# Patient Record
Sex: Female | Born: 1940
Health system: Southern US, Community
[De-identification: ages and names within clinical notes are randomized; demographics above are authoritative.]

## PROBLEM LIST (undated history)

## (undated) DIAGNOSIS — K7689 Other specified diseases of liver: Secondary | ICD-10-CM

## (undated) DIAGNOSIS — H269 Unspecified cataract: Secondary | ICD-10-CM

## (undated) DIAGNOSIS — S72001A Fracture of unspecified part of neck of right femur, initial encounter for closed fracture: Secondary | ICD-10-CM

## (undated) DIAGNOSIS — C801 Malignant (primary) neoplasm, unspecified: Secondary | ICD-10-CM

## (undated) DIAGNOSIS — Z9289 Personal history of other medical treatment: Secondary | ICD-10-CM

## (undated) DIAGNOSIS — T7840XA Allergy, unspecified, initial encounter: Secondary | ICD-10-CM

## (undated) DIAGNOSIS — I2510 Atherosclerotic heart disease of native coronary artery without angina pectoris: Secondary | ICD-10-CM

## (undated) DIAGNOSIS — I201 Angina pectoris with documented spasm: Secondary | ICD-10-CM

## (undated) DIAGNOSIS — Z9889 Other specified postprocedural states: Secondary | ICD-10-CM

## (undated) DIAGNOSIS — E785 Hyperlipidemia, unspecified: Secondary | ICD-10-CM

## (undated) DIAGNOSIS — I10 Essential (primary) hypertension: Secondary | ICD-10-CM

## (undated) DIAGNOSIS — K589 Irritable bowel syndrome without diarrhea: Secondary | ICD-10-CM

## (undated) DIAGNOSIS — F419 Anxiety disorder, unspecified: Secondary | ICD-10-CM

## (undated) DIAGNOSIS — G4700 Insomnia, unspecified: Secondary | ICD-10-CM

## (undated) DIAGNOSIS — K5730 Diverticulosis of large intestine without perforation or abscess without bleeding: Secondary | ICD-10-CM

## (undated) DIAGNOSIS — G373 Acute transverse myelitis in demyelinating disease of central nervous system: Secondary | ICD-10-CM

## (undated) DIAGNOSIS — K219 Gastro-esophageal reflux disease without esophagitis: Secondary | ICD-10-CM

## (undated) HISTORY — DX: Gastro-esophageal reflux disease without esophagitis: K21.9

## (undated) HISTORY — DX: Personal history of other medical treatment: Z92.89

## (undated) HISTORY — PX: COLONOSCOPY: SHX174

## (undated) HISTORY — DX: Essential (primary) hypertension: I10

## (undated) HISTORY — DX: Irritable bowel syndrome, unspecified: K58.9

## (undated) HISTORY — DX: Fracture of unspecified part of neck of right femur, initial encounter for closed fracture: S72.001A

## (undated) HISTORY — DX: Unspecified cataract: H26.9

## (undated) HISTORY — PX: UPPER GASTROINTESTINAL ENDOSCOPY: SHX188

## (undated) HISTORY — DX: Anxiety disorder, unspecified: F41.9

## (undated) HISTORY — DX: Angina pectoris with documented spasm: I20.1

## (undated) HISTORY — PX: LYSIS OF ADHESION: SHX5961

## (undated) HISTORY — DX: Allergy, unspecified, initial encounter: T78.40XA

## (undated) HISTORY — PX: ABDOMINAL HYSTERECTOMY: SHX81

## (undated) HISTORY — DX: Malignant (primary) neoplasm, unspecified: C80.1

## (undated) HISTORY — DX: Diverticulosis of large intestine without perforation or abscess without bleeding: K57.30

## (undated) HISTORY — DX: Other specified diseases of liver: K76.89

## (undated) HISTORY — DX: Atherosclerotic heart disease of native coronary artery without angina pectoris: I25.10

## (undated) HISTORY — DX: Hyperlipidemia, unspecified: E78.5

## (undated) HISTORY — DX: Insomnia, unspecified: G47.00

## (undated) HISTORY — PX: CARDIAC CATHETERIZATION: SHX172

## (undated) HISTORY — PX: SALPINGOOPHORECTOMY: SHX82

## (undated) HISTORY — DX: Acute transverse myelitis in demyelinating disease of central nervous system: G37.3

## (undated) HISTORY — PX: DIAGNOSTIC LAPAROSCOPY: SUR761

## (undated) HISTORY — DX: Other specified postprocedural states: Z98.890

---

## 1990-02-06 HISTORY — PX: PELVIC LAPAROSCOPY: SHX162

## 1998-08-12 ENCOUNTER — Other Ambulatory Visit: Admission: RE | Admit: 1998-08-12 | Discharge: 1998-08-12 | Payer: Self-pay | Admitting: Obstetrics and Gynecology

## 2000-11-06 ENCOUNTER — Encounter: Payer: Self-pay | Admitting: Emergency Medicine

## 2000-11-06 ENCOUNTER — Observation Stay (HOSPITAL_COMMUNITY): Admission: EM | Admit: 2000-11-06 | Discharge: 2000-11-07 | Payer: Self-pay | Admitting: Emergency Medicine

## 2001-01-31 ENCOUNTER — Observation Stay (HOSPITAL_COMMUNITY): Admission: EM | Admit: 2001-01-31 | Discharge: 2001-02-01 | Payer: Self-pay | Admitting: Emergency Medicine

## 2001-01-31 ENCOUNTER — Encounter: Payer: Self-pay | Admitting: *Deleted

## 2002-02-06 HISTORY — PX: RESECTION SOFT TISSUE TUMOR LEG / ANKLE RADICAL: SUR1250

## 2002-09-18 ENCOUNTER — Encounter: Payer: Self-pay | Admitting: Gynecology

## 2002-09-18 ENCOUNTER — Ambulatory Visit (HOSPITAL_COMMUNITY): Admission: RE | Admit: 2002-09-18 | Discharge: 2002-09-18 | Payer: Self-pay | Admitting: *Deleted

## 2002-11-07 ENCOUNTER — Encounter: Payer: Self-pay | Admitting: Gynecology

## 2002-11-07 ENCOUNTER — Ambulatory Visit (HOSPITAL_COMMUNITY): Admission: RE | Admit: 2002-11-07 | Discharge: 2002-11-07 | Payer: Self-pay | Admitting: Gynecology

## 2002-11-07 ENCOUNTER — Other Ambulatory Visit: Admission: RE | Admit: 2002-11-07 | Discharge: 2002-11-07 | Payer: Self-pay | Admitting: Gynecology

## 2002-11-10 ENCOUNTER — Encounter: Payer: Self-pay | Admitting: Gynecology

## 2002-11-10 ENCOUNTER — Ambulatory Visit (HOSPITAL_COMMUNITY): Admission: RE | Admit: 2002-11-10 | Discharge: 2002-11-10 | Payer: Self-pay | Admitting: Gynecology

## 2002-11-18 ENCOUNTER — Ambulatory Visit: Admission: RE | Admit: 2002-11-18 | Discharge: 2002-12-10 | Payer: Self-pay | Admitting: Radiation Oncology

## 2002-11-25 ENCOUNTER — Ambulatory Visit (HOSPITAL_COMMUNITY): Admission: RE | Admit: 2002-11-25 | Discharge: 2002-11-25 | Payer: Self-pay | Admitting: Hematology & Oncology

## 2002-11-25 ENCOUNTER — Encounter: Payer: Self-pay | Admitting: Hematology & Oncology

## 2002-11-26 ENCOUNTER — Encounter: Payer: Self-pay | Admitting: Hematology & Oncology

## 2002-11-26 ENCOUNTER — Ambulatory Visit (HOSPITAL_COMMUNITY): Admission: RE | Admit: 2002-11-26 | Discharge: 2002-11-26 | Payer: Self-pay | Admitting: Hematology & Oncology

## 2002-12-29 ENCOUNTER — Encounter: Payer: Self-pay | Admitting: Gastroenterology

## 2003-01-06 ENCOUNTER — Ambulatory Visit: Admission: RE | Admit: 2003-01-06 | Discharge: 2003-01-06 | Payer: Self-pay | Admitting: Radiation Oncology

## 2003-02-24 ENCOUNTER — Ambulatory Visit (HOSPITAL_COMMUNITY): Admission: RE | Admit: 2003-02-24 | Discharge: 2003-02-24 | Payer: Self-pay | Admitting: Hematology & Oncology

## 2003-03-18 ENCOUNTER — Emergency Department (HOSPITAL_COMMUNITY): Admission: EM | Admit: 2003-03-18 | Discharge: 2003-03-18 | Payer: Self-pay | Admitting: Family Medicine

## 2003-03-18 ENCOUNTER — Ambulatory Visit (HOSPITAL_COMMUNITY): Admission: RE | Admit: 2003-03-18 | Discharge: 2003-03-18 | Payer: Self-pay | Admitting: General Surgery

## 2003-03-18 ENCOUNTER — Encounter (INDEPENDENT_AMBULATORY_CARE_PROVIDER_SITE_OTHER): Payer: Self-pay | Admitting: Specialist

## 2003-03-18 ENCOUNTER — Ambulatory Visit (HOSPITAL_BASED_OUTPATIENT_CLINIC_OR_DEPARTMENT_OTHER): Admission: RE | Admit: 2003-03-18 | Discharge: 2003-03-18 | Payer: Self-pay | Admitting: General Surgery

## 2003-05-21 ENCOUNTER — Ambulatory Visit (HOSPITAL_COMMUNITY): Admission: RE | Admit: 2003-05-21 | Discharge: 2003-05-21 | Payer: Self-pay | Admitting: Hematology & Oncology

## 2003-08-24 ENCOUNTER — Ambulatory Visit (HOSPITAL_COMMUNITY): Admission: RE | Admit: 2003-08-24 | Discharge: 2003-08-24 | Payer: Self-pay | Admitting: Oncology

## 2003-10-20 ENCOUNTER — Ambulatory Visit (HOSPITAL_COMMUNITY): Admission: RE | Admit: 2003-10-20 | Discharge: 2003-10-20 | Payer: Self-pay | Admitting: Gynecology

## 2003-11-10 ENCOUNTER — Other Ambulatory Visit: Admission: RE | Admit: 2003-11-10 | Discharge: 2003-11-10 | Payer: Self-pay | Admitting: Gynecology

## 2003-11-27 ENCOUNTER — Ambulatory Visit (HOSPITAL_COMMUNITY): Admission: RE | Admit: 2003-11-27 | Discharge: 2003-11-27 | Payer: Self-pay | Admitting: Hematology & Oncology

## 2003-12-10 ENCOUNTER — Ambulatory Visit (HOSPITAL_COMMUNITY): Admission: RE | Admit: 2003-12-10 | Discharge: 2003-12-10 | Payer: Self-pay | Admitting: Hematology & Oncology

## 2003-12-28 ENCOUNTER — Ambulatory Visit: Payer: Self-pay | Admitting: Hematology & Oncology

## 2004-03-30 ENCOUNTER — Ambulatory Visit (HOSPITAL_COMMUNITY): Admission: RE | Admit: 2004-03-30 | Discharge: 2004-03-30 | Payer: Self-pay | Admitting: Hematology & Oncology

## 2004-04-22 ENCOUNTER — Ambulatory Visit: Payer: Self-pay | Admitting: Hematology & Oncology

## 2004-08-17 ENCOUNTER — Ambulatory Visit: Payer: Self-pay | Admitting: Hematology & Oncology

## 2004-08-22 ENCOUNTER — Ambulatory Visit (HOSPITAL_COMMUNITY): Admission: RE | Admit: 2004-08-22 | Discharge: 2004-08-22 | Payer: Self-pay | Admitting: Hematology & Oncology

## 2004-10-21 ENCOUNTER — Ambulatory Visit: Payer: Self-pay | Admitting: Hematology & Oncology

## 2004-11-01 ENCOUNTER — Ambulatory Visit (HOSPITAL_COMMUNITY): Admission: RE | Admit: 2004-11-01 | Discharge: 2004-11-01 | Payer: Self-pay | Admitting: Gynecology

## 2004-11-04 ENCOUNTER — Encounter: Admission: RE | Admit: 2004-11-04 | Discharge: 2004-11-04 | Payer: Self-pay | Admitting: Gynecology

## 2004-11-14 ENCOUNTER — Other Ambulatory Visit: Admission: RE | Admit: 2004-11-14 | Discharge: 2004-11-14 | Payer: Self-pay | Admitting: Gynecology

## 2004-12-11 ENCOUNTER — Emergency Department (HOSPITAL_COMMUNITY): Admission: EM | Admit: 2004-12-11 | Discharge: 2004-12-11 | Payer: Self-pay | Admitting: Emergency Medicine

## 2004-12-16 ENCOUNTER — Emergency Department (HOSPITAL_COMMUNITY): Admission: EM | Admit: 2004-12-16 | Discharge: 2004-12-17 | Payer: Self-pay | Admitting: Emergency Medicine

## 2004-12-19 ENCOUNTER — Ambulatory Visit (HOSPITAL_COMMUNITY): Admission: RE | Admit: 2004-12-19 | Discharge: 2004-12-19 | Payer: Self-pay | Admitting: Emergency Medicine

## 2004-12-22 ENCOUNTER — Ambulatory Visit (HOSPITAL_COMMUNITY): Admission: RE | Admit: 2004-12-22 | Discharge: 2004-12-22 | Payer: Self-pay | Admitting: Hematology & Oncology

## 2005-02-23 ENCOUNTER — Ambulatory Visit: Payer: Self-pay | Admitting: Hematology & Oncology

## 2005-03-13 ENCOUNTER — Ambulatory Visit (HOSPITAL_COMMUNITY): Admission: RE | Admit: 2005-03-13 | Discharge: 2005-03-13 | Payer: Self-pay | Admitting: Hematology & Oncology

## 2005-06-01 ENCOUNTER — Ambulatory Visit: Payer: Self-pay | Admitting: Gastroenterology

## 2005-06-16 ENCOUNTER — Ambulatory Visit: Payer: Self-pay | Admitting: Hematology & Oncology

## 2005-06-20 LAB — CBC WITH DIFFERENTIAL/PLATELET
BASO%: 1 % (ref 0.0–2.0)
Basophils Absolute: 0 10*3/uL (ref 0.0–0.1)
EOS%: 4.4 % (ref 0.0–7.0)
Eosinophils Absolute: 0.2 10*3/uL (ref 0.0–0.5)
HCT: 35.7 % (ref 34.8–46.6)
HGB: 11.8 g/dL (ref 11.6–15.9)
LYMPH%: 34.6 % (ref 14.0–48.0)
MCH: 31.3 pg (ref 26.0–34.0)
MCHC: 33 g/dL (ref 32.0–36.0)
MCV: 94.9 fL (ref 81.0–101.0)
MONO#: 0.3 10*3/uL (ref 0.1–0.9)
MONO%: 6.2 % (ref 0.0–13.0)
NEUT#: 2.7 10*3/uL (ref 1.5–6.5)
NEUT%: 53.8 % (ref 39.6–76.8)
Platelets: 461 10*3/uL — ABNORMAL HIGH (ref 145–400)
RBC: 3.76 10*6/uL (ref 3.70–5.32)
RDW: 14.3 % (ref 11.3–14.5)
WBC: 4.9 10*3/uL (ref 3.9–10.0)
lymph#: 1.7 10*3/uL (ref 0.9–3.3)

## 2005-06-20 LAB — COMPREHENSIVE METABOLIC PANEL
ALT: 10 U/L (ref 0–40)
AST: 17 U/L (ref 0–37)
Albumin: 4.1 g/dL (ref 3.5–5.2)
Alkaline Phosphatase: 67 U/L (ref 39–117)
BUN: 8 mg/dL (ref 6–23)
CO2: 28 mEq/L (ref 19–32)
Calcium: 9.6 mg/dL (ref 8.4–10.5)
Chloride: 106 mEq/L (ref 96–112)
Creatinine, Ser: 0.9 mg/dL (ref 0.4–1.2)
Glucose, Bld: 92 mg/dL (ref 70–99)
Potassium: 5 mEq/L (ref 3.5–5.3)
Sodium: 141 mEq/L (ref 135–145)
Total Bilirubin: 0.3 mg/dL (ref 0.3–1.2)
Total Protein: 7.3 g/dL (ref 6.0–8.3)

## 2005-06-20 LAB — CEA: CEA: 0.7 ng/mL (ref 0.0–5.0)

## 2005-06-20 LAB — CA 125: CA 125: 8.8 U/mL (ref 0.0–30.2)

## 2005-07-26 ENCOUNTER — Ambulatory Visit: Payer: Self-pay | Admitting: Gastroenterology

## 2005-07-28 ENCOUNTER — Encounter (INDEPENDENT_AMBULATORY_CARE_PROVIDER_SITE_OTHER): Payer: Self-pay | Admitting: Specialist

## 2005-07-28 ENCOUNTER — Ambulatory Visit: Payer: Self-pay | Admitting: Gastroenterology

## 2005-08-08 ENCOUNTER — Ambulatory Visit: Payer: Self-pay | Admitting: Gastroenterology

## 2005-09-07 ENCOUNTER — Ambulatory Visit: Payer: Self-pay | Admitting: Hematology & Oncology

## 2005-09-11 ENCOUNTER — Ambulatory Visit (HOSPITAL_COMMUNITY): Admission: RE | Admit: 2005-09-11 | Discharge: 2005-09-11 | Payer: Self-pay | Admitting: Hematology & Oncology

## 2005-11-02 ENCOUNTER — Encounter: Admission: RE | Admit: 2005-11-02 | Discharge: 2005-11-02 | Payer: Self-pay | Admitting: Gynecology

## 2005-11-09 ENCOUNTER — Encounter: Admission: RE | Admit: 2005-11-09 | Discharge: 2005-11-09 | Payer: Self-pay | Admitting: Gynecology

## 2005-11-16 ENCOUNTER — Other Ambulatory Visit: Admission: RE | Admit: 2005-11-16 | Discharge: 2005-11-16 | Payer: Self-pay | Admitting: Gynecology

## 2005-12-08 ENCOUNTER — Ambulatory Visit (HOSPITAL_COMMUNITY): Admission: RE | Admit: 2005-12-08 | Discharge: 2005-12-08 | Payer: Self-pay | Admitting: Gynecology

## 2005-12-20 ENCOUNTER — Ambulatory Visit: Admission: RE | Admit: 2005-12-20 | Discharge: 2005-12-20 | Payer: Self-pay | Admitting: Gynecologic Oncology

## 2006-01-02 ENCOUNTER — Ambulatory Visit (HOSPITAL_COMMUNITY): Admission: RE | Admit: 2006-01-02 | Discharge: 2006-01-02 | Payer: Self-pay | Admitting: Gynecologic Oncology

## 2006-01-02 ENCOUNTER — Encounter (INDEPENDENT_AMBULATORY_CARE_PROVIDER_SITE_OTHER): Payer: Self-pay | Admitting: Specialist

## 2006-03-07 ENCOUNTER — Ambulatory Visit: Payer: Self-pay | Admitting: Hematology & Oncology

## 2006-03-12 LAB — COMPREHENSIVE METABOLIC PANEL
ALT: 16 U/L (ref 0–35)
AST: 18 U/L (ref 0–37)
Albumin: 4.1 g/dL (ref 3.5–5.2)
Alkaline Phosphatase: 94 U/L (ref 39–117)
BUN: 9 mg/dL (ref 6–23)
CO2: 28 mEq/L (ref 19–32)
Calcium: 10.1 mg/dL (ref 8.4–10.5)
Chloride: 103 mEq/L (ref 96–112)
Creatinine, Ser: 0.95 mg/dL (ref 0.40–1.20)
Glucose, Bld: 98 mg/dL (ref 70–99)
Potassium: 4.4 mEq/L (ref 3.5–5.3)
Sodium: 137 mEq/L (ref 135–145)
Total Bilirubin: 0.2 mg/dL — ABNORMAL LOW (ref 0.3–1.2)
Total Protein: 7.3 g/dL (ref 6.0–8.3)

## 2006-03-12 LAB — CBC WITH DIFFERENTIAL/PLATELET
BASO%: 0.9 % (ref 0.0–2.0)
Basophils Absolute: 0 10*3/uL (ref 0.0–0.1)
EOS%: 4.9 % (ref 0.0–7.0)
Eosinophils Absolute: 0.3 10*3/uL (ref 0.0–0.5)
HCT: 33.5 % — ABNORMAL LOW (ref 34.8–46.6)
HGB: 11.5 g/dL — ABNORMAL LOW (ref 11.6–15.9)
LYMPH%: 49.1 % — ABNORMAL HIGH (ref 14.0–48.0)
MCH: 32 pg (ref 26.0–34.0)
MCHC: 34.3 g/dL (ref 32.0–36.0)
MCV: 93.2 fL (ref 81.0–101.0)
MONO#: 0.5 10*3/uL (ref 0.1–0.9)
MONO%: 10.2 % (ref 0.0–13.0)
NEUT#: 1.9 10*3/uL (ref 1.5–6.5)
NEUT%: 34.9 % — ABNORMAL LOW (ref 39.6–76.8)
Platelets: 406 10*3/uL — ABNORMAL HIGH (ref 145–400)
RBC: 3.6 10*6/uL — ABNORMAL LOW (ref 3.70–5.32)
RDW: 14.7 % — ABNORMAL HIGH (ref 11.3–14.5)
WBC: 5.4 10*3/uL (ref 3.9–10.0)
lymph#: 2.6 10*3/uL (ref 0.9–3.3)

## 2006-03-15 ENCOUNTER — Ambulatory Visit (HOSPITAL_COMMUNITY): Admission: RE | Admit: 2006-03-15 | Discharge: 2006-03-15 | Payer: Self-pay | Admitting: Hematology & Oncology

## 2006-05-08 ENCOUNTER — Ambulatory Visit: Payer: Self-pay | Admitting: Gastroenterology

## 2006-06-08 ENCOUNTER — Ambulatory Visit: Payer: Self-pay | Admitting: Gastroenterology

## 2006-06-08 LAB — CONVERTED CEMR LAB
Folate: >20 ng/mL
Vitamin B-12: 362 pg/mL (ref 211–911)

## 2006-09-05 ENCOUNTER — Ambulatory Visit: Payer: Self-pay | Admitting: Hematology & Oncology

## 2006-09-07 LAB — CBC WITH DIFFERENTIAL/PLATELET
BASO%: 0.6 % (ref 0.0–2.0)
Basophils Absolute: 0 10*3/uL (ref 0.0–0.1)
EOS%: 9.4 % — ABNORMAL HIGH (ref 0.0–7.0)
Eosinophils Absolute: 0.6 10*3/uL — ABNORMAL HIGH (ref 0.0–0.5)
HCT: 33.2 % — ABNORMAL LOW (ref 34.8–46.6)
HGB: 11.4 g/dL — ABNORMAL LOW (ref 11.6–15.9)
LYMPH%: 44 % (ref 14.0–48.0)
MCH: 31.7 pg (ref 26.0–34.0)
MCHC: 34.5 g/dL (ref 32.0–36.0)
MCV: 91.8 fL (ref 81.0–101.0)
MONO#: 0.6 10*3/uL (ref 0.1–0.9)
MONO%: 9.7 % (ref 0.0–13.0)
NEUT#: 2.2 10*3/uL (ref 1.5–6.5)
NEUT%: 36.3 % — ABNORMAL LOW (ref 39.6–76.8)
Platelets: 430 10*3/uL — ABNORMAL HIGH (ref 145–400)
RBC: 3.61 10*6/uL — ABNORMAL LOW (ref 3.70–5.32)
RDW: 13.9 % (ref 11.3–14.5)
WBC: 6.2 10*3/uL (ref 3.9–10.0)
lymph#: 2.7 10*3/uL (ref 0.9–3.3)

## 2006-09-07 LAB — COMPREHENSIVE METABOLIC PANEL
ALT: 13 U/L (ref 0–35)
AST: 18 U/L (ref 0–37)
Albumin: 4.1 g/dL (ref 3.5–5.2)
Alkaline Phosphatase: 110 U/L (ref 39–117)
BUN: 12 mg/dL (ref 6–23)
CO2: 25 mEq/L (ref 19–32)
Calcium: 9.5 mg/dL (ref 8.4–10.5)
Chloride: 103 mEq/L (ref 96–112)
Creatinine, Ser: 0.99 mg/dL (ref 0.40–1.20)
Glucose, Bld: 63 mg/dL — ABNORMAL LOW (ref 70–99)
Potassium: 4.4 mEq/L (ref 3.5–5.3)
Sodium: 139 mEq/L (ref 135–145)
Total Bilirubin: 0.3 mg/dL (ref 0.3–1.2)
Total Protein: 7.4 g/dL (ref 6.0–8.3)

## 2006-09-07 LAB — CANCER ANTIGEN 27.29: CA 27.29: 12 U/mL (ref 0–39)

## 2006-09-07 LAB — CA 125: CA 125: 9.7 U/mL (ref 0.0–30.2)

## 2006-09-10 ENCOUNTER — Encounter (INDEPENDENT_AMBULATORY_CARE_PROVIDER_SITE_OTHER): Payer: Self-pay | Admitting: *Deleted

## 2006-09-10 ENCOUNTER — Ambulatory Visit (HOSPITAL_COMMUNITY): Admission: RE | Admit: 2006-09-10 | Discharge: 2006-09-10 | Payer: Self-pay | Admitting: Hematology & Oncology

## 2006-10-02 ENCOUNTER — Ambulatory Visit (HOSPITAL_COMMUNITY): Admission: RE | Admit: 2006-10-02 | Discharge: 2006-10-02 | Payer: Self-pay | Admitting: Hematology & Oncology

## 2006-11-19 ENCOUNTER — Other Ambulatory Visit: Admission: RE | Admit: 2006-11-19 | Discharge: 2006-11-19 | Payer: Self-pay | Admitting: Gynecology

## 2006-12-04 ENCOUNTER — Encounter: Admission: RE | Admit: 2006-12-04 | Discharge: 2006-12-04 | Payer: Self-pay | Admitting: Gynecology

## 2007-03-18 ENCOUNTER — Ambulatory Visit: Payer: Self-pay | Admitting: Hematology & Oncology

## 2007-03-20 LAB — COMPREHENSIVE METABOLIC PANEL
ALT: 14 U/L (ref 0–35)
AST: 16 U/L (ref 0–37)
Albumin: 4.3 g/dL (ref 3.5–5.2)
Alkaline Phosphatase: 127 U/L — ABNORMAL HIGH (ref 39–117)
BUN: 10 mg/dL (ref 6–23)
CO2: 28 mEq/L (ref 19–32)
Calcium: 10 mg/dL (ref 8.4–10.5)
Chloride: 103 mEq/L (ref 96–112)
Creatinine, Ser: 1.05 mg/dL (ref 0.40–1.20)
Glucose, Bld: 74 mg/dL (ref 70–99)
Potassium: 4.3 mEq/L (ref 3.5–5.3)
Sodium: 140 mEq/L (ref 135–145)
Total Bilirubin: 0.4 mg/dL (ref 0.3–1.2)
Total Protein: 7.6 g/dL (ref 6.0–8.3)

## 2007-03-20 LAB — CBC WITH DIFFERENTIAL/PLATELET
BASO%: 0.7 % (ref 0.0–2.0)
Basophils Absolute: 0 10*3/uL (ref 0.0–0.1)
EOS%: 5.8 % (ref 0.0–7.0)
Eosinophils Absolute: 0.4 10*3/uL (ref 0.0–0.5)
HCT: 34.3 % — ABNORMAL LOW (ref 34.8–46.6)
HGB: 11.7 g/dL (ref 11.6–15.9)
LYMPH%: 37 % (ref 14.0–48.0)
MCH: 31.5 pg (ref 26.0–34.0)
MCHC: 34.2 g/dL (ref 32.0–36.0)
MCV: 92.2 fL (ref 81.0–101.0)
MONO#: 0.5 10*3/uL (ref 0.1–0.9)
MONO%: 7.8 % (ref 0.0–13.0)
NEUT#: 3 10*3/uL (ref 1.5–6.5)
NEUT%: 48.7 % (ref 39.6–76.8)
Platelets: 442 10*3/uL — ABNORMAL HIGH (ref 145–400)
RBC: 3.72 10*6/uL (ref 3.70–5.32)
RDW: 14.1 % (ref 11.3–14.5)
WBC: 6.1 10*3/uL (ref 3.9–10.0)
lymph#: 2.3 10*3/uL (ref 0.9–3.3)

## 2007-08-16 ENCOUNTER — Ambulatory Visit: Payer: Self-pay | Admitting: Hematology & Oncology

## 2007-08-16 LAB — BASIC METABOLIC PANEL
BUN: 15 mg/dL (ref 6–23)
CO2: 28 mEq/L (ref 19–32)
Calcium: 9.7 mg/dL (ref 8.4–10.5)
Chloride: 104 mEq/L (ref 96–112)
Creatinine, Ser: 0.97 mg/dL (ref 0.40–1.20)
Glucose, Bld: 92 mg/dL (ref 70–99)
Potassium: 4 mEq/L (ref 3.5–5.3)
Sodium: 141 mEq/L (ref 135–145)

## 2007-08-19 ENCOUNTER — Ambulatory Visit (HOSPITAL_COMMUNITY): Admission: RE | Admit: 2007-08-19 | Discharge: 2007-08-19 | Payer: Self-pay | Admitting: Hematology & Oncology

## 2007-10-23 ENCOUNTER — Ambulatory Visit: Payer: Self-pay | Admitting: Hematology & Oncology

## 2007-10-24 ENCOUNTER — Encounter: Payer: Self-pay | Admitting: Gastroenterology

## 2007-10-24 LAB — COMPREHENSIVE METABOLIC PANEL
ALT: 22 U/L (ref 0–35)
AST: 25 U/L (ref 0–37)
Albumin: 4.2 g/dL (ref 3.5–5.2)
Alkaline Phosphatase: 136 U/L — ABNORMAL HIGH (ref 39–117)
BUN: 9 mg/dL (ref 6–23)
CO2: 28 mEq/L (ref 19–32)
Calcium: 9.8 mg/dL (ref 8.4–10.5)
Chloride: 102 mEq/L (ref 96–112)
Creatinine, Ser: 0.97 mg/dL (ref 0.40–1.20)
Glucose, Bld: 92 mg/dL (ref 70–99)
Potassium: 4.2 mEq/L (ref 3.5–5.3)
Sodium: 137 mEq/L (ref 135–145)
Total Bilirubin: 0.3 mg/dL (ref 0.3–1.2)
Total Protein: 7.7 g/dL (ref 6.0–8.3)

## 2007-10-24 LAB — CBC WITH DIFFERENTIAL (CANCER CENTER ONLY)
BASO#: 0 10*3/uL (ref 0.0–0.2)
BASO%: 0.7 % (ref 0.0–2.0)
EOS%: 3.4 % (ref 0.0–7.0)
Eosinophils Absolute: 0.2 10*3/uL (ref 0.0–0.5)
HCT: 35.2 % (ref 34.8–46.6)
HGB: 11.9 g/dL (ref 11.6–15.9)
LYMPH#: 2.1 10*3/uL (ref 0.9–3.3)
LYMPH%: 37.8 % (ref 14.0–48.0)
MCH: 30 pg (ref 26.0–34.0)
MCHC: 33.7 g/dL (ref 32.0–36.0)
MCV: 89 fL (ref 81–101)
MONO#: 0.6 10*3/uL (ref 0.1–0.9)
MONO%: 10.7 % (ref 0.0–13.0)
NEUT#: 2.6 10*3/uL (ref 1.5–6.5)
NEUT%: 47.4 % (ref 39.6–80.0)
Platelets: 405 10*3/uL — ABNORMAL HIGH (ref 145–400)
RBC: 3.95 10*6/uL (ref 3.70–5.32)
RDW: 12.9 % (ref 10.5–14.6)
WBC: 5.5 10*3/uL (ref 3.9–10.0)

## 2007-11-21 ENCOUNTER — Other Ambulatory Visit: Admission: RE | Admit: 2007-11-21 | Discharge: 2007-11-21 | Payer: Self-pay | Admitting: Gynecology

## 2007-11-21 ENCOUNTER — Ambulatory Visit: Payer: Self-pay | Admitting: Gynecology

## 2007-11-21 ENCOUNTER — Encounter: Payer: Self-pay | Admitting: Gynecology

## 2007-12-05 ENCOUNTER — Encounter: Admission: RE | Admit: 2007-12-05 | Discharge: 2007-12-05 | Payer: Self-pay | Admitting: Gynecology

## 2008-04-27 ENCOUNTER — Ambulatory Visit: Payer: Self-pay | Admitting: Hematology & Oncology

## 2008-05-07 ENCOUNTER — Encounter: Payer: Self-pay | Admitting: Gastroenterology

## 2008-05-07 LAB — COMPREHENSIVE METABOLIC PANEL
ALT: 13 U/L (ref 0–35)
AST: 18 U/L (ref 0–37)
Albumin: 4.3 g/dL (ref 3.5–5.2)
Alkaline Phosphatase: 116 U/L (ref 39–117)
BUN: 9 mg/dL (ref 6–23)
CO2: 28 mEq/L (ref 19–32)
Calcium: 9.6 mg/dL (ref 8.4–10.5)
Chloride: 100 mEq/L (ref 96–112)
Creatinine, Ser: 0.94 mg/dL (ref 0.40–1.20)
Glucose, Bld: 70 mg/dL (ref 70–99)
Potassium: 4.3 mEq/L (ref 3.5–5.3)
Sodium: 136 mEq/L (ref 135–145)
Total Bilirubin: 0.3 mg/dL (ref 0.3–1.2)
Total Protein: 7.6 g/dL (ref 6.0–8.3)

## 2008-05-07 LAB — CBC WITH DIFFERENTIAL (CANCER CENTER ONLY)
BASO#: 0 10*3/uL (ref 0.0–0.2)
BASO%: 0.6 % (ref 0.0–2.0)
EOS%: 7.9 % — ABNORMAL HIGH (ref 0.0–7.0)
Eosinophils Absolute: 0.4 10*3/uL (ref 0.0–0.5)
HCT: 36.4 % (ref 34.8–46.6)
HGB: 11.7 g/dL (ref 11.6–15.9)
LYMPH#: 2.8 10*3/uL (ref 0.9–3.3)
LYMPH%: 55.5 % — ABNORMAL HIGH (ref 14.0–48.0)
MCH: 30.2 pg (ref 26.0–34.0)
MCHC: 32.2 g/dL (ref 32.0–36.0)
MCV: 94 fL (ref 81–101)
MONO#: 0.3 10*3/uL (ref 0.1–0.9)
MONO%: 6.1 % (ref 0.0–13.0)
NEUT#: 1.5 10*3/uL (ref 1.5–6.5)
NEUT%: 29.9 % — ABNORMAL LOW (ref 39.6–80.0)
Platelets: 407 10*3/uL — ABNORMAL HIGH (ref 145–400)
RBC: 3.88 10*6/uL (ref 3.70–5.32)
RDW: 12.3 % (ref 10.5–14.6)
WBC: 5.1 10*3/uL (ref 3.9–10.0)

## 2008-05-07 LAB — CEA: CEA: 2.5 ng/mL (ref 0.0–5.0)

## 2008-07-26 ENCOUNTER — Encounter (INDEPENDENT_AMBULATORY_CARE_PROVIDER_SITE_OTHER): Payer: Self-pay | Admitting: Emergency Medicine

## 2008-07-26 ENCOUNTER — Ambulatory Visit: Payer: Self-pay | Admitting: *Deleted

## 2008-07-26 ENCOUNTER — Ambulatory Visit (HOSPITAL_COMMUNITY): Admission: RE | Admit: 2008-07-26 | Discharge: 2008-07-26 | Payer: Self-pay | Admitting: Emergency Medicine

## 2008-07-26 ENCOUNTER — Emergency Department (HOSPITAL_COMMUNITY): Admission: EM | Admit: 2008-07-26 | Discharge: 2008-07-26 | Payer: Self-pay | Admitting: Emergency Medicine

## 2008-08-07 ENCOUNTER — Encounter: Admission: RE | Admit: 2008-08-07 | Discharge: 2008-08-07 | Payer: Self-pay | Admitting: *Deleted

## 2008-11-05 ENCOUNTER — Ambulatory Visit: Payer: Self-pay | Admitting: Hematology & Oncology

## 2008-11-06 ENCOUNTER — Encounter: Payer: Self-pay | Admitting: Gastroenterology

## 2008-11-06 LAB — COMPREHENSIVE METABOLIC PANEL
ALT: 15 U/L (ref 0–35)
AST: 16 U/L (ref 0–37)
Albumin: 4.2 g/dL (ref 3.5–5.2)
Alkaline Phosphatase: 100 U/L (ref 39–117)
BUN: 9 mg/dL (ref 6–23)
CO2: 28 mEq/L (ref 19–32)
Calcium: 9.3 mg/dL (ref 8.4–10.5)
Chloride: 104 mEq/L (ref 96–112)
Creatinine, Ser: 0.99 mg/dL (ref 0.40–1.20)
Glucose, Bld: 82 mg/dL (ref 70–99)
Potassium: 4.4 mEq/L (ref 3.5–5.3)
Sodium: 139 mEq/L (ref 135–145)
Total Bilirubin: 0.4 mg/dL (ref 0.3–1.2)
Total Protein: 7.3 g/dL (ref 6.0–8.3)

## 2008-11-06 LAB — CBC WITH DIFFERENTIAL (CANCER CENTER ONLY)
BASO#: 0 10*3/uL (ref 0.0–0.2)
BASO%: 0.7 % (ref 0.0–2.0)
EOS%: 7.9 % — ABNORMAL HIGH (ref 0.0–7.0)
Eosinophils Absolute: 0.5 10*3/uL (ref 0.0–0.5)
HCT: 34 % — ABNORMAL LOW (ref 34.8–46.6)
HGB: 11.6 g/dL (ref 11.6–15.9)
LYMPH#: 2.6 10*3/uL (ref 0.9–3.3)
LYMPH%: 42.8 % (ref 14.0–48.0)
MCH: 31.8 pg (ref 26.0–34.0)
MCHC: 34.3 g/dL (ref 32.0–36.0)
MCV: 93 fL (ref 81–101)
MONO#: 0.4 10*3/uL (ref 0.1–0.9)
MONO%: 6.3 % (ref 0.0–13.0)
NEUT#: 2.5 10*3/uL (ref 1.5–6.5)
NEUT%: 42.3 % (ref 39.6–80.0)
Platelets: 422 10*3/uL — ABNORMAL HIGH (ref 145–400)
RBC: 3.66 10*6/uL — ABNORMAL LOW (ref 3.70–5.32)
RDW: 12 % (ref 10.5–14.6)
WBC: 6 10*3/uL (ref 3.9–10.0)

## 2008-11-06 LAB — LACTATE DEHYDROGENASE: LDH: 133 U/L (ref 94–250)

## 2008-11-23 ENCOUNTER — Encounter: Payer: Self-pay | Admitting: Gynecology

## 2008-11-23 ENCOUNTER — Other Ambulatory Visit: Admission: RE | Admit: 2008-11-23 | Discharge: 2008-11-23 | Payer: Self-pay | Admitting: Gynecology

## 2008-11-23 ENCOUNTER — Ambulatory Visit: Payer: Self-pay | Admitting: Gynecology

## 2008-12-01 ENCOUNTER — Ambulatory Visit: Payer: Self-pay | Admitting: Gynecology

## 2008-12-07 ENCOUNTER — Encounter: Admission: RE | Admit: 2008-12-07 | Discharge: 2008-12-07 | Payer: Self-pay | Admitting: Gynecology

## 2008-12-23 ENCOUNTER — Ambulatory Visit: Payer: Self-pay | Admitting: Gynecology

## 2009-01-19 ENCOUNTER — Ambulatory Visit: Payer: Self-pay | Admitting: Gynecology

## 2009-02-14 ENCOUNTER — Emergency Department (HOSPITAL_COMMUNITY): Admission: EM | Admit: 2009-02-14 | Discharge: 2009-02-15 | Payer: Self-pay | Admitting: Emergency Medicine

## 2009-02-22 ENCOUNTER — Ambulatory Visit (HOSPITAL_COMMUNITY): Admission: RE | Admit: 2009-02-22 | Discharge: 2009-02-22 | Payer: Self-pay | Admitting: Cardiovascular Disease

## 2009-02-24 ENCOUNTER — Ambulatory Visit (HOSPITAL_COMMUNITY): Admission: RE | Admit: 2009-02-24 | Discharge: 2009-02-24 | Payer: Self-pay | Admitting: Cardiovascular Disease

## 2009-05-06 ENCOUNTER — Ambulatory Visit: Payer: Self-pay | Admitting: Hematology & Oncology

## 2009-05-07 LAB — COMPREHENSIVE METABOLIC PANEL
ALT: 12 U/L (ref 0–35)
AST: 15 U/L (ref 0–37)
Albumin: 4.4 g/dL (ref 3.5–5.2)
Alkaline Phosphatase: 88 U/L (ref 39–117)
BUN: 13 mg/dL (ref 6–23)
CO2: 29 mEq/L (ref 19–32)
Calcium: 9.7 mg/dL (ref 8.4–10.5)
Chloride: 101 mEq/L (ref 96–112)
Creatinine, Ser: 0.98 mg/dL (ref 0.40–1.20)
Glucose, Bld: 91 mg/dL (ref 70–99)
Potassium: 4.5 mEq/L (ref 3.5–5.3)
Sodium: 139 mEq/L (ref 135–145)
Total Bilirubin: 0.3 mg/dL (ref 0.3–1.2)
Total Protein: 7.5 g/dL (ref 6.0–8.3)

## 2009-05-07 LAB — CBC WITH DIFFERENTIAL (CANCER CENTER ONLY)
BASO#: 0 10*3/uL (ref 0.0–0.2)
BASO%: 0.6 % (ref 0.0–2.0)
EOS%: 4.3 % (ref 0.0–7.0)
Eosinophils Absolute: 0.2 10*3/uL (ref 0.0–0.5)
HCT: 35.7 % (ref 34.8–46.6)
HGB: 11.8 g/dL (ref 11.6–15.9)
LYMPH#: 2.2 10*3/uL (ref 0.9–3.3)
LYMPH%: 44.3 % (ref 14.0–48.0)
MCH: 30.6 pg (ref 26.0–34.0)
MCHC: 33 g/dL (ref 32.0–36.0)
MCV: 93 fL (ref 81–101)
MONO#: 0.4 10*3/uL (ref 0.1–0.9)
MONO%: 7.1 % (ref 0.0–13.0)
NEUT#: 2.2 10*3/uL (ref 1.5–6.5)
NEUT%: 43.7 % (ref 39.6–80.0)
Platelets: 419 10*3/uL — ABNORMAL HIGH (ref 145–400)
RBC: 3.86 10*6/uL (ref 3.70–5.32)
RDW: 12.3 % (ref 10.5–14.6)
WBC: 5 10*3/uL (ref 3.9–10.0)

## 2009-05-07 LAB — VITAMIN D 25 HYDROXY (VIT D DEFICIENCY, FRACTURES): Vit D, 25-Hydroxy: 22 ng/mL — ABNORMAL LOW (ref 30–89)

## 2009-08-07 ENCOUNTER — Encounter (INDEPENDENT_AMBULATORY_CARE_PROVIDER_SITE_OTHER): Payer: Self-pay | Admitting: *Deleted

## 2009-11-10 ENCOUNTER — Ambulatory Visit: Payer: Self-pay | Admitting: Hematology & Oncology

## 2009-11-12 ENCOUNTER — Encounter: Payer: Self-pay | Admitting: Gastroenterology

## 2009-11-12 ENCOUNTER — Telehealth: Payer: Self-pay | Admitting: Gastroenterology

## 2009-11-12 LAB — CBC WITH DIFFERENTIAL (CANCER CENTER ONLY)
BASO#: 0 10*3/uL (ref 0.0–0.2)
BASO%: 0.5 % (ref 0.0–2.0)
EOS%: 2.2 % (ref 0.0–7.0)
Eosinophils Absolute: 0.1 10*3/uL (ref 0.0–0.5)
HCT: 35.8 % (ref 34.8–46.6)
HGB: 11.7 g/dL (ref 11.6–15.9)
LYMPH#: 2.5 10*3/uL (ref 0.9–3.3)
LYMPH%: 39.2 % (ref 14.0–48.0)
MCH: 30.4 pg (ref 26.0–34.0)
MCHC: 32.6 g/dL (ref 32.0–36.0)
MCV: 93 fL (ref 81–101)
MONO#: 0.5 10*3/uL (ref 0.1–0.9)
MONO%: 8 % (ref 0.0–13.0)
NEUT#: 3.2 10*3/uL (ref 1.5–6.5)
NEUT%: 50.1 % (ref 39.6–80.0)
Platelets: 485 10*3/uL — ABNORMAL HIGH (ref 145–400)
RBC: 3.84 10*6/uL (ref 3.70–5.32)
RDW: 12.3 % (ref 10.5–14.6)
WBC: 6.3 10*3/uL (ref 3.9–10.0)

## 2009-11-12 LAB — VITAMIN D 25 HYDROXY (VIT D DEFICIENCY, FRACTURES): Vit D, 25-Hydroxy: 48 ng/mL (ref 30–89)

## 2009-11-12 LAB — COMPREHENSIVE METABOLIC PANEL
ALT: 13 U/L (ref 0–35)
AST: 16 U/L (ref 0–37)
Albumin: 4.2 g/dL (ref 3.5–5.2)
Alkaline Phosphatase: 80 U/L (ref 39–117)
BUN: 11 mg/dL (ref 6–23)
CO2: 30 mEq/L (ref 19–32)
Calcium: 9.8 mg/dL (ref 8.4–10.5)
Chloride: 99 mEq/L (ref 96–112)
Creatinine, Ser: 1.01 mg/dL (ref 0.40–1.20)
Glucose, Bld: 81 mg/dL (ref 70–99)
Potassium: 4.2 mEq/L (ref 3.5–5.3)
Sodium: 138 mEq/L (ref 135–145)
Total Bilirubin: 0.6 mg/dL (ref 0.3–1.2)
Total Protein: 7 g/dL (ref 6.0–8.3)

## 2009-11-24 ENCOUNTER — Other Ambulatory Visit: Admission: RE | Admit: 2009-11-24 | Discharge: 2009-11-24 | Payer: Self-pay | Admitting: Gynecology

## 2009-11-24 ENCOUNTER — Ambulatory Visit: Payer: Self-pay | Admitting: Gynecology

## 2009-12-02 DIAGNOSIS — K5730 Diverticulosis of large intestine without perforation or abscess without bleeding: Secondary | ICD-10-CM | POA: Insufficient documentation

## 2009-12-02 DIAGNOSIS — R197 Diarrhea, unspecified: Secondary | ICD-10-CM | POA: Insufficient documentation

## 2009-12-02 DIAGNOSIS — C801 Malignant (primary) neoplasm, unspecified: Secondary | ICD-10-CM | POA: Insufficient documentation

## 2009-12-02 DIAGNOSIS — I200 Unstable angina: Secondary | ICD-10-CM | POA: Insufficient documentation

## 2009-12-02 DIAGNOSIS — K589 Irritable bowel syndrome without diarrhea: Secondary | ICD-10-CM | POA: Insufficient documentation

## 2009-12-02 DIAGNOSIS — K7689 Other specified diseases of liver: Secondary | ICD-10-CM | POA: Insufficient documentation

## 2009-12-03 ENCOUNTER — Encounter (INDEPENDENT_AMBULATORY_CARE_PROVIDER_SITE_OTHER): Payer: Self-pay | Admitting: *Deleted

## 2009-12-03 ENCOUNTER — Ambulatory Visit: Payer: Self-pay | Admitting: Gastroenterology

## 2009-12-03 DIAGNOSIS — I2510 Atherosclerotic heart disease of native coronary artery without angina pectoris: Secondary | ICD-10-CM | POA: Insufficient documentation

## 2009-12-03 DIAGNOSIS — K219 Gastro-esophageal reflux disease without esophagitis: Secondary | ICD-10-CM | POA: Insufficient documentation

## 2009-12-08 ENCOUNTER — Ambulatory Visit: Payer: Self-pay | Admitting: Gastroenterology

## 2009-12-08 ENCOUNTER — Encounter (INDEPENDENT_AMBULATORY_CARE_PROVIDER_SITE_OTHER): Payer: Self-pay | Admitting: *Deleted

## 2009-12-08 LAB — CONVERTED CEMR LAB: UREASE: NEGATIVE

## 2009-12-09 ENCOUNTER — Encounter (INDEPENDENT_AMBULATORY_CARE_PROVIDER_SITE_OTHER): Payer: Self-pay | Admitting: *Deleted

## 2009-12-13 ENCOUNTER — Ambulatory Visit: Payer: Self-pay | Admitting: Gastroenterology

## 2009-12-20 ENCOUNTER — Encounter: Admission: RE | Admit: 2009-12-20 | Discharge: 2009-12-20 | Payer: Self-pay | Admitting: Gynecology

## 2009-12-24 ENCOUNTER — Ambulatory Visit: Payer: Self-pay | Admitting: Gastroenterology

## 2010-01-12 ENCOUNTER — Encounter: Payer: Self-pay | Admitting: Gastroenterology

## 2010-01-12 ENCOUNTER — Ambulatory Visit: Payer: Self-pay | Admitting: Hematology & Oncology

## 2010-01-12 LAB — COMPREHENSIVE METABOLIC PANEL
ALT: 12 U/L (ref 0–35)
AST: 17 U/L (ref 0–37)
Albumin: 4.1 g/dL (ref 3.5–5.2)
Alkaline Phosphatase: 84 U/L (ref 39–117)
BUN: 7 mg/dL (ref 6–23)
CO2: 29 mEq/L (ref 19–32)
Calcium: 9.5 mg/dL (ref 8.4–10.5)
Chloride: 103 mEq/L (ref 96–112)
Creatinine, Ser: 0.9 mg/dL (ref 0.40–1.20)
Glucose, Bld: 97 mg/dL (ref 70–99)
Potassium: 4.2 mEq/L (ref 3.5–5.3)
Sodium: 139 mEq/L (ref 135–145)
Total Bilirubin: 0.4 mg/dL (ref 0.3–1.2)
Total Protein: 6.9 g/dL (ref 6.0–8.3)

## 2010-01-12 LAB — CBC WITH DIFFERENTIAL (CANCER CENTER ONLY)
BASO#: 0 10*3/uL (ref 0.0–0.2)
BASO%: 0.4 % (ref 0.0–2.0)
EOS%: 7 % (ref 0.0–7.0)
Eosinophils Absolute: 0.3 10*3/uL (ref 0.0–0.5)
HCT: 33.9 % — ABNORMAL LOW (ref 34.8–46.6)
HGB: 11.2 g/dL — ABNORMAL LOW (ref 11.6–15.9)
LYMPH#: 1.8 10*3/uL (ref 0.9–3.3)
LYMPH%: 37.8 % (ref 14.0–48.0)
MCH: 30.8 pg (ref 26.0–34.0)
MCHC: 33.1 g/dL (ref 32.0–36.0)
MCV: 93 fL (ref 81–101)
MONO#: 0.3 10*3/uL (ref 0.1–0.9)
MONO%: 6.1 % (ref 0.0–13.0)
NEUT#: 2.3 10*3/uL (ref 1.5–6.5)
NEUT%: 48.7 % (ref 39.6–80.0)
Platelets: 404 10*3/uL — ABNORMAL HIGH (ref 145–400)
RBC: 3.64 10*6/uL — ABNORMAL LOW (ref 3.70–5.32)
RDW: 12.5 % (ref 10.5–14.6)
WBC: 4.7 10*3/uL (ref 3.9–10.0)

## 2010-01-24 ENCOUNTER — Encounter: Payer: Self-pay | Admitting: Gastroenterology

## 2010-01-25 ENCOUNTER — Ambulatory Visit (HOSPITAL_COMMUNITY)
Admission: RE | Admit: 2010-01-25 | Discharge: 2010-01-25 | Payer: Self-pay | Source: Home / Self Care | Attending: Hematology & Oncology | Admitting: Hematology & Oncology

## 2010-02-11 ENCOUNTER — Ambulatory Visit: Payer: Self-pay | Admitting: Hematology & Oncology

## 2010-02-11 ENCOUNTER — Encounter: Payer: Self-pay | Admitting: Gastroenterology

## 2010-02-27 ENCOUNTER — Encounter: Payer: Self-pay | Admitting: Cardiovascular Disease

## 2010-02-27 ENCOUNTER — Encounter: Payer: Self-pay | Admitting: Gynecology

## 2010-03-08 NOTE — Letter (Signed)
Summary: [facility]  [facility]   Imported By: Lester Little [place] 12/07/2009 09:11:30  _____________________________________________________________________  External Attachment:    Type:   Image     Comment:   External Document

## 2010-03-08 NOTE — Procedures (Signed)
Summary: Endoscopy   EGD  Procedure date:  12/29/2002  Findings:      Location: [facility]   Patient Name: Amy Black, Amy Black. MRN:  Procedure Procedures: Panendoscopy (EGD) CPT: 43235.  Personnel: Endoscopist: Vania Rea. Jarold Motto, MD.  Exam Location: Exam performed in Outpatient Clinic. Outpatient  Patient Consent: Procedure, Alternatives, Risks and Benefits discussed, consent obtained, from patient. Consent was obtained by the RN.  Indications  Surveillance of: Metstatic Ca to skin from ??? primary site.  History  Current Medications: Patient is not currently taking Coumadin.  Pre-Exam Physical: Cardio-pulmonary exam, Abdominal exam, Extremity exam, Mental status exam WNL.  Exam Exam Info: Maximum depth of insertion Duodenum, intended Duodenum. Patient position: on left side. Duration of exam: 15 minutes. Vocal cords visualized. Gastric retroflexion performed. Images taken. ASA Classification: I. Tolerance: excellent.  Sedation Meds: Patient assessed and found to be appropriate for moderate (conscious) sedation. Cetacaine Spray 2 sprays given aerosolized. Versed 2 mg. given IV.  Monitoring: BP and pulse monitoring done. Oximetry used. Supplemental O2 given at 2 Liters.  Instrument(s): GIF 160. Serial [id number].   Findings - Normal: Proximal Esophagus to Duodenal 2nd Portion. Not Seen: Tumor. Ulcer. Barrett's esophagus. Mucosal abnormality. Polyp. Varices.   Assessment Normal examination.  Events  Unplanned Intervention: No unplanned interventions were required.  Plans Medication(s): Continue current medications.  Disposition: After procedure patient sent to recovery. After recovery patient sent home.  Scheduling: Follow-up prn.    CC: Lonzo Cloud. Kriste Basque, MD     Gaetano Hawthorne. Lily Peer, MD   This report was created from the original endoscopy report, which was reviewed and signed by the above listed endoscopist.

## 2010-03-08 NOTE — Procedures (Signed)
Summary: Colonoscopy  Patient: Amy Black Note: All result statuses are Final unless otherwise noted.  Tests: (1) Colonoscopy (COL)   COL Colonoscopy           DONE     [facility]     [address]     [place], [place]  [address]           COLONOSCOPY PROCEDURE REPORT           PATIENT:  Amy, Black  MR#:  [id number]     BIRTHDATE:  07-31-1940, 69 yrs. old  GENDER:  female     ENDOSCOPIST:  Vania Rea. Jarold Motto, MD, [facility]     REF. BY:  Rudi Heap, M.D.     PROCEDURE DATE:  12/24/2009     PROCEDURE:  Average-risk screening colonoscopy     G0121     ASA CLASS:  Class II     INDICATIONS:  Routine Risk Screening metastatic adenosquamous CA     TO SHIN AREA.??? PRIMARY SITE.HX OF CHRONIC DIARRHEA.     MEDICATIONS:   Fentanyl 100 mcg IV, Versed 10 mg IV           DESCRIPTION OF PROCEDURE:   After the risks benefits and     alternatives of the procedure were thoroughly explained, informed     consent was obtained.  Digital rectal exam was performed and     revealed no abnormalities.   The LB 180AL [id number] endoscope was     introduced through the anus and advanced to the cecum, which was     identified by both the appendix and ileocecal valve, without     limitations.  The quality of the prep was Moviprep fair.  The     instrument was then slowly withdrawn as the colon was fully     examined.<<PROCEDUREIMAGES>>           FINDINGS:  Moderate diverticulosis was found in the sigmoid to     descending colon segments.  No polyps or cancers were seen.  no     active bleeding or blood in c.   Retroflexed views in the rectum     revealed hypertrophied anal papillae.    The scope was then     withdrawn from the patient and the procedure completed.           COMPLICATIONS:  None     ENDOSCOPIC IMPRESSION:     1) Moderate diverticulosis in the sigmoid to descending colon     segments     2) No polyps or cancers     3) No active bleeding or blood in c     4) Hypertrophied  anal papillae     PROBABLE PREDOMINANT IBS.     RECOMMENDATIONS:     1) Continue current medications     REPEAT EXAM:  No           ______________________________     Vania Rea. Jarold Motto, MD, Clementeen Graham           CC:  Arlan Organ, MD           n.     Rosalie Doctor:   Vania Rea. Patterson at 12/24/2009 04:57 PM           [place], [place], [place] [id number]  Note: An exclamation mark (!) indicates a result that was not dispersed into the flowsheet. Document Creation Date: 12/24/2009 4:57 PM _______________________________________________________________________  (1) Order result status: Final Collection or observation date-time: 12/24/2009  16:50 Requested date-time:  Receipt date-time:  Reported date-time:  Referring Physician:   Ordering Physician: Sheryn Bison [phone]) Specimen Source:  Source: Launa Grill Order Number: [id number] Lab site:

## 2010-03-08 NOTE — Procedures (Signed)
Summary: Colonoscopy & Pathology   Colonoscopy  Procedure date:  07/28/2005  Findings:      Location:  [facility].   Patient Name: Amy Black, Amy Black. MRN:  Procedure Procedures: Colonoscopy CPT: [phone].  Personnel: Endoscopist: Vania Rea. Jarold Motto, MD.  Exam Location: Exam performed in Outpatient Clinic. Outpatient  Patient Consent: Procedure, Alternatives, Risks and Benefits discussed, consent obtained, from patient. Consent was obtained by the RN.  Indications Symptoms: Diarrhea  History  Current Medications: Patient is not currently taking Coumadin.  Pre-Exam Physical: Performed Jul 28, 2005. Cardio-pulmonary exam, Rectal exam, Abdominal exam, Extremity exam, Mental status exam WNL.  Comments: Pt. history reviewed/updated, physical exam performed prior to initiation of sedation?yes Exam Exam: Extent of exam reached: Terminal Ileum, extent intended: Terminal Ileum.  The cecum was identified by appendiceal orifice and IC valve. Patient position: on left side. The Cecum was reached at 2:12 PM. ended at 2:20 PM. Time for Withdrawl: 00:08. Colon retroflexion performed. Images taken. ASA Classification: II. Tolerance: excellent.  Monitoring: Pulse and BP monitoring, Oximetry used. Supplemental O2 given. at 2 Liters.  Colon Prep Used Golytely for colon prep. Prep results: excellent.  Sedation Meds: Patient assessed and found to be appropriate for moderate (conscious) sedation. Fentanyl 75 mcg. given IV. Versed 8 mg. given IV.  Instrument(s): CF 140L. Serial [id number].  Findings - NORMAL EXAM: Ileum. Not Seen: AVM's. Tumors. Crohn's.  - NORMAL EXAM: Cecum to Rectum. Polyps. AVM's. Colitis. Tumors. Melanosis. Crohn's. Diverticulosis. Hemorrhoids. Biopsy/Normal Exam taken. Comments: Bx. every 10cm. done....   Assessment Normal examination.  Comments: R/O microscopic///collagenous colitis... Events  Unplanned Interventions: No intervention was  required.  Plans Medication Plan: Await pathology.  Disposition: After procedure patient sent to recovery. After recovery patient sent home.  Scheduling/Referral: Follow-Up prn. Await pathology to schedule patient.    cc: Arlan Organ, MD     Reynaldo Minium, MD   This report was created from the original endoscopy report, which was reviewed and signed by the above listed endoscopist.     Appended Document: Colonoscopy SP Surgical Pathology - STATUS: Final             By: Windy Kalata,      Perform Date: 22Jun07 00:01  Ordered By: Talbert Forest Date: 23Jun07 12:29  Facility: [facility]                               Department: CPATH  Service Report Text  [facility] [name] [address]   [place], [place] [address]   Telephone [phone] or [phone] Fax [phone]    REPORT OF SURGICAL PATHOLOGY    Case #: [id number]   Patient Name: Amy Black, Amy P.   Office Chart Number: [id number]    MRN: [id number]   Pathologist: Alden Server A. Delila Spence, MD   DOB/Age 10-12-1940 (Age: 70) Gender: F   Date Taken: 07/28/2005   Date Received: 07/28/2005    FINAL DIAGNOSIS    ***MICROSCOPIC EXAMINATION AND DIAGNOSIS***    COLON, BIOPSY: BENIGN COLONIC MUCOSA. NO SIGNIFICANT   INFLAMMATION OR OTHER ABNORMALITIES IDENTIFIED.    COMMENT   There is colorectal mucosa with normal crypt architecture and no   objective increase in inflammation. No active inflammation,   microscopic colitis, collagenous colitis or significant chronic   changes identified. No hyperplastic or adenomatous changes are  seen, and there is no evidence of malignancy. (EA:jy) 07/31/05    jy   Date Reported: 07/31/2005 Alden Server A. Delila Spence, MD   *** Electronically Signed Out By EAA ***    Clinical information   R/O microscopic colitis and a collagenous colitis; diarrhea (gt)    specimen(s) obtained   Colon, biopsy, random    Gross Description   Received in  formalin are tan, soft tissue fragments that are   submitted in toto. Number: multiple   Size: 0.2 to 0.4 cm one block (MA:jes, 07/31/05)    jes/

## 2010-03-08 NOTE — Procedures (Signed)
Summary: Upper Endoscopy  Patient: [name] Note: All result statuses are Final unless otherwise noted.  Tests: (1) Upper Endoscopy (EGD)   EGD Upper Endoscopy       DONE     [facility]     [address]     [place], [place]  [address]           ENDOSCOPY PROCEDURE REPORT           PATIENT:  Amy Black, Amy Black  MR#:  [id number]     BIRTHDATE:  08/19/1940, 69 yrs. old  GENDER:  female           ENDOSCOPIST:  Vania Rea. Jarold Motto, MD, [facility]     Referred by:  Arlan Organ, M.D.     Rudi Heap, M.D.           PROCEDURE DATE:  12/08/2009     PROCEDURE:  EGD, diagnostic 43235, EGD with biopsy, 43239     ASA CLASS:  Class III     INDICATIONS:  heartburn, melena metastatic skin cancer of ??     primary site.           MEDICATIONS:   Fentanyl 75 mcg IV, Versed 9 mg IV     TOPICAL ANESTHETIC:  Exactacain Spray           DESCRIPTION OF PROCEDURE:   After the risks benefits and     alternatives of the procedure were thoroughly explained, informed     consent was obtained.  The LB GIF-H180 [id number] endoscope was     introduced through the mouth and advanced to the second portion of     the duodenum, without limitations.  The instrument was slowly     withdrawn as the mucosa was fully examined.     <<PROCEDUREIMAGES>>           Mild gastritis was found in the antrum. Clo bx. done.  The upper,     middle, and distal third of the esophagus were carefully inspected     and no abnormalities were noted. The z-line was well seen at the     GEJ. The endoscope was pushed into the fundus which was normal     including a retroflexed view. The antrum,gastric body, first and     second part of the duodenum were unremarkable.    Retroflexed     views revealed no abnormalities.    The scope was then withdrawn     from the patient and the procedure completed.           COMPLICATIONS:  None           ENDOSCOPIC IMPRESSION:     1) Mild gastritis in the antrum     2) Normal EGD     NO  CAUSE FOR PAIN.     RECOMMENDATIONS:     1) Await biopsy results     2) Rx CLO if positive     3) continue PPI     4) My office will schedule a colonoscopy           REPEAT EXAM:  No           ______________________________     Vania Rea. Jarold Motto, MD, Clementeen Graham           CC:           n.     eSIGNED:   Vania Rea. Rashaan Wyles at 12/08/2009 10:23 AM  [place], [place], [place] [id number]  Note: An exclamation mark (!) indicates a result that was not dispersed into the flowsheet. Document Creation Date: 12/08/2009 10:24 AM _______________________________________________________________________  (1) Order result status: Final Collection or observation date-time: 12/08/2009 10:17 Requested date-time:  Receipt date-time:  Reported date-time:  Referring Physician:   Ordering Physician: Sheryn Bison [phone]) Specimen Source:  Source: Launa Grill Order Number: [id number] Lab site:

## 2010-03-08 NOTE — Miscellaneous (Signed)
Summary: [facility]  Clinical Lists Changes  Medications: Added new medication of MOVIPREP 100 GM  SOLR (PEG-KCL-NACL-NASULF-NA ASC-C) As per prep instructions. - Signed Rx of MOVIPREP 100 GM  SOLR (PEG-KCL-NACL-NASULF-NA ASC-C) As per prep instructions.;  #1 x 0;  Signed;  Entered by: Ezra Sites RN;  Authorized by: Mardella Layman MD [facility];  Method used: Electronically to [facility] [address] [place], [place], [place]  [address], Ph: [phone], Fax: [phone] Observations: Added new observation of ALLERGY REV: Done (12/13/2009 10:04)    Prescriptions: MOVIPREP 100 GM  SOLR (PEG-KCL-NACL-NASULF-NA ASC-C) As per prep instructions.  #1 x 0   Entered by:   Ezra Sites RN   Authorized by:   Mardella Layman MD [facility]   Signed by:   Ezra Sites RN on 12/13/2009   Method used:   Electronically to        [facility] (retail)       [address]       [place], [place]  [address]       Ph: [phone]       Fax: [phone]   RxID:   [id number]

## 2010-03-08 NOTE — Miscellaneous (Signed)
Summary: clotest  Clinical Lists Changes  Orders: Added new Test order of TLB-H Pylori Screen Gastric Biopsy (83013-CLOTEST) - Signed 

## 2010-03-08 NOTE — Letter (Signed)
Summary: Pre Visit Letter Revised  [facility]  [address] [place], [place] [address]   Phone: [phone]  Fax: [phone]        12/08/2009 MRN: [id number] [facility] [address] [place], [place]  [address]             Procedure Date:  12/24/2009 4pm   Welcome to the Gastroenterology Division at [facility].    You are scheduled to see a nurse for your pre-procedure visit on 12/13/2009 at 10:30am on the 3rd floor at [facility], [address]  We ask that you try to arrive at our office 15 minutes prior to your appointment time to allow for check-in.  Please take a minute to review the attached form.  If you answer "Yes" to one or more of the questions on the first page, we ask that you call the person listed at your earliest opportunity.  If you answer "No" to all of the questions, please complete the rest of the form and bring it to your appointment.    Your nurse visit will consist of discussing your medical and surgical history, your immediate family medical history, and your medications.   If you are unable to list all of your medications on the form, please bring the medication bottles to your appointment and we will list them.  We will need to be aware of both prescribed and over the counter drugs.  We will need to know exact dosage information as well.    Please be prepared to read and sign documents such as consent forms, a financial agreement, and acknowledgement forms.  If necessary, and with your consent, a friend or relative is welcome to sit-in on the nurse visit with you.  Please bring your insurance card so that we may make a copy of it.  If your insurance requires a referral to see a specialist, please bring your referral form from your primary care physician.  No co-pay is required for this nurse visit.     If you cannot keep your appointment, please call [phone] to cancel or reschedule prior to your appointment date.  This  allows [place] the opportunity to schedule an appointment for another patient in need of care.    Thank you for choosing [facility] for your medical needs.  We appreciate the opportunity to care for you.  Please visit [place] at our website  to learn more about our practice.  Sincerely, The Gastroenterology Division

## 2010-03-08 NOTE — Letter (Signed)
Summary: [facility]  [facility]   Imported By: Sherian Rein 12/11/2008 08:50:15  _____________________________________________________________________  External Attachment:    Type:   Image     Comment:   External Document

## 2010-03-08 NOTE — Letter (Signed)
Summary: Moviprep Instructions  [facility]  [address]   [place], [place] [address]   Phone: [phone]  Fax: [phone]       Amy Black    [birth year]    MRN: [id number]        Procedure Day /Date: Friday, 12-24-09     Arrival Time: 3:00 p.m.     Procedure Time: 4:00 p.m.     Location of Procedure:                     x [facility] (4th Floor)   PREPARATION FOR COLONOSCOPY WITH MOVIPREP   Starting 5 days prior to your procedure 12-19-09 do not eat nuts, seeds, popcorn, corn, beans, peas,  salads, or any raw vegetables.  Do not take any fiber supplements (e.g. Metamucil, Citrucel, and Benefiber).  THE DAY BEFORE YOUR PROCEDURE         DATE:  12-23-09   DAY: Thursday  1.  Drink clear liquids the entire day-NO SOLID FOOD  2.  Do not drink anything colored red or purple.  Avoid juices with pulp.  No orange juice.  3.  Drink at least 64 oz. (8 glasses) of fluid/clear liquids during the day to prevent dehydration and help the prep work efficiently.  CLEAR LIQUIDS INCLUDE: Water Jello Ice Popsicles Tea (sugar ok, no milk/cream) Powdered fruit flavored drinks Coffee (sugar ok, no milk/cream) Gatorade Juice: apple, white grape, white cranberry  Lemonade Clear bullion, consomm, broth Carbonated beverages (any kind) Strained chicken noodle soup Hard Candy                             4.  In the morning, mix first dose of MoviPrep solution:    Empty 1 Pouch A and 1 Pouch B into the disposable container    Add lukewarm drinking water to the top line of the container. Mix to dissolve    Refrigerate (mixed solution should be used within 24 hrs)  5.  Begin drinking the prep at 5:00 p.m. The MoviPrep container is divided by 4 marks.   Every 15 minutes drink the solution down to the next mark (approximately 8 oz) until the full liter is complete.   6.  Follow completed prep with 16 oz of clear liquid of your choice (Nothing red or  purple).  Continue to drink clear liquids until bedtime.  7.  Before going to bed, mix second dose of MoviPrep solution:    Empty 1 Pouch A and 1 Pouch B into the disposable container    Add lukewarm drinking water to the top line of the container. Mix to dissolve    Refrigerate  THE DAY OF YOUR PROCEDURE      DATE: 12-24-09  DAY: Friday  Beginning at 11:00 a.m. (5 hours before procedure):         1. Every 15 minutes, drink the solution down to the next mark (approx 8 oz) until the full liter is complete.  2. Follow completed prep with 16 oz. of clear liquid of your choice.    3. You may drink clear liquids until  2:00 p.m. (2 HOURS BEFORE PROCEDURE).   MEDICATION INSTRUCTIONS  Unless otherwise instructed, you should take regular prescription medications with a small sip of water   as early as possible the morning of your procedure.   Additional medication instructions: Do not take Dyazide day of procedure.  OTHER INSTRUCTIONS  You will need a responsible adult at least 68 years of age to accompany you and drive you home.   This person must remain in the waiting room during your procedure.  Wear loose fitting clothing that is easily removed.  Leave jewelry and other valuables at home.  However, you may wish to bring a book to read or  an iPod/MP3 player to listen to music as you wait for your procedure to start.  Remove all body piercing jewelry and leave at home.  Total time from sign-in until discharge is approximately 2-3 hours.  You should go home directly after your procedure and rest.  You can resume normal activities the  day after your procedure.  The day of your procedure you should not:   Drive   Make legal decisions   Operate machinery   Drink alcohol   Return to work  You will receive specific instructions about eating, activities and medications before you leave.    The above instructions have been reviewed and explained to me by    Ezra Sites RN  December 13, 2009 10:41 AM     I fully understand and can verbalize these instructions _____________________________ Date _________

## 2010-03-08 NOTE — Assessment & Plan Note (Signed)
Summary: follow up per DRP/lk    History of Present Illness Visit Type: new patient  Primary GI MD: Sheryn Bison MD FACP FAGA Primary Provider: Ernestina Penna, MD  Requesting Provider: na Chief Complaint: Pt c/o IBS, diarrhea, constipation, bloating, black tarry stools, and lower abd pain History of Present Illness:   70 year old chronically disabled after American female followed Dr. 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 This patient has alternating diarrhea and constipation had a negative colonoscopy including random biopsies in 2005. She currently denies upper or lower GI complaints except for severe cramping with alternating diarrhea and constipation. Workup for bowel disorders and other causes of diarrhea in the past have been unremarkable. Today she is chewing diet chewing which has loads of sorbitol and fructose is probably contributing to her diarrhea. She denies upper GI or hepatobiliary complaints.   GI Review of Systems    Reports abdominal pain and  bloating.     Location of  Abdominal pain: lower abdomen.    Denies acid reflux, belching, chest pain, dysphagia with liquids, dysphagia with solids, heartburn, loss of appetite, nausea, vomiting, vomiting blood, weight loss, and  weight gain.      Reports black tarry stools, change in bowel habits, constipation, diarrhea, diverticulosis, and  irritable bowel syndrome.     Denies anal fissure, fecal incontinence, heme positive stool, hemorrhoids, jaundice, light color stool, liver problems, rectal bleeding, and  rectal pain.    Current Medications (verified): 1)  Verapamil Hcl 80 Mg Tabs (Verapamil Hcl) .... One Tablet By Mouth Four Times Daily 2)  Tramadol Hcl 50 Mg Tabs (Tramadol Hcl) .... As Needed For Pain 3)  Xanax 0.25 Mg Tabs (Alprazolam) .... One Tablet By Mouth Three Times A Day As Needed 4)  Lomotil 2.5-0.025 Mg Tabs (Diphenoxylate-Atropine) .... As  Needed For Diarrhea 5)  Zocor 10 Mg Tabs (Simvastatin) .... One Tablet By Mouth Twice A Week 6)  Aspirin 81 Mg Tbec (Aspirin) .... One Tablet By Mouth Once Daily 7)  Dyazide 37.5-25 Mg Caps (Triamterene-Hctz) .... One Tablet By Mouth Three Times A Week 8)  Protonix 40 Mg Solr (Pantoprazole Sodium) .... One Tablet By Mouth Once Daily  Allergies (verified): 1)  ! Iodine  Past History:  Past medical, surgical, family and social histories (including risk factors) reviewed for relevance to current acute and chronic problems.  Past Medical History: CAD (ICD-414.00) ANGINA, UNSTABLE (ICD-411.1) DIARRHEA (ICD-787.91) IRRITABLE BOWEL SYNDROME (ICD-564.1) DIVERTICULOSIS, COLON (ICD-562.10) HEPATIC CYST (ICD-573.8) ADENOCARCINOMA (ICD-199.1) Hyperlipidemia Hypertension  Past Surgical History: Reviewed history from 12/02/2009 and no changes required. resection of left leg lesion & radiaiton 2004  1. Diagnostic laparoscopy.  2. Extensive lysis of adhesions for 1 hour.  3. Left salpingo-oophorectomy. Hysterectomy  Family History: Reviewed history from 12/02/2009 and no changes required. No FH of Colon Cancer: Family History of Diabetes:  Family History of Heart Disease:  Family History of Bladder Cancer: mother  Social History: Reviewed history from 12/02/2009 and no changes required. Occupation: disabled married  Patient former smoker  Alcohol Use - no Daily Caffeine Use: 2 daily   Review of Systems       The patient complains of allergy/sinus, headaches-new, itching, and night sweats.  The patient denies anemia, anxiety-new, arthritis/joint pain, back pain, blood in urine, breast changes/lumps, change in vision, confusion, cough, coughing up blood, depression-new, fainting, fatigue, fever, hearing problems, heart murmur, heart rhythm changes, menstrual pain, muscle pains/cramps, nosebleeds, pregnancy symptoms, shortness  of breath, skin rash, sleeping problems, sore throat,  swelling of feet/legs, swollen lymph glands, thirst - excessive , urination - excessive , urination changes/pain, urine leakage, vision changes, and voice change.    Vital Signs:  Patient profile:   70 year old female Height:      63 inches Weight:      158 pounds BMI:     28.09 BSA:     1.75 Pulse rate:   64 / minute Pulse rhythm:   regular BP sitting:   132 / 80  (left arm) Cuff size:   regular  Vitals Entered By: Ok Anis CMA (December 03, 2009 10:55 AM)  Physical Exam  General:  Well developed, well nourished, no acute distress. Head:  Normocephalic and atraumatic. Abdomen:  Soft, nontender and nondistended. No masses, hepatosplenomegaly or hernias noted. Normal bowel sounds.nabdominal distention, masses or tenderness. Rectal:  section of the rectum is unremarkable as is rectal exam. There is firm hard almost impacted stool in the rectal vault which is guaiac-negative. Extremities:  No clubbing, cyanosis, edema or deformities noted. Neurologic:  Alert and  oriented x4;  grossly normal neurologically. Psych:  Alert and cooperative. Normal mood and affect.    Impression & Recommendations:  Problem # 1:  IRRITABLE BOWEL SYNDROME (ICD-564.1) Assessment Improved She Has Constipation predominant IBS exacerbated with episodes of diarrhea related probably to ingestion  po nonabsorbable carbohydrates.We had given her printed material to avoid sorbitol and fructose. I placed her on [facility] fiber and probiotic capsules twice a day pending followup colonoscopy exam I also have ordered a fecal Elastase-1 through 2 next chronic pancreatic insufficiency. She is up-to-date on her labs and is followed closely by hematology. I do not think in the future she should use Lomotil or Imodium.P.R.N. Levsin also prescribed .  Problem # 2:  GERD (ICD-530.81) Assessment: Improved continued Protonix 40 mg a day along with standard anti-reflex maneuvers. Because of a metastatic carcinoma  also would check endoscopy is recommended.  Problem # 3:  CAD (ICD-414.00) Assessment: Unchanged  Other Orders: T- * Misc. Laboratory test [phone])  Patient Instructions: 1)  Copy sent to : Ernestina Penna, MD  & Burna Cash, MD 2)  Handout given on artificial sweeteners. 3)  Buy [facility] over the counter and take once a day. 4)  Please go to the basement today for your labs.  5)  Your prescription(s) have been sent to you pharmacy.  6)  The medication list was reviewed and reconciled.  All changed / newly prescribed medications were explained.  A complete medication list was provided to the patient / caregiver. 7)  Copy sent to :  8)  Please continue current medications.  9)  Constipation and Hemorrhoids brochure given.  10)  Colonoscopy and Flexible Sigmoidoscopy brochure given.  11)  Upper Endoscopy brochure given.  Prescriptions: LEVSIN 0.125 MG TABS (HYOSCYAMINE SULFATE) take one by mouth as needed  #60 x 1   Entered by:   Harlow Mares CMA (AAMA)   Authorized by:   Mardella Layman MD [facility]   Signed by:   Mardella Layman MD [facility] on 12/03/2009   Method used:   Electronically to        [facility] (retail)       [address]       [place], [place]  [address]       Ph: [phone]  Fax: [phone]   RxID:   [id number]   Appended Document: Orders Update    Clinical Lists Changes  Orders: Added new Test order of EGD (EGD) - Signed

## 2010-03-08 NOTE — Progress Notes (Signed)
Summary: Recall colon when is she due?   Phone Note Call from Patient Call back at [facility] Phone [phone]   Call For: Dr Jarold Motto Summary of Call: When is she due for her next Colon? [facility] told her she has to stay on top of this.  Initial call taken by: Leanor Kail [facility],  November 12, 2009 1:28 PM  Follow-up for Phone Call        last colon was in 07.  Paper chart orderd. Darcey Nora RN, [facility]  November 12, 2009 1:52 PM  Path from 07, doesn't indicate recall.  Paper chart to your desk.  Please review and indicate colon recall.  Patient  is aware that Dr Jarold Motto is out of the office until 11/22/09 Follow-up by: Darcey Nora RN, CGRN,  November 12, 2009 5:14 PM  Additional Follow-up for Phone Call Additional follow up Details #1::        june 2012 Additional Follow-up by: Mardella Layman MD FACG,  November 18, 2009 11:44 AM    Additional Follow-up for Phone Call Additional follow up Details #2::    Patient  aware and recall entered in IDX for 07/08/2010 Follow-up by: Darcey Nora RN, CGRN,  November 18, 2009 11:53 AM   Appended Document: Recall colon when is she due? needs ov ..asap   Appended Document: Recall colon when is she due? Left a message on patients machine to call back.   Appended Document: Recall colon when is she due? appt made for 12/03/2009

## 2010-03-08 NOTE — Letter (Signed)
Summary: EGD Instructions  [facility]  [address] [place], [place] [address]   Phone: [phone]  Fax: [phone]       Amy Black    1940/08/06    MRN: [id number]       Procedure Day /Date: 12/08/2009     Arrival Time: 9:00am     Procedure Time: 10:00am     Location of Procedure:                    [facility] (4th Floor)   PREPARATION FOR ENDOSCOPY   On 12/08/2009 THE DAY OF THE PROCEDURE:  1.   No solid foods, milk or milk products are allowed after midnight the night before your procedure.  2.   Do not drink anything colored red or purple.  Avoid juices with pulp.  No orange juice.  3.  You may drink clear liquids until 8:00am, which is 2 hours before your procedure.                                                                                                CLEAR LIQUIDS INCLUDE: Water Jello Ice Popsicles Tea (sugar ok, no milk/cream) Powdered fruit flavored drinks Coffee (sugar ok, no milk/cream) Gatorade Juice: apple, white grape, white cranberry  Lemonade Clear bullion, consomm, broth Carbonated beverages (any kind) Strained chicken noodle soup Hard Candy   MEDICATION INSTRUCTIONS  Unless otherwise instructed, you should take regular prescription medications with a small sip of water as early as possible the morning of your procedure.                OTHER INSTRUCTIONS  You will need a responsible adult at least 68 years of age to accompany you and drive you home.   This person must remain in the waiting room during your procedure.  Wear loose fitting clothing that is easily removed.  Leave jewelry and other valuables at home.  However, you may wish to bring a book to read or an iPod/MP3 player to listen to music as you wait for your procedure to start.  Remove all body piercing jewelry and leave at home.  Total time from sign-in until discharge is approximately 2-3 hours.  You should go home  directly after your procedure and rest.  You can resume normal activities the day after your procedure.  The day of your procedure you should not:   Drive   Make legal decisions   Operate machinery   Drink alcohol   Return to work  You will receive specific instructions about eating, activities and medications before you leave.    The above instructions have been reviewed and explained to me by   _______________________    I fully understand and can verbalize these instructions _____________________________ Date _________

## 2010-03-10 NOTE — Letter (Signed)
Summary: [facility]  [facility]   Imported By: Lester Coram 01/26/2010 11:40:43  _____________________________________________________________________  External Attachment:    Type:   Image     Comment:   External Document

## 2010-03-10 NOTE — Letter (Signed)
Summary: [facility]  [facility]   Imported By: Lester [place] 02/09/2010 08:52:33  _____________________________________________________________________  External Attachment:    Type:   Image     Comment:   External Document

## 2010-04-05 NOTE — Letter (Signed)
Summary: [facility]  [facility]   Imported By: Lennie Odor 04/01/2010 15:43:31  _____________________________________________________________________  External Attachment:    Type:   Image     Comment:   External Document

## 2010-04-14 ENCOUNTER — Encounter (HOSPITAL_BASED_OUTPATIENT_CLINIC_OR_DEPARTMENT_OTHER): Payer: MEDICARE | Admitting: Hematology & Oncology

## 2010-04-14 ENCOUNTER — Other Ambulatory Visit: Payer: Self-pay | Admitting: Hematology & Oncology

## 2010-04-14 DIAGNOSIS — R071 Chest pain on breathing: Secondary | ICD-10-CM

## 2010-04-14 DIAGNOSIS — C801 Malignant (primary) neoplasm, unspecified: Secondary | ICD-10-CM

## 2010-04-14 DIAGNOSIS — Z859 Personal history of malignant neoplasm, unspecified: Secondary | ICD-10-CM

## 2010-04-14 DIAGNOSIS — C792 Secondary malignant neoplasm of skin: Secondary | ICD-10-CM

## 2010-04-14 LAB — CMP (CANCER CENTER ONLY)
ALT(SGPT): 15 U/L (ref 10–47)
AST: 20 U/L (ref 11–38)
Albumin: 3.8 g/dL (ref 3.3–5.5)
Alkaline Phosphatase: 84 U/L (ref 26–84)
BUN, Bld: 10 mg/dL (ref 7–22)
CO2: 32 mEq/L (ref 18–33)
Calcium: 9.5 mg/dL (ref 8.0–10.3)
Chloride: 97 mEq/L — ABNORMAL LOW (ref 98–108)
Creat: 0.9 mg/dl (ref 0.6–1.2)
Glucose, Bld: 95 mg/dL (ref 73–118)
Potassium: 5 mEq/L — ABNORMAL HIGH (ref 3.3–4.7)
Sodium: 139 mEq/L (ref 128–145)
Total Bilirubin: 0.6 mg/dl (ref 0.20–1.60)
Total Protein: 7.8 g/dL (ref 6.4–8.1)

## 2010-04-21 ENCOUNTER — Ambulatory Visit (HOSPITAL_BASED_OUTPATIENT_CLINIC_OR_DEPARTMENT_OTHER)
Admission: RE | Admit: 2010-04-21 | Discharge: 2010-04-21 | Disposition: A | Discharge status: Home / Self Care | Payer: MEDICARE | Source: Ambulatory Visit | Attending: Hematology & Oncology | Admitting: Hematology & Oncology

## 2010-04-21 DIAGNOSIS — K838 Other specified diseases of biliary tract: Secondary | ICD-10-CM | POA: Insufficient documentation

## 2010-04-21 DIAGNOSIS — Z85830 Personal history of malignant neoplasm of bone: Secondary | ICD-10-CM | POA: Insufficient documentation

## 2010-04-21 DIAGNOSIS — R079 Chest pain, unspecified: Secondary | ICD-10-CM

## 2010-04-21 MED ORDER — IOHEXOL 300 MG/ML  SOLN
100.0000 mL | Freq: Once | INTRAMUSCULAR | Status: AC | PRN
  Administered 2010-04-21: 100 mL via INTRAVENOUS

## 2010-04-24 LAB — CBC
HCT: 34.8 % — ABNORMAL LOW (ref 36.0–46.0)
Hemoglobin: 12 g/dL (ref 12.0–15.0)
MCHC: 34.3 g/dL (ref 30.0–36.0)
MCV: 94.9 fL (ref 78.0–100.0)
Platelets: 397 10*3/uL (ref 150–400)
RBC: 3.67 MIL/uL — ABNORMAL LOW (ref 3.87–5.11)
RDW: 14.2 % (ref 11.5–15.5)
WBC: 7.1 10*3/uL (ref 4.0–10.5)

## 2010-04-24 LAB — POCT I-STAT 3, ART BLOOD GAS (G3+)
Acid-Base Excess: 4 mmol/L — ABNORMAL HIGH (ref 0.0–2.0)
Bicarbonate: 28.2 mEq/L — ABNORMAL HIGH (ref 20.0–24.0)
O2 Saturation: 99 %
Patient temperature: 97.8
TCO2: 29 mmol/L (ref 0–100)
pCO2 arterial: 37.3 mmHg (ref 35.0–45.0)
pH, Arterial: 7.486 — ABNORMAL HIGH (ref 7.350–7.400)
pO2, Arterial: 107 mmHg — ABNORMAL HIGH (ref 80.0–100.0)

## 2010-04-24 LAB — URINE CULTURE: Colony Count: 60000

## 2010-04-24 LAB — CK TOTAL AND CKMB (NOT AT ARMC)
CK, MB: 1.3 ng/mL (ref 0.3–4.0)
Relative Index: INVALID (ref 0.0–2.5)
Total CK: 90 U/L (ref 7–177)

## 2010-04-24 LAB — D-DIMER, QUANTITATIVE: D-Dimer, Quant: <0.22 ug/mL-FEU (ref 0.00–0.48)

## 2010-04-24 LAB — BASIC METABOLIC PANEL
BUN: 9 mg/dL (ref 6–23)
CO2: 30 mEq/L (ref 19–32)
Calcium: 9.2 mg/dL (ref 8.4–10.5)
Chloride: 99 mEq/L (ref 96–112)
Creatinine, Ser: 0.9 mg/dL (ref 0.4–1.2)
GFR calc Af Amer: >60 mL/min (ref >60)
GFR calc non Af Amer: >60 mL/min (ref >60)
Glucose, Bld: 100 mg/dL — ABNORMAL HIGH (ref 70–99)
Potassium: 4.7 mEq/L (ref 3.5–5.1)
Sodium: 135 mEq/L (ref 135–145)

## 2010-04-24 LAB — URINALYSIS, ROUTINE W REFLEX MICROSCOPIC
Bilirubin Urine: NEGATIVE
Glucose, UA: NEGATIVE mg/dL
Hgb urine dipstick: NEGATIVE
Ketones, ur: NEGATIVE mg/dL
Nitrite: NEGATIVE
Protein, ur: NEGATIVE mg/dL
Specific Gravity, Urine: 1.006 (ref 1.005–1.030)
Urobilinogen, UA: 0.2 mg/dL (ref 0.0–1.0)
pH: 6.5 (ref 5.0–8.0)

## 2010-04-24 LAB — DIFFERENTIAL
Basophils Absolute: 0.1 10*3/uL (ref 0.0–0.1)
Basophils Relative: 1 % (ref 0–1)
Eosinophils Absolute: 0.4 10*3/uL (ref 0.0–0.7)
Eosinophils Relative: 6 % — ABNORMAL HIGH (ref 0–5)
Lymphocytes Relative: 47 % — ABNORMAL HIGH (ref 12–46)
Lymphs Abs: 3.3 10*3/uL (ref 0.7–4.0)
Monocytes Absolute: 0.7 10*3/uL (ref 0.1–1.0)
Monocytes Relative: 9 % (ref 3–12)
Neutro Abs: 2.7 10*3/uL (ref 1.7–7.7)
Neutrophils Relative %: 37 % — ABNORMAL LOW (ref 43–77)

## 2010-04-24 LAB — APTT: aPTT: 30 seconds (ref 24–37)

## 2010-04-24 LAB — URINE MICROSCOPIC-ADD ON

## 2010-04-24 LAB — PROTIME-INR
INR: 1.14 (ref 0.00–1.49)
Prothrombin Time: 14.5 seconds (ref 11.6–15.2)

## 2010-04-24 LAB — TROPONIN I: Troponin I: 0.08 ng/mL — ABNORMAL HIGH (ref 0.00–0.06)

## 2010-05-19 ENCOUNTER — Encounter: Payer: Self-pay | Admitting: Nurse Practitioner

## 2010-05-19 DIAGNOSIS — F419 Anxiety disorder, unspecified: Secondary | ICD-10-CM | POA: Insufficient documentation

## 2010-06-09 ENCOUNTER — Telehealth: Payer: Self-pay | Admitting: Gastroenterology

## 2010-06-09 MED ORDER — RIFAXIMIN 550 MG PO TABS: ORAL_TABLET | ORAL | Status: DC

## 2010-06-09 NOTE — Telephone Encounter (Signed)
Ok to repeat xifaxan 550 mg bid for 10 days and daily align..See me if not better

## 2010-06-09 NOTE — Telephone Encounter (Signed)
Patient calling with c/o diarrhea stools once or twice/week for the last month and abdominal pain at her belly button right in the middle of stomach. Denies bleeding or cramping. Levsin is not helping. States she had Xifaxan in the past and is wondering if she could have it again. Last colon 12/24/09-moderate diverticulosis, hypertrophied anal papillae, hx IBS. Please, advise.

## 2010-06-09 NOTE — Telephone Encounter (Signed)
Patient notified of Dr. Norval Gable recommendations. Rx sent. Patient will call back if not better.

## 2010-06-24 NOTE — H&P (Signed)
NAMEGENEVRA, ORNE                ACCOUNT NO.:  [id number]   MEDICAL RECORD NO.:  [id number]           PATIENT TYPE:   LOCATION:                                 FACILITY:   PHYSICIAN:  Juan H. Lily Peer, M.D.     DATE OF BIRTH:   DATE OF ADMISSION:  DATE OF DISCHARGE:                                HISTORY & PHYSICAL   HISTORY:  Patient is a 69 year old gravida 3, para 2, ab 1, who was seen for  her annual gynecological examination on November 16, 2005.  Patient's past  medical history is significant for the fact that she was diagnosed with  adenosquamous carcinoma of the left leg of unknown etiology a few years ago,  and she has been followed since October of 2004.  Her primary source has  never been determined.  The patient has been followed by Rose Phi. Myna Hidalgo,  M.D., her oncologist, as well as her cardiologist, Richard A. Alanda Amass,  M.D., who has been following her as well for unstable angina.  Review of her  records indicated that her mammogram had been up-to-date.  She had a  colonoscopy in 2007 which was normal.  Her bone density had demonstrated  osteopenia.  Patient had an ultrasound for followup from last year and it  appeared that she had hydrosalpinx, but the ultrasound this year had  demonstrated a cystic mass with a __________ tissue, questionable  hydrosalpinx.  The mass now measured 4.6 x 3.9 cm and the ovary itself  measured 4.9 x 3.3 x 4.5 cm.  Her operative report from 43 whereby she had  an exploratory laparotomy for suspected bilateral ovarian cysts revealed  that she was found to have only a right ovary, and there was no evidence of  left ovary or adnexal tissue.  The pathology report had demonstrated benign  serous cystadenoma and dilated fallopian tube.  Based on the changing  consistency of the size of the cyst, from what appeared to by a hydrosalpinx  now a cystic mass, she was referred to the gynecology oncologist, Rejeana Brock A.  Duard Brady, MD, for which the  patient saw in consultation on November 14.  Patient states interestingly that she has pain in her right groin area if  she sits for a long time or with driving a car, and feels pain in the right  groin radiating to her back.  She denies any bloating.  She stated she has  had a 10-pound weight loss in the past year, although she has been  intentionally trying to lose weight.  Denies any changes in bowel movement  or bladder habits.  Occasionally she complains of shortness of breath for  which she has followed with Richard A. Alanda Amass, M.D., for her unstable  angina.  Patient has had a negative PET scan in 2004 as part of her workup  for adenocarcinoma of unknown etiology.  Her followup CT scans in January  2005, November 2006, February 2007, and August 2007 have only shown the 4-cm  cyst, and appear to be stable with the exception of the recent change that  was noted above on ultrasound.  The patient had a recent Cardiolite  myocardial perfusion by Richard A. Alanda Amass, M.D., which was reported to be  normal.   REVIEW OF SYSTEMS:  As noted above.  She denies any major bowel movement or  genitourinary changes, nor visual or appetite changes.   PAST MEDICAL HISTORY:  1. She had a total abdominal hysterectomy in 1982.  2. She had a laparotomy with a right salpingo-oophorectomy in 1992.  3. She had an excision of a left leg lesion which was described as a      metastatic adenocarcinoma, source undetermined.  4. History of unstable angina.  5. Irritable bowel syndrome.   PAST SURGICAL HISTORY:  1. Wide local excision of left leg lesion with radiation.  2. She has had abdominal hysterectomy followed by a laparotomy for removal      of right adnexa.   MEDICATIONS:  1. Verapamil 80 mg daily.  2. Imdur 30 mg daily.  3. Baby aspirin one daily.  4. Dyazide 37.5 mg daily.  5. Xanax as needed.   SOCIAL HISTORY:  She denies smoking or alcohol consumption.  She is married.  She is on  disability secondary to her coronary artery disease.   ALLERGIES:  IODINE.   FAMILY HISTORY:  Mother with bladder cancer.  Patient denied smoking or  alcohol consumption.  Uncle who died of malignancy of unknown type.   PHYSICAL EXAMINATION:  GENERAL:  The patient is a well-developed, well-  nourished female.  HEENT:  Unremarkable.  NECK:  Supple.  Trachea midline.  No carotid bruits.  No thyromegaly.  LUNGS:  Clear to auscultation.  HEART:  Regular rate and rhythm.  No murmurs or gallops.  BREASTS:  At time of her annual examination was unremarkable.  ABDOMEN:  Soft, nontender.  No rebound or guarding.  PELVIC:  Bartholin and urethra Skene glands within normal limits.  Vaginal  cuff intact.  Slight fullness in the patient's right, although the mass is  on her left.  Rectovaginal examination otherwise had been unremarkable.   ASSESSMENT:  Patient is a 70 year old female with an ovarian mass of 4-cm  cystic that had been stable since 2004, with recent changes on ultrasound  done in the office where it appeared to be bi-lobed and appears to have  changed from previous scans.  No Doppler flow was noted to the ovary.   Patient would like to proceed with removal of the mass.  Paola A. Duard Brady,  MD, recommended an attempt to remove it laparoscopically, if not to follow  through with a laparotomy.  The risks, benefits, pros, and cons of the  operation have been discussed with her, with the patient, as well as with  me.  Potential risks include infection (although she will receive  prophylaxis antibiotic), risk for deep venous thrombosis, with a risk for  pulmonary embolism and death, also anesthetic complication will be discussed  by the anesthesia service, also potential trauma to internal organs.  Patient will also have a bowel preparation as well.  All these issues were discussed with the patient.  She was instructed to hold off on her aspirin.  All questions were answered and we will  follow up accordingly.   PLAN:  Patient scheduled for a laparoscopic left-sided salpingo-  oophorectomy, possible laparotomy on Tuesday, January 02, 2006, at 10:30  a.m. at [facility].  Please have History and Physical available.      Juan H. Lily Peer, M.D.  Electronically Signed  JHF/MEDQ  D:  12/30/2005  T:  12/30/2005  Job:  [id number]

## 2010-06-24 NOTE — Consult Note (Signed)
Amy Black, Amy Black              ACCOUNT NO.:  [id number]   MEDICAL RECORD NO.:  [id number]          PATIENT TYPE:  OUT   LOCATION:  GYN                          FACILITY:  [facility]   PHYSICIAN:  Paola A. Duard Brady, MD    DATE OF BIRTH:  06/06/1940   DATE OF CONSULTATION:  12/20/2005  DATE OF DISCHARGE:                                 CONSULTATION   The patient is seen today in consultation at the request of Dr.  Lily Peer.  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  She has been followed for quite some  time, dating back as far as October 2004, at which time she has been  noted to have a 4 x 4 cm cystic mass in the area of the left adnexa.  She had a negative PET scan October 2004 as part of her workup for her  adenocarcinoma of unknown etiology.  Follow up CT scan in January 2005,  November 2006, February 2007, and August 2007 at all times showed  approximately a 4 cm cyst that appeared to be stable.  However, on  ultrasound in Dr. Fontaine No office, it appears that she has a cystic  mass with room of ovarian tissue measuring 4.6 x 3.9 cm, and in  addition, the ovary which measures 4.9 x 3.3 x 4.5 cm.  This was felt to  be consistent with change in her mass, and we were asked to see the  patient in consultation for surgical management.  The patient states  that her pain primarily is on her right groin if she sits for a long  time and, for example, driving in the car and then gets out, she feels  pain in her right groin that radiates to her back.  She denies any  bloating.  She has had 10 pound weight loss over the past year which has  not been intentional.  She denies any change in her bowel or bladder  habits.  She does complain of shortness of breath.  She does have  unstable angina, has not had any chest pain or shortness of breath for a  few months.  She is followed  by Dr. Alanda Amass on a routine basis.   REVIEW OF SYSTEMS:  As above.  She otherwise denies any significant  change in bowel or bladder habits, any headaches, night sweats, visual  changes.   PAST MEDICAL HISTORY:  1. 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  2. Unstable angina.  3. Irritable bowel syndrome.   PAST SURGICAL HISTORY:  1. She had a wide local excision of her left leg and radiation.  2. She had a hysterectomy for benign disease via laparotomy secondary      to endometriosis.   MEDICATIONS:  1. Verapamil 80 mg daily.  2. Imdur 30 mg daily.  3,  Baby aspirin daily.  1. Dyazide 37.5 mg daily.  2. Xanax p.r.n.   SOCIAL HISTORY:  Denies tobacco or alcohol.  She is married.  She is  disabled secondary to her coronary disease.   ALLERGIES:  IODINE which causes itching.   FAMILY HISTORY:  Her mother died of bladder cancer.  She was a  nonsmoker.  She had a paternal uncle who died of cancer of unknown type.   HEALTH MAINTENANCE:  She is up-to-date on her mammograms, colonoscopy.  She had a stress test in the early part of this year.   PHYSICAL EXAMINATION:  VITAL SIGNS:  Weight 153 pounds, height 5 feet  3.5 inches, blood pressure 110/72, pulse 60.  GENERAL:  Well-nourished, well-developed female in no acute distress.  NECK:  Supple.  There is no lymphadenopathy, no thyromegaly.  LUNGS:  Clear to auscultation bilaterally.  CARDIOVASCULAR EXAM:  Regular rate and rhythm.  ABDOMEN:  Soft.  She has a well-healed skin incision.  Abdomen is soft,  nontender, nondistended.  There are no palpable masses or  hepatosplenomegaly.  Groins are negative for adenopathy.  EXTREMITIES:  There is 1+ pitting edema equal bilaterally.  BIMANUAL EXAMINATION:  Reveals a fullness on the patient's right side  interestingly.  It is smooth.  Rectovaginal examination reveals no other  masses.   ASSESSMENT:  A 70 year old with ovarian mass that has been 4 cm cystic  which has been  stable since October 2004.  However, on ultrasound in Dr.  Fontaine No office, appears now to be bilobed two 4 cm cyst that appears  to be a change.  There was no flow noted on her ultrasound.  The patient  would like to have the mass removed secondary to her pain and  discomfort.  I think this is reasonable.  I do believe that we could  attempt this laparoscopically.  If laparoscopically, there is too  significant of an adhesive disease, then it could be converted to a  laparotomy.  The patient would be very interesting in proceeding with  laparoscopic procedure and will need to discuss this with Dr. Lily Peer.  We will be available for the surgery.  She does understand that if we  cannot remove the mass laparoscopically, it will be converted to a  midline vertical incision through her prior laparotomy incision.  If the  mass appears to be a malignant process, we will then need to proceed  with appropriate staging.  Her questions regarding this were answered to  her satisfaction.  Her cousin and her son were both with her today.  Their questions were elicited and answered to their satisfaction.  Risks  of surgery including bleeding, infection, injury to  surrounding organs, cardiovascular disease, death, and thromboembolic  disease were discussed with the patient, and she understands these  risks.  We will need to contact her cardiologist, Dr. Alanda Amass, to  ensure that there are no issues regarding preoperative clearance.  We  will schedule the surgery with Dr. Fontaine No his office.      Paola A. Duard Brady, MD  Electronically Signed     PAG/MEDQ  D:  12/20/2005  T:  12/20/2005  Job:  [id number]   cc:   Gerlene Burdock A. Alanda Amass, M.D.  Fax: [phone]   Rose Phi. Myna Hidalgo, M.D.  Fax: [phone]   Billie Lade, M.D.  Fax: [phone]   Vania Rea. Jarold Motto, MD, FACG, FACP, FAGA  [address]  [place]  [place] [address]  Gaetano Hawthorne. Lily Peer, M.D.  Fax: [phone]   Telford Nab, R.N.   [address]  [place], [place] [address]

## 2010-06-24 NOTE — Letter (Signed)
September 26, 2005     Rose Phi. Myna Hidalgo, MD  [address] [facility]  [place], [place] [address]   RE:  BIANNA, HARAN  MRN:  [id number]  /  DOB:  [birth year]   Dear Theron Arista:   I received your noted dated September 18, 2005 concerning [facility]  Pacey has recurrent bacterial overgrow syndrome with associated diarrhea.  Xifaxan is a non-absorbable antibiotic used in bacterial overgrowth syndrome  in doses of 400 mg t.i.d.  Usually while giving this medication, we also  supply probiotic therapy.  As you are aware, it is used for traveler's  diarrhea, but there are multiple other GI off label uses for this  medication.    Sincerely,      Vania Rea. Jarold Motto, MD, Clementeen Graham, [place]   DRP/MedQ  DD:  09/26/2005  DT:  09/27/2005  Job #:  [id number]

## 2010-06-24 NOTE — Op Note (Signed)
Amy Black, REITMAN                ACCOUNT NO.:  [id number]   MEDICAL RECORD NO.:  [id number]          PATIENT TYPE:  AMB   LOCATION:  DAY                          FACILITY:  [facility]   PHYSICIAN:  Paola A. Duard Brady, MD    DATE OF BIRTH:  03-12-1940   DATE OF PROCEDURE:  01/02/2006  DATE OF DISCHARGE:                               OPERATIVE REPORT   PREOPERATIVE DIAGNOSIS:  Persistent left adnexal mass, questionable  enlargement.   POSTOPERATIVE DIAGNOSIS:  Serous cyst.   PROCEDURE:  1. Diagnostic laparoscopy.  2. Extensive lysis of adhesions for 1 hour.  3. Left salpingo-oophorectomy.   SURGEONS:  Paola A. Duard Brady, MD and Gaetano Hawthorne. Lily Peer, M.D.   ANESTHESIA:  General.   ANESTHESIOLOGIST:  Quentin Cornwall. Council Mechanic, M.D.   SPECIMENS:  Left tubes and ovaries and washings.  Estimated blood loss  less than 50 mL.   URINE OUTPUT:  300 mL.   IV FLUIDS:  1300 mL.   COMPLICATIONS:  None.   OPERATIVE FINDINGS INCLUDE:  Extensive adhesive disease which was filmy;  however, small bowel to the anterior abdominal wall.  The rectosigmoid  colon to the anterior abdominal wall, and draping over the ovarian  fossa.  The ovary was adherent to the rectosigmoid colon.  There was no  evidence of any peritoneal implants or carcinomatosis.  The cyst was  smooth.  There was no intraoperative rupture.   DESCRIPTION OF PROCEDURE:  The patient was taken to the operating room  and placed in the supine position with arms tucked to her side to her  comfort level.  She was then induced under general anesthesia.  She was  then placed in dorsal lithotomy position.  Shoulder blocks were placed  with all appropriate precautions being taken.  The abdomen was prepped  in the usual sterile fashion.  The perineum was prepped in the usual  sterile fashion.  A Foley catheter was inserted into the bladder under  sterile conditions; and a sponge stick was inserted into the vagina.  A  time-out was performed to confirm  the patient procedure and surgeons.   Then 2 mL of 1/4% Marcaine without epinephrine was injected in the  infraumbilical area.  A transverse infraumbilical incision was made with  the knife and carried down to the underlying fascia using Mayo scissors.  The fascia was identified, grasped with Kocher clamps, elevated.  It was  entered sharply.  The fascial edges were secured with the #0 Vicryl in  our UR-6.  We had not managed to enter the posterior sheath.  The  posterior sheath was then grasped with Allis clamps, tented, and entered  sharply with Mayo's.  Once we had intraperitoneal placement, the Hasson  trocar was placed under direct visualization and secured.  The abdomen  insufflated with CO2 gas.  Never did the patient's pressure exceed 15  mmHg.  She was then placed in deep Trendelenburg position.   Under direct visualization, bilateral 5-mm ports were placed.  These  sites were identified first with Marcaine; and the ports were placed  under direct visualization.  A 5-mm  suprapubic port was then placed.  Using sharp dissection with Endo shear sutures, the filmy adhesions of  the small bowel to the abdominal wall; the small bowel to the  rectosigmoid colon; the rectosigmoid colon to the anterior abdominal  wall; and to the cul-de-sac were taken down sharply.  This was done with  meticulous care.  Washings were then obtained.   Our Attention was then drawn to the patient's left pelvic sidewall.  Posterior leaf of the broad ligament was opened and the retroperitoneal  space was entered.  The ureter was identified.  A window was made  between the ureter and the IP.  The IP was coagulated with bipolar  cautery and transected with monopolar cautery.  The remainder of the  filmy adhesions of the ovarian mass to the rectosigmoid colon were taken  down using sharp dissection.   At this point we switched to a 5 mm scope.  The 5-mm scope was then  placed through the suprapubic port.  A  10/12 EndoCatch bag was placed in  the umbilical port.  The bag was delivered out of the abdomen.  The mass  was ruptured within the bag with no intraperitoneal spillage; and the  mass was delivered.  It was examined intraoperatively and was noted to  be smooth-walled, with no excrescences, or nodularity.  It was not sent  for frozen section.   At this point, we switched back to our 10-mm scope.  The abdomen and  pelvis were copiously irrigated.  The small bowel and large bowel were  inspected; and were noted to be non-injured.  The ureter was nondilated  and peristalsing.  Under low flow.  The areas were inspected and noted  to be hemostatic.  The ports were removed under direct visualization.  The fascial incision in the infraumbilical port was closed using a new  running #0 Vicryl on a UR-6.  The skin was closed using 4-0 Vicryl.  Steri-Strips and Band-Aids were placed.   All instrument, needle, and Ray-Tec counts were correct x2.  Foley  catheter was removed as was the sponge stick.  The patient was taken to  recovery room in stable condition.      Paola A. Duard Brady, MD  Electronically Signed     PAG/MEDQ  D:  01/02/2006  T:  01/02/2006  Job:  [id number]   cc:   Gaetano Hawthorne. Lily Peer, M.D.  Fax: [phone]   Telford Nab, R.N.  [address]  [place], [place] [address]   Lucille Passy, M.D.   Rose Phi. Myna Hidalgo, M.D.  Fax: [phone]   Billie Lade, M.D.  Fax: [phone]   Vania Rea. Jarold Motto, MD, FACG, FACP, FAGA  [address]  [place]  [place] [address]

## 2010-06-24 NOTE — Assessment & Plan Note (Signed)
[facility]                         GASTROENTEROLOGY OFFICE NOTE   NAME:Vanderslice, Abe People                       MRN:          [id number]  DATE:06/08/2006                            DOB:          1941-01-29    Pansy has a bacterial overgrowth syndrome and has really responded well  to periodic antibiotic therapy.  She continues to have periodic diarrhea  but has kept her weight up around 160 pounds.  We discussed her disease  at length and have decided to treat her with Xifaxan 400 mg t.i.d. for  the first 10 days of each month along with Align probiotic therapy.  I  did send her by the lab today to check a B-12 level, also repeat serum  carotene.  She is to continue her followup with her multiple physicians  as previously outlined.     Vania Rea. Jarold Motto, MD, Caleen Essex, FAGA  Electronically Signed    DRP/MedQ  DD: 06/08/2006  DT: 06/08/2006  Job #: [id number]   cc:   Rose Phi. Myna Hidalgo, M.D.  Richard A. Alanda Amass, M.D.  Juan H. Lily Peer, M.D.

## 2010-06-24 NOTE — Discharge Summary (Signed)
. [facility]  Patient:    Amy Black, PERAGINE Visit Number: [id number] MRN: [id number]          Service Type: MED Location: [id number] [id number] Attending Physician:  Darlin Priestly Dictated by:   Raymon Mutton, P.A. Admit Date:  01/31/2001 Discharge Date: 02/01/2001   CC:         Richard A. Alanda Amass, M.D.   Discharge Summary  DATE OF BIRTH:  [birth year]  OFFICE CHART NUMBER:  [id number]  DISCHARGE DIAGNOSES: 1. Chest pain, resolved.  Ruled out myocardial infarction by negative    cardiac enzymes and normal electrocardiograms.  The patient reported    that she had a cardiac catheterization two years ago performed by    Dr. Alanda Amass which revealed normal coronaries.  Recent Persantine    Cardiolite Stress Test done October 2002 revealed normal imaging studies    with no evidence of ischemia and stress electrocardiogram was nondiagnostic    for ischemia.  Ejection fraction 60%. 2. History of coronary vasospasm. 3. Paroxysmal supraventricular tachycardia controlled on Verapamil    80 mg t.i.d. 4. Gastroesophageal reflux disease. 5. History of venous insufficiency. 6. Status post recent 2D echocardiogram in October 2002 showing mild    concentric left ventricular hypertrophy with normal left ventricular    systolic function.  No evidence of significant aortic stenosis or    regurgitation. Mitral annular calcifications noted without any definitive    prolapse.  Normal tricuspid valve with mild tricuspid regurgitation.    Mild pulmonary hypertension.  Normal right ventricular size and right    ventricular systolic function. 7. History of anemia treated with iron supplements.  HISTORY OF PRESENT ILLNESS:  This is a 70 year old African-American woman, a patient of Dr. Alanda Amass.  The patient entered the emergency room with complaint of chest pain.  She was admitted in October with the same symptoms and was ruled out for MI and after that  followed up with outpatient Cardiolite and 2D echocardiogram which both were within normal limits.  She experienced chest pain mostly in the right side of the chest which was a grabbing sensation and pressure.  She denied shortness of breath, nausea, vomiting or diaphoresis.  Her EKG in the emergency room did not show any acute changes. She was started oxygen and given an injection of morphine and transferred to the telemetry unit.  On her presentation to the emergency room the patient did not use her nitroglycerin patch because she said that she wanted to see what would happen.  She was restarted on Nitro-Dur patch and the pain went away but she still was admitted for observation and evaluation.  HOSPITAL PROCEDURES:  None.  HOSPITAL CONSULTS:  None.  HOSPITAL COMPLICATIONS:  None.  HOSPITAL COURSE:  She had an uncomplicated course.  She remained pain free after the Nitro-Dur patch was applied.  Her blood pressure remained stable 108/80.  Pulse 78, respirations 18 and O2 saturations 97 on room air.  Her TSH was 0.717.  CK 73, 56, and 55 her second and third sets respectively.  CK-MB 1.3-0.4 and less than 0.3.  Troponin I was less than 0.1 all three sets.  Her potassium was 4.2.  BUN 6, creatinine 0.9 and glucose 84.  Hemoglobin 10.6, hematocrit 31.4 and the second blood work revealed hemoglobin 11.3.  The patient complained of swelling of her left eye and said that she saw optometrist and he gave her new eye drops and made her eye swell and painful  and she was advised to follow up with ophthalmologist to address this problem with the left eye.  Her EKG remained stable.  Did not reveal any changes throughout her admission.  She was discharged home on February 01, 2001 in stable condition.  DISCHARGE RECOMMENDATIONS:  Activity as tolerated.  Diet:  Heart modified and low salt diet.  DISCHARGE MEDICATIONS:  Nitro-Dur patch 0.4 mg per hour and change daily. Verapamil 80 mg t.i.d.  Protonix 40 mg q.d.  Visken 5 mg q.d. Aspirin 81 mg q.d.  FOLLOW-UP:  She will follow up with Dr. Alanda Amass on March 07, 2000 at 2:50 p.m. and also advised to follow up with ophthalmologist to address the eye swelling. Dictated by:   Raymon Mutton, P.A. Attending Physician:  Darlin Priestly DD:  02/01/01 TD:  02/02/01 Job: [id number]

## 2010-06-24 NOTE — Discharge Summary (Signed)
[facility]  Patient:    Amy Black, Amy Black Visit Number: [id number] MRN: [id number]          Service Type: MED Location: 2000 2030 01 Attending Physician:  Amy Black Dictated by:   Amy Black, P.A. Admit Date:  11/06/2000 Discharge Date: 11/07/2000   CC:         Amy Black   Discharge Summary  DATE OF BIRTH:  November 25, 1940  [facility] NUMBER:  [id number]  ADMISSION DIAGNOSES: 1. Atypical chest pain, rule out myocardial infarction. 2. Known history of coronary vasospasm with remote normal coronaries. 3. Chronic bradycardia. 4. Gastroesophageal reflux disease. 5. Normal left ventricular function. 6. History of anemia on iron replacement therapy.  DISCHARGE DIAGNOSES: 1. Atypical chest pain, likely related to coronary vasospasm.  Myocardial    infarction ruled out with negative enzymes.  Medical therapy.  Outpatient    Cardiolite. 2. Known history of coronary vasospasm with remote normal coronaries. 3. Chronic bradycardia. 4. Gastroesophageal reflux disease. 5. Normal left ventricular function. 6. History of anemia on iron replacement therapy.  HISTORY OF PRESENT ILLNESS:  Amy Black is a 70 year old African-American female patient of Amy Black, M.D. with a history of normal coronary arteries and sinus bradycardia who has chronic problems with coronary vasospasm.  She also has GERD and IBS.  The patient presented to the emergency room with complaints of sharp shooting chest pains.  She states yesterday and today she is experiencing these symptoms also into the neck.  They are quick and in the center of the chest.  She has also had occasional palpitation. She had difficulty sleeping last night because of the palpitations.  She used nitroglycerin last evening with some relief.  She took a Darvocet this morning with some relief.  She took a second spray of nitroglycerin this morning  and felt tired and weak.  So she came to the emergency room.  The patient states she takes an extra 25 mg of Lopressor as needed for these chest stabbing sensations and took one this morning.  EKG in the emergency room showed sinus bradycardia with a rate of 44 and first degree AV block.  No acute abnormalities.  No ectope.  The patient will be admitted for atypical chest pain and rule out MI.  This may be her coronary vasospasm.  It could also be her reflux.  We are going to adjust her medications for bradycardia.  If she rules out for MI, we will discharge the patient to home and plan outpatient Cardiolite.  Reassurance has been given.  PROCEDURE:  None.  CONSULTING PHYSICIANS:  None.  COMPLICATIONS:  None.  HOSPITAL COURSE:  Amy Black was admitted to [facility] on November 06, 2000, for symptoms of atypical chest pain.  She was not placed on heparin or IV nitroglycerin as it was felt to be low risk and unlikely ischemia with a history of remote normal coronaries.  She does wear a nitroglycerin patch which we continued.  The patient remained stable since she was admitted.  Cardiac enzymes were negative x 3.  WBC 5.8, hemoglobin 10.6, platelets 517.  Potassium 4.2, BUN 6, and creatinine 0.9.  The patient remained stable overnight and had no further problems with chest pain.  She remained in sinus bradycardia and was asymptomatic with this.  No ectope and no arrhythmia.  The patient was felt stable for discharge to home on November 07, 2000.  DISCHARGE MEDICATIONS: 1.  Verapamil 80 mg t.i.d. (decreased). 2. Protonix 40 mg a day. 3. Aspirin 81 mg a day. 4. Estrace as before. 5. Nitroglycerin patch as before. 6. Dyazide Monday/Wednesday/Friday. 7. Lopressor 50 mg 1/2 pill as needed for palpitations.  Do not take this    medication if heart rate is below 60. 8. Darvocet and Xanax as needed as directed. 9. Nitroglycerin as needed.  ACTIVITY:  As  tolerated.  DIET:  As before.  The patient will have a Persantine Cardiolite done in the office and the office will call the patient with an appointment.  She will see Amy Black, R.N., N.P. on October 15, at 10:20.  If there are any problems or questions sooner, she is to call. Dictated by:   Amy Black, P.A. Attending Physician:  Amy Black DD:  11/07/00 TD:  11/07/00 Job: [id number]

## 2010-06-24 NOTE — Op Note (Signed)
NAME:  Amy Black, Amy Black                        ACCOUNT NO.:  [id number]   MEDICAL RECORD NO.:  [id number]                   PATIENT TYPE:  AMB   LOCATION:  DSC                                  FACILITY:  [facility]   PHYSICIAN:  Angelia Mould. Derrell Lolling, M.D.             DATE OF BIRTH:  August 26, 1940   DATE OF PROCEDURE:  03/18/2003  DATE OF DISCHARGE:                                 OPERATIVE REPORT   PREOPERATIVE DIAGNOSIS:  Malignant nodule of the skin of the left leg.   POSTOPERATIVE DIAGNOSIS:  Malignant nodule of the skin of the left leg.   OPERATION PERFORMED:  Excision of a 1.2 cm malignant nodule of the left  lower leg.   SURGEON:  Angelia Mould. Derrell Lolling, M.D.   ANESTHESIA:   INDICATIONS FOR PROCEDURE:  The patient is a 70 year old black female who  underwent biopsy of a skin nodule on the left lower leg in September of  2004.  This revealed adenocarcinoma and was interpreted as being a  metastasis.  This was ER and PR negative.  She has had extensive CT  scanning, PET scanning, chest x-ray, mammography, upper and lower endoscopy,  all of which failed to show any primary cancer.  She had primary radiation  therapy to this area a few months ago.  She still has the nodule on the  medial aspect of the lower leg.  Dr. Myna Hidalgo has asked me to excise this.   DESCRIPTION OF PROCEDURE:  The patient was brought to the minor procedure  room at [facility].  The left lower leg was prepped and  draped in sterile fashion.  A transverse elliptical incision was made around  the 1.2 cm nodule.  I tried to get about a 5 mm margin around this and took  this all the way down into the deep subcutaneous tissue.  The margins of the  superior and anterior margins were marked with sutures.  Subcutaneous tissue  was undermined a little bit to free up the skin for closure.  Hemostasis was  excellent.  The skin was closed with interrupted sutures of 2-0 and 3-0  nylon.  This closed the skin  primarily and it looked fine.  A clean bandage  was placed.  The patient tolerated the procedure well and was taken back to  the waiting room in excellent condition.  The estimated blood loss was about  5 mL. Complications were none.  Sponge, needle and instrument counts were  correct.                                               Angelia Mould. Derrell Lolling, M.D.    HMI/MEDQ  D:  03/18/2003  T:  03/18/2003  Job:  [id number]   cc:   Rose Phi. Myna Hidalgo, M.D.  [address]  [place], [place] [address]  Fax: [phone]   Norval Gable. [place], M.D.  [address] [place]  [place] [address]  Fax: [phone]   [address]  [place]  [place] [address]  Fax: [phone]

## 2010-07-20 ENCOUNTER — Encounter (HOSPITAL_BASED_OUTPATIENT_CLINIC_OR_DEPARTMENT_OTHER): Payer: Medicare Other | Admitting: Hematology & Oncology

## 2010-07-20 ENCOUNTER — Other Ambulatory Visit: Payer: Self-pay | Admitting: Hematology & Oncology

## 2010-07-20 DIAGNOSIS — C801 Malignant (primary) neoplasm, unspecified: Secondary | ICD-10-CM

## 2010-07-20 DIAGNOSIS — Z859 Personal history of malignant neoplasm, unspecified: Secondary | ICD-10-CM

## 2010-07-20 DIAGNOSIS — C792 Secondary malignant neoplasm of skin: Secondary | ICD-10-CM

## 2010-07-20 LAB — CBC WITH DIFFERENTIAL (CANCER CENTER ONLY)
BASO#: 0 10*3/uL (ref 0.0–0.2)
BASO%: 0.5 % (ref 0.0–2.0)
EOS%: 4.6 % (ref 0.0–7.0)
Eosinophils Absolute: 0.3 10*3/uL (ref 0.0–0.5)
HCT: 35.3 % (ref 34.8–46.6)
HGB: 11.8 g/dL (ref 11.6–15.9)
LYMPH#: 2 10*3/uL (ref 0.9–3.3)
LYMPH%: 35.7 % (ref 14.0–48.0)
MCH: 30.9 pg (ref 26.0–34.0)
MCHC: 33.4 g/dL (ref 32.0–36.0)
MCV: 92 fL (ref 81–101)
MONO#: 0.5 10*3/uL (ref 0.1–0.9)
MONO%: 9.5 % (ref 0.0–13.0)
NEUT#: 2.8 10*3/uL (ref 1.5–6.5)
NEUT%: 49.7 % (ref 39.6–80.0)
Platelets: 397 10*3/uL (ref 145–400)
RBC: 3.82 10*6/uL (ref 3.70–5.32)
RDW: 13.5 % (ref 11.1–15.7)
WBC: 5.7 10*3/uL (ref 3.9–10.0)

## 2010-07-20 LAB — COMPREHENSIVE METABOLIC PANEL
ALT: 8 U/L (ref 0–35)
AST: 15 U/L (ref 0–37)
Albumin: 4.5 g/dL (ref 3.5–5.2)
Alkaline Phosphatase: 87 U/L (ref 39–117)
BUN: 11 mg/dL (ref 6–23)
CO2: 29 mEq/L (ref 19–32)
Calcium: 10 mg/dL (ref 8.4–10.5)
Chloride: 100 mEq/L (ref 96–112)
Creatinine, Ser: 0.96 mg/dL (ref 0.50–1.10)
Glucose, Bld: 87 mg/dL (ref 70–99)
Potassium: 5.2 mEq/L (ref 3.5–5.3)
Sodium: 137 mEq/L (ref 135–145)
Total Bilirubin: 0.3 mg/dL (ref 0.3–1.2)
Total Protein: 7.3 g/dL (ref 6.0–8.3)

## 2010-07-20 LAB — LACTATE DEHYDROGENASE: LDH: 129 U/L (ref 94–250)

## 2010-08-15 ENCOUNTER — Telehealth: Payer: Self-pay | Admitting: Gastroenterology

## 2010-08-15 NOTE — Telephone Encounter (Signed)
Pt with hx of diverticulosis, HH. Last ECL in 12/2009. On Protonix and regular diet. Pt reports stabbing chest pain x 2 Saturday mid line at the end of her bra. Pt given an appt with Willette Cluster, NP at 0900 am tomorrow.

## 2010-08-16 ENCOUNTER — Encounter: Payer: Self-pay | Admitting: Nurse Practitioner

## 2010-08-16 ENCOUNTER — Ambulatory Visit (INDEPENDENT_AMBULATORY_CARE_PROVIDER_SITE_OTHER): Payer: Medicare Other | Admitting: Nurse Practitioner

## 2010-08-16 ENCOUNTER — Encounter: Payer: Self-pay | Admitting: Gastroenterology

## 2010-08-16 DIAGNOSIS — K589 Irritable bowel syndrome without diarrhea: Secondary | ICD-10-CM | POA: Insufficient documentation

## 2010-08-16 DIAGNOSIS — R1310 Dysphagia, unspecified: Secondary | ICD-10-CM

## 2010-08-16 DIAGNOSIS — R079 Chest pain, unspecified: Secondary | ICD-10-CM | POA: Insufficient documentation

## 2010-08-16 NOTE — Progress Notes (Signed)
Amy Black [id number] 1940-06-22   HISTORY OR PRESENT ILLNESS : Amy Black is a 70 year old female known to Dr. Jarold Motto for history of small bowel bacterial overgrowth, irritable bowel syndrome, diverticulosis and GERD. She was last seen in October of 2011 at which time she was reevaluated for diarrhea / IBS type symptoms. Subsequent to that visit she had a colonoscopy which was unremarkable. Because of her history of metastatic carcinoma of unknown primary in 2004 she also underwent EGD which was remarkable only for gastritis. The patient called our office couple days ago with stabbing chest pain. Her first episode of this distal sternal chest pain was in April at which time she quickly saw her cardiologist Dr. Alanda Amass who follows her for CAD and HTN. Apparently an EKG and echocardiogram were negative and cardiac source of pain was excluded. Most recent episode of non-exertional, nonradiating chest pain was on Saturday. Pain lasted about 10 minutes. No associated shortness of breath. She had a second episode later in the day. No chest pain yesterday or today. Both times on Saturday she did take one nitroglycerin pill without improvement in pain. She subsequently took an Ultram, pain eventually subsided. Patient on chronic PPI. She describes occasional solid food dysphagia. Food sometimes stops in her esophagus but eventually moves on through. She has no abdominal pain. She had some self limited nausea and diarrhea last week but that has since resolved.  She has diarrhea predominant IBS. Her weight is stable.   Current Medications, Allergies, Past Medical History, Past Surgical History, Family History and Social History were reviewed in [employer] record.   PHYSICAL EXAMINATION : General: Well developed  female in no acute distress Head: Normocephalic and atraumatic Eyes:  sclerae anicteric,conjunctive pale Ears: Normal auditory acuity Mouth: No deformity or  lesions Neck: Supple, no masses.  Lungs: Clear throughout to auscultation Heart: Regular rate and rhythm; murmur present  Abdomen: Soft, nondistended, nontender. No masses or hepatomegaly noted. Normal bowel sounds Rectal: Not done. Musculoskeletal: Symmetrical with no gross deformities  Skin: No lesions on visible extremities Extremities: No edema or deformities noted Neurological: Alert oriented x 4, grossly nonfocal Cervical Nodes:  No significant cervical adenopathy Psychological:  Alert and cooperative. Normal mood and affect  ASSESSMENT AND PLAN :

## 2010-08-16 NOTE — Patient Instructions (Signed)
We scheduled the Endoscopy with Dr. Sheryn Bison. Directions and brochure provided.

## 2010-08-19 ENCOUNTER — Encounter: Payer: Self-pay | Admitting: Nurse Practitioner

## 2010-08-19 NOTE — Progress Notes (Signed)
Reviewed and agree with management. Carriann Hesse D. Nada Godley, M.D., FACG  

## 2010-08-19 NOTE — Assessment & Plan Note (Addendum)
Three recent episodes of distal chest pain and intermittent dysphagia. Patient saw Dr. Alanda Amass in April for the chest pain. I do not have records but patient tells me EKG, echo done. No cardiac source of pain was found. Continue PPI. For further evaluation and treatment will schedule EGD with possible dilation.   The benefits, risks, and potential complications of EGD with possible biopsies and/or dilation were discussed with the patient and she agrees to proceed.

## 2010-08-21 ENCOUNTER — Encounter: Payer: Self-pay | Admitting: Nurse Practitioner

## 2010-08-21 DIAGNOSIS — R1310 Dysphagia, unspecified: Secondary | ICD-10-CM | POA: Insufficient documentation

## 2010-08-21 NOTE — Assessment & Plan Note (Signed)
As above.

## 2010-08-24 ENCOUNTER — Telehealth: Payer: Self-pay | Admitting: Gastroenterology

## 2010-08-24 NOTE — Telephone Encounter (Signed)
Pt wanted me to inform Dr Jarold Motto Monday, 08/29/10 when she has her COLON, she wants to discuss something for her diarrhea. Made a note on appt line for COLON.

## 2010-08-29 ENCOUNTER — Other Ambulatory Visit: Payer: Self-pay | Admitting: Gastroenterology

## 2010-08-29 ENCOUNTER — Ambulatory Visit (AMBULATORY_SURGERY_CENTER): Payer: Medicare Other | Admitting: Gastroenterology

## 2010-08-29 ENCOUNTER — Encounter: Payer: Self-pay | Admitting: Gastroenterology

## 2010-08-29 DIAGNOSIS — D1330 Benign neoplasm of unspecified part of small intestine: Secondary | ICD-10-CM

## 2010-08-29 DIAGNOSIS — K2960 Other gastritis without bleeding: Secondary | ICD-10-CM

## 2010-08-29 DIAGNOSIS — K589 Irritable bowel syndrome without diarrhea: Secondary | ICD-10-CM

## 2010-08-29 DIAGNOSIS — K219 Gastro-esophageal reflux disease without esophagitis: Secondary | ICD-10-CM

## 2010-08-29 DIAGNOSIS — K2940 Chronic atrophic gastritis without bleeding: Secondary | ICD-10-CM

## 2010-08-29 DIAGNOSIS — K3189 Other diseases of stomach and duodenum: Secondary | ICD-10-CM

## 2010-08-29 DIAGNOSIS — K2970 Gastritis, unspecified, without bleeding: Secondary | ICD-10-CM | POA: Insufficient documentation

## 2010-08-29 DIAGNOSIS — R109 Unspecified abdominal pain: Secondary | ICD-10-CM

## 2010-08-29 MED ORDER — SODIUM CHLORIDE 0.9 % IV SOLN: 500.0000 mL | INTRAVENOUS | Status: DC

## 2010-08-29 MED ORDER — DIPHENOXYLATE-ATROPINE 2.5-0.025 MG PO TABS: 1.0000 | ORAL_TABLET | Freq: Four times a day (QID) | ORAL | Status: AC | PRN

## 2010-08-29 NOTE — Progress Notes (Signed)
Phenergan script faxed to [facility] at 5:30pm.

## 2010-08-29 NOTE — Progress Notes (Signed)
1625-MD reinserted scope to assess post dilation.

## 2010-08-29 NOTE — Progress Notes (Signed)
Pt requested interior of wrist for preferred IV site.  No IV site with straight length of vein noted.  IV site right hand 24 gauge infiltrated when IVF's opened.  Patent site obtained right AC.  Pt expressed she felt RN was "hard-headed" and she would not forget her name.

## 2010-08-30 ENCOUNTER — Encounter: Payer: Self-pay | Admitting: Gastroenterology

## 2010-08-30 ENCOUNTER — Telehealth: Payer: Self-pay | Admitting: *Deleted

## 2010-08-30 DIAGNOSIS — K2990 Gastroduodenitis, unspecified, without bleeding: Secondary | ICD-10-CM

## 2010-08-30 DIAGNOSIS — K219 Gastro-esophageal reflux disease without esophagitis: Secondary | ICD-10-CM

## 2010-08-30 DIAGNOSIS — K2970 Gastritis, unspecified, without bleeding: Secondary | ICD-10-CM

## 2010-08-30 LAB — HELICOBACTER PYLORI SCREEN-BIOPSY: UREASE: NEGATIVE

## 2010-08-30 NOTE — Telephone Encounter (Signed)
No ID on voice mail.   No message left. 

## 2010-09-01 ENCOUNTER — Telehealth: Payer: Self-pay | Admitting: Gastroenterology

## 2010-09-01 NOTE — Telephone Encounter (Signed)
Informed pt she can try Imodium OTC or Multi Symptom Imodium that is a liquid; pt stated understanding.

## 2010-09-05 ENCOUNTER — Encounter: Payer: Self-pay | Admitting: Gastroenterology

## 2010-09-16 ENCOUNTER — Telehealth: Payer: Self-pay | Admitting: Gastroenterology

## 2010-09-16 NOTE — Telephone Encounter (Signed)
Explained results of biopsy: she has active gastritis, but no cancer is seen. The biopsy was also negative for Celiac Disease, but some people do better on a celiac diet. Mailed pt a copy of Gluten Free Diet.

## 2010-09-26 ENCOUNTER — Telehealth: Payer: Self-pay | Admitting: Gastroenterology

## 2010-09-26 MED ORDER — DIPHENOXYLATE-ATROPINE 2.5-0.025 MG PO TABS: 1.0000 | ORAL_TABLET | Freq: Four times a day (QID) | ORAL | Status: AC | PRN

## 2010-09-26 NOTE — Telephone Encounter (Signed)
Pt reports diarrhea last Friday that lasted all day. The stools' consistency ranged from soft to watery. On 09/01/10, pt called for the same problems and stated she couldn't afford the Lomotil and she was to try Multi Symptom Imodium- pt stated the regular Imodium doesn't help her. Pt stated she couldn't find the MS Imodium. Today, I offered to order the Lomotil and she agreed.

## 2010-11-28 ENCOUNTER — Other Ambulatory Visit: Payer: Self-pay | Admitting: Gynecology

## 2010-11-28 ENCOUNTER — Encounter: Payer: Self-pay | Admitting: Gynecology

## 2010-11-28 ENCOUNTER — Ambulatory Visit (INDEPENDENT_AMBULATORY_CARE_PROVIDER_SITE_OTHER): Payer: Medicare Other | Admitting: Gynecology

## 2010-11-28 ENCOUNTER — Other Ambulatory Visit (HOSPITAL_COMMUNITY)
Admission: RE | Admit: 2010-11-28 | Discharge: 2010-11-28 | Disposition: A | Discharge status: Home / Self Care | Payer: Medicare Other | Source: Ambulatory Visit | Attending: Gynecology | Admitting: Gynecology

## 2010-11-28 VITALS — BP 118/70 | Ht 63.0 in | Wt 162.0 lb

## 2010-11-28 DIAGNOSIS — Z1211 Encounter for screening for malignant neoplasm of colon: Secondary | ICD-10-CM

## 2010-11-28 DIAGNOSIS — Z124 Encounter for screening for malignant neoplasm of cervix: Secondary | ICD-10-CM

## 2010-11-28 DIAGNOSIS — Z01419 Encounter for gynecological examination (general) (routine) without abnormal findings: Secondary | ICD-10-CM | POA: Insufficient documentation

## 2010-11-28 DIAGNOSIS — N951 Menopausal and female climacteric states: Secondary | ICD-10-CM

## 2010-11-28 DIAGNOSIS — Z1231 Encounter for screening mammogram for malignant neoplasm of breast: Secondary | ICD-10-CM

## 2010-11-28 LAB — POC HEMOCCULT BLD/STL (OFFICE/1-CARD/DIAGNOSTIC): Fecal Occult Blood, POC: NEGATIVE

## 2010-11-28 NOTE — Progress Notes (Signed)
Amy Black 1940-04-04 [id number]   History:    70 y.o.  For gynecological exam. 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 She has had a resection and radiation treatment. She has been followed by Dr.Enever the oncologist and recently has may of this year had a CT of the chest which was negative. She's also been followed by  Dr. Alanda Amass her cardiologist was then monitor her angina (patient with prior history of cardiac catheterization). Dr. Lattie Corns her primary physician and has done her labs approximately 6 months ago so no additional lap begun today. Review records indicated Dr. Jarold Motto done a colonoscopy in 2007. Her mammogram was in November 2011 her last bone density study was in November 2010 with evidence of decreased bone mineralization (osteopenia))  Frax analysis was within normal range. Patient with past history of hysterectomy with bilateral salpingo-oophorectomy. Past medical history,surgical history, family history and social history were all reviewed and documented in the EPIC chart.  ROS:  Was performed and pertinent positives and negatives are included in the history.  Exam: chaperone present BP 118/70  Ht 5\' 3"  (1.6 m)  Wt 162 lb (73.483 kg)  BMI 28.70 kg/m2  Body mass index is 28.70 kg/(m^2).  General appearance : Well developed well nourished female. No acute distress HEENT: Neck supple, trachea midline, no carotid bruits, no thyroidmegaly Lungs: Clear to auscultation, no rhonchi or wheezes, or rib retractions  Heart: Regular rate and rhythm, no murmurs or gallops Breast:Examined in sitting and supine position were symmetrical in appearance, no palpable masses or tenderness,  no skin retraction, no nipple inversion, no nipple discharge, no skin discoloration, no axillary or supraclavicular lymphadenopathy Abdomen: no palpable masses or tenderness, no rebound or  guarding Extremities: no edema or skin discoloration or tenderness  Pelvic:  Bartholin, Urethra, Skene Glands: Within normal limits             Vagina: No gross lesions or discharge  Cervix: Absent  Uterus  absent  Adnexa  Without masses or tenderness  Anus and perineum  normal   Rectovaginal  normal sphincter tone without palpated masses or tenderness             Hemoccult done results pending at time of this dictation     Assessment/Plan:  70 y.o. female for gynecological examination. Evidence of some slight vaginal to atrophy but patient asymptomatic otherwise. Patient with history of and a squamous carcinoma of the left leg resected followed by radiation with no primary ever having been established. Has continued to followup with the oncologist Dr. Twanna Hy on a regular basis. Patient with normal recent CT of the chest in May of this year. She'll continue to followup with Dr. Christell Constant her primary physician as well. Requisition was provided her to schedule her mammogram. She was instructed to continue her calcium and vitamin D for osteoporosis prevention. She will also continue to followup with Dr. Alanda Amass her cardiologist. Amy Black Labrador need to see her in 2-3 years or when necessary. Fetal cord blood tests and resulting in time of this dictation.   Ok Edwards MD, 11:33 AM 11/28/2010

## 2010-12-22 ENCOUNTER — Ambulatory Visit: Payer: Medicare Other

## 2011-01-19 ENCOUNTER — Other Ambulatory Visit: Payer: Medicare Other | Admitting: Lab

## 2011-01-19 ENCOUNTER — Telehealth: Payer: Self-pay | Admitting: *Deleted

## 2011-01-19 ENCOUNTER — Ambulatory Visit: Payer: Medicare Other | Admitting: Hematology & Oncology

## 2011-01-19 NOTE — Telephone Encounter (Signed)
Pt moved 12-13 to 12-31

## 2011-02-02 ENCOUNTER — Encounter: Payer: Self-pay | Admitting: *Deleted

## 2011-02-02 ENCOUNTER — Encounter: Payer: Self-pay | Admitting: Hematology & Oncology

## 2011-02-02 NOTE — Progress Notes (Signed)
Pt left a message on the voicemail stating that "I will just wait until my appt on the 31st... It is on Monday".

## 2011-02-02 NOTE — Progress Notes (Signed)
Patient called on 02-01-11 and requested her 02-06-11 appointment be moved up to 12-27 or 12-28, if possible.  I called the patient back on 12-27 at 10:30 a.m. and told her I saw no available 30 minute slots on Dr. Gustavo Lah schedule; however, I put her through to the nurse's line to authorize changing the appointment on 12-27 or 12-28, if possible.

## 2011-02-06 ENCOUNTER — Other Ambulatory Visit: Payer: Self-pay | Admitting: Hematology & Oncology

## 2011-02-06 ENCOUNTER — Other Ambulatory Visit (HOSPITAL_BASED_OUTPATIENT_CLINIC_OR_DEPARTMENT_OTHER): Payer: Medicare Other | Admitting: Lab

## 2011-02-06 ENCOUNTER — Ambulatory Visit (HOSPITAL_BASED_OUTPATIENT_CLINIC_OR_DEPARTMENT_OTHER): Payer: Medicare Other | Admitting: Hematology & Oncology

## 2011-02-06 ENCOUNTER — Encounter: Payer: Self-pay | Admitting: Hematology & Oncology

## 2011-02-06 DIAGNOSIS — Z859 Personal history of malignant neoplasm, unspecified: Secondary | ICD-10-CM

## 2011-02-06 DIAGNOSIS — C801 Malignant (primary) neoplasm, unspecified: Secondary | ICD-10-CM | POA: Insufficient documentation

## 2011-02-06 HISTORY — DX: Malignant (primary) neoplasm, unspecified: C80.1

## 2011-02-06 LAB — COMPREHENSIVE METABOLIC PANEL
ALT: 9 U/L (ref 0–35)
AST: 15 U/L (ref 0–37)
Albumin: 4.2 g/dL (ref 3.5–5.2)
Alkaline Phosphatase: 88 U/L (ref 39–117)
BUN: 14 mg/dL (ref 6–23)
CO2: 30 mEq/L (ref 19–32)
Calcium: 9.7 mg/dL (ref 8.4–10.5)
Chloride: 101 mEq/L (ref 96–112)
Creatinine, Ser: 0.99 mg/dL (ref 0.50–1.10)
Glucose, Bld: 82 mg/dL (ref 70–99)
Potassium: 5 mEq/L (ref 3.5–5.3)
Sodium: 137 mEq/L (ref 135–145)
Total Bilirubin: 0.3 mg/dL (ref 0.3–1.2)
Total Protein: 7.2 g/dL (ref 6.0–8.3)

## 2011-02-06 LAB — CBC WITH DIFFERENTIAL (CANCER CENTER ONLY)
BASO#: 0.1 10*3/uL (ref 0.0–0.2)
BASO%: 1.3 % (ref 0.0–2.0)
EOS%: 3.9 % (ref 0.0–7.0)
Eosinophils Absolute: 0.3 10*3/uL (ref 0.0–0.5)
HCT: 36 % (ref 34.8–46.6)
HGB: 11.6 g/dL (ref 11.6–15.9)
LYMPH#: 2.5 10*3/uL (ref 0.9–3.3)
LYMPH%: 39.8 % (ref 14.0–48.0)
MCH: 30.9 pg (ref 26.0–34.0)
MCHC: 32.2 g/dL (ref 32.0–36.0)
MCV: 96 fL (ref 81–101)
MONO#: 0.6 10*3/uL (ref 0.1–0.9)
MONO%: 8.8 % (ref 0.0–13.0)
NEUT#: 2.9 10*3/uL (ref 1.5–6.5)
NEUT%: 46.2 % (ref 39.6–80.0)
Platelets: 455 10*3/uL — ABNORMAL HIGH (ref 145–400)
RBC: 3.75 10*6/uL (ref 3.70–5.32)
RDW: 12.8 % (ref 11.1–15.7)
WBC: 6.4 10*3/uL (ref 3.9–10.0)

## 2011-02-06 NOTE — Progress Notes (Signed)
CC:   Juan H. Lily Peer, M.D. Vania Rea. Jarold Motto, MD, Clementeen Graham, FACP, FAGA Richard A. Alanda Amass, M.D.  DIAGNOSIS:  Adenosquamous carcinoma of unknown primary, in remission.  CURRENT THERAPY:  Observation.  INTERIM HISTORY:  Ms. Hanneman comes in for her followup.  We see her every 6 months.  She is doing okay.  She had an appointment earlier in the month which she missed because she said that she had a lot of pain under her right arm.  She had her last mammogram back in January.  Everything looked okay.  She has not noted any problems with cough or shortness of breath.  She has had no nausea or vomiting.  There has been no fever.  She has had no change in bowel or bladder habits.  Her last CT scan was done back in March 2012.  The CT scan was pretty much unremarkable.  PHYSICAL EXAMINATION:  General Appearance:  This is a well-developed, well-nourished, black female in no obvious distress.  Vital Signs: 97.1, pulse 59, respiratory rate 18, blood pressure 122/79.  Weight is 164.  Head and Neck Exam:  A normocephalic, atraumatic skull.  There are no ocular or oral lesions.  There are no palpable cervical or supraclavicular lymph nodes.  Lungs:  Clear bilaterally.  Cardiac Exam: Regular rate and rhythm with a normal S1 and S2.  There are no murmurs, rubs, or bruits.  Abdominal Exam:  Soft with good bowel sounds.  There is no palpable abdominal mass.  There is no fluid wave.  There is no palpable hepatosplenomegaly.  Axillary Exam:  No bilateral axillary adenopathy.  Extremities:  No clubbing, cyanosis, or edema.  She has good range of motion of her joints.  Skin Exam:  No rash, ecchymosis, or petechia.  Neurological Exam:  No focal neurological deficits.  LABORATORY STUDIES:  White cell count 6.4, hemoglobin 11.6, hematocrit 36, platelet count 455.  MCV is 96.  IMPRESSION:  Ms. Ginzburg is a 70 year old African American female with a history of adenosquamous carcinoma.  This was resected  from her right leg.  She had this done probably about close to 10 years ago.  She had radiation therapy after resection.  I still do not see any evidence of recurrence.  We will plan to get her back in another 6 months.  One still has to be cautious with respect to a recurrence since we have never identified the primary source.    ______________________________ Josph Macho, M.D. PRE/MEDQ  D:  02/06/2011  T:  02/06/2011  Job:  [id number]

## 2011-02-06 NOTE — Progress Notes (Signed)
This office note has been dictated.

## 2011-02-10 ENCOUNTER — Ambulatory Visit: Payer: Medicare Other

## 2011-03-01 ENCOUNTER — Ambulatory Visit: Payer: Medicare Other

## 2011-03-08 ENCOUNTER — Ambulatory Visit
Admission: RE | Admit: 2011-03-08 | Discharge: 2011-03-08 | Disposition: A | Discharge status: Home / Self Care | Payer: Medicare Other | Source: Ambulatory Visit | Attending: Gynecology | Admitting: Gynecology

## 2011-03-08 DIAGNOSIS — Z1231 Encounter for screening mammogram for malignant neoplasm of breast: Secondary | ICD-10-CM

## 2011-04-07 DIAGNOSIS — Z9289 Personal history of other medical treatment: Secondary | ICD-10-CM

## 2011-04-07 HISTORY — DX: Personal history of other medical treatment: Z92.89

## 2011-07-26 ENCOUNTER — Other Ambulatory Visit (HOSPITAL_BASED_OUTPATIENT_CLINIC_OR_DEPARTMENT_OTHER): Payer: Medicare Other | Admitting: Lab

## 2011-07-26 ENCOUNTER — Ambulatory Visit (HOSPITAL_BASED_OUTPATIENT_CLINIC_OR_DEPARTMENT_OTHER): Payer: Medicare Other | Admitting: Hematology & Oncology

## 2011-07-26 VITALS — BP 132/76 | HR 61 | Temp 97.1°F | Ht 63.0 in | Wt 161.0 lb

## 2011-07-26 DIAGNOSIS — C792 Secondary malignant neoplasm of skin: Secondary | ICD-10-CM

## 2011-07-26 DIAGNOSIS — C801 Malignant (primary) neoplasm, unspecified: Secondary | ICD-10-CM

## 2011-07-26 DIAGNOSIS — M810 Age-related osteoporosis without current pathological fracture: Secondary | ICD-10-CM

## 2011-07-26 LAB — COMPREHENSIVE METABOLIC PANEL
ALT: 10 U/L (ref 0–35)
AST: 15 U/L (ref 0–37)
Albumin: 4.4 g/dL (ref 3.5–5.2)
Alkaline Phosphatase: 88 U/L (ref 39–117)
BUN: 9 mg/dL (ref 6–23)
CO2: 30 mEq/L (ref 19–32)
Calcium: 9.8 mg/dL (ref 8.4–10.5)
Chloride: 103 mEq/L (ref 96–112)
Creatinine, Ser: 0.93 mg/dL (ref 0.50–1.10)
Glucose, Bld: 91 mg/dL (ref 70–99)
Potassium: 4.7 mEq/L (ref 3.5–5.3)
Sodium: 141 mEq/L (ref 135–145)
Total Bilirubin: 0.5 mg/dL (ref 0.3–1.2)
Total Protein: 7.3 g/dL (ref 6.0–8.3)

## 2011-07-26 LAB — CBC WITH DIFFERENTIAL (CANCER CENTER ONLY)
BASO#: 0 10*3/uL (ref 0.0–0.2)
BASO%: 0.6 % (ref 0.0–2.0)
EOS%: 2.9 % (ref 0.0–7.0)
Eosinophils Absolute: 0.2 10*3/uL (ref 0.0–0.5)
HCT: 35.8 % (ref 34.8–46.6)
HGB: 11.7 g/dL (ref 11.6–15.9)
LYMPH#: 1.5 10*3/uL (ref 0.9–3.3)
LYMPH%: 27.9 % (ref 14.0–48.0)
MCH: 31 pg (ref 26.0–34.0)
MCHC: 32.7 g/dL (ref 32.0–36.0)
MCV: 95 fL (ref 81–101)
MONO#: 0.4 10*3/uL (ref 0.1–0.9)
MONO%: 7.6 % (ref 0.0–13.0)
NEUT#: 3.2 10*3/uL (ref 1.5–6.5)
NEUT%: 61 % (ref 39.6–80.0)
Platelets: 414 10*3/uL — ABNORMAL HIGH (ref 145–400)
RBC: 3.78 10*6/uL (ref 3.70–5.32)
RDW: 13.2 % (ref 11.1–15.7)
WBC: 5.2 10*3/uL (ref 3.9–10.0)

## 2011-07-26 NOTE — Progress Notes (Signed)
This office note has been dictated.

## 2011-07-26 NOTE — Progress Notes (Signed)
CC:   Richard A. Alanda Amass, M.D. Vania Rea. Jarold Motto, MD, [place], FACP, FAGA [place] H. Lily Peer, M.D.  DIAGNOSIS:  Adenosquamous carcinoma of unknown primary, remission.  CURRENT THERAPY:  Observation.  INTERIM HISTORY:  Amy Black comes in for followup.  We see her every 6 months.  She has had no specific complaints since we last saw her.  She still has some discomfort in her shoulders.  Last time we saw her, she was complaining mostly of pain over on the right side.  She said that she has some discomfort in her shoulder blades.  This mostly occurs when she is lying down.  We have not had to do any scans on her for this.  Her last CT scan was done back in March of last year.  Everything looked fine with no evidence of recurrent disease or other abnormality.  She has had a good appetite.  She has had no nausea or vomiting.  She has had no cough.  There is no dysphasia or odynophagia.  She has had no leg swelling.  She has had no rashes.  PHYSICAL EXAMINATION:  This is a well-developed, well-nourished black female in no obvious distress.  Vital signs:  Temperature of 97.1, pulse 61, respiratory rate 18, blood pressure 132/76.  Weight is 161.  Head and neck:  Normocephalic, atraumatic skull.  There are no ocular or oral lesions.  There are no palpable cervical or supraclavicular lymph nodes. Lungs:  Clear to percussion and auscultation bilaterally.  Cardiac: Regular rate and rhythm with a normal S1, S2.  There are no murmurs, rubs or bruits.  Abdomen:  Soft with good bowel sounds.  There is no palpable abdominal mass.  There is no fluid wave.  There is no palpable hepatosplenomegaly.  Back:  No tenderness over the spine, ribs, or hips. Extremities:  No clubbing, cyanosis or edema.  She has good range of motion of her joints.  Neurological:  Shows no focal neurological deficits.  LABORATORY STUDIES:  White cell count is 5.2, hemoglobin 11.7, hematocrit 35.8, platelet count  414.  Mammogram, which was done in January, shows no evidence of suspicious microcalcifications.  IMPRESSION:  Amy Black is a 71 year old African American female with adenosquamous carcinoma that presented on her right lower leg.  This was probably 10 years ago.  She had this resected.  She had some postop radiation therapy.  We never found where the primary site was.  So far, this has not been an issue.  We will plan to get her back in 6 more months.  I think this is reasonable for followup.    ______________________________ Josph Macho, M.D. PRE/MEDQ  D:  07/26/2011  T:  07/26/2011  Job:  [id number]

## 2011-09-30 ENCOUNTER — Other Ambulatory Visit: Payer: Self-pay | Admitting: Gastroenterology

## 2011-10-06 ENCOUNTER — Other Ambulatory Visit: Payer: Self-pay

## 2011-11-13 ENCOUNTER — Other Ambulatory Visit: Payer: Self-pay

## 2011-11-13 ENCOUNTER — Telehealth: Payer: Self-pay | Admitting: Hematology & Oncology

## 2011-11-13 NOTE — Telephone Encounter (Signed)
Received referral for pt. She is an established pt here. Dr. Myna Hidalgo went over her labs and said pt could wait until 01-25-12 to see him.Debbi at referring aware.

## 2011-12-29 ENCOUNTER — Other Ambulatory Visit: Payer: Self-pay | Admitting: Family Medicine

## 2011-12-29 DIAGNOSIS — R1011 Right upper quadrant pain: Secondary | ICD-10-CM

## 2012-01-02 ENCOUNTER — Ambulatory Visit (HOSPITAL_COMMUNITY)
Admission: RE | Admit: 2012-01-02 | Discharge: 2012-01-02 | Disposition: A | Discharge status: Home / Self Care | Payer: Medicare Other | Source: Ambulatory Visit | Attending: Family Medicine | Admitting: Family Medicine

## 2012-01-02 DIAGNOSIS — K7689 Other specified diseases of liver: Secondary | ICD-10-CM | POA: Insufficient documentation

## 2012-01-02 DIAGNOSIS — R1011 Right upper quadrant pain: Secondary | ICD-10-CM

## 2012-01-02 DIAGNOSIS — N289 Disorder of kidney and ureter, unspecified: Secondary | ICD-10-CM | POA: Insufficient documentation

## 2012-01-18 ENCOUNTER — Other Ambulatory Visit (HOSPITAL_COMMUNITY): Payer: Self-pay | Admitting: Cardiovascular Disease

## 2012-01-18 DIAGNOSIS — I739 Peripheral vascular disease, unspecified: Secondary | ICD-10-CM

## 2012-01-19 ENCOUNTER — Ambulatory Visit (HOSPITAL_COMMUNITY)
Admission: RE | Admit: 2012-01-19 | Discharge: 2012-01-19 | Disposition: A | Discharge status: Home / Self Care | Payer: Medicare Other | Source: Ambulatory Visit | Attending: Cardiovascular Disease | Admitting: Cardiovascular Disease

## 2012-01-19 DIAGNOSIS — I739 Peripheral vascular disease, unspecified: Secondary | ICD-10-CM

## 2012-01-19 NOTE — Progress Notes (Signed)
Lower extremity arterial duplex completed.  GMG 

## 2012-01-25 ENCOUNTER — Ambulatory Visit (HOSPITAL_BASED_OUTPATIENT_CLINIC_OR_DEPARTMENT_OTHER): Payer: Medicare Other | Admitting: Medical

## 2012-01-25 ENCOUNTER — Other Ambulatory Visit (HOSPITAL_BASED_OUTPATIENT_CLINIC_OR_DEPARTMENT_OTHER): Payer: Medicare Other | Admitting: Lab

## 2012-01-25 VITALS — BP 128/73 | HR 65 | Temp 98.1°F | Resp 16 | Ht 63.0 in | Wt 159.0 lb

## 2012-01-25 DIAGNOSIS — C801 Malignant (primary) neoplasm, unspecified: Secondary | ICD-10-CM

## 2012-01-25 DIAGNOSIS — Z859 Personal history of malignant neoplasm, unspecified: Secondary | ICD-10-CM

## 2012-01-25 DIAGNOSIS — M810 Age-related osteoporosis without current pathological fracture: Secondary | ICD-10-CM

## 2012-01-25 LAB — CBC WITH DIFFERENTIAL (CANCER CENTER ONLY)
BASO#: 0.1 10*3/uL (ref 0.0–0.2)
BASO%: 0.7 % (ref 0.0–2.0)
EOS%: 28.1 % — ABNORMAL HIGH (ref 0.0–7.0)
Eosinophils Absolute: 2 10*3/uL — ABNORMAL HIGH (ref 0.0–0.5)
HCT: 35.2 % (ref 34.8–46.6)
HGB: 11.8 g/dL (ref 11.6–15.9)
LYMPH#: 2 10*3/uL (ref 0.9–3.3)
LYMPH%: 28.4 % (ref 14.0–48.0)
MCH: 31.7 pg (ref 26.0–34.0)
MCHC: 33.5 g/dL (ref 32.0–36.0)
MCV: 95 fL (ref 81–101)
MONO#: 0.7 10*3/uL (ref 0.1–0.9)
MONO%: 9.3 % (ref 0.0–13.0)
NEUT#: 2.4 10*3/uL (ref 1.5–6.5)
NEUT%: 33.5 % — ABNORMAL LOW (ref 39.6–80.0)
Platelets: 375 10*3/uL (ref 145–400)
RBC: 3.72 10*6/uL (ref 3.70–5.32)
RDW: 13.8 % (ref 11.1–15.7)
WBC: 7.1 10*3/uL (ref 3.9–10.0)

## 2012-01-25 NOTE — Progress Notes (Signed)
Diagnosis: Adenosquamous carcinoma of unknown primary, remission.  Current therapy: Observation.  Interim history: Amy Black comes in today for an office followup visit.  Overall, she, reports, that she's been doing relatively well.  She did see her primary care physician for some abdominal pain, that she's been having.  An ultrasound of her abdomen was done back on 01/02/2012.  This did reveal a Bosniak II cystic mass of the mid right kidney.  She is not reporting any urinary symptoms.  She denies any hematuria.  She has been referred to Dr. Brunilda Payor at [facility].  She actually has an appointment there today.  She does not report any other new problems or complications.  She's not been placed on any new medications.  She has a good appetite.  She denies any nausea, vomiting, diarrhea, constipation, any chest pain, shortness of breath, or cough.  She denies any fevers, chills, or night sweats.  She denies any palpable topically.  She denies any odynophagia or dysphasia.  She denies any headaches, visual changes, or rashes.  She denies any lower leg swelling.  She denies any obvious, or abnormal bleeding.  Review of Systems: Constitutional:Negative for malaise/fatigue, fever, chills, weight loss, diaphoresis, activity change, appetite change, and unexpected weight change.  HEENT: Negative for double vision, blurred vision, visual loss, ear pain, tinnitus, congestion, rhinorrhea, epistaxis sore throat or sinus disease, oral pain/lesion, tongue soreness Respiratory: Negative for cough, chest tightness, shortness of breath, wheezing and stridor.  Cardiovascular: Negative for chest pain, palpitations, leg swelling, orthopnea, PND, DOE or claudication Gastrointestinal: Negative for nausea, vomiting, abdominal pain, diarrhea, constipation, blood in stool, melena, hematochezia, abdominal distention, anal bleeding, rectal pain, anorexia and hematemesis.  Genitourinary: Negative for dysuria, frequency,  hematuria,  Musculoskeletal: Negative for myalgias, back pain, joint swelling, arthralgias and gait problem.  Skin: Negative for rash, color change, pallor and wound.  Neurological:. Negative for dizziness/light-headedness, tremors, seizures, syncope, facial asymmetry, speech difficulty, weakness, numbness, headaches and paresthesias.  Hematological: Negative for adenopathy. Does not bruise/bleed easily.  Psychiatric/Behavioral:  Negative for depression, no loss of interest in normal activity or change in sleep pattern.   Physical Exam: This is a pleasant, 71 year old, well-developed, well-nourished, African American, female, in no obvious distress Vitals: Temperature 90.1 degrees, pulse 65, respirations 16, blood pressure 128/73, weight 159 pounds HEENT reveals a normocephalic, atraumatic skull, no scleral icterus, no oral lesions  Neck is supple without any cervical or supraclavicular adenopathy.  Lungs are clear to auscultation bilaterally. There are no wheezes, rales or rhonci Cardiac is regular rate and rhythm with a normal S1 and S2. There are no murmurs, rubs, or bruits.  Abdomen is soft with good bowel sounds, there is no palpable mass. There is no palpable hepatosplenomegaly. There is no palpable fluid wave.  Musculoskeletal no tenderness of the spine, ribs, or hips.  Extremities there are no clubbing, cyanosis, or edema.  Skin no petechia, purpura or ecchymosis Neurologic is nonfocal.  Laboratory Data: White count 7.1, hemoglobin 11.8, hematocrit 35.2, platelets 375,0000  Current Outpatient Prescriptions on File Prior to Visit  Medication Sig Dispense Refill  . ALPRAZolam (XANAX) 0.5 MG tablet Take 0.5 mg by mouth 2 (two) times daily as needed.        Marland Kitchen aspirin 81 MG tablet Take 81 mg by mouth daily.        . Bismuth Subsalicylate (PEPTO-BISMOL PO) Take by mouth. As needed       . diphenoxylate-atropine (LOMOTIL) 2.5-0.025 MG per tablet TAKE 1 TABLET FOUR  TIMES DAILY AS NEEDED  FOR DIARRHEA/LOOSE STOOLS  30 tablet  0  . Loperamide HCl (IMODIUM A-D PO) Take by mouth. As needed       . NITROSTAT 0.4 MG SL tablet Place 0.4 mg under the tongue every 5 (five) minutes as needed.       . pantoprazole (PROTONIX) 40 MG tablet Take 40 mg by mouth daily.        . traMADol (ULTRAM) 50 MG tablet       . triamterene-hydrochlorothiazide (DYAZIDE) 37.5-25 MG per capsule Take 1 capsule by mouth every morning.        . verapamil (CALAN) 80 MG tablet Take 80 mg by mouth 4 (four) times daily - after meals and at bedtime.       Marland Kitchen VITAMIN D, ERGOCALCIFEROL, PO Take by mouth every morning.       Assessment/Plan: This is a pleasant, 71 year old, American female, with the following issues:  #1.  History of adenosquamous carcinoma of unknown primary.  This presented on her right lower leg.  This was about 10 years ago.  She did have this resected.  She also had postop radiation therapy.  There's been no evidence of relapsed/recurrence to date.  #2.Bosniak II cystic mass on right kidney.  Amy Black is following up with Dr. Brunilda Payor at [facility] today.  #3.  Followup.  We will follow back up with Ms. Ferraz in 6 months, but before then should there be questions or concerns.

## 2012-01-26 LAB — COMPREHENSIVE METABOLIC PANEL
ALT: 9 U/L (ref 0–35)
AST: 15 U/L (ref 0–37)
Albumin: 4.3 g/dL (ref 3.5–5.2)
Alkaline Phosphatase: 83 U/L (ref 39–117)
BUN: 11 mg/dL (ref 6–23)
CO2: 30 mEq/L (ref 19–32)
Calcium: 9.7 mg/dL (ref 8.4–10.5)
Chloride: 99 mEq/L (ref 96–112)
Creatinine, Ser: 0.97 mg/dL (ref 0.50–1.10)
Glucose, Bld: 86 mg/dL (ref 70–99)
Potassium: 4.7 mEq/L (ref 3.5–5.3)
Sodium: 135 mEq/L (ref 135–145)
Total Bilirubin: 0.3 mg/dL (ref 0.3–1.2)
Total Protein: 7 g/dL (ref 6.0–8.3)

## 2012-01-26 LAB — VITAMIN D 25 HYDROXY (VIT D DEFICIENCY, FRACTURES): Vit D, 25-Hydroxy: 31 ng/mL (ref 30–89)

## 2012-01-26 LAB — LACTATE DEHYDROGENASE: LDH: 136 U/L (ref 94–250)

## 2012-02-09 ENCOUNTER — Encounter (HOSPITAL_COMMUNITY): Payer: Self-pay | Admitting: *Deleted

## 2012-02-09 DIAGNOSIS — E785 Hyperlipidemia, unspecified: Secondary | ICD-10-CM | POA: Diagnosis present

## 2012-02-09 DIAGNOSIS — R339 Retention of urine, unspecified: Secondary | ICD-10-CM | POA: Diagnosis present

## 2012-02-09 DIAGNOSIS — K589 Irritable bowel syndrome without diarrhea: Secondary | ICD-10-CM | POA: Diagnosis present

## 2012-02-09 DIAGNOSIS — Z79899 Other long term (current) drug therapy: Secondary | ICD-10-CM

## 2012-02-09 DIAGNOSIS — Z8589 Personal history of malignant neoplasm of other organs and systems: Secondary | ICD-10-CM

## 2012-02-09 DIAGNOSIS — G8310 Monoplegia of lower limb affecting unspecified side: Principal | ICD-10-CM | POA: Diagnosis present

## 2012-02-09 DIAGNOSIS — Z7982 Long term (current) use of aspirin: Secondary | ICD-10-CM

## 2012-02-09 DIAGNOSIS — I10 Essential (primary) hypertension: Secondary | ICD-10-CM | POA: Diagnosis present

## 2012-02-09 DIAGNOSIS — Z87891 Personal history of nicotine dependence: Secondary | ICD-10-CM

## 2012-02-09 DIAGNOSIS — I2510 Atherosclerotic heart disease of native coronary artery without angina pectoris: Secondary | ICD-10-CM | POA: Diagnosis present

## 2012-02-09 NOTE — ED Notes (Addendum)
Pt states that she has had trouble walking since Monday.  States she has fallen 4 times since this.  Had MRI ordered by ortho on Tuesday which showed urinary retention.  Swelling in right leg/foot.  C/o right leg/groing pain.

## 2012-02-10 ENCOUNTER — Inpatient Hospital Stay (HOSPITAL_COMMUNITY): Payer: Medicare Other

## 2012-02-10 ENCOUNTER — Inpatient Hospital Stay (HOSPITAL_COMMUNITY)
Admission: EM | Admit: 2012-02-10 | Discharge: 2012-02-13 | DRG: 093 | Disposition: A | Discharge status: Home / Self Care | Payer: Medicare Other | Attending: Internal Medicine | Admitting: Internal Medicine

## 2012-02-10 DIAGNOSIS — G959 Disease of spinal cord, unspecified: Secondary | ICD-10-CM

## 2012-02-10 DIAGNOSIS — C801 Malignant (primary) neoplasm, unspecified: Secondary | ICD-10-CM | POA: Diagnosis present

## 2012-02-10 DIAGNOSIS — K589 Irritable bowel syndrome without diarrhea: Secondary | ICD-10-CM

## 2012-02-10 DIAGNOSIS — G8220 Paraplegia, unspecified: Secondary | ICD-10-CM

## 2012-02-10 DIAGNOSIS — I2510 Atherosclerotic heart disease of native coronary artery without angina pectoris: Secondary | ICD-10-CM | POA: Diagnosis present

## 2012-02-10 DIAGNOSIS — R339 Retention of urine, unspecified: Secondary | ICD-10-CM

## 2012-02-10 DIAGNOSIS — M79609 Pain in unspecified limb: Secondary | ICD-10-CM

## 2012-02-10 DIAGNOSIS — K219 Gastro-esophageal reflux disease without esophagitis: Secondary | ICD-10-CM

## 2012-02-10 LAB — BASIC METABOLIC PANEL
BUN: 20 mg/dL (ref 6–23)
BUN: 20 mg/dL (ref 6–23)
CO2: 24 mEq/L (ref 19–32)
CO2: 28 mEq/L (ref 19–32)
Calcium: 9.8 mg/dL (ref 8.4–10.5)
Calcium: 9.3 mg/dL (ref 8.4–10.5)
Chloride: 99 mEq/L (ref 96–112)
Chloride: 102 mEq/L (ref 96–112)
Creatinine, Ser: 0.91 mg/dL (ref 0.50–1.10)
Creatinine, Ser: 0.88 mg/dL (ref 0.50–1.10)
GFR calc Af Amer: 73 mL/min — ABNORMAL LOW (ref >90)
GFR calc Af Amer: 76 mL/min — ABNORMAL LOW (ref >90)
GFR calc non Af Amer: 63 mL/min — ABNORMAL LOW (ref >90)
GFR calc non Af Amer: 66 mL/min — ABNORMAL LOW (ref >90)
Glucose, Bld: 98 mg/dL (ref 70–99)
Glucose, Bld: 103 mg/dL — ABNORMAL HIGH (ref 70–99)
Potassium: 4.7 mEq/L (ref 3.5–5.1)
Potassium: 4.3 mEq/L (ref 3.5–5.1)
Sodium: 134 mEq/L — ABNORMAL LOW (ref 135–145)
Sodium: 139 mEq/L (ref 135–145)

## 2012-02-10 LAB — CBC
HCT: 37.7 % (ref 36.0–46.0)
HCT: 37.1 % (ref 36.0–46.0)
Hemoglobin: 12.6 g/dL (ref 12.0–15.0)
Hemoglobin: 11.9 g/dL — ABNORMAL LOW (ref 12.0–15.0)
MCH: 31.3 pg (ref 26.0–34.0)
MCH: 30.1 pg (ref 26.0–34.0)
MCHC: 33.4 g/dL (ref 30.0–36.0)
MCHC: 32.1 g/dL (ref 30.0–36.0)
MCV: 93.5 fL (ref 78.0–100.0)
MCV: 93.7 fL (ref 78.0–100.0)
Platelets: 478 10*3/uL — ABNORMAL HIGH (ref 150–400)
Platelets: 421 10*3/uL — ABNORMAL HIGH (ref 150–400)
RBC: 4.03 MIL/uL (ref 3.87–5.11)
RBC: 3.96 MIL/uL (ref 3.87–5.11)
RDW: 14.1 % (ref 11.5–15.5)
RDW: 14.2 % (ref 11.5–15.5)
WBC: 11.4 10*3/uL — ABNORMAL HIGH (ref 4.0–10.5)
WBC: 11.5 10*3/uL — ABNORMAL HIGH (ref 4.0–10.5)

## 2012-02-10 LAB — URINALYSIS, ROUTINE W REFLEX MICROSCOPIC
Bilirubin Urine: NEGATIVE
Glucose, UA: NEGATIVE mg/dL
Hgb urine dipstick: NEGATIVE
Ketones, ur: NEGATIVE mg/dL
Leukocytes, UA: NEGATIVE
Nitrite: NEGATIVE
Protein, ur: NEGATIVE mg/dL
Specific Gravity, Urine: 1.011 (ref 1.005–1.030)
Urobilinogen, UA: 0.2 mg/dL (ref 0.0–1.0)
pH: 6 (ref 5.0–8.0)

## 2012-02-10 LAB — HEPARIN LEVEL (UNFRACTIONATED)
Heparin Unfractionated: 0.76 IU/mL — ABNORMAL HIGH (ref 0.30–0.70)
Heparin Unfractionated: 0.89 IU/mL — ABNORMAL HIGH (ref 0.30–0.70)

## 2012-02-10 MED ORDER — PANTOPRAZOLE SODIUM 40 MG PO TBEC
40.0000 mg | DELAYED_RELEASE_TABLET | Freq: Every day | ORAL | Status: DC
  Administered 2012-02-10 – 2012-02-13 (×4): 40 mg via ORAL
  Filled 2012-02-10: qty 1
  Filled 2012-02-10: qty 2
  Filled 2012-02-10: qty 1

## 2012-02-10 MED ORDER — GADOBENATE DIMEGLUMINE 529 MG/ML IV SOLN
13.0000 mL | Freq: Once | INTRAVENOUS | Status: AC | PRN
  Administered 2012-02-10: 13 mL via INTRAVENOUS

## 2012-02-10 MED ORDER — SODIUM CHLORIDE 0.9 % IJ SOLN
3.0000 mL | Freq: Two times a day (BID) | INTRAMUSCULAR | Status: DC
  Administered 2012-02-10 – 2012-02-12 (×4): 3 mL via INTRAVENOUS

## 2012-02-10 MED ORDER — ALPRAZOLAM 0.5 MG PO TABS
0.5000 mg | ORAL_TABLET | Freq: Two times a day (BID) | ORAL | Status: DC | PRN
  Administered 2012-02-10 – 2012-02-13 (×6): 0.5 mg via ORAL
  Filled 2012-02-10 (×2): qty 1
  Filled 2012-02-10: qty 2
  Filled 2012-02-10 (×3): qty 1

## 2012-02-10 MED ORDER — ENOXAPARIN SODIUM 150 MG/ML ~~LOC~~ SOLN: 1.0000 mg/kg | Freq: Once | SUBCUTANEOUS | Status: DC

## 2012-02-10 MED ORDER — VERAPAMIL HCL 80 MG PO TABS
80.0000 mg | ORAL_TABLET | Freq: Three times a day (TID) | ORAL | Status: DC
  Administered 2012-02-10 – 2012-02-13 (×14): 80 mg via ORAL
  Filled 2012-02-10 (×17): qty 1

## 2012-02-10 MED ORDER — ACETAMINOPHEN 325 MG PO TABS
650.0000 mg | ORAL_TABLET | Freq: Four times a day (QID) | ORAL | Status: DC | PRN
  Administered 2012-02-10: 650 mg via ORAL

## 2012-02-10 MED ORDER — HEPARIN (PORCINE) IN NACL 100-0.45 UNIT/ML-% IJ SOLN
750.0000 [IU]/h | INTRAMUSCULAR | Status: DC
  Administered 2012-02-10: 900 [IU]/h via INTRAVENOUS
  Administered 2012-02-10: 1050 [IU]/h via INTRAVENOUS
  Filled 2012-02-10 (×2): qty 250

## 2012-02-10 MED ORDER — SENNOSIDES-DOCUSATE SODIUM 8.6-50 MG PO TABS: 1.0000 | ORAL_TABLET | Freq: Every evening | ORAL | Status: DC | PRN

## 2012-02-10 MED ORDER — ASPIRIN 81 MG PO CHEW
81.0000 mg | CHEWABLE_TABLET | Freq: Every day | ORAL | Status: DC
  Administered 2012-02-10 – 2012-02-13 (×4): 81 mg via ORAL
  Filled 2012-02-10 (×4): qty 1

## 2012-02-10 MED ORDER — ENOXAPARIN SODIUM 40 MG/0.4ML ~~LOC~~ SOLN
40.0000 mg | SUBCUTANEOUS | Status: DC
  Administered 2012-02-11 – 2012-02-13 (×3): 40 mg via SUBCUTANEOUS
  Filled 2012-02-10 (×3): qty 0.4

## 2012-02-10 MED ORDER — ONDANSETRON HCL 4 MG PO TABS: 4.0000 mg | ORAL_TABLET | Freq: Four times a day (QID) | ORAL | Status: DC | PRN

## 2012-02-10 MED ORDER — SODIUM CHLORIDE 0.9 % IJ SOLN: 3.0000 mL | INTRAMUSCULAR | Status: DC | PRN

## 2012-02-10 MED ORDER — ONDANSETRON HCL 4 MG/2ML IJ SOLN: 4.0000 mg | Freq: Four times a day (QID) | INTRAMUSCULAR | Status: DC | PRN

## 2012-02-10 MED ORDER — ACETAMINOPHEN 650 MG RE SUPP: 650.0000 mg | Freq: Four times a day (QID) | RECTAL | Status: DC | PRN

## 2012-02-10 MED ORDER — HEPARIN BOLUS VIA INFUSION
3000.0000 [IU] | Freq: Once | INTRAVENOUS | Status: AC
  Administered 2012-02-10: 3000 [IU] via INTRAVENOUS

## 2012-02-10 MED ORDER — TRIAMTERENE-HCTZ 37.5-25 MG PO TABS
1.0000 | ORAL_TABLET | ORAL | Status: DC
  Administered 2012-02-12: 1 via ORAL
  Filled 2012-02-10: qty 1

## 2012-02-10 MED ORDER — NITROGLYCERIN 0.4 MG SL SUBL: 0.4000 mg | SUBLINGUAL_TABLET | SUBLINGUAL | Status: DC | PRN

## 2012-02-10 NOTE — Progress Notes (Signed)
VASCULAR LAB PRELIMINARY  PRELIMINARY  PRELIMINARY  PRELIMINARY  Bilateral lower extremity venous Dopplers completed.    Preliminary report:  There is no DVT or SVT noted in the bilateral lower extremities.  Amy Black, RVT 02/10/2012, 4:55 PM

## 2012-02-10 NOTE — H&P (Signed)
PCP:   Rudi Heap, MD   Chief Complaint:  Inability to move her right lower extremity  HPI: On Monday, December 30th the patient was driving to [facility] with her daughter for an appointment, she stated her right leg felt odd. By  the time they right and at [facility] patient was very weak in the right extremity. She called   Her orthopedic physician in [place]. She was able to see him in the office and he ordered an MRI. This which was done today-Friday . Since Monday evening the patient has been totally unable to move the right lower extremity. She reports no other neurological deficits. She was called by her orthopedics physician this evening about the results of the MRI and told to the only abnormality was she was retaining urine. The patient has a urologist, she called for an appointment and was given an appointment for Monday. She decided to come to the ER. In the ER a Foley was placed and 1200 cc of urine was drained. Since the decompression of her bladder the patient has developed myoclonic jerks in the right lower extremity. We do not have a copy of the MRI done by the orthopedic physician. The patient has not been ill, no viral-type symptoms, no fevers, chills and nausea, vomiting or diarrhea. The hospitalist was called with request to admit.   Review of Systems:  The patient denies anorexia, fever, weight loss,, vision loss, decreased hearing, hoarseness, chest pain, syncope, dyspnea on exertion, peripheral edema, balance deficits, hemoptysis, abdominal pain, melena, hematochezia, severe indigestion/heartburn, hematuria, incontinence, genital sores, muscle weakness, suspicious skin lesions, transient blindness, depression, unusual weight change, abnormal bleeding, enlarged lymph nodes, angioedema, and breast masses.  Past Medical History: Past Medical History  Diagnosis Date  . CAD (coronary artery disease)   . Irritable bowel syndrome   . Diverticulosis of colon (without mention of  hemorrhage)   . hepatic cyst   . Hyperlipidemia   . 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 HTN (hypertension)     PT. DENIES  . History of cardiac catheterization   . Unstable angina   . Adenocarcinoma     LEFT LEG  . Insomnia   . Vitamin D deficiency   . Adenocarcinoma of unknown primary 02/06/2011   Past Surgical History  Procedure Date  . Resection of leg lesion and radiation 2004  . Diagnostic laparoscopy   . Extensive lysis of adhesions   . ;eft salpingo-oophorectomy   . Abdominal hysterectomy   . Pelvic laparoscopy 1992    RSO, AND LSO ON 2007    Medications: Prior to Admission medications   Medication Sig Start Date End Date Taking? Authorizing Provider  ALPRAZolam Prudy Feeler) 0.5 MG tablet Take 0.5 mg by mouth 2 (two) times daily as needed.     Yes Historical Provider, MD  aspirin 81 MG tablet Take 81 mg by mouth daily.     Yes Historical Provider, MD  NITROSTAT 0.4 MG SL tablet Place 0.4 mg under the tongue every 5 (five) minutes as needed.  11/22/11  Yes Historical Provider, MD  pantoprazole (PROTONIX) 40 MG tablet Take 40 mg by mouth daily.     Yes Historical Provider, MD  triamterene-hydrochlorothiazide (DYAZIDE) 37.5-25 MG per capsule Take 1 capsule by mouth every Monday, Wednesday, and Friday.    Yes Historical Provider, MD  verapamil (CALAN) 80 MG tablet Take 80 mg by mouth 4 (four) times daily - after meals and at bedtime.    Yes Historical Provider, MD  Allergies:   Allergies  Allergen Reactions  . Iodine     Social History:  reports that she quit smoking about 39 years ago. Her smoking use included Cigarettes. She has never used smokeless tobacco. She reports that she does not drink alcohol or use illicit drugs.  Family History: Family History  Problem Relation Age of Onset  . Diabetes      Multiple family members on both sides   . Coronary artery disease      Multiple family members on both sides   . Cancer Mother     Bladder  . Heart disease  Mother   . Hypertension Mother   . Colon cancer Neg Hx   . Heart disease Father     Physical Exam: Filed Vitals:   02/09/12 2314  BP: 146/77  Pulse: 89  Temp: 97.8 F (36.6 C)  Resp: 20  SpO2: 98%    General:  Alert and oriented times three, well developed and nourished, no acute distress Eyes: PERRLA, pink conjunctiva, no scleral icterus ENT: Moist oral mucosa, neck supple, no thyromegaly Lungs: clear to ascultation, no wheeze, no crackles, no use of accessory muscles Cardiovascular: regular rate and rhythm, no regurgitation, no gallops, no murmurs. No carotid bruits, no JVD Abdomen: soft, positive BS, non-tender, non-distended, no organomegaly, not an acute abdomen GU: not examined Neuro: CN II - XII grossly intact, sensation intact Musculoskeletal: strength 5/5 all extremities except right lower extremity 0/5, myoclonic twitching, no clubbing, cyanosis or edema, right lower extremity calf larger than left lower extremity Skin: no rash, no subcutaneous crepitation, no decubitus Psych: appropriate patient   Labs on Admission:   Basename 02/10/12 0051  NA 134*  K 4.7  CL 99  CO2 24  GLUCOSE 98  BUN 20  CREATININE 0.91  CALCIUM 9.8  MG --  PHOS --   No results found for this basename: AST:2,ALT:2,ALKPHOS:2,BILITOT:2,PROT:2,ALBUMIN:2 in the last 72 hours No results found for this basename: LIPASE:2,AMYLASE:2 in the last 72 hours  Basename 02/10/12 0051  WBC 11.4*  NEUTROABS --  HGB 12.6  HCT 37.7  MCV 93.5  PLT 478*    Micro Results: No results found for this or any previous visit (from the past 240 hour(s)).   Radiological Exams on Admission: No results found.  Assessment/Plan Present on Admission:  . Flaccid paralysis of right leg Admit to MedSurg Question flaccid paralysis related to compression of femoral, sciatic plus other nerves coming from spine by the bladder. Patient now with twitching since bladder has been evacuated.  Neurology consulted.  Discussed with neurology who recommends MRI L-spine and pelvis with and without contrast to rule out mass. Inconsistent examination did raise questions often a psychosomatic component. Physical therapy consulted  Urinary retention For the place. Defer to a.m. team consult of urologists  Right lower extremity swelling Duplex ultrasound ordered for the a.m. Heparin drip ordered, increase possibility of DVT given patient's decreased mobility  over the past week. .  history of ADENOCARCINOMA of left leg  . CAD Stable resume home medications   Full code Not needed patient on therapeutic  heparin  Nereida Schepp 02/10/2012, 3:19 AM

## 2012-02-10 NOTE — Progress Notes (Signed)
ANTICOAGULATION CONSULT NOTE - Initial Consult  Pharmacy Consult for Heparin Indication: r/o DVT  Allergies  Allergen Reactions  . Iodine     Patient Measurements: Height: 5\' 3"  (160 cm) Weight: 159 lb (72.122 kg) IBW/kg (Calculated) : 52.4  Heparin Dosing Weight: 67.4 kg  Vital Signs: Temp: 97.8 F (36.6 C) (01/03 2314) BP: 146/77 mmHg (01/03 2314) Pulse Rate: 89  (01/03 2314)  Labs:  Basename 02/10/12 0051  HGB 12.6  HCT 37.7  PLT 478*  APTT --  LABPROT --  INR --  HEPARINUNFRC --  CREATININE 0.91  CKTOTAL --  CKMB --  TROPONINI --    Estimated Creatinine Clearance: 55.5 ml/min (by C-G formula based on Cr of 0.91).   Medical History: Past Medical History  Diagnosis Date  . CAD (coronary artery disease)   . Irritable bowel syndrome   . Diverticulosis of colon (without mention of hemorrhage)   . hepatic cyst   . Hyperlipidemia   . 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 HTN (hypertension)     PT. DENIES  . History of cardiac catheterization   . Unstable angina   . Adenocarcinoma     LEFT LEG  . Insomnia   . Vitamin D deficiency   . Adenocarcinoma of unknown primary 02/06/2011    Assessment: 72 y.o. F who presented to [facility] on 02/10/11 with lower extremity weakness and swelling. The patient had an MRI done as an outpatient -- results are unknown. Plans are to get [place] in the a.m to r/o DVT. Pharmacy has been consulted to start heparin while ruling out DVT. Hep Wt: 67.4 kg, SCr 0.91, CrCl~60-70 ml/min.  Goal of Therapy:  Heparin level 0.3-0.7 units/ml Monitor platelets by anticoagulation protocol: Yes   Plan:  1. Heparin bolus of 3000 units x 1 2. Initiate heparin drip at rate of 1050 units/hr (10.5 ml/hr) 3. Will continue to monitor for any signs/symptoms of bleeding and will follow up with heparin level in 6 hours   Georgina Pillion, PharmD, BCPS Clinical Pharmacist Pager: [phone] 02/10/2012 3:43 AM

## 2012-02-10 NOTE — Consult Note (Signed)
Reason for Consult:inability to move RLE   CC: can't move R leg  HPI: Amy Black Study is an 72 y.o. female with pmx of CAD presents with 5 day hx of inability to move her leg.  Pt states symptoms started suddenly on Monday afternoon while she was a passenger in a car while her daughter was driving.  She had sudden onset of shooting pain on R hip area that lasted less then 1 second after which she had sudden on set of weakness on whole RLE and states she can't move it.  Despite the "flacid paralysis" of RLE she says she walks, but stumbles when doing so.  No sensory deficits, no visual changes, no motor involvement of the upper extremities. Denies any saddle anesthesia. Pt has been complaining of urinary retention for past 2-3 months. Pt went to see orthopedic surgeon on Tuesday that ordered MRI L spine without contrast that as per pt came back normal but told her to come to ED because she had distended bladder.  In the ED pt had a urinary catheter placed that drained about 1200cc. Now she states she is feeling better and her leg is getting stronger. She does not appear to be bothered by the symptoms and complements staff that have been helping her out in this hospital stay.      Past Medical History  Diagnosis Date  . CAD (coronary artery disease)   . Irritable bowel syndrome   . Diverticulosis of colon (without mention of hemorrhage)   . hepatic cyst   . Hyperlipidemia   . 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 HTN (hypertension)     PT. DENIES  . History of cardiac catheterization   . Unstable angina   . Adenocarcinoma     LEFT LEG  . Insomnia   . Vitamin D deficiency   . Adenocarcinoma of unknown primary 02/06/2011    Past Surgical History  Procedure Date  . Resection of leg lesion and radiation 2004  . Diagnostic laparoscopy   . Extensive lysis of adhesions   . ;eft salpingo-oophorectomy   . Abdominal hysterectomy   . Pelvic laparoscopy 1992    RSO, AND LSO ON 2007    Family  History  Problem Relation Age of Onset  . Diabetes      Multiple family members on both sides   . Coronary artery disease      Multiple family members on both sides   . Cancer Mother     Bladder  . Heart disease Mother   . Hypertension Mother   . Colon cancer Neg Hx   . Heart disease Father     Social History:  reports that she quit smoking about 39 years ago. Her smoking use included Cigarettes. She has never used smokeless tobacco. She reports that she does not drink alcohol or use illicit drugs.  Allergies  Allergen Reactions  . Iodine     Medications: I have reviewed the patient's current medications.  ROS: History obtained from the patient  General ROS: negative for - chills, fatigue, fever, night sweats, weight gain or weight loss Psychological ROS: negative for - behavioral disorder, hallucinations, memory difficulties, mood swings or suicidal ideation Ophthalmic ROS: negative for - blurry vision, double vision, eye pain or loss of vision ENT ROS: negative for - epistaxis, nasal discharge, oral lesions, sore throat, tinnitus or vertigo Allergy and Immunology ROS: negative for - hives or itchy/watery eyes Hematological and Lymphatic ROS: negative for - bleeding problems, bruising or swollen  lymph nodes Endocrine ROS: negative for - galactorrhea, hair pattern changes, polydipsia/polyuria or temperature intolerance Respiratory ROS: negative for - cough, hemoptysis, shortness of breath or wheezing Cardiovascular ROS: pos hx of CAD, no chest pain Gastrointestinal ROS: negative for - abdominal pain, diarrhea, hematemesis, nausea/vomiting or stool incontinence Genito-Urinary ROS: negative for -urgency Musculoskeletal ROS: negative for - joint swelling or muscular weakness Neurological ROS: as noted in HPI Dermatological ROS: negative for rash and skin lesion changes   Physical Examination: Blood pressure 146/77, pulse 89, temperature 97.8 F (36.6 C), resp. rate 20, height  5\' 3"  (1.6 m), weight 72.122 kg (159 lb), SpO2 98.00%.  Neurologic Examination Mental Status: Alert, oriented, thought content appropriate.  Speech fluent without evidence of aphasia.  Able to follow 3 step commands without difficulty. Cranial Nerves: II: Discs flat bilaterally; Visual fields grossly normal, pupils equal, round, reactive to light and accommodation III,IV, VI: ptosis not present, extra-ocular motions intact bilaterally V,VII: smile symmetric, facial light touch sensation normal bilaterally VIII: hearing normal bilaterally IX,X: gag reflex present XI: bilateral shoulder shrug XII: midline tongue extension Motor: Right : Upper extremity   5/5    Left:     Upper extremity   5/5  Lower extremity   Hip flexion 1/5,   knee extension   4/5, dorsi/plantar flexion 2/5,   adduction 3/5, abduction 2/5                  Lower extremity   5/5 Tone and bulk:normal tone throughout; no atrophy noted Sensory: Pinprick and light touch intact throughout, bilaterally Deep Tendon Reflexes: 2+ and symmetric throughout Plantars: Right: downgoing   Left: downgoing Cerebellar: normal finger-to-nose, normal rapid alternating movements and normal heel-to-shin test Gait: not acessed CV: pulses palpable throughout      Laboratory Studies:   Basic Metabolic Panel:  Lab 02/10/12 1610  NA 134*  K 4.7  CL 99  CO2 24  GLUCOSE 98  BUN 20  CREATININE 0.91  CALCIUM 9.8  MG --  PHOS --    Liver Function Tests: No results found for this basename: AST:5,ALT:5,ALKPHOS:5,BILITOT:5,PROT:5,ALBUMIN:5 in the last 168 hours No results found for this basename: LIPASE:5,AMYLASE:5 in the last 168 hours No results found for this basename: AMMONIA:3 in the last 168 hours  CBC:  Lab 02/10/12 0051  WBC 11.4*  NEUTROABS --  HGB 12.6  HCT 37.7  MCV 93.5  PLT 478*    Cardiac Enzymes: No results found for this basename: CKTOTAL:5,CKMB:5,CKMBINDEX:5,TROPONINI:5 in the last 168 hours  BNP: No  components found with this basename: POCBNP:5  CBG: No results found for this basename: GLUCAP:5 in the last 168 hours  Microbiology: Results for orders placed in visit on 08/29/10  HELICOBACTER PYLORI SCREEN-BIOPSY     Status: Normal   Collection Time   08/30/10  4:59 PM      Component Value Range Status Comment   UREASE Negative  Negative Final     Coagulation Studies: No results found for this basename: LABPROT:5,INR:5 in the last 72 hours  Urinalysis:  Lab 02/10/12 0147  COLORURINE YELLOW  LABSPEC 1.011  PHURINE 6.0  GLUCOSEU NEGATIVE  HGBUR NEGATIVE  BILIRUBINUR NEGATIVE  KETONESUR NEGATIVE  PROTEINUR NEGATIVE  UROBILINOGEN 0.2  NITRITE NEGATIVE  LEUKOCYTESUR NEGATIVE    Lipid Panel:  No results found for this basename: chol, trig, hdl, cholhdl, vldl, ldlcalc    HgbA1C:  No results found for this basename: HGBA1C    Urine Drug Screen:   No results found  for this basename: labopia, cocainscrnur, labbenz, amphetmu, thcu, labbarb    Alcohol Level: No results found for this basename: ETH:2 in the last 168 hours.  Imaging: No results found.   Assessment/Plan:    72 y/o F with pmx of CAD presents with 5 day hx of inability to move her leg. Symptoms started suddenly and there is no sensory level. She appears to have urinary retention and since bladder was drained she appears to be improving.  Her exam is not consistent at times as the strength in her RLE improves significantly with encouragement. She has strong knee extension but weak hip flexion which are both femoral nerve.  She does not have sensory findings, do not think this is a demyelinating process. Very unlikely this to be a mass affect as symptoms should not start suddely and would not involve multiple nerve roots right away.  Her mood appears to be uplifted despite inability to move her leg and she states that she stumbles while walking despite 1/5 in hip flexion on my exam which would not make sense. This  appears to be psychogenic component, but would still r/out compressive cause.  - Ultrasound b/l LE to r/out DVT - Mri L spine and pelvis with and w/out gad to make sure there is no compression - Parasympathetics of urgency and bladder distention - If symptoms don't improve can consider EMG in about 5 days since it takes about 10-14 days post onset of symptoms to start seeing demyelination. - would hold of LP at this time - encouragement that her symptoms will improve on its own, and it appears as they are just from examining her.      02/10/2012, 3:57 AM

## 2012-02-10 NOTE — Progress Notes (Signed)
ANTICOAGULATION CONSULT NOTE -  Follow-Up Consult  Pharmacy Consult for Heparin Indication: r/o DVT  Allergies  Allergen Reactions  . Iodine    Labs:  Basename 02/10/12 1744 02/10/12 1019 02/10/12 0827 02/10/12 0051  HGB -- -- 11.9* 12.6  HCT -- -- 37.1 37.7  PLT -- -- 421* 478*  APTT -- -- -- --  LABPROT -- -- -- --  INR -- -- -- --  HEPARINUNFRC 0.89* 0.76* -- --  CREATININE -- -- 0.88 0.91  CKTOTAL -- -- -- --  CKMB -- -- -- --  TROPONINI -- -- -- --    Estimated Creatinine Clearance: 53.9 ml/min (by C-G formula based on Cr of 0.88).  Assessment: 72 y.o. F who presented to [facility] on 02/10/11 with lower extremity weakness and swelling. The patient had an MRI done as an outpatient -- results are unknown. Plans are to get [place] in the a.m to r/o DVT. Pharmacy has been consulted to start heparin while ruling out DVT. Hep Wt: 67.4 kg, SCr 0.91, CrCl~60-70 ml/min.  1st heparin level = 0.76 (slightly supra-therapeutic) 2nd heparin level = 0.89 (supratherapeutic  Goal of Therapy:  Heparin level 0.3-0.7 units/ml Monitor platelets by anticoagulation protocol: Yes   Plan:  1. Hold heparin x 30 minutes and decrease to 750 units/hr 2. Follow up AM heparin level, CBC 3. Dopplers are negative for DVT - ? dc  Thank you. Okey Regal, PharmD Pager: [phone]  02/10/2012 6:40 PM

## 2012-02-10 NOTE — Progress Notes (Signed)
[facility] PROGRESS NOTE  Jillisa Harris Tramell ZOX:[id number] DOB: Feb 20, 1940 DOA: 02/10/2012 PCP: Rudi Heap, MD  Brief narrative: Addendum to admission note done today 02/10/2012 Patient was seen and examined at bedside. 72 year old female with past medical history of CAD who presented with sudden onset right lower extremity weakness. The first time she has experienced the symptoms was 6 days prior to this admission while she was in a care. She saw orthopedic surgeon the following day and they have ordered MRI lumbar spine which did not reveal acute findings. She was told to come to ED due to distended bladder. Patient has foley placed. Neurology consulted for lower extremity weakness.  Assessment/Plan:  Active Problems:  Flaccid paralysis of legs  Will follow up on neurology recommendations  Recommended MRI Lumbar spine with and w/o contrast and EMG studies if symptoms do not improve  Urinary retention  Good urine output  Continue foley catheter   Code Status: full code Family Communication: family not at bedside Disposition Plan: needs  PT evaluation  Manson Passey, MD  [facility] Pager [phone]  If 7PM-7AM, please contact night-coverage [email or web address] Password [email or web address] 02/10/2012, 1:31 PM   LOS: 0 days   Consultants:  Neurology   Procedures:  None   Antibiotics:  None   HPI/Subjective: Continues to have right lower extremity weakness.  Objective: Filed Vitals:   02/10/12 0500 02/10/12 0900 02/10/12 0953 02/10/12 1251  BP: 134/85  132/77 112/73  Pulse: 70   72  Temp: 97.4 F (36.3 C)     TempSrc: Oral     Resp: 20   20  Height: 5\' 3"  (1.6 m)     Weight: 63 kg (138 lb 14.2 oz)     SpO2: 100% 100%  100%    Intake/Output Summary (Last 24 hours) at 02/10/12 1331 Last data filed at 02/10/12 0452  Gross per 24 hour  Intake      0 ml  Output   1350 ml  Net  -1350 ml    Exam:   General:  Pt is alert, follows commands appropriately, not in acute  distress  Cardiovascular: Regular rate and rhythm, S1/S2, no murmurs, no rubs, no gallops  Respiratory: Clear to auscultation bilaterally, no wheezing, no crackles, no rhonchi  Abdomen: Soft, non tender, non distended, bowel sounds present, no guarding  Extremities: No edema, pulses DP and PT palpable bilaterally  Neuro: right lower extremity no muscle strength; left lower extremity strength 5/5  Data Reviewed: Basic Metabolic Panel:  Lab 02/10/12 8295 02/10/12 0051  NA 139 134*  K 4.3 4.7  CL 102 99  CO2 28 24  GLUCOSE 103* 98  BUN 20 20  CREATININE 0.88 0.91  CALCIUM 9.3 9.8  MG -- --  PHOS -- --   Liver Function Tests: No results found for this basename: AST:5,ALT:5,ALKPHOS:5,BILITOT:5,PROT:5,ALBUMIN:5 in the last 168 hours No results found for this basename: LIPASE:5,AMYLASE:5 in the last 168 hours No results found for this basename: AMMONIA:5 in the last 168 hours CBC:  Lab 02/10/12 0827 02/10/12 0051  WBC 11.5* 11.4*  NEUTROABS -- --  HGB 11.9* 12.6  HCT 37.1 37.7  MCV 93.7 93.5  PLT 421* 478*    Studies: No results found.  Scheduled Meds:   . aspirin  81 mg Oral Daily  . pantoprazole  40 mg Oral Daily  . sodium chloride  3 mL Intravenous Q12H  . triamterene-hydrochlorothiazide  1 each Oral Q M,W,F  . verapamil  80 mg Oral TID PC &  HS   Continuous Infusions:   . heparin 900 Units/hr (02/10/12 1249)

## 2012-02-10 NOTE — ED Notes (Signed)
Pt given gingerale and crackers.  Family at bedside.  Awaiting admission.

## 2012-02-10 NOTE — Progress Notes (Signed)
Physical Therapy Evaluation Patient Details Name: Amy Black MRN: [id number] DOB: 1940/03/26 Today's Date: 02/10/2012 Time: 0822-0907 PT Time Calculation (min): 45 min  PT Assessment / Plan / Recommendation Clinical Impression  Pt is 72 yo female who experienced flaccidity of RLE 4 days ago. On MMT, she demonstrates 1/5 hip flex, 2-/5 knee flex/ ext, and no active motion at the ankle or foot. She is beginning to have involuntary jerking mvmts of the right LE. She was able to transfer bed to chair with a RW and min A, relying on the LLE and UE's, RLE unable to support any wt. Request CIR consult. PT will continue to follow.    PT Assessment  Patient needs continued PT services    Follow Up Recommendations  CIR    Does the patient have the potential to tolerate intense rehabilitation      Barriers to Discharge Inaccessible home environment 8 STE home at main entrance, she does have another entrance with only 2 steps    Equipment Recommendations  Wheelchair (measurements PT);Rolling walker with 5" wheels    Recommendations for Other Services OT consult;Rehab consult   Frequency Min 3X/week    Precautions / Restrictions Precautions Precautions: Fall Restrictions Weight Bearing Restrictions: No   Pertinent Vitals/Pain No c/o pain      Mobility  Bed Mobility Bed Mobility: Rolling Left;Left Sidelying to Sit;Sitting - Scoot to [place] of Bed Rolling Left: 4: Min assist Left Sidelying to Sit: 3: Mod assist Sitting - Scoot to Edge of Bed: 3: Mod assist Details for Bed Mobility Assistance: pt taught to use a sheet to help assist RLE as well as using LLE, still needed min A to roll and mod A to push up from bed into sitting. Unable to scoot right hip fwd to EOB, needed mod A Transfers Transfers: Sit to Stand;Stand to Sit;Stand Pivot Transfers Sit to Stand: 4: Min assist;From bed;With upper extremity assist Stand to Sit: 4: Min assist;To bed;To chair/3-in-1;With upper extremity  assist Stand Pivot Transfers: 4: Min assist Details for Transfer Assistance: vc's for pt to understand that she was going to need to rely on the left leg and UE's to hold herself up in standing. Performed transfer 3x with improvement each time. Min A to steady. Right LE unable to support body wt, right knee buckling. Pt peformed SPT with RW, vc's for hand placement with standing as well as keeping RW close during transfer.  Ambulation/Gait Ambulation/Gait Assistance: Not tested (comment) (unable ) Stairs: No Wheelchair Mobility Wheelchair Mobility: No    Shoulder Instructions     Exercises Other Exercises Other Exercises: pt instructed to continue to attempt AP's, SLR, quad sets, and HS despite decreased ability to move   PT Diagnosis: Difficulty walking;Hemiplegia dominant side;Abnormality of gait  PT Problem List: Decreased strength;Decreased range of motion;Decreased balance;Decreased mobility;Decreased coordination;Decreased knowledge of use of DME;Decreased safety awareness;Decreased knowledge of precautions;Impaired tone PT Treatment Interventions: DME instruction;Gait training;Stair training;Functional mobility training;Therapeutic activities;Therapeutic exercise;Balance training;Neuromuscular re-education;Patient/family education   PT Goals Acute Rehab PT Goals PT Goal Formulation: With patient Time For Goal Achievement: 02/24/12 Potential to Achieve Goals: Good Pt will go Supine/Side to Sit: with supervision PT Goal: Supine/Side to Sit - Progress: Goal set today Pt will go Sit to Supine/Side: with supervision PT Goal: Sit to Supine/Side - Progress: Goal set today Pt will go Sit to Stand: with supervision PT Goal: Sit to Stand - Progress: Goal set today Pt will go Stand to Sit: with supervision PT Goal: Stand to  Sit - Progress: Goal set today Pt will Transfer Bed to Chair/Chair to Bed: with supervision PT Transfer Goal: Bed to Chair/Chair to Bed - Progress: Goal set today Pt  will Ambulate: 1 - 15 feet;with rolling walker;with supervision PT Goal: Ambulate - Progress: Goal set today Pt will Perform Home Exercise Program: with supervision, verbal cues required/provided PT Goal: Perform Home Exercise Program - Progress: Goal set today  Visit Information  Last PT Received On: 02/10/12 Assistance Needed: +1    Subjective Data  Subjective: Imy leg is starting to move and jerk around Patient Stated Goal: return home   Prior Functioning  Home Living Lives With: Spouse;Daughter Available Help at Discharge: Family;Available PRN/intermittently Type of Home: House Home Access: Stairs to enter [employer] of Steps: 8 Entrance Stairs-Rails: Right Home Layout: Two level;Able to live on main level with bedroom/bathroom Bathroom Shower/Tub: Tub/shower unit;Curtain Firefighter: Standard Home Adaptive Equipment: None Additional Comments: pt's husband works first shift and daughter works second so she has someone with her nearly all the time Prior Function Level of Independence: Independent Able to Take Stairs?: Yes Vocation: Retired Musician: No difficulties Dominant Hand: Right    Cognition  Overall Cognitive Status: Appears within functional limits for tasks assessed/performed Arousal/Alertness: Lethargic (had just woken) Orientation Level: Oriented X4 / Intact Behavior During Session: Flat affect Cognition - Other Comments: pt with decreased insight into limitations but seems mostly due to her being in bed past several days and not trying to functionally use the right leg    Extremity/Trunk Assessment Right Upper Extremity Assessment RUE ROM/Strength/Tone: Deficits RUE ROM/Strength/Tone Deficits: pt with decreased use of the RUE on eval, grip strength equal to left but shoulder flexoin limited, pt reports that it is because of IV on right side and she is afraid to use it/ move it. She states that it feels normal to her RUE  Sensation: WFL - Light Touch Left Upper Extremity Assessment LUE ROM/Strength/Tone: WFL for tasks assessed LUE Sensation: WFL - Light Touch Right Lower Extremity Assessment RLE ROM/Strength/Tone: Deficits RLE ROM/Strength/Tone Deficits: no active mvmt noted at the ankle or foot, occasional jerking, uncontrolled. Knee ext 2-/5, knee flex 2-/5, hip flex 1/5, hip abd 0/5, hip add 2-/5. MMT yielded voluntary muscle activation but pt also experienced involuntary mvmt throughout the RLE  RLE Sensation: WFL - Light Touch Left Lower Extremity Assessment LLE ROM/Strength/Tone: Within functional levels LLE Sensation: WFL - Light Touch;WFL - Proprioception LLE Coordination: WFL - gross motor Trunk Assessment Trunk Assessment: Normal   Balance Balance Balance Assessed: Yes Static Sitting Balance Static Sitting - Balance Support: Left upper extremity supported;Feet supported Static Sitting - Level of Assistance: 6: Modified independent (Device/Increase time) Static Standing Balance Static Standing - Balance Support: Bilateral upper extremity supported;During functional activity Static Standing - Level of Assistance: 4: Min assist  End of Session PT - End of Session Equipment Utilized During Treatment: Gait belt Activity Tolerance: Patient tolerated treatment well Patient left: in chair;with call bell/phone within reach Nurse Communication: Mobility status  GP   Lyanne Co, PT  Acute Rehab Services  [phone]   Lyanne Co 02/10/2012, 9:26 AM

## 2012-02-10 NOTE — ED Notes (Signed)
Report given to elizabeth, rn.  Pt transported via stretcher with monitor attached.  Amy Black.

## 2012-02-10 NOTE — Progress Notes (Signed)
ANTICOAGULATION CONSULT NOTE -  Follow-Up Consult  Pharmacy Consult for Heparin Indication: r/o DVT  Allergies  Allergen Reactions  . Iodine    Labs:  Basename 02/10/12 1019 02/10/12 0827 02/10/12 0051  HGB -- 11.9* 12.6  HCT -- 37.1 37.7  PLT -- 421* 478*  APTT -- -- --  LABPROT -- -- --  INR -- -- --  HEPARINUNFRC 0.76* -- --  CREATININE -- 0.88 0.91  CKTOTAL -- -- --  CKMB -- -- --  TROPONINI -- -- --    Estimated Creatinine Clearance: 53.9 ml/min (by C-G formula based on Cr of 0.88).  Assessment: 72 y.o. F who presented to [facility] on 02/10/11 with lower extremity weakness and swelling. The patient had an MRI done as an outpatient -- results are unknown. Plans are to get [place] in the a.m to r/o DVT. Pharmacy has been consulted to start heparin while ruling out DVT. Hep Wt: 67.4 kg, SCr 0.91, CrCl~60-70 ml/min.  1st heparin level = 0.76 (slightly supra-therapeutic  Goal of Therapy:  Heparin level 0.3-0.7 units/ml Monitor platelets by anticoagulation protocol: Yes   Plan:  1. Decrease heparin to 900 units / hr 2. Recheck 6 hour heparin level  Thank you. Okey Regal, PharmD Pager: [phone]  02/10/2012 11:05 AM

## 2012-02-10 NOTE — ED Provider Notes (Addendum)
History     CSN: [id number]  Arrival date & time 02/09/12  2304   First MD Initiated Contact with Patient 02/10/12 0018      Chief Complaint  Patient presents with  . Extremity Weakness    The patient reports weakness of her right lower extremity for the past 3 days.  She's been unable to ambulate and get out of bed.  She reports she cannot move her right leg at all.  Decreased sensation in her right foot.  She also notes increased swelling of her right lower extremity.  No history of DVT or pulmonary embolism.  She has no pain in her right leg.  She reports she develops pain in her low back several days ago.  She was seen by her orthopedic surgeon today who did an MRI of her lumbar spine for which the results are not available.  The patient reports she was called by the orthopedic surgeons nurse and was told that she had enlarged bladder which was the cause of her symptoms and that she needed to present to the emergency department.  No further information regarding her lumbar spine MRI was available.  The patient denies peroneal numbness.  She has no weakness of her left lower extremity.   The history is provided by the patient.    Past Medical History  Diagnosis Date  . CAD (coronary artery disease)   . Irritable bowel syndrome   . Diverticulosis of colon (without mention of hemorrhage)   . hepatic cyst   . Hyperlipidemia   . 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 HTN (hypertension)     PT. DENIES  . History of cardiac catheterization   . Unstable angina   . Adenocarcinoma     LEFT LEG  . Insomnia   . Vitamin D deficiency   . Adenocarcinoma of unknown primary 02/06/2011    Past Surgical History  Procedure Date  . Resection of leg lesion and radiation 2004  . Diagnostic laparoscopy   . Extensive lysis of adhesions   . ;eft salpingo-oophorectomy   . Abdominal hysterectomy   . Pelvic laparoscopy 1992    RSO, AND LSO ON 2007    Family History  Problem Relation Age of  Onset  . Diabetes      Multiple family members on both sides   . Coronary artery disease      Multiple family members on both sides   . Cancer Mother     Bladder  . Heart disease Mother   . Hypertension Mother   . Colon cancer Neg Hx   . Heart disease Father     History  Substance Use Topics  . Smoking status: Former Smoker    Types: Cigarettes    Quit date: 11/20/1972  . Smokeless tobacco: Never Used  . Alcohol Use: No    OB History    Grav Para Term Preterm Abortions TAB SAB Ect Mult Living   3 3        3       Review of Systems  All other systems reviewed and are negative.    Allergies  Iodine  Home Medications   Current Outpatient Rx  Name  Route  Sig  Dispense  Refill  . ALPRAZOLAM 0.5 MG PO TABS   Oral   Take 0.5 mg by mouth 2 (two) times daily as needed.           . ASPIRIN 81 MG PO TABS   Oral   Take 81  mg by mouth daily.           Marland Kitchen NITROSTAT 0.4 MG SL SUBL   Sublingual   Place 0.4 mg under the tongue every 5 (five) minutes as needed.          Marland Kitchen PANTOPRAZOLE SODIUM 40 MG PO TBEC   Oral   Take 40 mg by mouth daily.           . TRIAMTERENE-HCTZ 37.5-25 MG PO CAPS   Oral   Take 1 capsule by mouth every Monday, Wednesday, and Friday.          Marland Kitchen VERAPAMIL HCL 80 MG PO TABS   Oral   Take 80 mg by mouth 4 (four) times daily - after meals and at bedtime.            BP 146/77  Pulse 89  Temp 97.8 F (36.6 C)  Resp 20  SpO2 98%  Physical Exam  Nursing note and vitals reviewed. Constitutional: She is oriented to person, place, and time. She appears well-developed and well-nourished. No distress.  HENT:  Head: Normocephalic and atraumatic.  Eyes: EOM are normal.  Neck: Normal range of motion.  Cardiovascular: Normal rate, regular rhythm and normal heart sounds.   Pulmonary/Chest: Effort normal and breath sounds normal.  Abdominal: Soft. She exhibits no distension.       Suprapubic distention  Musculoskeletal:       Flaccid  right lower extremity as compared to left.  She does have normal PT and DP pulse in her right foot.  There is swelling of her right lower extremity as compared to her left without any mottling.  Normal right femoral pulse.  Bedside ultrasound demonstrates normal venous compressibility in her proximal deep veins  Neurological: She is alert and oriented to person, place, and time.  Skin: Skin is warm and dry.  Psychiatric: She has a normal mood and affect. Judgment normal.    ED Course  Procedures (including critical care time)  Labs Reviewed  CBC - Abnormal; Notable for the following:    WBC 11.4 (*)     Platelets 478 (*)     All other components within normal limits  BASIC METABOLIC PANEL - Abnormal; Notable for the following:    Sodium 134 (*)     GFR calc non Af Amer 63 (*)     GFR calc Af Amer 73 (*)     All other components within normal limits  URINALYSIS, ROUTINE W REFLEX MICROSCOPIC  URINE CULTURE   No results found.   1. Myelopathy   2. Urinary retention       MDM  1:54 AM After decompression of her bladder the patient now has some early movement of her right lower extremity.  Clinically this is not cauda equina.  The patient be admitted the hospital for ongoing monitoring of her myelopathy which seems to be improving nearly immediately after decompression of her bladder.  Unclear etiology of urinary retention.  Urinalysis pending.  The patient will be admitted for observation.  MRI scan of the patient's lumbar spine was obtained earlier today and this will need to be reviewed by the admitting team in the morning.  I discussed her care with Dr. Ave Filter of orthopedics who works with Dr. Yevette Edwards who was unable to obtain the results of the images from home tonight.  Likely [place] in the AM to evaluate for DVT. No indication for emergent MRI in the middle of the night  Lyanne Co, MD 02/10/12 0200  Lyanne Co, MD 02/10/12 [phone]

## 2012-02-11 ENCOUNTER — Inpatient Hospital Stay (HOSPITAL_COMMUNITY): Payer: Medicare Other

## 2012-02-11 DIAGNOSIS — R339 Retention of urine, unspecified: Secondary | ICD-10-CM

## 2012-02-11 LAB — URINE CULTURE: Colony Count: >=100000

## 2012-02-11 MED ORDER — SIMETHICONE 80 MG PO CHEW
80.0000 mg | CHEWABLE_TABLET | Freq: Four times a day (QID) | ORAL | Status: DC | PRN
  Administered 2012-02-11 – 2012-02-13 (×2): 80 mg via ORAL
  Filled 2012-02-11 (×4): qty 1

## 2012-02-11 NOTE — Progress Notes (Signed)
Subjective: Patient reports there is no change in her RLE weakness/numbness.  Foley remains in place. Does not describe any pain.  Repeat MRI of the lumbar spine was unremarkable.    Objective: Current vital signs: BP 114/75  Pulse 64  Temp 97.4 F (36.3 C) (Oral)  Resp 15  Ht 5\' 3"  (1.6 m)  Wt 63 kg (138 lb 14.2 oz)  BMI 24.60 kg/m2  SpO2 98% Vital signs in last 24 hours: Temp:  [97.4 F (36.3 C)-98 F (36.7 C)] 97.4 F (36.3 C) (01/05 0517) Pulse Rate:  [64-73] 64  (01/05 0517) Resp:  [15-16] 15  (01/05 0517) BP: (114-129)/(73-82) 114/75 mmHg (01/05 1348) SpO2:  [98 %-100 %] 98 % (01/05 0517)  Intake/Output from previous day: 01/04 0701 - 01/05 0700 In: -  Out: 1000 [Urine:1000] Intake/Output this shift:   Nutritional status: Cardiac  Neurologic Exam: Mental Status:  Alert, oriented, thought content appropriate. Speech fluent without evidence of aphasia. Able to follow 3 step commands without difficulty.  Cranial Nerves:  II: Discs flat bilaterally; Visual fields grossly normal, pupils equal, round, reactive to light and accommodation  III,IV, VI: ptosis not present, extra-ocular motions intact bilaterally  V,VII: smile symmetric, facial light touch sensation normal bilaterally  VIII: hearing normal bilaterally  IX,X: gag reflex present  XI: bilateral shoulder shrug  XII: midline tongue extension  Motor:  Right : Upper extremity 5/5            Left: Upper extremity 5/5   Lower extremity Hip flexion 0/5, 1-2/5 knee flexion, 0/5 otherwise in the lower extremity.            Lower extremity 5/5  Tone and bulk:normal tone throughout; no atrophy noted  Sensory: Decreased sensation in the RLE below the knee Deep Tendon Reflexes: 2+ in the upper extremities, 1+ at the knees and absent at the ankles  Plantars:  Right: equivocal     Left: equivocal Cerebellar:  normal finger-to-nose   Lab Results: Basic Metabolic Panel:  Lab 02/10/12 7829 02/10/12 0051  NA 139 134*    K 4.3 4.7  CL 102 99  CO2 28 24  GLUCOSE 103* 98  BUN 20 20  CREATININE 0.88 0.91  CALCIUM 9.3 9.8  MG -- --  PHOS -- --    Liver Function Tests: No results found for this basename: AST:5,ALT:5,ALKPHOS:5,BILITOT:5,PROT:5,ALBUMIN:5 in the last 168 hours No results found for this basename: LIPASE:5,AMYLASE:5 in the last 168 hours No results found for this basename: AMMONIA:3 in the last 168 hours  CBC:  Lab 02/10/12 0827 02/10/12 0051  WBC 11.5* 11.4*  NEUTROABS -- --  HGB 11.9* 12.6  HCT 37.1 37.7  MCV 93.7 93.5  PLT 421* 478*    Cardiac Enzymes: No results found for this basename: CKTOTAL:5,CKMB:5,CKMBINDEX:5,TROPONINI:5 in the last 168 hours  Lipid Panel: No results found for this basename: CHOL:5,TRIG:5,HDL:5,CHOLHDL:5,VLDL:5,LDLCALC:5 in the last 168 hours  CBG: No results found for this basename: GLUCAP:5 in the last 168 hours  Microbiology: Results for orders placed during the hospital encounter of 02/10/12  URINE CULTURE     Status: Normal   Collection Time   02/10/12  1:47 AM      Component Value Range Status Comment   Specimen Description URINE, RANDOM   Final    Special Requests NONE   Final    Culture  Setup Time 02/10/2012 11:16   Final    Colony Count >=100,000 COLONIES/ML   Final    Culture  Final    Value: DIPHTHEROIDS(CORYNEBACTERIUM SPECIES)     Note: Standardized susceptibility testing for this organism is not available.   Report Status 02/11/2012 FINAL   Final     Coagulation Studies: No results found for this basename: LABPROT:5,INR:5 in the last 72 hours  Imaging: Mr Lumbar Spine W Contrast  02/10/2012  *RADIOLOGY REPORT*  Clinical Data: Right leg weakness.  MRI LUMBAR SPINE WITH CONTRAST  Technique:  Multiplanar and multiecho pulse sequences of the lumbar spine were obtained with intravenous contrast.  Contrast: 13mL MULTIHANCE GADOBENATE DIMEGLUMINE 529 MG/ML IV SOLN  Comparison: Unenhanced lumbar MRI from yesterday  Findings: Image  quality degraded by motion.  Normal lumbar alignment.  Negative for fracture or mass.  No enhancing lesion is seen.  No evidence of epidural abscess.  No bone marrow edema is present.  Interval decompression of the urinary bladder and right renal collecting system.  IMPRESSION: No acute abnormality.  No   fracture or mass.  Negative for epidural abscess.   Original Report Authenticated By: Janeece Riggers, M.D.    Mr Pelvis W Wo Contrast  02/11/2012  *RADIOLOGY REPORT*  Clinical Data: Right leg weakness acutely, urinary bladder distention on prior exam  MRI PELVIS WITHOUT AND WITH CONTRAST  Technique:  Multiplanar multisequence MR imaging of the pelvis was performed both before and after administration of intravenous contrast.  Contrast: 13mL MULTIHANCE GADOBENATE DIMEGLUMINE 529 MG/ML IV SOLN  Comparison: [facility] lumbar spine MRI 02/09/2012  Findings: Bladder is partially decompressed with a Foley catheter in place.  Evidence of prior hysterectomy and oophorectomy bilaterally.  No pelvic free fluid or lymphadenopathy. Osseous structures are within normal limits.  Postcontrast images are mildly degraded by motion but no abnormal enhancing mass is identified.  Moderate amount of stool within nondilated rectum.  A few colonic diverticuli are noted without surrounding edema or colonic wall thickening to suggest diverticulitis.  IMPRESSION: No enhancing mass or other abnormality to explain the previous finding of urinary bladder distention or right lower extremity weakness.   Original Report Authenticated By: Christiana Pellant, M.D.     Medications:  I have reviewed the patient's current medications. Scheduled:   . aspirin  81 mg Oral Daily  . enoxaparin (LOVENOX) injection  40 mg Subcutaneous Q24H  . pantoprazole  40 mg Oral Daily  . sodium chloride  3 mL Intravenous Q12H  . triamterene-hydrochlorothiazide  1 each Oral Q M,W,F  . verapamil  80 mg Oral TID PC & HS     Assessment/Plan:  Patient Active Hospital Problem List: RLE weakness/urinary retention (02/10/2012)   Assessment: Etiology for presentation remains unclear.  Exam somewhat confusing as well.  It does not seem to be consistent and shows evidence of upper motor neuron and lower motor neuron findings.  Lumbar spine unremarkable.   Plan: 1.  MRI of the brain and cervical spine to be performed  2.  Once above results reviewed will determine need for LP.  3.  Therapy    LOS: 1 day   Thana Farr, MD [facility] [phone] 02/11/2012  2:07 PM

## 2012-02-11 NOTE — Progress Notes (Signed)
Physical Therapy Treatment Patient Details Name: Amy Black MRN: [id number] DOB: Mar 19, 1940 Today's Date: 02/11/2012 Time: 1535-1610 PT Time Calculation (min): 35 min  PT Assessment / Plan / Recommendation Comments on Treatment Session  Admitted with RLE flaccidity; Making good progress with activity tolerance and prgoressive ambulation; continue to recommend considering CIR    Follow Up Recommendations  CIR     Does the patient have the potential to tolerate intense rehabilitation     Barriers to Discharge        Equipment Recommendations  Wheelchair (measurements PT);Rolling walker with 5" wheels    Recommendations for Other Services OT consult;Rehab consult  Frequency Min 3X/week   Plan Discharge plan remains appropriate    Precautions / Restrictions Precautions Precautions: Fall   Pertinent Vitals/Pain no apparent distress     Mobility  Bed Mobility Bed Mobility: Rolling Left;Left Sidelying to Sit;Sitting - Scoot to Edge of Bed Rolling Left: 4: Min assist Left Sidelying to Sit: 4: Min assist;With rails Sitting - Scoot to Edge of Bed: 4: Min assist;With rail Details for Bed Mobility Assistance: Cues for technique; Overall good progress Transfers Transfers: Sit to Stand;Stand to [employer] Transfers Sit to Stand: 4: Min assist;From bed;With upper extremity assist Stand to Sit: 4: Min assist;To bed;To chair/3-in-1;With upper extremity assist Stand Pivot Transfers: 4: Min assist Details for Transfer Assistance: Cues for technique and safety, and to preposition for transfer by scooting to EOB in prep; Overall good, safe reliance on stronger LLE; Tactile cueing to shfift weight to R a bit more Ambulation/Gait Ambulation/Gait Assistance: 3: Mod assist (with grandson pushing chair behind) Ambulation Distance (Feet): 22 Feet Assistive device: Rolling walker Ambulation/Gait Assistance Details: Step-by-step cues for gait sequence; tactile cues to activate R quad for  greater stnce stability; required hands-on R knee for stability in stance, and pt buckle about 50-60% of the time; Pt seemed quite pleased with her progress; Required physical assist to advance RLE Gait Pattern: Step-to pattern;Decreased step length - right;Decreased stance time - right    Exercises     PT Diagnosis:    PT Problem List:   PT Treatment Interventions:     PT Goals Acute Rehab PT Goals PT Goal Formulation: With patient Time For Goal Achievement: 02/24/12 Potential to Achieve Goals: Good Pt will go Supine/Side to Sit: with supervision PT Goal: Supine/Side to Sit - Progress: Progressing toward goal Pt will go Sit to Stand: with supervision PT Goal: Sit to Stand - Progress: Progressing toward goal Pt will go Stand to Sit: with supervision Pt will Transfer Bed to Chair/Chair to Bed: with supervision PT Transfer Goal: Bed to Chair/Chair to Bed - Progress: Progressing toward goal Pt will Ambulate: 1 - 15 feet;with rolling walker;with supervision PT Goal: Ambulate - Progress: Progressing toward goal  Visit Information  Last PT Received On: 02/11/12 Assistance Needed: +1 (second person helpful to push chair behind)    Subjective Data  Subjective: "I havent' been OOB all day" Patient Stated Goal: return home   Cognition  Overall Cognitive Status: Appears within functional limits for tasks assessed/performed Arousal/Alertness: Awake/alert Orientation Level: Oriented X4 / Intact Behavior During Session: [facility] for tasks performed    Balance     End of Session PT - End of Session Equipment Utilized During Treatment: Gait belt Activity Tolerance: Patient tolerated treatment well Patient left: with call bell/phone within reach (on [facility]) Nurse Communication: Mobility status   GP     Amy Black, Amy Black [phone]  02/11/2012,  6:08 PM

## 2012-02-11 NOTE — Progress Notes (Signed)
[facility] PROGRESS NOTE  Amy Black ZOX:[id number] DOB: Nov 01, 1940 DOA: 02/10/2012 PCP: Rudi Heap, MD  Brief narrative: Addendum to admission note done today 02/10/2012 Patient was seen and examined at bedside. 72 year old female with past medical history of CAD who presented with sudden onset right lower extremity weakness. The first time she has experienced the symptoms was 6 days prior to this admission while she was in a care. She saw orthopedic surgeon the following day and they have ordered MRI lumbar spine which did not reveal acute findings. She was told to come to ED due to distended bladder. Patient has foley placed. Neurology consulted for lower extremity weakness.  Assessment/Plan:  Active Problems:  Flaccid paralysis of legs  Will follow up on neurology recommendations  MRI Lumbar spine with and w/o contrast was unremarkable  Prelim read of LE dopplars was negative for DVT. On hep ggt while final read is pending   Consider EMG studies if symptoms do not improve  Urinary retention  Good urine output  Continue foley catheter   Code Status: full code Family Communication: family not at bedside Disposition Plan: needs  PT evaluation  Alysia Penna, MD  [facility] Pager [phone]  If 7PM-7AM, please contact night-coverage [email or web address] Password [email or web address] 02/11/2012, 10:40 AM   LOS: 1 day   Consultants:  Neurology   Procedures:  None   Antibiotics:  None   HPI/Subjective: Continues to have right lower extremity weakness.  Objective: Filed Vitals:   02/10/12 1348 02/10/12 2015 02/10/12 2228 02/11/12 0517  BP: 112/65 129/82 118/73 121/74  Pulse: 77 73  64  Temp: 97.7 F (36.5 C) 98 F (36.7 C)  97.4 F (36.3 C)  TempSrc: Oral Oral  Oral  Resp: 20 16  15   Height:      Weight:      SpO2: 100% 100%  98%    Intake/Output Summary (Last 24 hours) at 02/11/12 1040 Last data filed at 02/10/12 2300  Gross per 24 hour  Intake      0 ml  Output    1000 ml  Net  -1000 ml    Exam:   General:  Pt is alert, follows commands appropriately, not in acute distress  Cardiovascular: Regular rate and rhythm, S1/S2, no murmurs, no rubs, no gallops  Respiratory: Clear to auscultation bilaterally, no wheezing, no crackles, no rhonchi  Abdomen: Soft, non tender, non distended, bowel sounds present, no guarding  Extremities: No edema, pulses DP and PT palpable bilaterally  Neuro: right lower extremity has 1/5 strength; left lower extremity strength 5/5  Data Reviewed: Basic Metabolic Panel:  Lab 02/10/12 8295 02/10/12 0051  NA 139 134*  K 4.3 4.7  CL 102 99  CO2 28 24  GLUCOSE 103* 98  BUN 20 20  CREATININE 0.88 0.91  CALCIUM 9.3 9.8  MG -- --  PHOS -- --   Liver Function Tests: No results found for this basename: AST:5,ALT:5,ALKPHOS:5,BILITOT:5,PROT:5,ALBUMIN:5 in the last 168 hours No results found for this basename: LIPASE:5,AMYLASE:5 in the last 168 hours No results found for this basename: AMMONIA:5 in the last 168 hours CBC:  Lab 02/10/12 0827 02/10/12 0051  WBC 11.5* 11.4*  NEUTROABS -- --  HGB 11.9* 12.6  HCT 37.1 37.7  MCV 93.7 93.5  PLT 421* 478*    Studies: Mr Lumbar Spine W Contrast  02/10/2012  *RADIOLOGY REPORT*  Clinical Data: Right leg weakness.  MRI LUMBAR SPINE WITH CONTRAST  Technique:  Multiplanar and multiecho pulse sequences of  the lumbar spine were obtained with intravenous contrast.  Contrast: 13mL MULTIHANCE GADOBENATE DIMEGLUMINE 529 MG/ML IV SOLN  Comparison: Unenhanced lumbar MRI from yesterday  Findings: Image quality degraded by motion.  Normal lumbar alignment.  Negative for fracture or mass.  No enhancing lesion is seen.  No evidence of epidural abscess.  No bone marrow edema is present.  Interval decompression of the urinary bladder and right renal collecting system.  IMPRESSION: No acute abnormality.  No   fracture or mass.  Negative for epidural abscess.   Original Report Authenticated By:  Janeece Riggers, M.D.    Mr Pelvis W Wo Contrast  02/11/2012  *RADIOLOGY REPORT*  Clinical Data: Right leg weakness acutely, urinary bladder distention on prior exam  MRI PELVIS WITHOUT AND WITH CONTRAST  Technique:  Multiplanar multisequence MR imaging of the pelvis was performed both before and after administration of intravenous contrast.  Contrast: 13mL MULTIHANCE GADOBENATE DIMEGLUMINE 529 MG/ML IV SOLN  Comparison: [facility] lumbar spine MRI 02/09/2012  Findings: Bladder is partially decompressed with a Foley catheter in place.  Evidence of prior hysterectomy and oophorectomy bilaterally.  No pelvic free fluid or lymphadenopathy. Osseous structures are within normal limits.  Postcontrast images are mildly degraded by motion but no abnormal enhancing mass is identified.  Moderate amount of stool within nondilated rectum.  A few colonic diverticuli are noted without surrounding edema or colonic wall thickening to suggest diverticulitis.  IMPRESSION: No enhancing mass or other abnormality to explain the previous finding of urinary bladder distention or right lower extremity weakness.   Original Report Authenticated By: Christiana Pellant, M.D.     Scheduled Meds:    . aspirin  81 mg Oral Daily  . enoxaparin (LOVENOX) injection  40 mg Subcutaneous Q24H  . pantoprazole  40 mg Oral Daily  . sodium chloride  3 mL Intravenous Q12H  . triamterene-hydrochlorothiazide  1 each Oral Q M,W,F  . verapamil  80 mg Oral TID PC & HS   Continuous Infusions:

## 2012-02-12 ENCOUNTER — Inpatient Hospital Stay (HOSPITAL_COMMUNITY): Payer: Medicare Other

## 2012-02-12 DIAGNOSIS — K219 Gastro-esophageal reflux disease without esophagitis: Secondary | ICD-10-CM

## 2012-02-12 DIAGNOSIS — G9589 Other specified diseases of spinal cord: Secondary | ICD-10-CM

## 2012-02-12 DIAGNOSIS — C801 Malignant (primary) neoplasm, unspecified: Secondary | ICD-10-CM

## 2012-02-12 LAB — SEDIMENTATION RATE: Sed Rate: 23 mm/hr — ABNORMAL HIGH (ref 0–22)

## 2012-02-12 MED ORDER — GADOBENATE DIMEGLUMINE 529 MG/ML IV SOLN: 15.0000 mL | Freq: Once | INTRAVENOUS | Status: AC | PRN

## 2012-02-12 NOTE — Progress Notes (Signed)
Subjective: Patient with continued RLE weakness.  MRI of the brain and cervical spine are unremarkable.  Ambulating using a walker with therapy.  Objective: Current vital signs: BP 101/66  Pulse 78  Temp 97.8 F (36.6 C) (Oral)  Resp 20  Ht 5\' 3"  (1.6 m)  Wt 63 kg (138 lb 14.2 oz)  BMI 24.60 kg/m2  SpO2 100% Vital signs in last 24 hours: Temp:  [97.8 F (36.6 C)-98.6 F (37 C)] 97.8 F (36.6 C) (01/06 1554) Pulse Rate:  [78-85] 78  (01/06 1554) Resp:  [20] 20  (01/06 1554) BP: (101-125)/(66-75) 101/66 mmHg (01/06 1554) SpO2:  [98 %-100 %] 100 % (01/06 1554)  Intake/Output from previous day: 01/05 0701 - 01/06 0700 In: 520 [P.O.:520] Out: 1500 [Urine:1500] Intake/Output this shift: Total I/O In: 120 [P.O.:120] Out: -  Nutritional status: Cardiac  Neurologic Exam: Mental Status:  Alert, oriented, thought content appropriate. Speech fluent without evidence of aphasia. Able to follow 3 step commands without difficulty.  Cranial Nerves:  II: Discs flat bilaterally; Visual fields grossly normal, pupils equal, round, reactive to light and accommodation  III,IV, VI: ptosis not present, extra-ocular motions intact bilaterally  V,VII: smile symmetric, facial light touch sensation normal bilaterally  VIII: hearing normal bilaterally  IX,X: gag reflex present  XI: bilateral shoulder shrug  XII: midline tongue extension  Motor:  Right : Upper extremity 5/5 Left: Upper extremity 5/5  Lower extremity Hip flexion 0/5, 1-2/5 knee flexion, 0/5 otherwise in the lower extremity. Lower extremity 5/5  Tone and bulk:normal tone throughout; no atrophy noted  Sensory: Decreased sensation in the RLE below the knee  Deep Tendon Reflexes: 2+ in the upper extremities, 1+ at the knees and absent at the ankles  Plantars:  Right: equivocal Left: equivocal  Cerebellar:  normal finger-to-nose    Lab Results: Basic Metabolic Panel:  Lab 02/10/12 1191 02/10/12 0051  NA 139 134*  K 4.3 4.7    CL 102 99  CO2 28 24  GLUCOSE 103* 98  BUN 20 20  CREATININE 0.88 0.91  CALCIUM 9.3 9.8  MG -- --  PHOS -- --    Liver Function Tests: No results found for this basename: AST:5,ALT:5,ALKPHOS:5,BILITOT:5,PROT:5,ALBUMIN:5 in the last 168 hours No results found for this basename: LIPASE:5,AMYLASE:5 in the last 168 hours No results found for this basename: AMMONIA:3 in the last 168 hours  CBC:  Lab 02/10/12 0827 02/10/12 0051  WBC 11.5* 11.4*  NEUTROABS -- --  HGB 11.9* 12.6  HCT 37.1 37.7  MCV 93.7 93.5  PLT 421* 478*    Cardiac Enzymes: No results found for this basename: CKTOTAL:5,CKMB:5,CKMBINDEX:5,TROPONINI:5 in the last 168 hours  Lipid Panel: No results found for this basename: CHOL:5,TRIG:5,HDL:5,CHOLHDL:5,VLDL:5,LDLCALC:5 in the last 168 hours  CBG: No results found for this basename: GLUCAP:5 in the last 168 hours  Microbiology: Results for orders placed during the hospital encounter of 02/10/12  URINE CULTURE     Status: Normal   Collection Time   02/10/12  1:47 AM      Component Value Range Status Comment   Specimen Description URINE, RANDOM   Final    Special Requests NONE   Final    Culture  Setup Time 02/10/2012 11:16   Final    Colony Count >=100,000 COLONIES/ML   Final    Culture     Final    Value: DIPHTHEROIDS(CORYNEBACTERIUM SPECIES)     Note: Standardized susceptibility testing for this organism is not available.   Report Status 02/11/2012  FINAL   Final     Coagulation Studies: No results found for this basename: LABPROT:5,INR:5 in the last 72 hours  Imaging: Mr Brain Wo Contrast  02/12/2012  *RADIOLOGY REPORT*  Clinical Data:  Right lower extremity weakness  MRI HEAD WITHOUT CONTRAST MRI CERVICAL SPINE WITHOUT CONTRAST  Technique:  Multiplanar, multiecho pulse sequences of the brain and surrounding structures, and cervical spine, to include the craniocervical junction and cervicothoracic junction, were obtained without intravenous contrast.   Comparison:  Head CT 08/19/2007  MRI HEAD  Findings:  Diffusion imaging does not show any acute or subacute infarction.  There are advanced chronic appearing small vessel changes throughout the pons.  No focal cerebellar insult.  The cerebral hemispheres show moderate chronic appearing small vessel changes affecting the deep and subcortical white matter.  No cortical or large vessel territory infarction.  No mass lesion, hemorrhage, hydrocephalus or extra-axial collection.  The pituitary gland is normal.  Sinuses, middle ears and mastoids are clear. Major vessels are patent at the base of the brain.  IMPRESSION: Advanced chronic appearing small vessel changes affecting the pons and the cerebral hemispheric white matter.  No identifiably acute or subacute insult.  MRI CERVICAL SPINE  Findings: Alignment is normal.  There are minor non compressive disc bulges from C3-4 through C6-7.  There is no disc herniation or any compressive narrowing of the canal or foramina.  No articular pathology.  No focal osseous lesion.  No abnormal cord signal.  IMPRESSION: No significant lesion in the cervical spine.  Mild non compressive disc bulges.   Original Report Authenticated By: Paulina Fusi, M.D.    Mr Cervical Spine Wo Contrast  02/12/2012  *RADIOLOGY REPORT*  Clinical Data:  Right lower extremity weakness  MRI HEAD WITHOUT CONTRAST MRI CERVICAL SPINE WITHOUT CONTRAST  Technique:  Multiplanar, multiecho pulse sequences of the brain and surrounding structures, and cervical spine, to include the craniocervical junction and cervicothoracic junction, were obtained without intravenous contrast.  Comparison:  Head CT 08/19/2007  MRI HEAD  Findings:  Diffusion imaging does not show any acute or subacute infarction.  There are advanced chronic appearing small vessel changes throughout the pons.  No focal cerebellar insult.  The cerebral hemispheres show moderate chronic appearing small vessel changes affecting the deep and subcortical  white matter.  No cortical or large vessel territory infarction.  No mass lesion, hemorrhage, hydrocephalus or extra-axial collection.  The pituitary gland is normal.  Sinuses, middle ears and mastoids are clear. Major vessels are patent at the base of the brain.  IMPRESSION: Advanced chronic appearing small vessel changes affecting the pons and the cerebral hemispheric white matter.  No identifiably acute or subacute insult.  MRI CERVICAL SPINE  Findings: Alignment is normal.  There are minor non compressive disc bulges from C3-4 through C6-7.  There is no disc herniation or any compressive narrowing of the canal or foramina.  No articular pathology.  No focal osseous lesion.  No abnormal cord signal.  IMPRESSION: No significant lesion in the cervical spine.  Mild non compressive disc bulges.   Original Report Authenticated By: Paulina Fusi, M.D.    Mr Lumbar Spine W Contrast  02/10/2012  *RADIOLOGY REPORT*  Clinical Data: Right leg weakness.  MRI LUMBAR SPINE WITH CONTRAST  Technique:  Multiplanar and multiecho pulse sequences of the lumbar spine were obtained with intravenous contrast.  Contrast: 13mL MULTIHANCE GADOBENATE DIMEGLUMINE 529 MG/ML IV SOLN  Comparison: Unenhanced lumbar MRI from yesterday  Findings: Image quality degraded by motion.  Normal lumbar alignment.  Negative for fracture or mass.  No enhancing lesion is seen.  No evidence of epidural abscess.  No bone marrow edema is present.  Interval decompression of the urinary bladder and right renal collecting system.  IMPRESSION: No acute abnormality.  No   fracture or mass.  Negative for epidural abscess.   Original Report Authenticated By: Janeece Riggers, M.D.    Mr Thoracic Spine W Wo Contrast  02/12/2012  *RADIOLOGY REPORT*  Clinical Data: 72 year old female with severe right lower extremity weakness, difficulty using the right lower extremity, but is able to walk with walker.  Recently unable to void.  Recent episode of back pain radiating to the  right buttock.  MRI THORACIC SPINE WITHOUT AND WITH CONTRAST  Technique:  Multiplanar and multiecho pulse sequences of the thoracic spine were obtained without and with intravenous contrast.  Contrast:  15 ml MultiHance.  Comparison: Lumbar MRI without contrast 02/09/2012.  Chest CTs 04/21/2010 and earlier.  Brain MRI 02/11/2012.  Findings: Patchy increased STIR and T2 signal within the lower thoracic spinal cord, with epicenter at the lower T11 vertebral level, and extension across about 1.5 vertebral bodies.  The abnormality is primarily in the right hemi cord, perhaps with only minimal extension into or across the midline along its lower extent.  Following contrast, there is patchy and nodular enhancement of the affected area, but there is no expansion of the spinal cord.  The spinal cord levels above and below the lesion are normal.  The conus medullaris appears be normal and is mostly visualized, terminates below L1.  Normal thoracic vertebral height, alignment, and marrow signal.   Visualized paraspinal soft tissues are within normal limits. Capacious thoracic spinal canal.  No spinal stenosis.  No significant degenerative changes.  Negative visualized thoracic viscera including the signal associated with the visualized thoracic and upper abdominal aorta.  There are multiple small circumscribed T2 hyperintense areas in the liver which do not appear to enhance, compatible with biliary cysts or hamartomas.  IMPRESSION: Non-expansile signal abnormality in the right hemi cord at T11-T12. Associated patchy enhancement.  Conus medullaris appears to be spared, and no other areas of abnormal thoracic spinal cord signal identified. Case presentation and imaging findings discussed at length with Dr. Thana Farr. While some aspects of the presentation could indicate a cord infarct, the unilateral involvement makes that seem unlikely. At this point I favor an inflammatory process of the cord such as neurosarcoidosis,  similar inflammatory myelitis, or demyelination (ADEM, etc). The patient appears to have a remote history of "adenocarcinoma NOS" but with absent mass effect, metastatic disease and/or tumor is unlikely.   Original Report Authenticated By: Erskine Speed, M.D.    Mr Pelvis W Wo Contrast  02/11/2012  *RADIOLOGY REPORT*  Clinical Data: Right leg weakness acutely, urinary bladder distention on prior exam  MRI PELVIS WITHOUT AND WITH CONTRAST  Technique:  Multiplanar multisequence MR imaging of the pelvis was performed both before and after administration of intravenous contrast.  Contrast: 13mL MULTIHANCE GADOBENATE DIMEGLUMINE 529 MG/ML IV SOLN  Comparison: [facility] lumbar spine MRI 02/09/2012  Findings: Bladder is partially decompressed with a Foley catheter in place.  Evidence of prior hysterectomy and oophorectomy bilaterally.  No pelvic free fluid or lymphadenopathy. Osseous structures are within normal limits.  Postcontrast images are mildly degraded by motion but no abnormal enhancing mass is identified.  Moderate amount of stool within nondilated rectum.  A few colonic diverticuli are noted without surrounding edema  or colonic wall thickening to suggest diverticulitis.  IMPRESSION: No enhancing mass or other abnormality to explain the previous finding of urinary bladder distention or right lower extremity weakness.   Original Report Authenticated By: Christiana Pellant, M.D.     Medications:  I have reviewed the patient's current medications. Scheduled:   . aspirin  81 mg Oral Daily  . enoxaparin (LOVENOX) injection  40 mg Subcutaneous Q24H  . pantoprazole  40 mg Oral Daily  . sodium chloride  3 mL Intravenous Q12H  . triamterene-hydrochlorothiazide  1 each Oral Q M,W,F  . verapamil  80 mg Oral TID PC & HS    Assessment/Plan:  Patient Active Hospital Problem List: RLE weakness/urinary retention (02/10/2012)   Assessment: Etiology for presentation remains unclear. Exam  remains confusing as well.Imaging unrematrkable.     Plan: 1. MRI of the thoracic spine  2. Need for LP discussed with patient.  She is refusing at this time.    3.  Continued therapy    LOS: 2 days   Thana Farr, MD [facility] [phone] 02/12/2012  5:59 PM

## 2012-02-12 NOTE — Progress Notes (Signed)
Physical Therapy Treatment Patient Details Name: Amy Black MRN: [id number] DOB: 13-Jan-1941 Today's Date: 02/12/2012 Time: [id number] Amy Black Time Calculation (min): 26 min  Amy Black Assessment / Plan / Recommendation Comments on Treatment Session  Adm with RLE flaccidity.  Making steady progress.    Follow Up Recommendations  CIR     Does the patient have the potential to tolerate intense rehabilitation     Barriers to Discharge        Equipment Recommendations  Rolling walker with 5" wheels;Wheelchair (measurements Amy Black)    Recommendations for Other Services    Frequency Min 3X/week   Plan Discharge plan remains appropriate;Frequency remains appropriate    Precautions / Restrictions Precautions Precautions: Fall   Pertinent Vitals/Pain N/A    Mobility  Bed Mobility Left Sidelying to Sit: 4: Min assist;With rails;HOB elevated Sitting - Scoot to Edge of Bed: 4: Min assist;With rail Details for Bed Mobility Assistance: Assist to move rt leg. Transfers Sit to Stand: 4: Min assist;With upper extremity assist;From bed;From chair/3-in-1;With armrests Stand to Sit: 4: Min assist;With upper extremity assist;With armrests;To chair/3-in-1 Ambulation/Gait Ambulation/Gait Assistance: 3: Mod assist Ambulation Distance (Feet): 45 Feet (45' x 1, 25' x 1) Assistive device: Rolling walker Ambulation/Gait Assistance Details: Assist to advance rt leg and to stabilize knee to prevent buckling.  Constant verbal/tactile cues for gait sequence. Gait Pattern: Step-to pattern;Decreased step length - right;Right flexed knee in stance    Exercises General Exercises - Lower Extremity Long Arc Quad: AAROM;Right;10 reps;Seated Hip Flexion/Marching: AAROM;Right;10 reps;Seated   Amy Black Diagnosis:    Amy Black Problem List:   Amy Black Treatment Interventions:     Amy Black Goals Acute Rehab Amy Black Goals Amy Black Goal: Supine/Side to Sit - Progress: Progressing toward goal Amy Black Goal: Sit to Stand - Progress: Progressing toward goal Amy Black  Goal: Stand to Sit - Progress: Progressing toward goal Amy Black Transfer Goal: Bed to Chair/Chair to Bed - Progress: Progressing toward goal Amy Black Goal: Ambulate - Progress: Progressing toward goal  Visit Information  Last Amy Black Received On: 02/12/12 Assistance Needed: +1    Subjective Data  Subjective: Amy Black stating she is ready for therapy.   Cognition  Overall Cognitive Status: Appears within functional limits for tasks assessed/performed Arousal/Alertness: Awake/alert Behavior During Session: [facility] for tasks performed    Balance  Static Standing Balance Static Standing - Balance Support: Bilateral upper extremity supported (on walker) Static Standing - Level of Assistance: 4: Min assist  End of Session Amy Black - End of Session Equipment Utilized During Treatment: Gait belt Activity Tolerance: Patient tolerated treatment well Patient left: in chair;with call bell/phone within reach Nurse Communication: Mobility status   GP     Madgie Dhaliwal 02/12/2012, 12:30 PM  [employer] Amy Black [phone]

## 2012-02-12 NOTE — Consult Note (Signed)
Physical Medicine and Rehabilitation Consult Reason for Consult: Lower extremity weakness Referring Physician: [facility]   HPI: Amy Black is a 72 y.o. right-handed female with history of coronary artery disease, hypertension. Admitted 02/10/2012 with right lower extremity weakness. Patient reported that the symptoms started on Monday, 02/05/2012 while she was a passenger in a car while her daughter was driving. She noted a sudden shooting pain down her right hip that lasted only a short time and she could not move her right lower extremity. Despite the weakness of right lower extremity she was able to walk but stumbled when doing so. Patient also complained of urinary retention over the past 2-3 months. MRI of lumbar spine showed no acute abnormalities nor fracture or mass. MRI of the pelvis also unremarkable. She did require urinary catheterization in the emergency department and drained for about 1200 cc. Neurology services consulted with workup ongoing. MRI of the brain as well as cervical spine pending. Plans for lumbar puncture. Maintained on subcutaneous Lovenox for DVT prophylaxis. Physical therapy evaluation completed and ongoing recommendations made for physical medicine rehabilitation consult to consider inpatient rehabilitation services   Review of Systems  Genitourinary:       Urinary retention  Musculoskeletal: Positive for myalgias.  Neurological: Positive for tingling and weakness.  Psychiatric/Behavioral: Negative for depression, suicidal ideas and substance abuse.  All other systems reviewed and are negative.   Past Medical History  Diagnosis Date  . CAD (coronary artery disease)   . Irritable bowel syndrome   . Diverticulosis of colon (without mention of hemorrhage)   . hepatic cyst   . Hyperlipidemia   . 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 HTN (hypertension)     PT. DENIES  . History of cardiac catheterization   . Unstable angina   . Adenocarcinoma     LEFT LEG    . Insomnia   . Vitamin D deficiency   . Adenocarcinoma of unknown primary 02/06/2011   Past Surgical History  Procedure Date  . Resection of leg lesion and radiation 2004  . Diagnostic laparoscopy   . Extensive lysis of adhesions   . ;eft salpingo-oophorectomy   . Abdominal hysterectomy   . Pelvic laparoscopy 1992    RSO, AND LSO ON 2007   Family History  Problem Relation Age of Onset  . Diabetes      Multiple family members on both sides   . Coronary artery disease      Multiple family members on both sides   . Cancer Mother     Bladder  . Heart disease Mother   . Hypertension Mother   . Colon cancer Neg Hx   . Heart disease Father    Social History:  reports that she quit smoking about 39 years ago. Her smoking use included Cigarettes. She has never used smokeless tobacco. She reports that she does not drink alcohol or use illicit drugs. Allergies:  Allergies  Allergen Reactions  . Iodine    Medications Prior to Admission  Medication Sig Dispense Refill  . ALPRAZolam (XANAX) 0.5 MG tablet Take 0.5 mg by mouth 2 (two) times daily as needed.        Marland Kitchen aspirin 81 MG tablet Take 81 mg by mouth daily.        Marland Kitchen NITROSTAT 0.4 MG SL tablet Place 0.4 mg under the tongue every 5 (five) minutes as needed.       . pantoprazole (PROTONIX) 40 MG tablet Take 40 mg by mouth daily.        Marland Kitchen  triamterene-hydrochlorothiazide (DYAZIDE) 37.5-25 MG per capsule Take 1 capsule by mouth every Monday, Wednesday, and Friday.       . verapamil (CALAN) 80 MG tablet Take 80 mg by mouth 4 (four) times daily - after meals and at bedtime.         Home: Home Living Lives With: Spouse;Daughter Available Help at Discharge: Family;Available PRN/intermittently Type of Home: House Home Access: Stairs to enter [employer] of Steps: 8 Entrance Stairs-Rails: Right Home Layout: Two level;Able to live on main level with bedroom/bathroom Bathroom Shower/Tub: Tub/shower unit;Curtain Bathroom  Toilet: Standard Home Adaptive Equipment: None Additional Comments: pt's husband works first shift and daughter works second so she has someone with her nearly all the time  Functional History: Prior Function Able to Take Stairs?: Yes Vocation: Retired Functional Status:  Mobility: Bed Mobility Bed Mobility: Rolling Left;Left Sidelying to Sit;Sitting - Scoot to Edge of Bed Rolling Left: 4: Min assist Left Sidelying to Sit: 4: Min assist;With rails Sitting - Scoot to [employer] of Bed: 4: Min assist;With rail Transfers Transfers: Sit to Stand;Stand to [employer] Transfers Sit to Stand: 4: Min assist;From bed;With upper extremity assist Stand to Sit: 4: Min assist;To bed;To chair/3-in-1;With upper extremity assist Stand Pivot Transfers: 4: Min assist Ambulation/Gait Ambulation/Gait Assistance: 3: Mod assist (with grandson pushing chair behind) Ambulation Distance (Feet): 22 Feet Assistive device: Rolling walker Ambulation/Gait Assistance Details: Step-by-step cues for gait sequence; tactile cues to activate R quad for greater stnce stability; required hands-on R knee for stability in stance, and pt buckle about 50-60% of the time; Pt seemed quite pleased with her progress; Required physical assist to advance RLE Gait Pattern: Step-to pattern;Decreased step length - right;Decreased stance time - right Stairs: No Wheelchair Mobility Wheelchair Mobility: No  ADL:    Cognition: Cognition Arousal/Alertness: Awake/alert Orientation Level: Oriented X4 Cognition Overall Cognitive Status: Appears within functional limits for tasks assessed/performed Arousal/Alertness: Awake/alert Orientation Level: Oriented X4 / Intact Behavior During Session: WFL for tasks performed Cognition - Other Comments: pt with decreased insight into limitations but seems mostly due to her being in bed past several days and not trying to functionally use the right leg  Blood pressure 125/75, pulse 82,  temperature 97.9 F (36.6 C), temperature source Oral, resp. rate 20, height 5\' 3"  (1.6 m), weight 63 kg (138 lb 14.2 oz), SpO2 99.00%. Physical Exam  Vitals reviewed. Constitutional: She is oriented to person, place, and time. She appears well-developed.  HENT:  Head: Normocephalic.  Eyes:       Pupils round and reactive to light  Neck: Normal range of motion. Neck supple. No thyromegaly present.  Cardiovascular: Normal rate and regular rhythm.   Pulmonary/Chest: Effort normal and breath sounds normal. She has no wheezes.  Abdominal: Soft. Bowel sounds are normal. She exhibits no distension. There is no tenderness.  Neurological: She is alert and oriented to person, place, and time.       Follows full commands. ?mild right facial weakness. BUE is 4+/5. R HF 2-/5. R KE 2-/5. 0/5 ADF and APF. Dtr's trace.   Skin: Skin is warm and dry.  Psychiatric: She has a normal mood and affect. Her behavior is normal. Judgment and thought content normal.    No results found for this or any previous visit (from the past 24 hour(s)). Mr Lumbar Spine W Contrast  02/10/2012  *RADIOLOGY REPORT*  Clinical Data: Right leg weakness.  MRI LUMBAR SPINE WITH CONTRAST  Technique:  Multiplanar and multiecho pulse sequences of the  lumbar spine were obtained with intravenous contrast.  Contrast: 13mL MULTIHANCE GADOBENATE DIMEGLUMINE 529 MG/ML IV SOLN  Comparison: Unenhanced lumbar MRI from yesterday  Findings: Image quality degraded by motion.  Normal lumbar alignment.  Negative for fracture or mass.  No enhancing lesion is seen.  No evidence of epidural abscess.  No bone marrow edema is present.  Interval decompression of the urinary bladder and right renal collecting system.  IMPRESSION: No acute abnormality.  No   fracture or mass.  Negative for epidural abscess.   Original Report Authenticated By: Janeece Riggers, M.D.    Mr Pelvis W Wo Contrast  02/11/2012  *RADIOLOGY REPORT*  Clinical Data: Right leg weakness acutely,  urinary bladder distention on prior exam  MRI PELVIS WITHOUT AND WITH CONTRAST  Technique:  Multiplanar multisequence MR imaging of the pelvis was performed both before and after administration of intravenous contrast.  Contrast: 13mL MULTIHANCE GADOBENATE DIMEGLUMINE 529 MG/ML IV SOLN  Comparison: [facility] lumbar spine MRI 02/09/2012  Findings: Bladder is partially decompressed with a Foley catheter in place.  Evidence of prior hysterectomy and oophorectomy bilaterally.  No pelvic free fluid or lymphadenopathy. Osseous structures are within normal limits.  Postcontrast images are mildly degraded by motion but no abnormal enhancing mass is identified.  Moderate amount of stool within nondilated rectum.  A few colonic diverticuli are noted without surrounding edema or colonic wall thickening to suggest diverticulitis.  IMPRESSION: No enhancing mass or other abnormality to explain the previous finding of urinary bladder distention or right lower extremity weakness.   Original Report Authenticated By: Christiana Pellant, M.D.     Assessment/Plan: Diagnosis: RLE of unclear origin (?small subcortical infarct) 1. Does the need for close, 24 hr/day medical supervision in concert with the patient's rehab needs make it unreasonable for this patient to be served in a less intensive setting? Yes 2. Co-Morbidities requiring supervision/potential complications: CAD, GERD,  3. Due to bladder management, bowel management, safety, skin/wound care, disease management, medication administration, pain management and patient education, does the patient require 24 hr/day rehab nursing? Potentially 4. Does the patient require coordinated care of a physician, rehab nurse, PT (1-2 hrs/day, 5 days/week) and OT (1-2 hrs/day, 5 days/week) to address physical and functional deficits in the context of the above medical diagnosis(es)? Yes Addressing deficits in the following areas: balance, endurance, locomotion,  strength, transferring, bowel/bladder control, bathing, dressing, feeding, grooming, toileting and psychosocial support 5. Can the patient actively participate in an intensive therapy program of at least 3 hrs of therapy per day at least 5 days per week? Yes and Potentially 6. The potential for patient to make measurable gains while on inpatient rehab is to be determined 7. Anticipated functional outcomes upon discharge from inpatient rehab are ?min assist with PT, ?min assist with OT, n/a with SLP. 8. Estimated rehab length of stay to reach the above functional goals is: TBD 9. Does the patient have adequate social supports to accommodate these discharge functional goals? Yes 10. Anticipated D/C setting: Home 11. Anticipated post D/C treatments: HH therapy 12. Overall Rehab/Functional Prognosis: excellent  RECOMMENDATIONS: This patient's condition is appropriate for continued rehabilitative care in the following setting: CIR Patient has agreed to participate in recommended program. Yes Note that insurance prior authorization may be required for reimbursement for recommended care.  Comment: Will follow along for work up. She seems quite sincere and motivated to improve!  Ivory Broad, MD    02/12/2012

## 2012-02-12 NOTE — Progress Notes (Signed)
Rehab Admissions Coordinator Note:  Patient was screened by Trish Mage for appropriateness for an Inpatient Acute Rehab Consult.  At this time, I am deferring the prescreen.  An inpatient rehab consult has been ordered and is pending completion.  Trish Mage 02/12/2012, 8:56 AM  I can be reached at [phone].

## 2012-02-12 NOTE — Progress Notes (Signed)
[facility] PROGRESS NOTE  Madilyne Tadlock Votta ZOX:[id number] DOB: [birth year] DOA: 02/10/2012 PCP: Rudi Heap, MD  Brief narrative: 72 year old female with past medical history of CAD who presented with sudden onset right lower extremity weakness/paralysis. The first time she has experienced the symptoms was 6 days prior to this admission while she was in a car. She saw orthopedic surgeon the following day and they have ordered MRI lumbar spine which did not reveal acute findings. She was told to come to ED due to distended bladder identified on MRI. Further workup included MRI of pelvis, cervical spine and brain and is essentially not revealing. Neurology is assistant and management. Per physical therapy evaluation recommendation is for inpatient rehabilitation. Evaluation for inpatient rehabilitation is pending.  Assessment/Plan:   Principal problem *Flaccid paralysis of legs  Per neurology further orders placed for MRI cervical spine and brain. Essentially all MRI studies are nonrevealing  Patient continues to have right lower extremity paralysis Inpatient rehabilitation evaluation pending  Active Problems:  Urinary retention  Good urine output  Continue foley catheter Hypertension  Blood pressure 111/75   Continue  Triamterene-- HCTZ and verapamil  Code Status: full code  Family Communication: family not at bedside  Disposition Plan: CIR pending   Manson Passey, MD  [facility] Pager [phone]  If 7PM-7AM, please contact night-coverage [email or web address] Password [email or web address] 02/12/2012, 1:54 PM   LOS: 2 days   Consultants:  Neurology  Procedures:  None   Antibiotics:  None   HPI/Subjective: No acute events overnight.  Objective: Filed Vitals:   02/11/12 1555 02/11/12 2231 02/12/12 0614 02/12/12 0848  BP: 113/72 120/74 125/75 111/75  Pulse: 86 85 82   Temp: 97.8 F (36.6 C) 98.6 F (37 C) 97.9 F (36.6 C)   TempSrc: Oral     Resp:  20 20   Height:      Weight:        SpO2: 99% 99% 99%     Intake/Output Summary (Last 24 hours) at 02/12/12 1354 Last data filed at 02/12/12 0908  Gross per 24 hour  Intake    280 ml  Output   1500 ml  Net  -1220 ml    Exam:   General:  Pt is alert, follows commands appropriately, not in acute distress  Cardiovascular: Regular rate and rhythm, S1/S2, no murmurs, no rubs, no gallops  Respiratory: Clear to auscultation bilaterally, no wheezing, no crackles, no rhonchi  Abdomen: Soft, non tender, non distended, bowel sounds present, no guarding  Extremities: No edema, pulses DP and PT palpable bilaterally  Neuro: Right lower extremity no muscle strength, left lower extremity 5 out of 5 muscle strength; sensation intact throughout  Data Reviewed: Basic Metabolic Panel:  Lab 02/10/12 8295 02/10/12 0051  NA 139 134*  K 4.3 4.7  CL 102 99  CO2 28 24  GLUCOSE 103* 98  BUN 20 20  CREATININE 0.88 0.91  CALCIUM 9.3 9.8  MG -- --  PHOS -- --   CBC:  Lab 02/10/12 0827 02/10/12 0051  WBC 11.5* 11.4*  NEUTROABS -- --  HGB 11.9* 12.6  HCT 37.1 37.7  MCV 93.7 93.5  PLT 421* 478*    Recent Results (from the past 240 hour(s))  URINE CULTURE     Status: Normal   Collection Time   02/10/12  1:47 AM      Component Value Range Status Comment   Specimen Description URINE, RANDOM   Final    Special Requests NONE  Final    Culture  Setup Time 02/10/2012 11:16   Final    Colony Count >=100,000 COLONIES/ML   Final    Culture     Final    Value: DIPHTHEROIDS(CORYNEBACTERIUM SPECIES)     Note: Standardized susceptibility testing for this organism is not available.   Report Status 02/11/2012 FINAL   Final      Studies: Mr Brain Wo Contrast  02/12/2012  *RADIOLOGY REPORT*  Clinical Data:  Right lower extremity weakness  MRI HEAD WITHOUT CONTRAST MRI CERVICAL SPINE WITHOUT CONTRAST  Technique:  Multiplanar, multiecho pulse sequences of the brain and surrounding structures, and cervical spine, to include the  craniocervical junction and cervicothoracic junction, were obtained without intravenous contrast.  Comparison:  Head CT 08/19/2007  MRI HEAD  Findings:  Diffusion imaging does not show any acute or subacute infarction.  There are advanced chronic appearing small vessel changes throughout the pons.  No focal cerebellar insult.  The cerebral hemispheres show moderate chronic appearing small vessel changes affecting the deep and subcortical white matter.  No cortical or large vessel territory infarction.  No mass lesion, hemorrhage, hydrocephalus or extra-axial collection.  The pituitary gland is normal.  Sinuses, middle ears and mastoids are clear. Major vessels are patent at the base of the brain.  IMPRESSION: Advanced chronic appearing small vessel changes affecting the pons and the cerebral hemispheric white matter.  No identifiably acute or subacute insult.  MRI CERVICAL SPINE  Findings: Alignment is normal.  There are minor non compressive disc bulges from C3-4 through C6-7.  There is no disc herniation or any compressive narrowing of the canal or foramina.  No articular pathology.  No focal osseous lesion.  No abnormal cord signal.  IMPRESSION: No significant lesion in the cervical spine.  Mild non compressive disc bulges.   Original Report Authenticated By: Paulina Fusi, M.D.    Mr Cervical Spine Wo Contrast  02/12/2012  *RADIOLOGY REPORT*  Clinical Data:  Right lower extremity weakness  MRI HEAD WITHOUT CONTRAST MRI CERVICAL SPINE WITHOUT CONTRAST  Technique:  Multiplanar, multiecho pulse sequences of the brain and surrounding structures, and cervical spine, to include the craniocervical junction and cervicothoracic junction, were obtained without intravenous contrast.  Comparison:  Head CT 08/19/2007  MRI HEAD  Findings:  Diffusion imaging does not show any acute or subacute infarction.  There are advanced chronic appearing small vessel changes throughout the pons.  No focal cerebellar insult.  The cerebral  hemispheres show moderate chronic appearing small vessel changes affecting the deep and subcortical white matter.  No cortical or large vessel territory infarction.  No mass lesion, hemorrhage, hydrocephalus or extra-axial collection.  The pituitary gland is normal.  Sinuses, middle ears and mastoids are clear. Major vessels are patent at the base of the brain.  IMPRESSION: Advanced chronic appearing small vessel changes affecting the pons and the cerebral hemispheric white matter.  No identifiably acute or subacute insult.  MRI CERVICAL SPINE  Findings: Alignment is normal.  There are minor non compressive disc bulges from C3-4 through C6-7.  There is no disc herniation or any compressive narrowing of the canal or foramina.  No articular pathology.  No focal osseous lesion.  No abnormal cord signal.  IMPRESSION: No significant lesion in the cervical spine.  Mild non compressive disc bulges.   Original Report Authenticated By: Paulina Fusi, M.D.    Mr Lumbar Spine W Contrast  02/10/2012  *RADIOLOGY REPORT*  Clinical Data: Right leg weakness.  MRI LUMBAR SPINE  WITH CONTRAST  Technique:  Multiplanar and multiecho pulse sequences of the lumbar spine were obtained with intravenous contrast.  Contrast: 13mL MULTIHANCE GADOBENATE DIMEGLUMINE 529 MG/ML IV SOLN  Comparison: Unenhanced lumbar MRI from yesterday  Findings: Image quality degraded by motion.  Normal lumbar alignment.  Negative for fracture or mass.  No enhancing lesion is seen.  No evidence of epidural abscess.  No bone marrow edema is present.  Interval decompression of the urinary bladder and right renal collecting system.  IMPRESSION: No acute abnormality.  No   fracture or mass.  Negative for epidural abscess.   Original Report Authenticated By: Janeece Riggers, M.D.    Mr Pelvis W Wo Contrast  02/11/2012  *RADIOLOGY REPORT*  Clinical Data: Right leg weakness acutely, urinary bladder distention on prior exam  MRI PELVIS WITHOUT AND WITH CONTRAST  Technique:   Multiplanar multisequence MR imaging of the pelvis was performed both before and after administration of intravenous contrast.  Contrast: 13mL MULTIHANCE GADOBENATE DIMEGLUMINE 529 MG/ML IV SOLN  Comparison: [facility] lumbar spine MRI 02/09/2012  Findings: Bladder is partially decompressed with a Foley catheter in place.  Evidence of prior hysterectomy and oophorectomy bilaterally.  No pelvic free fluid or lymphadenopathy. Osseous structures are within normal limits.  Postcontrast images are mildly degraded by motion but no abnormal enhancing mass is identified.  Moderate amount of stool within nondilated rectum.  A few colonic diverticuli are noted without surrounding edema or colonic wall thickening to suggest diverticulitis.  IMPRESSION: No enhancing mass or other abnormality to explain the previous finding of urinary bladder distention or right lower extremity weakness.   Original Report Authenticated By: Christiana Pellant, M.D.     Scheduled Meds:   . aspirin  81 mg Oral Daily  . enoxaparin (LOVENOX)   40 mg Subcutaneous Q24H  . pantoprazole  40 mg Oral Daily  . triamterene-hydrochlorot  1 each Oral Q M,W,F  . verapamil  80 mg Oral TID PC & HS

## 2012-02-12 NOTE — Clinical Documentation Improvement (Signed)
Abnormal Labs Clarification  THIS DOCUMENT IS NOT A PERMANENT PART OF THE MEDICAL RECORD  TO RESPOND TO THE THIS QUERY, FOLLOW THE INSTRUCTIONS BELOW:  1. If needed, update documentation for the patient's encounter via the notes activity.  2. Access this query again and click edit on the [employer].  3. After updating, or not, click F2 to complete all highlighted (required) fields concerning your review. Select "additional documentation in the medical record" OR "no additional documentation provided".  4. Click Sign note button.  5. The deficiency will fall out of your InBasket *Please let us know if you are not able to complete this workflow by phone or e-mail (listed below).  Please update your documentation within the medical record to reflect your response to this query.                                                                                   02/12/12  Dear Dr. Ashok Pall [facility]  In a better effort to capture your patient's severity of illness, reflect appropriate length of stay and utilization of resources, a review of the medical record has revealed the following indicators.    Based on your clinical judgment, please clarify and document in a progress note and/or discharge summary the clinical condition associated with the following supporting information:  In responding to this query please exercise your independent judgment.  The fact that a query is asked, does not imply that any particular answer is desired or expected.  Abnormal findings (laboratory, x-ray, pathologic, and other diagnostic results) are not coded and reported unless the physician indicates their clinical significance.   The medical record reflects the following clinical findings, please clarify the diagnostic and/or clinical significance:       Noted patient's urine culture grew >=100,000 COLONIES/ML  Culture DIPHTHEROIDS(CORYNEBACTERIUM SPECIES , please document appropriate  secondary diagnosis pertaining to lab value. Thank you     Possible Clinical Conditions?                                   UTI  Bacteruria  Abnormal lab value  Other Condition                Cannot Clinically Determine      Reviewed: I would not recommend putting uti as a working diagnosis as the patient did not have true signs of UTI, no fever and no complaints of urgency, dysuria or similar. This may have been a colonization even with this high colony count. When my note was written this result was not available so I would not change my note to reflect this and I dont think that the patient was treated with antibiotics so would not list UTI as a problem diagnosis   Thank You,  Leonette Most Addison  Clinical Documentation Specialist.,BSN: Pager [phone]/ [phone]  [facility] [place]

## 2012-02-12 NOTE — Evaluation (Signed)
Occupational Therapy Evaluation Patient Details Name: Amy Black MRN: [id number] DOB: Sep 26, 1940 Today's Date: 02/12/2012 Time: 4098-1191 OT Time Calculation (min): 25 min  OT Assessment / Plan / Recommendation Clinical Impression  Pleasant and motivated 71yr old female admitted for weakness in her right leg.  Pt needs min assist overall for simulated selfcare tasks with use of assistive device.  Feel she will benefit from acute care OT to address these deficits and increase functional performance of selfcare tasks to a  modified independent level.  Feel she will benefit from follow-up CIR level therapies to reach modified independent status.    OT Assessment  Patient needs continued OT Services    Follow Up Recommendations  CIR    Barriers to Discharge None    Equipment Recommendations  3 in 1 bedside comode;Tub/shower bench    Recommendations for Other Services Rehab consult  Frequency  Min 2X/week    Precautions / Restrictions Precautions Precautions: Fall Precaution Comments: RLE weakness Restrictions Weight Bearing Restrictions: No   Pertinent Vitals/Pain No pain, vitals stable    ADL  Eating/Feeding: Independent Where Assessed - Eating/Feeding: Chair Grooming: Simulated;Minimal assistance Where Assessed - Grooming: Unsupported standing Upper Body Bathing: Simulated;Set up Where Assessed - Upper Body Bathing: Unsupported sitting Lower Body Bathing: Simulated;Minimal assistance Where Assessed - Lower Body Bathing: Unsupported sit to stand Upper Body Dressing: Simulated;Set up Where Assessed - Upper Body Dressing: Unsupported sitting Lower Body Dressing: Simulated;Minimal assistance Where Assessed - Lower Body Dressing: Supported sit to stand Toilet Transfer: Performed;Minimal assistance Toilet Transfer Method: Other (comment) (ambulating with RW) Toilet Transfer Equipment: Comfort height toilet;Grab bars;Other (comment) (with use of RW) Toileting - Clothing  Manipulation and Hygiene: Simulated;Minimal assistance Where Assessed - Engineer, mining and Hygiene: Sit to stand from 3-in-1 or toilet Tub/Shower Transfer Method: Not assessed Equipment Used: Rolling walker Transfers/Ambulation Related to ADLs: Pt overall min assist for mobility to the bathroom with use of a RW.  Decreased ability to efficiently advance the RLE. ADL Comments: Pt overall min assist level for dynamic standing balance and sit to stand.  Decreased ability to donn her right sock secondary to not being able to lift her RLE in sitting.  Pt very motivated to participate in therapy.    OT Diagnosis: Generalized weakness  OT Problem List: Decreased strength;Impaired balance (sitting and/or standing);Decreased knowledge of use of DME or AE OT Treatment Interventions: Self-care/ADL training;Therapeutic activities;DME and/or AE instruction;Balance training;Patient/family education;Neuromuscular education;Therapeutic exercise   OT Goals Acute Rehab OT Goals OT Goal Formulation: With patient Time For Goal Achievement: 02/26/12 Potential to Achieve Goals: Good ADL Goals Pt Will Perform Grooming: with modified independence;Supported;Standing at sink ADL Goal: Grooming - Progress: Goal set today Pt Will Perform Upper Body Bathing: with modified independence;Supported;Sit to stand from bed ADL Goal: Upper Body Bathing - Progress: Goal set today Pt Will Perform Lower Body Bathing: with modified independence;Sit to stand from chair;Supported ADL Goal: Lower Body Bathing - Progress: Goal set today Pt Will Perform Upper Body Dressing: with modified independence;Supported;Sit to stand from bed ADL Goal: Upper Body Dressing - Progress: Goal set today Pt Will Perform Lower Body Dressing: with modified independence;Supported;Sit to stand from chair ADL Goal: Lower Body Dressing - Progress: Goal set today Pt Will Transfer to Toilet: with modified independence;with DME;3-in-1 ADL  Goal: Toilet Transfer - Progress: Goal set today  Visit Information  Last OT Received On: 02/12/12 Assistance Needed: +1    Subjective Data  Subjective: I'm ready to get on with my rehab.  Patient Stated Goal: Get back to moving her right leg again.   Prior Functioning     Home Living Lives With: Spouse;Daughter Available Help at Discharge: Family;Available PRN/intermittently Type of Home: House Home Access: Stairs to enter [employer] of Steps: 8 Entrance Stairs-Rails: Right Home Layout: Two level;Able to live on main level with bedroom/bathroom Bathroom Shower/Tub: Tub/shower unit;Curtain Firefighter: Standard Home Adaptive Equipment: None Additional Comments: pt's husband works first shift and daughter works second so she has someone with her nearly all the time Prior Function Level of Independence: Independent Able to Take Stairs?: Yes Vocation: Retired Musician: No difficulties Dominant Hand: Right         Vision/Perception Vision - Assessment Vision Assessment: Vision not tested Perception Perception: Within Functional Limits Praxis Praxis: Intact   Cognition  Overall Cognitive Status: Appears within functional limits for tasks assessed/performed Arousal/Alertness: Awake/alert Orientation Level: Appears intact for tasks assessed Behavior During Session: [facility] for tasks performed    Extremity/Trunk Assessment Right Upper Extremity Assessment RUE ROM/Strength/Tone: Within functional levels RUE Sensation: WFL - Light Touch RUE Coordination: WFL - gross/fine motor Left Upper Extremity Assessment LUE ROM/Strength/Tone: Within functional levels LUE Sensation: WFL - Light Touch LUE Coordination: WFL - gross/fine motor Trunk Assessment Trunk Assessment: Normal     Mobility Bed Mobility Left Sidelying to Sit: 4: Min assist;With rails;HOB elevated Sitting - Scoot to Edge of Bed: 4: Min assist;With rail Details for Bed  Mobility Assistance: Assist to move rt leg. Transfers Transfers: Sit to Stand Sit to Stand: With upper extremity assist;4: Min assist;From chair/3-in-1 Stand to Sit: 4: Min assist;Without upper extremity assist;To chair/3-in-1           Balance Static Sitting Balance Static Sitting - Balance Support: No upper extremity supported Static Sitting - Level of Assistance: 5: Stand by assistance Static Standing Balance Static Standing - Balance Support: No upper extremity supported Static Standing - Level of Assistance: 4: Min assist   End of Session OT - End of Session Equipment Utilized During Treatment: Gait belt Activity Tolerance: Patient tolerated treatment well Patient left: in chair;with call bell/phone within reach Nurse Communication: Mobility status     Eulonda Andalon OTR/L Pager number [phone] 02/12/2012, 2:51 PM

## 2012-02-12 NOTE — Progress Notes (Signed)
Pt wants to come to CIR.  Need to contact her insurance for coverage authorization.  Also, need to know when she will be medically ready [when acute testing/evaluation is completed].  Will f/up. [phone]

## 2012-02-13 ENCOUNTER — Inpatient Hospital Stay (HOSPITAL_COMMUNITY)
Admission: RE | Admit: 2012-02-13 | Discharge: 2012-02-16 | DRG: 945 | Disposition: A | Discharge status: Home / Self Care | Payer: Medicare Other | Source: Intra-hospital | Attending: Physical Medicine & Rehabilitation | Admitting: Physical Medicine & Rehabilitation

## 2012-02-13 DIAGNOSIS — G959 Disease of spinal cord, unspecified: Secondary | ICD-10-CM

## 2012-02-13 DIAGNOSIS — C801 Malignant (primary) neoplasm, unspecified: Secondary | ICD-10-CM

## 2012-02-13 DIAGNOSIS — Z79899 Other long term (current) drug therapy: Secondary | ICD-10-CM

## 2012-02-13 DIAGNOSIS — K589 Irritable bowel syndrome without diarrhea: Secondary | ICD-10-CM

## 2012-02-13 DIAGNOSIS — B9689 Other specified bacterial agents as the cause of diseases classified elsewhere: Secondary | ICD-10-CM

## 2012-02-13 DIAGNOSIS — I2510 Atherosclerotic heart disease of native coronary artery without angina pectoris: Secondary | ICD-10-CM

## 2012-02-13 DIAGNOSIS — I200 Unstable angina: Secondary | ICD-10-CM

## 2012-02-13 DIAGNOSIS — Z5189 Encounter for other specified aftercare: Principal | ICD-10-CM

## 2012-02-13 DIAGNOSIS — Z7982 Long term (current) use of aspirin: Secondary | ICD-10-CM

## 2012-02-13 DIAGNOSIS — R29898 Other symptoms and signs involving the musculoskeletal system: Secondary | ICD-10-CM

## 2012-02-13 DIAGNOSIS — I10 Essential (primary) hypertension: Secondary | ICD-10-CM

## 2012-02-13 DIAGNOSIS — N390 Urinary tract infection, site not specified: Secondary | ICD-10-CM

## 2012-02-13 DIAGNOSIS — Z8589 Personal history of malignant neoplasm of other organs and systems: Secondary | ICD-10-CM

## 2012-02-13 DIAGNOSIS — E785 Hyperlipidemia, unspecified: Secondary | ICD-10-CM

## 2012-02-13 DIAGNOSIS — M4714 Other spondylosis with myelopathy, thoracic region: Secondary | ICD-10-CM

## 2012-02-13 DIAGNOSIS — K219 Gastro-esophageal reflux disease without esophagitis: Secondary | ICD-10-CM

## 2012-02-13 DIAGNOSIS — R339 Retention of urine, unspecified: Secondary | ICD-10-CM

## 2012-02-13 LAB — CBC
HCT: 37.6 % (ref 36.0–46.0)
Hemoglobin: 12.5 g/dL (ref 12.0–15.0)
MCH: 31.1 pg (ref 26.0–34.0)
MCHC: 33.2 g/dL (ref 30.0–36.0)
MCV: 93.5 fL (ref 78.0–100.0)
Platelets: 434 10*3/uL — ABNORMAL HIGH (ref 150–400)
RBC: 4.02 MIL/uL (ref 3.87–5.11)
RDW: 13.8 % (ref 11.5–15.5)
WBC: 9.5 10*3/uL (ref 4.0–10.5)

## 2012-02-13 LAB — CREATININE, SERUM
Creatinine, Ser: 0.85 mg/dL (ref 0.50–1.10)
GFR calc Af Amer: 79 mL/min — ABNORMAL LOW (ref >90)
GFR calc non Af Amer: 68 mL/min — ABNORMAL LOW (ref >90)

## 2012-02-13 LAB — B. BURGDORFI ANTIBODIES: B burgdorferi Ab IgG+IgM: 0.56 {ISR}

## 2012-02-13 MED ORDER — ACETAMINOPHEN 325 MG PO TABS
325.0000 mg | ORAL_TABLET | ORAL | Status: DC | PRN
  Administered 2012-02-16: 325 mg via ORAL
  Filled 2012-02-13: qty 2

## 2012-02-13 MED ORDER — SIMETHICONE 80 MG PO CHEW
80.0000 mg | CHEWABLE_TABLET | Freq: Four times a day (QID) | ORAL | Status: DC | PRN
  Filled 2012-02-13: qty 1

## 2012-02-13 MED ORDER — TRIAMTERENE-HCTZ 37.5-25 MG PO TABS
1.0000 | ORAL_TABLET | ORAL | Status: DC
  Administered 2012-02-14 – 2012-02-16 (×2): 1 via ORAL
  Filled 2012-02-13 (×3): qty 1

## 2012-02-13 MED ORDER — ASPIRIN 81 MG PO CHEW
81.0000 mg | CHEWABLE_TABLET | Freq: Every day | ORAL | Status: DC
  Administered 2012-02-14 – 2012-02-16 (×3): 81 mg via ORAL
  Filled 2012-02-13 (×3): qty 1

## 2012-02-13 MED ORDER — VERAPAMIL HCL 80 MG PO TABS
80.0000 mg | ORAL_TABLET | Freq: Three times a day (TID) | ORAL | Status: DC
  Administered 2012-02-13 – 2012-02-16 (×10): 80 mg via ORAL
  Filled 2012-02-13 (×16): qty 1

## 2012-02-13 MED ORDER — NITROGLYCERIN 0.4 MG SL SUBL
0.4000 mg | SUBLINGUAL_TABLET | SUBLINGUAL | Status: DC | PRN
Start: 2012-02-13 — End: 2012-02-16

## 2012-02-13 MED ORDER — ONDANSETRON HCL 4 MG/2ML IJ SOLN: 4.0000 mg | Freq: Four times a day (QID) | INTRAMUSCULAR | Status: DC | PRN

## 2012-02-13 MED ORDER — ENOXAPARIN SODIUM 40 MG/0.4ML ~~LOC~~ SOLN
40.0000 mg | SUBCUTANEOUS | Status: DC
  Administered 2012-02-13 – 2012-02-15 (×2): 40 mg via SUBCUTANEOUS
  Filled 2012-02-13 (×4): qty 0.4

## 2012-02-13 MED ORDER — POLYETHYLENE GLYCOL 3350 17 G PO PACK
17.0000 g | PACK | Freq: Every day | ORAL | Status: DC | PRN
  Filled 2012-02-13: qty 1

## 2012-02-13 MED ORDER — PANTOPRAZOLE SODIUM 40 MG PO TBEC
40.0000 mg | DELAYED_RELEASE_TABLET | Freq: Every day | ORAL | Status: DC
  Administered 2012-02-14 – 2012-02-16 (×3): 40 mg via ORAL
  Filled 2012-02-13 (×3): qty 1

## 2012-02-13 MED ORDER — ONDANSETRON HCL 4 MG PO TABS: 4.0000 mg | ORAL_TABLET | Freq: Four times a day (QID) | ORAL | Status: DC | PRN

## 2012-02-13 MED ORDER — ALPRAZOLAM 0.5 MG PO TABS
0.5000 mg | ORAL_TABLET | Freq: Two times a day (BID) | ORAL | Status: DC | PRN
  Administered 2012-02-13 – 2012-02-15 (×4): 0.5 mg via ORAL
  Filled 2012-02-13 (×4): qty 1

## 2012-02-13 MED ORDER — ENOXAPARIN SODIUM 40 MG/0.4ML ~~LOC~~ SOLN: 40.0000 mg | SUBCUTANEOUS | Status: DC

## 2012-02-13 MED ORDER — SENNOSIDES-DOCUSATE SODIUM 8.6-50 MG PO TABS: 1.0000 | ORAL_TABLET | Freq: Every evening | ORAL | Status: DC | PRN

## 2012-02-13 MED ORDER — SORBITOL 70 % SOLN: 30.0000 mL | Freq: Every day | Status: DC | PRN

## 2012-02-13 NOTE — Progress Notes (Signed)
Insurance CM requested contact # for Dr. Thad Ranger so Insurance MD could discuss the pt w/ her d/t no diagnosis.  Insurance CM Diamantina Providence then called back w/ approval by [facility] for pt to come to CIR.  Insurance is aware of no diagnosis.   [phone]

## 2012-02-13 NOTE — PMR Pre-admission (Signed)
PMR Admission Coordinator Pre-Admission Assessment  Patient: Amy Black is an 72 y.o., female MRN: [id number] DOB: 1941-01-11 Height: 5\' 3"  (160 cm) Weight: 63 kg (138 lb 14.2 oz)              Insurance Information HMO: yes    PPO:       PCP:       IPA:       80/20:       OTHER:   PRIMARY: AARP Medicare      Policy#: [id number]      Subscriber: pt CM Name: Pennelope Bracken      Phone#: [phone]    Fax#:  Pre-Cert#: [id number]      Employer: disabled since 72 yo-CAD [per pt] Benefits:  Phone #: [phone]     Name: Tammy Eff. Date: 02-07-11     Deduct: 0      Out of Pocket Max: $4900.00      Life Max: 0 CIR: $295.00/day days 1-5      SNF: $25.00/day days 1-20;$152.00/day days 21-49; 0 days 50-100 Outpatient:      Co-Pay: $45.00/visit Home Health: 100%      Co-Pay: 0 DME: 80%     Co-Pay: 20% Providers: [facility] network    Emergency Contact Information Contact Information    Name Relation Home Work Mobile   Vanduzer,Stacey Daughter [phone]     Bryann, Gentz                              Husband [phone]           [phone]                            [phone]     Current Medical History  Patient Admitting Diagnosis: RLE weakness of unclear origin History of Present Illness: 72 y.o. right-handed female with history of coronary artery disease, hypertension. Admitted 02/10/2012 with right lower extremity weakness. Patient reported that the symptoms started on Monday, 02/05/2012 while she was a passenger in a car while her daughter was driving. She noted a sudden shooting pain down her right hip that lasted only a short time and she could not move her right lower extremity. Despite the weakness of right lower extremity she was able to walk but stumbled when doing so. Patient also complained of urinary retention over the past 2-3 months. MRI of lumbar spine showed no acute abnormalities nor fracture or mass. MRI of the pelvis also unremarkable. She  did require urinary catheterization in the emergency department and drained for about 1200 cc. Neurology services consulted with workup ongoing. MRI of the brain as well as cervical spine pending. Plans for lumbar puncture. Maintained on subcutaneous Lovenox for DVT prophylaxis. Physical therapy evaluation completed and ongoing recommendations made for physical medicine rehabilitation consult to consider inpatient rehabilitation services   Pt refuses LP.    Past Medical History  Past Medical History  Diagnosis Date  . CAD (coronary artery disease)   . Irritable bowel syndrome   . Diverticulosis of colon (without mention of hemorrhage)   . hepatic cyst   . Hyperlipidemia   . 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 HTN (hypertension)     PT. DENIES  . History of cardiac catheterization   .  Unstable angina   . Adenocarcinoma     LEFT LEG  . Insomnia   . Vitamin D deficiency   . Adenocarcinoma of unknown primary 02/06/2011    Family History  family history includes Cancer in her mother; Coronary artery disease in an unspecified family member; Diabetes in an unspecified family member; Heart disease in her father and mother; and Hypertension in her mother.  There is no history of Colon cancer.  Prior Rehab/Hospitalizations: no   Current Medications  Current facility-administered medications:acetaminophen (TYLENOL) suppository 650 mg, 650 mg, Rectal, Q6H PRN, Gery Pray, MD;  acetaminophen (TYLENOL) tablet 650 mg, 650 mg, Oral, Q6H PRN, Gery Pray, MD, 650 mg at 02/10/12 0515;  ALPRAZolam Prudy Feeler) tablet 0.5 mg, 0.5 mg, Oral, BID PRN, Gery Pray, MD, 0.5 mg at 02/13/12 1610;  aspirin chewable tablet 81 mg, 81 mg, Oral, Daily, Debby Crosley, MD, 81 mg at 02/13/12 0939 enoxaparin (LOVENOX) injection 40 mg, 40 mg, Subcutaneous, Q24H, Alison Murray, MD, 40 mg at 02/13/12 9604;  nitroGLYCERIN (NITROSTAT) SL tablet 0.4 mg, 0.4 mg, Sublingual, Q5 min PRN, Debby Crosley, MD;  ondansetron  (ZOFRAN) injection 4 mg, 4 mg, Intravenous, Q6H PRN, Debby Crosley, MD;  ondansetron (ZOFRAN) tablet 4 mg, 4 mg, Oral, Q6H PRN, Debby Crosley, MD pantoprazole (PROTONIX) EC tablet 40 mg, 40 mg, Oral, Daily, Debby Crosley, MD, 40 mg at 02/13/12 0937;  senna-docusate (Senokot-S) tablet 1 tablet, 1 tablet, Oral, QHS PRN, Gery Pray, MD;  simethicone (MYLICON) chewable tablet 80 mg, 80 mg, Oral, Q6H PRN, Jinger Neighbors, NP, 80 mg at 02/13/12 1356;  sodium chloride 0.9 % injection 3 mL, 3 mL, Intravenous, Q12H, Debby Crosley, MD, 3 mL at 02/12/12 2123 sodium chloride 0.9 % injection 3 mL, 3 mL, Intravenous, PRN, Gery Pray, MD;  triamterene-hydrochlorothiazide (MAXZIDE-25) 37.5-25 MG per tablet 1 each, 1 each, Oral, Q M,W,F, Gery Pray, MD, 1 each at 02/12/12 1009;  verapamil (CALAN) tablet 80 mg, 80 mg, Oral, TID PC & HS, Debby Crosley, MD, 80 mg at 02/13/12 1356  Patients Current Diet: Cardiac  Precautions / Restrictions Precautions Precautions: Fall Precaution Comments: RLE weakness Restrictions Weight Bearing Restrictions: No   Prior Activity Level  Independent Home Assistive Devices / Equipment Home Assistive Devices/Equipment: Eyeglasses Home Adaptive Equipment: None  Prior Functional Level Prior Function Level of Independence: Independent Able to Take Stairs?: Yes Vocation: Retired  Current Functional Level Cognition  Arousal/Alertness: Awake/alert Overall Cognitive Status: Appears within functional limits for tasks assessed/performed Orientation Level: Oriented X4 Cognition - Other Comments: pt with decreased insight into limitations but seems mostly due to her being in bed past several days and not trying to functionally use the right leg    Extremity Assessment (includes Sensation/Coordination)  RUE ROM/Strength/Tone: Within functional levels RUE ROM/Strength/Tone Deficits: pt with decreased use of the RUE on eval, grip strength equal to left but shoulder flexoin  limited, pt reports that it is because of IV on right side and she is afraid to use it/ move it. She states that it feels normal to her RUE Sensation: WFL - Light Touch RUE Coordination: WFL - gross/fine motor  RLE ROM/Strength/Tone: Deficits RLE ROM/Strength/Tone Deficits: no active mvmt noted at the ankle or foot, occasional jerking, uncontrolled. Knee ext 2-/5, knee flex 2-/5, hip flex 1/5, hip abd 0/5, hip add 2-/5. MMT yielded voluntary muscle activation but pt also experienced involuntary mvmt throughout the RLE  RLE Sensation: WFL - Light Touch    ADLs  Eating/Feeding: Independent Where Assessed -  Eating/Feeding: Chair Grooming: Simulated;Minimal assistance Where Assessed - Grooming: Unsupported standing Upper Body Bathing: Simulated;Set up Where Assessed - Upper Body Bathing: Unsupported sitting Lower Body Bathing: Simulated;Minimal assistance Where Assessed - Lower Body Bathing: Unsupported sit to stand Upper Body Dressing: Simulated;Set up Where Assessed - Upper Body Dressing: Unsupported sitting Lower Body Dressing: Simulated;Minimal assistance Where Assessed - Lower Body Dressing: Supported sit to stand Toilet Transfer: Performed;Minimal assistance Toilet Transfer Method: Other (comment) (ambulating with RW) Toilet Transfer Equipment: Comfort height toilet;Grab bars;Other (comment) (with use of RW) Toileting - Clothing Manipulation and Hygiene: Simulated;Minimal assistance Where Assessed - Engineer, mining and Hygiene: Sit to stand from 3-in-1 or toilet Tub/Shower Transfer Method: Not assessed Equipment Used: Rolling walker Transfers/Ambulation Related to ADLs: Pt overall min assist for mobility to the bathroom with use of a RW.  Decreased ability to efficiently advance the RLE. ADL Comments: Pt overall min assist level for dynamic standing balance and sit to stand.  Decreased ability to donn her right sock secondary to not being able to lift her RLE in sitting.   Pt very motivated to participate in therapy.    Mobility  Bed Mobility: Rolling Left;Left Sidelying to Sit;Sitting - Scoot to Edge of Bed Rolling Left: 4: Min assist Left Sidelying to Sit: 4: Min assist;With rails;HOB elevated Sitting - Scoot to [employer] of Bed: 4: Min assist;With rail    Transfers  Transfers: Sit to Stand;Stand to [employer] Transfers Sit to Stand: With upper extremity assist;4: Min assist;From chair/3-in-1 Stand to Sit: 4: Min assist;Without upper extremity assist;To chair/3-in-1 Stand Pivot Transfers: 4: Min assist    Ambulation / Gait / Stairs / Psychologist, prison and probation services  Ambulation/Gait Ambulation/Gait Assistance: 3: Mod assist Ambulation Distance (Feet): 45 Feet (45' x 1, 25' x 1) Assistive device: Rolling walker Ambulation/Gait Assistance Details: Assist to advance rt leg and to stabilize knee to prevent buckling.  Constant verbal/tactile cues for gait sequence. Gait Pattern: Step-to pattern;Decreased step length - right;Right flexed knee in stance Stairs: No Wheelchair Mobility Wheelchair Mobility: No    Posture / Balance Static Sitting Balance Static Sitting - Balance Support: No upper extremity supported Static Sitting - Level of Assistance: 5: Stand by assistance Static Standing Balance Static Standing - Balance Support: No upper extremity supported Static Standing - Level of Assistance: 4: Min assist    Special needs/care consideration BiPAP/CPAP no CPM no Continuous Drip IV no Dialysis no        Days no Life Vest no Oxygen no Special Bed no Trach Size no Wound Vac (area) no      Location no Skin dry                         Location  Bowel mgmt: LBM 02/12/12 lg formed Bladder mgmt: foley Diabetic mgmt no     Previous Home Environment Living Arrangements: Spouse/significant other;Children Lives With: Spouse;Daughter Available Help at Discharge: Family;Available  Type of Home: House Home Layout: Two level;Able to live on main level with  bedroom/bathroom Home Access: Stairs to enter Entrance Stairs-Rails: Right Entrance Stairs-Number of Steps: 2 STE on front w/ rails Bathroom Shower/Tub: Tub/shower unit;Curtain Firefighter: Standard Home Care Services: No Additional Comments: pt's husband works first shift and daughter works second so she has someone with her nearly all the time  Discharge Living Setting Plans for Discharge Living Setting: Patient's home  Do you have any problems obtaining your medications?: No  Social/Family/Support Systems Patient Roles: Spouse;Parent Contact Information: home  [phone] Anticipated Caregiver: family  Anticipated Caregiver's Contact Information: husband Juel Burrow cell# [phone]; daughter Misty Stanley cell# [phone] Ability/Limitations of Caregiver: S-Light Min Caregiver Availability: 24/7 Discharge Plan Discussed with Primary Caregiver: Yes Is Caregiver In Agreement with Plan?: Yes Does Caregiver/Family have Issues with Lodging/Transportation while Pt is in Rehab?: No    Goals/Additional Needs Patient/Family Goal for Rehab: Mod I-S Expected length of stay: 7-10 days Pt/Family Agrees to Admission and willing to participate: Yes Program Orientation Provided & Reviewed with Pt/Caregiver Including Roles  & Responsibilities: Yes   Decrease burden of Care through IP rehab admission: Bowel and bladder program and Patient/family education   Possible need for SNF placement upon discharge: no   Patient Condition: This patient's condition remains as documented in the Consult dated 02/12/12, in which the Rehabilitation Physician determined and documented that the patient's condition is appropriate for intensive rehabilitative care in an inpatient rehabilitation facility.  Preadmission Screen Completed By:  Brock Ra, 02/13/2012 3:27 PM ______________________________________________________________________   Discussed status with Dr. Riley Kill on 02/13/12 at 3:26 pm and received telephone  approval for admission today.  Admission Coordinator:  Brock Ra, time 3:26pm/Date 02/13/12

## 2012-02-13 NOTE — Progress Notes (Signed)
Subjective: Patient unchanged today.  Results of MRI of the thoracic cord discussed at length with radiology.  They were discussed at length with patient and family as well.  Patient continues to refuse an LP.  She has been given the option to go to rehab instead and have the MRI repeated in 1-2 months on an outpatient basis.  Both she and the family understand that the treatment course may need to be adjusted if she deteriorates.    Objective: Current vital signs: BP 102/68  Pulse 80  Temp 98.1 F (36.7 C) (Oral)  Resp 20  Ht 5\' 3"  (1.6 m)  Wt 63 kg (138 lb 14.2 oz)  BMI 24.60 kg/m2  SpO2 97% Vital signs in last 24 hours: Temp:  [97.8 F (36.6 C)-98.1 F (36.7 C)] 98.1 F (36.7 C) (01/07 0500) Pulse Rate:  [75-84] 80  (01/07 0932) Resp:  [18-20] 20  (01/07 1121) BP: (94-103)/(55-68) 102/68 mmHg (01/07 0932) SpO2:  [97 %-100 %] 97 % (01/07 1121)  Intake/Output from previous day: 01/06 0701 - 01/07 0700 In: 320 [P.O.:320] Out: 675 [Urine:675] Intake/Output this shift: Total I/O In: 120 [P.O.:120] Out: 750 [Urine:750] Nutritional status: Cardiac  Neurologic Exam: Mental Status:  Alert, oriented, thought content appropriate. Speech fluent without evidence of aphasia. Able to follow 3 step commands without difficulty.  Cranial Nerves:  II: Discs flat bilaterally; Visual fields grossly normal, pupils equal, round, reactive to light and accommodation  III,IV, VI: ptosis not present, extra-ocular motions intact bilaterally  V,VII: smile symmetric, facial light touch sensation normal bilaterally  VIII: hearing normal bilaterally  IX,X: gag reflex present  XI: bilateral shoulder shrug  XII: midline tongue extension  Motor:  Right : Upper extremity 5/5 Left: Upper extremity 5/5  Lower extremity Hip flexion 0/5, 1-2/5 knee flexion, 0/5 otherwise in the lower extremity. Lower extremity 5/5  Tone and bulk:normal tone throughout; no atrophy noted  Sensory: Decreased sensation in  the RLE below the knee  Deep Tendon Reflexes: 2+ in the upper extremities, 1+ at the knees and absent at the ankles  Plantars:  Right: equivocal Left: equivocal  Cerebellar:  normal finger-to-nose    Lab Results: Basic Metabolic Panel:  Lab 02/10/12 1610 02/10/12 0051  NA 139 134*  K 4.3 4.7  CL 102 99  CO2 28 24  GLUCOSE 103* 98  BUN 20 20  CREATININE 0.88 0.91  CALCIUM 9.3 9.8  MG -- --  PHOS -- --    Liver Function Tests: No results found for this basename: AST:5,ALT:5,ALKPHOS:5,BILITOT:5,PROT:5,ALBUMIN:5 in the last 168 hours No results found for this basename: LIPASE:5,AMYLASE:5 in the last 168 hours No results found for this basename: AMMONIA:3 in the last 168 hours  CBC:  Lab 02/10/12 0827 02/10/12 0051  WBC 11.5* 11.4*  NEUTROABS -- --  HGB 11.9* 12.6  HCT 37.1 37.7  MCV 93.7 93.5  PLT 421* 478*    Cardiac Enzymes: No results found for this basename: CKTOTAL:5,CKMB:5,CKMBINDEX:5,TROPONINI:5 in the last 168 hours  Lipid Panel: No results found for this basename: CHOL:5,TRIG:5,HDL:5,CHOLHDL:5,VLDL:5,LDLCALC:5 in the last 168 hours  CBG: No results found for this basename: GLUCAP:5 in the last 168 hours  Microbiology: Results for orders placed during the hospital encounter of 02/10/12  URINE CULTURE     Status: Normal   Collection Time   02/10/12  1:47 AM      Component Value Range Status Comment   Specimen Description URINE, RANDOM   Final    Special Requests NONE  Final    Culture  Setup Time 02/10/2012 11:16   Final    Colony Count >=100,000 COLONIES/ML   Final    Culture     Final    Value: DIPHTHEROIDS(CORYNEBACTERIUM SPECIES)     Note: Standardized susceptibility testing for this organism is not available.   Report Status 02/11/2012 FINAL   Final     Coagulation Studies: No results found for this basename: LABPROT:5,INR:5 in the last 72 hours  Imaging: Mr Brain Wo Contrast  02/12/2012  *RADIOLOGY REPORT*  Clinical Data:  Right lower  extremity weakness  MRI HEAD WITHOUT CONTRAST MRI CERVICAL SPINE WITHOUT CONTRAST  Technique:  Multiplanar, multiecho pulse sequences of the brain and surrounding structures, and cervical spine, to include the craniocervical junction and cervicothoracic junction, were obtained without intravenous contrast.  Comparison:  Head CT 08/19/2007  MRI HEAD  Findings:  Diffusion imaging does not show any acute or subacute infarction.  There are advanced chronic appearing small vessel changes throughout the pons.  No focal cerebellar insult.  The cerebral hemispheres show moderate chronic appearing small vessel changes affecting the deep and subcortical white matter.  No cortical or large vessel territory infarction.  No mass lesion, hemorrhage, hydrocephalus or extra-axial collection.  The pituitary gland is normal.  Sinuses, middle ears and mastoids are clear. Major vessels are patent at the base of the brain.  IMPRESSION: Advanced chronic appearing small vessel changes affecting the pons and the cerebral hemispheric white matter.  No identifiably acute or subacute insult.  MRI CERVICAL SPINE  Findings: Alignment is normal.  There are minor non compressive disc bulges from C3-4 through C6-7.  There is no disc herniation or any compressive narrowing of the canal or foramina.  No articular pathology.  No focal osseous lesion.  No abnormal cord signal.  IMPRESSION: No significant lesion in the cervical spine.  Mild non compressive disc bulges.   Original Report Authenticated By: Paulina Fusi, M.D.    Mr Cervical Spine Wo Contrast  02/12/2012  *RADIOLOGY REPORT*  Clinical Data:  Right lower extremity weakness  MRI HEAD WITHOUT CONTRAST MRI CERVICAL SPINE WITHOUT CONTRAST  Technique:  Multiplanar, multiecho pulse sequences of the brain and surrounding structures, and cervical spine, to include the craniocervical junction and cervicothoracic junction, were obtained without intravenous contrast.  Comparison:  Head CT 08/19/2007   MRI HEAD  Findings:  Diffusion imaging does not show any acute or subacute infarction.  There are advanced chronic appearing small vessel changes throughout the pons.  No focal cerebellar insult.  The cerebral hemispheres show moderate chronic appearing small vessel changes affecting the deep and subcortical white matter.  No cortical or large vessel territory infarction.  No mass lesion, hemorrhage, hydrocephalus or extra-axial collection.  The pituitary gland is normal.  Sinuses, middle ears and mastoids are clear. Major vessels are patent at the base of the brain.  IMPRESSION: Advanced chronic appearing small vessel changes affecting the pons and the cerebral hemispheric white matter.  No identifiably acute or subacute insult.  MRI CERVICAL SPINE  Findings: Alignment is normal.  There are minor non compressive disc bulges from C3-4 through C6-7.  There is no disc herniation or any compressive narrowing of the canal or foramina.  No articular pathology.  No focal osseous lesion.  No abnormal cord signal.  IMPRESSION: No significant lesion in the cervical spine.  Mild non compressive disc bulges.   Original Report Authenticated By: Paulina Fusi, M.D.    Mr Thoracic Spine W Wo Contrast  02/12/2012  *RADIOLOGY REPORT*  Clinical Data: 72 year old female with severe right lower extremity weakness, difficulty using the right lower extremity, but is able to walk with walker.  Recently unable to void.  Recent episode of back pain radiating to the right buttock.  MRI THORACIC SPINE WITHOUT AND WITH CONTRAST  Technique:  Multiplanar and multiecho pulse sequences of the thoracic spine were obtained without and with intravenous contrast.  Contrast:  15 ml MultiHance.  Comparison: Lumbar MRI without contrast 02/09/2012.  Chest CTs 04/21/2010 and earlier.  Brain MRI 02/11/2012.  Findings: Patchy increased STIR and T2 signal within the lower thoracic spinal cord, with epicenter at the lower T11 vertebral level, and extension  across about 1.5 vertebral bodies.  The abnormality is primarily in the right hemi cord, perhaps with only minimal extension into or across the midline along its lower extent.  Following contrast, there is patchy and nodular enhancement of the affected area, but there is no expansion of the spinal cord.  The spinal cord levels above and below the lesion are normal.  The conus medullaris appears be normal and is mostly visualized, terminates below L1.  Normal thoracic vertebral height, alignment, and marrow signal.   Visualized paraspinal soft tissues are within normal limits. Capacious thoracic spinal canal.  No spinal stenosis.  No significant degenerative changes.  Negative visualized thoracic viscera including the signal associated with the visualized thoracic and upper abdominal aorta.  There are multiple small circumscribed T2 hyperintense areas in the liver which do not appear to enhance, compatible with biliary cysts or hamartomas.  IMPRESSION: Non-expansile signal abnormality in the right hemi cord at T11-T12. Associated patchy enhancement.  Conus medullaris appears to be spared, and no other areas of abnormal thoracic spinal cord signal identified. Case presentation and imaging findings discussed at length with Dr. Thana Farr. While some aspects of the presentation could indicate a cord infarct, the unilateral involvement makes that seem unlikely. At this point I favor an inflammatory process of the cord such as neurosarcoidosis, similar inflammatory myelitis, or demyelination (ADEM, etc). The patient appears to have a remote history of "adenocarcinoma NOS" but with absent mass effect, metastatic disease and/or tumor is unlikely.   Original Report Authenticated By: Erskine Speed, M.D.     Medications:  I have reviewed the patient's current medications. Scheduled:   . aspirin  81 mg Oral Daily  . enoxaparin (LOVENOX) injection  40 mg Subcutaneous Q24H  . pantoprazole  40 mg Oral Daily  . sodium  chloride  3 mL Intravenous Q12H  . triamterene-hydrochlorothiazide  1 each Oral Q M,W,F  . verapamil  80 mg Oral TID PC & HS    Assessment/Plan: Patient Active Hospital Problem List: RLE weakness/urinary retention (02/10/2012)   Assessment: Abnormality seen on imaging.  Patient wishes no further work up at this time.  Exam stable.    Plan: 1. Continued therapy  2. Consider repeat MRI in 1-2 months.     LOS: 3 days   Thana Farr, MD [facility] [phone] 02/13/2012  12:57 PM

## 2012-02-13 NOTE — Discharge Summary (Signed)
Physician Discharge Summary  Amy Black AVW:[id number] DOB: 1940-10-25 DOA: 02/10/2012  PCP: Rudi Heap, MD  Admit date: 02/10/2012 Discharge date: 02/13/2012  Time spent: 30 minutes  Recommendations for Outpatient Follow-up:  1. Need MRI in 1 to 2 month.   Discharge Diagnoses:  Flaccid paralysis of legs  ADENOCARCINOMA  CAD  Urinary retention   Discharge Condition: Stable.  Diet recommendation: [employer].  Filed Weights   02/09/12 2314 02/10/12 0500  Weight: 72.122 kg (159 lb) 63 kg (138 lb 14.2 oz)    History of present illness:  On Monday, December 30th the patient was driving to [facility] with her daughter for an appointment, she stated her right leg felt odd. By the time they right and at [facility] patient was very weak in the right extremity. She called Her orthopedic physician in [place]. She was able to see him in the office and he ordered an MRI. This which was done today-Friday . Since Monday evening the patient has been totally unable to move the right lower extremity. She reports no other neurological deficits. She was called by her orthopedics physician this evening about the results of the MRI and told to the only abnormality was she was retaining urine. The patient has a urologist, she called for an appointment and was given an appointment for Monday. She decided to come to the ER. In the ER a Foley was placed and 1200 cc of urine was drained. Since the decompression of her bladder the patient has developed myoclonic jerks in the right lower extremity. We do not have a copy of the MRI done by the orthopedic physician. The patient has not been ill, no viral-type symptoms, no fevers, chills and nausea, vomiting or diarrhea. The hospitalist was called with request to admit.    Hospital Course:   Flaccid paralysis of legs  Patient presents with right lower extremity weakness , MRI thoracic spine show Non-expansile signal abnormality in the right hemi cord at  T11-T12. Associated patchy enhancement. Conus medullaris appears to be  spared, and no other areas of abnormal thoracic spinal cord signal identified. Unclear etiology. Patient refuse further test like LP. Patient understand risk and benefit. She agree to have an MRI in 1 or 2 month.  Patient continues to have right lower extremity paralysis  Inpatient rehabilitation evaluation accepted patient.   Urinary retention  Good urine output  Continue foley catheter Hypertension  Continue Triamterene-- HCTZ and verapamil   Procedures:  None.  Consultations:  Dr Thad Ranger. Neurology.   Discharge Exam: Filed Vitals:   02/12/12 2058 02/13/12 0500 02/13/12 0932 02/13/12 1121  BP: 103/55 94/56 102/68   Pulse: 75 84 80   Temp: 97.8 F (36.6 C) 98.1 F (36.7 C)    TempSrc: Oral Oral    Resp: 20 18  20   Height:      Weight:      SpO2: 100% 99%  97%    General: No Distress. Cardiovascular: S 1, S 2 RRR Respiratory: CTA  Discharge Instructions  Discharge Orders    Future Appointments: Provider: Department: Dept Phone: Center:   07/25/2012 12:00 PM Rachael Fee [facility] [phone] None   07/25/2012 12:30 PM Josph Macho, MD [facility] [phone] None       Medication List     As of 02/13/2012  2:22 PM    TAKE these medications         ALPRAZolam 0.5  MG tablet   Commonly known as: XANAX   Take 0.5 mg by mouth 2 (two) times daily as needed.      aspirin 81 MG tablet   Take 81 mg by mouth daily.      DYAZIDE 37.5-25 MG per capsule   Generic drug: triamterene-hydrochlorothiazide   Take 1 capsule by mouth every Monday, Wednesday, and Friday.      NITROSTAT 0.4 MG SL tablet   Generic drug: nitroGLYCERIN   Place 0.4 mg under the tongue every 5 (five) minutes as needed.      pantoprazole 40 MG tablet   Commonly known as: PROTONIX   Take 40 mg by mouth daily.      verapamil 80 MG tablet   Commonly known  as: CALAN   Take 80 mg by mouth 4 (four) times daily - after meals and at bedtime.          The results of significant diagnostics from this hospitalization (including imaging, microbiology, ancillary and laboratory) are listed below for reference.    Significant Diagnostic Studies: Mr Brain Wo Contrast  02/12/2012  *RADIOLOGY REPORT*  Clinical Data:  Right lower extremity weakness  MRI HEAD WITHOUT CONTRAST MRI CERVICAL SPINE WITHOUT CONTRAST  Technique:  Multiplanar, multiecho pulse sequences of the brain and surrounding structures, and cervical spine, to include the craniocervical junction and cervicothoracic junction, were obtained without intravenous contrast.  Comparison:  Head CT 08/19/2007  MRI HEAD  Findings:  Diffusion imaging does not show any acute or subacute infarction.  There are advanced chronic appearing small vessel changes throughout the pons.  No focal cerebellar insult.  The cerebral hemispheres show moderate chronic appearing small vessel changes affecting the deep and subcortical white matter.  No cortical or large vessel territory infarction.  No mass lesion, hemorrhage, hydrocephalus or extra-axial collection.  The pituitary gland is normal.  Sinuses, middle ears and mastoids are clear. Major vessels are patent at the base of the brain.  IMPRESSION: Advanced chronic appearing small vessel changes affecting the pons and the cerebral hemispheric white matter.  No identifiably acute or subacute insult.  MRI CERVICAL SPINE  Findings: Alignment is normal.  There are minor non compressive disc bulges from C3-4 through C6-7.  There is no disc herniation or any compressive narrowing of the canal or foramina.  No articular pathology.  No focal osseous lesion.  No abnormal cord signal.  IMPRESSION: No significant lesion in the cervical spine.  Mild non compressive disc bulges.   Original Report Authenticated By: Paulina Fusi, M.D.    Mr Cervical Spine Wo Contrast  02/12/2012  *RADIOLOGY  REPORT*  Clinical Data:  Right lower extremity weakness  MRI HEAD WITHOUT CONTRAST MRI CERVICAL SPINE WITHOUT CONTRAST  Technique:  Multiplanar, multiecho pulse sequences of the brain and surrounding structures, and cervical spine, to include the craniocervical junction and cervicothoracic junction, were obtained without intravenous contrast.  Comparison:  Head CT 08/19/2007  MRI HEAD  Findings:  Diffusion imaging does not show any acute or subacute infarction.  There are advanced chronic appearing small vessel changes throughout the pons.  No focal cerebellar insult.  The cerebral hemispheres show moderate chronic appearing small vessel changes affecting the deep and subcortical white matter.  No cortical or large vessel territory infarction.  No mass lesion, hemorrhage, hydrocephalus or extra-axial collection.  The pituitary gland is normal.  Sinuses, middle ears and mastoids are clear. Major vessels are patent at the base of the brain.  IMPRESSION: Advanced chronic appearing  small vessel changes affecting the pons and the cerebral hemispheric white matter.  No identifiably acute or subacute insult.  MRI CERVICAL SPINE  Findings: Alignment is normal.  There are minor non compressive disc bulges from C3-4 through C6-7.  There is no disc herniation or any compressive narrowing of the canal or foramina.  No articular pathology.  No focal osseous lesion.  No abnormal cord signal.  IMPRESSION: No significant lesion in the cervical spine.  Mild non compressive disc bulges.   Original Report Authenticated By: Paulina Fusi, M.D.    Mr Lumbar Spine W Contrast  02/10/2012  *RADIOLOGY REPORT*  Clinical Data: Right leg weakness.  MRI LUMBAR SPINE WITH CONTRAST  Technique:  Multiplanar and multiecho pulse sequences of the lumbar spine were obtained with intravenous contrast.  Contrast: 13mL MULTIHANCE GADOBENATE DIMEGLUMINE 529 MG/ML IV SOLN  Comparison: Unenhanced lumbar MRI from yesterday  Findings: Image quality degraded  by motion.  Normal lumbar alignment.  Negative for fracture or mass.  No enhancing lesion is seen.  No evidence of epidural abscess.  No bone marrow edema is present.  Interval decompression of the urinary bladder and right renal collecting system.  IMPRESSION: No acute abnormality.  No   fracture or mass.  Negative for epidural abscess.   Original Report Authenticated By: Janeece Riggers, M.D.    Mr Thoracic Spine W Wo Contrast  02/12/2012  *RADIOLOGY REPORT*  Clinical Data: 72 year old female with severe right lower extremity weakness, difficulty using the right lower extremity, but is able to walk with walker.  Recently unable to void.  Recent episode of back pain radiating to the right buttock.  MRI THORACIC SPINE WITHOUT AND WITH CONTRAST  Technique:  Multiplanar and multiecho pulse sequences of the thoracic spine were obtained without and with intravenous contrast.  Contrast:  15 ml MultiHance.  Comparison: Lumbar MRI without contrast 02/09/2012.  Chest CTs 04/21/2010 and earlier.  Brain MRI 02/11/2012.  Findings: Patchy increased STIR and T2 signal within the lower thoracic spinal cord, with epicenter at the lower T11 vertebral level, and extension across about 1.5 vertebral bodies.  The abnormality is primarily in the right hemi cord, perhaps with only minimal extension into or across the midline along its lower extent.  Following contrast, there is patchy and nodular enhancement of the affected area, but there is no expansion of the spinal cord.  The spinal cord levels above and below the lesion are normal.  The conus medullaris appears be normal and is mostly visualized, terminates below L1.  Normal thoracic vertebral height, alignment, and marrow signal.   Visualized paraspinal soft tissues are within normal limits. Capacious thoracic spinal canal.  No spinal stenosis.  No significant degenerative changes.  Negative visualized thoracic viscera including the signal associated with the visualized thoracic and  upper abdominal aorta.  There are multiple small circumscribed T2 hyperintense areas in the liver which do not appear to enhance, compatible with biliary cysts or hamartomas.  IMPRESSION: Non-expansile signal abnormality in the right hemi cord at T11-T12. Associated patchy enhancement.  Conus medullaris appears to be spared, and no other areas of abnormal thoracic spinal cord signal identified. Case presentation and imaging findings discussed at length with Dr. Thana Farr. While some aspects of the presentation could indicate a cord infarct, the unilateral involvement makes that seem unlikely. At this point I favor an inflammatory process of the cord such as neurosarcoidosis, similar inflammatory myelitis, or demyelination (ADEM, etc). The patient appears to have a remote history of "adenocarcinoma  NOS" but with absent mass effect, metastatic disease and/or tumor is unlikely.   Original Report Authenticated By: Erskine Speed, M.D.    Mr Pelvis W Wo Contrast  02/11/2012  *RADIOLOGY REPORT*  Clinical Data: Right leg weakness acutely, urinary bladder distention on prior exam  MRI PELVIS WITHOUT AND WITH CONTRAST  Technique:  Multiplanar multisequence MR imaging of the pelvis was performed both before and after administration of intravenous contrast.  Contrast: 13mL MULTIHANCE GADOBENATE DIMEGLUMINE 529 MG/ML IV SOLN  Comparison: [facility] lumbar spine MRI 02/09/2012  Findings: Bladder is partially decompressed with a Foley catheter in place.  Evidence of prior hysterectomy and oophorectomy bilaterally.  No pelvic free fluid or lymphadenopathy. Osseous structures are within normal limits.  Postcontrast images are mildly degraded by motion but no abnormal enhancing mass is identified.  Moderate amount of stool within nondilated rectum.  A few colonic diverticuli are noted without surrounding edema or colonic wall thickening to suggest diverticulitis.  IMPRESSION: No enhancing mass or other  abnormality to explain the previous finding of urinary bladder distention or right lower extremity weakness.   Original Report Authenticated By: Christiana Pellant, M.D.     Microbiology: Recent Results (from the past 240 hour(s))  URINE CULTURE     Status: Normal   Collection Time   02/10/12  1:47 AM      Component Value Range Status Comment   Specimen Description URINE, RANDOM   Final    Special Requests NONE   Final    Culture  Setup Time 02/10/2012 11:16   Final    Colony Count >=100,000 COLONIES/ML   Final    Culture     Final    Value: DIPHTHEROIDS(CORYNEBACTERIUM SPECIES)     Note: Standardized susceptibility testing for this organism is not available.   Report Status 02/11/2012 FINAL   Final      Labs: Basic Metabolic Panel:  Lab 02/10/12 1610 02/10/12 0051  NA 139 134*  K 4.3 4.7  CL 102 99  CO2 28 24  GLUCOSE 103* 98  BUN 20 20  CREATININE 0.88 0.91  CALCIUM 9.3 9.8  MG -- --  PHOS -- --   CBC:  Lab 02/10/12 0827 02/10/12 0051  WBC 11.5* 11.4*  NEUTROABS -- --  HGB 11.9* 12.6  HCT 37.1 37.7  MCV 93.7 93.5  PLT 421* 478*   Cardiac Enzymes: No results found for this basename: CKTOTAL:5,CKMB:5,CKMBINDEX:5,TROPONINI:5 in the last 168 hours BNP: BNP (last 3 results) No results found for this basename: PROBNP:3 in the last 8760 hours CBG: No results found for this basename: GLUCAP:5 in the last 168 hours     Signed:  REGALADO,BELKYS  [facility] 02/13/2012, 2:22 PM

## 2012-02-13 NOTE — Plan of Care (Signed)
Overall Plan of Care [facility]) Patient Details Name: Amy Black MRN: [id number] DOB: Apr 16, 1940  Diagnosis:  Thoracic myelopathy  Primary Diagnosis:    Weakness of right leg Co-morbidities: CAD, HTN,   Functional Problem List  Patient demonstrates impairments in the following areas: Balance, Bladder, Endurance, Medication Management, Nutrition, Safety and Sensory   Basic ADL's: bathing, dressing and toileting Advanced ADL's: simple meal preparation  Transfers:  bed mobility, bed to chair, toilet, tub/shower, car and furniture Locomotion:  ambulation, wheelchair mobility and stairs  Additional Impairments:  None  Anticipated Outcomes Item Anticipated Outcome  Eating/Swallowing  independent  Basic self-care  Modified independent  Tolieting  Mod I  Bowel/Bladder  Cont of bowel and bladder  Transfers  Mod I  Locomotion    Communication    Cognition    Pain  Pain level </=2  Safety/Judgment    Other     Therapy Plan: PT Intensity: Minimum of 2-3x/day, 45 to 90 minutes PT Frequency: 5 out of 7 days PT Duration Estimated Length of Stay: 7-10 days OT Intensity: Minimum of 1-2 x/day, 45 to 90 minutes OT Frequency: 5 out of 7 days OT Duration/Estimated Length of Stay: 7-10 days      Team Interventions: Item RN PT OT SLP SW TR Other  Self Care/Advanced ADL Retraining  x x      Neuromuscular Re-Education  x x      Therapeutic Activities  x x      UE/LE Strength Training/ROM  x x      UE/LE Coordination Activities  x       Visual/Perceptual Remediation/Compensation         DME/Adaptive Equipment Instruction  x x      Therapeutic Exercise  x x      Balance/Vestibular Training  x x      Patient/Family Education x x x      Cognitive Remediation/Compensation         Functional Mobility Training  x x      Ambulation/Gait Training  x       Museum/gallery curator  x       Wheelchair Propulsion/Positioning  x       Functional Statistician  x       Community  Reintegration  x x      Dysphagia/Aspiration Film/video editor         Bladder Management x        Bowel Management         Disease Management/Prevention x        Pain Management         Medication Management x        Skin Care/Wound Management  x       Splinting/Orthotics  x       Discharge Planning x x x      Psychosocial Support x x x                             Team Discharge Planning: Destination: PT- Home ,OT- Home , SLP-  Projected Follow-up: PT- HHPT, OT-  None, SLP-  Projected Equipment Needs: PT-  RW, possibly WC for community access-TBD, OT- 3 in 1 bedside comode, SLP-  Patient/family involved in discharge planning: PT-  Patient,  OT-Patient, SLP-   MD ELOS: one week planned Medical Rehab Prognosis:  Good Assessment: This patient was admitted  for CIR therapies with modified independent goals. The team was addressing NMR, functional mobility, adaptive techniques and equipment, ADL's, potential orthotics. Unfortunately, this patient became disenchanted with being in the hospital and apparently other personal/family matter surfaced which affected her participation and desire to be in the hospital. Against our recommendations this patient left the hospital today before meeting her rehab goals. The patient was set up with outpt follow up, and I will see her in the office in a month. As much education was provided as possible.    See Team Conference Notes for weekly updates to the plan of care

## 2012-02-13 NOTE — Progress Notes (Signed)
Patient arrived to the unit around 1625 with husband and sons. Oriented patient to the unit. Safety sheet explained and signed. Understood and demonstrated use of call bell system. Patient verbalized understanding and no further questions at this time.

## 2012-02-13 NOTE — Progress Notes (Signed)
NURSING PROGRESS NOTE  Amy Black [id number] Transfer Data: 02/13/2012 5:18 PM Attending Provider: Alba Cory, MD GNF:AOZHY, Dorinda Hill, MD Code Status: full   Amy Black is a 72 y.o. female patient transferred to 4000 No acute distress noted.  No c/o shortness of breath, no c/o chest pain.    Blood pressure 92/60, pulse 81, temperature 98.1 F (36.7 C), temperature source Oral, resp. rate 18, height 5\' 3"  (1.6 m), weight 63 kg (138 lb 14.2 oz), SpO2 100.00%.    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  Past Surgical History:   has past surgical history that includes resection of leg lesion and radiation (2004); Diagnostic laparoscopy; extensive lysis of adhesions; ;eft salpingo-oophorectomy; Abdominal hysterectomy; and Pelvic laparoscopy (1992).  Social History:   reports that she quit smoking about 39 years ago. Her smoking use included Cigarettes. She has never used smokeless tobacco. She reports that she does not drink alcohol or use illicit drugs.  Skin: intact  Orientation to room, and floor completed with information packet given to patient/family.   SR up x 2, fall assessment complete, with patient and family able to verbalize understanding of risk associated with falls, and verbalized understanding to call for assistance before getting out of bed.   Call light within reach. Patient able to voice and demonstrate understanding of unit orientation instructions.   Will cont to eval and treat per MD orders.  Barbie Croston, Elmarie Mainland, RN

## 2012-02-13 NOTE — Progress Notes (Signed)
OT NOTE  Spoke directly to Melanee Spry (CIR coordinator) pt will d/c to CIR today. OT to defer all further therapy to CIR. Thank you.  Harrel Carina [place]   OTR/L Pager: [phone] Office: [phone] .

## 2012-02-13 NOTE — H&P (Signed)
Physical Medicine and Rehabilitation Admission H&P  Chief Complaint   Patient presents with   .  Extremity Weakness   :  HPI: Amy Black is a 72 y.o. right-handed female with history of coronary artery disease, hypertension. Admitted 02/10/2012 with right lower extremity weakness. Patient reported that the symptoms started on Monday, 02/05/2012 while she was a passenger in a car while her daughter was driving. She noted a sudden shooting pain down her right hip that lasted only a short time and she could not move her right lower extremity. Despite the weakness of right lower extremity she was able to walk but stumbled when doing so. Patient also complained of urinary retention over the past 2-3 months. MRI of lumbar spine showed no acute abnormalities nor fracture or mass. MRI of the pelvis also unremarkable. She did require urinary catheterization in the emergency department and drained for about 1200 cc. Neurology services consulted with workup ongoing. MRI of the brain showed no acute abnormalities. An MRI thoracic spine completed 02/12/2012 showed non-expansile signal abnormality in the right hemi-cord at T11-T12. Conus medullaris appeared to be spared and no other abnormal thoracic spinal cord signal identified. There was question of possible inflammatory process of the cord such as neurosarcoidosis, similar inflammatory myelitis or demyelination or possible cord infarct. Recommendations were made for lumbar puncture however patient refused. Recommendations were made for consideration of followup MRI in the next 1-2 months for comparison. Maintained on subcutaneous Lovenox for DVT prophylaxis. Physical therapy evaluation completed and ongoing recommendations made for physical medicine rehabilitation consult to consider inpatient rehabilitation services. Patient was felt to be a gait candidate for inpatient rehabilitation services and was admitted for comprehensive rehabilitation program    Review  of Systems  Genitourinary:  Urinary retention  Musculoskeletal: Positive for myalgias.  Neurological: Positive for tingling and weakness.  Psychiatric/Behavioral: Anxiety, suicidal ideas and substance abuse.  All other systems reviewed and are negative  Past Medical History   Diagnosis  Date   .  CAD (coronary artery disease)    .  Irritable bowel syndrome    .  Diverticulosis of colon (without mention of hemorrhage)    .  hepatic cyst    .  Hyperlipidemia    .  Adenosquamous carcinoma(M8560/3)    .  HTN (hypertension)      PT. DENIES   .  History of cardiac catheterization    .  Unstable angina    .  Adenocarcinoma      LEFT LEG   .  Insomnia    .  Vitamin D deficiency    .  Adenocarcinoma of unknown primary  02/06/2011    Past Surgical History   Procedure  Date   .  Resection of leg lesion and radiation  2004   .  Diagnostic laparoscopy    .  Extensive lysis of adhesions    .  ;eft salpingo-oophorectomy    .  Abdominal hysterectomy    .  Pelvic laparoscopy  1992     RSO, AND LSO ON 2007    Family History   Problem  Relation  Age of Onset   .  Diabetes        Multiple family members on both sides    .  Coronary artery disease        Multiple family members on both sides    .  Cancer  Mother       Bladder    .  Heart disease  Mother    .  Hypertension  Mother    .  Colon cancer  Neg Hx    .  Heart disease  Father     Social History: reports that she quit smoking about 39 years ago. Her smoking use included Cigarettes. She has never used smokeless tobacco. She reports that she does not drink alcohol or use illicit drugs.  Allergies:  Allergies   Allergen  Reactions   .  Iodine     Medications Prior to Admission   Medication  Sig  Dispense  Refill   .  ALPRAZolam (XANAX) 0.5 MG tablet  Take 0.5 mg by mouth 2 (two) times daily as needed.     Marland Kitchen  aspirin 81 MG tablet  Take 81 mg by mouth daily.     Marland Kitchen  NITROSTAT 0.4 MG SL tablet  Place 0.4 mg under the tongue  every 5 (five) minutes as needed.     .  pantoprazole (PROTONIX) 40 MG tablet  Take 40 mg by mouth daily.     Marland Kitchen  triamterene-hydrochlorothiazide (DYAZIDE) 37.5-25 MG per capsule  Take 1 capsule by mouth every Monday, Wednesday, and Friday.     .  verapamil (CALAN) 80 MG tablet  Take 80 mg by mouth 4 (four) times daily - after meals and at bedtime.      Home:  Home Living  Lives With: Spouse;Daughter  Available Help at Discharge: Family;Available PRN/intermittently  Type of Home: House  Home Access: Stairs to enter  [employer] of Steps: 8  Entrance Stairs-Rails: Right  Home Layout: Two level;Able to live on main level with bedroom/bathroom  Bathroom Shower/Tub: Tub/shower unit;Curtain  Bathroom Toilet: Standard  Home Adaptive Equipment: None  Additional Comments: pt's husband works first shift and daughter works second so she has someone with her nearly all the time  Functional History:  Prior Function  Able to Take Stairs?: Yes  Vocation: Retired  Functional Status:  Mobility:  Bed Mobility  Bed Mobility: Rolling Left;Left Sidelying to Sit;Sitting - Scoot to Edge of Bed  Rolling Left: 4: Min assist  Left Sidelying to Sit: 4: Min assist;With rails;HOB elevated  Sitting - Scoot to [employer] of Bed: 4: Min assist;With rail  Transfers  Transfers: Sit to Stand;Stand to [employer] Transfers  Sit to Stand: With upper extremity assist;4: Min assist;From chair/3-in-1  Stand to Sit: 4: Min assist;Without upper extremity assist;To chair/3-in-1  Stand Pivot Transfers: 4: Min assist  Ambulation/Gait  Ambulation/Gait Assistance: 3: Mod assist  Ambulation Distance (Feet): 45 Feet (45' x 1, 25' x 1)  Assistive device: Rolling walker  Ambulation/Gait Assistance Details: Assist to advance rt leg and to stabilize knee to prevent buckling. Constant verbal/tactile cues for gait sequence.  Gait Pattern: Step-to pattern;Decreased step length - right;Right flexed knee in stance  Stairs:  No  Wheelchair Mobility  Wheelchair Mobility: No  ADL:  ADL  Eating/Feeding: Independent  Where Assessed - Eating/Feeding: Chair  Grooming: Simulated;Minimal assistance  Where Assessed - Grooming: Unsupported standing  Upper Body Bathing: Simulated;Set up  Where Assessed - Upper Body Bathing: Unsupported sitting  Lower Body Bathing: Simulated;Minimal assistance  Where Assessed - Lower Body Bathing: Unsupported sit to stand  Upper Body Dressing: Simulated;Set up  Where Assessed - Upper Body Dressing: Unsupported sitting  Lower Body Dressing: Simulated;Minimal assistance  Where Assessed - Lower Body Dressing: Supported sit to stand  Toilet Transfer: Performed;Minimal assistance  Toilet Transfer Method: Other (comment) (ambulating with RW)  Acupuncturist: Comfort  height toilet;Grab bars;Other (comment) (with use of RW)  Tub/Shower Transfer Method: Not assessed  Equipment Used: Rolling walker  Transfers/Ambulation Related to ADLs: Pt overall min assist for mobility to the bathroom with use of a RW. Decreased ability to efficiently advance the RLE.  ADL Comments: Pt overall min assist level for dynamic standing balance and sit to stand. Decreased ability to donn her right sock secondary to not being able to lift her RLE in sitting. Pt very motivated to participate in therapy.  Cognition:  Cognition  Arousal/Alertness: Awake/alert  Orientation Level: Oriented X4  Cognition  Overall Cognitive Status: Appears within functional limits for tasks assessed/performed  Arousal/Alertness: Awake/alert  Orientation Level: Appears intact for tasks assessed  Behavior During Session: [facility] for tasks performed  Cognition - Other Comments: pt with decreased insight into limitations but seems mostly due to her being in bed past several days and not trying to functionally use the right leg  Blood pressure 102/68, pulse 80, temperature 98.1 F (36.7 C), temperature source Oral, resp. rate 20,  height 5\' 3"  (1.6 m), weight 63 kg (138 lb 14.2 oz), SpO2 97.00%.  Physical Exam  Vitals reviewed.  Constitutional: She is oriented to person, place, and time. She appears well-developed.  HENT:  Head: Normocephalic.  Eyes:  Pupils round and reactive to light  Neck: Normal range of motion. Neck supple. No thyromegaly present.  Cardiovascular: Normal rate and regular rhythm.  Pulmonary/Chest: Effort normal and breath sounds normal. She has no wheezes.  Abdominal: Soft. Bowel sounds are normal. She exhibits no distension. There is no tenderness.  Neurological: She is alert and oriented to person, place, and time.  Follows full commands. ?mild right facial weakness/asymmetry. BUE is 4+/5 without sensory findings.  R HF 1+2/5. R KE 1+2/5. 0/5 ADF and APF. Decreased sensation to light touch over the L4-S1. Has bowel and bladder sensation. Dtr's 2+ left patella, ankle and 1+ Right LE Skin: Skin is warm and dry.  Psychiatric: She has a normal mood and affect. Her behavior is normal. Judgment and thought content normal  Results for orders placed during the hospital encounter of 02/10/12 (from the past 48 hour(s))   SEDIMENTATION RATE Status: Abnormal    Collection Time    02/12/12 12:13 PM   Component  Value  Range  Comment    Sed Rate  23 (*)  0 - 22 mm/hr    B. BURGDORFI ANTIBODIES Status: Normal    Collection Time    02/12/12 12:13 PM   Component  Value  Range  Comment    B burgdorferi Ab IgG+IgM  0.56      Mr Brain Wo Contrast  02/12/2012 *RADIOLOGY REPORT* Clinical Data: Right lower extremity weakness MRI HEAD WITHOUT CONTRAST MRI CERVICAL SPINE WITHOUT CONTRAST Technique: Multiplanar, multiecho pulse sequences of the brain and surrounding structures, and cervical spine, to include the craniocervical junction and cervicothoracic junction, were obtained without intravenous contrast. Comparison: Head CT 08/19/2007 MRI HEAD Findings: Diffusion imaging does not show any acute or subacute infarction.  There are advanced chronic appearing small vessel changes throughout the pons. No focal cerebellar insult. The cerebral hemispheres show moderate chronic appearing small vessel changes affecting the deep and subcortical white matter. No cortical or large vessel territory infarction. No mass lesion, hemorrhage, hydrocephalus or extra-axial collection. The pituitary gland is normal. Sinuses, middle ears and mastoids are clear. Major vessels are patent at the base of the brain. IMPRESSION: Advanced chronic appearing small vessel changes affecting the pons and  the cerebral hemispheric white matter. No identifiably acute or subacute insult. MRI CERVICAL SPINE Findings: Alignment is normal. There are minor non compressive disc bulges from C3-4 through C6-7. There is no disc herniation or any compressive narrowing of the canal or foramina. No articular pathology. No focal osseous lesion. No abnormal cord signal. IMPRESSION: No significant lesion in the cervical spine. Mild non compressive disc bulges. Original Report Authenticated By: Paulina Fusi, M.D.  Mr Cervical Spine Wo Contrast  02/12/2012 *RADIOLOGY REPORT* Clinical Data: Right lower extremity weakness MRI HEAD WITHOUT CONTRAST MRI CERVICAL SPINE WITHOUT CONTRAST Technique: Multiplanar, multiecho pulse sequences of the brain and surrounding structures, and cervical spine, to include the craniocervical junction and cervicothoracic junction, were obtained without intravenous contrast. Comparison: Head CT 08/19/2007 MRI HEAD Findings: Diffusion imaging does not show any acute or subacute infarction. There are advanced chronic appearing small vessel changes throughout the pons. No focal cerebellar insult. The cerebral hemispheres show moderate chronic appearing small vessel changes affecting the deep and subcortical white matter. No cortical or large vessel territory infarction. No mass lesion, hemorrhage, hydrocephalus or extra-axial collection. The pituitary gland is  normal. Sinuses, middle ears and mastoids are clear. Major vessels are patent at the base of the brain. IMPRESSION: Advanced chronic appearing small vessel changes affecting the pons and the cerebral hemispheric white matter. No identifiably acute or subacute insult. MRI CERVICAL SPINE Findings: Alignment is normal. There are minor non compressive disc bulges from C3-4 through C6-7. There is no disc herniation or any compressive narrowing of the canal or foramina. No articular pathology. No focal osseous lesion. No abnormal cord signal. IMPRESSION: No significant lesion in the cervical spine. Mild non compressive disc bulges. Original Report Authenticated By: Paulina Fusi, M.D.  Mr Thoracic Spine W Wo Contrast  02/12/2012 *RADIOLOGY REPORT* Clinical Data: 72 year old female with severe right lower extremity weakness, difficulty using the right lower extremity, but is able to walk with walker. Recently unable to void. Recent episode of back pain radiating to the right buttock. MRI THORACIC SPINE WITHOUT AND WITH CONTRAST Technique: Multiplanar and multiecho pulse sequences of the thoracic spine were obtained without and with intravenous contrast. Contrast: 15 ml MultiHance. Comparison: Lumbar MRI without contrast 02/09/2012. Chest CTs 04/21/2010 and earlier. Brain MRI 02/11/2012. Findings: Patchy increased STIR and T2 signal within the lower thoracic spinal cord, with epicenter at the lower T11 vertebral level, and extension across about 1.5 vertebral bodies. The abnormality is primarily in the right hemi cord, perhaps with only minimal extension into or across the midline along its lower extent. Following contrast, there is patchy and nodular enhancement of the affected area, but there is no expansion of the spinal cord. The spinal cord levels above and below the lesion are normal. The conus medullaris appears be normal and is mostly visualized, terminates below L1. Normal thoracic vertebral height, alignment, and  marrow signal. Visualized paraspinal soft tissues are within normal limits. Capacious thoracic spinal canal. No spinal stenosis. No significant degenerative changes. Negative visualized thoracic viscera including the signal associated with the visualized thoracic and upper abdominal aorta. There are multiple small circumscribed T2 hyperintense areas in the liver which do not appear to enhance, compatible with biliary cysts or hamartomas. IMPRESSION: Non-expansile signal abnormality in the right hemi cord at T11-T12. Associated patchy enhancement. Conus medullaris appears to be spared, and no other areas of abnormal thoracic spinal cord signal identified. Case presentation and imaging findings discussed at length with Dr. Thana Farr. While some aspects of the presentation  could indicate a cord infarct, the unilateral involvement makes that seem unlikely. At this point I favor an inflammatory process of the cord such as neurosarcoidosis, similar inflammatory myelitis, or demyelination (ADEM, etc). The patient appears to have a remote history of "adenocarcinoma NOS" but with absent mass effect, metastatic disease and/or tumor is unlikely. Original Report Authenticated By: Erskine Speed, M.D.   Post Admission Physician Evaluation:  1. Functional deficits secondary to potential lumbar myelopathy with right lower extremity weakness with sensory loss. 2. Patient is admitted to receive collaborative, interdisciplinary care between the physiatrist, rehab nursing staff, and therapy team. 3. Patient's level of medical complexity and substantial therapy needs in context of that medical necessity cannot be provided at a lesser intensity of care such as a SNF. 4. Patient has experienced substantial functional loss from his/her baseline which was documented above under the "Functional History" and "Functional Status" headings. Judging by the patient's diagnosis, physical exam, and functional history, the patient has  potential for functional progress which will result in measurable gains while on inpatient rehab. These gains will be of substantial and practical use upon discharge in facilitating mobility and self-care at the household level. 5. Physiatrist will provide 24 hour management of medical needs as well as oversight of the therapy plan/treatment and provide guidance as appropriate regarding the interaction of the two. 6. 24 hour rehab nursing will assist with bladder management, bowel management, safety, skin/wound care, disease management, medication administration, pain management and patient education and help integrate therapy concepts, techniques,education, etc. 7. PT will assess and treat for: Lower extremity strength, range of motion, stamina, balance, functional mobility, safety, adaptive techniques and equipment, NMR, pain mgt, possible orthotic needs. Goals are: supervision to mod I. 8. OT will assess and treat for: ADL's, functional mobility, safety, upper extremity strength, adaptive techniques and equipment, NMR, pain. Goals are: mod I to supervision. 9. SLP will assess and treat for: n/a. Goals are: n/a. 10. Case Management and Social Worker will assess and treat for psychological issues and discharge planning. 11. Team conference will be held weekly to assess progress toward goals and to determine barriers to discharge. 12. Patient will receive at least 3 hours of therapy per day at least 5 days per week. 13. ELOS: 7-12 days Prognosis: excellent Medical Problem List and Plan:  1. Right lower showing weakness of unclear etiology, potentially due to myelopathy  -pt expressed to me that she would consider a lumbar puncture  -follow up MRI recommended as an outpt. 2. DVT Prophylaxis/Anticoagulation: Subcutaneous Lovenox. Monitor platelet counts any signs of bleeding  3. Mood: Xanax as needed.provide emotional support and positive reinforcement  4. Neuropsych: This patient is capable of making  decisions on his/her own behalf.  5. urinary retention. Check PVRs x3 and provide as needed. Check urine study.  6. Hypertension. Maxzide one tablet every Monday Wednesday Friday, verapamil 80 mg 3 times a day. Monitor the increased mobility  7. CAD with unstable angina. Continue aspirin therapy. Patient no complaints of chest pain or shortness of breath . She does use Nitrostat as needed  8. GERD. Protonix  Ivory Broad, MD  1/7/201

## 2012-02-14 ENCOUNTER — Inpatient Hospital Stay (HOSPITAL_COMMUNITY): Payer: Medicare Other | Admitting: Physical Therapy

## 2012-02-14 ENCOUNTER — Inpatient Hospital Stay (HOSPITAL_COMMUNITY): Payer: Medicare Other | Admitting: Occupational Therapy

## 2012-02-14 ENCOUNTER — Inpatient Hospital Stay (HOSPITAL_COMMUNITY): Payer: Medicare Other

## 2012-02-14 DIAGNOSIS — G959 Disease of spinal cord, unspecified: Secondary | ICD-10-CM

## 2012-02-14 DIAGNOSIS — R29898 Other symptoms and signs involving the musculoskeletal system: Secondary | ICD-10-CM

## 2012-02-14 LAB — COMPREHENSIVE METABOLIC PANEL
ALT: 9 U/L (ref 0–35)
AST: 12 U/L (ref 0–37)
Albumin: 3.3 g/dL — ABNORMAL LOW (ref 3.5–5.2)
Alkaline Phosphatase: 83 U/L (ref 39–117)
BUN: 17 mg/dL (ref 6–23)
CO2: 27 mEq/L (ref 19–32)
Calcium: 9.7 mg/dL (ref 8.4–10.5)
Chloride: 97 mEq/L (ref 96–112)
Creatinine, Ser: 0.84 mg/dL (ref 0.50–1.10)
GFR calc Af Amer: 80 mL/min — ABNORMAL LOW (ref >90)
GFR calc non Af Amer: 69 mL/min — ABNORMAL LOW (ref >90)
Glucose, Bld: 100 mg/dL — ABNORMAL HIGH (ref 70–99)
Potassium: 4.4 mEq/L (ref 3.5–5.1)
Sodium: 135 mEq/L (ref 135–145)
Total Bilirubin: 0.5 mg/dL (ref 0.3–1.2)
Total Protein: 7.1 g/dL (ref 6.0–8.3)

## 2012-02-14 LAB — CBC WITH DIFFERENTIAL/PLATELET
Basophils Absolute: 0.1 10*3/uL (ref 0.0–0.1)
Basophils Relative: 1 % (ref 0–1)
Eosinophils Absolute: 0.7 10*3/uL (ref 0.0–0.7)
Eosinophils Relative: 9 % — ABNORMAL HIGH (ref 0–5)
HCT: 37 % (ref 36.0–46.0)
Hemoglobin: 12.1 g/dL (ref 12.0–15.0)
Lymphocytes Relative: 37 % (ref 12–46)
Lymphs Abs: 3.1 10*3/uL (ref 0.7–4.0)
MCH: 30.4 pg (ref 26.0–34.0)
MCHC: 32.7 g/dL (ref 30.0–36.0)
MCV: 93 fL (ref 78.0–100.0)
Monocytes Absolute: 0.9 10*3/uL (ref 0.1–1.0)
Monocytes Relative: 11 % (ref 3–12)
Neutro Abs: 3.6 10*3/uL (ref 1.7–7.7)
Neutrophils Relative %: 43 % (ref 43–77)
Platelets: 425 10*3/uL — ABNORMAL HIGH (ref 150–400)
RBC: 3.98 MIL/uL (ref 3.87–5.11)
RDW: 13.9 % (ref 11.5–15.5)
WBC: 8.4 10*3/uL (ref 4.0–10.5)

## 2012-02-14 MED ORDER — TRAZODONE HCL 50 MG PO TABS
50.0000 mg | ORAL_TABLET | Freq: Every evening | ORAL | Status: DC | PRN
  Administered 2012-02-14: 50 mg via ORAL
  Filled 2012-02-14: qty 1

## 2012-02-14 MED ORDER — LORATADINE 10 MG PO TABS
10.0000 mg | ORAL_TABLET | Freq: Every day | ORAL | Status: DC
  Administered 2012-02-14 – 2012-02-16 (×3): 10 mg via ORAL
  Filled 2012-02-14 (×4): qty 1

## 2012-02-14 NOTE — Evaluation (Signed)
Occupational Therapy Assessment and Plan & Session Note  Patient Details  Name: Amy Black MRN: [id number] Date of Birth: Apr 24, 1940  OT Diagnosis: abnormal posture, acute pain, hemiplegia affecting dominant side and muscle weakness (generalized) Rehab Potential: Rehab Potential: Excellent ELOS: 7-10 days   Today's Date: 02/14/2012  Problem List:  Patient Active Problem List  Diagnosis  . ADENOCARCINOMA  . ANGINA, UNSTABLE  . CAD  . GERD  . DIVERTICULOSIS, COLON  . IRRITABLE BOWEL SYNDROME  . HEPATIC CYST  . DIARRHEA  . Anxiety  . Chest pain  . Irritable bowel disease  . Dysphagia  . Gastritis  . Duodenal nodule  . Adenocarcinoma of unknown primary  . Flaccid paralysis of legs  . Urinary retention  . Weakness of right leg    Past Medical History:  Past Medical History  Diagnosis Date  . CAD (coronary artery disease)   . Irritable bowel syndrome   . Diverticulosis of colon (without mention of hemorrhage)   . hepatic cyst   . Hyperlipidemia   . 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 HTN (hypertension)     PT. DENIES  . History of cardiac catheterization   . Unstable angina   . Adenocarcinoma     LEFT LEG  . Insomnia   . Vitamin D deficiency   . Adenocarcinoma of unknown primary 02/06/2011   Past Surgical History:  Past Surgical History  Procedure Date  . Resection of leg lesion and radiation 2004  . Diagnostic laparoscopy   . Extensive lysis of adhesions   . ;eft salpingo-oophorectomy   . Abdominal hysterectomy   . Pelvic laparoscopy 1992    RSO, AND LSO ON 2007    Clinical Impression: Amy Black is a 72 y.o. right-handed female with history of coronary artery disease, hypertension. Admitted 02/10/2012 with right lower extremity weakness. Patient reported that the symptoms started on Monday, 02/05/2012 while she was a passenger in a car while her daughter was driving. She noted a sudden shooting pain down her right hip that lasted only a short  time and she could not move her right lower extremity. Despite the weakness of right lower extremity she was able to walk but stumbled when doing so. Patient also complained of urinary retention over the past 2-3 months. MRI of lumbar spine showed no acute abnormalities nor fracture or mass. MRI of the pelvis also unremarkable. She did require urinary catheterization in the emergency department and drained for about 1200 cc. Neurology services consulted with workup ongoing. MRI of the brain showed no acute abnormalities. An MRI thoracic spine completed 02/12/2012 showed non-expansile signal abnormality in the right hemi-cord at T11-T12. Conus medullaris appeared to be spared and no other abnormal thoracic spinal cord signal identified. There was question of possible inflammatory process of the cord such as neurosarcoidosis, similar inflammatory myelitis or demyelination or possible cord infarct. Recommendations were made for lumbar puncture however patient refused. Recommendations were made for consideration of followup MRI in the next 1-2 months for comparison. Maintained on subcutaneous Lovenox for DVT prophylaxis. Physical therapy evaluation completed and ongoing recommendations made for physical medicine rehabilitation consult to consider inpatient rehabilitation services. Patient was felt to be a gait candidate for inpatient rehabilitation services and was admitted for comprehensive rehabilitation program. Patient transferred to CIR on 02/13/2012 .    Patient currently requires min with basic self-care skills and IADL secondary to muscle weakness, muscle joint tightness and muscle paralysis and decreased sitting balance, decreased standing balance, decreased postural control, hemiplegia  and decreased balance strategies.  Prior to hospitalization, patient could complete ADLs & IADLs independently.  Patient will benefit from skilled intervention to increase independence with basic self-care skills prior to  discharge home with family.  Anticipate patient will require intermittent supervision and no further OT follow recommended.  OT - End of Session Activity Tolerance: Tolerates 10 - 20 min activity with multiple rests Endurance Deficit: Yes OT Assessment Rehab Potential: Excellent Barriers to Discharge: None (none known at this time) OT Plan OT Intensity: Minimum of 1-2 x/day, 45 to 90 minutes OT Frequency: 5 out of 7 days OT Duration/Estimated Length of Stay: 7-10 days OT Treatment/Interventions: Balance/vestibular training;Community reintegration;Discharge planning;DME/adaptive equipment instruction;Functional electrical stimulation;Functional mobility training;Neuromuscular re-education;Pain management;Patient/family education;Psychosocial support;Self Care/advanced ADL retraining;Skin care/wound managment;Splinting/orthotics;Therapeutic Activities;UE/LE Strength taining/ROM;UE/LE Coordination activities;Wheelchair propulsion/positioning;Therapeutic Exercise OT Recommendation Patient destination: Home Follow Up Recommendations: None Equipment Recommended: 3 in 1 bedside comode  Precautions/Restrictions  Precautions Precautions: Fall Restrictions Weight Bearing Restrictions: No  General Chart Reviewed: Yes  Pain Pain Assessment Pain Assessment: No/denies pain Pain Score: 0-No pain Faces Pain Scale: No hurt  Home Living/Prior Functioning Home Living Lives With: Spouse;Daughter Available Help at Discharge: Available PRN/intermittently (husband and daughter work) Type of Home: House Home Access: Stairs to enter [employer] of Steps: 8 back, 2 in the front Entrance Stairs-Rails: Right;Left;Can reach both (right handrail for 8 in the back, bil handrails on the front) Home Layout: Two level;Able to live on main level with bedroom/bathroom Alternate Level Stairs-Number of Steps:  (sometimes she goes up there to get clothes) Bathroom Shower/Tub: Tub/shower  unit;Curtain Firefighter: Standard Bathroom Accessibility: Yes How Accessible: Accessible via walker Home Adaptive Equipment: None Additional Comments: pt's husband works first shift and daughter works second so she has someone with her nearly all the time IADL History Homemaking Responsibilities: Yes Meal Prep Responsibility: Primary Laundry Responsibility: Primary Cleaning Responsibility: Secondary Shopping Responsibility: Primary Current License: Yes Occupation: On disability Prior Function Level of Independence: Independent with basic ADLs;Independent with homemaking with ambulation;Independent with gait;Independent with transfers (PTA) Able to Take Stairs?: Yes Driving: Yes Vocation: On disability (for CAD) Leisure: Hobbies-yes (Comment) Comments: go shopping, play the lottery  ADL - See FIM  Vision/Perception  Vision - History Baseline Vision: Wears glasses all the time (bifocals) Patient Visual Report: No change from baseline Vision - Assessment Vision Assessment: Vision not tested Perception Perception: Within Functional Limits Praxis Praxis: Intact   Cognition Overall Cognitive Status: Appears within functional limits for tasks assessed Arousal/Alertness: Awake/alert Orientation Level: Oriented X4 Memory: Appears intact Awareness: Appears intact Problem Solving: Appears intact Safety/Judgment: Appears intact  Sensation Sensation Light Touch: Appears Intact Additional Comments: bilateral UEs appear intact Coordination Gross Motor Movements are Fluid and Coordinated: Yes Fine Motor Movements are Fluid and Coordinated: Yes  Mobility  Bed Mobility Supine to Sit: 5: Supervision;HOB flat Sitting - Scoot to Edge of Bed: 5: Supervision Sit to Supine: 5: Supervision;HOB flat Transfers Sit to Stand: 4: Min assist Stand to Sit: 4: Min Hydrologist Sitting - Balance Support: No upper extremity supported;Feet  supported Static Sitting - Level of Assistance: 5: Stand by assistance Static Standing Balance Static Standing - Balance Support: Bilateral upper extremity supported Static Standing - Level of Assistance: 4: Min assist  Extremity/Trunk Assessment RUE Assessment RUE Assessment: Within Functional Limits (can benefit from UE strengthening exercises) LUE Assessment LUE Assessment: Within Functional Limits (can benefit from UE strengthening exercises)  See FIM for current functional status  Refer to  Care Plan for Long Term Goals  Recommendations for other services: None  Discharge Criteria: Patient will be discharged from OT if patient refuses treatment 3 consecutive times without medical reason, if treatment goals not met, if there is a change in medical status, if patient makes no progress towards goals or if patient is discharged from hospital.  The above assessment, treatment plan, treatment alternatives and goals were discussed and mutually agreed upon: by patient  --------------------------------------------------------------------------------------------------  SESSION NOTE  1030-1130 - 60 Minutes Individual Therapy No complaints of pain Initial 1:1 occupational therapy evaluation started. Focused skilled intervention on bed mobility, w/c <-> elevated toilet seat stand pivot transfer using grab bars prn, toileting (peri care & clothing management), ADL retraining at sink level in sit->stand position, sit/stands, dynamic standing balance/tolerance/endurance, education to increase independence with BADLs, w/c management, and overall activity tolerance/endurance. At end of session left patient seated in w/c with bilateral leg rests donned and call bell & phone within reach.   Wendy Hoback 02/14/2012, 12:02 PM

## 2012-02-14 NOTE — Evaluation (Signed)
Physical Therapy Assessment and Plan  Patient Details  Name: Amy Black MRN: [id number] Date of Birth: [birth year]  PT Diagnosis: Abnormality of gait, Difficulty walking and Muscle weakness Rehab Potential: Good ELOS: 7-10 days   Today's Date: 02/14/2012 Time: Session #1/Evaluation: 0802-0900, Session #2: [id number] Time Calculation (min): Session #1/Evaluation: 58 min, Session #2: 48  Problem List:  Patient Active Problem List  Diagnosis  . ADENOCARCINOMA  . ANGINA, UNSTABLE  . CAD  . GERD  . DIVERTICULOSIS, COLON  . IRRITABLE BOWEL SYNDROME  . HEPATIC CYST  . DIARRHEA  . Anxiety  . Chest pain  . Irritable bowel disease  . Dysphagia  . Gastritis  . Duodenal nodule  . Adenocarcinoma of unknown primary  . Flaccid paralysis of legs  . Urinary retention  . Weakness of right leg    Past Medical History:  Past Medical History  Diagnosis Date  . CAD (coronary artery disease)   . Irritable bowel syndrome   . Diverticulosis of colon (without mention of hemorrhage)   . hepatic cyst   . Hyperlipidemia   . 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 HTN (hypertension)     PT. DENIES  . History of cardiac catheterization   . Unstable angina   . Adenocarcinoma     LEFT LEG  . Insomnia   . Vitamin D deficiency   . Adenocarcinoma of unknown primary 02/06/2011   Past Surgical History:  Past Surgical History  Procedure Date  . Resection of leg lesion and radiation 2004  . Diagnostic laparoscopy   . Extensive lysis of adhesions   . ;eft salpingo-oophorectomy   . Abdominal hysterectomy   . Pelvic laparoscopy 1992    RSO, AND LSO ON 2007    Assessment & Plan Clinical Impression: Amy Black is a 72 y.o. right-handed female with history of coronary artery disease, hypertension. Admitted 02/10/2012 with right lower extremity weakness. Patient reported that the symptoms started on Monday, 02/05/2012.  She noted a sudden shooting pain down her right hip that lasted only a  short time and she could not move her right lower extremity. Despite the weakness of right lower extremity she was able to walk but stumbled when doing so. Patient also complained of urinary retention over the past 2-3 months. MRI of lumbar spine showed no acute abnormalities nor fracture or mass. MRI of the pelvis also unremarkable. She did require urinary catheterization in the emergency department and drained for about 1200 cc. Neurology services consulted with workup ongoing. MRI of the brain showed no acute abnormalities. An MRI thoracic spine completed 02/12/2012 showed non-expansile signal abnormality in the right hemi-cord at T11-T12. Conus medullaris appeared to be spared and no other abnormal thoracic spinal cord signal identified. There was question of possible inflammatory process of the cord such as neurosarcoidosis, similar inflammatory myelitis or demyelination or possible cord infarct. Recommendations were made for lumbar puncture however patient refused. Recommendations were made for consideration of followup MRI in the next 1-2 months for comparison.  Patient transferred to CIR on 02/13/2012 .   Patient currently requires min with mobility secondary to muscle weakness and muscle paralysis and impaired timing and sequencing, unbalanced muscle activation and decreased coordination.  Prior to hospitalization, patient was independent  with mobility and lived with Spouse;Daughter in a House home.  Home access is 8 back, 2 in the frontStairs to enter.  Patient will benefit from skilled PT intervention to maximize safe functional mobility, minimize fall risk and decrease caregiver burden for planned  discharge home with intermittent assist.  Anticipate patient will benefit from follow up OP at discharge.  PT - End of Session Activity Tolerance: Tolerates 30+ min activity with multiple rests Endurance Deficit: Yes Endurance Deficit Description: yes, pt reports due to CAD.  She needed a seated rest  break after short distance gait PT Assessment Rehab Potential: Good Barriers to Discharge: Decreased caregiver support (no 24 hour) Barriers to Discharge Comments: no 24 hour support, pt will have to be mod I to go home safely PT Plan PT Intensity: Minimum of 2-3x/day, 45 to 90 minutes PT Frequency: 5 out of 7 days PT Duration Estimated Length of Stay: 7-10 days PT Treatment/Interventions: Ambulation/gait training;Balance/vestibular training;Community reintegration;Discharge planning;DME/adaptive equipment instruction;Functional mobility training;Functional electrical stimulation;Neuromuscular re-education;Patient/family education;Psychosocial support;Stair training;Splinting/orthotics;Therapeutic Activities;Therapeutic Exercise;UE/LE Strength taining/ROM;UE/LE Coordination activities;Wheelchair propulsion/positioning PT Recommendation Recommendations for Other Services: Other (comment) (ortotist consult for bracing of right leg) Follow Up Recommendations: Home health PT Patient destination: Home Equipment Recommended: Rolling walker with 5" wheels;Other (comment) (WC TBD) Equipment Details: she may or may not need WC -possibly for community access only and if so 16 x 16 hemi height with basic cushion and basic leg rests).    PT Evaluation Precautions/Restrictions Precautions Precautions: Fall Restrictions Weight Bearing Restrictions: No General   Vital Signs  Pain Pain Assessment Pain Assessment: No/denies pain Pain Score: 0-No pain Faces Pain Scale: No hurt Home Living/Prior Functioning Home Living Lives With: Spouse;Daughter Available Help at Discharge: Available PRN/intermittently (husband and daughter work) Type of Home: House Home Access: Stairs to enter [employer] of Steps: 8 back, 2 in the front Entrance Stairs-Rails: Right;Left;Can reach both (right handrail for 8 in the back, bil handrails on the front) Home Layout: Two level;Able to live on main level with  bedroom/bathroom Alternate Level Stairs-Number of Steps:  (sometimes she goes up there to get clothes) Bathroom Shower/Tub: Tub/shower unit;Curtain Firefighter: Standard Bathroom Accessibility: Yes How Accessible: Accessible via walker Home Adaptive Equipment: None Additional Comments: pt's husband works first shift and daughter works second so she has someone with her nearly all the time Prior Function Level of Independence: Independent with basic ADLs;Independent with homemaking with ambulation;Independent with gait;Independent with transfers (PTA) Able to Take Stairs?: Yes Driving: Yes Vocation: On disability (for CAD) Leisure: Hobbies-yes (Comment) Comments: go shopping, play the lottery Vision/Perception  Vision - History Baseline Vision: Wears glasses all the time (bifocals) Patient Visual Report: No change from baseline Vision - Assessment Vision Assessment: Vision not tested Perception Perception: Within Functional Limits Praxis Praxis: Intact  Cognition Overall Cognitive Status: Appears within functional limits for tasks assessed Arousal/Alertness: Awake/alert Orientation Level: Oriented X4 Memory: Appears intact Awareness: Appears intact Problem Solving: Appears intact Safety/Judgment: Appears intact Sensation Sensation Light Touch: Appears Intact Additional Comments: bilateral UEs appear intact Coordination Gross Motor Movements are Fluid and Coordinated: Yes Fine Motor Movements are Fluid and Coordinated: Yes Motor     Mobility Bed Mobility Supine to Sit: 5: Supervision;HOB flat Sitting - Scoot to Edge of Bed: 5: Supervision Sit to Supine: 5: Supervision;HOB flat Transfers Sit to Stand: 4: Min assist Stand to Sit: 4: Min assist Locomotion  Ambulation Ambulation: Yes Ambulation/Gait Assistance: 4: Min assist Ambulation Distance (Feet): 50 Feet Assistive device: Rolling walker Ambulation/Gait Assistance Details: Verbal cues for sequencing;Verbal  cues for precautions/safety;Verbal cues for technique Ambulation/Gait Assistance Details: step to gait pattern with cues for TKE during stance and increased hip and knee flexion to clear foot during swing.  Pt watching her feet.   Gait Gait  Pattern: Step-to pattern;Right flexed knee in stance;Right steppage;Right hip hike Stairs / Additional Locomotion Stairs: Yes Stairs Assistance: 3: Mod assist Stairs Assistance Details: Verbal cues for technique;Verbal cues for precautions/safety;Verbal cues for sequencing;Manual facilitation for weight shifting Stairs Assistance Details (indicate cue type and reason): manual assist to block right knee and support trunk while performing stairs.   Stair Management Technique: Two rails;Step to pattern;Forwards Number of Stairs: 3  Height of Stairs: 4  Corporate treasurer: Yes Wheelchair Assistance: 5: Financial planner Details: Verbal cues for Diplomatic Services operational officer: Both upper extremities;Left lower extremity Wheelchair Parts Management: Needs assistance Distance: 100  Trunk/Postural Assessment     Balance Static Sitting Balance Static Sitting - Balance Support: No upper extremity supported;Feet supported Static Sitting - Level of Assistance: 5: Stand by assistance Static Standing Balance Static Standing - Balance Support: Bilateral upper extremity supported Static Standing - Level of Assistance: 4: Min assist Extremity Assessment  RUE Assessment RUE Assessment: Within Functional Limits (can benefit from UE strengthening exercises) LUE Assessment LUE Assessment: Within Functional Limits (can benefit from UE strengthening exercises) RLE Assessment RLE Assessment: Exceptions to [facility] RLE Strength RLE Overall Strength Comments: at rest right foot is inverted and plantarflexed, right leg is internally rotated,  ankle PF 2+/5, DF 0/5, knee extension 2-/5, knee flexion 2-/5, hip flexion 2-/5 LLE  Assessment LLE Assessment: Within Functional Limits  Skilled Interventions:  Session #1: see eval note  Session #2: gait with RW 90' min assist with WC to follow to encourage increased gait distance.  Pt able to manage her R leg progression on her own with increased hip and knee flexion to compensate for right foot drop.  WC mobility 90' with supervision.  Cues for technique turning and backing up due to this is a new task for pt.  Car transfer min assist with verbal cues for safest technique and hand placement.  Nu Step level 3 bil arms and legs for endurance and strength training 10 mins.  Needed loop around her right knee to handlebar to keep right knee in neutral alignment (it internally rotates at the knee).   Bed mobility sit to supine supervision with left leg looping right leg to get it back into bed.  Bed exercises: ankle pumps L AROM, R AAROM x 20 reps, heel slides R AAROM with resisted (leg press) extension, hip abduct R with AAROM, SLR L, bridges bil with therapist's assist to stabilize right knee x 10 each all exercises.    FIM:  FIM - Banker Devices: Therapist, occupational: 5: Supine > Sit: Supervision (verbal cues/safety issues);4: Bed > Chair or W/C: Min A (steadying Pt. > 75%) FIM - Locomotion: Wheelchair Distance: 100 Locomotion: Wheelchair: 5: Travels 150 ft or more: maneuvers on rugs and over door sills with supervision, cueing or coaxing FIM - Locomotion: Ambulation Locomotion: Ambulation Assistive Devices: Designer, industrial/product Ambulation/Gait Assistance: 4: Min assist   Refer to Care Plan for Long Term Goals  Recommendations for other services: None and Other: orthotist consult  Discharge Criteria: Patient will be discharged from PT if patient refuses treatment 3 consecutive times without medical reason, if treatment goals not met, if there is a change in medical status, if patient makes no progress towards goals or if patient is  discharged from hospital.  The above assessment, treatment plan, treatment alternatives and goals were discussed and mutually agreed upon: by patient and by family  Amy Black, PT, DPT [phone]   02/14/2012,  12:09 PM

## 2012-02-14 NOTE — Progress Notes (Signed)
Subjective/Complaints: Couldn't sleep. Restless. Denies pain A 12 point review of systems has been performed and if not noted above is otherwise negative.   Objective: Vital Signs: Blood pressure 110/72, pulse 76, temperature 97.4 F (36.3 C), temperature source Oral, resp. rate 19, SpO2 98.00%. Mr Thoracic Spine W Wo Contrast  02/12/2012  *RADIOLOGY REPORT*  Clinical Data: 72 year old female with severe right lower extremity weakness, difficulty using the right lower extremity, but is able to walk with walker.  Recently unable to void.  Recent episode of back pain radiating to the right buttock.  MRI THORACIC SPINE WITHOUT AND WITH CONTRAST  Technique:  Multiplanar and multiecho pulse sequences of the thoracic spine were obtained without and with intravenous contrast.  Contrast:  15 ml MultiHance.  Comparison: Lumbar MRI without contrast 02/09/2012.  Chest CTs 04/21/2010 and earlier.  Brain MRI 02/11/2012.  Findings: Patchy increased STIR and T2 signal within the lower thoracic spinal cord, with epicenter at the lower T11 vertebral level, and extension across about 1.5 vertebral bodies.  The abnormality is primarily in the right hemi cord, perhaps with only minimal extension into or across the midline along its lower extent.  Following contrast, there is patchy and nodular enhancement of the affected area, but there is no expansion of the spinal cord.  The spinal cord levels above and below the lesion are normal.  The conus medullaris appears be normal and is mostly visualized, terminates below L1.  Normal thoracic vertebral height, alignment, and marrow signal.   Visualized paraspinal soft tissues are within normal limits. Capacious thoracic spinal canal.  No spinal stenosis.  No significant degenerative changes.  Negative visualized thoracic viscera including the signal associated with the visualized thoracic and upper abdominal aorta.  There are multiple small circumscribed T2 hyperintense areas in the  liver which do not appear to enhance, compatible with biliary cysts or hamartomas.  IMPRESSION: Non-expansile signal abnormality in the right hemi cord at T11-T12. Associated patchy enhancement.  Conus medullaris appears to be spared, and no other areas of abnormal thoracic spinal cord signal identified. Case presentation and imaging findings discussed at length with Dr. Thana Farr. While some aspects of the presentation could indicate a cord infarct, the unilateral involvement makes that seem unlikely. At this point I favor an inflammatory process of the cord such as neurosarcoidosis, similar inflammatory myelitis, or demyelination (ADEM, etc). The patient appears to have a remote history of "adenocarcinoma NOS" but with absent mass effect, metastatic disease and/or tumor is unlikely.   Original Report Authenticated By: Erskine Speed, M.D.     Basename 02/13/12 1811  WBC 9.5  HGB 12.5  HCT 37.6  PLT 434*    Basename 02/13/12 1811  NA --  K --  CL --  GLUCOSE --  BUN --  CREATININE 0.85  CALCIUM --   CBG (last 3)  No results found for this basename: GLUCAP:3 in the last 72 hours  Wt Readings from Last 3 Encounters:  02/10/12 63 kg (138 lb 14.2 oz)  01/25/12 72.122 kg (159 lb)  07/26/11 73.029 kg (161 lb)    Physical Exam:  Constitutional: She is oriented to person, place, and time. She appears well-developed.  HENT:  Head: Normocephalic.  Eyes:  Pupils round and reactive to light  Neck: Normal range of motion. Neck supple. No thyromegaly present.  Cardiovascular: Normal rate and regular rhythm.  Pulmonary/Chest: Effort normal and breath sounds normal. She has no wheezes.  Abdominal: Soft. Bowel sounds are normal. She exhibits  no distension. There is no tenderness.  Neurological: She is alert and oriented to person, place, and time.  Follows full commands.  BUE is 4+/5 without sensory findings. R HF 1+2/5. R KE 1+2/5. 0/5 ADF and APF. LLE motor fxn intact. Decreased sensation  to light touch over the L4-S1. Has bowel and bladder sensation. Dtr's 2+ left patella, ankle and 1+ Right LE  Skin: Skin is warm and dry.  Psychiatric: She has a normal mood and affect. Her behavior is normal. Judgment and thought content normal    Assessment/Plan: 1. Functional deficits secondary to likely thoracic myelopathy which require 3+ hours per day of interdisciplinary therapy in a comprehensive inpatient rehab setting. Physiatrist is providing close team supervision and 24 hour management of active medical problems listed below. Physiatrist and rehab team continue to assess barriers to discharge/monitor patient progress toward functional and medical goals. FIM:                   Comprehension Comprehension Mode: Auditory Comprehension: 5-Understands complex 90% of the time/Cues < 10% of the time  Expression Expression Mode: Verbal Expression: 5-Expresses basic 90% of the time/requires cueing < 10% of the time.  Social Interaction Social Interaction: 5-Interacts appropriately 90% of the time - Needs monitoring or encouragement for participation or interaction.  Problem Solving Problem Solving: 5-Solves complex 90% of the time/cues < 10% of the time  Memory Memory: 5-Recognizes or recalls 90% of the time/requires cueing < 10% of the time  Medical Problem List and Plan:  1. Right lower showing weakness  potentially due to thoracic myelopathy  -pt expressed to me that she would consider a lumbar puncture  -follow up MRI recommended as an outpt.  2. DVT Prophylaxis/Anticoagulation: Subcutaneous Lovenox. Monitor platelet counts any signs of bleeding  3. Mood: Xanax as needed.provide emotional support and positive reinforcement  4. Neuropsych: This patient is capable of making decisions on his/her own behalf.  5. urinary retention. Check PVRs x3 and provide as needed. Showing some improvement. Likely neurogenic. Recent ucx negative 6. Hypertension. Maxzide one tablet  every Monday Wednesday Friday, verapamil 80 mg 3 times a day. Monitor the increased mobility  7. CAD with unstable angina. Continue aspirin therapy. Patient no complaints of chest pain or shortness of breath . She does use Nitrostat as needed  8. GERD. Protonix  LOS (Days) 1 A FACE TO FACE EVALUATION WAS PERFORMED  Jalayah Gutridge T 02/14/2012 7:01 AM

## 2012-02-14 NOTE — Progress Notes (Signed)
[facility]patient Rehabilitation Center Individual Statement of Services  Patient Name:  Amy Black  Date:  02/14/2012  Welcome to the Inpatient Rehabilitation Center.  Our goal is to provide you with an individualized program based on your diagnosis and situation, designed to meet your specific needs.  With this comprehensive rehabilitation program, you will be expected to participate in at least 3 hours of rehabilitation therapies Monday-Friday, with modified therapy programming on the weekends.  Your rehabilitation program will include the following services:  Physical Therapy (PT), Occupational Therapy (OT), 24 hour per day rehabilitation nursing, Therapeutic Recreaction (TR), Case Management (Social Worker), Rehabilitation Medicine, Nutrition Services and Pharmacy Services  Weekly team conferences will be held on Tuesdays to discuss your progress.  Your  Social Worker will talk with you frequently to get your input and to update you on team discussions.  Team conferences with you and your family in attendance may also be held.  Expected length of stay: 7-10 days  Overall anticipated outcome: modified independent  Depending on your progress and recovery, your program may change.  Your  Social Worker will coordinate services and will keep you informed of any changes.  Your  Social Worker's name and contact numbers are listed  below.  The following services may also be recommended but are not provided by the Inpatient Rehabilitation Center:   Driving Evaluations  Home Health Rehabiltiation Services  Outpatient Rehabilitatation [facility]  Vocational Rehabilitation   Arrangements will be made to provide these services after discharge if needed.  Arrangements include referral to agencies that provide these services.  Your insurance has been verified to be:  [place] Your primary doctor is:  Dr. Christell Constant  Pertinent information will be shared with your doctor and your insurance company.     Social Worker:  [place], [place] [phone] or ([phone]  Information discussed with and copy given to patient by: Amada Jupiter, 02/14/2012, 2:46 PM

## 2012-02-14 NOTE — Progress Notes (Signed)
Patient information reviewed and entered into eRehab system by Markail Diekman, RN, CRRN, PPS Coordinator.  Information including medical coding and functional independence measure will be reviewed and updated through discharge.     Per nursing patient was given "Data Collection Information Summary for Patients in Inpatient Rehabilitation Facilities with attached "Privacy Act Statement-Health Care Records" upon admission.  

## 2012-02-14 NOTE — Progress Notes (Signed)
Occupational Therapy Note  Patient Details  Name: Amy Black MRN: [id number] Date of Birth: 23-Apr-1940 Today's Date: 02/14/2012  Time: 1300-1325 Pt denies pain Individual Therapy Pt engaged in functional amb with RW for home mgmt tasks in ADL apartment and introduction to kitchen safety.  Discussed DME needs on discharge.  Pt states she does cooking at home.  Practiced retrieving items at various heights while standing at counter.   Lavone Neri [facility] 02/14/2012, 3:15 PM

## 2012-02-14 NOTE — Progress Notes (Signed)
Social Work Social Work Assessment and Plan  Patient Details  Name: Amy Black MRN: [id number] Date of Birth: March 21, 1940  Today's Date: 02/14/2012  Problem List:  Patient Active Problem List  Diagnosis  . ADENOCARCINOMA  . ANGINA, UNSTABLE  . CAD  . GERD  . DIVERTICULOSIS, COLON  . IRRITABLE BOWEL SYNDROME  . HEPATIC CYST  . DIARRHEA  . Anxiety  . Chest pain  . Irritable bowel disease  . Dysphagia  . Gastritis  . Duodenal nodule  . Adenocarcinoma of unknown primary  . Flaccid paralysis of legs  . Urinary retention  . Weakness of right leg   Past Medical History:  Past Medical History  Diagnosis Date  . CAD (coronary artery disease)   . Irritable bowel syndrome   . Diverticulosis of colon (without mention of hemorrhage)   . hepatic cyst   . Hyperlipidemia   . 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 HTN (hypertension)     PT. DENIES  . History of cardiac catheterization   . Unstable angina   . Adenocarcinoma     LEFT LEG  . Insomnia   . Vitamin D deficiency   . Adenocarcinoma of unknown primary 02/06/2011   Past Surgical History:  Past Surgical History  Procedure Date  . Resection of leg lesion and radiation 2004  . Diagnostic laparoscopy   . Extensive lysis of adhesions   . ;eft salpingo-oophorectomy   . Abdominal hysterectomy   . Pelvic laparoscopy 1992    RSO, AND LSO ON 2007   Social History:  reports that she quit smoking about 39 years ago. Her smoking use included Cigarettes. She has never used smokeless tobacco. She reports that she does not drink alcohol or use illicit drugs.  Family / Support Systems Marital Status: Married How Long?: 50 years Patient Roles: Spouse;Parent Spouse/Significant Other: husband, "Juel Burrow" Agan @ (H) [phone] or (C) [phone] Children: daughter, Telecia Larocque (lives in home with pt) 2 (C) [phone] and son, Karren Burly Kofman @ (C) [phone] Anticipated Caregiver: family  Ability/Limitations of Caregiver: Scientist, physiological Caregiver Availability: 24/7 Family Dynamics: Pt describes very supportive family and very willing to provide any assistance needed.  Social History Preferred language: English Religion: Baptist Cultural Background: NA Education: HS Read: Yes Write: Yes Employment Status: Retired Fish farm manager Issues: none Guardian/Conservator: None   Abuse/Neglect Physical Abuse: Denies Verbal Abuse: Denies Sexual Abuse: Denies Exploitation of patient/patient's resources: Denies Self-Neglect: Denies  Emotional Status Pt's affect, behavior adn adjustment status: Pt very pleasant and talkative.  Denies any significant emotional distress. Smiling throughout interview and speaking very positively about her family and their support.   Recent Psychosocial Issues: None Pyschiatric History: None Substance Abuse History: None  Patient / Family Perceptions, Expectations & Goals Pt/Family understanding of illness & functional limitations: Pt reports that doctors are uncertain what the cause of her LE weakness is, however, aware need for CIR to treat the symptoms. Premorbid pt/family roles/activities: Pt independent and active PTA Anticipated changes in roles/activities/participation: None significant as pt's goals have been set for modified independent Pt/family expectations/goals: "Need to build this leg up."  [employer]: None Premorbid Home Care/DME Agencies: None Transportation available at discharge: yes  Discharge Planning Living Arrangements: Spouse/significant other;Children Support Systems: Spouse/significant other;Children Type of Residence: Private residence Insurance Resources: Electrical engineer Resources: Social Security Financial Screen Referred: No Living Expenses: Own Money Management: Spouse Do you have any problems obtaining your medications?: No Home Management: pt and family share responsibilities Patient/Family Preliminary  Plans: Pt plans to return home with her husband and daughter who alternate work hours Social Work Anticipated Follow Up Needs: HH/OP Expected length of stay: 7-10 days  Clinical Impression Very pleasant, talkative, straight forward woman here for unknown LE weakness.  Good family support and anticipate good gains with therapies.  No emotional distress noted, however, will monitor throughout stay. Will assist with d/c needs.  Jabre Heo 02/14/2012, 4:12 PM

## 2012-02-15 ENCOUNTER — Inpatient Hospital Stay (HOSPITAL_COMMUNITY): Payer: Medicare Other

## 2012-02-15 ENCOUNTER — Inpatient Hospital Stay (HOSPITAL_COMMUNITY): Payer: Medicare Other | Admitting: Physical Therapy

## 2012-02-15 LAB — URINALYSIS, ROUTINE W REFLEX MICROSCOPIC
Bilirubin Urine: NEGATIVE
Glucose, UA: NEGATIVE mg/dL
Ketones, ur: NEGATIVE mg/dL
Nitrite: NEGATIVE
Protein, ur: 30 mg/dL — AB
Specific Gravity, Urine: 1.025 (ref 1.005–1.030)
Urobilinogen, UA: 0.2 mg/dL (ref 0.0–1.0)
pH: 6 (ref 5.0–8.0)

## 2012-02-15 LAB — URINE MICROSCOPIC-ADD ON

## 2012-02-15 MED ORDER — TRAZODONE HCL 100 MG PO TABS
100.0000 mg | ORAL_TABLET | Freq: Every day | ORAL | Status: DC
  Administered 2012-02-15: 100 mg via ORAL
  Filled 2012-02-15 (×2): qty 1

## 2012-02-15 NOTE — Progress Notes (Signed)
Patient complaining of a "racing heart" and shortness of breath.  She states that she has been experiencing these symptoms occasionally for the past 30 years.  She states that there are no precipitating factors to the episodes and she states she feels better after resting.  BP 100/56, HR 100, O2 sat 100% on room air.  Patient denies dizziness.  Richardson Chiquito, PA notified.  No new orders received.  Will continue to monitor.

## 2012-02-15 NOTE — Progress Notes (Signed)
Occupational Therapy Session Note  Patient Details  Name: Amy Black MRN: [id number] Date of Birth: July 26, 1940  Today's Date: 02/15/2012  Sessio 1 Time: 0800-0850 Time Calculation (min): 50 min  Short Term Goals: Week 1:  OT Short Term Goal 1 (Week 1): Short Term Goals =  Long Term Goals secondary to ELOS  Skilled Therapeutic Interventions/Progress Updates:    Pt in bed upon arrival resting.  Pt amb with RW to closet to select clothing prior to bathing and dressing w/c level with sit<>stand.  Pt required steady A when standing.  Pt transitioned to gym to engage in dynamic standing activities with focus on maintaining balance while engaging BUE in functional tasks.  Pt utilized table to lean against while standing.  Pt required min verbal cues for safety awareness with sit<>stand and when backing up with RW.  Pt c/o "racing heart" after standing and requested to rest.  RN notified, vitals recorded, and pt transferred to recliner to rest.  Therapy Documentation Precautions:  Precautions Precautions: Fall Precaution Comments: RLE weakness Restrictions Weight Bearing Restrictions: No General: General Amount of Missed OT Time (min): 10 Minutes Missed Time Reason: Other (comment) (pt c/o "racing" heart and needed to rest; RN aware) Pain: Pain Assessment Pain Assessment: No/denies pain  See FIM for current functional status  Therapy/Group: Individual Therapy  Session 2 Pt missed 45 mins skilled ot services.  Pt stated she "didn't feel well enough" to participate in therapy.  RN notified.  Lavone Neri [facility] 02/15/2012, 8:56 AM

## 2012-02-15 NOTE — Progress Notes (Signed)
Subjective/Complaints: Didn't sleep again. Urinary frequency. Urine with order A 12 point review of systems has been performed and if not noted above is otherwise negative.   Objective: Vital Signs: Blood pressure 112/77, pulse 77, temperature 97.5 F (36.4 C), temperature source Oral, resp. rate 17, weight 67.541 kg (148 lb 14.4 oz), SpO2 100.00%. No results found.  Basename 02/14/12 0610 02/13/12 1811  WBC 8.4 9.5  HGB 12.1 12.5  HCT 37.0 37.6  PLT 425* 434*    Basename 02/14/12 0610 02/13/12 1811  NA 135 --  K 4.4 --  CL 97 --  GLUCOSE 100* --  BUN 17 --  CREATININE 0.84 0.85  CALCIUM 9.7 --   CBG (last 3)  No results found for this basename: GLUCAP:3 in the last 72 hours  Wt Readings from Last 3 Encounters:  02/14/12 67.541 kg (148 lb 14.4 oz)  02/10/12 63 kg (138 lb 14.2 oz)  01/25/12 72.122 kg (159 lb)    Physical Exam:  Constitutional: She is oriented to person, place, and time. She appears well-developed.  HENT:  Head: Normocephalic.  Eyes:  Pupils round and reactive to light  Neck: Normal range of motion. Neck supple. No thyromegaly present.  Cardiovascular: Normal rate and regular rhythm.  Pulmonary/Chest: Effort normal and breath sounds normal. She has no wheezes.  Abdominal: Soft. Bowel sounds are normal. She exhibits no distension. There is no tenderness.  Neurological: She is alert and oriented to person, place, and time.  Follows full commands.  BUE is 4+/5 without sensory findings. R HF still 1+2/5. R KE 1+2/5. 0/5 ADF and APF. LLE motor fxn intact. Decreased sensation to light touch over the L4-S1 on the right. Has bowel and bladder sensation. Dtr's 2+ left patella, ankle and 1+ Right LE  Skin: Skin is warm and dry.  Psychiatric: She has a normal mood and affect. Her behavior is normal. Judgment and thought content normal    Assessment/Plan: 1. Functional deficits secondary to thoracic right-hemi myelopathy which require 3+ hours per day of  interdisciplinary therapy in a comprehensive inpatient rehab setting. Physiatrist is providing close team supervision and 24 hour management of active medical problems listed below. Physiatrist and rehab team continue to assess barriers to discharge/monitor patient progress toward functional and medical goals. FIM: FIM - Bathing Bathing Steps Patient Completed: Chest;Right Arm;Left Arm;Abdomen;Front perineal area;Buttocks;Right upper leg;Left upper leg Bathing: 4: Min-Patient completes 8-9 55f 10 parts or 75+ percent (and steady assist while standing)  FIM - Upper Body Dressing/Undressing Upper body dressing/undressing steps patient completed: Thread/unthread right sleeve of pullover shirt/dresss;Thread/unthread left sleeve of pullover shirt/dress;Put head through opening of pull over shirt/dress;Pull shirt over trunk;Thread/unthread right sleeve of front closure shirt/dress;Thread/unthread left sleeve of front closure shirt/dress;Pull shirt around back of front closure shirt/dress;Button/unbutton shirt Upper body dressing/undressing: 5: Supervision: Safety issues/verbal cues FIM - Lower Body Dressing/Undressing Lower body dressing/undressing steps patient completed: Thread/unthread right underwear leg;Thread/unthread left underwear leg;Pull underwear up/down;Thread/unthread right pants leg;Thread/unthread left pants leg;Pull pants up/down;Don/Doff right sock;Don/Doff left sock Lower body dressing/undressing: 4: Steadying Assist  FIM - Toileting Toileting steps completed by patient: Adjust clothing prior to toileting;Performs perineal hygiene;Adjust clothing after toileting Toileting: 4: Steadying assist  FIM - Diplomatic Services operational officer Devices: Elevated toilet seat;Grab bars Toilet Transfers: 4-To toilet/BSC: Min A (steadying Pt. > 75%);4-From toilet/BSC: Min A (steadying Pt. > 75%)  FIM - Bed/Chair Transfer Bed/Chair Transfer Assistive Devices: Therapist, occupational:  5: Supine > Sit: Supervision (verbal cues/safety issues);4: Bed > Chair or  W/C: Min A (steadying Pt. > 75%)  FIM - Locomotion: Wheelchair Distance: 100 Locomotion: Wheelchair: 5: Travels 150 ft or more: maneuvers on rugs and over door sills with supervision, cueing or coaxing FIM - Locomotion: Ambulation Locomotion: Ambulation Assistive Devices: Designer, industrial/product Ambulation/Gait Assistance: 4: Min assist  Comprehension Comprehension Mode: Auditory Comprehension: 6-Follows complex conversation/direction: With extra time/assistive device  Expression Expression Mode: Verbal Expression: 6-Expresses complex ideas: With extra time/assistive device  Social Interaction Social Interaction: 6-Interacts appropriately with others with medication or extra time (anti-anxiety, antidepressant).  Problem Solving Problem Solving: 6-Solves complex problems: With extra time  Memory Memory: 6-More than reasonable amt of time  Medical Problem List and Plan:  1. Right lower showing weakness   due to thoracic right-hemi myelopathy  -pt expressed to me that she would consider a lumbar puncture  -follow up MRI recommended as an outpt.  2. DVT Prophylaxis/Anticoagulation: Subcutaneous Lovenox. Monitor platelet counts any signs of bleeding  3. Mood: Xanax as needed.provide emotional support and positive reinforcement  4. Neuropsych: This patient is capable of making decisions on his/her own behalf.  5. urinary retention. -this has improved. Now having urinary frequency  And urgency and urine with odor  -check ua and cx 6. Hypertension. Maxzide one tablet every Monday Wednesday Friday, verapamil 80 mg 3 times a day. Monitor the increased mobility  7. CAD with unstable angina. Continue aspirin therapy. Patient no complaints of chest pain or shortness of breath . She does use Nitrostat as needed  8. GERD. Protonix  LOS (Days) 2 A FACE TO FACE EVALUATION WAS PERFORMED  Amy Black T 02/15/2012 6:28 AM

## 2012-02-15 NOTE — Progress Notes (Signed)
Physical Therapy Session Note  Patient Details  Name: Amy Black MRN: [id number] Date of Birth: [birth year]  Today's Date: 02/15/2012 Time: Session #1: [id number]. Session #2:  11:31-12:11 Time Calculation (min): Session #1: 53 min. Session #2: 40 min   Short Term Goals: Week 1:  PT Short Term Goal 1 (Week 1): STgs=LTGs  Skilled Therapeutic Interventions/Progress Updates:    Session #1: Pt reports she is having heart "palpitations" and wants to limit her treatment this AM. O2 sats 98% on RA and HR 116 at rest in the recliner.  She reports she had to stop with OT and rest in the chair because of the palpitations as well.  Agreeable to chair exercises: ankle pumps, SLR , hip abduction, heel slides, glute sets, quad sets, SAQs, hip adduct against pillow, (rest break to check vitals: 96% O2, 94 HR), LAQs, hip flexion (marching). WC mobility 150' with bil upper extremities supervision ,cues for safety and technique when navigating obstacles.  Upper extremity stretches arms overhead, lateral lean and rotation of trunk.  Exercises with green t-band seated: shoulder flexion, abduction, horizontal adduction, chest press, elbow flexion, lat pull down, rows bil x 10 reps each.  Pt did not feel up for gait or NuStep right now. Transfers into and out of WC from recliner and into bed min guard assist for safety and minimal balance.  No assistive device and WC close.    Session #2: Pt continues to not feel well this PM.  VSS HR 94 at rest O2 sats 98% on RA.  WC mobility on both tiled and carpeted surfaces 90' supervision.  Standing working on standing tolerance with RW and upright posture as well as TKE x 3 mins before pt fatigued and needed to rest.  Sit to stand transitions min assist with verbal cues for safety (pt often wants to sit before chair is directly behind her).  Pt wanted to try NuStep again this session although she did not feel up for gait due to "palpitations".  NuStep level 3 x 10 mins bil upper  and lower extremities with right leg looped with green t-band to handle bar to keep her knee from internally rotating while using NuStep. Transfers into and out of WC no assistive device min assist to both right and left side. Verbal cues for safety.      Therapy Documentation Precautions:  Precautions Precautions: Fall Precaution Comments: RLE weakness Restrictions Weight Bearing Restrictions: No General: Missed Time Reason: Other (comment) (pt c/o "racing" heart and needed to rest; RN aware) Vital Signs: Therapy Vitals Pulse Rate: 116  (at rest in recliner chair) BP: 100/56 mmHg Pain: Pain Assessment Pain Assessment: 0-10 Pain Score:   5 Pain Type: Acute pain Pain Location: Abdomen Pain Intervention(s): Repositioned;Ambulation/increased activity;Refused (refused pain meds)  See FIM for current functional status  Therapy/Group: Individual Therapy  Lurena Joiner B. Brookie Wayment, PT, DPT [phone]   02/15/2012, 10:29 AM

## 2012-02-16 ENCOUNTER — Ambulatory Visit (HOSPITAL_COMMUNITY): Payer: Medicare Other

## 2012-02-16 ENCOUNTER — Inpatient Hospital Stay (HOSPITAL_COMMUNITY): Payer: Medicare Other

## 2012-02-16 ENCOUNTER — Encounter (HOSPITAL_COMMUNITY): Payer: Medicare Other | Admitting: Occupational Therapy

## 2012-02-16 ENCOUNTER — Inpatient Hospital Stay (HOSPITAL_COMMUNITY): Payer: Medicare Other | Admitting: Occupational Therapy

## 2012-02-16 MED ORDER — TRIAMTERENE-HCTZ 37.5-25 MG PO CAPS: 1.0000 | ORAL_CAPSULE | ORAL | Status: DC

## 2012-02-16 MED ORDER — CIPROFLOXACIN HCL 250 MG PO TABS
250.0000 mg | ORAL_TABLET | Freq: Two times a day (BID) | ORAL | Status: DC
  Administered 2012-02-16: 250 mg via ORAL
  Filled 2012-02-16 (×3): qty 1

## 2012-02-16 MED ORDER — LORATADINE 10 MG PO TABS: 10.0000 mg | ORAL_TABLET | Freq: Every day | ORAL | Status: DC

## 2012-02-16 MED ORDER — ALPRAZOLAM 0.5 MG PO TABS: 0.5000 mg | ORAL_TABLET | Freq: Three times a day (TID) | ORAL | Status: DC

## 2012-02-16 MED ORDER — ASPIRIN 81 MG PO TABS: 81.0000 mg | ORAL_TABLET | Freq: Every day | ORAL | Status: DC

## 2012-02-16 MED ORDER — VERAPAMIL HCL 80 MG PO TABS: 80.0000 mg | ORAL_TABLET | Freq: Three times a day (TID) | ORAL | Status: DC

## 2012-02-16 MED ORDER — CIPROFLOXACIN HCL 250 MG PO TABS: 250.0000 mg | ORAL_TABLET | Freq: Two times a day (BID) | ORAL | Status: DC

## 2012-02-16 NOTE — Progress Notes (Signed)
Patient discharge to home, left with son at 46.  Discharge instruction given by Harvel Ricks, PA. Patient received morning medications prior to discharge.  Patient escorted off unit by inpatient rehab NT.

## 2012-02-16 NOTE — Progress Notes (Signed)
Subjective/Complaints: Wants to go home. Feels she's going "backwards" A 12 point review of systems has been performed and if not noted above is otherwise negative.   Objective: Vital Signs: Blood pressure 111/70, pulse 93, temperature 98.2 F (36.8 C), temperature source Oral, resp. rate 20, weight 67.541 kg (148 lb 14.4 oz), SpO2 99.00%. No results found.  Basename 02/14/12 0610 02/13/12 1811  WBC 8.4 9.5  HGB 12.1 12.5  HCT 37.0 37.6  PLT 425* 434*    Basename 02/14/12 0610 02/13/12 1811  NA 135 --  K 4.4 --  CL 97 --  GLUCOSE 100* --  BUN 17 --  CREATININE 0.84 0.85  CALCIUM 9.7 --   CBG (last 3)  No results found for this basename: GLUCAP:3 in the last 72 hours  Wt Readings from Last 3 Encounters:  02/14/12 67.541 kg (148 lb 14.4 oz)  02/10/12 63 kg (138 lb 14.2 oz)  01/25/12 72.122 kg (159 lb)    Physical Exam:  Constitutional: She is oriented to person, place, and time. She appears well-developed.  HENT:  Head: Normocephalic.  Eyes:  Pupils round and reactive to light  Neck: Normal range of motion. Neck supple. No thyromegaly present.  Cardiovascular: Normal rate and regular rhythm.  Pulmonary/Chest: Effort normal and breath sounds normal. She has no wheezes.  Abdominal: Soft. Bowel sounds are normal. She exhibits no distension. There is no tenderness.  Neurological: She is alert and oriented to person, place, and time.  Follows full commands.  BUE is 4+/5 without sensory findings. R HF still 1+2/5. R KE 1+2/5. 0/5 ADF and APF. LLE motor fxn intact. Decreased sensation to light touch over the L4-S1 on the right. Has bowel and bladder sensation. Dtr's 2+ left patella, ankle and 1+ Right LE  Skin: Skin is warm and dry.  Psychiatric: She has a normal mood and affect. Her behavior is normal. Judgment and thought content normal    Assessment/Plan: 1. Functional deficits secondary to thoracic right-hemi myelopathy which require 3+ hours per day of  interdisciplinary therapy in a comprehensive inpatient rehab setting. Physiatrist is providing close team supervision and 24 hour management of active medical problems listed below. Physiatrist and rehab team continue to assess barriers to discharge/monitor patient progress toward functional and medical goals.  Despite needing continued assistance the patient and son wish to go home. Apparently there are things going home which are impacting decision which she would not discuss. She also feels her sleep is worse as well as her bladder fucntion  Set up for outpt therapies. outpt thoracic mri, follow up with me in a month. Lumbar puncture was again recommended but the patient refuses.   FIM: FIM - Bathing Bathing Steps Patient Completed: Chest;Right Arm;Left Arm;Front perineal area;Abdomen;Buttocks;Right upper leg;Left upper leg;Left lower leg (including foot);Right lower leg (including foot) Bathing: 4: Steadying assist  FIM - Upper Body Dressing/Undressing Upper body dressing/undressing steps patient completed: Thread/unthread right bra strap;Thread/unthread left bra strap;Hook/unhook bra;Thread/unthread right sleeve of pullover shirt/dresss;Thread/unthread left sleeve of pullover shirt/dress;Put head through opening of pull over shirt/dress;Pull shirt over trunk;Thread/unthread right sleeve of front closure shirt/dress;Thread/unthread left sleeve of front closure shirt/dress;Pull shirt around back of front closure shirt/dress;Button/unbutton shirt Upper body dressing/undressing: 5: Supervision: Safety issues/verbal cues FIM - Lower Body Dressing/Undressing Lower body dressing/undressing steps patient completed: Thread/unthread right underwear leg;Thread/unthread left underwear leg;Pull underwear up/down;Thread/unthread left pants leg;Thread/unthread right pants leg;Pull pants up/down;Don/Doff right shoe;Don/Doff left shoe;Fasten/unfasten right shoe;Fasten/unfasten left shoe Lower body  dressing/undressing: 4: Steadying Assist  FIM -  Toileting Toileting steps completed by patient: Adjust clothing prior to toileting;Performs perineal hygiene;Adjust clothing after toileting Toileting Assistive Devices: Grab bar or rail for support Toileting: 4: Steadying assist  FIM - Diplomatic Services operational officer Devices: Elevated toilet seat;Grab bars Toilet Transfers: 4-To toilet/BSC: Min A (steadying Pt. > 75%);4-From toilet/BSC: Min A (steadying Pt. > 75%)  FIM - Bed/Chair Transfer Bed/Chair Transfer Assistive Devices: Bed rails;Arm rests Bed/Chair Transfer: 5: Sit > Supine: Supervision (verbal cues/safety issues);4: Bed > Chair or W/C: Min A (steadying Pt. > 75%);4: Chair or W/C > Bed: Min A (steadying Pt. > 75%)  FIM - Locomotion: Wheelchair Distance: 150 Locomotion: Wheelchair: 5: Travels 150 ft or more: maneuvers on rugs and over door sills with supervision, cueing or coaxing FIM - Locomotion: Ambulation Locomotion: Ambulation Assistive Devices: Designer, industrial/product Ambulation/Gait Assistance: 4: Min assist Locomotion: Ambulation: 0: Activity did not occur (pt did not feel well enough today to walk)  Comprehension Comprehension Mode: Auditory Comprehension: 5-Understands complex 90% of the time/Cues < 10% of the time  Expression Expression Mode: Verbal Expression: 5-Expresses complex 90% of the time/cues < 10% of the time  Social Interaction Social Interaction: 6-Interacts appropriately with others with medication or extra time (anti-anxiety, antidepressant).  Problem Solving Problem Solving: 5-Solves complex 90% of the time/cues < 10% of the time  Memory Memory: 5-Recognizes or recalls 90% of the time/requires cueing < 10% of the time  Medical Problem List and Plan:  1. Right lower showing weakness   due to thoracic right-hemi myelopathy  -pt expressed to me that she would consider a lumbar puncture  -follow up MRI recommended as an outpt.  2. DVT  Prophylaxis/Anticoagulation: Subcutaneous Lovenox. Monitor platelet counts any signs of bleeding  3. Mood: Xanax as needed.provide emotional support and positive reinforcement  4. Neuropsych: This patient is capable of making decisions on his/her own behalf.  5. urinary retention. -this has improved. Now having urinary frequency  And urgency and urine with odor  -urine with 100k GNR--begin empiric cipro 6. Hypertension. Maxzide one tablet every Monday Wednesday Friday, verapamil 80 mg 3 times a day. Monitor the increased mobility  7. CAD with unstable angina. Continue aspirin therapy. Patient no complaints of chest pain or shortness of breath . She does use Nitrostat as needed  8. GERD. Protonix  LOS (Days) 3 A FACE TO FACE EVALUATION WAS PERFORMED  [name],ZACHARY T 02/16/2012 8:23 AM

## 2012-02-16 NOTE — Discharge Summary (Signed)
  Discharge summary job [id number]

## 2012-02-16 NOTE — Plan of Care (Signed)
Problem: RH BLADDER ELIMINATION Goal: RH STG MANAGE BLADDER WITH EQUIPMENT WITH ASSISTANCE STG Manage Bladder With Equipment With Assistance Min  Outcome: Not Met (add Reason) Patient choosing to discharge earlier than the targeted date.

## 2012-02-16 NOTE — Progress Notes (Signed)
Social Work Print production planner Note Discharge Note  The overall goal for the admission was met for:   Discharge location: Yes - home with family to assist  Length of Stay: No - pt choosing to d/c earlier than recommended (7-10 days) - LOS = 3 days  Discharge activity level: No - minimal assistance (goals were for mod i )  Home/community participation: No  Services provided included: MD, RD, PT, OT, RN, TR, Pharmacy and SW  Financial Services: Medicare **[employer] Medicare  Follow-up services arranged: Outpatient: PT via [facility] and Patient/Family has no preference for HH/DME agencies  Comments (or additional information):  Patient/Family verbalized understanding of follow-up arrangements: Yes  Individual responsible for coordination of the follow-up plan: patient  Confirmed correct DME delivered: NA  Appollonia Klee

## 2012-02-16 NOTE — Plan of Care (Signed)
Problem: RH SAFETY Goal: RH STG ADHERE TO SAFETY PRECAUTIONS W/ASSISTANCE/DEVICE STG Adhere to Safety Precautions With Assistance/Device. Supervision  Outcome: Not Met (add Reason) Patient choosing to discharge earlier than targeted date (7-10 days)

## 2012-02-16 NOTE — Discharge Summary (Signed)
[name], [name] NO.:  [id number]  MEDICAL RECORD NO.:  [id number]  LOCATION:  4151                         FACILITY:  [facility]  PHYSICIAN:  Amy Black, M.D.DATE OF BIRTH:  17-Jan-1941  DATE OF ADMISSION:  02/13/2012 DATE OF DISCHARGE:  02/16/2012                              DISCHARGE SUMMARY   DISCHARGE DIAGNOSES: 1. Right lower extremity weakness with thoracic right hemi-myelopathy. 2. Subcutaneous Lovenox for deep venous thrombosis prophylaxis. 3. Gram-negative rod urinary tract infection. 4. Hypertension. 5. Coronary artery disease.  A 72 year old right-handed female with history of coronary artery disease admitted on February 20, 2012, with right lower extremity weakness.  The patient reports that the symptoms started on Monday, February 05, 2012, while she was a passenger in a car while with her daughter.  She noted a sudden shooting pain down her right hip that lasted only a short time.  She could not move her right lower extremity. Despite the weakness, she was able to walk, but stumbled when doing so. The patient also complained of urinary retention over the past 2-3 months.  MRI of the lumbar spine showed no acute abnormalities nor fracture or mass.  MRI of the pelvis unremarkable.  She did require catheterization to empty her bladder for 1200 mL.  Neurology Services consulted.  MRI of the brain showed no acute abnormalities.  MRI thoracic spine showed non-expansile signal abnormality in the right hemi cord at T11-12.  Conus medullaris appeared to be spared and no other abnormal signals identified.  There was question of possible inflammatory process of the cord such as  neurosarcoidosis, myelitis, or demyelination or possible cord infarction.  Recommendations were made for lumbar puncture, but the patient refused.  Therapies were ongoing with recommendations for followup MRI in 1-2 months.  She was admitted for comprehensive rehab  program.  PAST MEDICAL HISTORY:  See discharge diagnoses.  SOCIAL HISTORY:  Lives with her daughter in 1-level home.  FUNCTIONAL HISTORY:  Prior to admission was independent.  FUNCTIONAL STATUS:  Upon admission to rehab services was moderate assist to ambulate 45 feet with a rolling walker.  PHYSICAL EXAMINATION:  VITAL SIGNS:  Blood pressure 100/56, pulse 80, respirations 18, temperature 97.6. GENERAL:  This was an alert female, oriented x3. HEENT:  Pupils round and reactive to light. LUNGS:  Clear to auscultation. CARDIAC:  Regular rate and rhythm. ABDOMEN:  Soft, nontender.  Good bowel sounds. MUSCULOSKELETAL:  Right hip flexors 1-2/5.  Right knee extension 1-2/5, 0/5 ankle dorsiflexion and plantar flexion.  REHABILITATION HOSPITAL COURSE:  The patient was admitted to inpatient rehab services with therapies initiated on a 3-hour daily basis consisting of physical therapy, occupational therapy, and rehabilitation nursing.  The following issues were addressed during the patient's rehabilitation stay.  Pertaining to Ms. Dobbins' right lower extremity weakness of question etiology, thoracic right hemi-myelopathy workup thus far essentially unremarkable.  There was plan for followup MRI as an outpatient.  There was recommendations of lumbar puncture by Neurology Services the patient had refused.  Subcutaneous Lovenox ongoing for DVT prophylaxis.  Hospital course with gram-negative rod urinary tract infection.  Placed on Cipro.  Her Foley catheter tube had been removed.  Blood  pressure was monitored on Maxzide as well as verapamil.  She will continue aspirin therapy for history of coronary artery disease.  Therapies were initiated with physical and occupational therapy.  The patient with progressive gains ambulating short distances with an assistive device.  The patient remained quite adamant about being discharged to home.  All recommendations were discussed with family in regards  to her safety and ongoing therapies.  Family teaching was to be completed and discharged to home on February 16, 2012, with a followup MRI planned on March 01, 2012.  She would see Amy Black at the outpatient rehab service office.  DISCHARGE MEDICATIONS: 1. Aspirin 81 mg daily. 2. Cipro 250 mg b.i.d. x7 days. 3. Claritin 10 mg daily. 4. Maxzide 1 tablet every Monday, Wednesday, and Friday. 5. Verapamil 80 mg t.i.d.  DIET:  Regular.  SPECIAL INSTRUCTIONS:  The patient would follow up Dr. Thana Black at Outpatient Neurology, her primary MD appointment to be made, Amy Black on March 06, 2012.     Amy Black, P.A.   ______________________________ Amy Black, M.D.    DA/MEDQ  D:  02/16/2012  T:  02/16/2012  Job:  [id number]  cc:   Amy Black, M.D. Amy Farr, MD Amy Lofts, MD Amy Black, M.D.

## 2012-02-16 NOTE — Progress Notes (Signed)
Physical Therapy Discharge Summary  Patient Details  Name: Amy Black MRN: [id number] Date of Birth: 1940-08-09  Today's Date: 02/16/2012 (based on treatment sessions 02-15-12) Time: 1000-     Patient has met 1 of 11 long term goals due to improved activity tolerance, improved balance, increased strength, ability to compensate for deficits and functional use of  right lower extremity.  Patient to discharge at a wheelchair level Min Assist.   Patient's care partner unavailable to provide the necessary physical assistance at discharge.  Reasons goals not met: Pt did not complete rehab program and elected to leave early.    Recommendation:  Patient will benefit from ongoing skilled PT services in home health setting to continue to advance safe functional mobility, address ongoing impairments in right leg strength, gait, balance, activity tolerance, and minimize fall risk.  Equipment: RW  Reasons for discharge: Discharged from hospital-pt elected not to complete the rehab program and be discharged home with family's assist.    Patient/family agrees with progress made and goals achieved: No-no family or patient available-this note is based on her last therapy session 02-15-12  PT Discharge Precautions/Restrictions Precautions Precautions: Fall Precaution Comments: RLE weakness   Pain Pain Assessment Pain Assessment: No/denies pain Vision/Perception  Vision - History Baseline Vision: Wears glasses all the time Patient Visual Report: No change from baseline Vision - Assessment Vision Assessment: Vision not tested Perception Perception: Within Functional Limits Praxis Praxis: Intact  Cognition Overall Cognitive Status: Appears within functional limits for tasks assessed Arousal/Alertness: Awake/alert Orientation Level: Oriented X4 Sensation Sensation Light Touch: Appears Intact Hot/Cold: Appears Intact Coordination Gross Motor Movements are Fluid and Coordinated: Yes Fine  Motor Movements are Fluid and Coordinated: Yes    Mobility Bed Mobility Supine to Sit: 6: Modified independent (Device/Increase time);With rails Sitting - Scoot to Edge of Bed: 6: Modified independent (Device/Increase time);With rail Sit to Supine: 6: Modified independent (Device/Increase time);With rail;HOB flat Transfers Sit to Stand: 4: Min assist Stand to Sit: 4: Min assist Stand Pivot Transfers: 4: Min assist Stand Pivot Transfer Details (indicate cue type and reason): min assist both with and without RW Locomotion  Ambulation Ambulation: No (activity did not occur on 02/15/12) Stairs / Additional Locomotion Stairs: No Wheelchair Mobility Wheelchair Mobility: Yes Wheelchair Assistance: 5: Investment banker, operational: Both upper extremities Wheelchair Parts Management: Supervision/cueing Distance: 150  Trunk/Postural Assessment  Cervical Assessment Cervical Assessment: Within Lobbyist Thoracic Assessment: Within Functional Limits Lumbar Assessment Lumbar Assessment: Within Functional Limits Postural Control Postural Control: Within Functional Limits  Balance Static Sitting Balance Static Sitting - Balance Support: No upper extremity supported;Feet supported Static Sitting - Level of Assistance: 7: Independent Static Standing Balance Static Standing - Balance Support: Bilateral upper extremity supported Static Standing - Level of Assistance: 5: Stand by assistance Extremity Assessment  RUE Assessment RUE Assessment: Within Functional Limits LUE Assessment LUE Assessment: Within Functional Limits RLE Assessment RLE Assessment: Exceptions to [facility] RLE Strength RLE Overall Strength Comments: decreased overall with ankle most affected.  1/4 ankle DF, 2/5 PF, knee 2+/5, hip 2+/5 LLE Assessment LLE Assessment: Within Functional Limits  See FIM for current functional status  Lurena Joiner B. Maika Mcelveen, PT, DPT [phone]   02/16/2012, 12:21  PM

## 2012-02-16 NOTE — Progress Notes (Signed)
Occupational Therapy Discharge Summary  Patient Details  Name: Amy Black MRN: [id number] Date of Birth: 05-07-1940  Today's Date: 02/16/2012 Time:  -     Patient has met 4 of 11 long term goals due to improved activity tolerance and improved balance.  Pt made limited progress this admission secondary to pt's decision to discharge home without completion of her rehab program.  Pt is supervision/steady A for BADLs and requires verbal cues for safety awareness.  Education was not completed secondary to patients early discharge. No family has been present for therapy. Patient to discharge at overall Supervision level.  Patient's care partner unavailable for education , unable to comment regarding ability to provide the necessary physical assistance at discharge.    Reasons goals not met: Early discharge from rehab program per patients request  Equipment: No equipment provided  Reasons for discharge: discharge from hospital  Patient/family agrees with progress made and goals achieved: No  OT Discharge   Pain Pain Assessment Pain Assessment: No/denies pain ADL ADL Grooming: Independent Where Assessed-Grooming: Sitting at sink Upper Body Bathing: Modified independent Where Assessed-Upper Body Bathing: Sitting at sink Lower Body Bathing: Contact guard Where Assessed-Lower Body Bathing: Standing at sink Upper Body Dressing: Independent Where Assessed-Upper Body Dressing: Sitting at sink Lower Body Dressing: Contact guard Where Assessed-Lower Body Dressing: Standing at sink Vision/Perception  Vision - History Baseline Vision: Wears glasses all the time Patient Visual Report: No change from baseline Vision - Assessment Vision Assessment: Vision not tested Perception Perception: Within Functional Limits Praxis Praxis: Intact  Cognition Overall Cognitive Status: Appears within functional limits for tasks assessed Arousal/Alertness: Awake/alert Orientation Level: Oriented  X4 Sensation Sensation Light Touch: Appears Intact Hot/Cold: Appears Intact Coordination Gross Motor Movements are Fluid and Coordinated: Yes Fine Motor Movements are Fluid and Coordinated: Yes    Trunk/Postural Assessment  Cervical Assessment Cervical Assessment: Within Functional Limits Thoracic Assessment Thoracic Assessment: Within Functional Limits Lumbar Assessment Lumbar Assessment: Within Functional Limits Postural Control Postural Control: Within Functional Limits  Balance Static Sitting Balance Static Sitting - Level of Assistance: 7: Independent Extremity/Trunk Assessment RUE Assessment RUE Assessment: Within Functional Limits LUE Assessment LUE Assessment: Within Functional Limits  See FIM for current functional status  Rich Brave 02/16/2012, 10:46 AM

## 2012-02-17 LAB — URINE CULTURE: Colony Count: >=100000

## 2012-02-20 DIAGNOSIS — R29898 Other symptoms and signs involving the musculoskeletal system: Secondary | ICD-10-CM | POA: Insufficient documentation

## 2012-02-20 DIAGNOSIS — R339 Retention of urine, unspecified: Secondary | ICD-10-CM | POA: Insufficient documentation

## 2012-02-22 ENCOUNTER — Other Ambulatory Visit: Payer: Self-pay | Admitting: Gynecology

## 2012-02-22 DIAGNOSIS — Z1231 Encounter for screening mammogram for malignant neoplasm of breast: Secondary | ICD-10-CM

## 2012-03-01 ENCOUNTER — Ambulatory Visit (HOSPITAL_COMMUNITY): Admit: 2012-03-01 | Payer: Medicare Other

## 2012-03-06 ENCOUNTER — Inpatient Hospital Stay: Payer: Medicare Other | Admitting: Physical Medicine & Rehabilitation

## 2012-03-19 ENCOUNTER — Ambulatory Visit: Payer: Medicare Other

## 2012-04-05 ENCOUNTER — Ambulatory Visit
Admission: RE | Admit: 2012-04-05 | Discharge: 2012-04-05 | Disposition: A | Discharge status: Home / Self Care | Payer: Medicare Other | Source: Ambulatory Visit | Attending: Gynecology | Admitting: Gynecology

## 2012-04-05 DIAGNOSIS — Z1231 Encounter for screening mammogram for malignant neoplasm of breast: Secondary | ICD-10-CM

## 2012-04-22 ENCOUNTER — Ambulatory Visit: Payer: Medicare Other | Attending: Neurology | Admitting: Physical Therapy

## 2012-04-22 DIAGNOSIS — R269 Unspecified abnormalities of gait and mobility: Secondary | ICD-10-CM | POA: Insufficient documentation

## 2012-04-22 DIAGNOSIS — R5381 Other malaise: Secondary | ICD-10-CM | POA: Insufficient documentation

## 2012-04-22 DIAGNOSIS — IMO0001 Reserved for inherently not codable concepts without codable children: Secondary | ICD-10-CM | POA: Insufficient documentation

## 2012-04-24 ENCOUNTER — Ambulatory Visit: Payer: Medicare Other | Admitting: Physical Therapy

## 2012-04-29 ENCOUNTER — Ambulatory Visit: Payer: Medicare Other | Admitting: *Deleted

## 2012-05-01 ENCOUNTER — Ambulatory Visit: Payer: Medicare Other | Admitting: *Deleted

## 2012-05-02 ENCOUNTER — Ambulatory Visit: Payer: Medicare Other | Admitting: Gastroenterology

## 2012-05-02 ENCOUNTER — Telehealth: Payer: Self-pay | Admitting: *Deleted

## 2012-05-02 NOTE — Telephone Encounter (Signed)
Pt needs refill for xanax. Directions do not match chart. Chart sent to your desk for review. Thanks.

## 2012-05-02 NOTE — Telephone Encounter (Signed)
Pt needs xanax refill. Directions do not match chart. Chart sent to desk for review. thanks

## 2012-05-03 ENCOUNTER — Other Ambulatory Visit: Payer: Self-pay | Admitting: Nurse Practitioner

## 2012-05-03 MED ORDER — ALPRAZOLAM 0.5 MG PO TABS: 0.5000 mg | ORAL_TABLET | Freq: Three times a day (TID) | ORAL | Status: DC

## 2012-05-06 ENCOUNTER — Ambulatory Visit: Payer: Medicare Other | Admitting: Physical Therapy

## 2012-05-06 NOTE — Telephone Encounter (Signed)
Completed.

## 2012-05-10 ENCOUNTER — Ambulatory Visit: Payer: Medicare Other | Attending: Neurology | Admitting: Physical Therapy

## 2012-05-10 DIAGNOSIS — IMO0001 Reserved for inherently not codable concepts without codable children: Secondary | ICD-10-CM | POA: Insufficient documentation

## 2012-05-10 DIAGNOSIS — R5381 Other malaise: Secondary | ICD-10-CM | POA: Insufficient documentation

## 2012-05-10 DIAGNOSIS — R269 Unspecified abnormalities of gait and mobility: Secondary | ICD-10-CM | POA: Insufficient documentation

## 2012-05-13 ENCOUNTER — Ambulatory Visit: Payer: Medicare Other | Admitting: Physical Therapy

## 2012-05-17 ENCOUNTER — Ambulatory Visit: Payer: Medicare Other | Admitting: Physical Therapy

## 2012-05-17 ENCOUNTER — Ambulatory Visit: Payer: Self-pay | Admitting: Family Medicine

## 2012-05-20 ENCOUNTER — Ambulatory Visit: Payer: Medicare Other | Admitting: Physical Therapy

## 2012-05-23 ENCOUNTER — Ambulatory Visit: Payer: Medicare Other | Admitting: Physical Therapy

## 2012-05-27 ENCOUNTER — Ambulatory Visit: Payer: Medicare Other | Admitting: *Deleted

## 2012-05-28 ENCOUNTER — Ambulatory Visit (INDEPENDENT_AMBULATORY_CARE_PROVIDER_SITE_OTHER): Payer: Medicare Other | Admitting: Family Medicine

## 2012-05-28 ENCOUNTER — Encounter: Payer: Self-pay | Admitting: Family Medicine

## 2012-05-28 VITALS — BP 117/69 | HR 71 | Temp 97.0°F | Wt 159.4 lb

## 2012-05-28 DIAGNOSIS — I2510 Atherosclerotic heart disease of native coronary artery without angina pectoris: Secondary | ICD-10-CM

## 2012-05-28 DIAGNOSIS — H109 Unspecified conjunctivitis: Secondary | ICD-10-CM

## 2012-05-28 DIAGNOSIS — K219 Gastro-esophageal reflux disease without esophagitis: Secondary | ICD-10-CM

## 2012-05-28 DIAGNOSIS — R29898 Other symptoms and signs involving the musculoskeletal system: Secondary | ICD-10-CM

## 2012-05-28 MED ORDER — SULFACETAMIDE SODIUM 10 % OP SOLN: 1.0000 [drp] | OPHTHALMIC | Status: DC

## 2012-05-28 MED ORDER — ALPRAZOLAM 0.5 MG PO TABS: 0.5000 mg | ORAL_TABLET | Freq: Three times a day (TID) | ORAL | Status: DC

## 2012-05-28 MED ORDER — TRIAMTERENE-HCTZ 37.5-25 MG PO CAPS: 1.0000 | ORAL_CAPSULE | ORAL | Status: DC

## 2012-05-28 NOTE — Progress Notes (Signed)
Patient ID: Amy Black, female   DOB: 08/04/1940, 72 y.o.   MRN: [id number] SUBJECTIVE: HPI: Conjunctivitis Patient presents for evaluation of discharge, itching and tearing in the right eye. She has noticed the above symptoms for 2 days.  Onset was acute. Patient denies blurred vision, pain, photophobia and visual field deficit. There is a history of allergies. Also has had evaluation Neurologist evaluation for varied neurologic symptoms. Suspicious for MS. Was not satisfied with the visits with the Neurologist in [place], so she made an appointment with [facility]. The appointment is May 15th.  Has an appointment to see Urologist for urinary problems.Marland Kitchen  PMH/PSH: reviewed/updated in Epic  SH/FH: reviewed/updated in Epic  Allergies: reviewed/updated in Epic  Medications: reviewed/updated in Epic  Immunizations: reviewed/updated in Epic  ROS: As above in the HPI. All other systems are stable or negative.  OBJECTIVE: APPEARANCE: AA Female Patient in no acute distress.The patient appeared well nourished and normally developed. Acyanotic. Waist: VITAL SIGNS:BP 117/69  Pulse 71  Temp(Src) 97 F (36.1 C) (Oral)  Wt 159 lb 6.4 oz (72.303 kg)  BMI 28.24 kg/m2   SKIN: warm and  Dry without overt rashes, tattoos and scars  HEAD and Neck: without JVD, Head and scalp: normal Eyes:No scleral icterus. Right eye injected with discharge Ears: Auricle normal, canal normal, Tympanic membranes normal, insufflation normal. Nose: normal Throat: normal Neck & thyroid: normal  CHEST & LUNGS: Chest wall: normal Lungs: Clear  CVS: Reveals the PMI to be normally located. Regular rhythm, First and Second Heart sounds are normal,  absence of murmurs, rubs or gallops. Peripheral vasculature: Radial pulses: normal Dorsal pedis pulses: normal Posterior pulses: normal  ABDOMEN:  Appearance: normal Benign,, no organomegaly, no masses, no Abdominal Aortic enlargement. No  Guarding , no rebound. No Bruits. Bowel sounds: normal  RECTAL: N/A GU: N/A  EXTREMETIES: nonedematous. Both Femoral and Pedal pulses are normal.  MUSCULOSKELETAL:  Spine: normal Joints: intact  NEUROLOGIC: oriented to time,place and person; nonfocal. Strength is4/5 right lower extremity Sensory is normal Reflexes are diminished Cranial Nerves are normal.  ASSESSMENT: Conjunctivitis  CAD - Plan: COMPLETE METABOLIC PANEL WITH GFR, NMR Lipoprofile with Lipids  GERD  Weakness of right leg  Suspect that patient may have Multiple Sclerosis. Agree with seeing Neurology at [facility].  PLAN: Orders Placed This Encounter  Procedures  . COMPLETE METABOLIC PANEL WITH GFR  . NMR Lipoprofile with Lipids   No results found for this or any previous visit (from the past 24 hour(s)). Meds ordered this encounter  Medications  . ALPRAZolam (XANAX) 0.5 MG tablet    Sig: Take 1 tablet (0.5 mg total) by mouth 3 (three) times daily.    Dispense:  90 tablet    Refill:  0    Order Specific Question:  Supervising Provider    Answer:  Faith Rogue T [[id number]]  . triamterene-hydrochlorothiazide (DYAZIDE) 37.5-25 MG per capsule    Sig: Take 1 each (1 capsule total) by mouth every Monday, Wednesday, and Friday.    Dispense:  30 capsule    Refill:  11    Order Specific Question:  Supervising Provider    Answer:  Faith Rogue T [[id number]]  . sulfacetamide (BLEPH-10) 10 % ophthalmic solution    Sig: Place 1 drop into the right eye every 4 (four) hours.    Dispense:  15 mL    Refill:  0       Dr Woodroe Mode Recommendations  Diet and Exercise discussed with patient.  For nutrition information, I recommend books:  1).Eat to Live by Dr Monico Hoar. 2).Prevent and Reverse Heart Disease by Dr Suzzette Righter.  Exercise recommendations are:  If unable to walk, then the patient can exercise in a chair 3 times a day. By flapping arms like a bird gently and raising legs outwards to the  front.  If ambulatory, the patient can go for walks for 30 minutes 3 times a week. Then increase the intensity and duration as tolerated.  Goal is to try to attain exercise frequency to 5 times a week.  If applicable: Best to perform resistance exercises (machines or weights) 2 days a week and cardio type exercises 3 days per week.  Discussed diet and exercise.  RTC 3 months.  Candyce Gambino P. Modesto Charon, M.D.

## 2012-05-28 NOTE — Patient Instructions (Addendum)
      Dr Jibril Mcminn's Recommendations  Diet and Exercise discussed with patient.  For nutrition information, I recommend books:  1).Eat to Live by Dr Joel Fuhrman. 2).Prevent and Reverse Heart Disease by Dr Caldwell Esselstyn.  Exercise recommendations are:  If unable to walk, then the patient can exercise in a chair 3 times a day. By flapping arms like a bird gently and raising legs outwards to the front.  If ambulatory, the patient can go for walks for 30 minutes 3 times a week. Then increase the intensity and duration as tolerated.  Goal is to try to attain exercise frequency to 5 times a week.  If applicable: Best to perform resistance exercises (machines or weights) 2 days a week and cardio type exercises 3 days per week.  

## 2012-05-29 LAB — COMPLETE METABOLIC PANEL WITH GFR
ALT: <8 U/L (ref 0–35)
AST: 12 U/L (ref 0–37)
Albumin: 4.1 g/dL (ref 3.5–5.2)
Alkaline Phosphatase: 74 U/L (ref 39–117)
BUN: 16 mg/dL (ref 6–23)
CO2: 31 mEq/L (ref 19–32)
Calcium: 10.2 mg/dL (ref 8.4–10.5)
Chloride: 102 mEq/L (ref 96–112)
Creat: 0.93 mg/dL (ref 0.50–1.10)
GFR, Est African American: 73 mL/min
GFR, Est Non African American: 63 mL/min
Glucose, Bld: 92 mg/dL (ref 70–99)
Potassium: 4.9 mEq/L (ref 3.5–5.3)
Sodium: 139 mEq/L (ref 135–145)
Total Bilirubin: 0.4 mg/dL (ref 0.3–1.2)
Total Protein: 7.1 g/dL (ref 6.0–8.3)

## 2012-05-29 LAB — NMR LIPOPROFILE WITH LIPIDS
Cholesterol, Total: 215 mg/dL — ABNORMAL HIGH (ref <200)
HDL Particle Number: 44.5 umol/L (ref >=30.5)
HDL Size: 9.7 nm (ref >=9.2)
HDL-C: 81 mg/dL (ref >=40)
LDL (calc): 111 mg/dL — ABNORMAL HIGH (ref <100)
LDL Particle Number: 1256 nmol/L — ABNORMAL HIGH (ref <1000)
LDL Size: 21.4 nm (ref >20.5)
LP-IR Score: <25 (ref <=45)
Large HDL-P: 16.4 umol/L (ref >=4.8)
Large VLDL-P: 2.1 nmol/L (ref <=2.7)
Small LDL Particle Number: 403 nmol/L (ref <=527)
Triglycerides: 113 mg/dL (ref <150)
VLDL Size: 46 nm (ref <=46.6)

## 2012-05-31 ENCOUNTER — Encounter: Payer: Medicare Other | Admitting: Physical Therapy

## 2012-06-02 ENCOUNTER — Other Ambulatory Visit: Payer: Self-pay | Admitting: Family Medicine

## 2012-06-02 DIAGNOSIS — I2510 Atherosclerotic heart disease of native coronary artery without angina pectoris: Secondary | ICD-10-CM

## 2012-06-02 MED ORDER — ATORVASTATIN CALCIUM 10 MG PO TABS: 10.0000 mg | ORAL_TABLET | Freq: Every day | ORAL | Status: DC

## 2012-06-03 ENCOUNTER — Telehealth: Payer: Self-pay | Admitting: Family Medicine

## 2012-06-03 NOTE — Telephone Encounter (Signed)
Notified patient of lab results 

## 2012-06-05 ENCOUNTER — Encounter: Payer: Medicare Other | Admitting: Physical Therapy

## 2012-06-06 ENCOUNTER — Ambulatory Visit: Payer: Medicare Other | Attending: Neurology | Admitting: Physical Therapy

## 2012-06-06 DIAGNOSIS — R269 Unspecified abnormalities of gait and mobility: Secondary | ICD-10-CM | POA: Insufficient documentation

## 2012-06-06 DIAGNOSIS — IMO0001 Reserved for inherently not codable concepts without codable children: Secondary | ICD-10-CM | POA: Insufficient documentation

## 2012-06-06 DIAGNOSIS — R5381 Other malaise: Secondary | ICD-10-CM | POA: Insufficient documentation

## 2012-06-07 ENCOUNTER — Other Ambulatory Visit: Payer: Self-pay

## 2012-06-07 DIAGNOSIS — N2889 Other specified disorders of kidney and ureter: Secondary | ICD-10-CM

## 2012-06-10 ENCOUNTER — Encounter: Payer: Medicare Other | Admitting: Physical Therapy

## 2012-06-13 ENCOUNTER — Ambulatory Visit (HOSPITAL_COMMUNITY)
Admission: RE | Admit: 2012-06-13 | Discharge: 2012-06-13 | Disposition: A | Discharge status: Home / Self Care | Payer: Medicare Other | Source: Ambulatory Visit | Attending: Nurse Practitioner | Admitting: Nurse Practitioner

## 2012-06-13 DIAGNOSIS — N281 Cyst of kidney, acquired: Secondary | ICD-10-CM | POA: Insufficient documentation

## 2012-06-13 DIAGNOSIS — K7689 Other specified diseases of liver: Secondary | ICD-10-CM | POA: Insufficient documentation

## 2012-06-13 DIAGNOSIS — N2889 Other specified disorders of kidney and ureter: Secondary | ICD-10-CM

## 2012-06-13 DIAGNOSIS — K838 Other specified diseases of biliary tract: Secondary | ICD-10-CM | POA: Insufficient documentation

## 2012-06-14 ENCOUNTER — Encounter: Payer: Medicare Other | Admitting: Physical Therapy

## 2012-06-20 ENCOUNTER — Other Ambulatory Visit (HOSPITAL_COMMUNITY): Payer: Medicare Other

## 2012-06-20 ENCOUNTER — Telehealth: Payer: Self-pay | Admitting: *Deleted

## 2012-06-20 NOTE — Telephone Encounter (Signed)
PLEASE ADVISE.

## 2012-06-20 NOTE — Telephone Encounter (Signed)
I thought she had an appointment for [facility] already for a second opinion.?

## 2012-06-21 ENCOUNTER — Telehealth: Payer: Self-pay | Admitting: *Deleted

## 2012-06-21 NOTE — Telephone Encounter (Signed)
Patient states she does not need a referral at this time

## 2012-06-22 ENCOUNTER — Telehealth: Payer: Self-pay | Admitting: Family Medicine

## 2012-06-22 NOTE — Telephone Encounter (Signed)
Patient has been seen by neurology and suspected MS. And told me she had appointment to [facility]. Why does she want to get referral to [facility]? She already established with [facility] neurology.

## 2012-06-22 NOTE — Telephone Encounter (Signed)
Please advise 

## 2012-06-24 ENCOUNTER — Other Ambulatory Visit: Payer: Self-pay | Admitting: Family Medicine

## 2012-06-24 DIAGNOSIS — R531 Weakness: Secondary | ICD-10-CM

## 2012-06-24 DIAGNOSIS — G629 Polyneuropathy, unspecified: Secondary | ICD-10-CM

## 2012-06-24 NOTE — Telephone Encounter (Signed)
Referral made to neurology. 

## 2012-06-24 NOTE — Telephone Encounter (Signed)
Patient thought that she had an appt scheduled with [facility]. When she arrived for the appt she though she had they advised her that she was not scheduled and had no record of an appt being scheduled for her. She would like for [place] to refer her for a second opinion now.

## 2012-06-25 NOTE — Telephone Encounter (Signed)
Pt aware by VM that referral was sent to Debbi to work on- per [employer]

## 2012-07-07 HISTORY — PX: TRANSTHORACIC ECHOCARDIOGRAM: SHX275

## 2012-07-12 ENCOUNTER — Other Ambulatory Visit: Payer: Self-pay | Admitting: Oncology

## 2012-07-16 ENCOUNTER — Other Ambulatory Visit: Payer: Self-pay | Admitting: Family Medicine

## 2012-07-18 ENCOUNTER — Other Ambulatory Visit: Payer: Self-pay | Admitting: Family Medicine

## 2012-07-18 NOTE — Telephone Encounter (Signed)
Has appt with you on 08/29/12, last filled 05/28/12, chart sent back. If approved have GINA H call into [facility] at [phone]

## 2012-07-19 ENCOUNTER — Other Ambulatory Visit (HOSPITAL_COMMUNITY): Payer: Self-pay | Admitting: Cardiovascular Disease

## 2012-07-19 ENCOUNTER — Ambulatory Visit (HOSPITAL_COMMUNITY)
Admission: RE | Admit: 2012-07-19 | Discharge: 2012-07-19 | Disposition: A | Discharge status: Home / Self Care | Payer: Medicare Other | Source: Ambulatory Visit | Attending: Cardiovascular Disease | Admitting: Cardiovascular Disease

## 2012-07-19 DIAGNOSIS — R011 Cardiac murmur, unspecified: Secondary | ICD-10-CM | POA: Insufficient documentation

## 2012-07-19 NOTE — Progress Notes (Signed)
[facility]   2D echo completed 07/19/2012.   Cindy Lazar Tierce, RDCS  

## 2012-07-25 ENCOUNTER — Ambulatory Visit (HOSPITAL_BASED_OUTPATIENT_CLINIC_OR_DEPARTMENT_OTHER): Payer: Medicare Other | Admitting: Hematology & Oncology

## 2012-07-25 ENCOUNTER — Encounter: Payer: Self-pay | Admitting: Cardiovascular Disease

## 2012-07-25 ENCOUNTER — Other Ambulatory Visit (HOSPITAL_BASED_OUTPATIENT_CLINIC_OR_DEPARTMENT_OTHER): Payer: Medicare Other | Admitting: Lab

## 2012-07-25 VITALS — BP 117/69 | HR 66 | Temp 97.6°F | Resp 16 | Ht 63.0 in | Wt 158.0 lb

## 2012-07-25 DIAGNOSIS — C801 Malignant (primary) neoplasm, unspecified: Secondary | ICD-10-CM

## 2012-07-25 LAB — COMPREHENSIVE METABOLIC PANEL
ALT: 8 U/L (ref 0–35)
AST: 13 U/L (ref 0–37)
Albumin: 4.1 g/dL (ref 3.5–5.2)
Alkaline Phosphatase: 80 U/L (ref 39–117)
BUN: 10 mg/dL (ref 6–23)
CO2: 29 mEq/L (ref 19–32)
Calcium: 10 mg/dL (ref 8.4–10.5)
Chloride: 100 mEq/L (ref 96–112)
Creatinine, Ser: 1.17 mg/dL — ABNORMAL HIGH (ref 0.50–1.10)
Glucose, Bld: 81 mg/dL (ref 70–99)
Potassium: 4.4 mEq/L (ref 3.5–5.3)
Sodium: 137 mEq/L (ref 135–145)
Total Bilirubin: 0.4 mg/dL (ref 0.3–1.2)
Total Protein: 7.1 g/dL (ref 6.0–8.3)

## 2012-07-25 LAB — LACTATE DEHYDROGENASE: LDH: 130 U/L (ref 94–250)

## 2012-07-25 LAB — CBC WITH DIFFERENTIAL (CANCER CENTER ONLY)
BASO#: 0 10*3/uL (ref 0.0–0.2)
BASO%: 0.6 % (ref 0.0–2.0)
EOS%: 3.7 % (ref 0.0–7.0)
Eosinophils Absolute: 0.2 10*3/uL (ref 0.0–0.5)
HCT: 34.5 % — ABNORMAL LOW (ref 34.8–46.6)
HGB: 11.2 g/dL — ABNORMAL LOW (ref 11.6–15.9)
LYMPH#: 2.7 10*3/uL (ref 0.9–3.3)
LYMPH%: 43.6 % (ref 14.0–48.0)
MCH: 30.4 pg (ref 26.0–34.0)
MCHC: 32.5 g/dL (ref 32.0–36.0)
MCV: 94 fL (ref 81–101)
MONO#: 0.6 10*3/uL (ref 0.1–0.9)
MONO%: 8.9 % (ref 0.0–13.0)
NEUT#: 2.7 10*3/uL (ref 1.5–6.5)
NEUT%: 43.2 % (ref 39.6–80.0)
Platelets: 422 10*3/uL — ABNORMAL HIGH (ref 145–400)
RBC: 3.68 10*6/uL — ABNORMAL LOW (ref 3.70–5.32)
RDW: 14 % (ref 11.1–15.7)
WBC: 6.3 10*3/uL (ref 3.9–10.0)

## 2012-07-25 NOTE — Progress Notes (Signed)
This office note has been dictated.

## 2012-07-26 NOTE — Progress Notes (Signed)
CC:   Francis P. Modesto Charon, M.D. Richard A. Alanda Amass, M.D. Juan H. Lily Peer, M.D. Vania Rea. Jarold Motto, MD, Clementeen Graham, FACP, FAGA  DIAGNOSIS:  Adenosquamous carcinoma of unknown primary, clinical remission x10 years.  CURRENT THERAPY:  Observation.  INTERIM HISTORY:  Ms. Kaner comes in for followup.  We saw her a year ago.  She has been doing okay.  Apparently there is some "right renal mass" that is being looked into.  She had a renal ultrasound done back in May.  This showed a 1.3 x 1.5 cm simple cyst in the right kidney.  No solid mass was noted.  She is doing well otherwise.  She has had no complaints since we last saw her.  She has had no cough or shortness of breath.  There is no nausea or vomiting.  There is no fatigue or weakness.  There is no headache.  She has had no change in bowel or bladder habits.  PHYSICAL EXAMINATION:  General:  This is a petite black female in no obvious distress.  Vital signs:  Show temperature 97.6, pulse 66, respiratory rate 16, blood pressure 117/69.  Weight is 158.  Head and neck:  Shows a normocephalic, atraumatic skull.  There are no ocular or oral lesions.  There are no palpable cervical or supraclavicular lymph nodes.  Lungs:  Clear bilaterally.  Cardiac:  Regular rate and rhythm with a normal S1, S2.  There are no murmurs, rubs or bruits.  Abdomen: Soft with good bowel sounds.  There is no palpable abdominal mass. There is no palpable hepatosplenomegaly.  Back:  No tenderness of the spine, ribs or hips.  Extremities:  Show no clubbing, cyanosis or edema. Neurological:  Shows no focal neurological deficits.  LABORATORY STUDIES:  White cell count 6.3, hemoglobin 11.2, hematocrit 34.5, platelet count 422.  BUN is 10, creatinine 1.17.  Calcium is 10.2 with an albumin of 4.1.  LDH is 130.  IMPRESSION:  Ms. Gehrig is a 72 year old African American female with an unknown primary.  She basically presented with a lesion on her right lower leg  previously resected.  She had radiation therapy.  We have never found the primary site.  We will go ahead and plan to get her back in another 6 months.  I do not see that any additional scans need to be done on her.    ______________________________ Josph Macho, M.D. PRE/MEDQ  D:  07/25/2012  T:  07/26/2012  Job:  [id number]

## 2012-07-27 ENCOUNTER — Other Ambulatory Visit: Payer: Self-pay | Admitting: Gastroenterology

## 2012-08-26 ENCOUNTER — Other Ambulatory Visit: Payer: Self-pay | Admitting: Family Medicine

## 2012-08-28 NOTE — Telephone Encounter (Signed)
Please call in Rx.   Thanks.

## 2012-08-28 NOTE — Telephone Encounter (Signed)
Med called to pharm 

## 2012-08-28 NOTE — Telephone Encounter (Signed)
LAST RF 07/18/12. LAST OV 05/28/12. CALL IN [facility] IF APPROVED [phone].

## 2012-08-29 ENCOUNTER — Ambulatory Visit (INDEPENDENT_AMBULATORY_CARE_PROVIDER_SITE_OTHER): Payer: Medicare Other | Admitting: Family Medicine

## 2012-08-29 ENCOUNTER — Encounter: Payer: Self-pay | Admitting: Family Medicine

## 2012-08-29 VITALS — BP 127/76 | HR 65 | Temp 97.0°F | Wt 157.8 lb

## 2012-08-29 DIAGNOSIS — K589 Irritable bowel syndrome without diarrhea: Secondary | ICD-10-CM

## 2012-08-29 DIAGNOSIS — F411 Generalized anxiety disorder: Secondary | ICD-10-CM

## 2012-08-29 DIAGNOSIS — M47812 Spondylosis without myelopathy or radiculopathy, cervical region: Secondary | ICD-10-CM | POA: Insufficient documentation

## 2012-08-29 DIAGNOSIS — R29898 Other symptoms and signs involving the musculoskeletal system: Secondary | ICD-10-CM

## 2012-08-29 DIAGNOSIS — M549 Dorsalgia, unspecified: Secondary | ICD-10-CM

## 2012-08-29 DIAGNOSIS — I10 Essential (primary) hypertension: Secondary | ICD-10-CM | POA: Insufficient documentation

## 2012-08-29 DIAGNOSIS — K219 Gastro-esophageal reflux disease without esophagitis: Secondary | ICD-10-CM

## 2012-08-29 DIAGNOSIS — I2510 Atherosclerotic heart disease of native coronary artery without angina pectoris: Secondary | ICD-10-CM | POA: Insufficient documentation

## 2012-08-29 DIAGNOSIS — E785 Hyperlipidemia, unspecified: Secondary | ICD-10-CM | POA: Insufficient documentation

## 2012-08-29 MED ORDER — TRAMADOL HCL 50 MG PO TABS: 50.0000 mg | ORAL_TABLET | ORAL | Status: DC | PRN

## 2012-08-29 MED ORDER — ALPRAZOLAM 0.5 MG PO TABS: ORAL_TABLET | ORAL | Status: DC

## 2012-08-29 NOTE — Progress Notes (Signed)
Patient ID: Amy Black, female   DOB: Jan 25, 1941, 72 y.o.   MRN: [id number] SUBJECTIVE: CC: Chief Complaint  Patient presents with  . Follow-up    3 month  no complainjts  . Medication Refill    refill calan and xanax    HPI: Patient is here for follow up of hyperlipidemia/hHTN/CAD/Flaccid paralysis of the right leg. denies Headache;denies Chest Pain;denies weakness;denies Shortness of Breath and orthopnea;denies Visual changes;denies palpitations;denies cough;denies pedal edema;denies symptoms of TIA or stroke;deniesClaudication symptoms. admits to Compliance with medications; denies Problems with medications.  No change neurologically. New problem is recurring pain in the neck on rotation. She had a recent MRI of the neck. Findings only of bulging discs.  Needs meds refilled.  Past Medical History  Diagnosis Date  . CAD (coronary artery disease)   . Irritable bowel syndrome   . Diverticulosis of colon (without mention of hemorrhage)   . hepatic cyst   . Hyperlipidemia   . Adenosquamous carcinoma   . HTN (hypertension)     PT. DENIES  . History of cardiac catheterization   . Unstable angina   . Adenocarcinoma     LEFT LEG  . Insomnia   . Vitamin D deficiency   . Adenocarcinoma of unknown primary 02/06/2011   Past Surgical History  Procedure Laterality Date  . Resection of leg lesion and radiation  2004  . Diagnostic laparoscopy    . Extensive lysis of adhesions    . ;eft salpingo-oophorectomy    . Abdominal hysterectomy    . Pelvic laparoscopy  1992    RSO, AND LSO ON 2007   History   Social History  . Marital Status: Married    Spouse Name: N/A    Number of Children: N/A  . Years of Education: N/A   Occupational History  . Disabilty    Social History Main Topics  . Smoking status: Former Smoker    Types: Cigarettes    Quit date: 11/20/1972  . Smokeless tobacco: Never Used  . Alcohol Use: No  . Drug Use: No  . Sexually Active: No   Other Topics  Concern  . Not on file   Social History Narrative  . No narrative on file   Family History  Problem Relation Age of Onset  . Diabetes      Multiple family members on both sides   . Coronary artery disease      Multiple family members on both sides   . Cancer Mother     Bladder  . Heart disease Mother   . Hypertension Mother   . Colon cancer Neg Hx   . Heart disease Father    Current Outpatient Prescriptions on File Prior to Visit  Medication Sig Dispense Refill  . aspirin 81 MG tablet Take 1 tablet (81 mg total) by mouth daily.  30 tablet    . NITROSTAT 0.4 MG SL tablet Place 0.4 mg under the tongue every 5 (five) minutes as needed. For chest pain      . pantoprazole (PROTONIX) 40 MG tablet Take 40 mg by mouth 2 (two) times daily.       Marland Kitchen sulfacetamide (BLEPH-10) 10 % ophthalmic solution Place 1 drop into the right eye every 4 (four) hours.  15 mL  0  . triamterene-hydrochlorothiazide (DYAZIDE) 37.5-25 MG per capsule Take 1 each (1 capsule total) by mouth every Monday, Wednesday, and Friday.  30 capsule  11  . verapamil (CALAN) 80 MG tablet Take 1 tablet (80 mg  total) by mouth 4 (four) times daily - after meals and at bedtime.  90 tablet  0   No current facility-administered medications on file prior to visit.   Allergies  Allergen Reactions  . Iodine     Itching    Immunization History  Administered Date(s) Administered  . Pneumococcal Polysaccharide 02/07/2000  . Tdap 02/06/2002  . Zoster 02/06/2010   Prior to Admission medications   Medication Sig Start Date End Date Taking? Authorizing Provider  ALPRAZolam Prudy Feeler) 0.5 MG tablet TAKE ONE TABLET BY MOUTH THREE TIMES DAILY 08/29/12  Yes Ileana Ladd, MD  aspirin 81 MG tablet Take 1 tablet (81 mg total) by mouth daily. 02/16/12  Yes Daniel J Angiulli, PA-C  atorvastatin (LIPITOR) 10 MG tablet Take 10 mg by mouth daily.   Yes Historical Provider, MD  baclofen (LIORESAL) 10 MG tablet  07/27/12  Yes Historical Provider, MD   NITROSTAT 0.4 MG SL tablet Place 0.4 mg under the tongue every 5 (five) minutes as needed. For chest pain 11/22/11  Yes Historical Provider, MD  pantoprazole (PROTONIX) 40 MG tablet Take 40 mg by mouth 2 (two) times daily.  05/15/12  Yes Historical Provider, MD  sulfacetamide (BLEPH-10) 10 % ophthalmic solution Place 1 drop into the right eye every 4 (four) hours. 05/28/12  Yes Ileana Ladd, MD  traMADol (ULTRAM) 50 MG tablet Take 1 tablet (50 mg total) by mouth as needed. 08/29/12  Yes Ileana Ladd, MD  triamterene-hydrochlorothiazide (DYAZIDE) 37.5-25 MG per capsule Take 1 each (1 capsule total) by mouth every Monday, Wednesday, and Friday. 05/28/12  Yes Ileana Ladd, MD  verapamil (CALAN) 80 MG tablet Take 1 tablet (80 mg total) by mouth 4 (four) times daily - after meals and at bedtime. 02/16/12  Yes Daniel J Angiulli, PA-C     ROS: As above in the HPI. All other systems are stable or negative.  OBJECTIVE: APPEARANCE:  Patient in no acute distress.The patient appeared well nourished and normally developed. Acyanotic. Waist: VITAL SIGNS:BP 127/76  Pulse 65  Temp(Src) 97 F (36.1 C)  Wt 157 lb 12.8 oz (71.578 kg)  BMI 27.96 kg/m2 AAF Walks with a cane and limps due to the weakness of the right lower extremity.   SKIN: warm and  Dry without overt rashes, tattoos and scars  HEAD and Neck: without JVD,reduced ROM of the neck at extremes. Head and scalp: normal Eyes:No scleral icterus. Fundi normal, eye movements normal. Ears: Auricle normal, canal normal, Tympanic membranes normal, insufflation normal. Nose: normal Throat: normal Neck & thyroid: normal  CHEST & LUNGS: Chest wall: normal Lungs: Clear  CVS: Reveals the PMI to be normally located. Regular rhythm, First and Second Heart sounds are normal,  absence of murmurs, rubs or gallops.  ABDOMEN:  Appearance: normal Benign, no organomegaly, no masses, no Abdominal Aortic enlargement. No Guarding , no rebound. No  Bruits. Bowel sounds: normal  RECTAL: N/A GU: N/A  EXTREMETIES: nonedematous.  MUSCULOSKELETAL:  Spine:neck reduced ROM   NEUROLOGIC: oriented to time,place and person; no changes Strength is abnormal Right lower extremity with paresis Sensory is abnormal Reflexes are abnormal, right lower extremity Cranial Nerves are normal.  Results for orders placed in visit on 07/25/12  CBC WITH DIFFERENTIAL ([facility])      Result Value Range   WBC 6.3  3.9 - 10.0 10e3/uL   RBC 3.68 (*) 3.70 - 5.32 10e6/uL   HGB 11.2 (*) 11.6 - 15.9 g/dL   HCT 16.1 (*)  34.8 - 46.6 %   MCV 94  81 - 101 fL   MCH 30.4  26.0 - 34.0 pg   MCHC 32.5  32.0 - 36.0 g/dL   RDW 16.1  09.6 - 04.5 %   Platelets 422 (*) 145 - 400 10e3/uL   NEUT# 2.7  1.5 - 6.5 10e3/uL   LYMPH# 2.7  0.9 - 3.3 10e3/uL   MONO# 0.6  0.1 - 0.9 10e3/uL   Eosinophils Absolute 0.2  0.0 - 0.5 10e3/uL   BASO# 0.0  0.0 - 0.2 10e3/uL   NEUT% 43.2  39.6 - 80.0 %   LYMPH% 43.6  14.0 - 48.0 %   MONO% 8.9  0.0 - 13.0 %   EOS% 3.7  0.0 - 7.0 %   BASO% 0.6  0.0 - 2.0 %  LACTATE DEHYDROGENASE      Result Value Range   LDH 130  94 - 250 U/L  COMPREHENSIVE METABOLIC PANEL      Result Value Range   Sodium 137  135 - 145 mEq/L   Potassium 4.4  3.5 - 5.3 mEq/L   Chloride 100  96 - 112 mEq/L   CO2 29  19 - 32 mEq/L   Glucose, Bld 81  70 - 99 mg/dL   BUN 10  6 - 23 mg/dL   Creatinine, Ser 4.09 (*) 0.50 - 1.10 mg/dL   Total Bilirubin 0.4  0.3 - 1.2 mg/dL   Alkaline Phosphatase 80  39 - 117 U/L   AST 13  0 - 37 U/L   ALT 8  0 - 35 U/L   Total Protein 7.1  6.0 - 8.3 g/dL   Albumin 4.1  3.5 - 5.2 g/dL   Calcium 81.1  8.4 - 91.4 mg/dL     ASSESSMENT: CAD (coronary artery disease) - Plan: COMPLETE METABOLIC PANEL WITH GFR, NMR Lipoprofile with Lipids  HTN (hypertension) - Plan: COMPLETE METABOLIC PANEL WITH GFR  HLD (hyperlipidemia) - Plan: COMPLETE METABOLIC PANEL WITH GFR, NMR Lipoprofile with Lipids  Weakness of right  leg  Irritable bowel disease  GERD  Arthritis of neck - Plan: traMADol (ULTRAM) 50 MG tablet  Anxiety state, unspecified - Plan: ALPRAZolam (XANAX) 0.5 MG tablet  Back pain - Plan: traMADol (ULTRAM) 50 MG tablet  PLAN:  Orders Placed This Encounter  Procedures  . COMPLETE METABOLIC PANEL WITH GFR  . NMR Lipoprofile with Lipids   Meds ordered this encounter  Medications  . baclofen (LIORESAL) 10 MG tablet    Sig:   . atorvastatin (LIPITOR) 10 MG tablet    Sig: Take 10 mg by mouth daily.  Marland Kitchen ALPRAZolam (XANAX) 0.5 MG tablet    Sig: TAKE ONE TABLET BY MOUTH THREE TIMES DAILY    Dispense:  90 tablet    Refill:  1  . traMADol (ULTRAM) 50 MG tablet    Sig: Take 1 tablet (50 mg total) by mouth as needed.    Dispense:  30 tablet    Refill:  1   Keep follow up with the specialists.  No change in medications.  Return in about 3 months (around 11/29/2012) for Recheck medical problems.  Gentry Pilson P. Modesto Charon, M.D.

## 2012-08-30 LAB — COMPLETE METABOLIC PANEL WITH GFR
ALT: 9 U/L (ref 0–35)
AST: 15 U/L (ref 0–37)
Albumin: 4.3 g/dL (ref 3.5–5.2)
Alkaline Phosphatase: 86 U/L (ref 39–117)
BUN: 10 mg/dL (ref 6–23)
CO2: 29 mEq/L (ref 19–32)
Calcium: 9.9 mg/dL (ref 8.4–10.5)
Chloride: 99 mEq/L (ref 96–112)
Creat: 1.25 mg/dL — ABNORMAL HIGH (ref 0.50–1.10)
GFR, Est African American: 51 mL/min — ABNORMAL LOW
GFR, Est Non African American: 44 mL/min — ABNORMAL LOW
Glucose, Bld: 74 mg/dL (ref 70–99)
Potassium: 4.7 mEq/L (ref 3.5–5.3)
Sodium: 135 mEq/L (ref 135–145)
Total Bilirubin: 0.5 mg/dL (ref 0.3–1.2)
Total Protein: 7.2 g/dL (ref 6.0–8.3)

## 2012-09-02 LAB — NMR LIPOPROFILE WITH LIPIDS
Cholesterol, Total: 203 mg/dL — ABNORMAL HIGH (ref <200)
HDL Particle Number: 35.3 umol/L (ref >=30.5)
HDL Size: 9.6 nm (ref >=9.2)
HDL-C: 68 mg/dL (ref >=40)
LDL (calc): 109 mg/dL — ABNORMAL HIGH (ref <100)
LDL Particle Number: 1218 nmol/L — ABNORMAL HIGH (ref <1000)
LDL Size: 21.5 nm (ref >20.5)
LP-IR Score: <25 (ref <=45)
Large HDL-P: 11.7 umol/L (ref >=4.8)
Large VLDL-P: <0.8 nmol/L (ref <=2.7)
Small LDL Particle Number: 328 nmol/L (ref <=527)
Triglycerides: 131 mg/dL (ref <150)
VLDL Size: 40.5 nm (ref <=46.6)

## 2012-09-02 NOTE — Progress Notes (Signed)
Quick Note:  Lab result at goal. Although the kidney function is slowly becoming abnormal. No change in Medications for now. No Change in plans and follow up. ______

## 2012-09-03 ENCOUNTER — Telehealth: Payer: Self-pay | Admitting: Family Medicine

## 2012-09-03 NOTE — Telephone Encounter (Signed)
Pt aware of lab results 

## 2012-09-06 ENCOUNTER — Telehealth: Payer: Self-pay

## 2012-09-06 NOTE — Telephone Encounter (Signed)
Pt.notified

## 2012-09-06 NOTE — Telephone Encounter (Signed)
What is the frequency on the directions for tramadol? It just states take one tablet as needed.

## 2012-09-06 NOTE — Telephone Encounter (Signed)
One daily to twice a day when needed for intolerable pain.

## 2012-10-01 ENCOUNTER — Other Ambulatory Visit: Payer: Self-pay | Admitting: Family Medicine

## 2012-10-08 ENCOUNTER — Other Ambulatory Visit: Payer: Self-pay | Admitting: Family Medicine

## 2012-10-09 ENCOUNTER — Telehealth: Payer: Self-pay | Admitting: Family Medicine

## 2012-10-10 ENCOUNTER — Ambulatory Visit (INDEPENDENT_AMBULATORY_CARE_PROVIDER_SITE_OTHER): Payer: Medicare Other | Admitting: Family Medicine

## 2012-10-10 ENCOUNTER — Encounter: Payer: Self-pay | Admitting: Family Medicine

## 2012-10-10 VITALS — BP 112/72 | HR 60 | Temp 98.1°F | Ht 63.0 in | Wt 151.0 lb

## 2012-10-10 DIAGNOSIS — G373 Acute transverse myelitis in demyelinating disease of central nervous system: Secondary | ICD-10-CM

## 2012-10-10 DIAGNOSIS — G0489 Other myelitis: Secondary | ICD-10-CM

## 2012-10-10 NOTE — Patient Instructions (Signed)
Myalgia, Adult Myalgia is the medical term for muscle pain. It is a symptom of many things. Nearly everyone at some time in their life has this. The most common cause for muscle pain is overuse or straining and more so when you are not in shape. Injuries and muscle bruises cause myalgias. Muscle pain without a history of injury can also be caused by a virus. It frequently comes along with the flu. Myalgia not caused by muscle strain can be present in a large number of infectious diseases. Some autoimmune diseases like lupus and fibromyalgia can cause muscle pain. Myalgia may be mild, or severe. SYMPTOMS  The symptoms of myalgia are simply muscle pain. Most of the time this is short lived and the pain goes away without treatment. DIAGNOSIS  Myalgia is diagnosed by your caregiver by taking your history. This means you tell him when the problems began, what they are, and what has been happening. If this has not been a long term problem, your caregiver may want to watch for a while to see what will happen. If it has been long term, they may want to do additional testing. TREATMENT  The treatment depends on what the underlying cause of the muscle pain is. Often anti-inflammatory medications will help. HOME CARE INSTRUCTIONS  If the pain in your muscles came from overuse, slow down your activities until the problems go away.  Myalgia from overuse of a muscle can be treated with alternating hot and cold packs on the muscle affected or with cold for the first couple days. If either heat or cold seems to make things worse, stop their use.  Apply ice to the sore area for 15-20 minutes, 3-4 times per day, while awake for the first 2 days of muscle soreness, or as directed. Put the ice in a plastic bag and place a towel between the bag of ice and your skin.  Only take over-the-counter or prescription medicines for pain, discomfort, or fever as directed by your caregiver.  Regular gentle exercise may help if  you are not active.  Stretching before strenuous exercise can help lower the risk of myalgia. It is normal when beginning an exercise regimen to feel some muscle pain after exercising. Muscles that have not been used frequently will be sore at first. If the pain is extreme, this may mean injury to a muscle. SEEK MEDICAL CARE IF:  You have an increase in muscle pain that is not relieved with medication.  You begin to run a temperature.  You develop nausea and vomiting.  You develop a stiff and painful neck.  You develop a rash.  You develop muscle pain after a tick bite.  You have continued muscle pain while working out even after you are in good condition. SEEK IMMEDIATE MEDICAL CARE IF: Any of your problems are getting worse and medications are not helping. MAKE SURE YOU:   Understand these instructions.  Will watch your condition.  Will get help right away if you are not doing well or get worse. Document Released: 12/15/2005 Document Revised: 04/17/2011 Document Reviewed: 03/06/2006 Exit[employer] Patient Information 2014 [employer].  

## 2012-10-10 NOTE — Progress Notes (Signed)
  Subjective:    Patient ID: Mellony Danziger Faught, female    DOB: 1940-07-29, 72 y.o.   MRN: [id number]  HPI This 72 y.o. female presents for evaluation of right leg weakness, stiffness, and  Right foot drop.  She has recently seen a neurologist at [facility] and has been diagnosed With transverse myelitis and she was recommended by Dr. [facility] To have orthodics for her right foot drop and PT for her right LE weakness and stiffness. She is using a cane for now to help her with ambulation.   Review of Systems C/o right lower extremity myalgias and stiffness. No chest pain, SOB, HA, dizziness, vision change, N/V, diarrhea, constipation, dysuria, urinary urgency or frequency or rash.     Objective:   Physical Exam Vital signs noted  Well developed well nourished female.  HEENT - Head atraumatic Normocephalic Respiratory - Lungs CTA bilateral Cardiac - RRR S1 and S2 without murmur MS - Right foot drop and decreased strength right foot and leg.       Assessment & Plan:  Transverse myelitis - Plan: Ambulatory referral to Physical Therapy Rx for custom fit AFO boot right foot.

## 2012-10-14 NOTE — Telephone Encounter (Signed)
Pt notified we did receive papers from dr Elwyn Reach

## 2012-10-15 ENCOUNTER — Ambulatory Visit: Payer: Medicare Other | Admitting: Physical Therapy

## 2012-10-16 ENCOUNTER — Telehealth: Payer: Self-pay | Admitting: Cardiovascular Disease

## 2012-10-16 ENCOUNTER — Other Ambulatory Visit: Payer: Self-pay | Admitting: Family Medicine

## 2012-10-16 ENCOUNTER — Telehealth: Payer: Self-pay | Admitting: Family Medicine

## 2012-10-16 NOTE — Telephone Encounter (Signed)
Need refill on her Verapamil 80 mg #360

## 2012-10-16 NOTE — Telephone Encounter (Signed)
Returned call and advised fax to nurse station.  Agreed. 

## 2012-10-17 NOTE — Telephone Encounter (Signed)
Pt aware we do not have verapamil

## 2012-10-18 ENCOUNTER — Ambulatory Visit: Payer: Medicare Other | Attending: Family Medicine | Admitting: Physical Therapy

## 2012-10-18 ENCOUNTER — Other Ambulatory Visit: Payer: Self-pay | Admitting: *Deleted

## 2012-10-18 DIAGNOSIS — M25569 Pain in unspecified knee: Secondary | ICD-10-CM | POA: Insufficient documentation

## 2012-10-18 DIAGNOSIS — R5381 Other malaise: Secondary | ICD-10-CM | POA: Insufficient documentation

## 2012-10-18 DIAGNOSIS — IMO0001 Reserved for inherently not codable concepts without codable children: Secondary | ICD-10-CM | POA: Insufficient documentation

## 2012-10-18 NOTE — Telephone Encounter (Signed)
Patient needs to be seen. Has exceeded time since last visit. Needs to bring all medications to next appointment.   

## 2012-10-18 NOTE — Telephone Encounter (Signed)
Pharmacy sent rx request but patient did not have this on her med list. Please advise

## 2012-10-18 NOTE — Telephone Encounter (Signed)
Do not see this on med list. If approved route to nurse and call into [facility] [phone]

## 2012-10-18 NOTE — Telephone Encounter (Signed)
Not on her Rx list

## 2012-10-21 ENCOUNTER — Other Ambulatory Visit: Payer: Self-pay | Admitting: Family Medicine

## 2012-10-21 ENCOUNTER — Ambulatory Visit: Payer: Medicare Other | Admitting: Physical Therapy

## 2012-10-21 ENCOUNTER — Other Ambulatory Visit: Payer: Self-pay

## 2012-10-21 MED ORDER — CYCLOBENZAPRINE HCL 10 MG PO TABS
10.0000 mg | ORAL_TABLET | Freq: Three times a day (TID) | ORAL | Status: DC | PRN
Start: 2012-10-21 — End: 2013-01-23

## 2012-10-21 NOTE — Telephone Encounter (Signed)
[facility] notified of below message stated it was given to her on 01-2012 by [facility] but filled in August 2014 Requested that have pt contac office

## 2012-10-21 NOTE — Telephone Encounter (Signed)
[facility] notiifed of refusal for lomotil

## 2012-10-22 ENCOUNTER — Other Ambulatory Visit: Payer: Self-pay

## 2012-10-22 MED ORDER — DIPHENOXYLATE-ATROPINE 2.5-0.025 MG PO TABS: 1.0000 | ORAL_TABLET | Freq: Four times a day (QID) | ORAL | Status: DC

## 2012-10-23 ENCOUNTER — Other Ambulatory Visit: Payer: Self-pay

## 2012-10-23 ENCOUNTER — Ambulatory Visit: Payer: Medicare Other | Admitting: Physical Therapy

## 2012-10-23 NOTE — Telephone Encounter (Signed)
Print for patient to pick up.

## 2012-10-29 ENCOUNTER — Ambulatory Visit: Payer: Medicare Other | Admitting: Physical Therapy

## 2012-10-30 ENCOUNTER — Ambulatory Visit: Payer: Medicare Other | Admitting: Family Medicine

## 2012-10-31 ENCOUNTER — Ambulatory Visit: Payer: Medicare Other | Admitting: Physical Therapy

## 2012-11-06 ENCOUNTER — Ambulatory Visit: Payer: Medicare Other | Attending: Family Medicine | Admitting: Physical Therapy

## 2012-11-06 DIAGNOSIS — R5381 Other malaise: Secondary | ICD-10-CM | POA: Insufficient documentation

## 2012-11-06 DIAGNOSIS — M25569 Pain in unspecified knee: Secondary | ICD-10-CM | POA: Insufficient documentation

## 2012-11-06 DIAGNOSIS — IMO0001 Reserved for inherently not codable concepts without codable children: Secondary | ICD-10-CM | POA: Insufficient documentation

## 2012-11-07 ENCOUNTER — Ambulatory Visit: Payer: Medicare Other | Admitting: Physical Therapy

## 2012-11-12 ENCOUNTER — Encounter: Payer: Medicare Other | Admitting: Physical Therapy

## 2012-11-14 ENCOUNTER — Ambulatory Visit: Payer: Medicare Other | Admitting: Physical Therapy

## 2012-11-18 ENCOUNTER — Encounter: Payer: Self-pay | Admitting: *Deleted

## 2012-11-19 ENCOUNTER — Encounter: Payer: Self-pay | Admitting: Internal Medicine

## 2012-11-19 ENCOUNTER — Ambulatory Visit (INDEPENDENT_AMBULATORY_CARE_PROVIDER_SITE_OTHER): Payer: Medicare Other | Admitting: Internal Medicine

## 2012-11-19 VITALS — BP 138/88 | HR 70 | Ht 63.0 in | Wt 162.0 lb

## 2012-11-19 DIAGNOSIS — I201 Angina pectoris with documented spasm: Secondary | ICD-10-CM | POA: Insufficient documentation

## 2012-11-19 DIAGNOSIS — I209 Angina pectoris, unspecified: Secondary | ICD-10-CM

## 2012-11-19 DIAGNOSIS — G373 Acute transverse myelitis in demyelinating disease of central nervous system: Secondary | ICD-10-CM

## 2012-11-19 DIAGNOSIS — G0489 Other myelitis: Secondary | ICD-10-CM

## 2012-11-19 DIAGNOSIS — I10 Essential (primary) hypertension: Secondary | ICD-10-CM

## 2012-11-19 DIAGNOSIS — I2510 Atherosclerotic heart disease of native coronary artery without angina pectoris: Secondary | ICD-10-CM

## 2012-11-19 NOTE — Patient Instructions (Signed)
Your physician wants you to follow-up in:  6 months. You will receive a reminder letter in the mail two months in advance. If you don't receive a letter, please call our office to schedule the follow-up appointment.   

## 2012-11-19 NOTE — Progress Notes (Signed)
OFFICE NOTE  Chief Complaint:  Routine follow-up  Primary Care Physician: Redmond Baseman, MD  HPI:  Amy Black is a very pleasant 72 year old female followed by Dr. Alanda Amass since the 1980s. She has a history of chest pain in the past and a remote cardiac catheterization in 1970s with ergotamine provocation which was positive, suggesting coronary spasm. Since then she has done well with chronic calcium channel blockers, especially verapamil 4 times daily. Unfortunately, she had a recent acute episode of transverse myelitis which she noted after a car ride and getting out she had some right foot drag. She had an MRI which showed a non-expansile signal in the right hemicord at T11-T12 with patchy enhancement and sparing of the conus. She was then referred to [facility] neurology to see Dr. Pershing Cox who confirmed the diagnosis of transverse myelitis, without a clear etiology. Since then she's undergone rehabilitation and is being fitted for a brace. Her insurance no longer covers out of network care at [facility] therefore she is awaiting an appointment with a local neurologist. She has been having pain in her right leg mostly at night when she lays flat which is improved somewhat with tramadol. Apparently she is failed gabapentin in the past and occasionally takes muscle relaxants. She also has dyslipidemia but is well-controlled on low-dose Lipitor. She had a echocardiogram which was essentially normal except for mild mitral regurgitation and April of 2014. She had a stress test in 2013 which was negative as well.  PMHx:  Past Medical History  Diagnosis Date  . CAD (coronary artery disease)   . Irritable bowel syndrome   . Diverticulosis of colon (without mention of hemorrhage)   . hepatic cyst   . Hyperlipidemia   . Adenosquamous carcinoma     left leg 2004 - radiation & resection  . HTN (hypertension)     PT. DENIES  . History of cardiac catheterization   . Unstable angina   .  Adenocarcinoma     LEFT LEG  . Insomnia   . Vitamin D deficiency   . Adenocarcinoma of unknown primary 02/06/2011  . History of nuclear stress test 04/2011    lexiscan; normal study, no significant ischemia, low risk     Past Surgical History  Procedure Laterality Date  . Resection of leg lesion and radiation  2004  . Diagnostic laparoscopy    . Extensive lysis of adhesions    . Left salpingo-oophorectomy    . Abdominal hysterectomy    . Pelvic laparoscopy  1992    RSO, AND LSO ON 2007  . Cardiac catheterization      coronary spasm  . Transthoracic echocardiogram  07/2012    LV cavity size mildly reduced, normal wall motion; MV with calcified annulus and mild MR; LA mildly dilated; atrial septum with increased thickness - lipomatous hypertrophy; RV systolic pressure increased (borderline pulm HTN)    FAMHx:  Family History  Problem Relation Age of Onset  . Diabetes      Multiple family members on both sides   . Coronary artery disease      Multiple family members on both sides   . Cancer Mother     Bladder  . Heart disease Mother   . Hypertension Mother   . Colon cancer Neg Hx   . Heart disease Father   . Stroke Father     SOCHx:   reports that she quit smoking about 40 years ago. Her smoking use included Cigarettes. She smoked  0.00 packs per day. She has never used smokeless tobacco. She reports that she does not drink alcohol or use illicit drugs.  ALLERGIES:  Allergies  Allergen Reactions  . Iodine     Itching     ROS: A comprehensive review of systems was negative except for: Musculoskeletal: positive for back pain, muscle weakness and myalgias Neurological: positive for weakness and foot drop  HOME MEDS: Current Outpatient Prescriptions  Medication Sig Dispense Refill  . ALPRAZolam (XANAX) 0.5 MG tablet TAKE ONE TABLET BY MOUTH THREE TIMES DAILY  90 tablet  1  . aspirin 81 MG tablet Take 1 tablet (81 mg total) by mouth daily.  30 tablet    . atorvastatin  (LIPITOR) 10 MG tablet Take 10 mg by mouth daily.      . baclofen (LIORESAL) 10 MG tablet Take 10 mg by mouth 3 (three) times daily.       . cephALEXin (KEFLEX) 500 MG capsule Take 500 mg by mouth 3 (three) times daily.       . cyclobenzaprine (FLEXERIL) 10 MG tablet Take 1 tablet (10 mg total) by mouth 3 (three) times daily as needed for muscle spasms.  90 tablet  2  . diphenoxylate-atropine (LOMOTIL) 2.5-0.025 MG per tablet Take 1 tablet by mouth 4 (four) times daily.  120 tablet  5  . loperamide (IMODIUM) 2 MG capsule as needed.      Marland Kitchen NITROSTAT 0.4 MG SL tablet Place 0.4 mg under the tongue every 5 (five) minutes as needed. For chest pain      . pantoprazole (PROTONIX) 40 MG tablet Take 40 mg by mouth 2 (two) times daily.       . traMADol (ULTRAM) 50 MG tablet Take 1 tablet (50 mg total) by mouth as needed.  30 tablet  1  . triamterene-hydrochlorothiazide (DYAZIDE) 37.5-25 MG per capsule Take 1 each (1 capsule total) by mouth every Monday, Wednesday, and Friday.  30 capsule  11  . verapamil (CALAN) 80 MG tablet Take 1 tablet (80 mg total) by mouth 4 (four) times daily - after meals and at bedtime.  90 tablet  0   No current facility-administered medications for this visit.    LABS/IMAGING: No results found for this or any previous visit (from the past 48 hour(s)). No results found.  VITALS: BP 138/88  Pulse 70  Ht 5\' 3"  (1.6 m)  Wt 162 lb (73.483 kg)  BMI 28.7 kg/m2  EXAM: General appearance: alert and no distress Neck: no adenopathy, no carotid bruit, no JVD, supple, symmetrical, trachea midline and thyroid not enlarged, symmetric, no tenderness/mass/nodules Lungs: clear to auscultation bilaterally Heart: regular rate and rhythm, S1, S2 normal, 2/6 systolic murmur at apex, no click, rub or gallop Abdomen: soft, non-tender; bowel sounds normal; no masses,  no organomegaly Extremities: extremities normal, atraumatic, no cyanosis or edema Pulses: 2+ and symmetric Skin: Skin color,  texture, turgor normal. No rashes or lesions Neurologic: Mental status: Alert, oriented, thought content appropriate, there is decreased extensor strength in the right foot, walks with cane Psych: Very pleasant mood, normal affect  EKG: Normal sinus rhythm at 70  ASSESSMENT: 1. Chest pain with history of coronary spasm-well controlled on for abdominal 2. Hypertension-at goal 3. Recent transverse myelitis of unknown etiology, on aspirin 4. Dyslipidemia-at goal on low-dose atorvastatin  PLAN: 1.   Mrs. Duarte is doing very well without recurrence of her chest pain for many years on verapamil. Her blood pressure is well controlled and I'm not  made any adjustments today. She has a well-controlled lipid profile with total cholesterol less than 200 on low-dose Lipitor. She is now establishing neurologic care with a local neurologist for ongoing treatment of her foot drop related to transverse myelitis. She had a recent echocardiogram which shows mild mitral regurgitation, but is otherwise normal. Overall I think she is doing well plan to see her back in 6 months for ongoing care.  Chrystie Nose, MD, [facility] Attending Cardiologist [facility]  Lashan Macias C 11/19/2012, 11:47 AM

## 2012-11-20 ENCOUNTER — Ambulatory Visit: Payer: Medicare Other | Admitting: Physical Therapy

## 2012-11-20 ENCOUNTER — Encounter: Payer: Self-pay | Admitting: Internal Medicine

## 2012-11-21 ENCOUNTER — Ambulatory Visit: Payer: Medicare Other | Admitting: Physical Therapy

## 2012-11-26 ENCOUNTER — Ambulatory Visit: Payer: Medicare Other | Admitting: Physical Therapy

## 2012-11-27 ENCOUNTER — Telehealth: Payer: Self-pay | Admitting: Family Medicine

## 2012-11-27 ENCOUNTER — Ambulatory Visit: Payer: Medicare Other | Admitting: Physical Therapy

## 2012-11-28 ENCOUNTER — Encounter: Payer: Medicare Other | Admitting: Physical Therapy

## 2012-11-28 NOTE — Telephone Encounter (Signed)
Left message for pt to return call.

## 2012-12-02 ENCOUNTER — Ambulatory Visit: Payer: Medicare Other | Admitting: Physical Therapy

## 2012-12-03 ENCOUNTER — Ambulatory Visit (INDEPENDENT_AMBULATORY_CARE_PROVIDER_SITE_OTHER): Payer: Medicare Other | Admitting: Family Medicine

## 2012-12-03 ENCOUNTER — Encounter: Payer: Self-pay | Admitting: Family Medicine

## 2012-12-03 DIAGNOSIS — M47812 Spondylosis without myelopathy or radiculopathy, cervical region: Secondary | ICD-10-CM

## 2012-12-03 DIAGNOSIS — G0489 Other myelitis: Secondary | ICD-10-CM

## 2012-12-03 DIAGNOSIS — G373 Acute transverse myelitis in demyelinating disease of central nervous system: Secondary | ICD-10-CM

## 2012-12-03 DIAGNOSIS — M549 Dorsalgia, unspecified: Secondary | ICD-10-CM

## 2012-12-03 MED ORDER — TRAMADOL HCL 50 MG PO TABS: 50.0000 mg | ORAL_TABLET | ORAL | Status: DC | PRN

## 2012-12-03 NOTE — Progress Notes (Signed)
Patient ID: Amy Black, female   DOB: 1941-01-14, 72 y.o.   MRN: [id number] SUBJECTIVE: CC: Chief Complaint  Patient presents with  . Follow-up    c/o "alot pain at night and wants to see another neurologist . statesa has seen neurologist at [facility] and has seen Dr Debarah Crape and wants to see someone else.      HPI: Has had right foot drop and right lower extremity weakness and pain. Has had evaluation by Dr Terrace Arabia at [facility]. The patient then went to [facility] and had a second opinion from Neurology. The reports were reviewed and scanned in the chart /EPIC. The summary is that the Neurologist at [facility] suspects that the patient did not have Multiple Sclerosis but has a spinal cord Transverse Myelitis. And her present symptoms is residual neurologic symptoms. The patient requires areferral to another neurologist due to her limitations for an in Network specialist. She prefers not to go back to [facility]. Symptoms persists. And patient is concerned that with the ain the TM may have flared up.   Past Medical History  Diagnosis Date  . CAD (coronary artery disease)   . Irritable bowel syndrome   . Diverticulosis of colon (without mention of hemorrhage)   . hepatic cyst   . Hyperlipidemia   . Adenosquamous carcinoma     left leg 2004 - radiation & resection  . HTN (hypertension)     PT. DENIES  . History of cardiac catheterization   . Unstable angina   . Adenocarcinoma     LEFT LEG  . Insomnia   . Vitamin D deficiency   . Adenocarcinoma of unknown primary 02/06/2011  . History of nuclear stress test 04/2011    lexiscan; normal study, no significant ischemia, low risk    Past Surgical History  Procedure Laterality Date  . Resection of leg lesion and radiation  2004  . Diagnostic laparoscopy    . Extensive lysis of adhesions    . Left salpingo-oophorectomy    . Abdominal hysterectomy    . Pelvic laparoscopy  1992    RSO, AND LSO ON 2007  . Cardiac catheterization     coronary spasm  . Transthoracic echocardiogram  07/2012    LV cavity size mildly reduced, normal wall motion; MV with calcified annulus and mild MR; LA mildly dilated; atrial septum with increased thickness - lipomatous hypertrophy; RV systolic pressure increased (borderline pulm HTN)   History   Social History  . Marital Status: Married    Spouse Name: N/A    Number of Children: 2  . Years of Education: N/A   Occupational History  . Disabilty   . DISABILITY    Social History Main Topics  . Smoking status: Former Smoker    Types: Cigarettes    Quit date: 11/20/1972  . Smokeless tobacco: Never Used  . Alcohol Use: No  . Drug Use: No  . Sexual Activity: No   Other Topics Concern  . Not on file   Social History Narrative  . No narrative on file   Family History  Problem Relation Age of Onset  . Diabetes      Multiple family members on both sides   . Coronary artery disease      Multiple family members on both sides   . Cancer Mother     Bladder  . Heart disease Mother   . Hypertension Mother   . Colon cancer Neg Hx   . Heart disease Father   .  Stroke Father    Current Outpatient Prescriptions on File Prior to Visit  Medication Sig Dispense Refill  . ALPRAZolam (XANAX) 0.5 MG tablet TAKE ONE TABLET BY MOUTH THREE TIMES DAILY  90 tablet  1  . aspirin 81 MG tablet Take 1 tablet (81 mg total) by mouth daily.  30 tablet    . atorvastatin (LIPITOR) 10 MG tablet Take 10 mg by mouth daily.      . baclofen (LIORESAL) 10 MG tablet Take 10 mg by mouth 3 (three) times daily.       . cyclobenzaprine (FLEXERIL) 10 MG tablet Take 1 tablet (10 mg total) by mouth 3 (three) times daily as needed for muscle spasms.  90 tablet  2  . diphenoxylate-atropine (LOMOTIL) 2.5-0.025 MG per tablet Take 1 tablet by mouth 4 (four) times daily.  120 tablet  5  . loperamide (IMODIUM) 2 MG capsule as needed.      Marland Kitchen NITROSTAT 0.4 MG SL tablet Place 0.4 mg under the tongue every 5 (five) minutes as  needed. For chest pain      . pantoprazole (PROTONIX) 40 MG tablet Take 40 mg by mouth 2 (two) times daily.       Marland Kitchen triamterene-hydrochlorothiazide (DYAZIDE) 37.5-25 MG per capsule Take 1 each (1 capsule total) by mouth every Monday, Wednesday, and Friday.  30 capsule  11  . verapamil (CALAN) 80 MG tablet Take 1 tablet (80 mg total) by mouth 4 (four) times daily - after meals and at bedtime.  90 tablet  0   No current facility-administered medications on file prior to visit.   Allergies  Allergen Reactions  . Iodine     Itching    Immunization History  Administered Date(s) Administered  . Pneumococcal Polysaccharide 02/07/2000  . Tdap 02/06/2002  . Zoster 02/06/2010   Prior to Admission medications   Medication Sig Start Date End Date Taking? Authorizing Provider  ALPRAZolam Prudy Feeler) 0.5 MG tablet TAKE ONE TABLET BY MOUTH THREE TIMES DAILY 08/29/12   Ileana Ladd, MD  aspirin 81 MG tablet Take 1 tablet (81 mg total) by mouth daily. 02/16/12   Mcarthur Rossetti Angiulli, PA-C  atorvastatin (LIPITOR) 10 MG tablet Take 10 mg by mouth daily.    Historical Provider, MD  baclofen (LIORESAL) 10 MG tablet Take 10 mg by mouth 3 (three) times daily.  07/27/12   Historical Provider, MD  cyclobenzaprine (FLEXERIL) 10 MG tablet Take 1 tablet (10 mg total) by mouth 3 (three) times daily as needed for muscle spasms. 10/21/12   Deatra Canter, FNP  diphenoxylate-atropine (LOMOTIL) 2.5-0.025 MG per tablet Take 1 tablet by mouth 4 (four) times daily. 10/22/12   Deatra Canter, FNP  loperamide (IMODIUM) 2 MG capsule as needed. 10/25/12   Historical Provider, MD  NITROSTAT 0.4 MG SL tablet Place 0.4 mg under the tongue every 5 (five) minutes as needed. For chest pain 11/22/11   Historical Provider, MD  pantoprazole (PROTONIX) 40 MG tablet Take 40 mg by mouth 2 (two) times daily.  05/15/12   Historical Provider, MD  traMADol (ULTRAM) 50 MG tablet Take 1 tablet (50 mg total) by mouth as needed. 08/29/12   Ileana Ladd,  MD  triamterene-hydrochlorothiazide (DYAZIDE) 37.5-25 MG per capsule Take 1 each (1 capsule total) by mouth every Monday, Wednesday, and Friday. 05/28/12   Ileana Ladd, MD  verapamil (CALAN) 80 MG tablet Take 1 tablet (80 mg total) by mouth 4 (four) times daily - after meals and at  bedtime. 02/16/12   Daniel J Angiulli, PA-C     ROS: As above in the HPI. All other systems are stable or negative.  OBJECTIVE: APPEARANCE:  Patient in no acute distress.The patient appeared well nourished and normally developed. Acyanotic. Waist: VITAL SIGNS:There were no vitals taken for this visit. AAF  SKIN: warm and  Dry without overt rashes, tattoos and scars  HEAD and Neck: without JVD, Head and scalp: normal Eyes:No scleral icterus. Fundi normal, eye movements normal. Ears: Auricle normal, canal normal, Tympanic membranes normal, insufflation normal. Nose: normal Throat: normal Neck & thyroid: normal  CHEST & LUNGS: Chest wall: normal Lungs: Clear  CVS: Reveals the PMI to be normally located. Regular rhythm, First and Second Heart sounds are normal,  absence of murmurs, rubs or gallops. Peripheral vasculature: Radial pulses: normal Dorsal pedis pulses: normal Posterior pulses: normal  ABDOMEN:  Appearance: normal Benign, no organomegaly, no masses, no Abdominal Aortic enlargement. No Guarding , no rebound. No Bruits. Bowel sounds: normal  RECTAL: N/A GU: N/A  EXTREMETIES: nonedematous.  MUSCULOSKELETAL:  Spine: reduced ROM of the neck.and lumbar spine. Joints: intact  NEUROLOGIC: oriented to time,place and person; nonfocal. Strength is 4/5 right lower extremity. With a right foot drop. Sensory is normal Reflexes are reduced in the RLE Cranial Nerves are normal.  ASSESSMENT: Transverse myelitis - Plan: Ambulatory referral to Neurology, traMADol (ULTRAM) 50 MG tablet  Arthritis of neck - Plan: traMADol (ULTRAM) 50 MG tablet  Back pain - Plan: traMADol (ULTRAM) 50 MG  tablet  PLAN:  Orders Placed This Encounter  Procedures  . Ambulatory referral to Neurology    Referral Priority:  Routine    Referral Type:  Consultation    Referral Reason:  Specialty Services Required    Requested Specialty:  Neurology    Number of Visits Requested:  1   Meds ordered this encounter  Medications  . traMADol (ULTRAM) 50 MG tablet    Sig: Take 1 tablet (50 mg total) by mouth as needed.    Dispense:  30 tablet    Refill:  0   Medications Discontinued During This Encounter  Medication Reason  . cephALEXin (KEFLEX) 500 MG capsule Completed Course  . traMADol (ULTRAM) 50 MG tablet Reorder  reviewed the records from [facility] with patient. Discussed need for specialty follow up. Will refer to Dr Vela Prose in [place]. Return in about 2 months (around 02/02/2013) for Recheck medical problems.  Lovelee Forner P. Modesto Charon, M.D.

## 2012-12-03 NOTE — Telephone Encounter (Signed)
Patient to be seen today.

## 2012-12-03 NOTE — Telephone Encounter (Signed)
Pt seen today by Dr Modesto Charon and this was discussesd

## 2012-12-05 ENCOUNTER — Ambulatory Visit: Payer: Medicare Other | Admitting: Family Medicine

## 2012-12-05 ENCOUNTER — Ambulatory Visit: Payer: Medicare Other | Admitting: Physical Therapy

## 2012-12-05 ENCOUNTER — Telehealth: Payer: Self-pay | Admitting: Family Medicine

## 2012-12-10 ENCOUNTER — Encounter: Payer: Medicare Other | Admitting: Physical Therapy

## 2012-12-11 ENCOUNTER — Ambulatory Visit: Payer: Medicare Other | Attending: Family Medicine | Admitting: Physical Therapy

## 2012-12-11 DIAGNOSIS — M25569 Pain in unspecified knee: Secondary | ICD-10-CM | POA: Insufficient documentation

## 2012-12-11 DIAGNOSIS — R5381 Other malaise: Secondary | ICD-10-CM | POA: Insufficient documentation

## 2012-12-11 DIAGNOSIS — IMO0001 Reserved for inherently not codable concepts without codable children: Secondary | ICD-10-CM | POA: Insufficient documentation

## 2012-12-11 NOTE — Telephone Encounter (Signed)
PT SAID HER APPT IS DEC 11 WITH DR. Vela Prose. I SPOKE WITH CARLON AND SHE IS GOING TO CALL THERE OFFICE TODAY AND TRY TO MOVE TO APPT SOONER. PT SSAID SHE DIDN'T NEE MED REFILLED. SHE SAID DR. Modesto Charon TOLD HER SHE WOULD HAVE TO GET MEDS FROM THE NEUROLOGIST.

## 2012-12-11 NOTE — Telephone Encounter (Signed)
This patient has been to [facility], then to [facility], and at our last visit she wanted me to do the referral to Dr Vela Prose in [place] and I expedited the referral. He has a long waiting time, and I requested referral to see if they could see her sooner. Her Dx is Transverse Myelitis affecting the spinal cord and causing a foot drop.  Now she wants to see a neurologist in [place]??.[facility] recommended that she see a local neurologist because she was out of network and it would be costly for her. So where are we with her referral to Dr Clarisse Gouge??? Does she want to see Dr Janann Colonel instead??? Does she want to go back to [facility]??? This person is trying to see a neurologist in various places and her care will get confusing. And what medicine does she think she needs to refill????  Please clarify these issues with her and I will expedite another referral tomorrow if necessary.  Fleda Pagel P. Modesto Charon, M.D.

## 2012-12-12 ENCOUNTER — Other Ambulatory Visit: Payer: Self-pay | Admitting: Family Medicine

## 2012-12-12 ENCOUNTER — Encounter: Payer: Medicare Other | Admitting: Physical Therapy

## 2012-12-13 NOTE — Telephone Encounter (Signed)
Call patient : Prescription refilled & sent to pharmacy in [facility]. 

## 2012-12-13 NOTE — Telephone Encounter (Signed)
rx called in to [facility] 

## 2012-12-16 NOTE — Telephone Encounter (Signed)
Pt needs appt sooner with Dr. Vela Prose. Has appt 01/16/13

## 2012-12-18 ENCOUNTER — Ambulatory Visit: Payer: Medicare Other | Admitting: Physical Therapy

## 2012-12-19 ENCOUNTER — Ambulatory Visit: Payer: Medicare Other | Admitting: Physical Therapy

## 2012-12-24 ENCOUNTER — Encounter: Payer: Medicare Other | Admitting: Physical Therapy

## 2012-12-25 ENCOUNTER — Ambulatory Visit: Payer: Medicare Other | Admitting: Physical Therapy

## 2012-12-31 ENCOUNTER — Telehealth: Payer: Self-pay | Admitting: Family Medicine

## 2012-12-31 ENCOUNTER — Encounter: Payer: Medicare Other | Admitting: Physical Therapy

## 2012-12-31 NOTE — Telephone Encounter (Signed)
PUT CALL TO [facility]. RS

## 2013-01-01 ENCOUNTER — Encounter: Payer: Medicare Other | Admitting: Physical Therapy

## 2013-01-06 ENCOUNTER — Telehealth: Payer: Self-pay | Admitting: Family Medicine

## 2013-01-06 NOTE — Telephone Encounter (Signed)
Is there anyway she can get into the [facility] any earlier she has an appointment on the 11

## 2013-01-06 NOTE — Telephone Encounter (Signed)
Pt can not be seen any sooner.

## 2013-01-06 NOTE — Telephone Encounter (Signed)
Patient aware.

## 2013-01-08 ENCOUNTER — Telehealth: Payer: Self-pay | Admitting: Family Medicine

## 2013-01-08 ENCOUNTER — Other Ambulatory Visit: Payer: Self-pay

## 2013-01-08 MED ORDER — NITROGLYCERIN 0.4 MG SL SUBL: 0.4000 mg | SUBLINGUAL_TABLET | SUBLINGUAL | Status: DC | PRN

## 2013-01-08 NOTE — Telephone Encounter (Signed)
Rx was sent to pharmacy electronically. 

## 2013-01-09 ENCOUNTER — Ambulatory Visit: Payer: Medicare Other | Admitting: Family Medicine

## 2013-01-09 NOTE — Telephone Encounter (Signed)
Spoke with pt this am and she verbalizes she is going to call her dentist this am and see what they say and do otherwise if she needs anything from [place] she will call us back

## 2013-01-10 ENCOUNTER — Ambulatory Visit: Payer: Medicare Other | Admitting: Family Medicine

## 2013-01-14 ENCOUNTER — Encounter: Payer: Medicare Other | Admitting: Physical Therapy

## 2013-01-15 ENCOUNTER — Encounter: Payer: Medicare Other | Admitting: Physical Therapy

## 2013-01-16 ENCOUNTER — Telehealth: Payer: Self-pay | Admitting: Family Medicine

## 2013-01-16 NOTE — Telephone Encounter (Signed)
error 

## 2013-01-20 ENCOUNTER — Other Ambulatory Visit: Payer: Self-pay | Admitting: Family Medicine

## 2013-01-22 ENCOUNTER — Telehealth: Payer: Self-pay | Admitting: Hematology & Oncology

## 2013-01-22 NOTE — Telephone Encounter (Signed)
Pt moved 12-19 to 12-18

## 2013-01-22 NOTE — Telephone Encounter (Signed)
Last see 12/03/12  [facility]  If approved route to nurse to call into [facility]  [phone]

## 2013-01-23 ENCOUNTER — Other Ambulatory Visit (HOSPITAL_BASED_OUTPATIENT_CLINIC_OR_DEPARTMENT_OTHER): Payer: Medicare Other | Admitting: Lab

## 2013-01-23 ENCOUNTER — Other Ambulatory Visit: Payer: Self-pay | Admitting: Neurology

## 2013-01-23 ENCOUNTER — Ambulatory Visit (HOSPITAL_BASED_OUTPATIENT_CLINIC_OR_DEPARTMENT_OTHER): Payer: Medicare Other | Admitting: Hematology & Oncology

## 2013-01-23 VITALS — BP 118/67 | HR 67 | Temp 97.8°F | Resp 14 | Ht 63.0 in | Wt 159.0 lb

## 2013-01-23 DIAGNOSIS — D75839 Thrombocytosis, unspecified: Secondary | ICD-10-CM

## 2013-01-23 DIAGNOSIS — D473 Essential (hemorrhagic) thrombocythemia: Secondary | ICD-10-CM

## 2013-01-23 DIAGNOSIS — C801 Malignant (primary) neoplasm, unspecified: Secondary | ICD-10-CM

## 2013-01-23 DIAGNOSIS — G373 Acute transverse myelitis in demyelinating disease of central nervous system: Secondary | ICD-10-CM

## 2013-01-23 DIAGNOSIS — Z859 Personal history of malignant neoplasm, unspecified: Secondary | ICD-10-CM

## 2013-01-23 LAB — CBC WITH DIFFERENTIAL (CANCER CENTER ONLY)
BASO#: 0.1 10*3/uL (ref 0.0–0.2)
BASO%: 0.8 % (ref 0.0–2.0)
EOS%: 2.9 % (ref 0.0–7.0)
Eosinophils Absolute: 0.2 10*3/uL (ref 0.0–0.5)
HCT: 36.5 % (ref 34.8–46.6)
HGB: 11.9 g/dL (ref 11.6–15.9)
LYMPH#: 2.3 10*3/uL (ref 0.9–3.3)
LYMPH%: 31.3 % (ref 14.0–48.0)
MCH: 30.5 pg (ref 26.0–34.0)
MCHC: 32.6 g/dL (ref 32.0–36.0)
MCV: 94 fL (ref 81–101)
MONO#: 0.7 10*3/uL (ref 0.1–0.9)
MONO%: 8.9 % (ref 0.0–13.0)
NEUT#: 4.1 10*3/uL (ref 1.5–6.5)
NEUT%: 56.1 % (ref 39.6–80.0)
Platelets: 456 10*3/uL — ABNORMAL HIGH (ref 145–400)
RBC: 3.9 10*6/uL (ref 3.70–5.32)
RDW: 13.7 % (ref 11.1–15.7)
WBC: 7.3 10*3/uL (ref 3.9–10.0)

## 2013-01-23 LAB — COMPREHENSIVE METABOLIC PANEL
ALT: 9 U/L (ref 0–35)
AST: 14 U/L (ref 0–37)
Albumin: 4.3 g/dL (ref 3.5–5.2)
Alkaline Phosphatase: 105 U/L (ref 39–117)
BUN: 13 mg/dL (ref 6–23)
CO2: 30 mEq/L (ref 19–32)
Calcium: 10 mg/dL (ref 8.4–10.5)
Chloride: 102 mEq/L (ref 96–112)
Creatinine, Ser: 0.92 mg/dL (ref 0.50–1.10)
Glucose, Bld: 75 mg/dL (ref 70–99)
Potassium: 4.7 mEq/L (ref 3.5–5.3)
Sodium: 140 mEq/L (ref 135–145)
Total Bilirubin: 0.5 mg/dL (ref 0.3–1.2)
Total Protein: 7.2 g/dL (ref 6.0–8.3)

## 2013-01-23 NOTE — Progress Notes (Signed)
This office note has been dictated.

## 2013-01-24 ENCOUNTER — Other Ambulatory Visit: Payer: Medicare Other | Admitting: Lab

## 2013-01-24 ENCOUNTER — Ambulatory Visit: Payer: Medicare Other | Admitting: Hematology & Oncology

## 2013-01-24 NOTE — Telephone Encounter (Signed)
Rx ready for nurse to Phone in. 

## 2013-01-24 NOTE — Progress Notes (Signed)
DIAGNOSES: 1. Adenosquamous carcinoma of unknown primary --- remission x10 years. 2. Transverse myelitis.  CURRENT THERAPY:  Observation.  INTERIM HISTORY:  Ms. Backer comes in for followup.  She apparently was recently diagnosed with transverse myelitis.  This apparently was diagnosed out at [facility].  This was actually, I think, earlier in the year.  She is doing okay with this.  She was seen by Neurology for this.  She has had no other health problems.  She has been walking okay.  She does have a little bit of weakness, I think, in her right leg.  She has had no nausea or vomiting.  She has had no cough.  There has been no rashes.  There has been no weight loss or weight gain.  She has had no headache.  There has been no visual issues.  She did have an MRI of the thoracic spine back in January of this year. She had abnormalities at T11-T12.  Otherwise, the MRI, I think, looked okay.  PHYSICAL EXAMINATION:  General:  This is a fairly well-developed, well- nourished [place] American female, in no obvious distress.  Vital Signs: Temperature of 97.8, pulse 67, respiratory rate 14, blood pressure 118/67.  Weight is 159 pounds.  Head and Neck:  Normocephalic, atraumatic skull.  There are no ocular or oral lesions.  There are no palpable cervical or supraclavicular lymph nodes.  Lungs:  Clear bilaterally.  Cardiac:  Regular rate and rhythm with a normal S1 and S2. There are no murmurs, rubs, or bruits.  Abdomen:  Soft.  She has good bowel sounds.  There is no fluid wave.  There is no palpable hepatosplenomegaly.  Back:  No tenderness over the spine, ribs, or hips. Extremities:  No clubbing, cyanosis, or edema.  She has good range of motion of her joints.  Skin:  No rashes, ecchymoses, or petechiae. Neurologic:  Some slight weakness, I think, in her right leg.  LABORATORY STUDIES:  White cell count 7.3, hemoglobin 11.9, hematocrit 36.5, platelet count 456.  MCV is 94.  IMPRESSION:  Ms.  Proffit is a 72 year old African American female.  She presented with an adenosquamous carcinoma of unknown primary 10 years ago.  She had this resected, then had radiation for this.  This is in the right lower leg.  I am not sure why the transverse myelitis.  Again, I do not see anything that would suggest a paraneoplastic process from an underlying malignancy.  I did note that her platelet count is going up slowly.  I did look at her blood smear.  I do not see anything on the blood smear that looked suspicious.  There is no nucleated red cells.  I saw no immature myeloid cells.  She had no blasts.  Her platelets appeared to be rather uniform in size.  There may have been a few large platelets.  I think we have to watch this thrombocytosis.  It could be reactive.  It could be indicative of an underlying myeloproliferative neoplasm.  For now, I want to see her back in 3 months' time.  I want to make sure that we do follow up with this.    ______________________________ Josph Macho, M.D. PRE/MEDQ  D:  01/23/2013  T:  01/24/2013  Job:  [id number]

## 2013-01-27 NOTE — Telephone Encounter (Signed)
Xanax called in 

## 2013-02-03 ENCOUNTER — Telehealth: Payer: Self-pay | Admitting: Gastroenterology

## 2013-02-04 ENCOUNTER — Ambulatory Visit: Payer: Medicare Other | Admitting: Family Medicine

## 2013-02-04 NOTE — Telephone Encounter (Signed)
Pt was dx with Transverse Myelitis 1 year ago today. She describes this as fluid on her spine and it attacks her bladder and her bowels. She is seeing Dr McDiarmid q89months for her bladder. She reports ,not every time, but often, when she has a BM the stool is not formed and she continues to wipe all day. Pt wants to know if this is what she shouid expect from the disease.last EGD 08/29/10 with Dilation, Gastrtis, HP-. Last COLON 12/24/09 with moderate Diverticulosis Sigmoid to Descending, probable IBS.  Informed pt I feel this is something she needs to discuss in person with Dr Jarold Motto and she agreed; appt 02/25/13.

## 2013-02-05 ENCOUNTER — Ambulatory Visit
Admission: RE | Admit: 2013-02-05 | Discharge: 2013-02-05 | Disposition: A | Discharge status: Home / Self Care | Payer: Medicare Other | Source: Ambulatory Visit | Attending: Neurology | Admitting: Neurology

## 2013-02-05 DIAGNOSIS — G373 Acute transverse myelitis in demyelinating disease of central nervous system: Secondary | ICD-10-CM

## 2013-02-05 MED ORDER — GADOBENATE DIMEGLUMINE 529 MG/ML IV SOLN
15.0000 mL | Freq: Once | INTRAVENOUS | Status: AC | PRN
  Administered 2013-02-05: 15 mL via INTRAVENOUS

## 2013-02-10 ENCOUNTER — Ambulatory Visit (INDEPENDENT_AMBULATORY_CARE_PROVIDER_SITE_OTHER): Payer: Medicare Other | Admitting: Family Medicine

## 2013-02-10 ENCOUNTER — Ambulatory Visit (HOSPITAL_COMMUNITY)
Admission: RE | Admit: 2013-02-10 | Discharge: 2013-02-10 | Disposition: A | Discharge status: Home / Self Care | Payer: Medicare Other | Source: Ambulatory Visit | Attending: Family Medicine | Admitting: Family Medicine

## 2013-02-10 ENCOUNTER — Encounter: Payer: Self-pay | Admitting: Family Medicine

## 2013-02-10 ENCOUNTER — Ambulatory Visit (INDEPENDENT_AMBULATORY_CARE_PROVIDER_SITE_OTHER): Payer: Medicare Other

## 2013-02-10 VITALS — BP 115/71 | HR 77 | Temp 97.8°F | Ht 63.0 in | Wt 162.6 lb

## 2013-02-10 DIAGNOSIS — G373 Acute transverse myelitis in demyelinating disease of central nervous system: Secondary | ICD-10-CM

## 2013-02-10 DIAGNOSIS — M79609 Pain in unspecified limb: Secondary | ICD-10-CM

## 2013-02-10 DIAGNOSIS — E785 Hyperlipidemia, unspecified: Secondary | ICD-10-CM

## 2013-02-10 DIAGNOSIS — M7989 Other specified soft tissue disorders: Secondary | ICD-10-CM

## 2013-02-10 DIAGNOSIS — R29898 Other symptoms and signs involving the musculoskeletal system: Secondary | ICD-10-CM

## 2013-02-10 DIAGNOSIS — M79672 Pain in left foot: Secondary | ICD-10-CM

## 2013-02-10 DIAGNOSIS — G0489 Other myelitis: Secondary | ICD-10-CM

## 2013-02-10 DIAGNOSIS — I2510 Atherosclerotic heart disease of native coronary artery without angina pectoris: Secondary | ICD-10-CM

## 2013-02-10 DIAGNOSIS — I10 Essential (primary) hypertension: Secondary | ICD-10-CM

## 2013-02-10 DIAGNOSIS — M7120 Synovial cyst of popliteal space [Baker], unspecified knee: Secondary | ICD-10-CM | POA: Insufficient documentation

## 2013-02-10 DIAGNOSIS — C801 Malignant (primary) neoplasm, unspecified: Secondary | ICD-10-CM

## 2013-02-10 LAB — POCT CBC
Granulocyte percent: 48.5 %G (ref 37–80)
HCT, POC: 34.4 % — AB (ref 37.7–47.9)
Hemoglobin: 11.1 g/dL — AB (ref 12.2–16.2)
Lymph, poc: 2.9 (ref 0.6–3.4)
MCH, POC: 29.7 pg (ref 27–31.2)
MCHC: 32.2 g/dL (ref 31.8–35.4)
MCV: 92.2 fL (ref 80–97)
MPV: 6.9 fL (ref 0–99.8)
POC Granulocyte: 3.2 (ref 2–6.9)
POC LYMPH PERCENT: 43.4 %L (ref 10–50)
Platelet Count, POC: 436 10*3/uL — AB (ref 142–424)
RBC: 3.7 M/uL — AB (ref 4.04–5.48)
RDW, POC: 14.5 %
WBC: 6.7 10*3/uL (ref 4.6–10.2)

## 2013-02-10 NOTE — Progress Notes (Addendum)
Patient ID: Amy Black, female   DOB: [birth year], 73 y.o.   MRN: [id number] SUBJECTIVE: CC: Chief Complaint  Patient presents with  . Acute Visit    c/o left foot painful swollen     HPI: 1 week history of pain in the left foot at the ball of the left foot and swelling of both legs right worse than left. Has a hacky cough. No fever, no SOB. No chest pain.   Past Medical History  Diagnosis Date  . CAD (coronary artery disease)   . Irritable bowel syndrome   . Diverticulosis of colon (without mention of hemorrhage)   . hepatic cyst   . Hyperlipidemia   . Adenosquamous carcinoma     left leg 2004 - radiation & resection  . HTN (hypertension)     PT. DENIES  . History of cardiac catheterization   . Unstable angina   . Adenocarcinoma     LEFT LEG  . Insomnia   . Vitamin D deficiency   . Adenocarcinoma of unknown primary 02/06/2011  . History of nuclear stress test 04/2011    lexiscan; normal study, no significant ischemia, low risk    Past Surgical History  Procedure Laterality Date  . Resection of leg lesion and radiation  2004  . Diagnostic laparoscopy    . Extensive lysis of adhesions    . Left salpingo-oophorectomy    . Abdominal hysterectomy    . Pelvic laparoscopy  1992    RSO, AND LSO ON 2007  . Cardiac catheterization      coronary spasm  . Transthoracic echocardiogram  07/2012    LV cavity size mildly reduced, normal wall motion; MV with calcified annulus and mild MR; LA mildly dilated; atrial septum with increased thickness - lipomatous hypertrophy; RV systolic pressure increased (borderline pulm HTN)   History   Social History  . Marital Status: Married    Spouse Name: N/A    Number of Children: 2  . Years of Education: N/A   Occupational History  . Disabilty   . DISABILITY    Social History Main Topics  . Smoking status: Former Smoker    Types: Cigarettes    Quit date: 11/20/1972  . Smokeless tobacco: Never Used  . Alcohol Use: No  . Drug Use:  No  . Sexual Activity: No   Other Topics Concern  . Not on file   Social History Narrative  . No narrative on file   Family History  Problem Relation Age of Onset  . Diabetes      Multiple family members on both sides   . Coronary artery disease      Multiple family members on both sides   . Cancer Mother     Bladder  . Heart disease Mother   . Hypertension Mother   . Colon cancer Neg Hx   . Heart disease Father   . Stroke Father    Current Outpatient Prescriptions on File Prior to Visit  Medication Sig Dispense Refill  . ALPRAZolam (XANAX) 0.5 MG tablet TAKE ONE TABLET BY MOUTH THREE TIMES DAILY  90 tablet  0  . aspirin 81 MG tablet Take 1 tablet (81 mg total) by mouth daily.  30 tablet    . baclofen (LIORESAL) 10 MG tablet Take 10 mg by mouth 3 (three) times daily.       . diphenoxylate-atropine (LOMOTIL) 2.5-0.025 MG per tablet Take 1 tablet by mouth 4 (four) times daily.  120 tablet  5  .  gabapentin (NEURONTIN) 100 MG capsule Take 100 mg by mouth at bedtime.       . nitroGLYCERIN (NITROSTAT) 0.4 MG SL tablet Place 1 tablet (0.4 mg total) under the tongue every 5 (five) minutes as needed. For chest pain  25 tablet  3  . pantoprazole (PROTONIX) 40 MG tablet Take 40 mg by mouth 2 (two) times daily.       . traMADol (ULTRAM) 50 MG tablet Take 1 tablet (50 mg total) by mouth as needed.  30 tablet  0  . triamterene-hydrochlorothiazide (DYAZIDE) 37.5-25 MG per capsule Take 1 each (1 capsule total) by mouth every Monday, Wednesday, and Friday.  30 capsule  11  . verapamil (CALAN) 80 MG tablet Take 1 tablet (80 mg total) by mouth 4 (four) times daily - after meals and at bedtime.  90 tablet  0   No current facility-administered medications on file prior to visit.   Allergies  Allergen Reactions  . Iodine     Itching    Immunization History  Administered Date(s) Administered  . Pneumococcal Polysaccharide-23 02/07/2000  . Tdap 02/06/2002  . Zoster 02/06/2010   Prior to  Admission medications   Medication Sig Start Date End Date Taking? Authorizing Provider  ALPRAZolam Duanne Moron) 0.5 MG tablet TAKE ONE TABLET BY MOUTH THREE TIMES DAILY 01/20/13  Yes Vernie Shanks, MD  aspirin 81 MG tablet Take 1 tablet (81 mg total) by mouth daily. 02/16/12  Yes Daniel J Angiulli, PA-C  baclofen (LIORESAL) 10 MG tablet Take 10 mg by mouth 3 (three) times daily.  07/27/12  Yes Historical Provider, MD  diphenoxylate-atropine (LOMOTIL) 2.5-0.025 MG per tablet Take 1 tablet by mouth 4 (four) times daily. 10/22/12  Yes Lysbeth Penner, FNP  gabapentin (NEURONTIN) 100 MG capsule Take 100 mg by mouth at bedtime.  01/16/13  Yes Historical Provider, MD  nitroGLYCERIN (NITROSTAT) 0.4 MG SL tablet Place 1 tablet (0.4 mg total) under the tongue every 5 (five) minutes as needed. For chest pain 01/08/13  Yes Pixie Casino, MD  pantoprazole (PROTONIX) 40 MG tablet Take 40 mg by mouth 2 (two) times daily.  05/15/12  Yes Historical Provider, MD  traMADol (ULTRAM) 50 MG tablet Take 1 tablet (50 mg total) by mouth as needed. 12/03/12  Yes Vernie Shanks, MD  triamterene-hydrochlorothiazide (DYAZIDE) 37.5-25 MG per capsule Take 1 each (1 capsule total) by mouth every Monday, Wednesday, and Friday. 05/28/12  Yes Vernie Shanks, MD  verapamil (CALAN) 80 MG tablet Take 1 tablet (80 mg total) by mouth 4 (four) times daily - after meals and at bedtime. 02/16/12  Yes Daniel J Angiulli, PA-C     ROS: As above in the HPI. All other systems are stable or negative.  OBJECTIVE: APPEARANCE:  Patient in no acute distress.The patient appeared well nourished and normally developed. Acyanotic. Waist: VITAL SIGNS:BP 115/71  Pulse 77  Temp(Src) 97.8 F (36.6 C) (Oral)  Ht $R'5\' 3"'Wd$  (1.6 m)  Wt 162 lb 9.6 oz (73.755 kg)  BMI 28.81 kg/m2  SpO2 99% AAF   SKIN: warm and  Dry without overt rashes, tattoos and scars  HEAD and Neck: without JVD, Head and scalp: normal Eyes:No scleral icterus. Fundi normal, eye  movements normal. Ears: Auricle normal, canal normal, Tympanic membranes normal, insufflation normal. Nose: normal Throat: normal Neck & thyroid: normal  CHEST & LUNGS: Chest wall: normal Lungs: Clear  CVS: Reveals the PMI to be normally located. Regular rhythm, First and Second Heart sounds are  normal,  absence of murmurs, rubs or gallops. Peripheral vasculature: Radial pulses: normal Dorsal pedis pulses: normal Posterior pulses: normal  ABDOMEN:  Appearance: normal Benign, no organomegaly, no masses, no Abdominal Aortic enlargement. No Guarding , no rebound. No Bruits. Bowel sounds: normal  RECTAL: N/A GU: N/A  EXTREMETIES: edematous.1+ on the left and 2+ on the right. Tenderness of the ball of the left foot.  MUSCULOSKELETAL:  Spine: normal Joints: intact  NEUROLOGIC: oriented to time,place and person; nonfocal.no change in the right foot drop. Results for orders placed in visit on 02/10/13  POCT CBC      Result Value Range   WBC 6.7  4.6 - 10.2 K/uL   Lymph, poc 2.9  0.6 - 3.4   POC LYMPH PERCENT 43.4  10 - 50 %L   POC Granulocyte 3.2  2 - 6.9   Granulocyte percent 48.5  37 - 80 %G   RBC 3.7 (*) 4.04 - 5.48 M/uL   Hemoglobin 11.1 (*) 12.2 - 16.2 g/dL   HCT, POC 34.4 (*) 37.7 - 47.9 %   MCV 92.2  80 - 97 fL   MCH, POC 29.7  27 - 31.2 pg   MCHC 32.2  31.8 - 35.4 g/dL   RDW, POC 14.5     Platelet Count, POC 436.0 (*) 142 - 424 K/uL   MPV 6.9  0 - 99.8 fL    ASSESSMENT: Pain in left foot - Plan: DG Foot Complete Left, POCT CBC, Brain natriuretic peptide, CMP14+EGFR, Uric acid, US Venous Img Lower Bilateral, CANCELED: US Venous Img Lower Bilateral  Swollen leg - Plan: Brain natriuretic peptide, US Venous Img Lower Bilateral, CANCELED: US Venous Img Lower Bilateral  Transverse myelitis  Weakness of right leg  CAD (coronary artery disease)  HLD (hyperlipidemia)  HTN (hypertension)  Adenocarcinoma of unknown primary  PLAN:  Orders Placed This  Encounter  Procedures  . DG Foot Complete Left    Standing Status: Future     Number of Occurrences: 1     Standing Expiration Date: 04/11/2014    Order Specific Question:  Reason for Exam (SYMPTOM  OR DIAGNOSIS REQUIRED)    Answer:  pain    Order Specific Question:  Preferred imaging location?    Answer:  Internal  . US Venous Img Lower Bilateral    Standing Status: Future     Number of Occurrences: 1     Standing Expiration Date: 04/11/2014    Order Specific Question:  Reason for Exam (SYMPTOM  OR DIAGNOSIS REQUIRED)    Answer:  left foot pain and acute bilateral leg swelling left worse than right. transverse myelitis.    Order Specific Question:  Preferred imaging location?    Answer:  Bronte natriuretic peptide  . CMP14+EGFR  . Uric acid  . POCT CBC  [facility] reading (PRIMARY) by  Dr. Jacelyn Grip: left foot. Cortical thickening of the 2 nd 3 rd and 4 th metatarsals. No obvious fracture seen.                              Await U/s to r/o DVT.  No orders of the defined types were placed in this encounter.   There are no discontinued medications. Return in about 3 days (around 02/13/2013) for Recheck medical problems.  Mikalah Skyles P. Jacelyn Grip, M.D.   Addendum: CLINICAL DATA: Pain and swelling in both legs.  EXAM:  Bilateral LOWER EXTREMITY VENOUS  DOPPLER ULTRASOUND  TECHNIQUE:  Gray-scale sonography with graded compression, as well as color  Doppler and duplex ultrasound, were performed to evaluate the deep  venous system from the level of the common femoral vein through the  popliteal and proximal calf veins. Spectral Doppler was utilized to  evaluate flow at rest and with distal augmentation maneuvers.  COMPARISON: None.  FINDINGS:  Thrombus within deep veins: None visualized.  Compressibility of deep veins: Normal.  Duplex waveform respiratory phasicity: Normal.  Duplex waveform response to augmentation: Normal.  Venous reflux: None visualized.  Other findings: 5  cm long axis fluid collection is present in the  left popliteal fossa most consistent with a Baker's cyst.  IMPRESSION:  1. Negative for bilateral lower extremity DVT.  2. Left popliteal fossa cyst, consistent with Baker's cyst.  Electronically Signed  By: Dereck Ligas M.D.  On: 02/10/2013 15:47   Plan  Keep follow up as planned.  Toniann Dickerson P. Jacelyn Grip, M.D.

## 2013-02-11 LAB — CMP14+EGFR
ALT: 9 IU/L (ref 0–32)
AST: 13 IU/L (ref 0–40)
Albumin/Globulin Ratio: 1.5 (ref 1.1–2.5)
Albumin: 4.3 g/dL (ref 3.5–4.8)
Alkaline Phosphatase: 102 IU/L (ref 39–117)
BUN/Creatinine Ratio: 9 — ABNORMAL LOW (ref 11–26)
BUN: 8 mg/dL (ref 8–27)
CO2: 27 mmol/L (ref 18–29)
Calcium: 10.2 mg/dL (ref 8.6–10.2)
Chloride: 100 mmol/L (ref 97–108)
Creatinine, Ser: 0.85 mg/dL (ref 0.57–1.00)
GFR calc Af Amer: 80 mL/min/{1.73_m2} (ref >59)
GFR calc non Af Amer: 70 mL/min/{1.73_m2} (ref >59)
Globulin, Total: 2.8 g/dL (ref 1.5–4.5)
Glucose: 87 mg/dL (ref 65–99)
Potassium: 4.8 mmol/L (ref 3.5–5.2)
Sodium: 141 mmol/L (ref 134–144)
Total Bilirubin: 0.4 mg/dL (ref 0.0–1.2)
Total Protein: 7.1 g/dL (ref 6.0–8.5)

## 2013-02-11 LAB — URIC ACID: Uric Acid: 4.4 mg/dL (ref 2.5–7.1)

## 2013-02-11 LAB — BRAIN NATRIURETIC PEPTIDE

## 2013-02-13 ENCOUNTER — Ambulatory Visit: Payer: Medicare Other | Admitting: Family Medicine

## 2013-02-13 ENCOUNTER — Encounter: Payer: Self-pay | Admitting: Family Medicine

## 2013-02-13 ENCOUNTER — Ambulatory Visit: Payer: Medicare Other | Admitting: Internal Medicine

## 2013-02-13 ENCOUNTER — Ambulatory Visit (INDEPENDENT_AMBULATORY_CARE_PROVIDER_SITE_OTHER): Payer: Medicare Other | Admitting: Family Medicine

## 2013-02-13 VITALS — BP 109/69 | HR 67 | Temp 97.7°F | Ht 63.0 in | Wt 162.4 lb

## 2013-02-13 DIAGNOSIS — M25572 Pain in left ankle and joints of left foot: Secondary | ICD-10-CM

## 2013-02-13 DIAGNOSIS — J310 Chronic rhinitis: Secondary | ICD-10-CM

## 2013-02-13 DIAGNOSIS — R609 Edema, unspecified: Secondary | ICD-10-CM

## 2013-02-13 DIAGNOSIS — R600 Localized edema: Secondary | ICD-10-CM

## 2013-02-13 DIAGNOSIS — M7122 Synovial cyst of popliteal space [Baker], left knee: Secondary | ICD-10-CM

## 2013-02-13 DIAGNOSIS — M7120 Synovial cyst of popliteal space [Baker], unspecified knee: Secondary | ICD-10-CM

## 2013-02-13 DIAGNOSIS — M25579 Pain in unspecified ankle and joints of unspecified foot: Secondary | ICD-10-CM

## 2013-02-13 NOTE — Progress Notes (Addendum)
Patient ID: Amy Black, female   DOB: 04-Jun-1940, 73 y.o.   MRN: [id number] SUBJECTIVE: CC: Chief Complaint  Patient presents with  . Follow-up    reck left foot     HPI: Had swelling of the legs and pain in the left foot. Had U/s to r/o DVT and that was negative.swelling and pain spontaneously resolved.  Past Medical History  Diagnosis Date  . CAD (coronary artery disease)   . Irritable bowel syndrome   . Diverticulosis of colon (without mention of hemorrhage)   . hepatic cyst   . Hyperlipidemia   . Adenosquamous carcinoma     left leg 2004 - radiation & resection  . HTN (hypertension)     PT. DENIES  . History of cardiac catheterization   . Unstable angina   . Adenocarcinoma     LEFT LEG  . Insomnia   . Vitamin D deficiency   . Adenocarcinoma of unknown primary 02/06/2011  . History of nuclear stress test 04/2011    lexiscan; normal study, no significant ischemia, low risk    Past Surgical History  Procedure Laterality Date  . Resection of leg lesion and radiation  2004  . Diagnostic laparoscopy    . Extensive lysis of adhesions    . Left salpingo-oophorectomy    . Abdominal hysterectomy    . Pelvic laparoscopy  1992    RSO, AND LSO ON 2007  . Cardiac catheterization      coronary spasm  . Transthoracic echocardiogram  07/2012    LV cavity size mildly reduced, normal wall motion; MV with calcified annulus and mild MR; LA mildly dilated; atrial septum with increased thickness - lipomatous hypertrophy; RV systolic pressure increased (borderline pulm HTN)   History   Social History  . Marital Status: Married    Spouse Name: N/A    Number of Children: 2  . Years of Education: N/A   Occupational History  . Disabilty   . DISABILITY    Social History Main Topics  . Smoking status: Former Smoker    Types: Cigarettes    Quit date: 11/20/1972  . Smokeless tobacco: Never Used  . Alcohol Use: No  . Drug Use: No  . Sexual Activity: No   Other Topics Concern   . Not on file   Social History Narrative  . No narrative on file   Family History  Problem Relation Age of Onset  . Diabetes      Multiple family members on both sides   . Coronary artery disease      Multiple family members on both sides   . Cancer Mother     Bladder  . Heart disease Mother   . Hypertension Mother   . Colon cancer Neg Hx   . Heart disease Father   . Stroke Father    Current Outpatient Prescriptions on File Prior to Visit  Medication Sig Dispense Refill  . ALPRAZolam (XANAX) 0.5 MG tablet TAKE ONE TABLET BY MOUTH THREE TIMES DAILY  90 tablet  0  . aspirin 81 MG tablet Take 1 tablet (81 mg total) by mouth daily.  30 tablet    . baclofen (LIORESAL) 10 MG tablet Take 10 mg by mouth 3 (three) times daily.       . diphenoxylate-atropine (LOMOTIL) 2.5-0.025 MG per tablet Take 1 tablet by mouth 4 (four) times daily.  120 tablet  5  . gabapentin (NEURONTIN) 100 MG capsule Take 100 mg by mouth at bedtime.       Marland Kitchen  nitroGLYCERIN (NITROSTAT) 0.4 MG SL tablet Place 1 tablet (0.4 mg total) under the tongue every 5 (five) minutes as needed. For chest pain  25 tablet  3  . pantoprazole (PROTONIX) 40 MG tablet Take 40 mg by mouth 2 (two) times daily.       . traMADol (ULTRAM) 50 MG tablet Take 1 tablet (50 mg total) by mouth as needed.  30 tablet  0  . triamterene-hydrochlorothiazide (DYAZIDE) 37.5-25 MG per capsule Take 1 each (1 capsule total) by mouth every Monday, Wednesday, and Friday.  30 capsule  11  . verapamil (CALAN) 80 MG tablet Take 1 tablet (80 mg total) by mouth 4 (four) times daily - after meals and at bedtime.  90 tablet  0   No current facility-administered medications on file prior to visit.   Allergies  Allergen Reactions  . Iodine     Itching    Immunization History  Administered Date(s) Administered  . Pneumococcal Polysaccharide-23 02/07/2000  . Tdap 02/06/2002  . Zoster 02/06/2010   Prior to Admission medications   Medication Sig Start Date End  Date Taking? Authorizing Provider  ALPRAZolam Duanne Moron) 0.5 MG tablet TAKE ONE TABLET BY MOUTH THREE TIMES DAILY 01/20/13  Yes Vernie Shanks, MD  aspirin 81 MG tablet Take 1 tablet (81 mg total) by mouth daily. 02/16/12  Yes Daniel J Angiulli, PA-C  baclofen (LIORESAL) 10 MG tablet Take 10 mg by mouth 3 (three) times daily.  07/27/12  Yes Historical Provider, MD  diphenoxylate-atropine (LOMOTIL) 2.5-0.025 MG per tablet Take 1 tablet by mouth 4 (four) times daily. 10/22/12  Yes Lysbeth Penner, FNP  gabapentin (NEURONTIN) 100 MG capsule Take 100 mg by mouth at bedtime.  01/16/13  Yes Historical Provider, MD  nitroGLYCERIN (NITROSTAT) 0.4 MG SL tablet Place 1 tablet (0.4 mg total) under the tongue every 5 (five) minutes as needed. For chest pain 01/08/13  Yes Pixie Casino, MD  pantoprazole (PROTONIX) 40 MG tablet Take 40 mg by mouth 2 (two) times daily.  05/15/12  Yes Historical Provider, MD  traMADol (ULTRAM) 50 MG tablet Take 1 tablet (50 mg total) by mouth as needed. 12/03/12  Yes Vernie Shanks, MD  triamterene-hydrochlorothiazide (DYAZIDE) 37.5-25 MG per capsule Take 1 each (1 capsule total) by mouth every Monday, Wednesday, and Friday. 05/28/12  Yes Vernie Shanks, MD  verapamil (CALAN) 80 MG tablet Take 1 tablet (80 mg total) by mouth 4 (four) times daily - after meals and at bedtime. 02/16/12  Yes Daniel J Angiulli, PA-C     ROS: As above in the HPI. All other systems are stable or negative.  OBJECTIVE: APPEARANCE:  Patient in no acute distress.The patient appeared well nourished and normally developed. Acyanotic. Waist: VITAL SIGNS:BP 109/69  Pulse 67  Temp(Src) 97.7 F (36.5 C) (Oral)  Ht 5\' 3"  (1.6 m)  Wt 162 lb 6.4 oz (73.664 kg)  BMI 28.78 kg/m2 AAF  SKIN: warm and  Dry without overt rashes, tattoos and scars  HEAD and Neck: without JVD, Head and scalp: normal Eyes:No scleral icterus. Fundi normal, eye movements normal. Ears: Auricle normal, canal normal, Tympanic membranes  normal, insufflation normal. Nose: nasal congestion. Throat: normal Neck & thyroid: normal  CHEST & LUNGS: Chest wall: normal Lungs: Clear  CVS: Reveals the PMI to be normally located. Regular rhythm, First and Second Heart sounds are normal,  absence of murmurs, rubs or gallops. Peripheral vasculature: Radial pulses: normal Dorsal pedis pulses: normal Posterior pulses: normal  ABDOMEN:  Appearance: normal Benign, no organomegaly, no masses, no Abdominal Aortic enlargement. No Guarding , no rebound. No Bruits. Bowel sounds: normal  RECTAL: N/A GU: N/A  EXTREMETIES: nonedematous.no pain  MUSCULOSKELETAL:  Spine: normal Joints: intact  NEUROLOGIC: oriented to time,place and person; nonfocal.no change in right foot drop.  ASSESSMENT:  Pedal edema - resolved  Pain in joint, ankle and foot, left - resolved  Baker's cyst of knee, left  Rhinitis  PLAN:  No orders of the defined types were placed in this encounter.   Meds ordered this encounter  Medications  . fluticasone (FLONASE) 50 MCG/ACT nasal spray    Sig: Place 2 sprays into both nostrils daily.  observe for now.  There are no discontinued medications. Return in about 3 months (around 05/14/2013) for Recheck medical problems.  Jantzen Pilger P. Jacelyn Grip, M.D.

## 2013-02-14 ENCOUNTER — Telehealth: Payer: Self-pay | Admitting: Family Medicine

## 2013-02-14 DIAGNOSIS — M7120 Synovial cyst of popliteal space [Baker], unspecified knee: Secondary | ICD-10-CM | POA: Insufficient documentation

## 2013-02-14 DIAGNOSIS — R600 Localized edema: Secondary | ICD-10-CM | POA: Insufficient documentation

## 2013-02-14 DIAGNOSIS — M25579 Pain in unspecified ankle and joints of unspecified foot: Secondary | ICD-10-CM | POA: Insufficient documentation

## 2013-02-14 DIAGNOSIS — J310 Chronic rhinitis: Secondary | ICD-10-CM | POA: Insufficient documentation

## 2013-02-14 NOTE — Telephone Encounter (Signed)
Pt aware dr Jacelyn Grip will be back Monday and would like to go get started on therapy

## 2013-02-16 ENCOUNTER — Other Ambulatory Visit: Payer: Self-pay | Admitting: Family Medicine

## 2013-02-16 DIAGNOSIS — M21371 Foot drop, right foot: Secondary | ICD-10-CM

## 2013-02-16 NOTE — Telephone Encounter (Signed)
Referral done in [facility].

## 2013-02-21 ENCOUNTER — Ambulatory Visit: Payer: Medicare Other | Admitting: Family Medicine

## 2013-02-25 ENCOUNTER — Ambulatory Visit: Payer: Medicare Other | Admitting: Gastroenterology

## 2013-02-27 ENCOUNTER — Telehealth: Payer: Self-pay | Admitting: Family Medicine

## 2013-02-28 ENCOUNTER — Other Ambulatory Visit: Payer: Self-pay | Admitting: Family Medicine

## 2013-03-03 ENCOUNTER — Telehealth: Payer: Self-pay | Admitting: Family Medicine

## 2013-03-03 NOTE — Telephone Encounter (Signed)
Patient last seen in office on 02-13-13. Rx last filled on 01-20-13. Please advise. If approved please route to Pool A so nurse can call in to pharmacy

## 2013-03-04 ENCOUNTER — Ambulatory Visit: Payer: Medicare Other | Admitting: Physical Therapy

## 2013-03-04 DIAGNOSIS — Z0289 Encounter for other administrative examinations: Secondary | ICD-10-CM

## 2013-03-04 NOTE — Telephone Encounter (Signed)
Rx ready for nurse to Phone in. 

## 2013-03-04 NOTE — Telephone Encounter (Signed)
Called to [facility].

## 2013-03-06 ENCOUNTER — Encounter: Payer: Medicare Other | Admitting: Physical Therapy

## 2013-03-06 ENCOUNTER — Ambulatory Visit: Payer: Medicare Other | Admitting: Physical Therapy

## 2013-03-07 ENCOUNTER — Telehealth: Payer: Self-pay | Admitting: Family Medicine

## 2013-03-07 NOTE — Telephone Encounter (Signed)
Done by [facility]-- CMA

## 2013-03-07 NOTE — Telephone Encounter (Signed)
Pt notifed and advised to let Dr Catalina Gravel notify [facility] for those records . Pt verbalized understanding

## 2013-03-07 NOTE — Telephone Encounter (Signed)
SPOKE TO PT WANTS NEUROLOGY PAPERS SENT TO DR Hinda Lenis NUMBER [phone]

## 2013-03-07 NOTE — Telephone Encounter (Signed)
Received 3 pages from [facility], [facility], sent to Dr. Yaakov Guthrie @ [facility]. 03/07/13/ss

## 2013-03-11 ENCOUNTER — Ambulatory Visit: Payer: Medicare Other | Admitting: Cardiology

## 2013-03-12 ENCOUNTER — Ambulatory Visit (INDEPENDENT_AMBULATORY_CARE_PROVIDER_SITE_OTHER): Payer: Medicare Other

## 2013-03-12 ENCOUNTER — Encounter: Payer: Self-pay | Admitting: Family Medicine

## 2013-03-12 ENCOUNTER — Ambulatory Visit (INDEPENDENT_AMBULATORY_CARE_PROVIDER_SITE_OTHER): Payer: Medicare Other | Admitting: Family Medicine

## 2013-03-12 VITALS — BP 113/69 | HR 63 | Temp 98.2°F | Ht 63.0 in | Wt 158.0 lb

## 2013-03-12 DIAGNOSIS — M79609 Pain in unspecified limb: Secondary | ICD-10-CM

## 2013-03-12 DIAGNOSIS — M79676 Pain in unspecified toe(s): Secondary | ICD-10-CM

## 2013-03-12 MED ORDER — MELOXICAM 15 MG PO TABS: 15.0000 mg | ORAL_TABLET | Freq: Every day | ORAL | Status: DC

## 2013-03-12 NOTE — Progress Notes (Signed)
   Subjective:    Patient ID: Amy Black, female    DOB: 10/22/1940, 73 y.o.   MRN: [id number]  HPI  This 73 y.o. female presents for evaluation of left great toe pain x 3 days and no known injury.  Review of Systems C/o left great toe pain   No chest pain, SOB, HA, dizziness, vision change, N/V, diarrhea, constipation, dysuria, urinary urgency or frequency or rash Objective:   Physical Exam Vital signs noted  Well developed well nourished female.  Left first toe with halux valgus and TTP  No swelling and No erythema  Xray - Left toe w/o fracture       Assessment & Plan:  Pain of great toe - Plan: DG Foot Complete Left, meloxicam (MOBIC) 15 MG tablet  Lysbeth Penner FNP

## 2013-03-13 NOTE — Patient Instructions (Signed)

## 2013-03-17 ENCOUNTER — Ambulatory Visit: Payer: Medicare Other | Admitting: Physician Assistant

## 2013-03-21 ENCOUNTER — Encounter: Payer: Self-pay | Admitting: *Deleted

## 2013-03-24 ENCOUNTER — Ambulatory Visit: Payer: Medicare Other | Admitting: Internal Medicine

## 2013-04-05 ENCOUNTER — Other Ambulatory Visit: Payer: Self-pay | Admitting: Family Medicine

## 2013-04-07 ENCOUNTER — Ambulatory Visit (INDEPENDENT_AMBULATORY_CARE_PROVIDER_SITE_OTHER): Payer: Medicare Other | Admitting: Internal Medicine

## 2013-04-07 ENCOUNTER — Encounter: Payer: Self-pay | Admitting: Internal Medicine

## 2013-04-07 VITALS — BP 132/80 | HR 70 | Ht 63.5 in | Wt 158.5 lb

## 2013-04-07 DIAGNOSIS — K219 Gastro-esophageal reflux disease without esophagitis: Secondary | ICD-10-CM

## 2013-04-07 DIAGNOSIS — F411 Generalized anxiety disorder: Secondary | ICD-10-CM

## 2013-04-07 DIAGNOSIS — I2510 Atherosclerotic heart disease of native coronary artery without angina pectoris: Secondary | ICD-10-CM

## 2013-04-07 DIAGNOSIS — I200 Unstable angina: Secondary | ICD-10-CM

## 2013-04-07 DIAGNOSIS — F419 Anxiety disorder, unspecified: Secondary | ICD-10-CM

## 2013-04-07 MED ORDER — TRIAMTERENE-HCTZ 37.5-25 MG PO CAPS: 1.0000 | ORAL_CAPSULE | ORAL | Status: DC

## 2013-04-07 MED ORDER — VERAPAMIL HCL 80 MG PO TABS: 80.0000 mg | ORAL_TABLET | Freq: Three times a day (TID) | ORAL | Status: DC

## 2013-04-07 MED ORDER — NITROGLYCERIN 0.4 MG SL SUBL: 0.4000 mg | SUBLINGUAL_TABLET | SUBLINGUAL | Status: DC | PRN

## 2013-04-07 NOTE — Patient Instructions (Addendum)
Your medicines have been sent to your pharmacy.  [facility] - [phone] - [address]  Dr Debara Pickett wants you to follow-up in 1 year. You will receive a reminder letter in the mail two months in advance. If you don't receive a letter, please call our office to schedule the follow-up appointment.

## 2013-04-07 NOTE — Progress Notes (Signed)
OFFICE NOTE  Chief Complaint:  Routine follow-up  Primary Care Physician: Anthoney Harada, MD  HPI:  Amy Black is a very pleasant 73 year old female followed by Dr. Rollene Fare since the 1980s. She has a history of chest pain in the past and a remote cardiac catheterization in 1970s with ergotamine provocation which was positive, suggesting coronary spasm. Since then she has done well with chronic calcium channel blockers, especially verapamil 4 times daily. Unfortunately, she had a recent acute episode of transverse myelitis which she noted after a car ride and getting out she had some right foot drag. She had an MRI which showed a non-expansile signal in the right hemicord at T11-T12 with patchy enhancement and sparing of the conus. She was then referred to [facility] neurology to see Dr. Diamantina Monks who confirmed the diagnosis of transverse myelitis, without a clear etiology. Since then she's undergone rehabilitation and is being fitted for a brace. Her insurance no longer covers out of network care at [facility] therefore she is awaiting an appointment with a local neurologist. She has been having pain in her right leg mostly at night when she lays flat which is improved somewhat with tramadol. Apparently she is failed gabapentin in the past and occasionally takes muscle relaxants. She also has dyslipidemia but is well-controlled on low-dose Lipitor. She had a echocardiogram which was essentially normal except for mild mitral regurgitation and April of 2014. She had a stress test in 2013 which was negative as well.  [name] returns today for followup. She reports that in December she had an episode of chest pain and took 3 nitroglycerin. This occurred at church and she took some pain medicine and went to bed. Her symptoms did resolve. She's had episodes like this at least once or twice a year for years and this does not represent a pattern of increase in her symptoms.  PMHx:  Past Medical  History  Diagnosis Date  . CAD (coronary artery disease)   . Irritable bowel syndrome   . Diverticulosis of colon (without mention of hemorrhage)   . hepatic cyst   . Hyperlipidemia   . Adenosquamous carcinoma     left leg 2004 - radiation & resection  . HTN (hypertension)     PT. DENIES  . History of cardiac catheterization   . Unstable angina   . Adenocarcinoma     LEFT LEG  . Insomnia   . Vitamin D deficiency   . Adenocarcinoma of unknown primary 02/06/2011  . History of nuclear stress test 04/2011    lexiscan; normal study, no significant ischemia, low risk   . Coronary artery spasm     Past Surgical History  Procedure Laterality Date  . Resection soft tissue tumor leg / ankle radical  2004    leg lesion resection & radiation  . Diagnostic laparoscopy    . Lysis of adhesion    . Salpingoophorectomy Left   . Abdominal hysterectomy    . Pelvic laparoscopy  1992    RSO, AND LSO ON 2007  . Cardiac catheterization      coronary spasm  . Transthoracic echocardiogram  07/2012    LV cavity size mildly reduced, normal wall motion; MV with calcified annulus and mild MR; LA mildly dilated; atrial septum with increased thickness - lipomatous hypertrophy; RV systolic pressure increased (borderline pulm HTN)    FAMHx:  Family History  Problem Relation Age of Onset  . Diabetes      Multiple family  members on both sides   . Coronary artery disease      Multiple family members on both sides   . Cancer Mother     Bladder  . Heart disease Mother   . Hypertension Mother   . Colon cancer Neg Hx   . Heart disease Father   . Stroke Father     SOCHx:   reports that she quit smoking about 40 years ago. Her smoking use included Cigarettes. She smoked 0.00 packs per day. She has never used smokeless tobacco. She reports that she does not drink alcohol or use illicit drugs.  ALLERGIES:  Allergies  Allergen Reactions  . Iodine     Itching     ROS: A comprehensive review of  systems was negative except for: Cardiovascular: positive for chest pain Musculoskeletal: positive for back pain, muscle weakness and myalgias Neurological: positive for weakness and foot drop  HOME MEDS: Current Outpatient Prescriptions  Medication Sig Dispense Refill  . ALPRAZolam (XANAX) 0.5 MG tablet TAKE ONE TABLET BY MOUTH THREE TIMES DAILY  90 tablet  0  . aspirin 81 MG tablet Take 1 tablet (81 mg total) by mouth daily.  30 tablet    . diphenoxylate-atropine (LOMOTIL) 2.5-0.025 MG per tablet Take 1 tablet by mouth 4 (four) times daily.  120 tablet  5  . fluticasone (FLONASE) 50 MCG/ACT nasal spray Place 2 sprays into both nostrils daily.      . meloxicam (MOBIC) 15 MG tablet Take 15 mg by mouth daily as needed.      . nitroGLYCERIN (NITROSTAT) 0.4 MG SL tablet Place 1 tablet (0.4 mg total) under the tongue every 5 (five) minutes as needed. For chest pain  25 tablet  3  . pantoprazole (PROTONIX) 40 MG tablet Take 40 mg by mouth 2 (two) times daily.       . traMADol (ULTRAM) 50 MG tablet Take 1 tablet (50 mg total) by mouth as needed.  30 tablet  0  . triamterene-hydrochlorothiazide (DYAZIDE) 37.5-25 MG per capsule Take 1 each (1 capsule total) by mouth every Monday, Wednesday, and Friday.  90 capsule  3  . verapamil (CALAN) 80 MG tablet Take 1 tablet (80 mg total) by mouth 4 (four) times daily - after meals and at bedtime.  360 tablet  3   No current facility-administered medications for this visit.    LABS/IMAGING: No results found for this or any previous visit (from the past 48 hour(s)). No results found.  VITALS: BP 132/80  Pulse 70  Ht 5' 3.5" (1.613 m)  Wt 158 lb 8 oz (71.895 kg)  BMI 27.63 kg/m2  EXAM: General appearance: alert and no distress Neck: no adenopathy, no carotid bruit, no JVD, supple, symmetrical, trachea midline and thyroid not enlarged, symmetric, no tenderness/mass/nodules Lungs: clear to auscultation bilaterally Heart: regular rate and rhythm, S1, S2  normal, 2/6 systolic murmur at apex, no click, rub or gallop Abdomen: soft, non-tender; bowel sounds normal; no masses,  no organomegaly Extremities: extremities normal, atraumatic, no cyanosis or edema Pulses: 2+ and symmetric Skin: Skin color, texture, turgor normal. No rashes or lesions Neurologic: Mental status: Alert, oriented, thought content appropriate, there is decreased extensor strength in the right foot, walks with cane Psych: Very pleasant mood, normal affect  EKG: Normal sinus rhythm at 70  ASSESSMENT: 1. Chest pain with history of coronary spasm-well controlled with occasional anginal episodes 2. Hypertension-at goal 3. Recent transverse myelitis of unknown etiology, on aspirin 4. Dyslipidemia-at goal on low-dose  atorvastatin 5. Anxiety  PLAN: 1.   Mrs. Krabbenhoft is doing very well with infrequent occurrences of coronary spasm. They all do seem to respond to nitrates and pain medicine. She also has some anxiety which may be contributing to that. I recommend continuing her current medications at this time we'll plan to see her back annually or sooner as necessary.  Pixie Casino, MD, [facility] Attending Cardiologist [facility]  Makaylie Dedeaux C 04/07/2013, 4:23 PM

## 2013-04-08 ENCOUNTER — Telehealth: Payer: Self-pay | Admitting: Family Medicine

## 2013-04-08 DIAGNOSIS — G373 Acute transverse myelitis in demyelinating disease of central nervous system: Secondary | ICD-10-CM

## 2013-04-08 NOTE — Telephone Encounter (Signed)
Last seen 02/13/13, last filled 02/28/13. Route to nurse to call into [place] [phone]

## 2013-04-08 NOTE — Telephone Encounter (Signed)
Rx ready for nurse to Phone in. 

## 2013-04-09 ENCOUNTER — Other Ambulatory Visit: Payer: Self-pay | Admitting: Family Medicine

## 2013-04-10 NOTE — Telephone Encounter (Signed)
Last seen 2/4/145  JWO  If approved route to nurse to call into [place]

## 2013-04-10 NOTE — Telephone Encounter (Signed)
Called in.

## 2013-04-12 ENCOUNTER — Ambulatory Visit (INDEPENDENT_AMBULATORY_CARE_PROVIDER_SITE_OTHER): Payer: Medicare Other | Admitting: Family Medicine

## 2013-04-12 VITALS — BP 108/67 | HR 102 | Temp 99.3°F | Ht 63.5 in | Wt 156.0 lb

## 2013-04-12 DIAGNOSIS — R059 Cough, unspecified: Secondary | ICD-10-CM

## 2013-04-12 DIAGNOSIS — I2510 Atherosclerotic heart disease of native coronary artery without angina pectoris: Secondary | ICD-10-CM

## 2013-04-12 DIAGNOSIS — J3489 Other specified disorders of nose and nasal sinuses: Secondary | ICD-10-CM

## 2013-04-12 DIAGNOSIS — J029 Acute pharyngitis, unspecified: Secondary | ICD-10-CM

## 2013-04-12 DIAGNOSIS — R0981 Nasal congestion: Secondary | ICD-10-CM

## 2013-04-12 DIAGNOSIS — R05 Cough: Secondary | ICD-10-CM

## 2013-04-12 DIAGNOSIS — J40 Bronchitis, not specified as acute or chronic: Secondary | ICD-10-CM

## 2013-04-12 LAB — POCT INFLUENZA A/B
Influenza A, POC: NEGATIVE
Influenza B, POC: NEGATIVE

## 2013-04-12 LAB — POCT RAPID STREP A (OFFICE): Rapid Strep A Screen: NEGATIVE

## 2013-04-12 MED ORDER — AZITHROMYCIN 250 MG PO TABS: ORAL_TABLET | ORAL | Status: DC

## 2013-04-12 NOTE — Progress Notes (Signed)
Subjective:    Patient ID: Amy Black, female    DOB: April 27, 1940, 73 y.o.   MRN: [id number]  HPI Patient presents with four day history of sore throat, nasal congestion, and dry cough. She has taken Mucinex. She has been feeling bad for 3-4 days. She has also been running fever. The cough is not productive.   Review of Systems  Constitutional: Positive for fever and fatigue.  HENT: Positive for congestion, ear pain (right ear pain), sinus pressure, sore throat and voice change. Negative for rhinorrhea.   Eyes: Negative.   Respiratory: Positive for cough, chest tightness and shortness of breath. Negative for wheezing.   Cardiovascular: Negative.   Gastrointestinal: Negative.   Endocrine: Negative.   Genitourinary: Negative.   Musculoskeletal: Negative.   Skin: Negative.   Allergic/Immunologic: Negative.   Neurological: Positive for headaches. Negative for dizziness.  Hematological: Negative.   Psychiatric/Behavioral: Negative.        Objective:   Physical Exam  Nursing note and vitals reviewed. Constitutional: She is oriented to person, place, and time. She appears well-developed and well-nourished. No distress.  HENT:  Head: Normocephalic and atraumatic.  Right Ear: External ear normal.  Left Ear: External ear normal.  Mouth/Throat: Oropharynx is clear and moist.  Turbinate swelling and nasal congestion on the left  Eyes: Conjunctivae and EOM are normal. Pupils are equal, round, and reactive to light. Right eye exhibits no discharge. Left eye exhibits no discharge. No scleral icterus.  Neck: Normal range of motion. Neck supple. No thyromegaly present.  Cardiovascular: Normal rate, regular rhythm and normal heart sounds.   No murmur heard. At 72 per minute  Pulmonary/Chest: Effort normal and breath sounds normal. She has no wheezes. She has no rales.  Dry cough  Abdominal: Soft. Bowel sounds are normal. She exhibits no mass. There is tenderness (slight suprapubic  tenderness). There is no rebound and no guarding.  Musculoskeletal: Normal range of motion.  Patient uses a cane  Lymphadenopathy:    She has cervical adenopathy (small anterior cervical adenopathy and tenderness on the left).  Neurological: She is alert and oriented to person, place, and time.  Skin: Skin is warm and dry. No rash noted.  Psychiatric: She has a normal mood and affect. Her behavior is normal. Judgment and thought content normal.   BP 108/67  Pulse 102  Temp(Src) 99.3 F (37.4 C) (Oral)  Ht 5' 3.5" (1.613 m)  Wt 156 lb (70.761 kg)  BMI 27.20 kg/m2  Results for orders placed in visit on 04/12/13  POCT RAPID STREP A (OFFICE)      Result Value Ref Range   Rapid Strep A Screen Negative  Negative  POCT INFLUENZA A/B      Result Value Ref Range   Influenza A, POC Negative     Influenza B, POC Negative           Assessment & Plan:   1. CAD - POCT rapid strep A - Strep A culture, throat  2. Cough - azithromycin (ZITHROMAX) 250 MG tablet; 2 the first day then one daily  Dispense: 6 tablet; Refill: 0 - POCT Influenza A/B  3. Sore throat - azithromycin (ZITHROMAX) 250 MG tablet; 2 the first day then one daily  Dispense: 6 tablet; Refill: 0  4. Head congestion - azithromycin (ZITHROMAX) 250 MG tablet; 2 the first day then one daily  Dispense: 6 tablet; Refill: 0  5. Bronchitis   Patient Instructions  Continue to drink plenty of fluids Take  Tylenol for aches pains and fever Continue Mucinex, taking it twice daily with a large glass of water Use saline nose spray Use a cool mist humidifier in her bedroom at night Take prescribed medication as directed   Arrie Senate MD

## 2013-04-12 NOTE — Patient Instructions (Signed)
Continue to drink plenty of fluids Take Tylenol for aches pains and fever Continue Mucinex, taking it twice daily with a large glass of water Use saline nose spray Use a cool mist humidifier in her bedroom at night Take prescribed medication as directed

## 2013-04-14 ENCOUNTER — Telehealth: Payer: Self-pay | Admitting: Family Medicine

## 2013-04-14 MED ORDER — BENZONATATE 100 MG PO CAPS: 100.0000 mg | ORAL_CAPSULE | Freq: Two times a day (BID) | ORAL | Status: DC | PRN

## 2013-04-14 NOTE — Telephone Encounter (Signed)
Pt aware.

## 2013-04-14 NOTE — Telephone Encounter (Signed)
Spoke with pt and she is aware that Dr Jacelyn Grip out of town and he will address this message week. Pt verbalized understanding and aware to return call in around 4 days if not heard back from [place].

## 2013-04-14 NOTE — Telephone Encounter (Signed)
Pt has a lot of chest congestion. Has chills, cough. Wants rx for chest congestion to [facility]. Thanks.

## 2013-04-16 LAB — STREP A CULTURE, THROAT: Strep A Culture: NEGATIVE

## 2013-04-17 NOTE — Telephone Encounter (Signed)
We had referred her to DR Lewitt. What happened to that referral. She has requesting referral to every neurologist in the region.

## 2013-04-24 NOTE — Telephone Encounter (Signed)
Referral placed to [facility] per patient's request.

## 2013-04-25 ENCOUNTER — Other Ambulatory Visit (HOSPITAL_BASED_OUTPATIENT_CLINIC_OR_DEPARTMENT_OTHER): Payer: Medicare Other | Admitting: Lab

## 2013-04-25 ENCOUNTER — Ambulatory Visit (HOSPITAL_BASED_OUTPATIENT_CLINIC_OR_DEPARTMENT_OTHER): Payer: Medicare Other | Admitting: Hematology & Oncology

## 2013-04-25 VITALS — BP 126/64 | HR 64 | Temp 97.2°F | Resp 16 | Ht 63.0 in | Wt 154.0 lb

## 2013-04-25 DIAGNOSIS — D473 Essential (hemorrhagic) thrombocythemia: Secondary | ICD-10-CM

## 2013-04-25 DIAGNOSIS — C801 Malignant (primary) neoplasm, unspecified: Secondary | ICD-10-CM

## 2013-04-25 DIAGNOSIS — D75839 Thrombocytosis, unspecified: Secondary | ICD-10-CM

## 2013-04-25 LAB — CBC WITH DIFFERENTIAL (CANCER CENTER ONLY)
BASO#: 0.1 10*3/uL (ref 0.0–0.2)
BASO%: 0.8 % (ref 0.0–2.0)
EOS%: 5 % (ref 0.0–7.0)
Eosinophils Absolute: 0.3 10*3/uL (ref 0.0–0.5)
HCT: 37.5 % (ref 34.8–46.6)
HGB: 12.1 g/dL (ref 11.6–15.9)
LYMPH#: 2.4 10*3/uL (ref 0.9–3.3)
LYMPH%: 37 % (ref 14.0–48.0)
MCH: 30.2 pg (ref 26.0–34.0)
MCHC: 32.3 g/dL (ref 32.0–36.0)
MCV: 94 fL (ref 81–101)
MONO#: 0.6 10*3/uL (ref 0.1–0.9)
MONO%: 8.8 % (ref 0.0–13.0)
NEUT#: 3.1 10*3/uL (ref 1.5–6.5)
NEUT%: 48.4 % (ref 39.6–80.0)
Platelets: 538 10*3/uL — ABNORMAL HIGH (ref 145–400)
RBC: 4.01 10*6/uL (ref 3.70–5.32)
RDW: 13.7 % (ref 11.1–15.7)
WBC: 6.4 10*3/uL (ref 3.9–10.0)

## 2013-04-25 LAB — CHCC SATELLITE - SMEAR

## 2013-04-25 LAB — LACTATE DEHYDROGENASE: LDH: 144 U/L (ref 94–250)

## 2013-04-28 LAB — IRON AND TIBC CHCC
%SAT: 30 % (ref 21–57)
Iron: 102 ug/dL (ref 41–142)
TIBC: 344 ug/dL (ref 236–444)
UIBC: 242 ug/dL (ref 120–384)

## 2013-04-28 LAB — FERRITIN CHCC: Ferritin: 17 ng/ml (ref 9–269)

## 2013-04-28 NOTE — Progress Notes (Signed)
  DIAGNOSIS:  Progressive thrombocytosis  History of adenosquamous carcinoma of unknown primary-remission x11 years  Transverse myelitis   CURRENT THERAPY:  Observation   INTERIM HISTORY:  Ms. Kock comes in for followup. We saw her back in December. At that point I noticed that her platelet count was going up. It was 456. I do not think that there is any obvious iron deficiency.    She feels okay. She does have some fatigue. Her appetite is okay. She's had no nausea or vomiting. There's been no change in medications.    She had an MRI of the thoracic spine at the end of December. This showed normalization of the area of this myelitis. No obvious bony abnormalities were noted.    She's had no pain in her hands or feet.     PHYSICAL EXAMINATION:  Well-developed well-nourished African American female. Vital signs show a temperature of 98. Pulse 84. Blood pressure is 126/64. Weight 154 pounds. Head exam shows no ocular or oral lesions. There is no palpable cervical or supraclavicular lymph nodes. Lungs are clear. Cardiac exam regular rate rhythm with a normal S1-S2. There are no murmurs rubs or bruits. Abdomen is soft. Has good bowel sounds. There is no palpable abdominal mass There is a probable hepato- splenomegaly.  back exam no tenderness over the spine ribs or hips. She has good strength. She good range of motion of her joints. Skin exam no rashes ecchymosis or petechia. Neurological exam no focal deficits.   LABORATORY STUDIES:  Labs show white cell count of 6.4. Hemoglobin 12.1. Hematocrit 37.5. Platelet count 538. MCV 94. LDH 144. Ferritin 17 iron saturation 30%.   Her blood smear shows some increase in platelets. She has large platelets. There is a well granulated. I see no immature myeloid or lymphoid forms. Red cells show no nucleated red cells. There are no target cells. There are no rouleau formation.  IMPRESSION:  Ms. Kerrick is a very nice 73 year old  African American female. She has a past history of a localized adenosquamous carcinoma of unknown primary. This was treated with resection and radiation.   I have noted that her platelet count is going up. This could be an indicator of an underlying myeloproliferative process. It is possible that it may be low iron. Her iron studies are borderline. Her last colonoscopy was about 3 and half years ago. This all looked okay with some diverticulosis.  Is possible we may have to consider a bone marrow biopsy with her. I did send off a JAK2 assay. If this is positive, then we clearly will do a bone marrow test on her.  I will plan to have her come back to see me in about 6 weeks. I spent a good 40 minutes more with her today.  Volanda Napoleon, MD 04/28/2013

## 2013-04-29 ENCOUNTER — Telehealth: Payer: Self-pay | Admitting: Hematology & Oncology

## 2013-04-29 NOTE — Telephone Encounter (Signed)
Left pt message with 5-1 appointment

## 2013-04-30 ENCOUNTER — Telehealth: Payer: Self-pay | Admitting: *Deleted

## 2013-04-30 ENCOUNTER — Other Ambulatory Visit: Payer: Self-pay | Admitting: *Deleted

## 2013-04-30 ENCOUNTER — Telehealth: Payer: Self-pay | Admitting: Hematology & Oncology

## 2013-04-30 DIAGNOSIS — D649 Anemia, unspecified: Secondary | ICD-10-CM

## 2013-04-30 NOTE — Telephone Encounter (Signed)
Pt made 3-31 iron infusion

## 2013-04-30 NOTE — Telephone Encounter (Signed)
Message copied by Rico Ala on Wed Apr 30, 2013 11:46 AM ------      Message from: Volanda Napoleon      Created: Tue Apr 29, 2013  6:53 AM       Call - iron is low.  Need Fereheme 1020mg  x 1 dose.  Please set up for this or next week. Pete ------

## 2013-04-30 NOTE — Telephone Encounter (Signed)
Spoke with pt re: need for IV iron. Verbalizes understanding. Transferred to [employer], Marketing executive. dph

## 2013-05-01 ENCOUNTER — Encounter: Payer: Self-pay | Admitting: Hematology & Oncology

## 2013-05-02 ENCOUNTER — Encounter: Payer: Self-pay | Admitting: Neurology

## 2013-05-02 ENCOUNTER — Other Ambulatory Visit: Payer: Self-pay | Admitting: Neurology

## 2013-05-02 ENCOUNTER — Ambulatory Visit (INDEPENDENT_AMBULATORY_CARE_PROVIDER_SITE_OTHER): Payer: Medicare Other | Admitting: Neurology

## 2013-05-02 ENCOUNTER — Ambulatory Visit (HOSPITAL_COMMUNITY)
Admission: RE | Admit: 2013-05-02 | Discharge: 2013-05-02 | Disposition: A | Discharge status: Home / Self Care | Payer: Medicare Other | Source: Ambulatory Visit | Attending: Hematology & Oncology | Admitting: Hematology & Oncology

## 2013-05-02 VITALS — BP 110/78 | HR 81 | Ht 63.78 in | Wt 158.6 lb

## 2013-05-02 DIAGNOSIS — N289 Disorder of kidney and ureter, unspecified: Secondary | ICD-10-CM | POA: Insufficient documentation

## 2013-05-02 DIAGNOSIS — Z90710 Acquired absence of both cervix and uterus: Secondary | ICD-10-CM | POA: Insufficient documentation

## 2013-05-02 DIAGNOSIS — Z8589 Personal history of malignant neoplasm of other organs and systems: Secondary | ICD-10-CM | POA: Insufficient documentation

## 2013-05-02 DIAGNOSIS — G0489 Other myelitis: Secondary | ICD-10-CM

## 2013-05-02 DIAGNOSIS — D696 Thrombocytopenia, unspecified: Secondary | ICD-10-CM | POA: Insufficient documentation

## 2013-05-02 DIAGNOSIS — K5730 Diverticulosis of large intestine without perforation or abscess without bleeding: Secondary | ICD-10-CM | POA: Insufficient documentation

## 2013-05-02 DIAGNOSIS — G35 Multiple sclerosis: Secondary | ICD-10-CM

## 2013-05-02 DIAGNOSIS — I2510 Atherosclerotic heart disease of native coronary artery without angina pectoris: Secondary | ICD-10-CM | POA: Insufficient documentation

## 2013-05-02 DIAGNOSIS — C801 Malignant (primary) neoplasm, unspecified: Secondary | ICD-10-CM

## 2013-05-02 DIAGNOSIS — G373 Acute transverse myelitis in demyelinating disease of central nervous system: Secondary | ICD-10-CM

## 2013-05-02 DIAGNOSIS — N200 Calculus of kidney: Secondary | ICD-10-CM | POA: Insufficient documentation

## 2013-05-02 DIAGNOSIS — R634 Abnormal weight loss: Secondary | ICD-10-CM | POA: Insufficient documentation

## 2013-05-02 DIAGNOSIS — R911 Solitary pulmonary nodule: Secondary | ICD-10-CM | POA: Insufficient documentation

## 2013-05-02 MED ORDER — TIZANIDINE HCL 2 MG PO TABS: 2.0000 mg | ORAL_TABLET | Freq: Three times a day (TID) | ORAL | Status: DC | PRN

## 2013-05-02 NOTE — Progress Notes (Signed)
NEUROLOGY CONSULTATION NOTE  Amy Black MRN: [id number] DOB: [birth year]  Referring provider: Dr. Jacelyn Grip Primary care provider: Dr. Jacelyn Grip  Reason for consult:  Transverse myelitis.  HISTORY OF PRESENT ILLNESS: Amy Black is a 73 year old right-handed woman with history of progressive thrombocytosis, adenosquamous carcinoma of unknown primary (remission x11 years), CAD, coronary spasm, hypertension, IBS and GERD who presents for transverse myelitis.  On 02/05/12, she developed strange sensation in her right leg.  Over the next few days, she developed a progressive flaccid paralysis of the right leg and urinary retention.  She presented to [facility] on 02/10/12.  MRI of the thoracic spine with and without contrast (02/10/12) revealed non-expansile signal abnormality in the right hemicord at T11-T12 with associated patchy enhancement and sparing of the conus medullaris.  MRI of the brain without contrast (02/12/12) revealed chronic small vessel ischemic changes involving the pons and cerebral white matter.  MRI of the lumbar spine with contrast (02/10/12) revealed no acute abnormality.  MRI of the cervical spine without contrast (02/12/12) was unremarkable except for minor non-compressive disc bulges.  MRI of the pelvis with and without contrast (02/11/12) was unremarkable.  CBC and CMP were unremarkable.  ESR was 23.  She had refused LP.  She was discharged to inpatient rehab.  She followed up as an outpatient with Dr. Krista Blue at [facility].  She underwent visual evoked potentials, which demonstrated prolonged latency bilaterally, left worse than right.  She underwent further laboratory testing, which revealed positive ANA and positive RNP.  Lupus anticoagulant, TSH, B12, B6, protein electrophoresis, Lyme and ACE were unremarkable.  Vitamin D was low at 18.6.    She was referred to [facility], where she was evaluated by Dr. Diamantina Monks.  She underwent repeat MR imaging.  The thoracic and  lumbar MRIs were read as unremarkable.  MRA was performed, which revealed no evidence of AV dural fistulas.  She underwent a lumbar puncture, which revealed normal CSF.  It was believed by Dr. Moshe Cipro that she had an isolated transverse myelitis.  She experienced ongoing right leg pain at night, for which she was prescribed oxcarbazepine 300mg .  This was subsequently changed to gabapentin 100mg  at bedtime, which makes her groggy.  She tried tizanidine, which helped but she ran out.  She denies prior episodes of weakness.  She denies prior episodes of visual disturbance.  She underwent repeat MRI of the thoracic spine with and without contrast on 02/05/13, which revealed that the previous patchy hyperintensity right hemicord had normalized.  She continues to have weakness in the right foot and wonders if anything can be done for it.  PAST MEDICAL HISTORY: Past Medical History  Diagnosis Date  . CAD (coronary artery disease)   . Irritable bowel syndrome   . Diverticulosis of colon (without mention of hemorrhage)   . hepatic cyst   . Hyperlipidemia   . Adenosquamous carcinoma     left leg 2004 - radiation & resection  . HTN (hypertension)     PT. DENIES  . History of cardiac catheterization   . Unstable angina   . Adenocarcinoma     LEFT LEG  . Insomnia   . Vitamin D deficiency   . Adenocarcinoma of unknown primary 02/06/2011  . History of nuclear stress test 04/2011    lexiscan; normal study, no significant ischemia, low risk   . Coronary artery spasm     PAST SURGICAL HISTORY: Past Surgical History  Procedure Laterality Date  . Resection  soft tissue tumor leg / ankle radical  2004    leg lesion resection & radiation  . Diagnostic laparoscopy    . Lysis of adhesion    . Salpingoophorectomy Left   . Abdominal hysterectomy    . Pelvic laparoscopy  1992    RSO, AND LSO ON 2007  . Cardiac catheterization      coronary spasm  . Transthoracic echocardiogram  07/2012    LV cavity  size mildly reduced, normal wall motion; MV with calcified annulus and mild MR; LA mildly dilated; atrial septum with increased thickness - lipomatous hypertrophy; RV systolic pressure increased (borderline pulm HTN)    MEDICATIONS: Current Outpatient Prescriptions on File Prior to Visit  Medication Sig Dispense Refill  . ALPRAZolam (XANAX) 0.5 MG tablet TAKE ONE TABLET BY MOUTH THREE TIMES DAILY  90 tablet  1  . aspirin 81 MG tablet Take 1 tablet (81 mg total) by mouth daily.  30 tablet    . azithromycin (ZITHROMAX) 250 MG tablet 2 the first day then one daily  6 tablet  0  . benzonatate (TESSALON) 100 MG capsule Take 1 capsule (100 mg total) by mouth 2 (two) times daily as needed for cough.  20 capsule  0  . diphenoxylate-atropine (LOMOTIL) 2.5-0.025 MG per tablet Take 1 tablet by mouth 4 (four) times daily.  120 tablet  5  . meloxicam (MOBIC) 15 MG tablet Take 15 mg by mouth daily as needed.      . nitroGLYCERIN (NITROSTAT) 0.4 MG SL tablet Place 1 tablet (0.4 mg total) under the tongue every 5 (five) minutes as needed. For chest pain  25 tablet  3  . pantoprazole (PROTONIX) 40 MG tablet Take 40 mg by mouth 2 (two) times daily.       . traMADol (ULTRAM) 50 MG tablet Take 1 tablet (50 mg total) by mouth as needed.  30 tablet  0  . triamterene-hydrochlorothiazide (DYAZIDE) 37.5-25 MG per capsule Take 1 each (1 capsule total) by mouth every Monday, Wednesday, and Friday.  90 capsule  3  . verapamil (CALAN) 80 MG tablet Take 1 tablet (80 mg total) by mouth 4 (four) times daily - after meals and at bedtime.  360 tablet  3   No current facility-administered medications on file prior to visit.    ALLERGIES: Allergies  Allergen Reactions  . Iodine     Itching     FAMILY HISTORY: Family History  Problem Relation Age of Onset  . Diabetes      Multiple family members on both sides   . Coronary artery disease      Multiple family members on both sides   . Cancer Mother     Bladder  .  Heart disease Mother   . Hypertension Mother   . Colon cancer Neg Hx   . Heart disease Father   . Stroke Father     SOCIAL HISTORY: History   Social History  . Marital Status: Married    Spouse Name: N/A    Number of Children: 2  . Years of Education: N/A   Occupational History  . Disabilty   . DISABILITY    Social History Main Topics  . Smoking status: Former Smoker    Types: Cigarettes    Quit date: 11/20/1972  . Smokeless tobacco: Never Used  . Alcohol Use: No  . Drug Use: No  . Sexual Activity: No   Other Topics Concern  . Not on file   Social History  Narrative  . No narrative on file    REVIEW OF SYSTEMS: Constitutional: No fevers, chills, or sweats, no generalized fatigue, change in appetite Eyes: No visual changes, double vision, eye pain Ear, nose and throat: No hearing loss, ear pain, nasal congestion, sore throat Cardiovascular: No chest pain, palpitations Respiratory:  No shortness of breath at rest or with exertion, wheezes GastrointestinaI: No nausea, vomiting, diarrhea, abdominal pain, fecal incontinence Genitourinary:  No dysuria, urinary retention or frequency Musculoskeletal:  No neck pain, back pain Integumentary: No rash, pruritus, skin lesions Neurological: as above Psychiatric: No depression, insomnia, anxiety Endocrine: No palpitations, fatigue, diaphoresis, mood swings, change in appetite, change in weight, increased thirst Hematologic/Lymphatic:  No anemia, purpura, petechiae. Allergic/Immunologic: no itchy/runny eyes, nasal congestion, recent allergic reactions, rashes  PHYSICAL EXAM: Filed Vitals:   05/02/13 1232  BP: 110/78  Pulse: 81   General: No acute distress Head:  Normocephalic/atraumatic Neck: supple, no paraspinal tenderness, full range of motion Back: No paraspinal tenderness Heart: regular rate and rhythm Lungs: Clear to auscultation bilaterally. Vascular: No carotid bruits. Neurological Exam: Mental status: alert  and oriented to person, place, and time, recent and remote memory intact, fund of knowledge intact, attention and concentration intact, speech fluent and not dysarthric, language intact. Cranial nerves: CN I: not tested CN II: pupils equal, round and reactive to light, visual fields intact, fundi unremarkable, without vessel changes, exudates, hemorrhages or papilledema. CN III, IV, VI:  full range of motion, no nystagmus, no ptosis CN V: facial sensation intact CN VII: upper and lower face symmetric CN VIII: hearing intact CN IX, X: gag intact, uvula midline CN XI: sternocleidomastoid and trapezius muscles intact CN XII: tongue midline Bulk & Tone: normal, no fasciculations. Motor: 5/5 throughout Sensation: pinprick and vibration intact. Deep Tendon Reflexes: 3+ in right patellar, otherwise 2+, toes down Finger to nose testing: no dysmetria Heel to shin: no dysmetria Gait: Walks with right limp.  Able to turn.  Difficulty with tandem. Romberg negative.  IMPRESSION: Transverse myelitis.  Probably nothing else can be done to further improve the weakness.  The only inconsistency in this history is the abnormal visual evoked potentials.  She denies history of optic neuritis, but the results reportedly revealed demyelination.  Of course, this may be technical.  But history of bilateral optic neuropathy and transverse myelitis, the diagnosis of neuromyelitis optica must be entertained.  PLAN: 1.  Will check NMO antibodies. 2.  Refill tizanidine $RemoveBeforeDEI'2mg'LHKEmyomzanKIVKF$  TID as needed.  Further refills can be provided by her PCP. 3.  Further workup pending results of above test.  45 minutes spent with patient, over 50% spent counseling and coordinating care.  Thank you for allowing me to take part in the care of this patient.  Metta Clines, DO  CC:  Yaakov Guthrie, MD

## 2013-05-02 NOTE — Patient Instructions (Addendum)
I think you likely just had the transverse myelitis.  I don't think there is anything else to do about the foot.  1.  We will check blood work 2.  I will give you tizanidine.  Further refills should be made by her primary doctor.  Caution as it may make you drowsy. 3.  No follow up unless the blood test is positive.

## 2013-05-06 ENCOUNTER — Telehealth: Payer: Self-pay | Admitting: *Deleted

## 2013-05-06 ENCOUNTER — Ambulatory Visit (HOSPITAL_BASED_OUTPATIENT_CLINIC_OR_DEPARTMENT_OTHER): Payer: Medicare Other

## 2013-05-06 VITALS — BP 118/70 | HR 64 | Temp 97.6°F | Resp 18

## 2013-05-06 DIAGNOSIS — D509 Iron deficiency anemia, unspecified: Secondary | ICD-10-CM

## 2013-05-06 DIAGNOSIS — D649 Anemia, unspecified: Secondary | ICD-10-CM

## 2013-05-06 MED ORDER — SODIUM CHLORIDE 0.9 % IV SOLN
Freq: Once | INTRAVENOUS | Status: AC
  Administered 2013-05-06: 10:00:00 via INTRAVENOUS

## 2013-05-06 MED ORDER — SODIUM CHLORIDE 0.9 % IV SOLN
1020.0000 mg | Freq: Once | INTRAVENOUS | Status: AC
  Administered 2013-05-06: 1020 mg via INTRAVENOUS
  Filled 2013-05-06: qty 34

## 2013-05-06 NOTE — Patient Instructions (Signed)

## 2013-05-06 NOTE — Telephone Encounter (Addendum)
Message copied by Lenn Sink on Tue May 06, 2013  2:40 PM ------      Message from: Burney Gauze R      Created: Mon May 05, 2013 10:25 PM       Call - normal ct scan.  No obvious cancer.  Pete ------Spoke with pt and informed her that ct scan is normal; no obvious signs of cancer.

## 2013-05-08 ENCOUNTER — Telehealth: Payer: Self-pay | Admitting: *Deleted

## 2013-05-08 LAB — NEUROMYELITIS OPTICA AUTOAB, IGG: NMO-IgG: NEGATIVE

## 2013-05-08 NOTE — Telephone Encounter (Signed)
Patient is aware of normal labs  

## 2013-05-20 ENCOUNTER — Ambulatory Visit: Payer: Medicare Other | Admitting: Family Medicine

## 2013-06-03 ENCOUNTER — Other Ambulatory Visit: Payer: Self-pay | Admitting: *Deleted

## 2013-06-03 DIAGNOSIS — R079 Chest pain, unspecified: Secondary | ICD-10-CM

## 2013-06-03 NOTE — Addendum Note (Signed)
Addended by: Fidel Levy on: 06/03/2013 07:49 AM   Modules accepted: Orders

## 2013-06-06 ENCOUNTER — Other Ambulatory Visit (HOSPITAL_BASED_OUTPATIENT_CLINIC_OR_DEPARTMENT_OTHER): Payer: Medicare Other | Admitting: Lab

## 2013-06-06 ENCOUNTER — Ambulatory Visit (HOSPITAL_BASED_OUTPATIENT_CLINIC_OR_DEPARTMENT_OTHER): Payer: Medicare Other | Admitting: Hematology & Oncology

## 2013-06-06 ENCOUNTER — Encounter: Payer: Self-pay | Admitting: Hematology & Oncology

## 2013-06-06 VITALS — BP 114/57 | HR 54 | Temp 97.6°F | Resp 14 | Ht 63.0 in | Wt 156.0 lb

## 2013-06-06 DIAGNOSIS — D509 Iron deficiency anemia, unspecified: Secondary | ICD-10-CM

## 2013-06-06 DIAGNOSIS — C801 Malignant (primary) neoplasm, unspecified: Secondary | ICD-10-CM

## 2013-06-06 DIAGNOSIS — D473 Essential (hemorrhagic) thrombocythemia: Secondary | ICD-10-CM

## 2013-06-06 DIAGNOSIS — M533 Sacrococcygeal disorders, not elsewhere classified: Secondary | ICD-10-CM

## 2013-06-06 DIAGNOSIS — M25551 Pain in right hip: Secondary | ICD-10-CM

## 2013-06-06 DIAGNOSIS — D75839 Thrombocytosis, unspecified: Secondary | ICD-10-CM

## 2013-06-06 LAB — CBC WITH DIFFERENTIAL (CANCER CENTER ONLY)
BASO#: 0.1 10*3/uL (ref 0.0–0.2)
BASO%: 0.8 % (ref 0.0–2.0)
EOS%: 5.8 % (ref 0.0–7.0)
Eosinophils Absolute: 0.4 10*3/uL (ref 0.0–0.5)
HCT: 36.2 % (ref 34.8–46.6)
HGB: 12 g/dL (ref 11.6–15.9)
LYMPH#: 3.2 10*3/uL (ref 0.9–3.3)
LYMPH%: 52.6 % — ABNORMAL HIGH (ref 14.0–48.0)
MCH: 32 pg (ref 26.0–34.0)
MCHC: 33.1 g/dL (ref 32.0–36.0)
MCV: 97 fL (ref 81–101)
MONO#: 0.6 10*3/uL (ref 0.1–0.9)
MONO%: 9.2 % (ref 0.0–13.0)
NEUT#: 1.9 10*3/uL (ref 1.5–6.5)
NEUT%: 31.6 % — ABNORMAL LOW (ref 39.6–80.0)
Platelets: 382 10*3/uL (ref 145–400)
RBC: 3.75 10*6/uL (ref 3.70–5.32)
RDW: 14.5 % (ref 11.1–15.7)
WBC: 6.1 10*3/uL (ref 3.9–10.0)

## 2013-06-06 LAB — IRON AND TIBC CHCC
%SAT: 43 % (ref 21–57)
Iron: 91 ug/dL (ref 41–142)
TIBC: 214 ug/dL — ABNORMAL LOW (ref 236–444)
UIBC: 123 ug/dL (ref 120–384)

## 2013-06-06 LAB — FERRITIN CHCC: Ferritin: 285 ng/ml — ABNORMAL HIGH (ref 9–269)

## 2013-06-06 LAB — CHCC SATELLITE - SMEAR

## 2013-06-06 NOTE — Progress Notes (Signed)
Hematology and Oncology Follow Up Visit  Amy Black [id number] 03-15-1940 73 y.o. 06/06/2013   Principle Diagnosis:   Transient thrombocytosis  History transverse myelitis  History of adenosquamous carcinoma of unknown primary  Current Therapy:    Observation     Interim History:  Ms.  Amy Black is back for followup. We saw her back in March. I was worried because her platelet count is going up. I thought that maybe she was developing myelo proliferative disorder. We did do a JAK2 assay on her. This was normal.  She had a CT scan done. I was worried that the thrombocytosis bed and reactive to recurrent malignancy. Clinically, the CT scan did not show any obvious recurrent or metastatic disease.  She now is complaining of pain in the right sacroiliac region. This is being done for a couple weeks. The pain does not radiate. She's had no weakness in the right side her right leg. She's had no rashes. She's had no bleeding. There's been no fever. Had no cough.  The transverse myelitis seem to be okay from my point of view. She's been seen by neurology for this. From what [name] says it, does know what they can do. She had special blood test done looking for neuro myelitis autoantibodies. These were negative. Medications: Current outpatient prescriptions:ALPRAZolam (XANAX) 0.5 MG tablet, TAKE ONE TABLET BY MOUTH THREE TIMES DAILY, Disp: 90 tablet, Rfl: 1;  aspirin 81 MG tablet, Take 1 tablet (81 mg total) by mouth daily., Disp: 30 tablet, Rfl: ;  diphenoxylate-atropine (LOMOTIL) 2.5-0.025 MG per tablet, Take 1 tablet by mouth 4 (four) times daily., Disp: 120 tablet, Rfl: 5 nitroGLYCERIN (NITROSTAT) 0.4 MG SL tablet, Place 1 tablet (0.4 mg total) under the tongue every 5 (five) minutes as needed. For chest pain, Disp: 25 tablet, Rfl: 3;  pantoprazole (PROTONIX) 40 MG tablet, Take 40 mg by mouth 2 (two) times daily. , Disp: , Rfl: ;  tiZANidine (ZANAFLEX) 2 MG tablet, Take 1 tablet (2 mg total)  by mouth every 8 (eight) hours as needed for muscle spasms., Disp: 30 tablet, Rfl: 0 traMADol (ULTRAM) 50 MG tablet, Take 1 tablet (50 mg total) by mouth as needed., Disp: 30 tablet, Rfl: 0;  triamterene-hydrochlorothiazide (DYAZIDE) 37.5-25 MG per capsule, Take 1 each (1 capsule total) by mouth every Monday, Wednesday, and Friday., Disp: 90 capsule, Rfl: 3;  verapamil (CALAN) 80 MG tablet, Take 1 tablet (80 mg total) by mouth 4 (four) times daily - after meals and at bedtime., Disp: 360 tablet, Rfl: 3  Allergies:  Allergies  Allergen Reactions  . Iodine     Itching     Past Medical History, Surgical history, Social history, and Family History were reviewed and updated.  Review of Systems: As above  Physical Exam:  height is 5\' 3"  (1.6 m) and weight is 156 lb (70.761 kg). Her oral temperature is 97.6 F (36.4 C). Her blood pressure is 114/57 and her pulse is 54. Her respiration is 14.   [name], [name]. Her lungs are clear. Cardiac exam regular in rhythm. Abdomen soft. Has good bowel sounds. There is no fluid wave. Is no palpable liver or spleen tip. Back exam no tenderness over the spine. There's tenderness over the right sacroiliac region. No swelling is noted. Extremities shows no clubbing cyanosis or edema. Has good muscle strength bilaterally. Skin exam no rashes. Lymph node exam is negative for lymphadenopathy. Neurological exam is nonfocal.  Lab Results  Component Value Date  WBC 6.1 06/06/2013   HGB 12.0 06/06/2013   HCT 36.2 06/06/2013   MCV 97 06/06/2013   PLT 382 06/06/2013     Chemistry      Component Value Date/Time   NA 141 02/10/2013 1321   NA 140 01/23/2013 1422   NA 139 04/14/2010 1349   K 4.8 02/10/2013 1321   K 5.0* 04/14/2010 1349   CL 100 02/10/2013 1321   CL 97* 04/14/2010 1349   CO2 27 02/10/2013 1321   CO2 32 04/14/2010 1349   BUN 8 02/10/2013 1321   BUN 13 01/23/2013 1422   BUN 10 04/14/2010 1349   CREATININE 0.85 02/10/2013 1321   CREATININE 1.25*  08/29/2012 1316      Component Value Date/Time   CALCIUM 10.2 02/10/2013 1321   CALCIUM 9.5 04/14/2010 1349   ALKPHOS 102 02/10/2013 1321   ALKPHOS 84 04/14/2010 1349   AST 13 02/10/2013 1321   AST 20 04/14/2010 1349   ALT 9 02/10/2013 1321   ALT 15 04/14/2010 1349   BILITOT 0.4 02/10/2013 1321   BILITOT 0.60 04/14/2010 1349         Impression and Plan: [name] is a 73 year old African American female. Her platelet count is normal today.  I looked at her blood smear. I did not see anything that looked suspicious to me. Her platelets appeared well granulated. She had no immature myeloid cells. There were no nucleated red cells.  I don't see any evidence of her recurrent malignancy. I'm not sure how to explain this pain that she is having in the right sacroiliac region. I will get a bone scan on her.  I will plan to see her back in another month or so for followup.   Volanda Napoleon, MD 5/1/20152:18 PM

## 2013-06-11 ENCOUNTER — Other Ambulatory Visit: Payer: Self-pay | Admitting: Family Medicine

## 2013-06-12 ENCOUNTER — Ambulatory Visit (HOSPITAL_COMMUNITY)
Admission: RE | Admit: 2013-06-12 | Discharge: 2013-06-12 | Disposition: A | Discharge status: Home / Self Care | Payer: Medicare Other | Source: Ambulatory Visit | Attending: Hematology & Oncology | Admitting: Hematology & Oncology

## 2013-06-12 ENCOUNTER — Emergency Department (HOSPITAL_COMMUNITY)
Admission: EM | Admit: 2013-06-12 | Discharge: 2013-06-12 | Disposition: A | Discharge status: Home / Self Care | Payer: Medicare Other | Attending: Emergency Medicine | Admitting: Emergency Medicine

## 2013-06-12 ENCOUNTER — Encounter (HOSPITAL_COMMUNITY): Payer: Self-pay | Admitting: Emergency Medicine

## 2013-06-12 ENCOUNTER — Encounter (HOSPITAL_COMMUNITY)
Admission: RE | Admit: 2013-06-12 | Discharge: 2013-06-12 | Disposition: A | Discharge status: Home / Self Care | Payer: Medicare Other | Source: Ambulatory Visit | Attending: Hematology & Oncology | Admitting: Hematology & Oncology

## 2013-06-12 DIAGNOSIS — Z7982 Long term (current) use of aspirin: Secondary | ICD-10-CM | POA: Insufficient documentation

## 2013-06-12 DIAGNOSIS — M25559 Pain in unspecified hip: Secondary | ICD-10-CM | POA: Insufficient documentation

## 2013-06-12 DIAGNOSIS — Z859 Personal history of malignant neoplasm, unspecified: Secondary | ICD-10-CM | POA: Insufficient documentation

## 2013-06-12 DIAGNOSIS — G8389 Other specified paralytic syndromes: Secondary | ICD-10-CM | POA: Insufficient documentation

## 2013-06-12 DIAGNOSIS — M25551 Pain in right hip: Secondary | ICD-10-CM

## 2013-06-12 DIAGNOSIS — Z9889 Other specified postprocedural states: Secondary | ICD-10-CM | POA: Insufficient documentation

## 2013-06-12 DIAGNOSIS — Z8639 Personal history of other endocrine, nutritional and metabolic disease: Secondary | ICD-10-CM | POA: Insufficient documentation

## 2013-06-12 DIAGNOSIS — R339 Retention of urine, unspecified: Secondary | ICD-10-CM | POA: Insufficient documentation

## 2013-06-12 DIAGNOSIS — Z862 Personal history of diseases of the blood and blood-forming organs and certain disorders involving the immune mechanism: Secondary | ICD-10-CM | POA: Insufficient documentation

## 2013-06-12 DIAGNOSIS — Z8719 Personal history of other diseases of the digestive system: Secondary | ICD-10-CM | POA: Insufficient documentation

## 2013-06-12 DIAGNOSIS — Z87891 Personal history of nicotine dependence: Secondary | ICD-10-CM | POA: Insufficient documentation

## 2013-06-12 DIAGNOSIS — C801 Malignant (primary) neoplasm, unspecified: Secondary | ICD-10-CM | POA: Insufficient documentation

## 2013-06-12 DIAGNOSIS — I209 Angina pectoris, unspecified: Secondary | ICD-10-CM | POA: Insufficient documentation

## 2013-06-12 DIAGNOSIS — I10 Essential (primary) hypertension: Secondary | ICD-10-CM | POA: Insufficient documentation

## 2013-06-12 DIAGNOSIS — M6281 Muscle weakness (generalized): Secondary | ICD-10-CM | POA: Insufficient documentation

## 2013-06-12 DIAGNOSIS — I2510 Atherosclerotic heart disease of native coronary artery without angina pectoris: Secondary | ICD-10-CM | POA: Insufficient documentation

## 2013-06-12 DIAGNOSIS — D509 Iron deficiency anemia, unspecified: Secondary | ICD-10-CM

## 2013-06-12 DIAGNOSIS — Z79899 Other long term (current) drug therapy: Secondary | ICD-10-CM | POA: Insufficient documentation

## 2013-06-12 LAB — URINALYSIS, ROUTINE W REFLEX MICROSCOPIC
Bilirubin Urine: NEGATIVE
Glucose, UA: NEGATIVE mg/dL
Hgb urine dipstick: NEGATIVE
Ketones, ur: NEGATIVE mg/dL
Nitrite: NEGATIVE
Protein, ur: NEGATIVE mg/dL
Specific Gravity, Urine: 1.004 — ABNORMAL LOW (ref 1.005–1.030)
Urobilinogen, UA: 0.2 mg/dL (ref 0.0–1.0)
pH: 6 (ref 5.0–8.0)

## 2013-06-12 LAB — URINE MICROSCOPIC-ADD ON

## 2013-06-12 MED ORDER — TECHNETIUM TC 99M MEDRONATE IV KIT
25.0000 | PACK | Freq: Once | INTRAVENOUS | Status: AC | PRN
  Administered 2013-06-12: 25 via INTRAVENOUS

## 2013-06-12 NOTE — ED Notes (Signed)
Bladder scan performed=>400

## 2013-06-12 NOTE — ED Provider Notes (Signed)
CSN: [id number]     Arrival date & time 06/12/13  0225 History   First MD Initiated Contact with Patient 06/12/13 [phone]     Chief Complaint  Patient presents with  . Urinary Retention     (Consider location/radiation/quality/duration/timing/severity/associated sxs/prior Treatment) HPI  This is a 73 year old female with a history of coronary artery disease, hyperlipidemia, hypertension, and transverse myelitis who presents with concerns for urinary retention. Patient states that over the last 12 hours she has been "dribbling to urinate." She last urinated 1 hour ago and feels that her bladder still full.  She states that occasionally she has issues with this. Of note, she was diagnosed with transverse myelitis last January and had full urinary retention at that time. She also presented with flaccid paralysis. She denies any new weakness, numbness, or tingling in the lower extremities. Patient at baseline walks with a cane and states "my legs are still weak from the myelitis."  She denies any recent fevers, chest pain, shortness of breath.  Denies back pain.  Past Medical History  Diagnosis Date  . CAD (coronary artery disease)   . Irritable bowel syndrome   . Diverticulosis of colon (without mention of hemorrhage)   . hepatic cyst   . Hyperlipidemia   . Adenosquamous carcinoma     left leg 2004 - radiation & resection  . HTN (hypertension)     PT. DENIES  . History of cardiac catheterization   . Unstable angina   . Adenocarcinoma     LEFT LEG  . Insomnia   . Vitamin D deficiency   . Adenocarcinoma of unknown primary 02/06/2011  . History of nuclear stress test 04/2011    lexiscan; normal study, no significant ischemia, low risk   . Coronary artery spasm    Past Surgical History  Procedure Laterality Date  . Resection soft tissue tumor leg / ankle radical  2004    leg lesion resection & radiation  . Diagnostic laparoscopy    . Lysis of adhesion    . Salpingoophorectomy Left   .  Abdominal hysterectomy    . Pelvic laparoscopy  1992    RSO, AND LSO ON 2007  . Cardiac catheterization      coronary spasm  . Transthoracic echocardiogram  07/2012    LV cavity size mildly reduced, normal wall motion; MV with calcified annulus and mild MR; LA mildly dilated; atrial septum with increased thickness - lipomatous hypertrophy; RV systolic pressure increased (borderline pulm HTN)   Family History  Problem Relation Age of Onset  . Diabetes      Multiple family members on both sides   . Coronary artery disease      Multiple family members on both sides   . Cancer Mother     Bladder  . Heart disease Mother   . Hypertension Mother   . Colon cancer Neg Hx   . Heart disease Father   . Stroke Father    History  Substance Use Topics  . Smoking status: Former Smoker -- 0.50 packs/day for 4 years    Types: Cigarettes    Start date: 12/07/1968    Quit date: 11/20/1972  . Smokeless tobacco: Never Used     Comment: quit 40 years ago  . Alcohol Use: No   OB History   Grav Para Term Preterm Abortions TAB SAB Ect Mult Living   3 3        3      Review of Systems  Constitutional: Negative  for fever.  Respiratory: Negative for cough, chest tightness and shortness of breath.   Cardiovascular: Negative for chest pain.  Gastrointestinal: Negative for abdominal pain.  Genitourinary: Negative for dysuria.       Urinary retention  Musculoskeletal: Negative for back pain.  Skin: Negative for wound.  Neurological: Negative for dizziness, weakness, numbness and headaches.  Psychiatric/Behavioral: Negative for confusion.  All other systems reviewed and are negative.     Allergies  Iodine  Home Medications   Prior to Admission medications   Medication Sig Start Date End Date Taking? Authorizing Provider  ALPRAZolam Duanne Moron) 0.5 MG tablet Take 0.5 mg by mouth 3 (three) times daily as needed for anxiety.   Yes Historical Provider, MD  aspirin 81 MG tablet Take 1 tablet (81 mg  total) by mouth daily. 02/16/12  Yes Daniel J Angiulli, PA-C  nitroGLYCERIN (NITROSTAT) 0.4 MG SL tablet Place 1 tablet (0.4 mg total) under the tongue every 5 (five) minutes as needed. For chest pain 04/07/13  Yes Pixie Casino, MD  pantoprazole (PROTONIX) 40 MG tablet Take 40 mg by mouth 2 (two) times daily.  05/15/12  Yes Historical Provider, MD  tiZANidine (ZANAFLEX) 2 MG tablet Take 1 tablet (2 mg total) by mouth every 8 (eight) hours as needed for muscle spasms. 05/02/13  Yes Adam Melvern Sample, DO  traMADol (ULTRAM) 50 MG tablet Take 50 mg by mouth every 12 (twelve) hours as needed for moderate pain.   Yes Historical Provider, MD  triamterene-hydrochlorothiazide (DYAZIDE) 37.5-25 MG per capsule Take 1 each (1 capsule total) by mouth every Monday, Wednesday, and Friday. 04/07/13  Yes Pixie Casino, MD  verapamil (CALAN) 80 MG tablet Take 1 tablet (80 mg total) by mouth 4 (four) times daily - after meals and at bedtime. 04/07/13  Yes Pixie Casino, MD   BP 127/67  Pulse 62  Temp(Src) 97.8 F (36.6 C) (Oral)  Resp 20  Ht 5\' 3"  (1.6 m)  Wt 156 lb (70.761 kg)  BMI 27.64 kg/m2  SpO2 100% Physical Exam  Nursing note and vitals reviewed. Constitutional: She is oriented to person, place, and time. She appears well-developed and well-nourished. No distress.  Elderly  HENT:  Head: Normocephalic and atraumatic.  Cardiovascular: Normal rate, regular rhythm and normal heart sounds.   Pulmonary/Chest: Effort normal. No respiratory distress. She has no wheezes.  Abdominal: Soft. There is no tenderness.  Musculoskeletal: She exhibits no edema.  Neurological: She is alert and oriented to person, place, and time.  Cranial nerves II through XII intact, no dysmetria to finger-nose-finger, 5 over 5 strength in the bilateral lower extremities including knee extension, plantar and dorsiflexion. 2+ patellar reflexes.  Skin: Skin is warm and dry.  Psychiatric: She has a normal mood and affect.    ED  Course  Procedures (including critical care time) Labs Review Labs Reviewed  URINALYSIS, ROUTINE W REFLEX MICROSCOPIC - Abnormal; Notable for the following:    Specific Gravity, Urine 1.004 (*)    Leukocytes, UA SMALL (*)    All other components within normal limits  URINE MICROSCOPIC-ADD ON    Imaging Review No results found.   EKG Interpretation None      MDM   Final diagnoses:  Urinary retention    Patient presents with concerns for urinary retention. History of transverse myelitis. Patient does report residual leg weakness and she walks with a cane. She is walking at her baseline. Initial bladder scan with 350 cc. Patient was able to spontaneously  void  200 cc. Subsequent bladder scans showed multiple variable readings and the validity is questionable. Patient was thus in and out cathed.  Patient had approximately 200 cc residual.  Patient appears to void spontaneously but with incomplete voiding. Urinalysis without evidence of urinary tract infection. Patient's neurologic exam is otherwise reassuring.  Given this, have low suspicion for this being related to prior diagnosis of transverse myelitis.  However,  Discussed with patient the need to follow with neurology as soon as possible.  Patient was given strict return cautions including decreased urination over 6 hours, increasing weakness, numbness, or tingling of lower extremities. Patient stated understanding.  After history, exam, and medical workup I feel the patient has been appropriately medically screened and is safe for discharge home. Pertinent diagnoses were discussed with the patient. Patient was given return precautions.    Merryl Hacker, MD 06/12/13 [phone]

## 2013-06-12 NOTE — ED Notes (Addendum)
Pt noted swelling to feet onset yesterday, pt states she can feel build up of fluid to LLQ, urinated last 1 hour ago. Denies SHOB, A & O, NAD

## 2013-06-12 NOTE — Discharge Instructions (Signed)
Acute Urinary Retention, Female  You were found to have partial urinary retention. You were able to spontaneously pee but did have some urine left in her bladder. You should followup with her primary physician. If you are unable to urinate for greater than 6 hours she needs to return for further evaluation. You have been referred to urology.  Urinary retention means you are unable to pee completely or at all (empty your bladder). HOME CARE  Drink enough fluids to keep your pee (urine) clear or pale yellow.  If you are sent home with a tube that drains the bladder (catheter), there will be a drainage bag attached to it. There are two types of bags. One is big that you can wear at night without having to empty it. One is smaller and needs to be emptied more often.  Keep the drainage bag emptied.  Keep the drainage bag lower than the tube.  Only take medicine as told by your doctor. GET HELP IF:  You have a low-grade fever.  You have spasms or you are leaking pee when you have spasms. GET HELP RIGHT AWAY IF:   You have chills or a fever.  Your catheter stops draining pee.  Your catheter falls out.  You have increased bleeding that does not stop after you have rested and increased the amount of fluids you had been drinking. MAKE SURE YOU:   Understand these instructions.  Will watch your condition.  Will get help right away if you are not doing well or get worse. Document Released: 07/12/2007 Document Revised: 09/25/2012 Document Reviewed: 07/04/2012 [facility] Patient Information 2014 [place].

## 2013-06-13 ENCOUNTER — Telehealth: Payer: Self-pay | Admitting: *Deleted

## 2013-06-13 NOTE — Telephone Encounter (Signed)
Called patient to let her know that her bone scan is negative for cancer!

## 2013-06-13 NOTE — Telephone Encounter (Signed)
Message copied by Rico Ala on Fri Jun 13, 2013 11:43 AM ------      Message from: Volanda Napoleon      Created: Fri Jun 13, 2013  6:52 AM       Call - bone scan is negative for cancer!! Laurey Arrow ------

## 2013-06-13 NOTE — Telephone Encounter (Signed)
This is okay to refill 

## 2013-06-13 NOTE — Telephone Encounter (Signed)
Last seen 04/12/13  DWM If approved route to nurse to call into [facility]

## 2013-06-13 NOTE — Telephone Encounter (Signed)
rx called into pharmacy

## 2013-06-16 ENCOUNTER — Other Ambulatory Visit: Payer: Self-pay | Admitting: Neurology

## 2013-06-16 NOTE — Telephone Encounter (Signed)
Patient is seeing Dr Loretta Plume who is prescribing muscle relaxer

## 2013-06-17 ENCOUNTER — Other Ambulatory Visit: Payer: Self-pay | Admitting: Neurology

## 2013-06-19 NOTE — Telephone Encounter (Signed)
I spoke with the patient who says she is now seeing Dr Loretta Plume.

## 2013-06-24 ENCOUNTER — Telehealth: Payer: Self-pay | Admitting: Neurology

## 2013-06-24 ENCOUNTER — Other Ambulatory Visit: Payer: Self-pay | Admitting: Family Medicine

## 2013-06-24 ENCOUNTER — Telehealth: Payer: Self-pay | Admitting: *Deleted

## 2013-06-24 NOTE — Telephone Encounter (Signed)
Pt want a refill on tizanidine pt phone number is [phone]

## 2013-06-24 NOTE — Telephone Encounter (Signed)
Patient is aware that medication refill should be filled by PCP

## 2013-06-27 ENCOUNTER — Other Ambulatory Visit: Payer: Self-pay

## 2013-06-27 MED ORDER — TIZANIDINE HCL 4 MG PO TABS: ORAL_TABLET | ORAL | Status: DC

## 2013-07-14 ENCOUNTER — Encounter: Payer: Self-pay | Admitting: Family Medicine

## 2013-07-14 ENCOUNTER — Ambulatory Visit (INDEPENDENT_AMBULATORY_CARE_PROVIDER_SITE_OTHER): Payer: Medicare Other | Admitting: Family Medicine

## 2013-07-14 VITALS — BP 103/58 | HR 88 | Temp 98.6°F | Ht 63.75 in | Wt 159.0 lb

## 2013-07-14 DIAGNOSIS — F411 Generalized anxiety disorder: Secondary | ICD-10-CM

## 2013-07-14 DIAGNOSIS — R5383 Other fatigue: Secondary | ICD-10-CM

## 2013-07-14 DIAGNOSIS — G2581 Restless legs syndrome: Secondary | ICD-10-CM

## 2013-07-14 DIAGNOSIS — R5381 Other malaise: Secondary | ICD-10-CM

## 2013-07-14 LAB — POCT CBC
Granulocyte percent: 51.5 %G (ref 37–80)
HCT, POC: 38.3 % (ref 37.7–47.9)
Hemoglobin: 12.4 g/dL (ref 12.2–16.2)
Lymph, poc: 3 (ref 0.6–3.4)
MCH, POC: 30.7 pg (ref 27–31.2)
MCHC: 32.4 g/dL (ref 31.8–35.4)
MCV: 94.7 fL (ref 80–97)
MPV: 6.9 fL (ref 0–99.8)
POC Granulocyte: 3.6 (ref 2–6.9)
POC LYMPH PERCENT: 43 %L (ref 10–50)
Platelet Count, POC: 449 10*3/uL — AB (ref 142–424)
RBC: 4 M/uL — AB (ref 4.04–5.48)
RDW, POC: 15.2 %
WBC: 6.9 10*3/uL (ref 4.6–10.2)

## 2013-07-14 MED ORDER — ALPRAZOLAM 0.5 MG PO TABS: 0.5000 mg | ORAL_TABLET | Freq: Three times a day (TID) | ORAL | Status: DC | PRN

## 2013-07-14 MED ORDER — GABAPENTIN 100 MG PO CAPS: 100.0000 mg | ORAL_CAPSULE | Freq: Every day | ORAL | Status: DC

## 2013-07-14 NOTE — Addendum Note (Signed)
Addended by: Pollyann Kennedy F on: 07/14/2013 05:09 PM   Modules accepted: Orders

## 2013-07-14 NOTE — Progress Notes (Signed)
   Subjective:    Patient ID: Amy Black, female    DOB: April 01, 1940, 73 y.o.   MRN: [id number]  HPI This 73 y.o. female presents for evaluation of fatigue, RLS sx's, and needs refill on gabapentin for her RLS.   Review of Systems No chest pain, SOB, HA, dizziness, vision change, N/V, diarrhea, constipation, dysuria, urinary urgency or frequency, myalgias, arthralgias or rash.     Objective:   Physical Exam  Vital signs noted  Well developed well nourished female.  HEENT - Head atraumatic Normocephalic                Eyes - PERRLA, Conjuctiva - clear Sclera- Clear EOMI                Ears - EAC's Wnl TM's Wnl Gross Hearing WNL                Nose - Nares patent                 Throat - oropharanx wnl Respiratory - Lungs CTA bilateral Cardiac - RRR S1 and S2 without murmur GI - Abdomen soft Nontender and bowel sounds active x 4 Extremities - No edema. Neuro - Grossly intact.      Assessment & Plan:  Fatigue - Plan: Vitamin B12, POCT CBC, CMP14+EGFR, Thyroid Panel With TSH, Vit D  25 hydroxy (rtn osteoporosis monitoring)  Anxiety state, unspecified - Plan: Vitamin B12, ALPRAZolam (XANAX) 0.5 MG tablet  RLS (restless legs syndrome) - Plan: Vitamin B12, POCT CBC, CMP14+EGFR, Thyroid Panel With TSH, gabapentin (NEURONTIN) 100 MG capsule  Lysbeth Penner FNP

## 2013-07-15 LAB — CMP14+EGFR
ALT: 12 IU/L (ref 0–32)
AST: 17 IU/L (ref 0–40)
Albumin/Globulin Ratio: 1.9 (ref 1.1–2.5)
Albumin: 4.7 g/dL (ref 3.5–4.8)
Alkaline Phosphatase: 120 IU/L — ABNORMAL HIGH (ref 39–117)
BUN/Creatinine Ratio: 9 — ABNORMAL LOW (ref 11–26)
BUN: 8 mg/dL (ref 8–27)
CO2: 26 mmol/L (ref 18–29)
Calcium: 10 mg/dL (ref 8.7–10.3)
Chloride: 100 mmol/L (ref 97–108)
Creatinine, Ser: 0.89 mg/dL (ref 0.57–1.00)
GFR calc Af Amer: 76 mL/min/{1.73_m2} (ref >59)
GFR calc non Af Amer: 66 mL/min/{1.73_m2} (ref >59)
Globulin, Total: 2.5 g/dL (ref 1.5–4.5)
Glucose: 85 mg/dL (ref 65–99)
Potassium: 4.8 mmol/L (ref 3.5–5.2)
Sodium: 143 mmol/L (ref 134–144)
Total Bilirubin: 0.2 mg/dL (ref 0.0–1.2)
Total Protein: 7.2 g/dL (ref 6.0–8.5)

## 2013-07-15 LAB — THYROID PANEL WITH TSH
Free Thyroxine Index: 1.6 (ref 1.2–4.9)
T3 Uptake Ratio: 26 % (ref 24–39)
T4, Total: 6.1 ug/dL (ref 4.5–12.0)
TSH: 2.39 u[IU]/mL (ref 0.450–4.500)

## 2013-07-15 LAB — VITAMIN B12: Vitamin B-12: >1999 pg/mL — ABNORMAL HIGH (ref 211–946)

## 2013-07-15 LAB — VITAMIN D 25 HYDROXY (VIT D DEFICIENCY, FRACTURES): Vit D, 25-Hydroxy: 17.3 ng/mL — ABNORMAL LOW (ref 30.0–100.0)

## 2013-07-16 ENCOUNTER — Other Ambulatory Visit: Payer: Self-pay | Admitting: Family Medicine

## 2013-07-16 MED ORDER — VITAMIN D (ERGOCALCIFEROL) 1.25 MG (50000 UNIT) PO CAPS: 50000.0000 [IU] | ORAL_CAPSULE | ORAL | Status: DC

## 2013-07-17 ENCOUNTER — Telehealth: Payer: Self-pay | Admitting: Family Medicine

## 2013-07-17 ENCOUNTER — Ambulatory Visit: Payer: Medicare Other | Admitting: Family

## 2013-07-17 NOTE — Telephone Encounter (Signed)
Patient aware.

## 2013-07-28 ENCOUNTER — Ambulatory Visit: Payer: Medicare Other | Admitting: Family Medicine

## 2013-08-01 ENCOUNTER — Ambulatory Visit (HOSPITAL_BASED_OUTPATIENT_CLINIC_OR_DEPARTMENT_OTHER): Payer: Medicare Other | Admitting: Hematology & Oncology

## 2013-08-01 ENCOUNTER — Other Ambulatory Visit (HOSPITAL_BASED_OUTPATIENT_CLINIC_OR_DEPARTMENT_OTHER): Payer: Medicare Other | Admitting: Lab

## 2013-08-01 VITALS — BP 129/65 | HR 52 | Temp 97.3°F | Resp 14 | Wt 158.0 lb

## 2013-08-01 DIAGNOSIS — D509 Iron deficiency anemia, unspecified: Secondary | ICD-10-CM

## 2013-08-01 DIAGNOSIS — D473 Essential (hemorrhagic) thrombocythemia: Secondary | ICD-10-CM

## 2013-08-01 DIAGNOSIS — M25551 Pain in right hip: Secondary | ICD-10-CM

## 2013-08-01 DIAGNOSIS — Z859 Personal history of malignant neoplasm, unspecified: Secondary | ICD-10-CM

## 2013-08-01 DIAGNOSIS — C801 Malignant (primary) neoplasm, unspecified: Secondary | ICD-10-CM

## 2013-08-01 LAB — CBC WITH DIFFERENTIAL (CANCER CENTER ONLY)
BASO#: 0 10*3/uL (ref 0.0–0.2)
BASO%: 0.5 % (ref 0.0–2.0)
EOS%: 8.1 % — ABNORMAL HIGH (ref 0.0–7.0)
Eosinophils Absolute: 0.5 10*3/uL (ref 0.0–0.5)
HCT: 37.7 % (ref 34.8–46.6)
HGB: 12.2 g/dL (ref 11.6–15.9)
LYMPH#: 2.6 10*3/uL (ref 0.9–3.3)
LYMPH%: 43 % (ref 14.0–48.0)
MCH: 31.9 pg (ref 26.0–34.0)
MCHC: 32.4 g/dL (ref 32.0–36.0)
MCV: 98 fL (ref 81–101)
MONO#: 0.5 10*3/uL (ref 0.1–0.9)
MONO%: 8.5 % (ref 0.0–13.0)
NEUT#: 2.4 10*3/uL (ref 1.5–6.5)
NEUT%: 39.9 % (ref 39.6–80.0)
Platelets: 378 10*3/uL (ref 145–400)
RBC: 3.83 10*6/uL (ref 3.70–5.32)
RDW: 13.8 % (ref 11.1–15.7)
WBC: 6 10*3/uL (ref 3.9–10.0)

## 2013-08-01 LAB — COMPREHENSIVE METABOLIC PANEL
ALT: 9 U/L (ref 0–35)
AST: 13 U/L (ref 0–37)
Albumin: 4.1 g/dL (ref 3.5–5.2)
Alkaline Phosphatase: 100 U/L (ref 39–117)
BUN: 9 mg/dL (ref 6–23)
CO2: 31 mEq/L (ref 19–32)
Calcium: 9.8 mg/dL (ref 8.4–10.5)
Chloride: 101 mEq/L (ref 96–112)
Creatinine, Ser: 0.89 mg/dL (ref 0.50–1.10)
Glucose, Bld: 96 mg/dL (ref 70–99)
Potassium: 4.9 mEq/L (ref 3.5–5.3)
Sodium: 140 mEq/L (ref 135–145)
Total Bilirubin: 0.4 mg/dL (ref 0.2–1.2)
Total Protein: 7 g/dL (ref 6.0–8.3)

## 2013-08-01 LAB — CHCC SATELLITE - SMEAR

## 2013-08-01 LAB — LACTATE DEHYDROGENASE: LDH: 121 U/L (ref 94–250)

## 2013-08-03 NOTE — Progress Notes (Signed)
Hematology and Oncology Follow Up Visit  Amy Black [id number] Jun 21, 1940 73 y.o. 08/03/2013   Principle Diagnosis:   Transient thrombocytosis  History transverse myelitis  History of adenosquamous carcinoma of unknown primary  Current Therapy:    Observation     Interim History:  Ms.  Black is back for followup. We saw her back in May. I was worried because her platelet count is going up. I thought that maybe she was developing myelo proliferative disorder. We did do a JAK2 assay on her. This was normal.  She had a CT scan done. I was worried that the thrombocytosis may have been reactive to recurrent malignancy. Clinically, the CT scan did not show any obvious recurrent or metastatic disease.  She was complaining of pain in the right sacroiliac region. We went ahead and did a bone scan on her. This is done on May 7. The bone scan was negative for any obvious metastatic disease.  The transverse myelitis seem to be okay from my point of view. She's been seen by neurology for this. From what Amy Black says it, there is nothing that can be done She had special blood test done looking for neuro myelitis autoantibodies. These were negative. She did have a vitamin B12 oh which was about 2000. She had a low vitamin D level.   We did Iron studies on her. Her ferritin was 285 with an iron saturation of 43%. Medications: Current outpatient prescriptions:ALPRAZolam (XANAX) 0.5 MG tablet, Take 1 tablet (0.5 mg total) by mouth 3 (three) times daily as needed for anxiety., Disp: 90 tablet, Rfl: 3;  aspirin 81 MG tablet, Take 1 tablet (81 mg total) by mouth daily., Disp: 30 tablet, Rfl: ;  gabapentin (NEURONTIN) 100 MG capsule, Take 1 capsule (100 mg total) by mouth at bedtime., Disp: 30 capsule, Rfl: 5 nitroGLYCERIN (NITROSTAT) 0.4 MG SL tablet, Place 1 tablet (0.4 mg total) under the tongue every 5 (five) minutes as needed. For chest pain, Disp: 25 tablet, Rfl: 3;  pantoprazole (PROTONIX) 40  MG tablet, Take 40 mg by mouth 2 (two) times daily. , Disp: , Rfl: ;  tiZANidine (ZANAFLEX) 4 MG tablet, TAKE 1/2 TABLET BY MOUTH AT BEDTIME. MAY INCREASE BY 1/2 TABLET EVERY 4 DAYS AS NEEDED - UP TO 2 TABLETS AT BEDTIME, Disp: 60 tablet, Rfl: 1 traMADol (ULTRAM) 50 MG tablet, Take 50 mg by mouth every 12 (twelve) hours as needed for moderate pain., Disp: , Rfl: ;  triamterene-hydrochlorothiazide (DYAZIDE) 37.5-25 MG per capsule, Take 1 each (1 capsule total) by mouth every Monday, Wednesday, and Friday., Disp: 90 capsule, Rfl: 3;  verapamil (CALAN) 80 MG tablet, Take 1 tablet (80 mg total) by mouth 4 (four) times daily - after meals and at bedtime., Disp: 360 tablet, Rfl: 3 Vitamin D, Ergocalciferol, (DRISDOL) 50000 UNITS CAPS capsule, Take 1 capsule (50,000 Units total) by mouth every 7 (seven) days., Disp: 18 capsule, Rfl: 0  Allergies:  Allergies  Allergen Reactions  . Iodine Itching    Itching     Past Medical History, Surgical history, Social history, and Family History were reviewed and updated.  Review of Systems: As above  Physical Exam:  weight is 158 lb (71.668 kg). Her oral temperature is 97.3 F (36.3 C). Her blood pressure is 129/65 and her pulse is 52. Her respiration is 14.   [name], [name]. Her lungs are clear. Cardiac exam regular in rhythm. Abdomen soft. Has good bowel sounds. There is no fluid wave. Is  no palpable liver or spleen tip. Back exam no tenderness over the spine. There's tenderness over the right sacroiliac region. No swelling is noted. Extremities shows no clubbing cyanosis or edema. Has good muscle strength bilaterally. Skin exam no rashes. Lymph node exam is negative for lymphadenopathy. Neurological exam is nonfocal.  Lab Results  Component Value Date   WBC 6.0 08/01/2013   HGB 12.2 08/01/2013   HCT 37.7 08/01/2013   MCV 98 08/01/2013   PLT 378 08/01/2013     Chemistry      Component Value Date/Time   NA 140 08/01/2013 1246   NA 143  07/14/2013 1649   NA 139 04/14/2010 1349   K 4.9 08/01/2013 1246   K 5.0* 04/14/2010 1349   CL 101 08/01/2013 1246   CL 97* 04/14/2010 1349   CO2 31 08/01/2013 1246   CO2 32 04/14/2010 1349   BUN 9 08/01/2013 1246   BUN 8 07/14/2013 1649   BUN 10 04/14/2010 1349   CREATININE 0.89 08/01/2013 1246   CREATININE 1.25* 08/29/2012 1316      Component Value Date/Time   CALCIUM 9.8 08/01/2013 1246   CALCIUM 9.5 04/14/2010 1349   ALKPHOS 100 08/01/2013 1246   ALKPHOS 84 04/14/2010 1349   AST 13 08/01/2013 1246   AST 20 04/14/2010 1349   ALT 9 08/01/2013 1246   ALT 15 04/14/2010 1349   BILITOT 0.4 08/01/2013 1246   BILITOT 0.60 04/14/2010 1349         Impression and Plan: Amy Black is a 73 year old African American female. She is doing okay from my point of view. Again there is no evidence of recurrent malignancy. Her blood counts have a pretty much normalized. The thrombocytosis certainly could be reactive.  For now, we will plan to get her back in 6 months time. I do not see that any additional x-ray studies that need to be done on her.  Volanda Napoleon, MD 6/28/20152:07 PM

## 2013-08-04 ENCOUNTER — Ambulatory Visit (INDEPENDENT_AMBULATORY_CARE_PROVIDER_SITE_OTHER): Payer: Medicare Other | Admitting: Family Medicine

## 2013-08-04 ENCOUNTER — Encounter: Payer: Self-pay | Admitting: Family Medicine

## 2013-08-04 VITALS — BP 105/67 | HR 56 | Temp 97.3°F | Ht 63.75 in | Wt 156.2 lb

## 2013-08-04 DIAGNOSIS — M542 Cervicalgia: Secondary | ICD-10-CM

## 2013-08-04 LAB — FERRITIN CHCC: Ferritin: 193 ng/ml (ref 9–269)

## 2013-08-04 LAB — IRON AND TIBC CHCC
%SAT: 49 % (ref 21–57)
Iron: 104 ug/dL (ref 41–142)
TIBC: 211 ug/dL — ABNORMAL LOW (ref 236–444)
UIBC: 108 ug/dL — ABNORMAL LOW (ref 120–384)

## 2013-08-04 MED ORDER — METHOCARBAMOL 750 MG PO TABS: 750.0000 mg | ORAL_TABLET | Freq: Four times a day (QID) | ORAL | Status: DC

## 2013-08-04 NOTE — Progress Notes (Signed)
   Subjective:    Patient ID: Amy Black, female    DOB: Sep 22, 1940, 73 y.o.   MRN: [id number]  HPI This 73 y.o. female presents for evaluation of neck discomfort for over a week.   Review of Systems C/o neck pain   No chest pain, SOB, HA, dizziness, vision change, N/V, diarrhea, constipation, dysuria, urinary urgency or frequency or rash.  Objective:   Physical Exam  Vital signs noted  Well developed well nourished female.  HEENT - Head atraumatic Normocephalic                Eyes - PERRLA, Conjuctiva - clear Sclera- Clear EOMI                Ears - EAC's Wnl TM's Wnl Gross Hearing WNL                Throat - oropharanx wnl Respiratory - Lungs CTA bilateral Cardiac - RRR S1 and S2 without murmur GI - Abdomen soft Nontender and bowel sounds active x 4 MS - TTP cervical spine paraspinous muscles      Assessment & Plan:  Cervicalgia - Plan: methocarbamol (ROBAXIN-750) 750 MG tablet One po qid prn Lysbeth Penner FNP

## 2013-08-13 ENCOUNTER — Telehealth: Payer: Self-pay | Admitting: *Deleted

## 2013-08-13 ENCOUNTER — Other Ambulatory Visit: Payer: Self-pay | Admitting: Family Medicine

## 2013-08-13 MED ORDER — DICLOFENAC SODIUM 75 MG PO TBEC: 75.0000 mg | DELAYED_RELEASE_TABLET | Freq: Two times a day (BID) | ORAL | Status: DC

## 2013-08-13 NOTE — Telephone Encounter (Signed)
Bill, Ins would not pay for methocarbamol , the options they give are celecoxib, diclofenac potassium, difunisal, etodolac, fenoprofen, ketoprofen, meclofenamate, mefenamic acid, nabumetone, let me know if you wish me to file an appeal on her behalf to see if they will pay or will any of these work?

## 2013-08-13 NOTE — Telephone Encounter (Signed)
I sent diclofenac to the pharm

## 2013-08-18 ENCOUNTER — Ambulatory Visit (INDEPENDENT_AMBULATORY_CARE_PROVIDER_SITE_OTHER): Payer: Medicare Other | Admitting: Family Medicine

## 2013-08-18 ENCOUNTER — Encounter: Payer: Self-pay | Admitting: Family Medicine

## 2013-08-18 VITALS — BP 99/62 | HR 70 | Temp 98.2°F | Ht 63.75 in | Wt 158.8 lb

## 2013-08-18 DIAGNOSIS — R21 Rash and other nonspecific skin eruption: Secondary | ICD-10-CM

## 2013-08-18 MED ORDER — CETIRIZINE HCL 10 MG PO TABS: 10.0000 mg | ORAL_TABLET | Freq: Two times a day (BID) | ORAL | Status: DC

## 2013-08-18 NOTE — Progress Notes (Signed)
   Subjective:    Patient ID: Aubrei Bouchie Lomas, female    DOB: 01-28-1941, 73 y.o.   MRN: [id number]  HPI  C/o rash on ankle and feet for 2 days.  She denies pruritis or any insect bites.  Review of Systems C/o rash   No chest pain, SOB, HA, dizziness, vision change, N/V, diarrhea, constipation, dysuria, urinary urgency or frequency, myalgias, arthralgias  Objective:   Physical Exam  Vital signs noted  Well developed well nourished female.  HEENT - Head atraumatic Normocephalic Respiratory - Lungs CTA bilateral Cardiac - RRR S1 and S2 without murmur Skin - Erythematous rash anular and with raised center    Assessment & Plan:  Rash and nonspecific skin eruption - Plan: cetirizine (ZYRTEC) 10 MG tablet Po bid #30 Follow up prn Lysbeth Penner FNP

## 2013-08-26 ENCOUNTER — Telehealth: Payer: Self-pay | Admitting: Cardiovascular Disease

## 2013-08-26 NOTE — Telephone Encounter (Signed)
Closed encounter °

## 2013-08-29 ENCOUNTER — Other Ambulatory Visit: Payer: Self-pay | Admitting: Dermatology

## 2013-09-02 ENCOUNTER — Other Ambulatory Visit: Payer: Self-pay | Admitting: *Deleted

## 2013-09-02 MED ORDER — PANTOPRAZOLE SODIUM 40 MG PO TBEC: 40.0000 mg | DELAYED_RELEASE_TABLET | Freq: Two times a day (BID) | ORAL | Status: DC

## 2013-09-04 ENCOUNTER — Telehealth: Payer: Self-pay | Admitting: Neurology

## 2013-09-04 NOTE — Telephone Encounter (Signed)
Needs to talk to someone about medication change please call [phone]

## 2013-09-04 NOTE — Telephone Encounter (Signed)
Returned patient's call. She wants to come off the Gabapentin and be switched to something else. She currently takes 100 mg @ bedtime. She states about 2 months ago the started getting red spots/rash on her legs. She has had no new changes with medication, detergents, or soaps recently. She went to see a Dermatologist last wk where a biopsy was done on the leg area. She was told by the Dermatologist that the Gabapentin is what's causing these outbreaks. Please advise.

## 2013-09-05 NOTE — Telephone Encounter (Signed)
Honestly, I only saw her for the transverse myelitis.  We didn't have any discussion about the restless leg.  Before initiating a medication, I would need her to come in so I can get a better history regarding the restless leg.

## 2013-09-05 NOTE — Telephone Encounter (Signed)
Her former Neurologist prescribed this for her, which she stopped going to due to not being happy with her care. Since she sees you now she wanted to know if you had any advisement on this issue.

## 2013-09-05 NOTE — Telephone Encounter (Signed)
I wasn't prescribing her gabapentin.

## 2013-09-08 ENCOUNTER — Telehealth: Payer: Self-pay | Admitting: Family Medicine

## 2013-09-08 NOTE — Telephone Encounter (Signed)
appt scheduled for wed at 4:20 with christy

## 2013-09-10 ENCOUNTER — Ambulatory Visit (INDEPENDENT_AMBULATORY_CARE_PROVIDER_SITE_OTHER): Payer: Medicare Other | Admitting: Family

## 2013-09-10 ENCOUNTER — Encounter: Payer: Self-pay | Admitting: Family

## 2013-09-10 VITALS — BP 109/71 | HR 65 | Temp 97.2°F | Ht 63.75 in | Wt 155.2 lb

## 2013-09-10 DIAGNOSIS — R82998 Other abnormal findings in urine: Secondary | ICD-10-CM

## 2013-09-10 DIAGNOSIS — R5383 Other fatigue: Principal | ICD-10-CM

## 2013-09-10 DIAGNOSIS — Z4802 Encounter for removal of sutures: Secondary | ICD-10-CM

## 2013-09-10 DIAGNOSIS — R5381 Other malaise: Secondary | ICD-10-CM

## 2013-09-10 LAB — POCT URINALYSIS DIPSTICK
Bilirubin, UA: NEGATIVE
Blood, UA: NEGATIVE
Glucose, UA: NEGATIVE
Ketones, UA: NEGATIVE
Leukocytes, UA: NEGATIVE
Nitrite, UA: NEGATIVE
Protein, UA: NEGATIVE
Spec Grav, UA: 1.01
Urobilinogen, UA: NEGATIVE
pH, UA: 6.5

## 2013-09-10 LAB — POCT UA - MICROSCOPIC ONLY
Bacteria, U Microscopic: NEGATIVE
Casts, Ur, LPF, POC: NEGATIVE
Mucus, UA: NEGATIVE
RBC, urine, microscopic: NEGATIVE
WBC, Ur, HPF, POC: NEGATIVE

## 2013-09-10 NOTE — Patient Instructions (Signed)

## 2013-09-10 NOTE — Progress Notes (Signed)
   Subjective:    Patient ID: Amy Black, female    DOB: 02-16-1940, 73 y.o.   MRN: [id number]  HPI Pt presents to office to to removes stiches from left inner thigh. Pt states she had a biopsy 10 days ago. Pt denies any s/s of infection, drainage, or redness.   Pt states she does not feel well today. Pt states she feels tired and weak today. States her urine is a darker color than usual also. Pt denies any pain, N&V, SOB, or edema at this time. Only the fatigue.     Review of Systems  Constitutional: Positive for fatigue.  HENT: Negative.   Eyes: Negative.   Respiratory: Negative.  Negative for shortness of breath.   Cardiovascular: Negative.  Negative for palpitations.  Gastrointestinal: Negative.   Endocrine: Negative.   Genitourinary: Negative.   Musculoskeletal: Negative.   Neurological: Negative.  Negative for headaches.  Hematological: Negative.   Psychiatric/Behavioral: Negative.   All other systems reviewed and are negative.      Objective:   Physical Exam  Vitals reviewed. Constitutional: She is oriented to person, place, and time. She appears well-developed and well-nourished. No distress.  HENT:  Head: Normocephalic and atraumatic.  Right Ear: External ear normal.  Left Ear: External ear normal.  Nose: Nose normal.  Mouth/Throat: Oropharynx is clear and moist.  Eyes: Pupils are equal, round, and reactive to light.  Neck: Normal range of motion. Neck supple. No thyromegaly present.  Cardiovascular: Normal rate, regular rhythm, normal heart sounds and intact distal pulses.   No murmur heard. Pulmonary/Chest: Effort normal and breath sounds normal. No respiratory distress. She has no wheezes.  Abdominal: Soft. Bowel sounds are normal. She exhibits no distension. There is no tenderness.  Musculoskeletal: Normal range of motion. She exhibits no edema and no tenderness.  Neurological: She is alert and oriented to person, place, and time. She has normal reflexes.  No cranial nerve deficit.  Skin: Skin is warm and dry.  Removed 1 stitch from inner left thigh. No s/s of infection   Psychiatric: She has a normal mood and affect. Her behavior is normal. Judgment and thought content normal.    BP 109/71  Pulse 65  Temp(Src) 97.2 F (36.2 C) (Oral)  Ht 5' 3.75" (1.619 m)  Wt 155 lb 3.2 oz (70.398 kg)  BMI 26.86 kg/m2       Assessment & Plan:  1. Other malaise and fatigue - POCT UA - Microscopic Only - POCT urinalysis dipstick - Anemia Profile B - CMP14+EGFR - Thyroid Panel With TSH - Vit D  25 hydroxy (rtn osteoporosis monitoring)  2. Dark urine - POCT UA - Microscopic Only - POCT urinalysis dipstick  3. Visit for suture removal -Wound care information given -Report any s/s of infection  Evelina Dun, FNP

## 2013-09-11 ENCOUNTER — Other Ambulatory Visit: Payer: Self-pay | Admitting: Family

## 2013-09-11 LAB — ANEMIA PROFILE B
Basophils Absolute: 0 10*3/uL (ref 0.0–0.2)
Basos: 1 %
Eos: 6 %
Eosinophils Absolute: 0.4 10*3/uL (ref 0.0–0.4)
Ferritin: 246 ng/mL — ABNORMAL HIGH (ref 15–150)
Folate: 10.4 ng/mL (ref >3.0)
HCT: 37.2 % (ref 34.0–46.6)
Hemoglobin: 12.6 g/dL (ref 11.1–15.9)
Immature Grans (Abs): 0 10*3/uL (ref 0.0–0.1)
Immature Granulocytes: 0 %
Iron Saturation: 33 % (ref 15–55)
Iron: 79 ug/dL (ref 35–155)
Lymphocytes Absolute: 2.3 10*3/uL (ref 0.7–3.1)
Lymphs: 36 %
MCH: 31.6 pg (ref 26.6–33.0)
MCHC: 33.9 g/dL (ref 31.5–35.7)
MCV: 93 fL (ref 79–97)
Monocytes Absolute: 0.4 10*3/uL (ref 0.1–0.9)
Monocytes: 6 %
Neutrophils Absolute: 3.3 10*3/uL (ref 1.4–7.0)
Neutrophils Relative %: 51 %
Platelets: 445 10*3/uL — ABNORMAL HIGH (ref 150–379)
RBC: 3.99 x10E6/uL (ref 3.77–5.28)
RDW: 13.7 % (ref 12.3–15.4)
Retic Ct Pct: 1.6 % (ref 0.6–2.6)
TIBC: 237 ug/dL — ABNORMAL LOW (ref 250–450)
UIBC: 158 ug/dL (ref 150–375)
Vitamin B-12: 415 pg/mL (ref 211–946)
WBC: 6.4 10*3/uL (ref 3.4–10.8)

## 2013-09-11 LAB — CMP14+EGFR
ALT: 9 IU/L (ref 0–32)
AST: 13 IU/L (ref 0–40)
Albumin/Globulin Ratio: 1.7 (ref 1.1–2.5)
Albumin: 4.7 g/dL (ref 3.5–4.8)
Alkaline Phosphatase: 121 IU/L — ABNORMAL HIGH (ref 39–117)
BUN/Creatinine Ratio: 13 (ref 11–26)
BUN: 14 mg/dL (ref 8–27)
CO2: 27 mmol/L (ref 18–29)
Calcium: 10.1 mg/dL (ref 8.7–10.3)
Chloride: 100 mmol/L (ref 97–108)
Creatinine, Ser: 1.04 mg/dL — ABNORMAL HIGH (ref 0.57–1.00)
GFR calc Af Amer: 63 mL/min/{1.73_m2} (ref >59)
GFR calc non Af Amer: 55 mL/min/{1.73_m2} — ABNORMAL LOW (ref >59)
Globulin, Total: 2.8 g/dL (ref 1.5–4.5)
Glucose: 85 mg/dL (ref 65–99)
Potassium: 4.9 mmol/L (ref 3.5–5.2)
Sodium: 141 mmol/L (ref 134–144)
Total Bilirubin: 0.2 mg/dL (ref 0.0–1.2)
Total Protein: 7.5 g/dL (ref 6.0–8.5)

## 2013-09-11 LAB — THYROID PANEL WITH TSH
Free Thyroxine Index: 1.5 (ref 1.2–4.9)
T3 Uptake Ratio: 27 % (ref 24–39)
T4, Total: 5.7 ug/dL (ref 4.5–12.0)
TSH: 0.911 u[IU]/mL (ref 0.450–4.500)

## 2013-09-11 LAB — VITAMIN D 25 HYDROXY (VIT D DEFICIENCY, FRACTURES): Vit D, 25-Hydroxy: 27.7 ng/mL — ABNORMAL LOW (ref 30.0–100.0)

## 2013-09-18 ENCOUNTER — Telehealth: Payer: Self-pay | Admitting: Internal Medicine

## 2013-09-18 NOTE — Telephone Encounter (Signed)
09/15/13 Submitted Attending Physician Statement to [place] @ [facility] 09/18/13 Received Attending Physician Statement forms back from [place] @ [facility] 09/18/13 Given to Sherwood Gambler RN for Dr Debara Pickett to complete and sign.  Requested paperwork be returned to me upon completion

## 2013-09-23 ENCOUNTER — Telehealth: Payer: Self-pay | Admitting: Internal Medicine

## 2013-09-23 NOTE — Telephone Encounter (Signed)
Received Completed and signed Attending Physicians Supplementary Statement from Dr Debara Pickett . Mailed to [employer] on 09/23/13 lp

## 2013-09-23 NOTE — Telephone Encounter (Signed)
Disability form reviewed by Dr. Debara Pickett. Patient is not disabled from a cardiac standpoint. Form given to [facility], Medical Records

## 2013-09-23 NOTE — Telephone Encounter (Signed)
Amy Black,  Please close [facility]-- Release ID [phone]  All has been completed.

## 2013-09-29 ENCOUNTER — Telehealth: Payer: Self-pay | Admitting: Internal Medicine

## 2013-09-29 NOTE — Telephone Encounter (Signed)
Returned call to patient.   Patient is very upset that Dr. Debara Pickett did not assess her to be disabled from a cardiac standpoint - deferring the attending physicians supplementary statement to the provider who manages her transverse myelitis. She states that this assessment in incorrect and Dr. Rollene Fare had been filling out her paperwork for years. RN apologized for this inconvenience and reiterated the information provided in paperwork by Dr. Debara Pickett.

## 2013-09-29 NOTE — Telephone Encounter (Signed)
Pt called in stating that Dr. Debara Pickett contacted her insurance company and told them that she was no longer disabled and she would like to speak to him about that. She also stated that she is a former [place] pt. Please call  Thanks

## 2013-10-06 ENCOUNTER — Encounter: Payer: Self-pay | Admitting: Neurology

## 2013-10-06 ENCOUNTER — Ambulatory Visit (INDEPENDENT_AMBULATORY_CARE_PROVIDER_SITE_OTHER): Payer: Medicare Other | Admitting: Neurology

## 2013-10-06 VITALS — BP 140/72 | HR 74 | Temp 97.7°F | Resp 18 | Wt 160.7 lb

## 2013-10-06 DIAGNOSIS — R200 Anesthesia of skin: Secondary | ICD-10-CM

## 2013-10-06 DIAGNOSIS — R29898 Other symptoms and signs involving the musculoskeletal system: Secondary | ICD-10-CM

## 2013-10-06 DIAGNOSIS — M79609 Pain in unspecified limb: Secondary | ICD-10-CM

## 2013-10-06 DIAGNOSIS — R209 Unspecified disturbances of skin sensation: Secondary | ICD-10-CM

## 2013-10-06 DIAGNOSIS — M79601 Pain in right arm: Secondary | ICD-10-CM

## 2013-10-06 DIAGNOSIS — G373 Acute transverse myelitis in demyelinating disease of central nervous system: Secondary | ICD-10-CM

## 2013-10-06 DIAGNOSIS — G0489 Other myelitis: Secondary | ICD-10-CM

## 2013-10-06 MED ORDER — DULOXETINE HCL 30 MG PO CPEP: 30.0000 mg | ORAL_CAPSULE | Freq: Every day | ORAL | Status: DC

## 2013-10-06 NOTE — Progress Notes (Signed)
NEUROLOGY FOLLOW UP OFFICE NOTE  Amy Black [id number]  HISTORY OF PRESENT ILLNESS: Amy Black is a 73 year old right-handed woman with history of progressive thrombocytosis, adenosquamous carcinoma of unknown primary (remission x11 years), CAD, coronary spasm, hypertension, IBS and GERD who follows up for transverse myelitis and restless leg syndrome.  UPDATE: Given history of transverse myelitis and bilateral optic neuropathy, NMO antibodies were checked, which were negative.  About 2 months ago, she began noticing more weakness in the right leg.  However, she developed completely new symptoms as well.  She reports brief shooting pain, like a "lighthening bolt" starting in the right scapula and traveling down the right arm and right side of her torso.  It seems to be triggered when she laughs or occurs spontaneously.  She also reports aching pain in the right leg, noticeable in bed and causes occasional brief jerks.  She was previously taking gabapentin 100mg  at bedtime, but this had to be discontinued due to development of a rash.  Tizanidine does not help.  More recently, she notes new numbness in the left leg with mild weakness in the left knee.  HISTORY: On 02/05/12, she developed strange sensation in her right leg.  Over the next few days, she developed a progressive flaccid paralysis of the right leg and urinary retention.  She presented to [facility] on 02/10/12.  MRI of the thoracic spine with and without contrast (02/10/12) revealed non-expansile signal abnormality in the right hemicord at T11-T12 with associated patchy enhancement and sparing of the conus medullaris.  MRI of the brain without contrast (02/12/12) revealed chronic small vessel ischemic changes involving the pons and cerebral white matter.  MRI of the lumbar spine with contrast (02/10/12) revealed no acute abnormality.  MRI of the cervical spine without contrast (02/12/12) was unremarkable except for minor non-compressive  disc bulges.  MRI of the pelvis with and without contrast (02/11/12) was unremarkable.  CBC and CMP were unremarkable.  ESR was 23.  She had refused LP.  She was discharged to inpatient rehab.  She followed up as an outpatient with Dr. Krista Blue at [facility].  She underwent visual evoked potentials, which demonstrated prolonged latency bilaterally, left worse than right.  She underwent further laboratory testing, which revealed positive ANA and positive RNP.  Lupus anticoagulant, TSH, B12, B6, protein electrophoresis, Lyme and ACE were unremarkable.  Vitamin D was low at 18.6.    She was referred to [facility], where she was evaluated by Dr. Diamantina Monks.  She underwent repeat MR imaging.  The thoracic and lumbar MRIs were read as unremarkable.  MRA was performed, which revealed no evidence of AV dural fistulas.  She underwent a lumbar puncture, which revealed normal CSF.  It was believed by Dr. Moshe Cipro that she had an isolated transverse myelitis.  She experienced ongoing right leg pain at night, for which she was prescribed oxcarbazepine 300mg .  This was subsequently changed to gabapentin 100mg  at bedtime, which makes her groggy.  She tried tizanidine, which helped but she ran out.  She denies prior episodes of weakness.  She denies prior episodes of visual disturbance.  She underwent repeat MRI of the thoracic spine with and without contrast on 02/05/13, which revealed that the previous patchy hyperintensity right hemicord had normalized.  PAST MEDICAL HISTORY: Past Medical History  Diagnosis Date  . CAD (coronary artery disease)   . Irritable bowel syndrome   . Diverticulosis of colon (without mention of hemorrhage)   . hepatic  cyst   . Hyperlipidemia   . Adenosquamous carcinoma     left leg 2004 - radiation & resection  . HTN (hypertension)     PT. DENIES  . History of cardiac catheterization   . Unstable angina   . Adenocarcinoma     LEFT LEG  . Insomnia   . Vitamin D deficiency    . Adenocarcinoma of unknown primary 02/06/2011  . History of nuclear stress test 04/2011    lexiscan; normal study, no significant ischemia, low risk   . Coronary artery spasm     MEDICATIONS: Current Outpatient Prescriptions on File Prior to Visit  Medication Sig Dispense Refill  . ALPRAZolam (XANAX) 0.5 MG tablet Take 1 tablet (0.5 mg total) by mouth 3 (three) times daily as needed for anxiety.  90 tablet  3  . aspirin 81 MG tablet Take 1 tablet (81 mg total) by mouth daily.  30 tablet    . cetirizine (ZYRTEC) 10 MG tablet Take 1 tablet (10 mg total) by mouth 2 (two) times daily.  30 tablet  0  . nitroGLYCERIN (NITROSTAT) 0.4 MG SL tablet Place 1 tablet (0.4 mg total) under the tongue every 5 (five) minutes as needed. For chest pain  25 tablet  3  . pantoprazole (PROTONIX) 40 MG tablet Take 1 tablet (40 mg total) by mouth 2 (two) times daily.  180 tablet  0  . tiZANidine (ZANAFLEX) 4 MG tablet TAKE 1/2 TABLET BY MOUTH AT BEDTIME. MAY INCREASE BY 1/2 TABLET EVERY 4 DAYS AS NEEDED - UP TO 2 TABLETS AT BEDTIME  60 tablet  1  . triamterene-hydrochlorothiazide (DYAZIDE) 37.5-25 MG per capsule Take 1 each (1 capsule total) by mouth every Monday, Wednesday, and Friday.  90 capsule  3  . verapamil (CALAN) 80 MG tablet Take 1 tablet (80 mg total) by mouth 4 (four) times daily - after meals and at bedtime.  360 tablet  3  . Vitamin D, Ergocalciferol, (DRISDOL) 50000 UNITS CAPS capsule Take 1 capsule (50,000 Units total) by mouth every 7 (seven) days.  18 capsule  0  . diclofenac (VOLTAREN) 75 MG EC tablet Take 1 tablet (75 mg total) by mouth 2 (two) times daily.  30 tablet  0  . gabapentin (NEURONTIN) 100 MG capsule Take 1 capsule (100 mg total) by mouth at bedtime.  30 capsule  5   No current facility-administered medications on file prior to visit.    ALLERGIES: Allergies  Allergen Reactions  . Iodine Itching    Itching     FAMILY HISTORY: Family History  Problem Relation Age of Onset    . Diabetes      Multiple family members on both sides   . Coronary artery disease      Multiple family members on both sides   . Cancer Mother     Bladder  . Heart disease Mother   . Hypertension Mother   . Colon cancer Neg Hx   . Heart disease Father   . Stroke Father     SOCIAL HISTORY: History   Social History  . Marital Status: Married    Spouse Name: N/A    Number of Children: 2  . Years of Education: N/A   Occupational History  . Disabilty   . DISABILITY    Social History Main Topics  . Smoking status: Former Smoker -- 0.50 packs/day for 4 years    Types: Cigarettes    Start date: 12/07/1968    Quit date: 11/20/1972  .  Smokeless tobacco: Never Used     Comment: quit 40 years ago  . Alcohol Use: No  . Drug Use: No  . Sexual Activity: No   Other Topics Concern  . Not on file   Social History Narrative  . No narrative on file    REVIEW OF SYSTEMS: Constitutional: No fevers, chills, or sweats, no generalized fatigue, change in appetite Eyes: No visual changes, double vision, eye pain Ear, nose and throat: No hearing loss, ear pain, nasal congestion, sore throat Cardiovascular: No chest pain, palpitations Respiratory:  No shortness of breath at rest or with exertion, wheezes GastrointestinaI: No nausea, vomiting, diarrhea, abdominal pain, fecal incontinence Genitourinary:  No dysuria, urinary retention or frequency Musculoskeletal:  Right sided upper and lower back pain. Integumentary: No rash, pruritus, skin lesions Neurological: as above Psychiatric: No depression, insomnia, anxiety Endocrine: No palpitations, fatigue, diaphoresis, mood swings, change in appetite, change in weight, increased thirst Hematologic/Lymphatic:  No anemia, purpura, petechiae. Allergic/Immunologic: no itchy/runny eyes, nasal congestion, recent allergic reactions, rashes  PHYSICAL EXAM: Filed Vitals:   10/06/13 0903  BP: 140/72  Pulse: 74  Temp: 97.7 F (36.5 C)  Resp:  18   General: No acute distress Head:  Normocephalic/atraumatic Neck: supple, no paraspinal tenderness, full range of motion Heart:  Regular rate and rhythm Lungs:  Clear to auscultation bilaterally Back: No paraspinal tenderness Neurological Exam: alert and oriented to person, place, and time. Attention span and concentration intact, recent and remote memory intact, fund of knowledge intact.  Speech fluent and not dysarthric, language intact.  CN II-XII intact. Fundi not visualized.  Bulk and tone normal, muscle strength 4+/5 right hip flexion, 5-/5 right knee flexion, otherwise 5/5 throughout.  Sensation to pinprick reduced in the left lower extremity up to the mid-shin, but vibration intact.  Deep tendon reflexes 3+ in right upper and lower extremities, 2+ left upper and lower extremities, right toe withdrawals, left toe down.  Finger to nose and heel to shin testing intact (with mild weakness in the right leg.  Gait antalgic, Romberg negative.  IMPRESSION: History of transverse myelitis Right leg weakness, chronic but recurrent Right upper back pain, new Left leg numbness, new.  PLAN: 1.  Since the left leg numbness and right upper back and arm pain is new, will get MRI of the cervical and thoracic cord with and without contrast to look for new and active lesions.  MRI of the lumbar spine from last year revealed no structural abnormalities. 2.  For neuropathic pain, will try Cymbalta $RemoveBefo'30mg'[id number]$  daily.  Side effects discussed.  Instructed to call in 4 weeks with update.   3.  Discontinue tramadol.  Continue tizanidine as needed. 4.  Follow up in 3 months or as needed.  Metta Clines, DO  CC:  Yaakov Guthrie, MD  Redge Gainer, MD

## 2013-10-06 NOTE — Patient Instructions (Addendum)
1.  I think we should check another MRI of the cervical and thoracic spine with and without contrast to look for anything new that would be causing these new symptoms. We have scheduled you at [facility] for your MRI on 10/20/2013 at 4:00pm. Please arrive 15 minutes prior and go to 1st floor radiology. If you need to reschedule for any reason please call [phone]. 2.  For the pain, we will try Cymbalta 30mg  daily.  Call in 4 weeks with update. 3.  Stop tramadol 4.  Follow up in 3 months.

## 2013-10-06 NOTE — Progress Notes (Signed)
Note faxed.

## 2013-10-09 ENCOUNTER — Telehealth: Payer: Self-pay | Admitting: *Deleted

## 2013-10-09 NOTE — Telephone Encounter (Signed)
Patient requesting  something to take when she has her MRI on 09/14

## 2013-10-10 MED ORDER — DIAZEPAM 5 MG PO TABS: 5.0000 mg | ORAL_TABLET | Freq: Once | ORAL | Status: DC

## 2013-10-10 NOTE — Telephone Encounter (Signed)
Returning your call. °

## 2013-10-10 NOTE — Telephone Encounter (Signed)
Left message on machine for patient to call back. To make her aware we can call in a valium, but she can not take until she arrives at facility and she will need a driver. Awaiting call back to make her aware.

## 2013-10-10 NOTE — Telephone Encounter (Signed)
Spoke with patient - she was made aware and RX was called to the [facility].

## 2013-10-15 ENCOUNTER — Telehealth: Payer: Self-pay | Admitting: Neurology

## 2013-10-15 NOTE — Telephone Encounter (Signed)
Please call pt about medication please [phone]

## 2013-10-15 NOTE — Telephone Encounter (Signed)
Patient states Cymbalta 30  Mg is not doing well with her makes her feel bad .Please advise

## 2013-10-15 NOTE — Telephone Encounter (Signed)
[phone] please call

## 2013-10-16 NOTE — Telephone Encounter (Signed)
We can try Lyrica 75mg  twice daily.

## 2013-10-20 ENCOUNTER — Ambulatory Visit (HOSPITAL_COMMUNITY)
Admission: RE | Admit: 2013-10-20 | Discharge: 2013-10-20 | Disposition: A | Discharge status: Home / Self Care | Payer: Medicare Other | Source: Ambulatory Visit | Attending: Neurology | Admitting: Neurology

## 2013-10-20 ENCOUNTER — Ambulatory Visit: Payer: Medicare Other | Admitting: Cardiovascular Disease

## 2013-10-20 ENCOUNTER — Ambulatory Visit (HOSPITAL_COMMUNITY): Payer: Medicare Other

## 2013-10-20 DIAGNOSIS — R200 Anesthesia of skin: Secondary | ICD-10-CM

## 2013-10-20 DIAGNOSIS — M79601 Pain in right arm: Secondary | ICD-10-CM

## 2013-10-20 DIAGNOSIS — G0489 Other myelitis: Secondary | ICD-10-CM | POA: Diagnosis not present

## 2013-10-20 DIAGNOSIS — M79609 Pain in unspecified limb: Secondary | ICD-10-CM | POA: Insufficient documentation

## 2013-10-20 DIAGNOSIS — R209 Unspecified disturbances of skin sensation: Secondary | ICD-10-CM | POA: Diagnosis not present

## 2013-10-20 DIAGNOSIS — R29898 Other symptoms and signs involving the musculoskeletal system: Secondary | ICD-10-CM | POA: Diagnosis present

## 2013-10-20 DIAGNOSIS — G373 Acute transverse myelitis in demyelinating disease of central nervous system: Secondary | ICD-10-CM

## 2013-10-20 MED ORDER — GADOBENATE DIMEGLUMINE 529 MG/ML IV SOLN
15.0000 mL | Freq: Once | INTRAVENOUS | Status: AC | PRN
Start: 2013-10-20 — End: 2013-10-20
  Administered 2013-10-20: 15 mL via INTRAVENOUS

## 2013-10-22 ENCOUNTER — Ambulatory Visit (INDEPENDENT_AMBULATORY_CARE_PROVIDER_SITE_OTHER): Payer: Medicare Other | Admitting: Nurse Practitioner

## 2013-10-22 ENCOUNTER — Encounter: Payer: Self-pay | Admitting: Nurse Practitioner

## 2013-10-22 VITALS — BP 136/75 | HR 62 | Temp 98.0°F | Ht 64.0 in | Wt 160.2 lb

## 2013-10-22 DIAGNOSIS — F411 Generalized anxiety disorder: Secondary | ICD-10-CM

## 2013-10-22 DIAGNOSIS — L500 Allergic urticaria: Secondary | ICD-10-CM

## 2013-10-22 MED ORDER — ALPRAZOLAM 0.5 MG PO TABS: 0.5000 mg | ORAL_TABLET | Freq: Three times a day (TID) | ORAL | Status: DC | PRN

## 2013-10-22 MED ORDER — METHYLPREDNISOLONE ACETATE 80 MG/ML IJ SUSP
80.0000 mg | Freq: Once | INTRAMUSCULAR | Status: AC
  Administered 2013-10-22: 80 mg via INTRAMUSCULAR

## 2013-10-22 NOTE — Patient Instructions (Signed)

## 2013-10-22 NOTE — Progress Notes (Signed)
   Subjective:    Patient ID: Amy Black, female    DOB: [birth year], 73 y.o.   MRN: [id number]  Allergic Reaction This is a new problem. The current episode started 2 days ago. The problem occurs constantly. The problem is unchanged. The problem is mild. Associated with: benadryl.  Time of exposure: monday. Associated symptoms include eye watering, itching and a rash. Pertinent negatives include no hyperventilation. There is no swelling present. Past treatments include diphenhydramine. The treatment provided mild relief. Medication allergies: idodine.       Review of Systems  Constitutional: Negative.   HENT: Negative.   Eyes: Negative.   Respiratory: Negative.   Cardiovascular: Negative.   Gastrointestinal: Negative.   Endocrine: Negative.   Genitourinary: Negative.   Musculoskeletal: Negative.   Skin: Positive for itching and rash.  Allergic/Immunologic: Negative.   Neurological: Negative.   Hematological: Negative.   Psychiatric/Behavioral: Negative.        Objective:   Physical Exam  Constitutional: She is oriented to person, place, and time. She appears well-developed and well-nourished.  HENT:  Head: Normocephalic.  Eyes: Pupils are equal, round, and reactive to light.  Cardiovascular: Normal rate and regular rhythm.   Pulmonary/Chest: Effort normal.  Abdominal: Soft. Bowel sounds are normal.  Musculoskeletal: Normal range of motion.  Neurological: She is alert and oriented to person, place, and time.  Skin: Skin is warm and dry.  Psychiatric: She has a normal mood and affect.    BP 136/75  Pulse 62  Temp(Src) 98 F (36.7 C) (Oral)  Ht 5\' 4"  (1.626 m)  Wt 160 lb 3.2 oz (72.666 kg)  BMI 27.48 kg/m2       Assessment & Plan:   1. Allergic urticaria    Meds ordered this encounter  Medications  . methylPREDNISolone acetate (DEPO-MEDROL) injection 80 mg    Sig:    Avoid itching Benadryl OTC RTO prn  Mary-Margaret Hassell Done, FNP

## 2013-10-24 ENCOUNTER — Ambulatory Visit: Payer: Medicare Other | Admitting: Family Medicine

## 2013-10-24 ENCOUNTER — Telehealth: Payer: Self-pay | Admitting: *Deleted

## 2013-10-24 NOTE — Telephone Encounter (Signed)
Message copied by Claudie Revering on Fri Oct 24, 2013  3:56 PM ------      Message from: JAFFE, ADAM R      Created: Thu Oct 23, 2013  7:42 AM       MRI is stable.  No new abnormalities      ----- Message -----         From: Rad Results In Interface         Sent: 10/21/2013   9:00 AM           To: Dudley Major, DO                   ------

## 2013-10-27 ENCOUNTER — Telehealth: Payer: Self-pay | Admitting: Neurology

## 2013-10-27 ENCOUNTER — Telehealth: Payer: Self-pay | Admitting: *Deleted

## 2013-10-27 NOTE — Telephone Encounter (Signed)
Left message MRI results normal

## 2013-10-27 NOTE — Telephone Encounter (Signed)
Pt called again. I informed her that you left a message stating her results came back normal  But she now wants to speak with a nurse regarding her meds. Please call her  [phone]

## 2013-10-27 NOTE — Telephone Encounter (Signed)
Pt called wanting to f/u on her MRI results from last Monday. Please call pt # [phone]

## 2013-10-29 ENCOUNTER — Telehealth: Payer: Self-pay | Admitting: Family

## 2013-10-29 ENCOUNTER — Ambulatory Visit (INDEPENDENT_AMBULATORY_CARE_PROVIDER_SITE_OTHER): Payer: Medicare Other | Admitting: Family

## 2013-10-29 ENCOUNTER — Encounter: Payer: Self-pay | Admitting: Family

## 2013-10-29 VITALS — BP 127/77 | HR 59 | Temp 97.4°F | Ht 64.0 in | Wt 156.6 lb

## 2013-10-29 DIAGNOSIS — Z0271 Encounter for disability determination: Secondary | ICD-10-CM

## 2013-10-29 DIAGNOSIS — I2510 Atherosclerotic heart disease of native coronary artery without angina pectoris: Secondary | ICD-10-CM

## 2013-10-29 DIAGNOSIS — C801 Malignant (primary) neoplasm, unspecified: Secondary | ICD-10-CM

## 2013-10-29 NOTE — Progress Notes (Signed)
   Subjective:    Patient ID: Amy Black, female    DOB: 1940-10-20, 73 y.o.   MRN: [id number]  HPI Pt presents to the office for disability paper work filled. Pt states she has been on disability since Aug of 1982. Pt's Cardiologists had been filling out paperwork, but has since retired. Pt has a hx of unstable angina (1982), sarcoma of left leg (2006) transverse myelitis (2014). Pt does not have full mobility of legs and must walk with a cane.    Review of Systems  Constitutional: Negative.   HENT: Negative.   Eyes: Negative.   Respiratory: Negative.  Negative for shortness of breath.   Cardiovascular: Negative.  Negative for palpitations.  Gastrointestinal: Negative.   Endocrine: Negative.   Genitourinary: Negative.   Musculoskeletal: Positive for gait problem.  Neurological: Negative.  Negative for headaches.  Hematological: Negative.   Psychiatric/Behavioral: Negative.   All other systems reviewed and are negative.      Objective:   Physical Exam  Vitals reviewed. Constitutional: She is oriented to person, place, and time. She appears well-developed and well-nourished. No distress.  HENT:  Head: Normocephalic and atraumatic.  Right Ear: External ear normal.  Left Ear: External ear normal.  Nose: Nose normal.  Mouth/Throat: Oropharynx is clear and moist.  Eyes: Pupils are equal, round, and reactive to light.  Neck: Normal range of motion. Neck supple. No thyromegaly present.  Cardiovascular: Normal rate, regular rhythm, normal heart sounds and intact distal pulses.   No murmur heard. Pulmonary/Chest: Effort normal and breath sounds normal. No respiratory distress. She has no wheezes.  Abdominal: Soft. Bowel sounds are normal. She exhibits no distension. There is no tenderness.  Musculoskeletal: She exhibits tenderness. She exhibits no edema.  Decreased ROM of right ankle with flexion and rotation.   Neurological: She is alert and oriented to person, place, and time.  She has normal reflexes. No cranial nerve deficit.  Skin: Skin is warm and dry.  Psychiatric: She has a normal mood and affect. Her behavior is normal. Judgment and thought content normal.   BP 127/77  Pulse 59  Temp(Src) 97.4 F (36.3 C) (Oral)  Ht 5\' 4"  (1.626 m)  Wt 156 lb 9.6 oz (71.033 kg)  BMI 26.87 kg/m2        Assessment & Plan:  1. Disability examination -Falls precaution discussed -disability paper work filled out -Greater than 20 mins spent with pt discussing past medical history and filling out paperwork  Evelina Dun, FNP

## 2013-10-29 NOTE — Patient Instructions (Signed)

## 2013-10-30 NOTE — Telephone Encounter (Signed)
Papers faxed.

## 2013-11-12 ENCOUNTER — Telehealth: Payer: Self-pay | Admitting: Family

## 2013-11-13 NOTE — Telephone Encounter (Signed)
We will need to fill in new form. There was an error on a question.

## 2013-11-24 ENCOUNTER — Other Ambulatory Visit: Payer: Self-pay | Admitting: Internal Medicine

## 2013-11-24 NOTE — Telephone Encounter (Signed)
Rx was sent to pharmacy electronically. OV with Dr. Gwenlyn Found on 10/23

## 2013-11-28 ENCOUNTER — Ambulatory Visit (INDEPENDENT_AMBULATORY_CARE_PROVIDER_SITE_OTHER): Payer: Medicare Other | Admitting: Cardiovascular Disease

## 2013-11-28 ENCOUNTER — Encounter: Payer: Self-pay | Admitting: Cardiovascular Disease

## 2013-11-28 VITALS — BP 106/68 | HR 59 | Ht 63.0 in | Wt 154.7 lb

## 2013-11-28 DIAGNOSIS — R0602 Shortness of breath: Secondary | ICD-10-CM

## 2013-11-28 DIAGNOSIS — R079 Chest pain, unspecified: Secondary | ICD-10-CM

## 2013-11-28 NOTE — Patient Instructions (Addendum)
  We will see you back in follow up in 1 year with Dr Berry.   Dr Berry has ordered: 1. Lexiscan Myoview- this is a test that looks at the blood flow to your heart muscle.  It takes approximately 2 1/2 hours. Please follow instruction sheet, as given.   2.  Echocardiogram. Echocardiography is a painless test that uses sound waves to create images of your heart. It provides your doctor with information about the size and shape of your heart and how well your heart's chambers and valves are working. This procedure takes approximately one hour. There are no restrictions for this procedure.      

## 2013-11-28 NOTE — Progress Notes (Signed)
11/28/2013 Amy Black   10-22-1940  [id number]  Primary Physician Redge Gainer, MD Primary Cardiologist: Lorretta Harp MD Renae Gloss   HPI:  Amy Black is a very pleasant 73 year old female followed by Dr. Rollene Fare since the 1980s. She saw Dr. Mali Hilty 6 months ago in the office. She has a history of chest pain in the past and a remote cardiac catheterization in 1970s with ergotamine provocation which was positive, suggesting coronary spasm. Since then she has done well with chronic calcium channel blockers, especially verapamil 4 times daily. Unfortunately, she had a recent acute episode of transverse myelitis which she noted after a car ride and getting out she had some right foot drag. She had an MRI which showed a non-expansile signal in the right hemicord at T11-T12 with patchy enhancement and sparing of the conus. She was then referred to [facility] neurology to see Dr. Diamantina Monks who confirmed the diagnosis of transverse myelitis, without a clear etiology. Since then she's undergone rehabilitation and is being fitted for a brace. Her insurance no longer covers out of network care at [facility] therefore she is awaiting an appointment with a local neurologist. She has been having pain in her right leg mostly at night when she lays flat which is improved somewhat with tramadol. Apparently she is failed gabapentin in the past and occasionally takes muscle relaxants. She also has dyslipidemia but is well-controlled on low-dose Lipitor. She had a echocardiogram which was essentially normal except for mild mitral regurgitation and April of 2014. She had a stress test in 2013 which was negative as well.  Recently she's noticed increasing substernal chest pain which is nitroglycerin responsive and shortness of breath.    Current Outpatient Prescriptions  Medication Sig Dispense Refill  . ALPRAZolam (XANAX) 0.5 MG tablet Take 1 tablet (0.5 mg total) by mouth 3 (three) times  daily as needed for anxiety.  90 tablet  1  . aspirin 81 MG tablet Take 1 tablet (81 mg total) by mouth daily.  30 tablet    . cetirizine (ZYRTEC) 10 MG tablet Take 1 tablet (10 mg total) by mouth 2 (two) times daily.  30 tablet  0  . Cholecalciferol (VITAMIN D PO) Take by mouth. OTC      . gatifloxacin (ZYMAXID) 0.5 % SOLN       . nitroGLYCERIN (NITROSTAT) 0.4 MG SL tablet Place 1 tablet (0.4 mg total) under the tongue every 5 (five) minutes as needed. For chest pain  25 tablet  3  . pantoprazole (PROTONIX) 40 MG tablet TAKE ONE TABLET BY MOUTH TWICE DAILY.  180 tablet  0  . triamterene-hydrochlorothiazide (MAXZIDE-25) 37.5-25 MG per tablet       . verapamil (CALAN) 80 MG tablet Take 1 tablet (80 mg total) by mouth 4 (four) times daily - after meals and at bedtime.  360 tablet  3   No current facility-administered medications for this visit.    Allergies  Allergen Reactions  . Cymbalta [Duloxetine Hcl]     sleepiness  . Gabapentin     sleepiness  . Iodine Itching    Itching   . Ivp Dye [Iodinated Diagnostic Agents] Itching  . Zanaflex [Tizanidine Hcl]     Very drunk feeling next day    History   Social History  . Marital Status: Married    Spouse Name: N/A    Number of Children: 2  . Years of Education: N/A   Occupational History  .  Disabilty   . DISABILITY    Social History Main Topics  . Smoking status: Former Smoker -- 0.50 packs/day for 4 years    Types: Cigarettes    Start date: 12/07/1968    Quit date: 11/20/1972  . Smokeless tobacco: Never Used     Comment: quit 40 years ago  . Alcohol Use: No  . Drug Use: No  . Sexual Activity: No   Other Topics Concern  . Not on file   Social History Narrative  . No narrative on file     Review of Systems: General: negative for chills, fever, night sweats or weight changes.  Cardiovascular: negative for chest pain, dyspnea on exertion, edema, orthopnea, palpitations, paroxysmal nocturnal dyspnea or shortness of  breath Dermatological: negative for rash Respiratory: negative for cough or wheezing Urologic: negative for hematuria Abdominal: negative for nausea, vomiting, diarrhea, bright red blood per rectum, melena, or hematemesis Neurologic: negative for visual changes, syncope, or dizziness All other systems reviewed and are otherwise negative except as noted above.    Blood pressure 106/68, pulse 59, height 5\' 3"  (1.6 m), weight 154 lb 11.2 oz (70.171 kg).  General appearance: alert and no distress Neck: no adenopathy, no carotid bruit, no JVD, supple, symmetrical, trachea midline and thyroid not enlarged, symmetric, no tenderness/mass/nodules Lungs: clear to auscultation bilaterally Heart: regular rate and rhythm, S1, S2 normal, no murmur, click, rub or gallop Extremities: extremities normal, atraumatic, no cyanosis or edema  EKG sinus bradycardia at 59 without ST or T wave changes  ASSESSMENT AND PLAN:   Chest pain Patient has a history of normal coronary arteries by cath in 1974 but positive ergonovine stimulation suggesting coronary vasospasm. She has been on a calcium channel blocker since. She saw Dr. Debara Pickett in the office 6 months ago she now complains of worsening chest pain and shortness of breath. Her last stress test perform another one drop in 2013 was normal and her last 2-D echo performed a year ago was normal as well. I'm going to repeat a pharmacologic Myoview stress test and 2-D echocardiogram..      Lorretta Harp MD [facility], [facility] 11/28/2013 11:57 AM

## 2013-11-28 NOTE — Assessment & Plan Note (Signed)
Patient has a history of normal coronary arteries by cath in 1974 but positive ergonovine stimulation suggesting coronary vasospasm. She has been on a calcium channel blocker since. She saw Dr. Debara Pickett in the office 6 months ago she now complains of worsening chest pain and shortness of breath. Her last stress test perform another one drop in 2013 was normal and her last 2-D echo performed a year ago was normal as well. I'm going to repeat a pharmacologic Myoview stress test and 2-D echocardiogram..

## 2013-12-01 ENCOUNTER — Other Ambulatory Visit: Payer: Self-pay | Admitting: Family Medicine

## 2013-12-02 NOTE — Telephone Encounter (Signed)
This prescription is okay 1. The patient needs to make an appointment to be seen for further prescriptions with a provider.

## 2013-12-02 NOTE — Telephone Encounter (Signed)
Last seen 10/29/13 [name]  If approved route to nurse to call into [place]

## 2013-12-08 ENCOUNTER — Encounter: Payer: Self-pay | Admitting: Cardiovascular Disease

## 2013-12-11 ENCOUNTER — Telehealth: Payer: Self-pay | Admitting: Neurology

## 2013-12-11 ENCOUNTER — Telehealth: Payer: Self-pay | Admitting: *Deleted

## 2013-12-11 NOTE — Telephone Encounter (Signed)
Pt called requesting to speak to a nurse regarding her right foot. She has a couple of questions she would like to address. C/B [phone]

## 2013-12-12 ENCOUNTER — Telehealth: Payer: Self-pay | Admitting: Family Medicine

## 2013-12-12 NOTE — Telephone Encounter (Signed)
Pt given appt for Monday with bill oxford @10 :45

## 2013-12-15 ENCOUNTER — Encounter: Payer: Self-pay | Admitting: Family Medicine

## 2013-12-15 ENCOUNTER — Ambulatory Visit (INDEPENDENT_AMBULATORY_CARE_PROVIDER_SITE_OTHER): Payer: Medicare Other | Admitting: Family Medicine

## 2013-12-15 VITALS — BP 123/59 | HR 64 | Temp 97.0°F | Ht 64.0 in | Wt 155.2 lb

## 2013-12-15 DIAGNOSIS — G44021 Chronic cluster headache, intractable: Secondary | ICD-10-CM

## 2013-12-15 DIAGNOSIS — M79605 Pain in left leg: Secondary | ICD-10-CM

## 2013-12-15 DIAGNOSIS — D508 Other iron deficiency anemias: Secondary | ICD-10-CM

## 2013-12-15 LAB — POCT CBC
Granulocyte percent: 58.8 %G (ref 37–80)
HCT, POC: 36.5 % — AB (ref 37.7–47.9)
Hemoglobin: 12.1 g/dL — AB (ref 12.2–16.2)
Lymph, poc: 2.8 (ref 0.6–3.4)
MCH, POC: 31.1 pg (ref 27–31.2)
MCHC: 33 g/dL (ref 31.8–35.4)
MCV: 94.1 fL (ref 80–97)
MPV: 7.3 fL (ref 0–99.8)
POC Granulocyte: 4.2 (ref 2–6.9)
POC LYMPH PERCENT: 39.4 %L (ref 10–50)
Platelet Count, POC: 492 10*3/uL — AB (ref 142–424)
RBC: 3.9 M/uL — AB (ref 4.04–5.48)
RDW, POC: 14 %
WBC: 7.2 10*3/uL (ref 4.6–10.2)

## 2013-12-15 MED ORDER — CYCLOBENZAPRINE HCL 5 MG PO TABS: 5.0000 mg | ORAL_TABLET | Freq: Three times a day (TID) | ORAL | Status: DC | PRN

## 2013-12-15 NOTE — Progress Notes (Signed)
   Subjective:    Patient ID: Amy Black, female    DOB: [birth year], 73 y.o.   MRN: [id number]  HPI C/o left foot dicomfort.  She states her whole left foot is feeling like a weight.  Her neurologist gave her a muscle relaxer that helped.  She has transversed meyolitis and sees neurology. She is c/o headache and this has happened before she states when her blood was low.  Review of Systems  Constitutional: Negative for fever.  HENT: Negative for ear pain.   Eyes: Negative for discharge.  Respiratory: Negative for cough.   Cardiovascular: Negative for chest pain.  Gastrointestinal: Negative for abdominal distention.  Endocrine: Negative for polyuria.  Genitourinary: Negative for difficulty urinating.  Musculoskeletal: Negative for gait problem and neck pain.  Skin: Negative for color change and rash.  Neurological: Negative for speech difficulty and headaches.  Psychiatric/Behavioral: Negative for agitation.       Objective:    BP 123/59 mmHg  Pulse 64  Temp(Src) 97 F (36.1 C) (Oral)  Ht 5\' 4"  (1.626 m)  Wt 155 lb 3.2 oz (70.398 kg)  BMI 26.63 kg/m2 Physical Exam  Constitutional: She is oriented to person, place, and time. She appears well-developed and well-nourished.  HENT:  Head: Normocephalic and atraumatic.  Mouth/Throat: Oropharynx is clear and moist.  Eyes: Pupils are equal, round, and reactive to light.  Neck: Normal range of motion. Neck supple.  Cardiovascular: Normal rate and regular rhythm.   No murmur heard. Pulmonary/Chest: Effort normal and breath sounds normal.  Abdominal: Soft. Bowel sounds are normal. There is no tenderness.  Neurological: She is alert and oriented to person, place, and time.  Skin: Skin is warm and dry.  Psychiatric: She has a normal mood and affect.          Assessment & Plan:     ICD-9-CM ICD-10-CM   1. Other iron deficiency anemias 280.8 D50.8 POCT CBC  2. Intractable chronic cluster headache 339.02 G44.021  cyclobenzaprine (FLEXERIL) 5 MG tablet  3. Pain of left lower extremity 729.5 M79.605 cyclobenzaprine (FLEXERIL) 5 MG tablet     No Follow-up on file.  Lysbeth Penner FNP

## 2013-12-16 NOTE — Telephone Encounter (Signed)
Patient called asking for a foot and ankle Dr name she is have a foot issue she says she has a orthopedic and I advised her to call him she thought this was a good idea   By Jodell Cipro Der Ruthell Rummage, LPN

## 2013-12-17 NOTE — Telephone Encounter (Signed)
Encounter complete. 

## 2013-12-19 ENCOUNTER — Ambulatory Visit (HOSPITAL_BASED_OUTPATIENT_CLINIC_OR_DEPARTMENT_OTHER)
Admission: RE | Admit: 2013-12-19 | Discharge: 2013-12-19 | Disposition: A | Discharge status: Home / Self Care | Payer: Medicare Other | Source: Ambulatory Visit | Attending: Internal Medicine | Admitting: Internal Medicine

## 2013-12-19 ENCOUNTER — Telehealth: Payer: Self-pay | Admitting: Neurology

## 2013-12-19 ENCOUNTER — Ambulatory Visit (HOSPITAL_COMMUNITY)
Admission: RE | Admit: 2013-12-19 | Discharge: 2013-12-19 | Disposition: A | Discharge status: Home / Self Care | Payer: Medicare Other | Source: Ambulatory Visit | Attending: Internal Medicine | Admitting: Internal Medicine

## 2013-12-19 DIAGNOSIS — Z812 Family history of tobacco abuse and dependence: Secondary | ICD-10-CM | POA: Diagnosis not present

## 2013-12-19 DIAGNOSIS — I998 Other disorder of circulatory system: Secondary | ICD-10-CM | POA: Insufficient documentation

## 2013-12-19 DIAGNOSIS — R0602 Shortness of breath: Secondary | ICD-10-CM | POA: Insufficient documentation

## 2013-12-19 DIAGNOSIS — R42 Dizziness and giddiness: Secondary | ICD-10-CM | POA: Insufficient documentation

## 2013-12-19 DIAGNOSIS — R0600 Dyspnea, unspecified: Secondary | ICD-10-CM | POA: Insufficient documentation

## 2013-12-19 DIAGNOSIS — R079 Chest pain, unspecified: Secondary | ICD-10-CM

## 2013-12-19 DIAGNOSIS — I2510 Atherosclerotic heart disease of native coronary artery without angina pectoris: Secondary | ICD-10-CM | POA: Insufficient documentation

## 2013-12-19 DIAGNOSIS — I10 Essential (primary) hypertension: Secondary | ICD-10-CM | POA: Diagnosis not present

## 2013-12-19 MED ORDER — REGADENOSON 0.4 MG/5ML IV SOLN
0.4000 mg | Freq: Once | INTRAVENOUS | Status: AC
  Administered 2013-12-19: 0.4 mg via INTRAVENOUS

## 2013-12-19 MED ORDER — TECHNETIUM TC 99M SESTAMIBI GENERIC - CARDIOLITE
30.9000 | Freq: Once | INTRAVENOUS | Status: AC | PRN
  Administered 2013-12-19: 30.9 via INTRAVENOUS

## 2013-12-19 MED ORDER — TECHNETIUM TC 99M SESTAMIBI GENERIC - CARDIOLITE
10.5000 | Freq: Once | INTRAVENOUS | Status: AC | PRN
  Administered 2013-12-19: 11 via INTRAVENOUS

## 2013-12-19 NOTE — Progress Notes (Signed)
2D Echocardiogram Complete.  12/19/2013   Neddie Steedman [place], [place]

## 2013-12-19 NOTE — Telephone Encounter (Signed)
Pt is returning someone call please call her back on [phone]

## 2013-12-19 NOTE — Procedures (Addendum)
[facility] [address] [place] [address] [phone]  Cardiology Nuclear Med Jakiah Bienaime Creed is a 73 y.o. female     MRN : [id number]     DOB: 05/02/1940  Procedure Date: 12/19/2013  Nuclear Med Background Indication for Stress Test:  Evaluation for Ischemia History:  CAD;unstable angina;coronary artery spasm;adenocarcinoma;DVT of colon;hepatic cyst;Last NUC MPI on 04/14/2011-nonischemic;EF=62%. Cardiac Risk Factors: Family History - CAD, History of Smoking, Hypertension and Lipids  Symptoms:  Chest Pain, Light-Headedness and SOB   Nuclear Pre-Procedure Caffeine/Decaff Intake:  9:00pm NPO After: 7:00am   IV Site: R Forearm  IV 0.9% NS with Angio Cath:  22g  Chest Size (in):  n/a IV Started by: Rolene Course, RN  Height: 5\' 3"  (1.6 m)  Cup Size: B  BMI:  Body mass index is 27.29 kg/(m^2). Weight:  154 lb (69.854 kg)   Tech Comments:  n/a    Nuclear Med Study 1 or 2 day study: 1 day  Stress Test Type:  [place] Provider:  Quay Burow, MD   Resting Radionuclide: Technetium 25m Sestamibi  Resting Radionuclide Dose: 10.5 mCi   Stress Radionuclide:  Technetium 76m Sestamibi  Stress Radionuclide Dose: 30.9 mCi           Stress Protocol Rest HR: 52 Stress HR: 78  Rest BP: 145/81 Stress BP: 153/71  Exercise Time (min): n/a METS: n/a   Predicted Max HR: 149 bpm % Max HR: 55.7 bpm Rate Pressure Product: 12699  Dose of Adenosine (mg):  n/a Dose of Lexiscan: 0.4 mg  Dose of Atropine (mg): n/a Dose of Dobutamine: n/a mcg/kg/min (at max HR)  Stress Test Technologist: Leane Para, CCT Nuclear Technologist: Imagene Riches, CNMT   Rest Procedure:  Myocardial perfusion imaging was performed at rest 45 minutes following the intravenous administration of Technetium 20m Sestamibi. Stress Procedure:  The patient received IV Lexiscan 0.4 mg over 15-seconds.  Technetium 70m Sestamibi injected at  30-seconds.  There were no significant changes with Lexiscan.  Quantitative spect images were obtained after a 45 minute delay.  Transient Ischemic Dilatation (Normal <1.22):  1.25  QGS EDV:  105 ml QGS ESV:  49 ml LV Ejection Fraction: 53%        Rest ECG: Sinus bradycardia with no ST changes and first degree AV block.  Stress ECG: No significant ST segment change suggestive of ischemia.  QPS Raw Data Images:  Acquisition technically good; normal left ventricular size. Stress Images:  Normal homogeneous uptake in all areas of the myocardium. Rest Images:  Normal homogeneous uptake in all areas of the myocardium. Subtraction (SDS):  No evidence of ischemia.  Impression Exercise Capacity:  Lexiscan with no exercise. BP Response:  Normal blood pressure response. Clinical Symptoms:  There is dyspnea. ECG Impression:  No significant ST segment change suggestive of ischemia. Comparison with Prior Nuclear Study: Compared to 04/14/11, no significant change.   Overall Impression:  Normal stress nuclear study.  LV Wall Motion:  NL LV Function; NL Wall Motion   Kirk Ruths, MD  12/19/2013 12:18 PM

## 2013-12-22 ENCOUNTER — Telehealth: Payer: Self-pay | Admitting: *Deleted

## 2013-12-22 ENCOUNTER — Encounter: Payer: Self-pay | Admitting: Neurology

## 2013-12-22 ENCOUNTER — Ambulatory Visit (INDEPENDENT_AMBULATORY_CARE_PROVIDER_SITE_OTHER): Payer: Medicare Other | Admitting: Neurology

## 2013-12-22 VITALS — BP 116/70 | HR 74 | Temp 97.6°F | Resp 18 | Wt 158.1 lb

## 2013-12-22 DIAGNOSIS — R202 Paresthesia of skin: Secondary | ICD-10-CM

## 2013-12-22 DIAGNOSIS — G0489 Other myelitis: Secondary | ICD-10-CM

## 2013-12-22 DIAGNOSIS — G373 Acute transverse myelitis in demyelinating disease of central nervous system: Secondary | ICD-10-CM

## 2013-12-22 DIAGNOSIS — R29898 Other symptoms and signs involving the musculoskeletal system: Secondary | ICD-10-CM

## 2013-12-22 DIAGNOSIS — R200 Anesthesia of skin: Secondary | ICD-10-CM

## 2013-12-22 DIAGNOSIS — M549 Dorsalgia, unspecified: Secondary | ICD-10-CM

## 2013-12-22 NOTE — Telephone Encounter (Signed)
-----   Message from Lorretta Harp, MD sent at 12/20/2013 11:16 AM EST ----- Call and tell normal

## 2013-12-22 NOTE — Progress Notes (Signed)
NEUROLOGY FOLLOW UP OFFICE NOTE  Amy Black [id number]  HISTORY OF PRESENT ILLNESS: Amy Black is a 73 year old right-handed woman with progressive thrombocytosis, adenosquamous carcinoma of unknown primary (remission x11 years), CAD, coronary spasm, hypertension, IBS and GERD who follows up for transverse myelitis and restless leg syndrome.  Records and MRIs from 10/21/13 were personally reviewed.  UPDATE: She reported new right leg heaviness, bilateral leg numbness and right upper back pain.  She reports that she is starting to feel new heaviness in the left leg as well.  Repeat MRI of the cervical and thoracic spines with and without contrast were performed on 10/21/13 and were unremarkable.    HISTORY: On 02/05/12, she developed strange sensation in her right leg.  Over the next few days, she developed a progressive flaccid paralysis of the right leg and urinary retention.  She presented to [facility] on 02/10/12.  MRI of the thoracic spine with and without contrast (02/10/12) revealed non-expansile signal abnormality in the right hemicord at T11-T12 with associated patchy enhancement and sparing of the conus medullaris.  MRI of the brain without contrast (02/12/12) revealed chronic small vessel ischemic changes involving the pons and cerebral white matter.  MRI of the lumbar spine with contrast (02/10/12) revealed no acute abnormality.  MRI of the cervical spine without contrast (02/12/12) was unremarkable except for minor non-compressive disc bulges.  MRI of the pelvis with and without contrast (02/11/12) was unremarkable.  CBC and CMP were unremarkable.  ESR was 23.  She had refused LP.  She was discharged to inpatient rehab.  She was evaluated by Dr. Krista Black at [facility].  She underwent visual evoked potentials, which demonstrated prolonged latency bilaterally, left worse than right.  She underwent further laboratory testing, which revealed positive ANA and positive RNP.  Lupus  anticoagulant, TSH, B12, B6, protein electrophoresis, Lyme and ACE were unremarkable.  Vitamin D was low at 18.6.    She was referred to [facility], where she was evaluated by Dr. Diamantina Black.  She underwent repeat MR imaging.  The thoracic and lumbar MRIs were read as unremarkable.  MRA was performed, which revealed no evidence of AV dural fistulas.  She underwent a lumbar puncture, which revealed normal CSF.  She underwent repeat MRI of the thoracic spine with and without contrast on 02/05/13, which revealed that the previous patchy hyperintensity right hemicord had normalized.  NMO antibodies were negative.  She experienced ongoing right leg pain at night, for which she was prescribed oxcarbazepine 331m.  This was subsequently changed to gabapentin 1052mat bedtime, which makes her groggy.  Cymbalta made her not feel well.  She was then started on Lyrica 7577mwice daily.  She also take tizanidine.  She denies prior episodes of weakness.  She denies prior episodes of visual disturbance.  PAST MEDICAL HISTORY: Past Medical History  Diagnosis Date  . CAD (coronary artery disease)   . Irritable bowel syndrome   . Diverticulosis of colon (without mention of hemorrhage)   . hepatic cyst   . Hyperlipidemia   . Adenosquamous carcinoma     left leg 2004 - radiation & resection  . HTN (hypertension)     PT. DENIES  . History of cardiac catheterization   . Unstable angina   . Adenocarcinoma     LEFT LEG  . Insomnia   . Vitamin D deficiency   . Adenocarcinoma of unknown primary 02/06/2011  . History of nuclear stress test 04/2011  lexiscan; normal study, no significant ischemia, low risk   . Coronary artery spasm     MEDICATIONS: Current Outpatient Prescriptions on File Prior to Visit  Medication Sig Dispense Refill  . ALPRAZolam (XANAX) 0.5 MG tablet TAKE ONE TABLET BY MOUTH THREE TIMES DAILY AS NEEDED FOR ANXIETY 90 tablet 0  . aspirin 81 MG tablet Take 1 tablet (81 mg total) by mouth  daily. 30 tablet   . cetirizine (ZYRTEC) 10 MG tablet Take 1 tablet (10 mg total) by mouth 2 (two) times daily. 30 tablet 0  . Cholecalciferol (VITAMIN D PO) Take by mouth. OTC    . cyclobenzaprine (FLEXERIL) 5 MG tablet Take 1 tablet (5 mg total) by mouth 3 (three) times daily as needed for muscle spasms. 30 tablet 1  . nitroGLYCERIN (NITROSTAT) 0.4 MG SL tablet Place 1 tablet (0.4 mg total) under the tongue every 5 (five) minutes as needed. For chest pain 25 tablet 3  . pantoprazole (PROTONIX) 40 MG tablet TAKE ONE TABLET BY MOUTH TWICE DAILY. 180 tablet 0  . traMADol (ULTRAM) 50 MG tablet   3  . triamterene-hydrochlorothiazide (MAXZIDE-25) 37.5-25 MG per tablet     . verapamil (CALAN) 80 MG tablet Take 1 tablet (80 mg total) by mouth 4 (four) times daily - after meals and at bedtime. 360 tablet 3  . gatifloxacin (ZYMAXID) 0.5 % SOLN      No current facility-administered medications on file prior to visit.    ALLERGIES: Allergies  Allergen Reactions  . Cymbalta [Duloxetine Hcl]     sleepiness  . Gabapentin     sleepiness  . Iodine Itching    Itching   . Ivp Dye [Iodinated Diagnostic Agents] Itching  . Zanaflex [Tizanidine Hcl]     Very drunk feeling next day    FAMILY HISTORY: Family History  Problem Relation Age of Onset  . Diabetes      Multiple family members on both sides   . Coronary artery disease      Multiple family members on both sides   . Cancer Mother     Bladder  . Heart disease Mother   . Hypertension Mother   . Colon cancer Neg Hx   . Heart disease Father   . Stroke Father     SOCIAL HISTORY: History   Social History  . Marital Status: Married    Spouse Name: N/A    Number of Children: 2  . Years of Education: N/A   Occupational History  . Disabilty   . DISABILITY    Social History Main Topics  . Smoking status: Former Smoker -- 0.50 packs/day for 4 years    Types: Cigarettes    Start date: 12/07/1968    Quit date: 11/20/1972  . Smokeless  tobacco: Never Used     Comment: quit 40 years ago  . Alcohol Use: No  . Drug Use: No  . Sexual Activity: No   Other Topics Concern  . Not on file   Social History Narrative    REVIEW OF SYSTEMS: Constitutional: No fevers, chills, or sweats, no generalized fatigue, change in appetite Eyes: No visual changes, double vision, eye pain Ear, nose and throat: No hearing loss, ear pain, nasal congestion, sore throat Cardiovascular: No chest pain, palpitations Respiratory:  No shortness of breath at rest or with exertion, wheezes GastrointestinaI: No nausea, vomiting, diarrhea, abdominal pain, fecal incontinence Genitourinary:  No dysuria, urinary retention or frequency Musculoskeletal:  No neck pain, back pain Integumentary: No rash,  pruritus, skin lesions Neurological: as above Psychiatric: No depression, insomnia, anxiety Endocrine: No palpitations, fatigue, diaphoresis, mood swings, change in appetite, change in weight, increased thirst Hematologic/Lymphatic:  No anemia, purpura, petechiae. Allergic/Immunologic: no itchy/runny eyes, nasal congestion, recent allergic reactions, rashes  PHYSICAL EXAM: Filed Vitals:   12/22/13 0851  BP: 116/70  Pulse: 74  Temp: 97.6 F (36.4 C)  Resp: 18   General: No acute distress Head:  Normocephalic/atraumatic Eyes:  Fundi could not be visualized. Neck: supple, no paraspinal tenderness, full range of motion Heart:  Regular rate and rhythm Lungs:  Clear to auscultation bilaterally Back: No paraspinal tenderness Neurological Exam: alert and oriented to person, place, and time. Attention span and concentration intact, recent and remote memory intact, fund of knowledge intact.  Speech fluent and not dysarthric, language intact.  CN II-XII intact. Fundi not visualized.  Bulk and tone normal, muscle strength 4+/5 right hip flexion, 3+/5 right extensor hallucis longus, otherwise 5/5 throughout.  Sensation to pinprick reduced in the lower extremities  up to the mid-chest, but vibration intact.  Deep tendon reflexes 3+ in ower extremities, 2+ upper extremities, right Babinski, left toe down.  Finger to nose intact.  Gait with right leg limp.  IMPRESSION: History of transverse myelitis.  No active demyelination. However, reports new right lower leg heaviness and bilateral feet numbness. Back pain.  PLAN: Will get NCV-EMG of lower extremities to evaluate for a peripheral cause of increased right leg weakness and bilateral numbness. Follow up afterwards.  Metta Clines, DO  CC: Redge Gainer, MD

## 2013-12-22 NOTE — Patient Instructions (Signed)
I can't find any reason in the spinal cord to account for the right leg weakness and numbness in feet.  We will get a nerve conduction study of the legs (particularly the right leg) to look for something going on in the nerves.  I will see you after the test.

## 2013-12-22 NOTE — Telephone Encounter (Signed)
Discussed results with pt. Voiced understanding.

## 2013-12-24 ENCOUNTER — Ambulatory Visit (INDEPENDENT_AMBULATORY_CARE_PROVIDER_SITE_OTHER): Payer: Medicare Other | Admitting: Neurology

## 2013-12-24 DIAGNOSIS — R29898 Other symptoms and signs involving the musculoskeletal system: Secondary | ICD-10-CM

## 2013-12-24 DIAGNOSIS — R202 Paresthesia of skin: Secondary | ICD-10-CM

## 2013-12-24 DIAGNOSIS — G373 Acute transverse myelitis in demyelinating disease of central nervous system: Secondary | ICD-10-CM

## 2013-12-24 DIAGNOSIS — R200 Anesthesia of skin: Secondary | ICD-10-CM

## 2013-12-24 NOTE — Procedures (Signed)
[facility]  [place], [place]  [place], [place] [address] Tel: [phone] Fax:  [phone] Test Date:  12/24/2013  Patient: Amy Black DOB: 1940/03/23 Physician: Narda Amber  Sex: Female Height: 5\' 3"  Ref Phys: Metta Clines  ID#: [id number] Temp: 34.0C Technician: Laureen Ochs R. NCS T.   Patient Complaints: Patient is a 73 year old female presenting for evaluation of bilateral lower extremity with numbness and weakness, worse on the right.  NCV & EMG Findings: Extensive diagnostic testing of the right lower extremity and additional studies of the left reveals:  1. Bilateral sural and superficial peroneal sensory responses are within normal limits. 2. Bilateral peroneal motor responses are within normal limits. Bilateral tibial responses are absent. 3. In the right lower extremity, there is a global pattern of incomplete motor unit activation involving nearly all of the tested muscles. This may be due to poor voluntary effort, pain, or central disorder of motor unit control. Chronic motor axon loss changes are seen affecting the S1 myotomes, with active changes isolated to the medial gastrocnemius. 4. In the left lower extremity, chronic motor axon loss changes are seen affecting the S1 myotomes, without accompanied active changes.  Impression: 1. Bilateral S1 radiculopathy affecting the lower extremities, with active changes on the right. Overall, these findings are mild to moderate in degree electrically. 2. Needle electrode examination of the right lower extremity was somewhat hampered by a global pattern of incomplete motor unit recruitment despite maximal activation, which may be due to pain, effort, or central disorder of motor unit recruitment.  3. There is no evidence of a generalized sensorimotor polyneuropathy or diffuse myopathy.   ___________________________ Narda Amber    Nerve Conduction Studies Anti Sensory Summary Table   Stim Site NR Peak  (ms) Norm Peak (ms) P-T Amp (V) Norm P-T Amp  Left Sup Peroneal Anti Sensory (Ant Lat Mall)  34C  12 cm    4.2 <4.6 6.4 >3  Right Sup Peroneal Anti Sensory (Ant Lat Mall)  34C  12 cm    2.4 <4.6 7.0 >3  Left Sural Anti Sensory (Lat Mall)  34C  Calf    2.8 <4.6 3.5 >3  Right Sural Anti Sensory (Lat Mall)  34C  Calf    1.9 <4.6 4.1 >3   Motor Summary Table   Stim Site NR Onset (ms) Norm Onset (ms) O-P Amp (mV) Norm O-P Amp Site1 Site2 Delta-0 (ms) Dist (cm) Vel (m/s) Norm Vel (m/s)  Left Peroneal Motor (Ext Dig Brev)  34C  Ankle    3.3 <6.0 2.8 >2.5 B Fib Ankle 6.9 32.0 46 >40  B Fib    10.2  2.4  Poplt B Fib 1.3 8.5 65 >40  Poplt    11.5  2.4         Right Peroneal Motor (Ext Dig Brev)  34C  Ankle    2.7 <6.0 2.5 >2.5 B Fib Ankle 6.5 31.0 48 >40  B Fib    9.2  2.1  Poplt B Fib 1.7 8.5 50 >40  Poplt    10.9  2.1         Left Peroneal TA Motor (Tib Ant)  34C  Fib Head    2.5 <4.5 5.5 >3 Poplit Fib Head 1.1 6.0 55 >40  Poplit    3.6  5.4         Right Peroneal TA Motor (Tib Ant)  34C  Fib Head    2.6 <4.5 4.4 >3  Poplit Fib Head 2.0 11.0 55 >40  Poplit    4.6  4.1         Left Tibial Motor (Abd Hall Brev)  34C  Ankle    13.8 <6.0 0.0 >4 Knee Ankle 0.0 0.0  >40  Knee    13.8  0.0         Right Tibial Motor (Abd Hall Brev)  34C  Ankle NR  <6.0  >4 Knee Ankle  35.0  >40  Knee NR             EMG   Side Muscle Ins Act Fibs Psw Fasc Number Recrt Dur Dur. Amp Amp. Poly Poly. Comment  Left AntTibialis Nml Nml Nml Nml Nml Nml Nml Nml Nml Nml Nml Nml N/A  Left Gastroc Nml Nml Nml Nml 1- Rapid Some 1+ Some 1+ Nml Nml N/A  Left Flex Dig Long Nml Nml Nml Nml Nml Nml Nml Nml Nml Nml Nml Nml N/A  Left RectFemoris Nml Nml Nml Nml 1- Mod-V Nml Nml Nml Nml Nml Nml N/A  Left GluteusMed Nml Nml Nml Nml Nml Nml Nml Nml Nml Nml Nml Nml N/A  Left BicepsFemS Nml Nml Nml Nml 1- Mod-R Some 1+ Some 1+ Nml Nml N/A  Right VastusLat Nml Nml Nml Nml 2- Mod-V Nml Nml Nml Nml Nml Nml N/A  Right  BicepsFemS Nml Nml Nml Nml 2- Mod-V Some 1+ Nml Nml Nml Nml N/A  Right GluteusMed Nml Nml Nml Nml 1- Mod-V Nml Nml Nml Nml Nml Nml N/A  Right RectFemoris Nml Nml Nml Nml 3- Mod-V Nml Nml Nml Nml Nml Nml N/A  Right AntTibialis Nml Nml Nml Nml 2- Mod-R Few 1+ Nml Nml Nml Nml N/A  Right Gastroc Nml 1+ Nml Nml 2- Mod-V Some 1+ Nml Nml Nml Nml N/A  Right Flex Dig Long Nml Nml Nml Nml Nml Nml Nml Nml Nml Nml Nml Nml N/A  Right Lumbo Parasp Low Nml Nml Nml Nml NE - - - - - - - N/A      Waveforms:

## 2013-12-30 ENCOUNTER — Telehealth: Payer: Self-pay | Admitting: *Deleted

## 2013-12-30 NOTE — Telephone Encounter (Signed)
I have tried several times to contact this patient  no answer  no voicemail

## 2013-12-30 NOTE — Telephone Encounter (Signed)
-----   Message from Dudley Major, DO sent at 12/24/2013 12:16 PM EST ----- EMG does not clearly give an alternative cause of the right leg weakness.  I would like to get MRI of the lumbar spine without contrast.  If this is unremarkable, then I would get MRI of the brain without contrast. ----- Message -----    From: Alda Berthold, DO    Sent: 12/24/2013  11:17 AM      To: Dudley Major, DO

## 2014-01-05 ENCOUNTER — Ambulatory Visit: Payer: Medicare Other | Admitting: Neurology

## 2014-01-07 ENCOUNTER — Telehealth: Payer: Self-pay | Admitting: Neurology

## 2014-01-07 ENCOUNTER — Other Ambulatory Visit: Payer: Self-pay | Admitting: *Deleted

## 2014-01-07 DIAGNOSIS — R29898 Other symptoms and signs involving the musculoskeletal system: Secondary | ICD-10-CM

## 2014-01-07 DIAGNOSIS — G373 Acute transverse myelitis in demyelinating disease of central nervous system: Secondary | ICD-10-CM

## 2014-01-07 NOTE — Telephone Encounter (Signed)
Patient is asking if she can have Physical Therapy she is willing to go to [facility] outpatient rehab .Please advise

## 2014-01-07 NOTE — Telephone Encounter (Signed)
That would be fine 

## 2014-01-07 NOTE — Telephone Encounter (Signed)
Pt called wanting to know if Dr. Marinda Elk could refer her to have in-home physical therapy. C/b [phone]

## 2014-01-14 ENCOUNTER — Telehealth: Payer: Self-pay | Admitting: Neurology

## 2014-01-14 NOTE — Telephone Encounter (Signed)
Pt called following up on the physical therapy. Pt would like to to try [facility]. Please call pt # [phone]

## 2014-01-14 NOTE — Telephone Encounter (Signed)
Pt returned you call please call [phone]

## 2014-01-19 ENCOUNTER — Ambulatory Visit (HOSPITAL_COMMUNITY)
Admission: RE | Admit: 2014-01-19 | Discharge: 2014-01-19 | Disposition: A | Discharge status: Home / Self Care | Payer: Medicare Other | Source: Ambulatory Visit | Attending: Neurology | Admitting: Neurology

## 2014-01-19 DIAGNOSIS — G373 Acute transverse myelitis in demyelinating disease of central nervous system: Secondary | ICD-10-CM | POA: Diagnosis not present

## 2014-01-19 DIAGNOSIS — R29898 Other symptoms and signs involving the musculoskeletal system: Secondary | ICD-10-CM | POA: Diagnosis not present

## 2014-01-22 ENCOUNTER — Ambulatory Visit (HOSPITAL_COMMUNITY)
Admission: RE | Admit: 2014-01-22 | Discharge: 2014-01-22 | Disposition: A | Discharge status: Home / Self Care | Payer: Medicare Other | Source: Ambulatory Visit | Attending: Neurology | Admitting: Neurology

## 2014-01-22 DIAGNOSIS — M25561 Pain in right knee: Secondary | ICD-10-CM | POA: Insufficient documentation

## 2014-01-22 DIAGNOSIS — R29898 Other symptoms and signs involving the musculoskeletal system: Secondary | ICD-10-CM | POA: Diagnosis not present

## 2014-01-22 DIAGNOSIS — R262 Difficulty in walking, not elsewhere classified: Secondary | ICD-10-CM | POA: Diagnosis not present

## 2014-01-22 DIAGNOSIS — M25671 Stiffness of right ankle, not elsewhere classified: Secondary | ICD-10-CM | POA: Diagnosis not present

## 2014-01-22 NOTE — Patient Instructions (Signed)
Backward Bend (Standing)   Arch backward to make hollow of back deeper. Hold _2___ seconds. Repeat __5__ times per set. Do __1__ sets per session. Do __4__ sessions per day.  http://orth.exer.us/178   Copyright  VHI. All rights reserved.  Knee-to-Chest Stretch: Unilateral   With hand behind right knee, pull knee in to chest until a comfortable stretch is felt in lower back and buttocks. Keep back relaxed. Hold _30___ seconds. Repeat __3__ times per set. Do ____ sets per session. Do ___3_ sessions per day.  http://orth.exer.us/126   Copyright  VHI. All rights reserved.  Lower Trunk Rotation Stretch   Keeping back flat and feet together, rotate knees to left side. Hold ____ seconds. Repeat ____ times per set. Do ____ sets per session. Do ____ sessions per day.  http://orth.exer.us/122   Copyright  VHI. All rights reserved.  Hamstring Stretch: Active   Support behind right knee. Starting with knee bent, attempt to straighten knee until a comfortable stretch is felt in back of thigh. Hold __30__ seconds. Repeat __3Hamstring Set   With one leg bent slightly, push heel into bed without bending knee further. Hold ____5 seconds. Alternate legs. Repeat ___Gluteal Sets   Tighten buttocks while pressing pelvis to floor. Hold __5__ seconds. Repeat __10__ times per set. Do __1__ sets per session. Do __3__ sessions per day.  http://orth.exer.us/105   Copyright  VHI. All rights reserved.  _ times. Do ____ sessions per day.  http://[email or web address].exer.us/295   Copyright  VHI. All rights reserved.  __ times per set. Do ____ sets per session. Do ____ sessions per day.  http://orth.exer.us/158   Copyright  VHI. All rights reserved.  Isometric Abdominal   Lying on back with knees bent, tighten stomach by pressing elbows down. Hold ____ seconds. Repeat ____ times per set. Do ____ sets per session. Do ____ sessions per day.  http://orth.exer.us/1086   Copyright  VHI. All rights  reserved.  Bent Leg Lift (Hook-Lying)   Tighten stomach and slowly raise right leg ____ inches from floor. Keep trunk rigid. Hold ____ seconds. Repeat ____ times per set. Do ____ sets per session. Do ____ sessions per day.  http://orth.exer.us/1090   Copyright  VHI. All rights reserved.  Bridging   Slowly raise buttocks from floor, keeping stomach tight. Repeat ____ times per set. Do ____ sets per session. Do ____ sessions per day.  http://orth.exer.us/1096   Copyright  VHI. All rights reserved.  Heel Squeeze (Prone)   Abdomen supported, bend knees and gently squeeze heels together. Hold ____ seconds. Repeat ____ times per set. Do ____ sets per session. Do ____ sessions per day.  http://orth.exer.us/1080   Copyright  VHI. All rights reserved.  Straight Leg Raise (Prone)   Abdomen and head supported, keep left knee locked and raise leg at hip. Avoid arching low back. Repeat ____ times per set. Do ____ sets per session. Do ____ sessions per day.  http://orth.exer.us/1112   Copyright  VHI. All rights reserved.  On Elbows (Prone)   Rise up on elbows as high as possible, keeping hips on floor. Hold ____ seconds. Repeat ____ times per set. Do ____ sets per session. Do ____ sessions per day.   http://orth.exer.us/92   Copyright  VHI. All rights reserved.

## 2014-01-22 NOTE — Therapy (Signed)
[facility] [address] [place], [place], [address] Phone: [phone]   Fax:  [phone]  Physical Therapy Evaluation  Patient Details  Name: Amy Black MRN: [id number] Date of Birth: July 31, 1940  Encounter Date: 01/22/2014      PT End of Session - 01/22/14 1248    Visit Number 1   Number of Visits 16   Date for PT Re-Evaluation 02/21/14   Authorization Type U[employer] medicare   Authorization - Visit Number 1   Authorization - Number of Visits 10   PT Start Time 3299   PT Stop Time 1230   PT Time Calculation (min) 35 min   Activity Tolerance Patient tolerated treatment well      Past Medical History  Diagnosis Date  . CAD (coronary artery disease)   . Irritable bowel syndrome   . Diverticulosis of colon (without mention of hemorrhage)   . hepatic cyst   . Hyperlipidemia   . Adenosquamous carcinoma     left leg 2004 - radiation & resection  . HTN (hypertension)     PT. DENIES  . History of cardiac catheterization   . Unstable angina   . Adenocarcinoma     LEFT LEG  . Insomnia   . Vitamin D deficiency   . Adenocarcinoma of unknown primary 02/06/2011  . History of nuclear stress test 04/2011    lexiscan; normal study, no significant ischemia, low risk   . Coronary artery spasm     Past Surgical History  Procedure Laterality Date  . Resection soft tissue tumor leg / ankle radical  2004    leg lesion resection & radiation  . Diagnostic laparoscopy    . Lysis of adhesion    . Salpingoophorectomy Left   . Abdominal hysterectomy    . Pelvic laparoscopy  1992    RSO, AND LSO ON 2007  . Cardiac catheterization      coronary spasm  . Transthoracic echocardiogram  07/2012    LV cavity size mildly reduced, normal wall motion; MV with calcified annulus and mild MR; LA mildly dilated; atrial septum with increased thickness - lipomatous hypertrophy; RV systolic pressure increased (borderline pulm HTN)    There were no vitals taken for  this visit.  Visit Diagnosis:  Difficulty walking  Right leg weakness  Ankle stiffness, right  Pain in joint, lower leg, right      Subjective Assessment - 01/22/14 1208    Symptoms  [name] states the she has been having progressive knee and foot pain at night.  She states it began after a 30 minute car ride.  She states that her leg feel heavy and tight.  She states some days she can drive and other days she cant't.   [name] states that she has been to numerous MD/s and has gone to therapy at [facility] with no relief.  Therapy consisted of HMP, working on machines and electrical stimulation.            [facility] PT Assessment - 01/22/14 1206    Assessment   Medical Diagnosis Leg pain   Onset Date 01/25/13   Prior [facility] no better   Precautions   Precautions None   Restrictions   Weight Bearing Restrictions No   Balance Screen   Has the patient fallen in the past 6 months No   Has the patient had a decrease in activity level because of a fear of falling?  Yes   Is the patient  reluctant to leave their home because of a fear of falling?  No   Prior Function   Vocation Retired   Leisure shopping, read  did walk    Cognition   Overall Cognitive Status Within Functional Limits for tasks assessed   AROM   Right Ankle Dorsiflexion -12   Strength   Right Hip Flexion 3+/5   Right Hip Extension 1/5   Right Hip ABduction 3-/5   Right Knee Flexion 1/5   Right Knee Extension 5/5   Right Ankle Dorsiflexion 3-/5   Flexibility   Soft Tissue Assessment /Muscle Lenght yes   Hamstrings 145   Ambulation/Gait   Ambulation/Gait --  antalgic gait; pt has decreased dorsiflexion, circuducts Rt    Balance   Balance Assessed --  unable to SLS on Rt LE           [facility] Adult PT Treatment/Exercise - 01/22/14 1245    Exercises   Exercises Lumbar   Lumbar Exercises: Stretches   Active Hamstring Stretch 3 reps;30 seconds   Active Hamstring Stretch Limitations supine    Single Knee to Chest Stretch 3 reps;30 seconds   Standing Extension 5 reps   Lumbar Exercises: Supine   Bridge 10 reps   Straight Leg Raise 10 reps   Other Supine Lumbar Exercises hamstring set x 10   Other Supine Lumbar Exercises ankle DF x 10   Lumbar Exercises: Prone   Other Prone Lumbar Exercises glut set x 10            PT Short Term Goals - 01/22/14 1301    PT SHORT TERM GOAL #1   Title I in HEP   Time 1   Period Weeks   PT SHORT TERM GOAL #2   Title Pt pain to be no greater than a 5/10 80% of the time   Time 4   Period Weeks   PT SHORT TERM GOAL #3   Title Pt ankle dorsiflexion to improve to -5 to allow a more normalize gait   Time 4   Period Weeks   PT SHORT TERM GOAL #4   Title Pt mm to be improved by one grade    Time 4   Period Weeks          PT Long Term Goals - 01/22/14 1304    PT LONG TERM GOAL #3   Title Pt ankle ROM to be improved to 5 degrees to allow for a normalize gait    Time 6   Period Weeks   PT LONG TERM GOAL #4   Title Pt to be able to SLS x 5 seconds on Rt for decreased risk of falling   Time 6   Period Weeks   PT LONG TERM GOAL #5   Title Pt to feel comfortable walking in her home without an assistive device    Time 6   Period Weeks          Plan - 01/22/14 1311    Pt will benefit from skilled therapeutic intervention in order to improve on the following deficits Decreased balance;Abnormal gait;Decreased range of motion;Decreased strength;Difficulty walking;Pain   PT Duration 6 weeks   PT Treatment/Interventions Patient/family education;Therapeutic exercise;Therapeutic activities;Balance training   PT Next Visit Plan Pt to begin POE and press up; PROM to improve ankle dorsiflexion, piriformis stretch , heel raises functional squat and sitting knee flexion progress to sidelying abdction and functional strengthening and balance as able     PT Home Exercise Plan  given          G-Codes - 01/22/14 1306    Functional Assessment  Tool Used clinical judgement    Functional Limitation Mobility: Walking and moving around   Mobility: Walking and Moving Around Current Status [phone]) At least 40 percent but less than 60 percent impaired, limited or restricted   Mobility: Walking and Moving Around Goal Status [id number]) At least 20 percent but less than 40 percent impaired, limited or restricted         Problem List Patient Active Problem List   Diagnosis Date Noted  . Chest pain 11/28/2013  . Baker's cyst of knee 02/14/2013  . Pain in joint, ankle and foot 02/14/2013  . Pedal edema 02/14/2013  . Rhinitis 02/14/2013  . Coronary artery spasm 11/19/2012  . Transverse myelitis 10/10/2012  . CAD (coronary artery disease) 08/29/2012  . HTN (hypertension) 08/29/2012  . HLD (hyperlipidemia) 08/29/2012  . Arthritis of neck 08/29/2012  . Conjunctivitis 05/28/2012  . Weakness of right leg 02/14/2012  . Flaccid paralysis of legs 02/10/2012  . Urinary retention 02/10/2012  . Adenocarcinoma of unknown primary 02/06/2011  . Gastritis 08/29/2010  . Duodenal nodule 08/29/2010  . Dysphagia 08/21/2010  . Chest pain 08/16/2010  . Irritable bowel disease 08/16/2010  . Anxiety 05/19/2010  . CAD 12/03/2009  . GERD 12/03/2009  . ADENOCARCINOMA 12/02/2009  . ANGINA, UNSTABLE 12/02/2009  . DIVERTICULOSIS, COLON 12/02/2009  . IRRITABLE BOWEL SYNDROME 12/02/2009  . HEPATIC CYST 12/02/2009  . DIARRHEA 12/02/2009  01/22/2014, 1:11 PM  Azucena Freed PT/CLT [phone]

## 2014-01-23 ENCOUNTER — Ambulatory Visit (HOSPITAL_BASED_OUTPATIENT_CLINIC_OR_DEPARTMENT_OTHER): Payer: Medicare Other | Admitting: Hematology & Oncology

## 2014-01-23 ENCOUNTER — Other Ambulatory Visit (HOSPITAL_BASED_OUTPATIENT_CLINIC_OR_DEPARTMENT_OTHER): Payer: Medicare Other | Admitting: Lab

## 2014-01-23 ENCOUNTER — Encounter: Payer: Self-pay | Admitting: Hematology & Oncology

## 2014-01-23 DIAGNOSIS — C801 Malignant (primary) neoplasm, unspecified: Secondary | ICD-10-CM

## 2014-01-23 DIAGNOSIS — D473 Essential (hemorrhagic) thrombocythemia: Secondary | ICD-10-CM

## 2014-01-23 DIAGNOSIS — I2510 Atherosclerotic heart disease of native coronary artery without angina pectoris: Secondary | ICD-10-CM

## 2014-01-23 DIAGNOSIS — Z859 Personal history of malignant neoplasm, unspecified: Secondary | ICD-10-CM

## 2014-01-23 LAB — CBC WITH DIFFERENTIAL (CANCER CENTER ONLY)
BASO#: 0 10*3/uL (ref 0.0–0.2)
BASO%: 0.5 % (ref 0.0–2.0)
EOS%: 7.5 % — ABNORMAL HIGH (ref 0.0–7.0)
Eosinophils Absolute: 0.5 10*3/uL (ref 0.0–0.5)
HCT: 37 % (ref 34.8–46.6)
HGB: 11.9 g/dL (ref 11.6–15.9)
LYMPH#: 2.4 10*3/uL (ref 0.9–3.3)
LYMPH%: 39.4 % (ref 14.0–48.0)
MCH: 31.3 pg (ref 26.0–34.0)
MCHC: 32.2 g/dL (ref 32.0–36.0)
MCV: 97 fL (ref 81–101)
MONO#: 0.5 10*3/uL (ref 0.1–0.9)
MONO%: 8.7 % (ref 0.0–13.0)
NEUT#: 2.7 10*3/uL (ref 1.5–6.5)
NEUT%: 43.9 % (ref 39.6–80.0)
Platelets: 415 10*3/uL — ABNORMAL HIGH (ref 145–400)
RBC: 3.8 10*6/uL (ref 3.70–5.32)
RDW: 12.9 % (ref 11.1–15.7)
WBC: 6.1 10*3/uL (ref 3.9–10.0)

## 2014-01-23 LAB — COMPREHENSIVE METABOLIC PANEL
ALT: <8 U/L (ref 0–35)
AST: 13 U/L (ref 0–37)
Albumin: 4.1 g/dL (ref 3.5–5.2)
Alkaline Phosphatase: 91 U/L (ref 39–117)
BUN: 8 mg/dL (ref 6–23)
CO2: 31 mEq/L (ref 19–32)
Calcium: 10 mg/dL (ref 8.4–10.5)
Chloride: 101 mEq/L (ref 96–112)
Creatinine, Ser: 0.84 mg/dL (ref 0.50–1.10)
Glucose, Bld: 85 mg/dL (ref 70–99)
Potassium: 4.2 mEq/L (ref 3.5–5.3)
Sodium: 139 mEq/L (ref 135–145)
Total Bilirubin: 0.6 mg/dL (ref 0.2–1.2)
Total Protein: 7.4 g/dL (ref 6.0–8.3)

## 2014-01-23 LAB — LACTATE DEHYDROGENASE: LDH: 133 U/L (ref 94–250)

## 2014-01-23 NOTE — Progress Notes (Signed)
[facility]  [phone] [phone]  ID: Amy Black OB: 24-Feb-1940 MR#: [id number]#:[id number] Patient Care Team: Chipper Herb, MD as PCP - General (Family Medicine)  DIAGNOSIS :Transient thrombocytosis History transverse myelitis History of adenosquamous carcinoma of unknown primary  INTERVAL HISTORY: Amy Black is here today for a follow-up. She is doing well and has not had any problems since we saw her last.  She denies fever, chills, n/v, cough, rash, headache, dizziness, SOB, chest pain, palpitations, abdominal pain, constipation, diarrhea, blood in urine or stool.  No swelling, tenderness, numbness or tingling in her extremities.  Her appetite is good and she is staying hydrated. Her weight is stable.  Her platelets today are 415, Hgb 11.9.  She is followed by neurology for her transverse myelitis.  She is getting ready for Christmas and finishing up her shopping.   CURRENT TREATMENT: Observation  REVIEW OF SYSTEMS: All other 10 point review of systems is negative.   PAST MEDICAL HISTORY: Past Medical History  Diagnosis Date  . CAD (coronary artery disease)   . Irritable bowel syndrome   . Diverticulosis of colon (without mention of hemorrhage)   . hepatic cyst   . Hyperlipidemia   . Adenosquamous carcinoma     left leg 2004 - radiation & resection  . HTN (hypertension)     PT. DENIES  . History of cardiac catheterization   . Unstable angina   . Adenocarcinoma     LEFT LEG  . Insomnia   . Vitamin D deficiency   . Adenocarcinoma of unknown primary 02/06/2011  . History of nuclear stress test 04/2011    lexiscan; normal study, no significant ischemia, low risk   . Coronary artery spasm     PAST SURGICAL HISTORY: Past Surgical History  Procedure Laterality Date  . Resection soft tissue tumor leg / ankle radical  2004    leg lesion resection & radiation  . Diagnostic laparoscopy    . Lysis of adhesion    .  Salpingoophorectomy Left   . Abdominal hysterectomy    . Pelvic laparoscopy  1992    RSO, AND LSO ON 2007  . Cardiac catheterization      coronary spasm  . Transthoracic echocardiogram  07/2012    LV cavity size mildly reduced, normal wall motion; MV with calcified annulus and mild MR; LA mildly dilated; atrial septum with increased thickness - lipomatous hypertrophy; RV systolic pressure increased (borderline pulm HTN)    FAMILY HISTORY Family History  Problem Relation Age of Onset  . Diabetes      Multiple family members on both sides   . Coronary artery disease      Multiple family members on both sides   . Cancer Mother     Bladder  . Heart disease Mother   . Hypertension Mother   . Colon cancer Neg Hx   . Heart disease Father   . Stroke Father     GYNECOLOGIC HISTORY:  No LMP recorded. Patient is postmenopausal.   SOCIAL HISTORY: History   Social History  . Marital Status: Married    Spouse Name: N/A    Number of Children: 2  . Years of Education: N/A   Occupational History  . Disabilty   . DISABILITY    Social History Main Topics  . Smoking status: Former Smoker -- 0.50 packs/day for 4 years    Types: Cigarettes    Start date: 12/07/1968    Quit date: 11/20/1972  .  Smokeless tobacco: Never Used     Comment: quit 40 years ago  . Alcohol Use: No  . Drug Use: No  . Sexual Activity: No   Other Topics Concern  . Not on file   Social History Narrative    ADVANCED DIRECTIVES:  <no information>  HEALTH MAINTENANCE: History  Substance Use Topics  . Smoking status: Former Smoker -- 0.50 packs/day for 4 years    Types: Cigarettes    Start date: 12/07/1968    Quit date: 11/20/1972  . Smokeless tobacco: Never Used     Comment: quit 40 years ago  . Alcohol Use: No   Colonoscopy: PAP: Bone density: Lipid panel:  Allergies  Allergen Reactions  . Cymbalta [Duloxetine Hcl]     sleepiness  . Gabapentin     sleepiness  . Iodine Itching    Itching    . Ivp Dye [Iodinated Diagnostic Agents] Itching  . Zanaflex [Tizanidine Hcl]     Very drunk feeling next day    Current Outpatient Prescriptions  Medication Sig Dispense Refill  . ALPRAZolam (XANAX) 0.5 MG tablet TAKE ONE TABLET BY MOUTH THREE TIMES DAILY AS NEEDED FOR ANXIETY 90 tablet 0  . aspirin 81 MG tablet Take 1 tablet (81 mg total) by mouth daily. 30 tablet   . Cholecalciferol (VITAMIN D PO) Take by mouth. OTC    . nitroGLYCERIN (NITROSTAT) 0.4 MG SL tablet Place 1 tablet (0.4 mg total) under the tongue every 5 (five) minutes as needed. For chest pain 25 tablet 3  . pantoprazole (PROTONIX) 40 MG tablet TAKE ONE TABLET BY MOUTH TWICE DAILY. 180 tablet 0  . traMADol (ULTRAM) 50 MG tablet Take 50 mg by mouth every 12 (twelve) hours as needed.   3  . triamterene-hydrochlorothiazide (MAXZIDE-25) 37.5-25 MG per tablet Take 1 tablet by mouth 3 (three) times a week.     . verapamil (CALAN) 80 MG tablet Take 1 tablet (80 mg total) by mouth 4 (four) times daily - after meals and at bedtime. 360 tablet 3  . cetirizine (ZYRTEC) 10 MG tablet Take 1 tablet (10 mg total) by mouth 2 (two) times daily. (Patient not taking: Reported on 01/23/2014) 30 tablet 0  . cyclobenzaprine (FLEXERIL) 5 MG tablet Take 1 tablet (5 mg total) by mouth 3 (three) times daily as needed for muscle spasms. (Patient not taking: Reported on 01/23/2014) 30 tablet 1   No current facility-administered medications for this visit.    OBJECTIVE: Filed Vitals:   01/23/14 1215  BP: 126/65  Pulse: 68  Temp: 97.8 F (36.6 C)  Resp: 16    Filed Weights   01/23/14 1215  Weight: 157 lb (71.215 kg)   ECOG FS:0 - Asymptomatic Ocular: Sclerae unicteric, pupils equal, round and reactive to light Ear-nose-throat: Oropharynx clear, dentition fair Lymphatic: No cervical or supraclavicular adenopathy Lungs no rales or rhonchi, good excursion bilaterally Heart regular rate and rhythm, no murmur appreciated Abd soft, nontender,  positive bowel sounds MSK no focal spinal tenderness, no joint edema Neuro: non-focal, well-oriented, appropriate affect Breasts: Deferred  LAB RESULTS: CMP     Component Value Date/Time   NA 141 09/10/2013 1659   NA 140 08/01/2013 1246   NA 139 04/14/2010 1349   K 4.9 09/10/2013 1659   K 5.0* 04/14/2010 1349   CL 100 09/10/2013 1659   CL 97* 04/14/2010 1349   CO2 27 09/10/2013 1659   CO2 32 04/14/2010 1349   GLUCOSE 85 09/10/2013 1659   GLUCOSE 96  08/01/2013 1246   GLUCOSE 95 04/14/2010 1349   BUN 14 09/10/2013 1659   BUN 9 08/01/2013 1246   BUN 10 04/14/2010 1349   CREATININE 1.04* 09/10/2013 1659   CREATININE 1.25* 08/29/2012 1316   CALCIUM 10.1 09/10/2013 1659   CALCIUM 9.5 04/14/2010 1349   PROT 7.5 09/10/2013 1659   PROT 7.0 08/01/2013 1246   PROT 7.8 04/14/2010 1349   ALBUMIN 4.1 08/01/2013 1246   AST 13 09/10/2013 1659   AST 20 04/14/2010 1349   ALT 9 09/10/2013 1659   ALT 15 04/14/2010 1349   ALKPHOS 121* 09/10/2013 1659   ALKPHOS 84 04/14/2010 1349   BILITOT 0.2 09/10/2013 1659   BILITOT 0.60 04/14/2010 1349   GFRNONAA 55* 09/10/2013 1659   GFRNONAA 44* 08/29/2012 1316   GFRAA 63 09/10/2013 1659   GFRAA 51* 08/29/2012 1316   INo results found for: SPEP, UPEP Lab Results  Component Value Date   WBC 6.1 01/23/2014   NEUTROABS 2.7 01/23/2014   HGB 11.9 01/23/2014   HCT 37.0 01/23/2014   MCV 97 01/23/2014   PLT 415* 01/23/2014   Lab Results  Component Value Date   LABCA2 12 09/07/2006   No components found for: [id number] No results for input(s): INR in the last 168 hours.  STUDIES: None  ASSESSMENT/PLAN: Ms. Rasor is a 73 year old African American female with history of adenosquamous carcinoma of unknown primary. There has been no evidence of recurrent malignancy.  Her blood counts have normalized. The thrombocytosis certainly could be reactive. Her platelets today are 415. She is doing well.  We will see her back in 6 months for labs and  follow-up.  She knows to call here with any questions or concerns and to go to the ED in the event of an emergency. We can certainly see her sooner if need be.   Eliezer Bottom, NP 01/23/2014 12:57 PM

## 2014-01-26 ENCOUNTER — Encounter (HOSPITAL_COMMUNITY): Payer: Medicare Other | Admitting: Physical Therapy

## 2014-01-28 ENCOUNTER — Ambulatory Visit: Payer: Medicare Other | Admitting: Family Medicine

## 2014-02-03 ENCOUNTER — Encounter (HOSPITAL_COMMUNITY): Payer: Medicare Other | Admitting: Physical Therapy

## 2014-02-03 ENCOUNTER — Other Ambulatory Visit: Payer: Self-pay | Admitting: Family Medicine

## 2014-02-04 NOTE — Telephone Encounter (Signed)
Last sen 12/25/13 [facility] [place]  If approved route to nurse to call into [facility]  [phone]

## 2014-02-04 NOTE — Telephone Encounter (Signed)
Please advise 

## 2014-02-04 NOTE — Telephone Encounter (Signed)
rx called in to [facility]

## 2014-02-09 ENCOUNTER — Ambulatory Visit (HOSPITAL_COMMUNITY)
Admission: RE | Admit: 2014-02-09 | Discharge: 2014-02-09 | Disposition: A | Discharge status: Home / Self Care | Payer: Medicare Other | Source: Ambulatory Visit | Attending: Family Medicine | Admitting: Family Medicine

## 2014-02-09 DIAGNOSIS — R29898 Other symptoms and signs involving the musculoskeletal system: Secondary | ICD-10-CM | POA: Diagnosis not present

## 2014-02-09 DIAGNOSIS — G959 Disease of spinal cord, unspecified: Secondary | ICD-10-CM

## 2014-02-09 DIAGNOSIS — R262 Difficulty in walking, not elsewhere classified: Secondary | ICD-10-CM

## 2014-02-09 DIAGNOSIS — M25561 Pain in right knee: Secondary | ICD-10-CM | POA: Diagnosis not present

## 2014-02-09 DIAGNOSIS — M25671 Stiffness of right ankle, not elsewhere classified: Secondary | ICD-10-CM | POA: Diagnosis not present

## 2014-02-09 NOTE — Therapy (Signed)
[facility], [place], [address] Phone: [phone]   Fax:  [phone]  Physical Therapy Treatment  Patient Details  Name: Amy Black MRN: [id number] Date of Birth: 01-27-1941  Encounter Date: 02/09/2014      PT End of Session - 02/09/14 1411    Visit Number 2   Number of Visits 16   Date for PT Re-Evaluation 02/21/14   Authorization Type [employer]HC medicare   Authorization - Visit Number 2   Authorization - Number of Visits 10   PT Start Time 3559   PT Stop Time 7416   PT Time Calculation (min) 45 min   Activity Tolerance Patient tolerated treatment well   Behavior During Therapy [facility] for tasks assessed/performed      Past Medical History  Diagnosis Date  . CAD (coronary artery disease)   . Irritable bowel syndrome   . Diverticulosis of colon (without mention of hemorrhage)   . hepatic cyst   . Hyperlipidemia   . Adenosquamous carcinoma     left leg 2004 - radiation & resection  . HTN (hypertension)     PT. DENIES  . History of cardiac catheterization   . Unstable angina   . Adenocarcinoma     LEFT LEG  . Insomnia   . Vitamin D deficiency   . Adenocarcinoma of unknown primary 02/06/2011  . History of nuclear stress test 04/2011    lexiscan; normal study, no significant ischemia, low risk   . Coronary artery spasm     Past Surgical History  Procedure Laterality Date  . Resection soft tissue tumor leg / ankle radical  2004    leg lesion resection & radiation  . Diagnostic laparoscopy    . Lysis of adhesion    . Salpingoophorectomy Left   . Abdominal hysterectomy    . Pelvic laparoscopy  1992    RSO, AND LSO ON 2007  . Cardiac catheterization      coronary spasm  . Transthoracic echocardiogram  07/2012    LV cavity size mildly reduced, normal wall motion; MV with calcified annulus and mild MR; LA mildly dilated; atrial septum with increased thickness - lipomatous hypertrophy; RV systolic pressure  increased (borderline pulm HTN)    There were no vitals taken for this visit.  Visit Diagnosis:  Difficulty walking  Right leg weakness  Ankle stiffness, right  Pain in joint, lower leg, right  Myelopathy      Subjective Assessment - 02/09/14 1314    Symptoms Pt reports 6/10 pain in Rt thigh area.  States it gets worse at night and into her feet and ankles.  States she feels like somthing is holding her leg.  States she has missed the past couple weeks of appointments due to conflicting MD appointments.  Pt reports complaince with HEP.     Currently in Pain? Yes   Pain Score 6                     [facility] Adult PT Treatment/Exercise - 02/09/14 1352    Lumbar Exercises: Stretches   Active Hamstring Stretch 3 reps;30 seconds   Active Hamstring Stretch Limitations supine   Single Knee to Chest Stretch 3 reps;30 seconds   Prone on Elbows Stretch 60 seconds   Press Ups 5 reps   Piriformis Stretch 2 reps;30 seconds   Piriformis Stretch Limitations seated   Lumbar Exercises: Seated   Other Seated Lumbar Exercises heel & toe raises  10 reps each   Other Seated Lumbar Exercises Rt ankle Inv/ev with towel and pick ups   Lumbar Exercises: Supine   Bridge 10 reps   Straight Leg Raise 10 reps   Lumbar Exercises: Sidelying   Hip Abduction 10 reps;Limitations   Hip Abduction Weights (lbs) Right   Lumbar Exercises: Prone   Straight Leg Raise 10 reps   Straight Leg Raises Limitations bilaterally   Other Prone Lumbar Exercises hamstring curls 10 reps   Other Prone Lumbar Exercises knee flexion PROM for Rt LE                  PT Short Term Goals - 01/22/14 1301    PT SHORT TERM GOAL #1   Title I in HEP   Time 1   Period Weeks   PT SHORT TERM GOAL #2   Title Pt pain to be no greater than a 5/10 80% of the time   Time 4   Period Weeks   PT SHORT TERM GOAL #3   Title Pt ankle dorsiflexion to improve to -5 to allow a more normalize gait   Time 4   Period Weeks    PT SHORT TERM GOAL #4   Title Pt mm to be improved by one grade    Time 4   Period Weeks           PT Long Term Goals - 01/22/14 1304    PT LONG TERM GOAL #3   Title Pt ankle ROM to be improved to 5 degrees to allow for a normalize gait    Time 6   Period Weeks   PT LONG TERM GOAL #4   Title Pt to be able to SLS x 5 seconds on Rt for decreased risk of falling   Time 6   Period Weeks   PT LONG TERM GOAL #5   Title Pt to feel comfortable walking in her home without an assistive device    Time 6   Period Weeks               Plan - 02/09/14 1433    Clinical Impression Statement Pt just returning following evaluation of 2 weeks prior.  Progressed exercises adding seated heel/toe raise, Rt ankle towel exercises and mat actvities to increase ankle ROM and lumbar mobility.  Completed PROM for Rt knee flexion and ankle.  Pt able to complete all actvities without pain, only discomfort with PROM for knee flexion.  Pt encouraged to continue HEP.     PT Next Visit Plan Add heel/toe raises, functional squats and lunges.  Progress to balance actvities as able.    PT Home Exercise Plan given        Problem List Patient Active Problem List   Diagnosis Date Noted  . Chest pain 11/28/2013  . Baker's cyst of knee 02/14/2013  . Pain in joint, ankle and foot 02/14/2013  . Pedal edema 02/14/2013  . Rhinitis 02/14/2013  . Coronary artery spasm 11/19/2012  . Transverse myelitis 10/10/2012  . CAD (coronary artery disease) 08/29/2012  . HTN (hypertension) 08/29/2012  . HLD (hyperlipidemia) 08/29/2012  . Arthritis of neck 08/29/2012  . Conjunctivitis 05/28/2012  . Weakness of right leg 02/14/2012  . Flaccid paralysis of legs 02/10/2012  . Urinary retention 02/10/2012  . Adenocarcinoma of unknown primary 02/06/2011  . Gastritis 08/29/2010  . Duodenal nodule 08/29/2010  . Dysphagia 08/21/2010  . Chest pain 08/16/2010  . Irritable bowel disease 08/16/2010  . Anxiety  05/19/2010   . CAD 12/03/2009  . GERD 12/03/2009  . ADENOCARCINOMA 12/02/2009  . ANGINA, UNSTABLE 12/02/2009  . DIVERTICULOSIS, COLON 12/02/2009  . IRRITABLE BOWEL SYNDROME 12/02/2009  . HEPATIC CYST 12/02/2009  . DIARRHEA 12/02/2009    Teena Irani, PTA/CLT [phone] 02/09/2014, 2:38 PM  [facility] [address] [place], [place], [address] Phone: [phone]   Fax:  [phone]

## 2014-02-11 ENCOUNTER — Ambulatory Visit (HOSPITAL_COMMUNITY)
Admission: RE | Admit: 2014-02-11 | Discharge: 2014-02-11 | Disposition: A | Discharge status: Home / Self Care | Payer: Medicare Other | Source: Ambulatory Visit | Attending: Family Medicine | Admitting: Family Medicine

## 2014-02-11 DIAGNOSIS — R29898 Other symptoms and signs involving the musculoskeletal system: Secondary | ICD-10-CM

## 2014-02-11 DIAGNOSIS — M25561 Pain in right knee: Secondary | ICD-10-CM | POA: Diagnosis not present

## 2014-02-11 DIAGNOSIS — R262 Difficulty in walking, not elsewhere classified: Secondary | ICD-10-CM | POA: Diagnosis not present

## 2014-02-11 DIAGNOSIS — M25671 Stiffness of right ankle, not elsewhere classified: Secondary | ICD-10-CM

## 2014-02-11 NOTE — Therapy (Signed)
[place], [place], [address] Phone: [phone]   Fax:  [phone]  Physical Therapy Treatment  Patient Details  Name: Amy Black MRN: [id number] Date of Birth: 1940-06-18  Encounter Date: 02/11/2014      PT End of Session - 02/11/14 1347    Visit Number 3   Number of Visits 16   Date for PT Re-Evaluation 02/21/14   Authorization Type [employer] medicare   Authorization - Visit Number 3   Authorization - Number of Visits 10   PT Start Time 1607   PT Stop Time 1342   PT Time Calculation (min) 39 min      Past Medical History  Diagnosis Date  . CAD (coronary artery disease)   . Irritable bowel syndrome   . Diverticulosis of colon (without mention of hemorrhage)   . hepatic cyst   . Hyperlipidemia   . Adenosquamous carcinoma     left leg 2004 - radiation & resection  . HTN (hypertension)     PT. DENIES  . History of cardiac catheterization   . Unstable angina   . Adenocarcinoma     LEFT LEG  . Insomnia   . Vitamin D deficiency   . Adenocarcinoma of unknown primary 02/06/2011  . History of nuclear stress test 04/2011    lexiscan; normal study, no significant ischemia, low risk   . Coronary artery spasm     Past Surgical History  Procedure Laterality Date  . Resection soft tissue tumor leg / ankle radical  2004    leg lesion resection & radiation  . Diagnostic laparoscopy    . Lysis of adhesion    . Salpingoophorectomy Left   . Abdominal hysterectomy    . Pelvic laparoscopy  1992    RSO, AND LSO ON 2007  . Cardiac catheterization      coronary spasm  . Transthoracic echocardiogram  07/2012    LV cavity size mildly reduced, normal wall motion; MV with calcified annulus and mild MR; LA mildly dilated; atrial septum with increased thickness - lipomatous hypertrophy; RV systolic pressure increased (borderline pulm HTN)    There were no vitals taken for this visit.  Visit Diagnosis:  Difficulty  walking  Right leg weakness  Ankle stiffness, right  Pain in joint, lower leg, right      Subjective Assessment - 02/11/14 1303    Symptoms Pt states she is doing her exercises twice a day.  She has difficulty doing the exercises for her ankle              [facility] Adult PT Treatment/Exercise - 02/11/14 0001    Lumbar Exercises: Stretches   Active Hamstring Stretch 3 reps;30 seconds   Active Hamstring Stretch Limitations supine   Single Knee to Chest Stretch 3 reps;30 seconds   Prone on Elbows Stretch 60 seconds   Press Ups 5 reps   Lumbar Exercises: Standing   Heel Raises 10 reps   Functional Squats 10 reps   Other Standing Lumbar Exercises 3 D hip excursion    Lumbar Exercises: Seated   Other Seated Lumbar Exercises towel inversin/eversion Rt LE x 10 @   Other Seated Lumbar Exercises Rt heel slide x 10 for hamstrength and increased DF    Lumbar Exercises: Supine   Bridge 15 reps   Straight Leg Raise 10 reps   Straight Leg Raises Limitations floating    Lumbar Exercises: Sidelying   Clam 10 reps  Hip Abduction 10 reps   Lumbar Exercises: Prone   Straight Leg Raise 10 reps   Straight Leg Raises Limitations bilaterally   Other Prone Lumbar Exercises hamstring curls 10 reps   Other Prone Lumbar Exercises AA for Rt             PT Short Term Goals - 01/22/14 1301    PT SHORT TERM GOAL #1   Title I in HEP   Time 1   Period Weeks   PT SHORT TERM GOAL #2   Title Pt pain to be no greater than a 5/10 80% of the time   Time 4   Period Weeks   PT SHORT TERM GOAL #3   Title Pt ankle dorsiflexion to improve to -5 to allow a more normalize gait   Time 4   Period Weeks   PT SHORT TERM GOAL #4   Title Pt mm to be improved by one grade    Time 4   Period Weeks           PT Long Term Goals - 01/22/14 1304    PT LONG TERM GOAL #3   Title Pt ankle ROM to be improved to 5 degrees to allow for a normalize gait    Time 6   Period Weeks   PT LONG TERM GOAL #4    Title Pt to be able to SLS x 5 seconds on Rt for decreased risk of falling   Time 6   Period Weeks   PT LONG TERM GOAL #5   Title Pt to feel comfortable walking in her home without an assistive device    Time 6   Period Weeks               Plan - 02/11/14 1347    Clinical Impression Statement Pt showes improvement in ankle and hamstring strength.  Pt continues to need verbal and tactile cues to perform exercises correctly.  Progressed pt to standing exercises.    PT Next Visit Plan begin lunges and sit to stand activites        Problem List Patient Active Problem List   Diagnosis Date Noted  . Chest pain 11/28/2013  . Baker's cyst of knee 02/14/2013  . Pain in joint, ankle and foot 02/14/2013  . Pedal edema 02/14/2013  . Rhinitis 02/14/2013  . Coronary artery spasm 11/19/2012  . Transverse myelitis 10/10/2012  . CAD (coronary artery disease) 08/29/2012  . HTN (hypertension) 08/29/2012  . HLD (hyperlipidemia) 08/29/2012  . Arthritis of neck 08/29/2012  . Conjunctivitis 05/28/2012  . Weakness of right leg 02/14/2012  . Flaccid paralysis of legs 02/10/2012  . Urinary retention 02/10/2012  . Adenocarcinoma of unknown primary 02/06/2011  . Gastritis 08/29/2010  . Duodenal nodule 08/29/2010  . Dysphagia 08/21/2010  . Chest pain 08/16/2010  . Irritable bowel disease 08/16/2010  . Anxiety 05/19/2010  . CAD 12/03/2009  . GERD 12/03/2009  . ADENOCARCINOMA 12/02/2009  . ANGINA, UNSTABLE 12/02/2009  . DIVERTICULOSIS, COLON 12/02/2009  . IRRITABLE BOWEL SYNDROME 12/02/2009  . HEPATIC CYST 12/02/2009  . DIARRHEA 12/02/2009    Petar Mucci,Amy Black PT 02/11/2014, 1:49 PM  [facility] [address] [place], [place], [address] Phone: [phone]   Fax:  [phone]

## 2014-02-13 DIAGNOSIS — N281 Cyst of kidney, acquired: Secondary | ICD-10-CM | POA: Diagnosis not present

## 2014-02-13 DIAGNOSIS — R351 Nocturia: Secondary | ICD-10-CM | POA: Diagnosis not present

## 2014-02-13 DIAGNOSIS — N1330 Unspecified hydronephrosis: Secondary | ICD-10-CM | POA: Diagnosis not present

## 2014-02-13 DIAGNOSIS — R339 Retention of urine, unspecified: Secondary | ICD-10-CM | POA: Diagnosis not present

## 2014-02-13 DIAGNOSIS — R3912 Poor urinary stream: Secondary | ICD-10-CM | POA: Diagnosis not present

## 2014-02-16 ENCOUNTER — Ambulatory Visit (HOSPITAL_COMMUNITY)
Admission: RE | Admit: 2014-02-16 | Discharge: 2014-02-16 | Disposition: A | Discharge status: Home / Self Care | Payer: Medicare Other | Source: Ambulatory Visit | Attending: Family Medicine | Admitting: Family Medicine

## 2014-02-16 DIAGNOSIS — R29898 Other symptoms and signs involving the musculoskeletal system: Secondary | ICD-10-CM | POA: Diagnosis not present

## 2014-02-16 DIAGNOSIS — R262 Difficulty in walking, not elsewhere classified: Secondary | ICD-10-CM

## 2014-02-16 DIAGNOSIS — M25671 Stiffness of right ankle, not elsewhere classified: Secondary | ICD-10-CM

## 2014-02-16 DIAGNOSIS — G959 Disease of spinal cord, unspecified: Secondary | ICD-10-CM

## 2014-02-16 DIAGNOSIS — M25561 Pain in right knee: Secondary | ICD-10-CM | POA: Diagnosis not present

## 2014-02-16 NOTE — Therapy (Signed)
[place], [place], [address] Phone: [phone]   Fax:  [phone]  Physical Therapy Treatment  Patient Details  Name: Amy Black MRN: [id number] Date of Birth: October 26, 1940 Referring Provider:  Chipper Herb, MD  Encounter Date: 02/16/2014      PT End of Session - 02/16/14 1208    Visit Number 4   Number of Visits 16   Date for PT Re-Evaluation 02/21/14   Authorization Type [employer] medicare   Authorization - Visit Number 4   Authorization - Number of Visits 10   PT Start Time 1104   PT Stop Time 1150   PT Time Calculation (min) 46 min   Activity Tolerance Patient tolerated treatment well   Behavior During Therapy [facility] for tasks assessed/performed      Past Medical History  Diagnosis Date  . CAD (coronary artery disease)   . Irritable bowel syndrome   . Diverticulosis of colon (without mention of hemorrhage)   . hepatic cyst   . Hyperlipidemia   . Adenosquamous carcinoma     left leg 2004 - radiation & resection  . HTN (hypertension)     PT. DENIES  . History of cardiac catheterization   . Unstable angina   . Adenocarcinoma     LEFT LEG  . Insomnia   . Vitamin D deficiency   . Adenocarcinoma of unknown primary 02/06/2011  . History of nuclear stress test 04/2011    lexiscan; normal study, no significant ischemia, low risk   . Coronary artery spasm     Past Surgical History  Procedure Laterality Date  . Resection soft tissue tumor leg / ankle radical  2004    leg lesion resection & radiation  . Diagnostic laparoscopy    . Lysis of adhesion    . Salpingoophorectomy Left   . Abdominal hysterectomy    . Pelvic laparoscopy  1992    RSO, AND LSO ON 2007  . Cardiac catheterization      coronary spasm  . Transthoracic echocardiogram  07/2012    LV cavity size mildly reduced, normal wall motion; MV with calcified annulus and mild MR; LA mildly dilated; atrial septum with increased thickness -  lipomatous hypertrophy; RV systolic pressure increased (borderline pulm HTN)    There were no vitals taken for this visit.  Visit Diagnosis:  No diagnosis found.      Subjective Assessment - 02/16/14 1111    Symptoms Pt reports no pain today and is completing her HEP.  Pt states her goal is to be able to get her Rt LE into her car easier.                    [facility] Adult PT Treatment/Exercise - 02/16/14 1107    Lumbar Exercises: Stretches   Active Hamstring Stretch 3 reps;30 seconds   Active Hamstring Stretch Limitations standing 14" step   ITB Stretch Limitations   ITB Stretch Limitations plantar fascia stretch Rt LE 3X30"   Piriformis Stretch 2 reps;30 seconds   Piriformis Stretch Limitations seated   Lumbar Exercises: Standing   Heel Raises 15 reps   Heel Raises Limitations toeraises 15 reps   Functional Squats 10 reps;Limitations   Functional Squats Limitations no UE's   Forward Lunge 10 reps   Forward Lunge Limitations onto 4" box no UE's   Other Standing Lumbar Exercises Toe tapping to 8" box 10 reps   Lumbar Exercises: Seated   Sit  to Stand 10 reps   Sit to Stand Limitations no UE's   Other Seated Lumbar Exercises towel inversion/eversion Rt LE x 3 each   Other Seated Lumbar Exercises Rt heel slide x 10 for hamstrength and increased DF    Manual Therapy   Manual Therapy Passive ROM;Myofascial release   Myofascial Release Rt plantar fascia   Passive ROM Rt gastroc                  PT Short Term Goals - 01/22/14 1301    PT SHORT TERM GOAL #1   Title I in HEP   Time 1   Period Weeks   PT SHORT TERM GOAL #2   Title Pt pain to be no greater than a 5/10 80% of the time   Time 4   Period Weeks   PT SHORT TERM GOAL #3   Title Pt ankle dorsiflexion to improve to -5 to allow a more normalize gait   Time 4   Period Weeks   PT SHORT TERM GOAL #4   Title Pt mm to be improved by one grade    Time 4   Period Weeks           PT Long Term  Goals - 01/22/14 1304    PT LONG TERM GOAL #3   Title Pt ankle ROM to be improved to 5 degrees to allow for a normalize gait    Time 6   Period Weeks   PT LONG TERM GOAL #4   Title Pt to be able to SLS x 5 seconds on Rt for decreased risk of falling   Time 6   Period Weeks   PT LONG TERM GOAL #5   Title Pt to feel comfortable walking in her home without an assistive device    Time 6   Period Weeks               Plan - 02/16/14 1209    Clinical Impression Statement Pt with difficulty getting Rt LE into car and reports tightness/pain in her Rt plantar foot when she lays down to sleep at night.   Added 8" box toe taps working on eliminating circumduction to focus on hip flexion strengthening.  Also added forward lunges and sit to stand to increase LE strength.    Patient with noted tightness in plantar aspect of Rt foot and decreased ROM.  Added manual techniques including myofascial to plantar fascia and PROM for dorsiflexion.  Pt also educated with plantar fascia stretch and instructed to complete 3X daily.  Pt able to demonstrate correctly.     PT Next Visit Plan continue to progress strength of LE's and increase Rt DF ROM.  Progress with decreased UE assistance with therex.         Problem List Patient Active Problem List   Diagnosis Date Noted  . Chest pain 11/28/2013  . Baker's cyst of knee 02/14/2013  . Pain in joint, ankle and foot 02/14/2013  . Pedal edema 02/14/2013  . Rhinitis 02/14/2013  . Coronary artery spasm 11/19/2012  . Transverse myelitis 10/10/2012  . CAD (coronary artery disease) 08/29/2012  . HTN (hypertension) 08/29/2012  . HLD (hyperlipidemia) 08/29/2012  . Arthritis of neck 08/29/2012  . Conjunctivitis 05/28/2012  . Weakness of right leg 02/14/2012  . Flaccid paralysis of legs 02/10/2012  . Urinary retention 02/10/2012  . Adenocarcinoma of unknown primary 02/06/2011  . Gastritis 08/29/2010  . Duodenal nodule 08/29/2010  . Dysphagia 08/21/2010   .  Chest pain 08/16/2010  . Irritable bowel disease 08/16/2010  . Anxiety 05/19/2010  . CAD 12/03/2009  . GERD 12/03/2009  . ADENOCARCINOMA 12/02/2009  . ANGINA, UNSTABLE 12/02/2009  . DIVERTICULOSIS, COLON 12/02/2009  . IRRITABLE BOWEL SYNDROME 12/02/2009  . HEPATIC CYST 12/02/2009  . DIARRHEA 12/02/2009    Teena Irani, PTA/CLT [phone] 02/16/2014, 12:15 PM  [facility] [address] [place], [place], [address] Phone: [phone]   Fax:  [phone]

## 2014-02-18 ENCOUNTER — Ambulatory Visit (HOSPITAL_COMMUNITY): Payer: Medicare Other | Admitting: Physical Therapy

## 2014-02-23 ENCOUNTER — Ambulatory Visit (HOSPITAL_COMMUNITY)
Admission: RE | Admit: 2014-02-23 | Discharge: 2014-02-23 | Disposition: A | Discharge status: Home / Self Care | Payer: Medicare Other | Source: Ambulatory Visit | Attending: Neurology | Admitting: Neurology

## 2014-02-23 ENCOUNTER — Ambulatory Visit (HOSPITAL_COMMUNITY): Payer: Medicare Other | Admitting: Physical Therapy

## 2014-02-23 DIAGNOSIS — R29898 Other symptoms and signs involving the musculoskeletal system: Secondary | ICD-10-CM | POA: Diagnosis not present

## 2014-02-23 DIAGNOSIS — M25671 Stiffness of right ankle, not elsewhere classified: Secondary | ICD-10-CM | POA: Diagnosis not present

## 2014-02-23 DIAGNOSIS — R262 Difficulty in walking, not elsewhere classified: Secondary | ICD-10-CM | POA: Diagnosis not present

## 2014-02-23 DIAGNOSIS — M25561 Pain in right knee: Secondary | ICD-10-CM

## 2014-02-23 NOTE — Therapy (Signed)
[place], [place], [address] Phone: [phone]   Fax:  [phone]  Physical Therapy Treatment  Patient Details  Name: Amy Black MRN: [id number] Date of Birth: 01/29/1941 Referring Provider:  Dudley Major, DO  Encounter Date: 02/23/2014      PT End of Session - 02/23/14 1151    Visit Number 5   Number of Visits 16   Date for PT Re-Evaluation 03/23/14   Authorization Type [employer] medicare   Authorization - Visit Number 5   Authorization - Number of Visits 10   PT Start Time 1102   PT Stop Time 1148   PT Time Calculation (min) 46 min   Equipment Utilized During Treatment Gait belt   Activity Tolerance Patient tolerated treatment well   Behavior During Therapy [facility] for tasks assessed/performed      Past Medical History  Diagnosis Date  . CAD (coronary artery disease)   . Irritable bowel syndrome   . Diverticulosis of colon (without mention of hemorrhage)   . hepatic cyst   . Hyperlipidemia   . Adenosquamous carcinoma     left leg 2004 - radiation & resection  . HTN (hypertension)     PT. DENIES  . History of cardiac catheterization   . Unstable angina   . Adenocarcinoma     LEFT LEG  . Insomnia   . Vitamin D deficiency   . Adenocarcinoma of unknown primary 02/06/2011  . History of nuclear stress test 04/2011    lexiscan; normal study, no significant ischemia, low risk   . Coronary artery spasm     Past Surgical History  Procedure Laterality Date  . Resection soft tissue tumor leg / ankle radical  2004    leg lesion resection & radiation  . Diagnostic laparoscopy    . Lysis of adhesion    . Salpingoophorectomy Left   . Abdominal hysterectomy    . Pelvic laparoscopy  1992    RSO, AND LSO ON 2007  . Cardiac catheterization      coronary spasm  . Transthoracic echocardiogram  07/2012    LV cavity size mildly reduced, normal wall motion; MV with calcified annulus and mild MR; LA mildly  dilated; atrial septum with increased thickness - lipomatous hypertrophy; RV systolic pressure increased (borderline pulm HTN)    There were no vitals taken for this visit.  Visit Diagnosis:  Difficulty walking  Right leg weakness  Ankle stiffness, right  Pain in joint, lower leg, right      Subjective Assessment - 02/23/14 1105    Symptoms No complaints of pain, only stiffness in the Rt thigh and bottom of foot.     Currently in Pain? No/denies          [facility] PT Assessment - 02/23/14 1115    Assessment   Medical Diagnosis Leg pain   Onset Date 01/25/13   Next MD Visit Tomi Likens none scheduled   Flexibility   Soft Tissue Assessment /Muscle Lenght yes   Quadriceps Rt 7 1/2in, Lt 4 inch to glute                  [facility] Adult PT Treatment/Exercise - 02/23/14 1102    Exercises   Exercises Lumbar   Lumbar Exercises: Stretches   Active Hamstring Stretch 3 reps;30 seconds   Active Hamstring Stretch Limitations standing 14" step   Passive Hamstring Stretch 3 reps;30 seconds   Passive Hamstring Stretch Limitations Gastroc Stretch, Slantboard  Press Ups 5 reps   Sports administrator 2 reps;30 seconds   Quad Stretch Limitations Prone with rope   Piriformis Stretch 2 reps;30 seconds   Piriformis Stretch Limitations seated, with plantar fascia stretch   Lumbar Exercises: Standing   Heel Raises 15 reps  1 HHA   Heel Raises Limitations toeraises 15 reps  2 HHA   Functional Squats 15 reps   Forward Lunge 10 reps   Forward Lunge Limitations onto 4" box no UE's   Other Standing Lumbar Exercises Monster Walk, RTB 10' x2 side/backward   Other Standing Lumbar Exercises Toe tapping to 8" box 10 reps, no UE   Lumbar Exercises: Seated   Sit to Stand 15 reps   Sit to Stand Limitations no UE, 17" Step   Lumbar Exercises: Supine   Bridge 20 reps   Lumbar Exercises: Prone   Straight Leg Raise 15 reps   Straight Leg Raises Limitations 3x5 for Rt, x15 for Lt   Other Prone Lumbar  Exercises hamstring curls 2x10 reps   Other Prone Lumbar Exercises AA for Rt    Manual Therapy   Manual Therapy Passive ROM   Passive ROM Rt gastroc into dorsiflexion, and Rt foot for plantarflexion                  PT Short Term Goals - 01/22/14 1301    PT SHORT TERM GOAL #1   Title I in HEP   Time 1   Period Weeks   PT SHORT TERM GOAL #2   Title Pt pain to be no greater than a 5/10 80% of the time   Time 4   Period Weeks   PT SHORT TERM GOAL #3   Title Pt ankle dorsiflexion to improve to -5 to allow a more normalize gait   Time 4   Period Weeks   PT SHORT TERM GOAL #4   Title Pt mm to be improved by one grade    Time 4   Period Weeks           PT Long Term Goals - 01/22/14 1304    PT LONG TERM GOAL #3   Title Pt ankle ROM to be improved to 5 degrees to allow for a normalize gait    Time 6   Period Weeks   PT LONG TERM GOAL #4   Title Pt to be able to SLS x 5 seconds on Rt for decreased risk of falling   Time 6   Period Weeks   PT LONG TERM GOAL #5   Title Pt to feel comfortable walking in her home without an assistive device    Time 6   Period Weeks               Plan - 02/23/14 1154    Clinical Impression Statement Updated POC date to match with certification date.  No complaitns of pain today, and pt extremely motivated during treatment session to complete all exercises.  Pt did require some AAROM with hamstring curls and multiple sets of hip extension on the Rt to complete exercises with increased reps today.  Added gastroc stretch on slantboard to increase muscle length of calf and decrease tension to plantar fascia; also, addition of quad stretch with measuremnet taken with deficits on Rt side compared to Lt side.  VC and encouragement to complete standing exercises with decreased use of UE, and pt able to complete toe taps, STS, and lunges without UE though did require UE  with HR/TR.    Pt will benefit from skilled therapeutic intervention in  order to improve on the following deficits Decreased balance;Abnormal gait;Decreased range of motion;Decreased strength;Difficulty walking;Pain   PT Treatment/Interventions Patient/family education;Therapeutic exercise;Therapeutic activities;Balance training   PT Next Visit Plan continue to progress strength of LE's and increase Rt DF ROM.          Problem List Patient Active Problem List   Diagnosis Date Noted  . Chest pain 11/28/2013  . Baker's cyst of knee 02/14/2013  . Pain in joint, ankle and foot 02/14/2013  . Pedal edema 02/14/2013  . Rhinitis 02/14/2013  . Coronary artery spasm 11/19/2012  . Transverse myelitis 10/10/2012  . CAD (coronary artery disease) 08/29/2012  . HTN (hypertension) 08/29/2012  . HLD (hyperlipidemia) 08/29/2012  . Arthritis of neck 08/29/2012  . Conjunctivitis 05/28/2012  . Weakness of right leg 02/14/2012  . Flaccid paralysis of legs 02/10/2012  . Urinary retention 02/10/2012  . Adenocarcinoma of unknown primary 02/06/2011  . Gastritis 08/29/2010  . Duodenal nodule 08/29/2010  . Dysphagia 08/21/2010  . Chest pain 08/16/2010  . Irritable bowel disease 08/16/2010  . Anxiety 05/19/2010  . CAD 12/03/2009  . GERD 12/03/2009  . ADENOCARCINOMA 12/02/2009  . ANGINA, UNSTABLE 12/02/2009  . DIVERTICULOSIS, COLON 12/02/2009  . IRRITABLE BOWEL SYNDROME 12/02/2009  . HEPATIC CYST 12/02/2009  . DIARRHEA 12/02/2009    Lonna Cobb, DPT [phone]   02/23/2014, 11:57 AM  [place], [place], [address] Phone: [phone]   Fax:  [phone]

## 2014-02-25 DIAGNOSIS — R3912 Poor urinary stream: Secondary | ICD-10-CM | POA: Diagnosis not present

## 2014-02-25 DIAGNOSIS — N200 Calculus of kidney: Secondary | ICD-10-CM | POA: Diagnosis not present

## 2014-02-25 DIAGNOSIS — R339 Retention of urine, unspecified: Secondary | ICD-10-CM | POA: Diagnosis not present

## 2014-02-25 DIAGNOSIS — N281 Cyst of kidney, acquired: Secondary | ICD-10-CM | POA: Diagnosis not present

## 2014-02-26 ENCOUNTER — Ambulatory Visit (HOSPITAL_COMMUNITY): Admission: RE | Admit: 2014-02-26 | Payer: Medicare Other | Source: Ambulatory Visit | Admitting: Physical Therapy

## 2014-03-03 DIAGNOSIS — H25811 Combined forms of age-related cataract, right eye: Secondary | ICD-10-CM | POA: Diagnosis not present

## 2014-03-03 DIAGNOSIS — H25011 Cortical age-related cataract, right eye: Secondary | ICD-10-CM | POA: Diagnosis not present

## 2014-03-03 DIAGNOSIS — Z0289 Encounter for other administrative examinations: Secondary | ICD-10-CM

## 2014-03-03 DIAGNOSIS — H2511 Age-related nuclear cataract, right eye: Secondary | ICD-10-CM | POA: Diagnosis not present

## 2014-03-05 ENCOUNTER — Other Ambulatory Visit: Payer: Self-pay | Admitting: Family Medicine

## 2014-03-05 NOTE — Telephone Encounter (Signed)
Please advise on refill.  Patient last seen at [facility] 12/15/13.

## 2014-03-06 ENCOUNTER — Telehealth: Payer: Self-pay | Admitting: *Deleted

## 2014-03-06 NOTE — Telephone Encounter (Signed)
RX called into [facility]

## 2014-03-23 ENCOUNTER — Encounter (HOSPITAL_COMMUNITY): Payer: Self-pay | Admitting: Physical Therapy

## 2014-03-25 ENCOUNTER — Ambulatory Visit (HOSPITAL_COMMUNITY): Payer: Medicare Other | Attending: Family Medicine | Admitting: Physical Therapy

## 2014-03-25 ENCOUNTER — Other Ambulatory Visit: Payer: Self-pay | Admitting: Internal Medicine

## 2014-03-25 DIAGNOSIS — G959 Disease of spinal cord, unspecified: Secondary | ICD-10-CM

## 2014-03-25 DIAGNOSIS — R29898 Other symptoms and signs involving the musculoskeletal system: Secondary | ICD-10-CM | POA: Diagnosis not present

## 2014-03-25 DIAGNOSIS — M25671 Stiffness of right ankle, not elsewhere classified: Secondary | ICD-10-CM | POA: Diagnosis not present

## 2014-03-25 DIAGNOSIS — R262 Difficulty in walking, not elsewhere classified: Secondary | ICD-10-CM | POA: Insufficient documentation

## 2014-03-25 DIAGNOSIS — M25561 Pain in right knee: Secondary | ICD-10-CM | POA: Diagnosis not present

## 2014-03-25 NOTE — Therapy (Signed)
[place], [place], [address] Phone: [phone]   Fax:  [phone]  Physical Therapy Treatment  Patient Details  Name: Amy Black MRN: [id number] Date of Birth: May 20, 1940 Referring Provider:  Dudley Major, DO  Encounter Date: 03/25/2014      PT End of Session - 03/25/14 1448    Visit Number 6   Number of Visits 16   Date for PT Re-Evaluation 03/27/14   Authorization Type [employer] medicare   Authorization - Visit Number 6   Authorization - Number of Visits 10   PT Start Time 1300   PT Stop Time 1355   PT Time Calculation (min) 55 min   Equipment Utilized During Treatment Gait belt;Left knee immobilizer   Activity Tolerance Patient tolerated treatment well   Behavior During Therapy [facility] for tasks assessed/performed      Past Medical History  Diagnosis Date  . CAD (coronary artery disease)   . Irritable bowel syndrome   . Diverticulosis of colon (without mention of hemorrhage)   . hepatic cyst   . Hyperlipidemia   . Adenosquamous carcinoma     left leg 2004 - radiation & resection  . HTN (hypertension)     PT. DENIES  . History of cardiac catheterization   . Unstable angina   . Adenocarcinoma     LEFT LEG  . Insomnia   . Vitamin D deficiency   . Adenocarcinoma of unknown primary 02/06/2011  . History of nuclear stress test 04/2011    lexiscan; normal study, no significant ischemia, low risk   . Coronary artery spasm     Past Surgical History  Procedure Laterality Date  . Resection soft tissue tumor leg / ankle radical  2004    leg lesion resection & radiation  . Diagnostic laparoscopy    . Lysis of adhesion    . Salpingoophorectomy Left   . Abdominal hysterectomy    . Pelvic laparoscopy  1992    RSO, AND LSO ON 2007  . Cardiac catheterization      coronary spasm  . Transthoracic echocardiogram  07/2012    LV cavity size mildly reduced, normal wall motion; MV with calcified annulus  and mild MR; LA mildly dilated; atrial septum with increased thickness - lipomatous hypertrophy; RV systolic pressure increased (borderline pulm HTN)    There were no vitals taken for this visit.  Visit Diagnosis:  Difficulty walking  Right leg weakness  Ankle stiffness, right  Pain in joint, lower leg, right  Myelopathy      Subjective Assessment - 03/25/14 1458    Symptoms Pt states she has missed therapy due to cataract surgery.  Pt has not been to appointment since 1/18.  Currently without pain.   Currently in Pain? No/denies          [facility] PT Assessment - 03/25/14 1310    Assessment   Medical Diagnosis Leg pain   Onset Date 01/25/13                  [facility] Adult PT Treatment/Exercise - 03/25/14 1309    Exercises   Exercises Lumbar   Lumbar Exercises: Stretches   Active Hamstring Stretch 3 reps;30 seconds   Active Hamstring Stretch Limitations standing 14" step   Passive Hamstring Stretch 3 reps;30 seconds   Passive Hamstring Stretch Limitations Gastroc Stretch, Slantboard   Quad Stretch 2 reps;30 seconds   Quad Stretch Limitations Prone with rope   Piriformis Stretch  2 reps;30 seconds   Piriformis Stretch Limitations seated, with plantar fascia stretch   Lumbar Exercises: Aerobic   Stationary Bike nusteps 10' level 4 hills #3 SPM 60-80   Lumbar Exercises: Seated   Sit to Stand 15 reps   Sit to Stand Limitations no UE, lowest mat   Other Seated Lumbar Exercises Rt heel slide x 10 for hamstrength and increased DF    Lumbar Exercises: Supine   Bridge 20 reps   Straight Leg Raise 10 reps   Straight Leg Raises Limitations floating    Lumbar Exercises: Sidelying   Clam 10 reps   Clam Limitations 5 second hold bilaterally   Hip Abduction 15 reps   Lumbar Exercises: Prone   Straight Leg Raise 15 reps   Other Prone Lumbar Exercises hamstring curls 2x10 reps   Other Prone Lumbar Exercises AA for Rt                   PT Short Term Goals -  01/22/14 1301    PT SHORT TERM GOAL #1   Title I in HEP   Time 1   Period Weeks   PT SHORT TERM GOAL #2   Title Pt pain to be no greater than a 5/10 80% of the time   Time 4   Period Weeks   PT SHORT TERM GOAL #3   Title Pt ankle dorsiflexion to improve to -5 to allow a more normalize gait   Time 4   Period Weeks   PT SHORT TERM GOAL #4   Title Pt mm to be improved by one grade    Time 4   Period Weeks           PT Long Term Goals - 01/22/14 1304    PT LONG TERM GOAL #3   Title Pt ankle ROM to be improved to 5 degrees to allow for a normalize gait    Time 6   Period Weeks   PT LONG TERM GOAL #4   Title Pt to be able to SLS x 5 seconds on Rt for decreased risk of falling   Time 6   Period Weeks   PT LONG TERM GOAL #5   Title Pt to feel comfortable walking in her home without an assistive device    Time 6   Period Weeks               Plan - 03/25/14 1450    Clinical Impression Statement Resumed therex adding nustep to work on actvitiy tolerance and LE muscular endurance.   Continued weakness with Lt LE, though bilateral LE's appear to be stronger despite lapse of therapy and completion of HEP.  Pt without c/o pain or reports of increased pain during session.     PT Next Visit Plan continue to progress strength of LE's and increase Rt DF ROM.  re-evaluate next visit.        Problem List Patient Active Problem List   Diagnosis Date Noted  . Chest pain 11/28/2013  . Baker's cyst of knee 02/14/2013  . Pain in joint, ankle and foot 02/14/2013  . Pedal edema 02/14/2013  . Rhinitis 02/14/2013  . Coronary artery spasm 11/19/2012  . Transverse myelitis 10/10/2012  . CAD (coronary artery disease) 08/29/2012  . HTN (hypertension) 08/29/2012  . HLD (hyperlipidemia) 08/29/2012  . Arthritis of neck 08/29/2012  . Conjunctivitis 05/28/2012  . Weakness of right leg 02/14/2012  . Flaccid paralysis of legs 02/10/2012  . Urinary  retention 02/10/2012  . Adenocarcinoma  of unknown primary 02/06/2011  . Gastritis 08/29/2010  . Duodenal nodule 08/29/2010  . Dysphagia 08/21/2010  . Chest pain 08/16/2010  . Irritable bowel disease 08/16/2010  . Anxiety 05/19/2010  . CAD 12/03/2009  . GERD 12/03/2009  . ADENOCARCINOMA 12/02/2009  . ANGINA, UNSTABLE 12/02/2009  . DIVERTICULOSIS, COLON 12/02/2009  . IRRITABLE BOWEL SYNDROME 12/02/2009  . HEPATIC CYST 12/02/2009  . DIARRHEA 12/02/2009    Teena Irani, PTA/CLT [phone] 03/25/2014, 2:59 PM  [place] [address] [place], [place], [address] Phone: [phone]   Fax:  [phone]

## 2014-03-26 ENCOUNTER — Telehealth: Payer: Self-pay | Admitting: Family Medicine

## 2014-03-26 NOTE — Telephone Encounter (Signed)
Rx(s) sent to pharmacy electronically.  

## 2014-03-27 ENCOUNTER — Encounter: Payer: Self-pay | Admitting: Family Medicine

## 2014-03-27 ENCOUNTER — Ambulatory Visit (INDEPENDENT_AMBULATORY_CARE_PROVIDER_SITE_OTHER): Payer: Medicare Other | Admitting: Family Medicine

## 2014-03-27 ENCOUNTER — Ambulatory Visit (HOSPITAL_COMMUNITY): Payer: Medicare Other | Admitting: Physical Therapy

## 2014-03-27 VITALS — BP 170/75 | HR 61 | Temp 97.0°F | Ht 63.0 in | Wt 160.4 lb

## 2014-03-27 DIAGNOSIS — M4692 Unspecified inflammatory spondylopathy, cervical region: Secondary | ICD-10-CM

## 2014-03-27 DIAGNOSIS — G373 Acute transverse myelitis in demyelinating disease of central nervous system: Secondary | ICD-10-CM

## 2014-03-27 DIAGNOSIS — I10 Essential (primary) hypertension: Secondary | ICD-10-CM

## 2014-03-27 DIAGNOSIS — J329 Chronic sinusitis, unspecified: Secondary | ICD-10-CM | POA: Diagnosis not present

## 2014-03-27 DIAGNOSIS — G0489 Other myelitis: Secondary | ICD-10-CM

## 2014-03-27 DIAGNOSIS — M47812 Spondylosis without myelopathy or radiculopathy, cervical region: Secondary | ICD-10-CM

## 2014-03-27 MED ORDER — BETAMETHASONE SOD PHOS & ACET 6 (3-3) MG/ML IJ SUSP
6.0000 mg | Freq: Once | INTRAMUSCULAR | Status: AC
  Administered 2014-03-27: 6 mg via INTRAMUSCULAR

## 2014-03-27 NOTE — Patient Instructions (Signed)
DASH Eating Plan °DASH stands for "Dietary Approaches to Stop Hypertension." The DASH eating plan is a healthy eating plan that has been shown to reduce high blood pressure (hypertension). Additional health benefits may include reducing the risk of type 2 diabetes mellitus, heart disease, and stroke. The DASH eating plan may also help with weight loss. °WHAT DO I NEED TO KNOW ABOUT THE DASH EATING PLAN? °For the DASH eating plan, you will follow these general guidelines: °· Choose foods with a percent daily value for sodium of less than 5% (as listed on the food label). °· Use salt-free seasonings or herbs instead of table salt or sea salt. °· Check with your health care provider or pharmacist before using salt substitutes. °· Eat lower-sodium products, often labeled as "lower sodium" or "no salt added." °· Eat fresh foods. °· Eat more vegetables, fruits, and low-fat dairy products. °· Choose whole grains. Look for the word "whole" as the first word in the ingredient list. °· Choose fish and skinless chicken or turkey more often than red meat. Limit fish, poultry, and meat to 6 oz (170 g) each day. °· Limit sweets, desserts, sugars, and sugary drinks. °· Choose heart-healthy fats. °· Limit cheese to 1 oz (28 g) per day. °· Eat more home-cooked food and less restaurant, buffet, and fast food. °· Limit fried foods. °· Cook foods using methods other than frying. °· Limit canned vegetables. If you do use them, rinse them well to decrease the sodium. °· When eating at a restaurant, ask that your food be prepared with less salt, or no salt if possible. °WHAT FOODS CAN I EAT? °Seek help from a dietitian for individual calorie needs. °Grains °Whole grain or whole wheat bread. Brown rice. Whole grain or whole wheat pasta. Quinoa, bulgur, and whole grain cereals. Low-sodium cereals. Corn or whole wheat flour tortillas. Whole grain cornbread. Whole grain crackers. Low-sodium crackers. °Vegetables °Fresh or frozen vegetables  (raw, steamed, roasted, or grilled). Low-sodium or reduced-sodium tomato and vegetable juices. Low-sodium or reduced-sodium tomato sauce and paste. Low-sodium or reduced-sodium canned vegetables.  °Fruits °All fresh, canned (in natural juice), or frozen fruits. °Meat and Other Protein Products °Ground beef (85% or leaner), grass-fed beef, or beef trimmed of fat. Skinless chicken or turkey. Ground chicken or turkey. Pork trimmed of fat. All fish and seafood. Eggs. Dried beans, peas, or lentils. Unsalted nuts and seeds. Unsalted canned beans. °Dairy °Low-fat dairy products, such as skim or 1% milk, 2% or reduced-fat cheeses, low-fat ricotta or cottage cheese, or plain low-fat yogurt. Low-sodium or reduced-sodium cheeses. °Fats and Oils °Tub margarines without trans fats. Light or reduced-fat mayonnaise and salad dressings (reduced sodium). Avocado. Safflower, olive, or canola oils. Natural peanut or almond butter. °Other °Unsalted popcorn and pretzels. °The items listed above may not be a complete list of recommended foods or beverages. Contact your dietitian for more options. °WHAT FOODS ARE NOT RECOMMENDED? °Grains °White bread. White pasta. White rice. Refined cornbread. Bagels and croissants. Crackers that contain trans fat. °Vegetables °Creamed or fried vegetables. Vegetables in a cheese sauce. Regular canned vegetables. Regular canned tomato sauce and paste. Regular tomato and vegetable juices. °Fruits °Dried fruits. Canned fruit in light or heavy syrup. Fruit juice. °Meat and Other Protein Products °Fatty cuts of meat. Ribs, chicken wings, bacon, sausage, bologna, salami, chitterlings, fatback, hot dogs, bratwurst, and packaged luncheon meats. Salted nuts and seeds. Canned beans with salt. °Dairy °Whole or 2% milk, cream, half-and-half, and cream cheese. Whole-fat or sweetened yogurt. Full-fat   cheeses or blue cheese. Nondairy creamers and whipped toppings. Processed cheese, cheese spreads, or cheese  curds. °Condiments °Onion and garlic salt, seasoned salt, table salt, and sea salt. Canned and packaged gravies. Worcestershire sauce. Tartar sauce. Barbecue sauce. Teriyaki sauce. Soy sauce, including reduced sodium. Steak sauce. Fish sauce. Oyster sauce. Cocktail sauce. Horseradish. Ketchup and mustard. Meat flavorings and tenderizers. Bouillon cubes. Hot sauce. Tabasco sauce. Marinades. Taco seasonings. Relishes. °Fats and Oils °Butter, stick margarine, lard, shortening, ghee, and bacon fat. Coconut, palm kernel, or palm oils. Regular salad dressings. °Other °Pickles and olives. Salted popcorn and pretzels. °The items listed above may not be a complete list of foods and beverages to avoid. Contact your dietitian for more information. °WHERE CAN I FIND MORE INFORMATION? °National Heart, Lung, and [employer] Institute: www.nhlbi.nih.gov/health/health-topics/topics/dash/ °Document Released: 01/12/2011 Document Revised: 06/09/2013 Document Reviewed: 11/27/2012 °ExitCare® Patient Information ©2015 [employer]. This information is not intended to replace advice given to you by your health care provider. Make sure you discuss any questions you have with your health care provider. ° °

## 2014-03-27 NOTE — Progress Notes (Signed)
Subjective:  Patient ID: Amy Black, female    DOB: [birth year]  Age: 74 y.o. MRN: [id number]  CC: Headache   HPI Amy Black presents for headache. Also neck pain. recent cataract surgery Jan. 26. Headache jumps around, side to side and front to back. Sharp.Marland Kitchen His between 4-6/10. Denies photophobia. No vision chage  Using pred drops in the rigth eye.occurs each evening from 5 pm. Takes naproxen with good relief. Results before bedtime. This is been going on for 3-4 weeks. Hx transverse myelitis.   Patient in for follow-up of hypertension. Patient has no history of  chest pain or shortness of breath or recent cough. Patient also denies symptoms of TIA . The transverse myelitis does cause some right sided numbness and  weakness lateralizing. Patient checks  blood pressure at home and has not had any elevated readings recently. Patient denies side effects from his medication. States taking it regularly.  History Amy Black has a past medical history of CAD (coronary artery disease); Irritable bowel syndrome; Diverticulosis of colon (without mention of hemorrhage); hepatic cyst; Hyperlipidemia; Adenosquamous carcinoma; HTN (hypertension); History of cardiac catheterization; Unstable angina; Adenocarcinoma; Insomnia; Vitamin D deficiency; Adenocarcinoma of unknown primary (02/06/2011); History of nuclear stress test (04/2011); and Coronary artery spasm.   She has past surgical history that includes Resection soft tissue tumor leg / ankle radical (2004); Diagnostic laparoscopy; Lysis of adhesion; Salpingoophorectomy (Left); Abdominal hysterectomy; Pelvic laparoscopy (1992); Cardiac catheterization; and transthoracic echocardiogram (07/2012).   Her family history includes Cancer in her mother; Coronary artery disease in an other family member; Diabetes in an other family member; Heart disease in her father and mother; Hypertension in her mother; Stroke in her father. There is no history of Colon  cancer.She reports that she quit smoking about 41 years ago. Her smoking use included Cigarettes. She started smoking about 45 years ago. She has a 2 pack-year smoking history. She has never used smokeless tobacco. She reports that she does not drink alcohol or use illicit drugs.  Current Outpatient Prescriptions on File Prior to Visit  Medication Sig Dispense Refill  . ALPRAZolam (XANAX) 0.5 MG tablet TAKE ONE TABLET BY MOUTH THREE TIMES DAILY 90 tablet 3  . aspirin 81 MG tablet Take 1 tablet (81 mg total) by mouth daily. 30 tablet   . cetirizine (ZYRTEC) 10 MG tablet Take 1 tablet (10 mg total) by mouth 2 (two) times daily. 30 tablet 0  . Cholecalciferol (VITAMIN D PO) Take by mouth. OTC    . cyclobenzaprine (FLEXERIL) 5 MG tablet Take 1 tablet (5 mg total) by mouth 3 (three) times daily as needed for muscle spasms. 30 tablet 1  . loperamide (IMODIUM) 2 MG capsule TAKE ONE CAPSULE BY MOUTH FOUR TIMES DAILY AS NEEDED FOR DIARRHEA / LOOSE STOOLS 120 capsule 2  . nitroGLYCERIN (NITROSTAT) 0.4 MG SL tablet Place 1 tablet (0.4 mg total) under the tongue every 5 (five) minutes as needed. For chest pain 25 tablet 3  . pantoprazole (PROTONIX) 40 MG tablet TAKE ONE TABLET BY MOUTH TWICE DAILY. 180 tablet 0  . traMADol (ULTRAM) 50 MG tablet Take 50 mg by mouth every 12 (twelve) hours as needed.   3  . triamterene-hydrochlorothiazide (MAXZIDE-25) 37.5-25 MG per tablet Take 1 tablet by mouth 3 (three) times a week.     . verapamil (CALAN) 80 MG tablet TAKE ONE TABLET BY MOUTH FOUR TIMES DAILY AFTER MEALS AND AT BEDTIME 360 tablet 2   No current facility-administered medications on file  prior to visit.    ROS Review of Systems  Constitutional: Negative for fever, chills, diaphoresis, activity change, appetite change and fatigue.  HENT: Positive for postnasal drip and sinus pressure. Negative for congestion, ear discharge, ear pain, hearing loss, nosebleeds, rhinorrhea, sneezing, sore throat and trouble  swallowing.   Respiratory: Negative for cough, chest tightness and shortness of breath.   Cardiovascular: Negative for chest pain and palpitations.  Gastrointestinal: Negative for abdominal pain.  Musculoskeletal: Negative for arthralgias.  Skin: Negative for rash.  Neurological: Negative for dizziness, syncope and numbness.    Objective:  BP 170/75 mmHg  Pulse 61  Temp(Src) 97 F (36.1 C) (Oral)  Ht 5\' 3"  (1.6 m)  Wt 160 lb 6.4 oz (72.757 kg)  BMI 28.42 kg/m2  BP Readings from Last 3 Encounters:  03/27/14 170/75  01/23/14 126/65  12/22/13 116/70    Wt Readings from Last 3 Encounters:  03/27/14 160 lb 6.4 oz (72.757 kg)  01/23/14 157 lb (71.215 kg)  01/19/14 160 lb (72.576 kg)     Physical Exam  Constitutional: She is oriented to person, place, and time. She appears well-developed and well-nourished. No distress.  HENT:  Head: Normocephalic and atraumatic.  Right Ear: External ear normal.  Left Ear: External ear normal.  Nose: Nose normal.  Mouth/Throat: Oropharynx is clear and moist.  Eyes: Conjunctivae and EOM are normal. Pupils are equal, round, and reactive to light.  Neck: Neck supple. No thyromegaly present.  Cardiovascular: Normal rate, regular rhythm and normal heart sounds.   No murmur heard. Pulmonary/Chest: Effort normal and breath sounds normal. No respiratory distress. She has no wheezes. She has no rales.  Abdominal: Soft. Bowel sounds are normal. She exhibits no distension. There is no tenderness.  Musculoskeletal: She exhibits tenderness (At posterior left cervical musculature).  Lymphadenopathy:    She has no cervical adenopathy.  Neurological: She is alert and oriented to person, place, and time. She has normal reflexes.  Skin: Skin is warm and dry.  Psychiatric: She has a normal mood and affect. Her behavior is normal. Judgment and thought content normal.    No results found for: HGBA1C  Lab Results  Component Value Date   WBC 6.1 01/23/2014    HGB 11.9 01/23/2014   HCT 37.0 01/23/2014   PLT 415* 01/23/2014   GLUCOSE 85 01/23/2014   TRIG 131 08/29/2012   LDLCALC 109* 08/29/2012   ALT <8 01/23/2014   AST 13 01/23/2014   NA 139 01/23/2014   K 4.2 01/23/2014   CL 101 01/23/2014   CREATININE 0.84 01/23/2014   BUN 8 01/23/2014   CO2 31 01/23/2014   TSH 0.911 09/10/2013   INR 1.14 02/14/2009    No results found.  Assessment & Plan:   Jahliyah was seen today for headache.  Diagnoses and all orders for this visit:  Arthritis of neck  Chronic sinusitis, unspecified location Orders: -     betamethasone acetate-betamethasone sodium phosphate (CELESTONE) injection 6 mg; Inject 1 mL (6 mg total) into the muscle once.  Essential hypertension  Transverse myelitis   I am having Ms. Boyte maintain her aspirin, nitroGLYCERIN, cetirizine, Cholecalciferol (VITAMIN D PO), pantoprazole, triamterene-hydrochlorothiazide, traMADol, cyclobenzaprine, loperamide, ALPRAZolam, verapamil, and VIGAMOX. We administered betamethasone acetate-betamethasone sodium phosphate.  Meds ordered this encounter  Medications  . VIGAMOX 0.5 % ophthalmic solution    Sig: Place 1 drop into the right eye 4 (four) times daily.    Refill:  0  . betamethasone acetate-betamethasone sodium phosphate (CELESTONE) injection  6 mg    Sig:     Follow-up: Return in about 1 week (around 04/03/2014).  [name], M.D.

## 2014-03-30 ENCOUNTER — Ambulatory Visit (HOSPITAL_COMMUNITY): Payer: Medicare Other | Admitting: Physical Therapy

## 2014-03-30 DIAGNOSIS — H35371 Puckering of macula, right eye: Secondary | ICD-10-CM | POA: Diagnosis not present

## 2014-04-01 ENCOUNTER — Ambulatory Visit (HOSPITAL_COMMUNITY): Payer: Medicare Other | Admitting: Physical Therapy

## 2014-04-03 NOTE — Telephone Encounter (Signed)
Patient had appointment with Dr. Livia Snellen 03-27-14.

## 2014-04-06 ENCOUNTER — Ambulatory Visit (HOSPITAL_COMMUNITY): Payer: Medicare Other | Admitting: Physical Therapy

## 2014-04-06 ENCOUNTER — Telehealth: Payer: Self-pay | Admitting: Family Medicine

## 2014-04-06 NOTE — Telephone Encounter (Signed)
Pt is having generalized weakness and wanted appt to see Dr Sabra Heck appt scheduled

## 2014-04-08 ENCOUNTER — Ambulatory Visit (HOSPITAL_COMMUNITY): Payer: Medicare Other | Attending: Family Medicine | Admitting: Physical Therapy

## 2014-04-08 DIAGNOSIS — R262 Difficulty in walking, not elsewhere classified: Secondary | ICD-10-CM | POA: Insufficient documentation

## 2014-04-08 DIAGNOSIS — M25561 Pain in right knee: Secondary | ICD-10-CM | POA: Diagnosis not present

## 2014-04-08 DIAGNOSIS — R29898 Other symptoms and signs involving the musculoskeletal system: Secondary | ICD-10-CM

## 2014-04-08 DIAGNOSIS — M25671 Stiffness of right ankle, not elsewhere classified: Secondary | ICD-10-CM | POA: Insufficient documentation

## 2014-04-08 DIAGNOSIS — G959 Disease of spinal cord, unspecified: Secondary | ICD-10-CM

## 2014-04-08 NOTE — Therapy (Signed)
[place], [place], [address] Phone: [phone]   Fax:  [phone]  Physical Therapy Evaluation  Patient Details  Name: Amy Black MRN: [id number] Date of Birth: 1940/12/13 Referring Provider:  Dudley Major, DO  Encounter Date: 04/08/2014      PT End of Session - 04/08/14 1352    Visit Number 7   Number of Visits 16   Date for PT Re-Evaluation 05/20/14   Authorization Type [employer] medicare   Authorization - Visit Number 7   Authorization - Number of Visits 10   Activity Tolerance Patient tolerated treatment well   Behavior During Therapy [facility] for tasks assessed/performed      Past Medical History  Diagnosis Date  . CAD (coronary artery disease)   . Irritable bowel syndrome   . Diverticulosis of colon (without mention of hemorrhage)   . hepatic cyst   . Hyperlipidemia   . Adenosquamous carcinoma     left leg 2004 - radiation & resection  . HTN (hypertension)     PT. DENIES  . History of cardiac catheterization   . Unstable angina   . Adenocarcinoma     LEFT LEG  . Insomnia   . Vitamin D deficiency   . Adenocarcinoma of unknown primary 02/06/2011  . History of nuclear stress test 04/2011    lexiscan; normal study, no significant ischemia, low risk   . Coronary artery spasm     Past Surgical History  Procedure Laterality Date  . Resection soft tissue tumor leg / ankle radical  2004    leg lesion resection & radiation  . Diagnostic laparoscopy    . Lysis of adhesion    . Salpingoophorectomy Left   . Abdominal hysterectomy    . Pelvic laparoscopy  1992    RSO, AND LSO ON 2007  . Cardiac catheterization      coronary spasm  . Transthoracic echocardiogram  07/2012    LV cavity size mildly reduced, normal wall motion; MV with calcified annulus and mild MR; LA mildly dilated; atrial septum with increased thickness - lipomatous hypertrophy; RV systolic pressure increased (borderline pulm HTN)     There were no vitals taken for this visit.  Visit Diagnosis:  Difficulty walking  Right leg weakness  Ankle stiffness, right  Pain in joint, lower leg, right  Myelopathy      Subjective Assessment - 04/08/14 1345    Symptoms PT states she has so many appointments and has had health issues and that is why she has been unable to keep her appointments.  PT reports her pain varies 5-8/10, especially high at night and has sudden onset into shoulders and down into back, kind of like a nerve pain.    Currently in Pain? Yes   Pain Score 5    Pain Location Shoulder          [facility] PT Assessment - 04/08/14 1311    Assessment   Medical Diagnosis Leg pain   Onset Date 01/25/13   Precautions   Precautions None   AROM   Right Ankle Dorsiflexion 0  was -12   Strength   Right Hip Flexion 4+/5  was 3+/5   Right Hip Extension 3+/5  was 1/5   Right Hip ABduction 4-/5  was 3-/5   Right Knee Flexion 3-/5  was 1/5   Right Knee Extension 5/5  was 5/5   Right Ankle Dorsiflexion 4/5  was 3-/5   Flexibility  Quadriceps Rt 8.3 inches heel to glute(was 7.5), Lt 4.3 inches heel to glute (was 4 inches)   Balance   Balance Assessed Yes  2 seconds max Rt LE without UE support (was unable)                  [facility] Adult PT Treatment/Exercise - 04/08/14 1306    Exercises   Exercises Lumbar   Lumbar Exercises: Stretches   Active Hamstring Stretch 3 reps;30 seconds   Active Hamstring Stretch Limitations standing 14" step   Passive Hamstring Stretch 3 reps;30 seconds   Passive Hamstring Stretch Limitations Gastroc Stretch, Slantboard   Lumbar Exercises: Aerobic   Stationary Bike nustep 10' level 4 hills #3 SPM 60-80                PT Education - 04/08/14 1347    Education provided Yes   Education Details Continue established HEP, contact therapy offices when unable to make scheduled appointment or when has a conflict so we can reschedule it.    Person(s) Educated  Patient   Methods Explanation   Comprehension Verbalized understanding          PT Short Term Goals - 04/08/14 1340    PT SHORT TERM GOAL #1   Title I in HEP   Time 1   Period Weeks   Status Achieved   PT SHORT TERM GOAL #2   Title Pt pain to be no greater than a 5/10 80% of the time   Time 4   Period Weeks   Status On-going   PT SHORT TERM GOAL #3   Title Pt ankle dorsiflexion to improve to -5 to allow a more normalize gait   Time 4   Period Weeks   Status Achieved   PT SHORT TERM GOAL #4   Title Pt mm to be improved by one grade    Time 4   Period Weeks   Status Achieved           PT Long Term Goals - 04/08/14 1342    PT LONG TERM GOAL #1   Title Pt to be I in advance HEP   Time 6   Period Weeks   Status On-going   PT LONG TERM GOAL #2   Title Pain level to be no greater than a 3/10 80% of the time   Time 6   Period Weeks   Status On-going   PT LONG TERM GOAL #3   Title Pt ankle ROM to be improved to 5 degrees to allow for a normalize gait    Time 6   Period Weeks   Status On-going   PT LONG TERM GOAL #4   Title Pt to be able to SLS x 5 seconds on Rt for decreased risk of falling   Time 6   Period Weeks   Status On-going   PT LONG TERM GOAL #5   Title Pt to feel comfortable walking in her home without an assistive device    Time 6   Period Weeks               Plan - 04/08/14 1348    Clinical Impression Statement Pt with sporatic attendance at therapy, completing only 2 visits since 1/18.  Pt has made progress despite this lapse in therapy with improvments in LE strength and ROM.  PT reported she continues to have pain, especially at night and describes as a nerve pain that extends from shoulders into lower  back.  Pt has met 3/4 STG's and 0/4 LTG's. Pt will benefit from continued therapy to reach remaining goals and improve overall function.     Pt will benefit from skilled therapeutic intervention in order to improve on the following deficits  Decreased balance;Abnormal gait;Decreased range of motion;Decreased strength;Difficulty walking;Pain   PT Frequency 3x / week   PT Duration 6 weeks   PT Treatment/Interventions Patient/family education;Therapeutic exercise;Therapeutic activities;Balance training   PT Next Visit Plan continue to progress strength of LE's and increase Rt DF ROM.  Begin balance actvities and progress to advanced HEP when able.    Consulted and Agree with Plan of Care Patient         Problem List Patient Active Problem List   Diagnosis Date Noted  . Baker's cyst of knee 02/14/2013  . Coronary artery spasm 11/19/2012  . Transverse myelitis 10/10/2012  . HTN (hypertension) 08/29/2012  . Arthritis of neck 08/29/2012  . Adenocarcinoma of unknown primary 02/06/2011  . Chest pain 08/16/2010  . Anxiety 05/19/2010  . CAD 12/03/2009  . GERD 12/03/2009  . ANGINA, UNSTABLE 12/02/2009    Teena Irani, PTA/CLT [phone] 04/08/2014, 1:53 PM [place], [place], [address] Phone: [phone]   Fax:  [phone]

## 2014-04-16 ENCOUNTER — Other Ambulatory Visit (INDEPENDENT_AMBULATORY_CARE_PROVIDER_SITE_OTHER): Payer: Medicare Other

## 2014-04-16 DIAGNOSIS — R531 Weakness: Secondary | ICD-10-CM

## 2014-04-16 LAB — POCT CBC
Granulocyte percent: 53.3 %G (ref 37–80)
HCT, POC: 41.2 % (ref 37.7–47.9)
Hemoglobin: 12.3 g/dL (ref 12.2–16.2)
Lymph, poc: 2.2 (ref 0.6–3.4)
MCH, POC: 28.1 pg (ref 27–31.2)
MCHC: 29.9 g/dL — AB (ref 31.8–35.4)
MCV: 93.9 fL (ref 80–97)
MPV: 7.2 fL (ref 0–99.8)
POC Granulocyte: 3 (ref 2–6.9)
POC LYMPH PERCENT: 38.8 %L (ref 10–50)
Platelet Count, POC: 433 10*3/uL — AB (ref 142–424)
RBC: 4.39 M/uL (ref 4.04–5.48)
RDW, POC: 14.2 %
WBC: 5.7 10*3/uL (ref 4.6–10.2)

## 2014-04-16 NOTE — Progress Notes (Signed)
Lab only 

## 2014-04-16 NOTE — Progress Notes (Addendum)
Patient comes in today requesting lab results for her upcoming appointment with Dr Sabra Heck. There are no labs due for his appointment.  Upon reviewing her chart it appears that Dr Marin Olp checked her CBC and CMP in 01/2014. There are also future orders for her f/u appt with him in June 2016. After further discussion she complaints of intermittent fatigue for the past few weeks. Her appointment with Dr Sabra Heck is to address this issue.  Patient prefers not to schedule with the provider that has an available appointment today. She is unavailable to return tomorrow afternoon for available appointment.  Discussed with Evelina Dun, FNP. CBC ordered. CBC reviewed and was normal except for slightly elevated platelet count which is consistent with past readings. Patient advised to keep appointment with Dr Sabra Heck and to call Dr Antonieta Pert office as well to notify them of her symptoms. They may have follow up recommendations.

## 2014-04-17 ENCOUNTER — Ambulatory Visit (HOSPITAL_COMMUNITY): Payer: Medicare Other | Admitting: Physical Therapy

## 2014-04-17 DIAGNOSIS — R262 Difficulty in walking, not elsewhere classified: Secondary | ICD-10-CM | POA: Diagnosis not present

## 2014-04-17 DIAGNOSIS — R29898 Other symptoms and signs involving the musculoskeletal system: Secondary | ICD-10-CM | POA: Diagnosis not present

## 2014-04-17 DIAGNOSIS — M25561 Pain in right knee: Secondary | ICD-10-CM | POA: Diagnosis not present

## 2014-04-17 DIAGNOSIS — M25671 Stiffness of right ankle, not elsewhere classified: Secondary | ICD-10-CM

## 2014-04-17 DIAGNOSIS — G959 Disease of spinal cord, unspecified: Secondary | ICD-10-CM

## 2014-04-17 NOTE — Therapy (Signed)
[place], [place], [address] Phone: [phone]   Fax:  [phone]  Physical Therapy Treatment  Patient Details  Name: Amy Black MRN: [id number] Date of Birth: [birth year] Referring Provider:  Pieter Partridge, DO  Encounter Date: 04/17/2014      PT End of Session - 04/17/14 1512    Visit Number 8   Number of Visits 16   Date for PT Re-Evaluation 05/20/14   Authorization Type [employer] medicare   Authorization - Visit Number 8   PT Start Time 2703   PT Stop Time 1511   PT Time Calculation (min) 48 min   Activity Tolerance Patient tolerated treatment well   Behavior During Therapy [facility] for tasks assessed/performed      Past Medical History  Diagnosis Date  . CAD (coronary artery disease)   . Irritable bowel syndrome   . Diverticulosis of colon (without mention of hemorrhage)   . hepatic cyst   . Hyperlipidemia   . Adenosquamous carcinoma     left leg 2004 - radiation & resection  . HTN (hypertension)     PT. DENIES  . History of cardiac catheterization   . Unstable angina   . Adenocarcinoma     LEFT LEG  . Insomnia   . Vitamin D deficiency   . Adenocarcinoma of unknown primary 02/06/2011  . History of nuclear stress test 04/2011    lexiscan; normal study, no significant ischemia, low risk   . Coronary artery spasm     Past Surgical History  Procedure Laterality Date  . Resection soft tissue tumor leg / ankle radical  2004    leg lesion resection & radiation  . Diagnostic laparoscopy    . Lysis of adhesion    . Salpingoophorectomy Left   . Abdominal hysterectomy    . Pelvic laparoscopy  1992    RSO, AND LSO ON 2007  . Cardiac catheterization      coronary spasm  . Transthoracic echocardiogram  07/2012    LV cavity size mildly reduced, normal wall motion; MV with calcified annulus and mild MR; LA mildly dilated; atrial septum with increased thickness - lipomatous hypertrophy; RV systolic  pressure increased (borderline pulm HTN)    There were no vitals filed for this visit.  Visit Diagnosis:  Difficulty walking  Ankle stiffness, right  Right leg weakness  Pain in joint, lower leg, right  Myelopathy      Subjective Assessment - 04/17/14 1427    Symptoms Patient states that she is doing well today; she wanted to remind therapist that she does have unstable angina but that her doctor is aware. No pain today.    Currently in Pain? No/denies                       [facility] Adult PT Treatment/Exercise - 04/17/14 0001    Lumbar Exercises: Stretches   Active Hamstring Stretch 3 reps;30 seconds   Active Hamstring Stretch Limitations 14 inch step   Passive Hamstring Stretch 3 reps;30 seconds   Passive Hamstring Stretch Limitations Gastroc on slantboard   Quad Stretch 2 reps;30 seconds   Quad Stretch Limitations prone with rope   Piriformis Stretch 2 reps;30 seconds   Piriformis Stretch Limitations seated   Lumbar Exercises: Standing   Heel Raises 15 reps   Heel Raises Limitations slantboard   Functional Squats 10 reps   Functional Squats Limitations sit to stand with no hands  Forward Lunge 10 reps   Forward Lunge Limitations 4 inch box   Other Standing Lumbar Exercises 3D hip excursions 1x10 in split stance; standing hip ABD and extension with 2# weights 1x10  each leg   Other Standing Lumbar Exercises Hip IR box walk arounds 1x6 each side             Balance Exercises - 04/17/14 1506    Balance Exercises: Standing   Standing Eyes Closed Narrow base of support (BOS);Foam/compliant surface;3 reps;20 secs   Tandem Stance Eyes open;Foam/compliant surface;3 reps;20 secs   SLS Eyes open;Solid surface;3 reps;20 secs           PT Education - 04/17/14 1511    Education provided Yes   Education Details Education regarding need for optimal flexibility, strength, and balance work to address deficits   Person(s) Educated Patient   Methods  Explanation   Comprehension Verbalized understanding          PT Short Term Goals - 04/08/14 1340    PT SHORT TERM GOAL #1   Title I in HEP   Time 1   Period Weeks   Status Achieved   PT SHORT TERM GOAL #2   Title Pt pain to be no greater than a 5/10 80% of the time   Time 4   Period Weeks   Status On-going   PT SHORT TERM GOAL #3   Title Pt ankle dorsiflexion to improve to -5 to allow a more normalize gait   Time 4   Period Weeks   Status Achieved   PT SHORT TERM GOAL #4   Title Pt mm to be improved by one grade    Time 4   Period Weeks   Status Achieved           PT Long Term Goals - 04/08/14 1342    PT LONG TERM GOAL #1   Title Pt to be I in advance HEP   Time 6   Period Weeks   Status On-going   PT LONG TERM GOAL #2   Title Pain level to be no greater than a 3/10 80% of the time   Time 6   Period Weeks   Status On-going   PT LONG TERM GOAL #3   Title Pt ankle ROM to be improved to 5 degrees to allow for a normalize gait    Time 6   Period Weeks   Status On-going   PT LONG TERM GOAL #4   Title Pt to be able to SLS x 5 seconds on Rt for decreased risk of falling   Time 6   Period Weeks   Status On-going   PT LONG TERM GOAL #5   Title Pt to feel comfortable walking in her home without an assistive device    Time 6   Period Weeks               Plan - 04/17/14 1512    Clinical Impression Statement Patient presented early to therapy today, very pleasant and motivated to participate; front desk had provided patient with fresh schedule. Patient continues to demonstrate bilateral proximal hip weakness as well as reduced balance/balance reaction strategies and reduced activity tolerance. Patient responded well to today's exercise program, which addressed strength, flexibility, and balance tasks.  Significant difficulty with SLS both legs, likely due to weakness.    Pt will benefit from skilled therapeutic intervention in order to improve on the  following deficits Decreased balance;Abnormal gait;Decreased range  of motion;Decreased strength;Difficulty walking;Pain   PT Frequency 3x / week   PT Duration 6 weeks   PT Treatment/Interventions Patient/family education;Therapeutic exercise;Therapeutic activities;Balance training   PT Next Visit Plan continue to progress strength of LE's and increase Rt DF ROM.  Begin balance actvities and progress to advanced HEP when able.    Consulted and Agree with Plan of Care Patient        Problem List Patient Active Problem List   Diagnosis Date Noted  . Baker's cyst of knee 02/14/2013  . Coronary artery spasm 11/19/2012  . Transverse myelitis 10/10/2012  . HTN (hypertension) 08/29/2012  . Arthritis of neck 08/29/2012  . Adenocarcinoma of unknown primary 02/06/2011  . Chest pain 08/16/2010  . Anxiety 05/19/2010  . CAD 12/03/2009  . GERD 12/03/2009  . ANGINA, UNSTABLE 12/02/2009    Deniece Ree PT, DPT [facility] [address] [place], [place], [address] Phone: [phone]   Fax:  [phone]

## 2014-04-20 ENCOUNTER — Ambulatory Visit (HOSPITAL_COMMUNITY): Payer: Medicare Other | Admitting: Physical Therapy

## 2014-04-22 ENCOUNTER — Ambulatory Visit (HOSPITAL_COMMUNITY): Payer: Medicare Other

## 2014-04-22 DIAGNOSIS — R262 Difficulty in walking, not elsewhere classified: Secondary | ICD-10-CM

## 2014-04-22 DIAGNOSIS — R29898 Other symptoms and signs involving the musculoskeletal system: Secondary | ICD-10-CM

## 2014-04-22 DIAGNOSIS — M25671 Stiffness of right ankle, not elsewhere classified: Secondary | ICD-10-CM | POA: Diagnosis not present

## 2014-04-22 DIAGNOSIS — M25561 Pain in right knee: Secondary | ICD-10-CM

## 2014-04-22 NOTE — Therapy (Signed)
[place], [place], [address] Phone: [phone]   Fax:  [phone]  Physical Therapy Treatment  Patient Details  Name: Amy Black MRN: [id number] Date of Birth: [birth year] Referring Provider:  Pieter Partridge, DO  Encounter Date: 04/22/2014      PT End of Session - 04/22/14 1521    Visit Number 9   Number of Visits 16   Date for PT Re-Evaluation 05/20/14   Authorization Type [employer] medicare   Authorization - Visit Number 9   Authorization - Number of Visits 10   PT Start Time 1430   PT Stop Time 1525   PT Time Calculation (min) 55 min   Equipment Utilized During Treatment Gait belt   Activity Tolerance Patient tolerated treatment well   Behavior During Therapy [facility] for tasks assessed/performed      Past Medical History  Diagnosis Date  . CAD (coronary artery disease)   . Irritable bowel syndrome   . Diverticulosis of colon (without mention of hemorrhage)   . hepatic cyst   . Hyperlipidemia   . Adenosquamous carcinoma     left leg 2004 - radiation & resection  . HTN (hypertension)     PT. DENIES  . History of cardiac catheterization   . Unstable angina   . Adenocarcinoma     LEFT LEG  . Insomnia   . Vitamin D deficiency   . Adenocarcinoma of unknown primary 02/06/2011  . History of nuclear stress test 04/2011    lexiscan; normal study, no significant ischemia, low risk   . Coronary artery spasm     Past Surgical History  Procedure Laterality Date  . Resection soft tissue tumor leg / ankle radical  2004    leg lesion resection & radiation  . Diagnostic laparoscopy    . Lysis of adhesion    . Salpingoophorectomy Left   . Abdominal hysterectomy    . Pelvic laparoscopy  1992    RSO, AND LSO ON 2007  . Cardiac catheterization      coronary spasm  . Transthoracic echocardiogram  07/2012    LV cavity size mildly reduced, normal wall motion; MV with calcified annulus and mild MR; LA mildly  dilated; atrial septum with increased thickness - lipomatous hypertrophy; RV systolic pressure increased (borderline pulm HTN)    There were no vitals filed for this visit.  Visit Diagnosis:  Difficulty walking  Ankle stiffness, right  Right leg weakness  Pain in joint, lower leg, right      Subjective Assessment - 04/22/14 1429    Symptoms Pt reported compliance with HEP every other day.  No current pain just stiffness in calf and thigh Bil LE .   Currently in Pain? No/denies            [facility] PT Assessment - 04/22/14 0001    Assessment   Medical Diagnosis Leg pain   Onset Date 01/25/13   Next MD Visit Tomi Likens none scheduled   Prior Therapy [facility] no better   Precautions   Precautions None                   [facility] Adult PT Treatment/Exercise - 04/22/14 0001    Exercises   Exercises Knee/Hip   Lumbar Exercises: Stretches   Active Hamstring Stretch 3 reps;30 seconds   Active Hamstring Stretch Limitations 14 inch step   Passive Hamstring Stretch 3 reps;30 seconds   Passive Hamstring Stretch Limitations Gastroc on  slantboard   Piriformis Stretch 2 reps;30 seconds   Piriformis Stretch Limitations seated   Lumbar Exercises: Standing   Heel Raises 20 reps;5 seconds   Heel Raises Limitations slantboard   Forward Lunge 10 reps   Forward Lunge Limitations 4 inch box   Side Lunge 10 reps   Side Lunge Limitations 4in step   Other Standing Lumbar Exercises 3D anlke excursion infront of ankle for ROM; 3D hip excursion in split stance Rt behind for increased weight bearing             Balance Exercises - 04/22/14 1454    Balance Exercises: Standing   Tandem Stance Eyes open;3 reps;25 secs   SLS Eyes open;Solid surface;3 reps;30 secs  1 finger HHA   Balance Beam 3RT forward and retro    Tandem Gait Forward;Retro;1 rep   Sidestepping 1 rep;Theraband             PT Short Term Goals - 04/22/14 1506    PT SHORT TERM GOAL #1   Title I in HEP    Status Achieved   PT SHORT TERM GOAL #2   Title Pt pain to be no greater than a 5/10 80% of the time   Status On-going   PT SHORT TERM GOAL #3   Title Pt ankle dorsiflexion to improve to -5 to allow a more normalize gait   Status Achieved   PT SHORT TERM GOAL #4   Title Pt mm to be improved by one grade    Status Achieved           PT Long Term Goals - 04/22/14 1506    PT LONG TERM GOAL #1   Title Pt to be I in advance HEP   Status On-going   PT LONG TERM GOAL #2   Title Pain level to be no greater than a 3/10 80% of the time   PT LONG TERM GOAL #3   Title Pt ankle ROM to be improved to 5 degrees to allow for a normalize gait    PT LONG TERM GOAL #4   Title Pt to be able to SLS x 5 seconds on Rt for decreased risk of falling   PT LONG TERM GOAL #5   Title Pt to feel comfortable walking in her home without an assistive device                Plan - 04/22/14 1523    Clinical Impression Statement Added 3D ankle excursion exercises to improve ankle ROM with therapist facilitation for improved technique.  Progressed balance activities this session, pt unable to complete SLS without HHA.  Added sidestepping with resistance for gluteal medius strengtheing with improve confridence with SLS balance activiteis .  Progressed to balance beam tandem and retro gait activities with min assistance required.  No reports of pain through session.   PT Next Visit Plan Gcode due next session.  continue to progress strength of LE's and increase Rt DF ROM.  Continue balance actvities and progress to advanced HEP when able.         Problem List Patient Active Problem List   Diagnosis Date Noted  . Baker's cyst of knee 02/14/2013  . Coronary artery spasm 11/19/2012  . Transverse myelitis 10/10/2012  . HTN (hypertension) 08/29/2012  . Arthritis of neck 08/29/2012  . Adenocarcinoma of unknown primary 02/06/2011  . Chest pain 08/16/2010  . Anxiety 05/19/2010  . CAD 12/03/2009  . GERD  12/03/2009  . ANGINA, UNSTABLE  12/02/2009   Ihor Austin, [place]  Aldona Lento 04/22/2014, 3:40 PM  [facility] [address] [place], [place], [address] Phone: [phone]   Fax:  [phone]

## 2014-04-24 ENCOUNTER — Encounter (HOSPITAL_COMMUNITY): Payer: Medicare Other | Admitting: Physical Therapy

## 2014-04-24 DIAGNOSIS — H1013 Acute atopic conjunctivitis, bilateral: Secondary | ICD-10-CM | POA: Diagnosis not present

## 2014-04-27 ENCOUNTER — Ambulatory Visit (HOSPITAL_COMMUNITY): Payer: Medicare Other | Admitting: Physical Therapy

## 2014-04-27 DIAGNOSIS — R262 Difficulty in walking, not elsewhere classified: Secondary | ICD-10-CM

## 2014-04-27 DIAGNOSIS — M25671 Stiffness of right ankle, not elsewhere classified: Secondary | ICD-10-CM | POA: Diagnosis not present

## 2014-04-27 DIAGNOSIS — G959 Disease of spinal cord, unspecified: Secondary | ICD-10-CM

## 2014-04-27 DIAGNOSIS — R29898 Other symptoms and signs involving the musculoskeletal system: Secondary | ICD-10-CM

## 2014-04-27 DIAGNOSIS — M25561 Pain in right knee: Secondary | ICD-10-CM | POA: Diagnosis not present

## 2014-04-27 NOTE — Therapy (Signed)
[place], [place], [address] Phone: [phone]   Fax:  [phone]  Physical Therapy Treatment  Patient Details  Name: Amy Black MRN: [id number] Date of Birth: [birth year] Referring Provider:  Pieter Partridge, DO  Encounter Date: 04/27/2014      PT End of Session - 04/27/14 1357    Visit Number 10   Number of Visits 16   Date for PT Re-Evaluation 05/20/14   Authorization Type [employer] medicare   Authorization Time Period G-codes done 10th visit   Authorization - Visit Number 10   Authorization - Number of Visits 10   Activity Tolerance Patient tolerated treatment well   Behavior During Therapy [facility] for tasks assessed/performed      Past Medical History  Diagnosis Date  . CAD (coronary artery disease)   . Irritable bowel syndrome   . Diverticulosis of colon (without mention of hemorrhage)   . hepatic cyst   . Hyperlipidemia   . Adenosquamous carcinoma     left leg 2004 - radiation & resection  . HTN (hypertension)     PT. DENIES  . History of cardiac catheterization   . Unstable angina   . Adenocarcinoma     LEFT LEG  . Insomnia   . Vitamin D deficiency   . Adenocarcinoma of unknown primary 02/06/2011  . History of nuclear stress test 04/2011    lexiscan; normal study, no significant ischemia, low risk   . Coronary artery spasm     Past Surgical History  Procedure Laterality Date  . Resection soft tissue tumor leg / ankle radical  2004    leg lesion resection & radiation  . Diagnostic laparoscopy    . Lysis of adhesion    . Salpingoophorectomy Left   . Abdominal hysterectomy    . Pelvic laparoscopy  1992    RSO, AND LSO ON 2007  . Cardiac catheterization      coronary spasm  . Transthoracic echocardiogram  07/2012    LV cavity size mildly reduced, normal wall motion; MV with calcified annulus and mild MR; LA mildly dilated; atrial septum with increased thickness - lipomatous hypertrophy; RV  systolic pressure increased (borderline pulm HTN)    There were no vitals filed for this visit.  Visit Diagnosis:  Difficulty walking  Right leg weakness  Ankle stiffness, right  Pain in joint, lower leg, right  Myelopathy      Subjective Assessment - 04/27/14 1309    Symptoms Patient doing very well today, no pain today   Currently in Pain? No/denies                       [facility] Adult PT Treatment/Exercise - 04/27/14 0001    Lumbar Exercises: Stretches   Active Hamstring Stretch 3 reps;30 seconds   Active Hamstring Stretch Limitations 14 inch step   Passive Hamstring Stretch 3 reps;30 seconds   Passive Hamstring Stretch Limitations Gastroc, 3-way   Quad Stretch 2 reps;30 seconds   Quad Stretch Limitations prone   Piriformis Stretch 2 reps;30 seconds   Piriformis Stretch Limitations seated   Lumbar Exercises: Standing   Heel Raises 15 reps   Heel Raises Limitations foam with finger tip touch   Forward Lunge 15 reps   Forward Lunge Limitations 4 inch box   Lumbar Exercises: Seated   Other Seated Lumbar Exercises 3D ankle excursions 1x10 and ankle circles x10; 3D hip excursions in split stance 1x10  Knee/Hip Exercises: Standing   Rocker Board Limitations Attempted task with patient leaning forward at the ankles in order to further challenge R ankle DF; however patient had difficulty with task. Switched to basic rockerboard use with both and R foot only. Assist from therapist when using rockerboard with R foot only to increase R ankle DF.    Other Standing Knee Exercises Navigation of 4 inch hurdles with close guard, significant difficulty in properly clearning hurdles and reducing circumduction; strong impairments noted with posture and balance during task.              Balance Exercises - 04/27/14 1357    Balance Exercises: Standing   Standing Eyes Closed Narrow base of support (BOS);Foam/compliant surface;4 reps;20 secs   Tandem Stance Eyes  open;Foam/compliant surface;3 reps;15 secs           PT Education - 04/27/14 1352    Education provided Yes   Education Details Education provided regarding role of adequte ankle dorsiflexion in balance and efficient gait   Person(s) Educated Patient   Methods Explanation   Comprehension Verbalized understanding          PT Short Term Goals - 04/22/14 1506    PT SHORT TERM GOAL #1   Title I in HEP   Status Achieved   PT SHORT TERM GOAL #2   Title Pt pain to be no greater than a 5/10 80% of the time   Status On-going   PT SHORT TERM GOAL #3   Title Pt ankle dorsiflexion to improve to -5 to allow a more normalize gait   Status Achieved   PT SHORT TERM GOAL #4   Title Pt mm to be improved by one grade    Status Achieved           PT Long Term Goals - 04/22/14 1506    PT LONG TERM GOAL #1   Title Pt to be I in advance HEP   Status On-going   PT LONG TERM GOAL #2   Title Pain level to be no greater than a 3/10 80% of the time   PT LONG TERM GOAL #3   Title Pt ankle ROM to be improved to 5 degrees to allow for a normalize gait    PT LONG TERM GOAL #4   Title Pt to be able to SLS x 5 seconds on Rt for decreased risk of falling   PT LONG TERM GOAL #5   Title Pt to feel comfortable walking in her home without an assistive device                Plan - 04/27/14 1358    Clinical Impression Statement Patient continues to demonstrate significant bilateral quad tightness (most tight on the right), as well as reduced range of motion in R ankle; attempted mutliple techniques on rockerboard in order to improve R ankle DF. also introduced hurdles in order to challenge balance in combination with R ankle dorsiflexion; patient had significant difficulty with task.   Pt will benefit from skilled therapeutic intervention in order to improve on the following deficits Decreased balance;Abnormal gait;Decreased range of motion;Decreased strength;Difficulty walking;Pain   PT  Frequency 3x / week   PT Duration 6 weeks   PT Treatment/Interventions Patient/family education;Therapeutic exercise;Therapeutic activities;Balance training   PT Next Visit Plan continue to progress strength of LE's and increase Rt DF ROM.  Continue balance actvities and progress to advanced HEP when able. Give handout for quad stretch at home.  PT Home Exercise Plan given   Consulted and Agree with Plan of Care Patient          G-Codes - 05/23/2014 1355    Functional Assessment Tool Used clinical judgement, skilled clinical assessment   Functional Limitation Mobility: Walking and moving around   Mobility: Walking and Moving Around Current Status [phone]) At least 40 percent but less than 60 percent impaired, limited or restricted   Mobility: Walking and Moving Around Goal Status [phone]) At least 20 percent but less than 40 percent impaired, limited or restricted      Problem List Patient Active Problem List   Diagnosis Date Noted  . Baker's cyst of knee 02/14/2013  . Coronary artery spasm 11/19/2012  . Transverse myelitis 10/10/2012  . HTN (hypertension) 08/29/2012  . Arthritis of neck 08/29/2012  . Adenocarcinoma of unknown primary 02/06/2011  . Chest pain 08/16/2010  . Anxiety 05/19/2010  . CAD 12/03/2009  . GERD 12/03/2009  . ANGINA, UNSTABLE 12/02/2009   Deniece Ree PT, DPT [place] [address] [place], [place], [address] Phone: [phone]   Fax:  [phone]

## 2014-04-29 ENCOUNTER — Encounter: Payer: Self-pay | Admitting: Family Medicine

## 2014-04-29 ENCOUNTER — Ambulatory Visit (HOSPITAL_COMMUNITY): Payer: Medicare Other | Admitting: Physical Therapy

## 2014-04-29 ENCOUNTER — Ambulatory Visit (INDEPENDENT_AMBULATORY_CARE_PROVIDER_SITE_OTHER): Payer: Medicare Other | Admitting: Family Medicine

## 2014-04-29 VITALS — BP 131/76 | HR 56 | Temp 97.2°F | Ht 63.0 in | Wt 160.0 lb

## 2014-04-29 DIAGNOSIS — J323 Chronic sphenoidal sinusitis: Secondary | ICD-10-CM | POA: Diagnosis not present

## 2014-04-29 MED ORDER — AMOXICILLIN-POT CLAVULANATE 875-125 MG PO TABS: 1.0000 | ORAL_TABLET | Freq: Two times a day (BID) | ORAL | Status: DC

## 2014-04-29 NOTE — Progress Notes (Signed)
   Subjective:    Patient ID: Amy Black, female    DOB: 08-11-1940, 74 y.o.   MRN: [id number]  HPI 75 year old female with chief complaint of weakness in the back of her head. She has had some sinus congestion but denies any real headache symptoms have been present about one month.  Patient Active Problem List   Diagnosis Date Noted  . Baker's cyst of knee 02/14/2013  . Coronary artery spasm 11/19/2012  . Transverse myelitis 10/10/2012  . HTN (hypertension) 08/29/2012  . Arthritis of neck 08/29/2012  . Adenocarcinoma of unknown primary 02/06/2011  . Chest pain 08/16/2010  . Anxiety 05/19/2010  . CAD 12/03/2009  . GERD 12/03/2009  . ANGINA, UNSTABLE 12/02/2009   Outpatient Encounter Prescriptions as of 04/29/2014  Medication Sig  . ALPRAZolam (XANAX) 0.5 MG tablet TAKE ONE TABLET BY MOUTH THREE TIMES DAILY  . aspirin 81 MG tablet Take 1 tablet (81 mg total) by mouth daily.  . cetirizine (ZYRTEC) 10 MG tablet Take 1 tablet (10 mg total) by mouth 2 (two) times daily.  . Cholecalciferol (VITAMIN D PO) Take by mouth. OTC  . cyclobenzaprine (FLEXERIL) 5 MG tablet Take 1 tablet (5 mg total) by mouth 3 (three) times daily as needed for muscle spasms.  Marland Kitchen loperamide (IMODIUM) 2 MG capsule TAKE ONE CAPSULE BY MOUTH FOUR TIMES DAILY AS NEEDED FOR DIARRHEA / LOOSE STOOLS  . nitroGLYCERIN (NITROSTAT) 0.4 MG SL tablet Place 1 tablet (0.4 mg total) under the tongue every 5 (five) minutes as needed. For chest pain  . pantoprazole (PROTONIX) 40 MG tablet TAKE ONE TABLET BY MOUTH TWICE DAILY.  Marland Kitchen triamterene-hydrochlorothiazide (MAXZIDE-25) 37.5-25 MG per tablet Take 1 tablet by mouth 3 (three) times a week.   . verapamil (CALAN) 80 MG tablet TAKE ONE TABLET BY MOUTH FOUR TIMES DAILY AFTER MEALS AND AT BEDTIME  . traMADol (ULTRAM) 50 MG tablet Take 50 mg by mouth every 12 (twelve) hours as needed.   Marland Kitchen VIGAMOX 0.5 % ophthalmic solution Place 1 drop into the right eye 4 (four) times daily.       Review of Systems  HENT: Positive for congestion.   Respiratory: Negative.   Cardiovascular: Negative.   Neurological: Negative.        Objective:   Physical Exam  Constitutional: She is oriented to person, place, and time. She appears well-developed.  HENT:  Head: Normocephalic.  Neck: Normal range of motion.  Cardiovascular: Normal rate.   Pulmonary/Chest: Effort normal.  Neurological: She is alert and oriented to person, place, and time.    BP 131/76 mmHg  Pulse 56  Temp(Src) 97.2 F (36.2 C) (Oral)  Ht 5\' 3"  (1.6 m)  Wt 160 lb (72.576 kg)  BMI 28.35 kg/m2        Assessment & Plan:  1. Sinusitis chronic, sphenoidal Symptoms are nonspecific. I will treat her as this is if this is a sinus infection and watch for resolution of symptoms. Begin Augmentin Mucinex and drink plenty of fluids. If symptoms do not resolve we'll check some blood work. Of note she had MRI of her head neck and back related to some weakness in her foot and ankle. The studies were apparently done at [facility] within the last 34 months.

## 2014-05-01 ENCOUNTER — Encounter (HOSPITAL_COMMUNITY): Payer: Medicare Other | Admitting: Physical Therapy

## 2014-05-04 ENCOUNTER — Ambulatory Visit (HOSPITAL_COMMUNITY): Payer: Medicare Other | Admitting: Physical Therapy

## 2014-05-05 DIAGNOSIS — Z961 Presence of intraocular lens: Secondary | ICD-10-CM | POA: Diagnosis not present

## 2014-05-06 ENCOUNTER — Ambulatory Visit (HOSPITAL_COMMUNITY): Payer: Medicare Other | Admitting: Physical Therapy

## 2014-05-06 ENCOUNTER — Other Ambulatory Visit: Payer: Self-pay | Admitting: *Deleted

## 2014-05-06 ENCOUNTER — Telehealth: Payer: Self-pay | Admitting: Family Medicine

## 2014-05-06 DIAGNOSIS — M25671 Stiffness of right ankle, not elsewhere classified: Secondary | ICD-10-CM

## 2014-05-06 DIAGNOSIS — M25561 Pain in right knee: Secondary | ICD-10-CM | POA: Diagnosis not present

## 2014-05-06 DIAGNOSIS — R262 Difficulty in walking, not elsewhere classified: Secondary | ICD-10-CM

## 2014-05-06 DIAGNOSIS — G959 Disease of spinal cord, unspecified: Secondary | ICD-10-CM

## 2014-05-06 DIAGNOSIS — N951 Menopausal and female climacteric states: Secondary | ICD-10-CM

## 2014-05-06 DIAGNOSIS — R29898 Other symptoms and signs involving the musculoskeletal system: Secondary | ICD-10-CM | POA: Diagnosis not present

## 2014-05-06 NOTE — Telephone Encounter (Signed)
Reviewed all InBasket folders and tabs within chart review, not sure why patient received call. Her & I talked through this as I looked for a possible reason for a call yesterday

## 2014-05-06 NOTE — Therapy (Signed)
[place], [place], [address] Phone: [phone]   Fax:  [phone]  Physical Therapy Treatment  Patient Details  Name: Amy Black MRN: [id number] Date of Birth: [birth year] Referring Provider:  Pieter Partridge, DO  Encounter Date: 05/06/2014      PT End of Session - 05/06/14 1431    Visit Number 11   Number of Visits 16   Date for PT Re-Evaluation 05/20/14   Authorization Type [employer] medicare   Authorization Time Period G-codes done 10th visit   Authorization - Visit Number 11   Authorization - Number of Visits 16   PT Start Time 1350   PT Stop Time 1434   PT Time Calculation (min) 44 min   Equipment Utilized During Treatment Gait belt   Activity Tolerance Patient tolerated treatment well      Past Medical History  Diagnosis Date  . CAD (coronary artery disease)   . Irritable bowel syndrome   . Diverticulosis of colon (without mention of hemorrhage)   . hepatic cyst   . Hyperlipidemia   . Adenosquamous carcinoma     left leg 2004 - radiation & resection  . HTN (hypertension)     PT. DENIES  . History of cardiac catheterization   . Unstable angina   . Adenocarcinoma     LEFT LEG  . Insomnia   . Vitamin D deficiency   . Adenocarcinoma of unknown primary 02/06/2011  . History of nuclear stress test 04/2011    lexiscan; normal study, no significant ischemia, low risk   . Coronary artery spasm     Past Surgical History  Procedure Laterality Date  . Resection soft tissue tumor leg / ankle radical  2004    leg lesion resection & radiation  . Diagnostic laparoscopy    . Lysis of adhesion    . Salpingoophorectomy Left   . Abdominal hysterectomy    . Pelvic laparoscopy  1992    RSO, AND LSO ON 2007  . Cardiac catheterization      coronary spasm  . Transthoracic echocardiogram  07/2012    LV cavity size mildly reduced, normal wall motion; MV with calcified annulus and mild MR; LA mildly dilated;  atrial septum with increased thickness - lipomatous hypertrophy; RV systolic pressure increased (borderline pulm HTN)    There were no vitals filed for this visit.  Visit Diagnosis:  Difficulty walking  Right leg weakness  Ankle stiffness, right  Myelopathy  Pain in joint, lower leg, right      Subjective Assessment - 05/06/14 1424    Symptoms Pt states she is doing her exercises at home at least once a day.  Pt has no questions.    Currently in Pain? No/denies   Pain Score 0-No pain            [facility] Adult PT Treatment/Exercise - 05/06/14 1426    Lumbar Exercises: Stretches   Passive Hamstring Stretch 3 reps;30 seconds   Passive Hamstring Stretch Limitations slant board    Lumbar Exercises: Aerobic   Stationary Bike nustep L4 x 10:00   Lumbar Exercises: Standing   Heel Raises 10 reps   Heel Raises Limitations no UE    Functional Squats 10 reps   Functional Squats Limitations no UE   Forward Lunge 15 reps   Forward Lunge Limitations 12" box    Other Standing Lumbar Exercises Rocker board Rt/Lt and ant./post x 2 ' each; marching on foam x  10; SLS with one finger touch x 10; tandem gt on balance beam; retro gt on balnce beam   Other Standing Lumbar Exercises step over 4" hurdles on balance beam; heel walk x 2 Rt                 PT Education - 05/06/14 1431    Education Details to complete SLS exercises at home for improved balance   Methods Explanation   Comprehension Verbalized understanding;Returned demonstration          PT Short Term Goals - 04/22/14 1506    PT SHORT TERM GOAL #1   Title I in HEP   Status Achieved   PT SHORT TERM GOAL #2   Title Pt pain to be no greater than a 5/10 80% of the time   Status On-going   PT SHORT TERM GOAL #3   Title Pt ankle dorsiflexion to improve to -5 to allow a more normalize gait   Status Achieved   PT SHORT TERM GOAL #4   Title Pt mm to be improved by one grade    Status Achieved           PT Long  Term Goals - 04/22/14 1506    PT LONG TERM GOAL #1   Title Pt to be I in advance HEP   Status On-going   PT LONG TERM GOAL #2   Title Pain level to be no greater than a 3/10 80% of the time   PT LONG TERM GOAL #3   Title Pt ankle ROM to be improved to 5 degrees to allow for a normalize gait    PT LONG TERM GOAL #4   Title Pt to be able to SLS x 5 seconds on Rt for decreased risk of falling   PT LONG TERM GOAL #5   Title Pt to feel comfortable walking in her home without an assistive device                Plan - 05/06/14 1432    Clinical Impression Statement Pt able to complete exercises with minimal verbal cuing.  Pt has difficulty with SLS on Rt LE.  Pt fatigued after treatment.    PT Next Visit Plan begin cone rotation on foam         Problem List Patient Active Problem List   Diagnosis Date Noted  . Baker's cyst of knee 02/14/2013  . Coronary artery spasm 11/19/2012  . Transverse myelitis 10/10/2012  . HTN (hypertension) 08/29/2012  . Arthritis of neck 08/29/2012  . Adenocarcinoma of unknown primary 02/06/2011  . Chest pain 08/16/2010  . Anxiety 05/19/2010  . CAD 12/03/2009  . GERD 12/03/2009  . ANGINA, UNSTABLE 12/02/2009   Rayetta Humphrey, PT CLT [phone] 05/06/2014, 2:35 PM  [facility] [address] [place], [place], [address] Phone: [phone]   Fax:  [phone]

## 2014-05-07 ENCOUNTER — Other Ambulatory Visit: Payer: Self-pay | Admitting: Family Medicine

## 2014-05-13 ENCOUNTER — Ambulatory Visit (HOSPITAL_COMMUNITY): Payer: Medicare Other | Attending: Family Medicine | Admitting: Physical Therapy

## 2014-05-13 ENCOUNTER — Telehealth: Payer: Self-pay | Admitting: Family Medicine

## 2014-05-13 DIAGNOSIS — R262 Difficulty in walking, not elsewhere classified: Secondary | ICD-10-CM | POA: Diagnosis not present

## 2014-05-13 DIAGNOSIS — G959 Disease of spinal cord, unspecified: Secondary | ICD-10-CM

## 2014-05-13 DIAGNOSIS — R29898 Other symptoms and signs involving the musculoskeletal system: Secondary | ICD-10-CM | POA: Diagnosis not present

## 2014-05-13 DIAGNOSIS — M25671 Stiffness of right ankle, not elsewhere classified: Secondary | ICD-10-CM | POA: Diagnosis not present

## 2014-05-13 DIAGNOSIS — M25561 Pain in right knee: Secondary | ICD-10-CM | POA: Diagnosis not present

## 2014-05-13 NOTE — Patient Instructions (Signed)
Prone Knee Flexion   Flchir le genou droit, en amenant le talon vers les fesses. Maintenir ____ secondes, puis redresser. On peut utiliser la jambe saine pour [employer] opre. Rpter ____ Leanora Ivanoff. Faire ____ sances par jour.  http://gt2.exer.us/307   Copyright  VHI. All rights reserved.  HIP / KNEE: Extension - Prone   Squeeze glutes. Raise leg up. Keep knee straight. ___ reps per set, ___ sets per day, ___ days per week   Copyright  VHI. All rights reserved.  Straight Leg Raise   Tighten stomach and slowly raise locked right leg ____ inches from floor. Repeat ____ times per set. Do ____ sets per session. Do ____ sessions per day.  http://orth.exer.us/1103   Copyright  VHI. All rights reserved.  Bridge [employer] small of back into mat, maintain pelvic tilt, roll up one vertebrae at a time. Focus on engaging posterior hip muscles. Hold for ____ breaths. Repeat ____ times.  Copyright  VHI. All rights reserved.  Abduction: Side Leg Lift (Eccentric) - Side-Lying   Lie on side. Lift top leg slightly higher than shoulder level. Keep top leg straight with body, toes pointing forward. Slowly lower for 3-5 seconds. ___ reps per set, ___ sets per day, ___ days per week. Add ___ lbs when you achieve ___ repetitions.  Copyright  VHI. All rights reserved.  Clam Shell 45 Degrees   Lying with hips and knees bent 45, one pillow between knees and ankles. Lift knee. Be sure pelvis does not roll backward. Do not arch back. Do ___ times, each leg, ___ times per day.  http://ss.exer.us/75   Copyright  VHI. All rights reserved.

## 2014-05-13 NOTE — Therapy (Signed)
[place], [place], [address] Phone: [phone]   Fax:  [phone]  Physical Therapy Treatment  Patient Details  Name: Amy Black MRN: [id number] Date of Birth: [birth year] Referring Provider:  Pieter Partridge, DO  Encounter Date: 05/13/2014      PT End of Session - 05/13/14 1856    Visit Number 12   Number of Visits 16   Date for PT Re-Evaluation 05/20/14   Authorization Type [employer] medicare   Authorization Time Period G-codes done 10th visit   Authorization - Visit Number 12   Authorization - Number of Visits 16   PT Start Time 1356   PT Stop Time 1435   PT Time Calculation (min) 39 min   Equipment Utilized During Treatment Gait belt   Activity Tolerance Patient tolerated treatment well   Behavior During Therapy WFL for tasks assessed/performed      Past Medical History  Diagnosis Date  . CAD (coronary artery disease)   . Irritable bowel syndrome   . Diverticulosis of colon (without mention of hemorrhage)   . hepatic cyst   . Hyperlipidemia   . Adenosquamous carcinoma     left leg 2004 - radiation & resection  . HTN (hypertension)     PT. DENIES  . History of cardiac catheterization   . Unstable angina   . Adenocarcinoma     LEFT LEG  . Insomnia   . Vitamin D deficiency   . Adenocarcinoma of unknown primary 02/06/2011  . History of nuclear stress test 04/2011    lexiscan; normal study, no significant ischemia, low risk   . Coronary artery spasm     Past Surgical History  Procedure Laterality Date  . Resection soft tissue tumor leg / ankle radical  2004    leg lesion resection & radiation  . Diagnostic laparoscopy    . Lysis of adhesion    . Salpingoophorectomy Left   . Abdominal hysterectomy    . Pelvic laparoscopy  1992    RSO, AND LSO ON 2007  . Cardiac catheterization      coronary spasm  . Transthoracic echocardiogram  07/2012    LV cavity size mildly reduced, normal wall motion;  MV with calcified annulus and mild MR; LA mildly dilated; atrial septum with increased thickness - lipomatous hypertrophy; RV systolic pressure increased (borderline pulm HTN)    There were no vitals filed for this visit.  Visit Diagnosis:  Difficulty walking  Right leg weakness  Ankle stiffness, right  Myelopathy  Pain in joint, lower leg, right      Subjective Assessment - 05/13/14 1855    Subjective Pt was 10 minutes late for appointment stating she was stuck in traffic.  PT states she cannot find her HEP.   Currently in Pain? No/denies                       [facility] Adult PT Treatment/Exercise - 05/13/14 1358    Lumbar Exercises: Stretches   Active Hamstring Stretch 3 reps;30 seconds   Active Hamstring Stretch Limitations 14 inch step   Passive Hamstring Stretch 3 reps;30 seconds   Passive Hamstring Stretch Limitations slant board    Lumbar Exercises: Aerobic   Stationary Bike nustep L4 x 10:00   Lumbar Exercises: Standing   Heel Raises 10 reps   Heel Raises Limitations no UE    Functional Squats 10 reps   Functional Squats Limitations no UE  Forward Lunge 10 reps   Forward Lunge Limitations 2" box    Side Lunge 10 reps   Side Lunge Limitations 2in box   Other Standing Lumbar Exercises step over 4" hurdles on balance beam; heel walk x 2 Rt                 PT Education - 05/13/14 1859    Education provided Yes   Education Details Updated HEP given   Person(s) Educated Patient   Methods Handout;Explanation   Comprehension Verbalized understanding          PT Short Term Goals - 04/22/14 1506    PT SHORT TERM GOAL #1   Title I in HEP   Status Achieved   PT SHORT TERM GOAL #2   Title Pt pain to be no greater than a 5/10 80% of the time   Status On-going   PT SHORT TERM GOAL #3   Title Pt ankle dorsiflexion to improve to -5 to allow a more normalize gait   Status Achieved   PT SHORT TERM GOAL #4   Title Pt mm to be improved by one  grade    Status Achieved           PT Long Term Goals - 04/22/14 1506    PT LONG TERM GOAL #1   Title Pt to be I in advance HEP   Status On-going   PT LONG TERM GOAL #2   Title Pain level to be no greater than a 3/10 80% of the time   PT LONG TERM GOAL #3   Title Pt ankle ROM to be improved to 5 degrees to allow for a normalize gait    PT LONG TERM GOAL #4   Title Pt to be able to SLS x 5 seconds on Rt for decreased risk of falling   PT LONG TERM GOAL #5   Title Pt to feel comfortable walking in her home without an assistive device                Plan - 05/13/14 1857    Clinical Impression Statement Pt given updated HEP.  Noted weakness in Rt LE as compared to LT LE.  Pt was able to complete exercises with therapist facilitation.  most difficulty with balance beam actvities.     PT Next Visit Plan Progress balance actvities and BLE stabiilty exercises.  Add single leg balance reach and balance activities outside BOS.         Problem List Patient Active Problem List   Diagnosis Date Noted  . Baker's cyst of knee 02/14/2013  . Coronary artery spasm 11/19/2012  . Transverse myelitis 10/10/2012  . HTN (hypertension) 08/29/2012  . Arthritis of neck 08/29/2012  . Adenocarcinoma of unknown primary 02/06/2011  . Chest pain 08/16/2010  . Anxiety 05/19/2010  . CAD 12/03/2009  . GERD 12/03/2009  . ANGINA, UNSTABLE 12/02/2009    Teena Irani, PTA/CLT [phone] 05/13/2014, 7:00 PM  [place], [place], [address] Phone: [phone]   Fax:  [phone]

## 2014-05-14 ENCOUNTER — Other Ambulatory Visit: Payer: Self-pay | Admitting: Family Medicine

## 2014-05-14 MED ORDER — TRAMADOL HCL 50 MG PO TABS: 50.0000 mg | ORAL_TABLET | Freq: Two times a day (BID) | ORAL | Status: DC | PRN

## 2014-05-14 NOTE — Progress Notes (Signed)
Detailed message left for patient that rx is ready to be picked up 

## 2014-05-15 ENCOUNTER — Ambulatory Visit (HOSPITAL_COMMUNITY): Payer: Medicare Other | Admitting: Physical Therapy

## 2014-05-18 ENCOUNTER — Ambulatory Visit (HOSPITAL_COMMUNITY): Payer: Medicare Other | Admitting: Physical Therapy

## 2014-05-20 ENCOUNTER — Telehealth (HOSPITAL_COMMUNITY): Payer: Self-pay | Admitting: Physical Therapy

## 2014-05-20 ENCOUNTER — Ambulatory Visit (HOSPITAL_COMMUNITY): Payer: Medicare Other | Admitting: Physical Therapy

## 2014-05-22 ENCOUNTER — Ambulatory Visit (HOSPITAL_COMMUNITY): Payer: Medicare Other | Admitting: Physical Therapy

## 2014-05-25 ENCOUNTER — Ambulatory Visit (HOSPITAL_COMMUNITY): Payer: Medicare Other | Admitting: Physical Therapy

## 2014-05-26 ENCOUNTER — Other Ambulatory Visit: Payer: Self-pay

## 2014-05-26 MED ORDER — TRIAMTERENE-HCTZ 37.5-25 MG PO TABS: 1.0000 | ORAL_TABLET | ORAL | Status: DC

## 2014-05-26 NOTE — Telephone Encounter (Signed)
Rx(s) sent to pharmacy electronically.  

## 2014-05-27 ENCOUNTER — Ambulatory Visit (HOSPITAL_COMMUNITY): Payer: Medicare Other | Admitting: Physical Therapy

## 2014-05-27 DIAGNOSIS — M25671 Stiffness of right ankle, not elsewhere classified: Secondary | ICD-10-CM | POA: Diagnosis not present

## 2014-05-27 DIAGNOSIS — R29898 Other symptoms and signs involving the musculoskeletal system: Secondary | ICD-10-CM

## 2014-05-27 DIAGNOSIS — G959 Disease of spinal cord, unspecified: Secondary | ICD-10-CM

## 2014-05-27 DIAGNOSIS — M25561 Pain in right knee: Secondary | ICD-10-CM | POA: Diagnosis not present

## 2014-05-27 DIAGNOSIS — R262 Difficulty in walking, not elsewhere classified: Secondary | ICD-10-CM

## 2014-05-27 NOTE — Therapy (Signed)
[place], [place], [address] Phone: [phone]   Fax:  [phone]  Physical Therapy Treatment  Patient Details  Name: Amy Black MRN: [id number] Date of Birth: 1940/12/05 Referring Provider:  Pieter Partridge, DO  Encounter Date: 05/27/2014      PT End of Session - 05/27/14 1545    Visit Number 13   Number of Visits 16   Authorization Type [employer] medicare   Authorization Time Period G-codes done 10th visit   Authorization - Visit Number 13   Authorization - Number of Visits 16   PT Start Time 1300   PT Stop Time 1345   PT Time Calculation (min) 45 min   Activity Tolerance Patient tolerated treatment well   Behavior During Therapy [facility] for tasks assessed/performed      Past Medical History  Diagnosis Date  . CAD (coronary artery disease)   . Irritable bowel syndrome   . Diverticulosis of colon (without mention of hemorrhage)   . hepatic cyst   . Hyperlipidemia   . Adenosquamous carcinoma     left leg 2004 - radiation & resection  . HTN (hypertension)     PT. DENIES  . History of cardiac catheterization   . Unstable angina   . Adenocarcinoma     LEFT LEG  . Insomnia   . Vitamin D deficiency   . Adenocarcinoma of unknown primary 02/06/2011  . History of nuclear stress test 04/2011    lexiscan; normal study, no significant ischemia, low risk   . Coronary artery spasm     Past Surgical History  Procedure Laterality Date  . Resection soft tissue tumor leg / ankle radical  2004    leg lesion resection & radiation  . Diagnostic laparoscopy    . Lysis of adhesion    . Salpingoophorectomy Left   . Abdominal hysterectomy    . Pelvic laparoscopy  1992    RSO, AND LSO ON 2007  . Cardiac catheterization      coronary spasm  . Transthoracic echocardiogram  07/2012    LV cavity size mildly reduced, normal wall motion; MV with calcified annulus and mild MR; LA mildly dilated; atrial septum with increased  thickness - lipomatous hypertrophy; RV systolic pressure increased (borderline pulm HTN)    There were no vitals filed for this visit.  Visit Diagnosis:  Difficulty walking  Right leg weakness  Ankle stiffness, right  Myelopathy  Pain in joint, lower leg, right      Subjective Assessment - 05/27/14 1450    Subjective Pt states she woke last night to go to rest room and had pain into her Rt groin that with sharp pains made her think her leg was going to give and cause her to fall with walking.   Currently in Pain? Yes   Pain Score 8    Pain Location Groin   Pain Orientation Right                         [facility] Adult PT Treatment/Exercise - 05/27/14 1344    Lumbar Exercises: Stretches   Active Hamstring Stretch 3 reps;30 seconds   Active Hamstring Stretch Limitations 14 inch step 3 ways   Passive Hamstring Stretch 3 reps;30 seconds   Passive Hamstring Stretch Limitations slant board    Piriformis Stretch 2 reps;30 seconds   Piriformis Stretch Limitations seated   Lumbar Exercises: Aerobic   Stationary Bike --  Lumbar Exercises: Standing   Heel Raises 10 reps   Heel Raises Limitations no UE    Lumbar Exercises: Seated   Other Seated Lumbar Exercises sit to stand 10 reps without UE's   Lumbar Exercises: Supine   Bridge 10 reps   Straight Leg Raise 10 reps   Other Supine Lumbar Exercises therapist piriformis and hip stretches bilaterally   Knee/Hip Exercises: Standing   Gait Training in department following MET working on heel toe without dragging Rt LE   Manual Therapy   Manual Therapy Joint mobilization   Joint Mobilization muscle energy technigue to correct Rt anterior hip rotation.                  PT Short Term Goals - 04/22/14 1506    PT SHORT TERM GOAL #1   Title I in HEP   Status Achieved   PT SHORT TERM GOAL #2   Title Pt pain to be no greater than a 5/10 80% of the time   Status On-going   PT SHORT TERM GOAL #3   Title Pt  ankle dorsiflexion to improve to -5 to allow a more normalize gait   Status Achieved   PT SHORT TERM GOAL #4   Title Pt mm to be improved by one grade    Status Achieved           PT Long Term Goals - 04/22/14 1506    PT LONG TERM GOAL #1   Title Pt to be I in advance HEP   Status On-going   PT LONG TERM GOAL #2   Title Pain level to be no greater than a 3/10 80% of the time   PT LONG TERM GOAL #3   Title Pt ankle ROM to be improved to 5 degrees to allow for a normalize gait    PT LONG TERM GOAL #4   Title Pt to be able to SLS x 5 seconds on Rt for decreased risk of falling   PT LONG TERM GOAL #5   Title Pt to feel comfortable walking in her home without an assistive device                Plan - 05/27/14 1459    Clinical Impression Statement Pt with new pain into her Rt groin today.  Rt hip anteriorly rotated and corrected with muscle energy technique. Also with extremely tight Rt piriformis muscle and able to achieve good stretch with manual facilitation.   Pt reported being painfree following until end of session.  Pt encouraged to continue streteches and HEP and to follow up with MD if pain returns and continuues.  Pt verbalized understandfing.    PT Next Visit Plan Progress balance actvities and BLE stabiilty exercises.  Resume standing exercises if pain reduced and add single leg balance reach and balance activities outside BOS.         Problem List Patient Active Problem List   Diagnosis Date Noted  . Baker's cyst of knee 02/14/2013  . Coronary artery spasm 11/19/2012  . Transverse myelitis 10/10/2012  . HTN (hypertension) 08/29/2012  . Arthritis of neck 08/29/2012  . Adenocarcinoma of unknown primary 02/06/2011  . Chest pain 08/16/2010  . Anxiety 05/19/2010  . CAD 12/03/2009  . GERD 12/03/2009  . ANGINA, UNSTABLE 12/02/2009    Teena Irani, PTA/CLT [phone]  05/27/2014, 3:48 PM  [place] [address] [place], [place], [address] Phone: [phone]  Fax:  [phone]

## 2014-05-29 ENCOUNTER — Ambulatory Visit (HOSPITAL_COMMUNITY): Payer: Medicare Other | Admitting: Physical Therapy

## 2014-06-01 ENCOUNTER — Ambulatory Visit (HOSPITAL_COMMUNITY): Payer: Medicare Other | Admitting: Physical Therapy

## 2014-06-01 DIAGNOSIS — M25561 Pain in right knee: Secondary | ICD-10-CM | POA: Diagnosis not present

## 2014-06-01 DIAGNOSIS — R29898 Other symptoms and signs involving the musculoskeletal system: Secondary | ICD-10-CM

## 2014-06-01 DIAGNOSIS — M25671 Stiffness of right ankle, not elsewhere classified: Secondary | ICD-10-CM

## 2014-06-01 DIAGNOSIS — G959 Disease of spinal cord, unspecified: Secondary | ICD-10-CM

## 2014-06-01 DIAGNOSIS — R262 Difficulty in walking, not elsewhere classified: Secondary | ICD-10-CM

## 2014-06-01 NOTE — Therapy (Signed)
[place], [place], [address] Phone: [phone]   Fax:  [phone]  Physical Therapy Treatment  Patient Details  Name: Amy Black MRN: [id number] Date of Birth: 1940/08/23 Referring Provider:  Pieter Partridge, DO  Encounter Date: 06/01/2014      PT End of Session - 06/01/14 1815    Visit Number 14   Number of Visits 16   Authorization Type [employer] medicare   Authorization Time Period G-codes done 10th visit   Authorization - Visit Number 14   Authorization - Number of Visits 16   Activity Tolerance Patient tolerated treatment well   Behavior During Therapy [facility] for tasks assessed/performed      Past Medical History  Diagnosis Date  . CAD (coronary artery disease)   . Irritable bowel syndrome   . Diverticulosis of colon (without mention of hemorrhage)   . hepatic cyst   . Hyperlipidemia   . Adenosquamous carcinoma     left leg 2004 - radiation & resection  . HTN (hypertension)     PT. DENIES  . History of cardiac catheterization   . Unstable angina   . Adenocarcinoma     LEFT LEG  . Insomnia   . Vitamin D deficiency   . Adenocarcinoma of unknown primary 02/06/2011  . History of nuclear stress test 04/2011    lexiscan; normal study, no significant ischemia, low risk   . Coronary artery spasm     Past Surgical History  Procedure Laterality Date  . Resection soft tissue tumor leg / ankle radical  2004    leg lesion resection & radiation  . Diagnostic laparoscopy    . Lysis of adhesion    . Salpingoophorectomy Left   . Abdominal hysterectomy    . Pelvic laparoscopy  1992    RSO, AND LSO ON 2007  . Cardiac catheterization      coronary spasm  . Transthoracic echocardiogram  07/2012    LV cavity size mildly reduced, normal wall motion; MV with calcified annulus and mild MR; LA mildly dilated; atrial septum with increased thickness - lipomatous hypertrophy; RV systolic pressure increased (borderline  pulm HTN)    There were no vitals filed for this visit.  Visit Diagnosis:  Difficulty walking  Right leg weakness  Ankle stiffness, right  Myelopathy  Pain in joint, lower leg, right      Subjective Assessment - 06/01/14 1308    Subjective PT States she has not had any pain since her last visit here.     Currently in Pain? No/denies                         [facility] Adult PT Treatment/Exercise - 06/01/14 1309    Lumbar Exercises: Stretches   Active Hamstring Stretch 3 reps;30 seconds   Active Hamstring Stretch Limitations 14 inch step 3 ways   Passive Hamstring Stretch 3 reps;30 seconds   Passive Hamstring Stretch Limitations slant board    Piriformis Stretch 2 reps;30 seconds   Piriformis Stretch Limitations seated   Lumbar Exercises: Aerobic   Stationary Bike nustep L4 x 10:00   Lumbar Exercises: Standing   Heel Raises 10 reps   Heel Raises Limitations no UE    Forward Lunge 10 reps   Forward Lunge Limitations 2" box    Side Lunge 10 reps   Side Lunge Limitations 2in box   Lumbar Exercises: Seated   Other Seated Lumbar Exercises  sit to stand 10 reps without UE's   Lumbar Exercises: Supine   Bridge --   Straight Leg Raise --   Knee/Hip Exercises: Standing   Gait Training --   Manual Therapy   Manual Therapy --   Joint Mobilization --             Balance Exercises - 06/01/14 1412    Balance Exercises: Standing   Balance Beam 3RT forward and retro    Sidestepping 2 reps;Theraband   Heel Raises Limitations 1 RT walk   Toe Raise Limitations 1 RT walk             PT Short Term Goals - 04/22/14 1506    PT SHORT TERM GOAL #1   Title I in HEP   Status Achieved   PT SHORT TERM GOAL #2   Title Pt pain to be no greater than a 5/10 80% of the time   Status On-going   PT SHORT TERM GOAL #3   Title Pt ankle dorsiflexion to improve to -5 to allow a more normalize gait   Status Achieved   PT SHORT TERM GOAL #4   Title Pt mm to be  improved by one grade    Status Achieved           PT Long Term Goals - 04/22/14 1506    PT LONG TERM GOAL #1   Title Pt to be I in advance HEP   Status On-going   PT LONG TERM GOAL #2   Title Pain level to be no greater than a 3/10 80% of the time   PT LONG TERM GOAL #3   Title Pt ankle ROM to be improved to 5 degrees to allow for a normalize gait    PT LONG TERM GOAL #4   Title Pt to be able to SLS x 5 seconds on Rt for decreased risk of falling   PT LONG TERM GOAL #5   Title Pt to feel comfortable walking in her home without an assistive device                Plan - 06/01/14 1815    Clinical Impression Statement Able to resume all standing exericses today and Pt able to complete without difficulty.  Unable to complete all 10 minutes on nustep at end due to c/o SOB. BP taken at 95/61, HR 69bpm. Pt reported this was normal for her.  suggested patient contact MD or go to ED if symptoms persist.  PT left clinic with spouse.     PT Next Visit Plan Progress balance actvities and BLE stabiilty exercises.  Add single leg balance reach and balance activities outside BOS. Re-evaluate X 2 more visits.        Problem List Patient Active Problem List   Diagnosis Date Noted  . Baker's cyst of knee 02/14/2013  . Coronary artery spasm 11/19/2012  . Transverse myelitis 10/10/2012  . HTN (hypertension) 08/29/2012  . Arthritis of neck 08/29/2012  . Adenocarcinoma of unknown primary 02/06/2011  . Chest pain 08/16/2010  . Anxiety 05/19/2010  . CAD 12/03/2009  . GERD 12/03/2009  . ANGINA, UNSTABLE 12/02/2009    Teena Irani, PTA/CLT [phone] 06/01/2014, 6:18 PM  [place] [address] [place], [place], [address] Phone: [phone]   Fax:  [phone]

## 2014-06-03 ENCOUNTER — Telehealth (HOSPITAL_COMMUNITY): Payer: Self-pay | Admitting: Physical Therapy

## 2014-06-03 ENCOUNTER — Ambulatory Visit (HOSPITAL_COMMUNITY): Payer: Medicare Other | Admitting: Physical Therapy

## 2014-06-03 NOTE — Telephone Encounter (Signed)
She has to work and can not come in today

## 2014-06-05 ENCOUNTER — Ambulatory Visit (HOSPITAL_COMMUNITY): Payer: Medicare Other | Admitting: Physical Therapy

## 2014-06-08 ENCOUNTER — Ambulatory Visit (HOSPITAL_COMMUNITY): Payer: Medicare Other | Attending: Family Medicine | Admitting: Physical Therapy

## 2014-06-08 DIAGNOSIS — G959 Disease of spinal cord, unspecified: Secondary | ICD-10-CM

## 2014-06-08 DIAGNOSIS — M25671 Stiffness of right ankle, not elsewhere classified: Secondary | ICD-10-CM

## 2014-06-08 DIAGNOSIS — R29898 Other symptoms and signs involving the musculoskeletal system: Secondary | ICD-10-CM | POA: Diagnosis not present

## 2014-06-08 DIAGNOSIS — M25561 Pain in right knee: Secondary | ICD-10-CM

## 2014-06-08 DIAGNOSIS — R262 Difficulty in walking, not elsewhere classified: Secondary | ICD-10-CM | POA: Diagnosis not present

## 2014-06-08 NOTE — Patient Instructions (Signed)
KNEE: Flexion - Prone   Bend knee. Raise heel toward buttocks. Do not raise hips. 10___ reps per set, _2__ sets per day, __7_ days per week   Copyright  VHI. All rights reserved.  KNEE: Quadriceps - Prone   Place strap around ankle. Bring ankle toward buttocks. Press hip into surface. Hold __30_ seconds. __3_ reps per set, __2_ sets per day, __7_ days per week   Copyright  VHI. All rights reserved.

## 2014-06-08 NOTE — Therapy (Signed)
[place], [place], [address] Phone: [phone]   Fax:  [phone]  Physical Therapy  Re-evaluation  Patient Details  Name: Amy Black MRN: [id number] Date of Birth: 1940/04/15 Referring Provider:  [name], MD  Encounter Date: 06/08/2014      PT End of Session - 06/08/14 1529    Visit Number 15   Number of Visits 24   Date for PT Re-Evaluation 07/06/14   Authorization Type [employer] medicare   Authorization Time Period G-codes done 10th visit   Authorization - Visit Number 15   Authorization - Number of Visits 25   PT Start Time 1440   PT Stop Time 1530   PT Time Calculation (min) 50 min   Activity Tolerance Patient tolerated treatment well   Behavior During Therapy [facility] for tasks assessed/performed      Past Medical History  Diagnosis Date  . CAD (coronary artery disease)   . Irritable bowel syndrome   . Diverticulosis of colon (without mention of hemorrhage)   . hepatic cyst   . Hyperlipidemia   . Adenosquamous carcinoma     left leg 2004 - radiation & resection  . HTN (hypertension)     PT. DENIES  . History of cardiac catheterization   . Unstable angina   . Adenocarcinoma     LEFT LEG  . Insomnia   . Vitamin D deficiency   . Adenocarcinoma of unknown primary 02/06/2011  . History of nuclear stress test 04/2011    lexiscan; normal study, no significant ischemia, low risk   . Coronary artery spasm     Past Surgical History  Procedure Laterality Date  . Resection soft tissue tumor leg / ankle radical  2004    leg lesion resection & radiation  . Diagnostic laparoscopy    . Lysis of adhesion    . Salpingoophorectomy Left   . Abdominal hysterectomy    . Pelvic laparoscopy  1992    RSO, AND LSO ON 2007  . Cardiac catheterization      coronary spasm  . Transthoracic echocardiogram  07/2012    LV cavity size mildly reduced, normal wall motion; MV with calcified annulus and mild MR; LA  mildly dilated; atrial septum with increased thickness - lipomatous hypertrophy; RV systolic pressure increased (borderline pulm HTN)    There were no vitals filed for this visit.  Visit Diagnosis:  Difficulty walking  Right leg weakness  Ankle stiffness, right  Myelopathy  Pain in joint, lower leg, right      Subjective Assessment - 06/08/14 1448    Subjective PT states she had LBP on Saturday but currently without pain.     Currently in Pain? No/denies            [facility] PT Assessment - 06/08/14 1452    Assessment   Medical Diagnosis Leg pain   Onset Date 01/25/13   Next MD Visit primary MD 06/29/14   AROM   AROM Assessment Site Ankle   Right/Left Ankle Right   Right Ankle Dorsiflexion 0  was -12   Strength   Right/Left Hip Right   Right Hip Flexion 4+/5  was 4+/5   Right Hip Extension 3-/5  was 3+/5   Right Hip ABduction 4+/5  was 4-/5   Right Knee Flexion 3-/5  was 3-/5   Right Knee Extension 4+/5  was 5/5   Right Ankle Dorsiflexion 4/5  was 4/5   Flexibility   Quadriceps  Rt 11 inches ( was 8.3 inches heel to glute 3/2 and 7.5 inches 12/17), Lt 11 inches (was 4.3 inches heel to glute 3/2 and 4 inches 12/17)   Ambulation/Gait   Ambulation/Gait Yes   Berg Balance Test   Sit to Stand Able to stand without using hands and stabilize independently   Standing Unsupported Able to stand safely 2 minutes   Sitting with Back Unsupported but Feet Supported on Floor or Stool Able to sit safely and securely 2 minutes   Stand to Sit Sits safely with minimal use of hands   Transfers Able to transfer safely, minor use of hands   Standing Unsupported with Eyes Closed Able to stand 10 seconds safely   Standing Ubsupported with Feet Together Able to place feet together independently and stand 1 minute safely   From Standing, Reach Forward with Outstretched Arm Can reach confidently >25 cm (10")   From Standing Position, Pick up Object from Floor Able to pick up shoe safely  and easily   From Standing Position, Turn to Look Behind Over each Shoulder Looks behind from both sides and weight shifts well   Turn 360 Degrees Able to turn 360 degrees safely one side only in 4 seconds or less   Standing Unsupported, Alternately Place Feet on Step/Stool Able to stand independently and complete 8 steps >20 seconds   Standing Unsupported, One Foot in Front Able to take small step independently and hold 30 seconds   Standing on One Leg Tries to lift leg/unable to hold 3 seconds but remains standing independently   Total Score 49              Balance Exercises - 06/08/14 1451    Balance Exercises: Standing   Standing Eyes Opened 2 reps           PT Education - 06/08/14 1525    Education provided Yes   Education Details updated HEP to include knee flexion and quad stretch   Person(s) Educated Patient   Methods Explanation;Handout   Comprehension Verbalized understanding          PT Short Term Goals - 06/08/14 1533    PT SHORT TERM GOAL #1   Title I in HEP   Time 1   Period Weeks   Status Achieved   PT SHORT TERM GOAL #2   Title Pt pain to be no greater than a 5/10 80% of the time   Time 4   Period Weeks   Status Achieved   PT SHORT TERM GOAL #3   Title Pt ankle dorsiflexion to improve to -5 to allow a more normalize gait   Time 4   Period Weeks   Status Achieved   PT SHORT TERM GOAL #4   Title Pt mm to be improved by one grade    Time 4   Period Weeks   Status Achieved           PT Long Term Goals - 06/08/14 1533    PT LONG TERM GOAL #1   Title Pt to be I in advance HEP   Time 6   Status On-going   PT LONG TERM GOAL #2   Title Pain level to be no greater than a 3/10 80% of the time   Time 6   Period Weeks   Status Achieved   PT LONG TERM GOAL #3   Title Pt ankle ROM to be improved to 5 degrees to allow for a normalize gait  Time 6   Period Weeks   Status On-going   PT LONG TERM GOAL #4   Title Pt to be able to SLS x 5  seconds on Rt for decreased risk of falling   Time 6   Period Weeks   Status On-going   PT LONG TERM GOAL #5   Title Pt to feel comfortable walking in her home without an assistive device    Time 6   Period Weeks   Status On-going               Plan - 06/08/14 1547    Clinical Impression Statement Reassessment completed today.  Pt has made minimal progress, however attendance has been sporatic due to medical reasons.  Pt has met all STG's and 1/5 LTG's.  Pt is concerned with her balance at this point and would like to focus on highter level actvities.  Pt continues to use [facility] for ambulation.  BERG completed today 49/56.  Rt LE remains weak in hamstring but improvmed hip abductor strength.  Pt would benefit from continued therapy to focus on these deficits.     Pt will benefit from skilled therapeutic intervention in order to improve on the following deficits Decreased balance;Abnormal gait;Decreased range of motion;Decreased strength;Difficulty walking;Pain   PT Frequency 2x / week   PT Duration 4 weeks   PT Treatment/Interventions Patient/family education;Therapeutic exercise;Therapeutic activities;Balance training   PT Next Visit Plan Continue therapy focusing on high level balance actvities and BLE stabiilty exercises.  Add single leg balance reach and balance activities outside BOS.    PT Home Exercise Plan given quad stretch and prone knee flexion.   Consulted and Agree with Plan of Care Patient          G-Codes - 2014-06-21 1601    Functional Assessment Tool Used clinical judgement, skilled clinical assessment   Functional Limitation Mobility: Walking and moving around   Mobility: Walking and Moving Around Current Status [phone]) At least 40 percent but less than 60 percent impaired, limited or restricted   Mobility: Walking and Moving Around Goal Status [phone]) At least 20 percent but less than 40 percent impaired, limited or restricted      Problem List Patient Active  Problem List   Diagnosis Date Noted  . Baker's cyst of knee 02/14/2013  . Coronary artery spasm 11/19/2012  . Transverse myelitis 10/10/2012  . HTN (hypertension) 08/29/2012  . Arthritis of neck 08/29/2012  . Adenocarcinoma of unknown primary 02/06/2011  . Chest pain 08/16/2010  . Anxiety 05/19/2010  . CAD 12/03/2009  . GERD 12/03/2009  . ANGINA, UNSTABLE 12/02/2009    Teena Irani, PTA/CLT [phone] 06-21-14, 4:02 PM  Rayetta Humphrey, PT CLT [place] [address] [place], [place], [address] Phone: [phone]   Fax:  [phone]

## 2014-06-09 DIAGNOSIS — M25671 Stiffness of right ankle, not elsewhere classified: Secondary | ICD-10-CM | POA: Diagnosis not present

## 2014-06-09 DIAGNOSIS — R29898 Other symptoms and signs involving the musculoskeletal system: Secondary | ICD-10-CM | POA: Diagnosis not present

## 2014-06-09 DIAGNOSIS — R262 Difficulty in walking, not elsewhere classified: Secondary | ICD-10-CM | POA: Diagnosis not present

## 2014-06-09 DIAGNOSIS — M25561 Pain in right knee: Secondary | ICD-10-CM | POA: Diagnosis not present

## 2014-06-10 ENCOUNTER — Ambulatory Visit (HOSPITAL_COMMUNITY): Payer: Medicare Other | Admitting: Physical Therapy

## 2014-06-10 DIAGNOSIS — M25561 Pain in right knee: Secondary | ICD-10-CM | POA: Diagnosis not present

## 2014-06-10 DIAGNOSIS — M25671 Stiffness of right ankle, not elsewhere classified: Secondary | ICD-10-CM

## 2014-06-10 DIAGNOSIS — R262 Difficulty in walking, not elsewhere classified: Secondary | ICD-10-CM | POA: Diagnosis not present

## 2014-06-10 DIAGNOSIS — R29898 Other symptoms and signs involving the musculoskeletal system: Secondary | ICD-10-CM

## 2014-06-10 DIAGNOSIS — G959 Disease of spinal cord, unspecified: Secondary | ICD-10-CM

## 2014-06-10 NOTE — Addendum Note (Signed)
Addended by: Leeroy Cha on: 06/10/2014 08:08 AM   Modules accepted: Orders

## 2014-06-10 NOTE — Therapy (Addendum)
[facility] [place], [place], [address] Phone: [phone]   Fax:  [phone]  Physical Therapy Treatment  Patient Details  Name: Amy Black MRN: [id number] Date of Birth: 06-08-1940 Referring Provider:  Pieter Partridge, DO  Encounter Date: 06/10/2014      PT End of Session - 06/10/14 1536    Visit Number 16   Number of Visits 24   Date for PT Re-Evaluation 07/06/14   Authorization Time Period G-codes done 04/07/22 visit   Authorization - Visit Number 42   Authorization - Number of Visits 25   PT Start Time 1350   PT Stop Time 1433   PT Time Calculation (min) 43 min   Equipment Utilized During Treatment Gait belt   Activity Tolerance Patient tolerated treatment well      Past Medical History  Diagnosis Date  . CAD (coronary artery disease)   . Irritable bowel syndrome   . Diverticulosis of colon (without mention of hemorrhage)   . hepatic cyst   . Hyperlipidemia   . Adenosquamous carcinoma     left leg 2004 - radiation & resection  . HTN (hypertension)     PT. DENIES  . History of cardiac catheterization   . Unstable angina   . Adenocarcinoma     LEFT LEG  . Insomnia   . Vitamin D deficiency   . Adenocarcinoma of unknown primary 02/06/2011  . History of nuclear stress test 04/2011    lexiscan; normal study, no significant ischemia, low risk   . Coronary artery spasm     Past Surgical History  Procedure Laterality Date  . Resection soft tissue tumor leg / ankle radical  2004    leg lesion resection & radiation  . Diagnostic laparoscopy    . Lysis of adhesion    . Salpingoophorectomy Left   . Abdominal hysterectomy    . Pelvic laparoscopy  1992    RSO, AND LSO ON 2007  . Cardiac catheterization      coronary spasm  . Transthoracic echocardiogram  07/2012    LV cavity size mildly reduced, normal wall motion; MV with calcified annulus and mild MR; LA mildly dilated; atrial septum with increased  thickness - lipomatous hypertrophy; RV systolic pressure increased (borderline pulm HTN)    There were no vitals filed for this visit.  Visit Diagnosis:  Difficulty walking  Right leg weakness  Ankle stiffness, right  Myelopathy  Pain in joint, lower leg, right      Subjective Assessment - 06/10/14 1350    Subjective Pt states that she has been doing her exercises at home    Currently in Pain? No/denies                Balance Exercises - 06/10/14 1429    Balance Exercises: Standing   Tandem Stance Eyes closed;2 reps   SLS with Vectors Solid surface;3 reps;10 secs   Rockerboard Anterior/posterior;Lateral;Other time (comment)  2 minutes   Balance Beam 2 RT 4 beams tandem as well as side stepping    Retro Gait 2 reps   Marching Limitations x10 hold for 4 seconds    Sit to Stand Time 10x    Other Standing Exercises warrior I pose x 30 sec x 2 both LE    Balance Exercises: Seated   Other Seated Exercises Nustep hills 3 level 4 x 10:             PT Short Term Goals -  06/08/14 1533    PT SHORT TERM GOAL #1   Title I in HEP   Time 1   Period Weeks   Status Achieved   PT SHORT TERM GOAL #2   Title Pt pain to be no greater than a 5/10 80% of the time   Time 4   Period Weeks   Status Achieved   PT SHORT TERM GOAL #3   Title Pt ankle dorsiflexion to improve to -5 to allow a more normalize gait   Time 4   Period Weeks   Status Achieved   PT SHORT TERM GOAL #4   Title Pt mm to be improved by one grade    Time 4   Period Weeks   Status Achieved           PT Long Term Goals - 06/08/14 1533    PT LONG TERM GOAL #1   Title Pt to be I in advance HEP   Time 6   Status On-going   PT LONG TERM GOAL #2   Title Pain level to be no greater than a 3/10 80% of the time   Time 6   Period Weeks   Status Achieved   PT LONG TERM GOAL #3   Title Pt ankle ROM to be improved to 5 degrees to allow for a normalize gait    Time 6   Period Weeks   Status  On-going   PT LONG TERM GOAL #4   Title Pt to be able to SLS x 5 seconds on Rt for decreased risk of falling   Time 6   Period Weeks   Status On-going   PT LONG TERM GOAL #5   Title Pt to feel comfortable walking in her home without an assistive device    Time 6   Period Weeks   Status On-going               Plan - 06/10/14 1538    Clinical Impression Statement All exercises facilitated by therapist for patient safety.  Pt needing min assist on all balance activites. Pt has noted decreased core strength allowing shoulder to go out of base of support whidh causes pt instability.    PT Next Visit Plan add all four oppoosite arm/leg raise as well as wall bumps      G8979  CJ G8980  CJ  Problem List Patient Active Problem List   Diagnosis Date Noted  . Baker's cyst of knee 02/14/2013  . Coronary artery spasm 11/19/2012  . Transverse myelitis 10/10/2012  . HTN (hypertension) 08/29/2012  . Arthritis of neck 08/29/2012  . Adenocarcinoma of unknown primary 02/06/2011  . Chest pain 08/16/2010  . Anxiety 05/19/2010  . CAD 12/03/2009  . GERD 12/03/2009  . ANGINA, UNSTABLE 12/02/2009   Rayetta Humphrey, PT CLT [phone] 06/10/2014, 3:43 PM  [facility] [address] [place], [place], [address] Phone: [phone]   Fax:  [phone]   PHYSICAL THERAPY DISCHARGE SUMMARY  Visits from Start of Care: 16  Current functional level related to goals / functional outcomes: unknown   Remaining deficits: balance   Education / Equipment: HEP  Plan: Patient agrees to discharge.  Patient goals were partially met. Patient is being discharged due to not returning since the last visit.  ?????        Rayetta Humphrey, [place] CLT [phone]

## 2014-06-10 NOTE — Addendum Note (Signed)
Addended by: Leeroy Cha on: 06/10/2014 12:37 PM   Modules accepted: Orders

## 2014-06-15 ENCOUNTER — Ambulatory Visit (HOSPITAL_COMMUNITY): Payer: Medicare Other | Admitting: Physical Therapy

## 2014-06-17 ENCOUNTER — Encounter: Payer: Self-pay | Admitting: Family Medicine

## 2014-06-17 ENCOUNTER — Ambulatory Visit (INDEPENDENT_AMBULATORY_CARE_PROVIDER_SITE_OTHER): Payer: Medicare Other | Admitting: Family Medicine

## 2014-06-17 ENCOUNTER — Ambulatory Visit (HOSPITAL_COMMUNITY): Payer: Medicare Other | Admitting: Physical Therapy

## 2014-06-17 ENCOUNTER — Other Ambulatory Visit: Payer: Medicare Other

## 2014-06-17 VITALS — BP 149/79 | HR 62 | Temp 97.0°F | Ht 63.0 in | Wt 158.0 lb

## 2014-06-17 DIAGNOSIS — R059 Cough, unspecified: Secondary | ICD-10-CM

## 2014-06-17 DIAGNOSIS — J329 Chronic sinusitis, unspecified: Secondary | ICD-10-CM | POA: Diagnosis not present

## 2014-06-17 DIAGNOSIS — J209 Acute bronchitis, unspecified: Secondary | ICD-10-CM | POA: Diagnosis not present

## 2014-06-17 DIAGNOSIS — R05 Cough: Secondary | ICD-10-CM | POA: Diagnosis not present

## 2014-06-17 DIAGNOSIS — R509 Fever, unspecified: Secondary | ICD-10-CM

## 2014-06-17 DIAGNOSIS — J310 Chronic rhinitis: Secondary | ICD-10-CM

## 2014-06-17 MED ORDER — AMOXICILLIN-POT CLAVULANATE 875-125 MG PO TABS: 1.0000 | ORAL_TABLET | Freq: Two times a day (BID) | ORAL | Status: DC

## 2014-06-17 NOTE — Addendum Note (Signed)
Addended by: Pollyann Kennedy F on: 06/17/2014 04:34 PM   Modules accepted: Orders

## 2014-06-17 NOTE — Progress Notes (Signed)
Subjective:    Patient ID: Amy Black, female    DOB: Mar 04, 1940, 74 y.o.   MRN: [id number]  HPI Patient here today for cough, congestion, allergies and sinus trouble that started on Sunday. She also complains of her eyes being watery. She is not sleeping well. And she's had a slight cough. She is also has some chills and maybe some fever.      Patient Active Problem List   Diagnosis Date Noted  . Baker's cyst of knee 02/14/2013  . Coronary artery spasm 11/19/2012  . Transverse myelitis 10/10/2012  . HTN (hypertension) 08/29/2012  . Arthritis of neck 08/29/2012  . Adenocarcinoma of unknown primary 02/06/2011  . Chest pain 08/16/2010  . Anxiety 05/19/2010  . CAD 12/03/2009  . GERD 12/03/2009  . ANGINA, UNSTABLE 12/02/2009   Outpatient Encounter Prescriptions as of 06/17/2014  Medication Sig  . ALPRAZolam (XANAX) 0.5 MG tablet TAKE ONE TABLET BY MOUTH THREE TIMES DAILY  . aspirin 81 MG tablet Take 1 tablet (81 mg total) by mouth daily.  . cetirizine (ZYRTEC) 10 MG tablet Take 1 tablet (10 mg total) by mouth 2 (two) times daily.  . Cholecalciferol (VITAMIN D PO) Take by mouth. OTC  . cyclobenzaprine (FLEXERIL) 5 MG tablet Take 1 tablet (5 mg total) by mouth 3 (three) times daily as needed for muscle spasms.  Marland Kitchen loperamide (IMODIUM) 2 MG capsule TAKE ONE CAPSULE BY MOUTH FOUR TIMES DAILY AS NEEDED FOR DIARRHEA / LOOSE STOOLS  . meloxicam (MOBIC) 15 MG tablet TAKE ONE TABLET BY MOUTH ONCE DAILY  . nitroGLYCERIN (NITROSTAT) 0.4 MG SL tablet Place 1 tablet (0.4 mg total) under the tongue every 5 (five) minutes as needed. For chest pain  . pantoprazole (PROTONIX) 40 MG tablet TAKE ONE TABLET BY MOUTH TWICE DAILY.  . traMADol (ULTRAM) 50 MG tablet Take 1 tablet (50 mg total) by mouth every 12 (twelve) hours as needed.  . triamterene-hydrochlorothiazide (MAXZIDE-25) 37.5-25 MG per tablet Take 1 tablet by mouth 3 (three) times a week.  . verapamil (CALAN) 80 MG tablet TAKE ONE TABLET  BY MOUTH FOUR TIMES DAILY AFTER MEALS AND AT BEDTIME  . VIGAMOX 0.5 % ophthalmic solution Place 1 drop into the right eye 4 (four) times daily.  . [DISCONTINUED] amoxicillin-clavulanate (AUGMENTIN) 875-125 MG per tablet Take 1 tablet by mouth 2 (two) times daily.   No facility-administered encounter medications on file as of 06/17/2014.      Review of Systems  Constitutional: Negative.   HENT: Positive for congestion, ear pain (pressure), postnasal drip and sinus pressure.        Allergies  Eyes: Positive for discharge.  Respiratory: Positive for cough.   Cardiovascular: Negative.   Gastrointestinal: Negative.   Endocrine: Negative.   Genitourinary: Negative.   Musculoskeletal: Negative.   Skin: Negative.   Allergic/Immunologic: Negative.   Neurological: Negative.   Hematological: Negative.   Psychiatric/Behavioral: Negative.        Objective:   Physical Exam  Constitutional: She is oriented to person, place, and time. She appears well-developed and well-nourished.  HENT:  Head: Normocephalic and atraumatic.  Right Ear: External ear normal.  Left Ear: External ear normal.  Mouth/Throat: Oropharynx is clear and moist.  Nasal congestion and turbinate swelling bilaterally  Eyes: Conjunctivae and EOM are normal. Pupils are equal, round, and reactive to light. Right eye exhibits no discharge. Left eye exhibits no discharge. No scleral icterus.  Neck: Normal range of motion. Neck supple. No thyromegaly present.  No  anterior cervical nodes  Cardiovascular: Normal rate, regular rhythm and normal heart sounds.   No murmur heard. Pulmonary/Chest: Effort normal and breath sounds normal. No respiratory distress. She has no wheezes. She has no rales. She exhibits no tenderness.  Dry cough  Musculoskeletal: She exhibits no edema or tenderness.  The patient uses a cane for ambulation and stability  Lymphadenopathy:    She has no cervical adenopathy.  Neurological: She is alert and  oriented to person, place, and time.  Skin: Skin is warm and dry. No rash noted.  Psychiatric: She has a normal mood and affect. Her behavior is normal. Judgment and thought content normal.  Nursing note and vitals reviewed.  BP 149/79 mmHg  Pulse 62  Temp(Src) 97 F (36.1 C) (Oral)  Ht 5\' 3"  (1.6 m)  Wt 158 lb (71.668 kg)  BMI 28.00 kg/m2  The CBC was not completed by the time the patient left the office.                                   Assessment & Plan:  1. Cough -Take Mucinex and drink plenty of fluids - POCT CBC - DG Chest 2 View; Future--- we will do a chest x-ray if the patient does not improve  2. Acute bronchitis, unspecified organism -Take Tylenol for aches pains and fever and drink plenty of fluids - amoxicillin-clavulanate (AUGMENTIN) 875-125 MG per tablet; Take 1 tablet by mouth 2 (two) times daily.  Dispense: 20 tablet; Refill: 0  3. Rhinosinusitis -Take antibiotic as directed - amoxicillin-clavulanate (AUGMENTIN) 875-125 MG per tablet; Take 1 tablet by mouth 2 (two) times daily.  Dispense: 20 tablet; Refill: 0  4. Chills with fever -Take Tylenol and drink plenty of fluids - POCT CBC - DG Chest 2 View; Future  Patient Instructions  Drink plenty of fluids Take Tylenol for aches pains and fever Use nasal saline frequently through the day and each nostril Take Mucinex maximum strength, blue and white in color, 1 twice daily for cough and congestion with a large glass of water Take antibiotic as directed till completed   Arrie Senate MD

## 2014-06-17 NOTE — Patient Instructions (Signed)
Drink plenty of fluids Take Tylenol for aches pains and fever Use nasal saline frequently through the day and each nostril Take Mucinex maximum strength, blue and white in color, 1 twice daily for cough and congestion with a large glass of water Take antibiotic as directed till completed

## 2014-06-17 NOTE — Addendum Note (Signed)
Addended by: Pollyann Kennedy F on: 06/17/2014 04:36 PM   Modules accepted: Orders

## 2014-06-18 LAB — CBC WITH DIFFERENTIAL/PLATELET
Basophils Absolute: 0 10*3/uL (ref 0.0–0.2)
Basos: 0 %
EOS (ABSOLUTE): 0.3 10*3/uL (ref 0.0–0.4)
Eos: 3 %
Hematocrit: 39.2 % (ref 34.0–46.6)
Hemoglobin: 12.5 g/dL (ref 11.1–15.9)
Immature Grans (Abs): 0 10*3/uL (ref 0.0–0.1)
Immature Granulocytes: 0 %
Lymphocytes Absolute: 2.3 10*3/uL (ref 0.7–3.1)
Lymphs: 25 %
MCH: 30 pg (ref 26.6–33.0)
MCHC: 31.9 g/dL (ref 31.5–35.7)
MCV: 94 fL (ref 79–97)
Monocytes Absolute: 0.6 10*3/uL (ref 0.1–0.9)
Monocytes: 6 %
Neutrophils Absolute: 5.9 10*3/uL (ref 1.4–7.0)
Neutrophils: 66 %
Platelets: 439 10*3/uL — ABNORMAL HIGH (ref 150–379)
RBC: 4.17 x10E6/uL (ref 3.77–5.28)
RDW: 14 % (ref 12.3–15.4)
WBC: 9.1 10*3/uL (ref 3.4–10.8)

## 2014-06-18 NOTE — Progress Notes (Signed)
lmtcb

## 2014-06-19 ENCOUNTER — Ambulatory Visit: Payer: Medicare Other | Admitting: *Deleted

## 2014-06-22 ENCOUNTER — Ambulatory Visit (HOSPITAL_COMMUNITY): Payer: Medicare Other | Admitting: Physical Therapy

## 2014-06-24 ENCOUNTER — Encounter (HOSPITAL_COMMUNITY): Payer: Medicare Other | Admitting: Physical Therapy

## 2014-06-25 ENCOUNTER — Telehealth: Payer: Self-pay | Admitting: *Deleted

## 2014-06-25 NOTE — Telephone Encounter (Signed)
Pt notified of results Verbalizes understanding 

## 2014-06-25 NOTE — Telephone Encounter (Signed)
-----   Message from Chipper Herb, MD sent at 06/18/2014  7:25 AM EDT ----- The CBC had a normal white blood cell count. The hemoglobin was good at 12.5 and the platelet count was slightly elevated as it has been in the past. The patient should take the antibiotic that was given to her if she does not get better , she should get back in touch with [place]

## 2014-06-25 NOTE — Telephone Encounter (Signed)
Left vm at [phone].  Please call for results.

## 2014-06-29 ENCOUNTER — Encounter (HOSPITAL_COMMUNITY): Payer: Medicare Other | Admitting: Physical Therapy

## 2014-06-29 ENCOUNTER — Encounter: Payer: Self-pay | Admitting: Family Medicine

## 2014-06-29 ENCOUNTER — Ambulatory Visit (INDEPENDENT_AMBULATORY_CARE_PROVIDER_SITE_OTHER): Payer: Medicare Other | Admitting: Family Medicine

## 2014-06-29 VITALS — BP 137/78 | HR 67 | Temp 97.3°F | Ht 63.0 in | Wt 159.0 lb

## 2014-06-29 DIAGNOSIS — G0489 Other myelitis: Secondary | ICD-10-CM

## 2014-06-29 DIAGNOSIS — I10 Essential (primary) hypertension: Secondary | ICD-10-CM

## 2014-06-29 DIAGNOSIS — G373 Acute transverse myelitis in demyelinating disease of central nervous system: Secondary | ICD-10-CM

## 2014-06-29 NOTE — Progress Notes (Signed)
   Subjective:    Patient ID: Amy Black, female    DOB: [birth year], 74 y.o.   MRN: [id number]  HPI 74 year old female who is here follow-up a pretension and transverse myelitis. She apparently has been in physical therapy in [place] for her balance. She feels like this is beneficial but the drive is too far from her home so she wants to transfer physical therapy to this office. She denies pain.    Review of Systems  Respiratory: Negative.   Cardiovascular: Negative.   Genitourinary: Negative.   Neurological: Positive for weakness.  Psychiatric/Behavioral: Negative.        Objective:   Physical Exam  Constitutional: She is oriented to person, place, and time. She appears well-developed and well-nourished.  Cardiovascular: Normal rate and regular rhythm.   Pulmonary/Chest: Effort normal and breath sounds normal.  Neurological: She is alert and oriented to person, place, and time.  Right knee is hyperreflexic compared to other reflexes  Psychiatric: She has a normal mood and affect. Her behavior is normal.          Assessment & Plan:  1. Transverse myelitis I'm not sure what might be improved with balance. Her Romberg was negative but tandem gait was poor but there is decreased strength on the right leg so I think this could be a focus of therapy - Ambulatory referral to Physical Therapy  2. Essential hypertension Blood pressure today 137/78 is good did not want it to be lower. She should continue with verapamil and triamterene  Wardell Honour MD

## 2014-07-01 ENCOUNTER — Encounter (HOSPITAL_COMMUNITY): Payer: Medicare Other | Admitting: Physical Therapy

## 2014-07-02 ENCOUNTER — Ambulatory Visit: Payer: Medicare Other | Admitting: Physical Therapy

## 2014-07-08 ENCOUNTER — Encounter (HOSPITAL_COMMUNITY): Payer: Medicare Other | Admitting: Physical Therapy

## 2014-07-08 ENCOUNTER — Ambulatory Visit: Payer: Medicare Other | Attending: Family Medicine | Admitting: Physical Therapy

## 2014-07-08 DIAGNOSIS — R2689 Other abnormalities of gait and mobility: Secondary | ICD-10-CM

## 2014-07-08 DIAGNOSIS — M25561 Pain in right knee: Secondary | ICD-10-CM | POA: Insufficient documentation

## 2014-07-08 DIAGNOSIS — R29898 Other symptoms and signs involving the musculoskeletal system: Secondary | ICD-10-CM | POA: Insufficient documentation

## 2014-07-08 DIAGNOSIS — G959 Disease of spinal cord, unspecified: Secondary | ICD-10-CM | POA: Insufficient documentation

## 2014-07-08 DIAGNOSIS — M25671 Stiffness of right ankle, not elsewhere classified: Secondary | ICD-10-CM | POA: Diagnosis not present

## 2014-07-08 DIAGNOSIS — R531 Weakness: Secondary | ICD-10-CM | POA: Insufficient documentation

## 2014-07-08 DIAGNOSIS — R262 Difficulty in walking, not elsewhere classified: Secondary | ICD-10-CM | POA: Insufficient documentation

## 2014-07-08 DIAGNOSIS — R29818 Other symptoms and signs involving the nervous system: Secondary | ICD-10-CM | POA: Diagnosis not present

## 2014-07-08 NOTE — Therapy (Signed)
[facility] [place], [place], [address] Phone: [phone]   Fax:  [phone]  Physical Therapy Evaluation  Patient Details  Name: Amy Black MRN: [id number] Date of Birth: [birth year] Referring Provider:  Wardell Honour, MD  Encounter Date: 07/08/2014      PT End of Session - 07/08/14 1315    Visit Number 1   Number of Visits 12   Date for PT Re-Evaluation 08/19/14   PT Start Time 1118   PT Stop Time 1146   PT Time Calculation (min) 28 min   Activity Tolerance Patient tolerated treatment well      Past Medical History  Diagnosis Date  . CAD (coronary artery disease)   . Irritable bowel syndrome   . Diverticulosis of colon (without mention of hemorrhage)   . hepatic cyst   . Hyperlipidemia   . Adenosquamous carcinoma     left leg 2004 - radiation & resection  . HTN (hypertension)     PT. DENIES  . History of cardiac catheterization   . Unstable angina   . Adenocarcinoma     LEFT LEG  . Insomnia   . Vitamin D deficiency   . Adenocarcinoma of unknown primary 02/06/2011  . History of nuclear stress test 04/2011    lexiscan; normal study, no significant ischemia, low risk   . Coronary artery spasm     Past Surgical History  Procedure Laterality Date  . Resection soft tissue tumor leg / ankle radical  2004    leg lesion resection & radiation  . Diagnostic laparoscopy    . Lysis of adhesion    . Salpingoophorectomy Left   . Abdominal hysterectomy    . Pelvic laparoscopy  1992    RSO, AND LSO ON 2007  . Cardiac catheterization      coronary spasm  . Transthoracic echocardiogram  07/2012    LV cavity size mildly reduced, normal wall motion; MV with calcified annulus and mild MR; LA mildly dilated; atrial septum with increased thickness - lipomatous hypertrophy; RV systolic pressure increased (borderline pulm HTN)    There were no vitals filed for this visit.  Visit Diagnosis:  Loss of balance -  Plan: PT plan of care cert/re-cert  Weakness - Plan: PT plan of care cert/re-cert      Subjective Assessment - 07/08/14 1122    Subjective Want to work my right hamstring and get balance back.   Patient Stated Goals Improve my balance and hamstring strength.   Currently in Pain? Other (Comment)  Pt not wanting tx for pain.            [facility] PT Assessment - 07/08/14 0001    Assessment   Medical Diagnosis Balance disorder.   Onset Date/Surgical Date --  01/2013.   Precautions   Precautions Fall   Precaution Comments Please provide supervised gait for additional safety.   Restrictions   Weight Bearing Restrictions No   Balance Screen   Has the patient fallen in the past 6 months No   Has the patient had a decrease in activity level because of a fear of falling?  Yes   Is the patient reluctant to leave their home because of a fear of falling?  No   Home Environment   Living Environment Private residence   Prior Function   Level of Independence Independent   Strength   Right Knee Flexion 3-/5   Right Ankle Dorsiflexion --  Dorsiflexion and eversion= 4/5.  Ambulation/Gait   Gait Comments Supervised gait with a straight cane.   Berg Balance Test   Sit to Stand Able to stand without using hands and stabilize independently   Standing Unsupported Able to stand safely 2 minutes   Sitting with Back Unsupported but Feet Supported on Floor or Stool Able to sit safely and securely 2 minutes   Stand to Sit Sits safely with minimal use of hands   Transfers Able to transfer safely, minor use of hands   Standing Unsupported with Eyes Closed Able to stand 10 seconds safely   Standing Ubsupported with Feet Together Able to place feet together independently and stand 1 minute safely   From Standing, Reach Forward with Outstretched Arm Can reach confidently >25 cm (10")   From Standing Position, Pick up Object from Floor Able to pick up shoe safely and easily   From Standing Position,  Turn to Look Behind Over each Shoulder Looks behind from both sides and weight shifts well   Turn 360 Degrees Able to turn 360 degrees safely in 4 seconds or less   Standing Unsupported, Alternately Place Feet on Step/Stool Able to stand independently and complete 8 steps >20 seconds   Standing Unsupported, One Foot in Front Able to take small step independently and hold 30 seconds   Standing on One Leg Able to lift leg independently and hold equal to or more than 3 seconds   Total Score 51                             PT Short Term Goals - 07-24-2014 1319    PT SHORT TERM GOAL #1   Title In with HEP.   Time 2   Period Weeks   PT SHORT TERM GOAL #4   Title Improve Berg score to 54/56.   Time 6   Period Weeks   Status New           PT Long Term Goals - 07/24/2014 1321    PT LONG TERM GOAL #2   Title Improve Berg score to 54/56.   Time 6   Period Weeks   Status New               Plan - 2014-07-24 1316    Clinical Impression Statement The patient presents to the clinic today with a CC of loss of balance and right hamstring weakness.  She is ambulating with a straight cane and reports no falls.   Pt will benefit from skilled therapeutic intervention in order to improve on the following deficits Decreased strength  Impaired balance.   PT Frequency 2x / week   PT Duration 6 weeks   PT Treatment/Interventions Patient/family education;Therapeutic exercise;Therapeutic activities;Balance training   PT Next Visit Plan Balance program and right hamstring and right ankle strengthening.          G-Codes - Jul 24, 2014 1322    Functional Assessment Tool Used Clinical judgement.   Functional Limitation Mobility: Walking and moving around   Mobility: Walking and Moving Around Current Status [phone]) At least 40 percent but less than 60 percent impaired, limited or restricted   Mobility: Walking and Moving Around Discharge Status [phone]) At least 20 percent but less  than 40 percent impaired, limited or restricted       Problem List Patient Active Problem List   Diagnosis Date Noted  . Baker's cyst of knee 02/14/2013  . Coronary artery spasm 11/19/2012  .  Transverse myelitis 10/10/2012  . HTN (hypertension) 08/29/2012  . Arthritis of neck 08/29/2012  . Adenocarcinoma of unknown primary 02/06/2011  . Chest pain 08/16/2010  . Anxiety 05/19/2010  . CAD 12/03/2009  . GERD 12/03/2009  . ANGINA, UNSTABLE 12/02/2009    Elmus Mathes, [place] MPT 07/08/2014, 1:25 PM  [facility] [address] [place], [place], [address] Phone: [phone]   Fax:  [phone]

## 2014-07-09 ENCOUNTER — Encounter: Payer: Self-pay | Admitting: Family Medicine

## 2014-07-09 ENCOUNTER — Ambulatory Visit (INDEPENDENT_AMBULATORY_CARE_PROVIDER_SITE_OTHER): Payer: Medicare Other | Admitting: Family Medicine

## 2014-07-09 VITALS — BP 106/71 | HR 66 | Temp 97.9°F | Ht 63.0 in | Wt 160.0 lb

## 2014-07-09 DIAGNOSIS — R350 Frequency of micturition: Secondary | ICD-10-CM

## 2014-07-09 DIAGNOSIS — R011 Cardiac murmur, unspecified: Secondary | ICD-10-CM

## 2014-07-09 LAB — POCT URINALYSIS DIPSTICK
Bilirubin, UA: NEGATIVE
Glucose, UA: NEGATIVE
Ketones, UA: NEGATIVE
Nitrite, UA: NEGATIVE
Protein, UA: NEGATIVE
Spec Grav, UA: <=1.005
Urobilinogen, UA: NEGATIVE
pH, UA: 5

## 2014-07-09 LAB — POCT UA - MICROSCOPIC ONLY
Casts, Ur, LPF, POC: NEGATIVE
Crystals, Ur, HPF, POC: NEGATIVE
Mucus, UA: NEGATIVE
Yeast, UA: NEGATIVE

## 2014-07-09 NOTE — Progress Notes (Signed)
   Subjective:    Patient ID: Amy Black, female    DOB: 05/20/1940, 74 y.o.   MRN: [id number]  HPI  74 year old female who is here today because of dependent edema. There is been no change in her medicines nor any increase in her breathing difficulty. She has been on verapamil for years and formerly took diarrhetic daily but states that that made her feel weak. She had been followed by Dr. Rollene Fare but since he has retired she has not been pleased with his successor.    Review of Systems  Constitutional: Negative.   HENT: Negative.   Respiratory: Negative.   Cardiovascular: Positive for leg swelling.  Neurological: Negative.        Patient Active Problem List   Diagnosis Date Noted  . Baker's cyst of knee 02/14/2013  . Coronary artery spasm 11/19/2012  . Transverse myelitis 10/10/2012  . HTN (hypertension) 08/29/2012  . Arthritis of neck 08/29/2012  . Adenocarcinoma of unknown primary 02/06/2011  . Chest pain 08/16/2010  . Anxiety 05/19/2010  . CAD 12/03/2009  . GERD 12/03/2009  . ANGINA, UNSTABLE 12/02/2009   Outpatient Encounter Prescriptions as of 07/09/2014  Medication Sig  . ALPRAZolam (XANAX) 0.5 MG tablet TAKE ONE TABLET BY MOUTH THREE TIMES DAILY  . aspirin 81 MG tablet Take 1 tablet (81 mg total) by mouth daily.  . cetirizine (ZYRTEC) 10 MG tablet Take 1 tablet (10 mg total) by mouth 2 (two) times daily.  . Cholecalciferol (VITAMIN D PO) Take by mouth. OTC  . cyclobenzaprine (FLEXERIL) 5 MG tablet Take 1 tablet (5 mg total) by mouth 3 (three) times daily as needed for muscle spasms.  Marland Kitchen loperamide (IMODIUM) 2 MG capsule TAKE ONE CAPSULE BY MOUTH FOUR TIMES DAILY AS NEEDED FOR DIARRHEA / LOOSE STOOLS  . meloxicam (MOBIC) 15 MG tablet TAKE ONE TABLET BY MOUTH ONCE DAILY  . nitroGLYCERIN (NITROSTAT) 0.4 MG SL tablet Place 1 tablet (0.4 mg total) under the tongue every 5 (five) minutes as needed. For chest pain  . pantoprazole (PROTONIX) 40 MG tablet TAKE ONE TABLET  BY MOUTH TWICE DAILY.  . traMADol (ULTRAM) 50 MG tablet Take 1 tablet (50 mg total) by mouth every 12 (twelve) hours as needed.  . triamterene-hydrochlorothiazide (MAXZIDE-25) 37.5-25 MG per tablet Take 1 tablet by mouth 3 (three) times a week.  . verapamil (CALAN) 80 MG tablet TAKE ONE TABLET BY MOUTH FOUR TIMES DAILY AFTER MEALS AND AT BEDTIME   No facility-administered encounter medications on file as of 07/09/2014.    Objective:   Physical Exam  Constitutional: She is oriented to person, place, and time. She appears well-developed and well-nourished.  Cardiovascular: Normal rate.   I hear a new systolic murmur over the aortic area that has not been described previously. This does not sound hemodynamically significant but I wonder about its occurrence as it might relate to fluid retention  Neurological: She is alert and oriented to person, place, and time.          Assessment & Plan:  1. Urinary frequency Urinalysis pending. Edema is estimated at only 1+. I have asked her to elevate her legs and will keep diarrhetic 3 days a week for now. - POCT UA - Microscopic Only - POCT urinalysis dipstick  2. Newly recognized murmur Question significance. Patient requests seeing the cardiologist in this area rather than continued drive to [facility] MD - Ambulatory referral to Cardiology

## 2014-07-10 ENCOUNTER — Other Ambulatory Visit: Payer: Self-pay | Admitting: *Deleted

## 2014-07-10 MED ORDER — PANTOPRAZOLE SODIUM 40 MG PO TBEC: 40.0000 mg | DELAYED_RELEASE_TABLET | Freq: Two times a day (BID) | ORAL | Status: DC

## 2014-07-13 ENCOUNTER — Encounter: Payer: Self-pay | Admitting: Physical Therapy

## 2014-07-13 ENCOUNTER — Ambulatory Visit: Payer: Medicare Other | Admitting: Physical Therapy

## 2014-07-13 DIAGNOSIS — R531 Weakness: Secondary | ICD-10-CM

## 2014-07-13 DIAGNOSIS — R262 Difficulty in walking, not elsewhere classified: Secondary | ICD-10-CM

## 2014-07-13 DIAGNOSIS — G959 Disease of spinal cord, unspecified: Secondary | ICD-10-CM | POA: Diagnosis not present

## 2014-07-13 DIAGNOSIS — R29818 Other symptoms and signs involving the nervous system: Secondary | ICD-10-CM | POA: Diagnosis not present

## 2014-07-13 DIAGNOSIS — R2689 Other abnormalities of gait and mobility: Secondary | ICD-10-CM

## 2014-07-13 DIAGNOSIS — R29898 Other symptoms and signs involving the musculoskeletal system: Secondary | ICD-10-CM | POA: Diagnosis not present

## 2014-07-13 DIAGNOSIS — M25561 Pain in right knee: Secondary | ICD-10-CM

## 2014-07-13 DIAGNOSIS — M25671 Stiffness of right ankle, not elsewhere classified: Secondary | ICD-10-CM | POA: Diagnosis not present

## 2014-07-13 NOTE — Therapy (Signed)
[facility] [place], [place], [address] Phone: [phone]   Fax:  [phone]  Physical Therapy Treatment  Patient Details  Name: Amy Black MRN: [id number] Date of Birth: 22-Sep-1940 Referring Provider:  Wardell Honour, MD  Encounter Date: 07/13/2014      PT End of Session - 07/13/14 1305    Visit Number 2   Number of Visits 12   Date for PT Re-Evaluation 08/19/14   PT Start Time 6767   PT Stop Time 1343   PT Time Calculation (min) 40 min   Equipment Utilized During Treatment Other (comment)  SPC   Activity Tolerance Patient tolerated treatment well   Behavior During Therapy [facility] for tasks assessed/performed      Past Medical History  Diagnosis Date  . CAD (coronary artery disease)   . Irritable bowel syndrome   . Diverticulosis of colon (without mention of hemorrhage)   . hepatic cyst   . Hyperlipidemia   . Adenosquamous carcinoma     left leg 2004 - radiation & resection  . HTN (hypertension)     PT. DENIES  . History of cardiac catheterization   . Unstable angina   . Adenocarcinoma     LEFT LEG  . Insomnia   . Vitamin D deficiency   . Adenocarcinoma of unknown primary 02/06/2011  . History of nuclear stress test 04/2011    lexiscan; normal study, no significant ischemia, low risk   . Coronary artery spasm     Past Surgical History  Procedure Laterality Date  . Resection soft tissue tumor leg / ankle radical  2004    leg lesion resection & radiation  . Diagnostic laparoscopy    . Lysis of adhesion    . Salpingoophorectomy Left   . Abdominal hysterectomy    . Pelvic laparoscopy  1992    RSO, AND LSO ON 2007  . Cardiac catheterization      coronary spasm  . Transthoracic echocardiogram  07/2012    LV cavity size mildly reduced, normal wall motion; MV with calcified annulus and mild MR; LA mildly dilated; atrial septum with increased thickness - lipomatous hypertrophy; RV systolic pressure  increased (borderline pulm HTN)    There were no vitals filed for this visit.  Visit Diagnosis:  Loss of balance  Weakness  Difficulty walking  Right leg weakness  Ankle stiffness, right  Myelopathy  Pain in joint, lower leg, right      Subjective Assessment - 07/13/14 1304    Subjective "I feel good if I could just get this hamstring and ankle working."   Currently in Pain? No/denies            [facility] PT Assessment - 07/13/14 0001    Assessment   Medical Diagnosis Balance disorder.   Onset Date/Surgical Date 01/25/13                     [facility] Adult PT Treatment/Exercise - 07/13/14 0001    Exercises   Exercises Knee/Hip;Ankle   Lumbar Exercises: Stretches   Active Hamstring Stretch 3 reps;30 seconds   Lumbar Exercises: Aerobic   Stationary Bike --   Knee/Hip Exercises: Stretches   Active Hamstring Stretch 3 reps;30 seconds   Ankle Exercises: Aerobic   Stationary Bike NuStep L3 x11 min   Ankle Exercises: Standing   Rocker Board 3 minutes   Ankle Exercises: Seated   Ankle Circles/Pumps AROM;Right;Other reps (comment)  x30 reps   Other Seated  Ankle Exercises 4D ankle theraband strengthening (yellow) x30 reps each             Balance Exercises - 07/13/14 1347    Balance Exercises: Standing   SLS with Vectors Solid surface;Intermittent upper extremity assist;3 reps;15 secs;Other (comment)  Bilaterally   Rockerboard Anterior/posterior;Intermittent UE support  x2 min   Marching Limitations x3 min   Other Standing Exercises Toe taps on 2" step x30 reps           PT Education - 07/13/14 1351    Education provided Yes   Education Details HEP- 4D ankle strengthening, SLS, HS stretch, marching   Person(s) Educated Patient   Methods Explanation;Demonstration;Verbal cues;Handout   Comprehension Verbalized understanding;Returned demonstration;Verbal cues required          PT Short Term Goals - 07/08/14 1319    PT SHORT TERM GOAL #1    Title In with HEP.   Time 2   Period Weeks   PT SHORT TERM GOAL #4   Title Improve Berg score to 54/56.   Time 6   Period Weeks   Status New           PT Long Term Goals - 07/08/14 1321    PT LONG TERM GOAL #2   Title Improve Berg score to 54/56.   Time 6   Period Weeks   Status New               Plan - 07/13/14 1352    Clinical Impression Statement Patient tolerated treatment well with only fatigue expressed during treatment. Accepted and welcomed new HEP exercises. Continues to ambulate with SPC into treatment. Goals remain on-going at this time. Denied pain following treamtent.   Pt will benefit from skilled therapeutic intervention in order to improve on the following deficits Decreased strength   PT Frequency 2x / week   PT Duration 6 weeks   PT Treatment/Interventions Patient/family education;Therapeutic exercise;Therapeutic activities;Balance training   PT Next Visit Plan Continue per PT POC.   Consulted and Agree with Plan of Care Patient        Problem List Patient Active Problem List   Diagnosis Date Noted  . Baker's cyst of knee 02/14/2013  . Coronary artery spasm 11/19/2012  . Transverse myelitis 10/10/2012  . HTN (hypertension) 08/29/2012  . Arthritis of neck 08/29/2012  . Adenocarcinoma of unknown primary 02/06/2011  . Chest pain 08/16/2010  . Anxiety 05/19/2010  . CAD 12/03/2009  . GERD 12/03/2009  . ANGINA, UNSTABLE 12/02/2009    Wynelle Fanny, PTA 07/13/2014, 1:54 PM  [facility] [address] [place], [place], [address] Phone: [phone]   Fax:  [phone]

## 2014-07-13 NOTE — Patient Instructions (Signed)
Dorsiflexion: Resisted   Facing anchor, tubing around left foot, pull toward face.  Repeat _10___ times per set. Do _3___ sets per session. Do _2___ sessions per day.  http://orth.exer.us/8   Copyright  VHI. All rights reserved.  Plantar Flexion: Resisted   Anchor behind, tubing around left foot, press down. Repeat __10__ times per set. Do __3__ sets per session. Do __2__ sessions per day.  http://orth.exer.us/10   Copyright  VHI. All rights reserved.  Inversion: Resisted   Cross legs with right leg underneath, foot in tubing loop. Hold tubing around other foot to resist and turn foot in. Repeat __10__ times per set. Do __3__ sets per session. Do __2__ sessions per day.  http://orth.exer.us/12   Copyright  VHI. All rights reserved.  Eversion: Resisted   With right foot in tubing loop, hold tubing around other foot to resist and turn foot out. Repeat _10___ times per set. Do __3__ sets per session. Do _2___ sessions per day.  http://orth.exer.us/14   Copyright  VHI. All rights reserved.  Ankle Circles   Slowly rotate right foot and ankle clockwise then counterclockwise. Gradually increase range of motion. Avoid pain. Circle __30__ times each direction per set. Do ____ sets per session. Do _2___ sessions per day.  http://orth.exer.us/30   Copyright  VHI. All rights reserved.  Stretching: Hamstring (Standing)   Place right foot on stool. Slowly lean forward, keeping back straight, until stretch is felt in back of thigh. Hold _30___ seconds. Repeat _3___ times per set. Do _1___ sets per session. Do __2__ sessions per day.  http://orth.exer.us/658   Copyright  VHI. All rights reserved.  Balance: Unilateral   Attempt to balance on left leg, eyes open. Hold __30__ seconds. Repeat _10___ times per set. Do ____ sets per session. Do __2__ sessions per day.   http://orth.exer.us/28   Copyright  VHI. All rights reserved.  Hip Flexion (Standing)   March  like a soldier stranding by a countertop. Repeat _10___ times per set. Do _3___ sets per session. Do _2___ sessions per day.  http://orth.exer.us/700   Copyright  VHI. All rights reserved.

## 2014-07-14 ENCOUNTER — Other Ambulatory Visit: Payer: Self-pay | Admitting: Family Medicine

## 2014-07-14 NOTE — Telephone Encounter (Signed)
Last seen 07/09/14 Dr Sabra Heck if approved route to nurse to call into [facility]  [phone]

## 2014-07-15 ENCOUNTER — Encounter: Payer: Self-pay | Admitting: Physical Therapy

## 2014-07-15 ENCOUNTER — Ambulatory Visit: Payer: Medicare Other | Admitting: Physical Therapy

## 2014-07-15 DIAGNOSIS — M25671 Stiffness of right ankle, not elsewhere classified: Secondary | ICD-10-CM

## 2014-07-15 DIAGNOSIS — R29898 Other symptoms and signs involving the musculoskeletal system: Secondary | ICD-10-CM

## 2014-07-15 DIAGNOSIS — R262 Difficulty in walking, not elsewhere classified: Secondary | ICD-10-CM | POA: Diagnosis not present

## 2014-07-15 DIAGNOSIS — R29818 Other symptoms and signs involving the nervous system: Secondary | ICD-10-CM | POA: Diagnosis not present

## 2014-07-15 DIAGNOSIS — R531 Weakness: Secondary | ICD-10-CM

## 2014-07-15 DIAGNOSIS — G959 Disease of spinal cord, unspecified: Secondary | ICD-10-CM

## 2014-07-15 DIAGNOSIS — R2689 Other abnormalities of gait and mobility: Secondary | ICD-10-CM

## 2014-07-15 DIAGNOSIS — M25561 Pain in right knee: Secondary | ICD-10-CM | POA: Diagnosis not present

## 2014-07-15 NOTE — Therapy (Signed)
[facility] [place], [place], [address] Phone: [phone]   Fax:  [phone]  Physical Therapy Treatment  Patient Details  Name: Amy Black MRN: [id number] Date of Birth: 1940/10/29 Referring Provider:  Wardell Honour, MD  Encounter Date: 07/15/2014      PT End of Session - 07/15/14 1303    Visit Number 3   Number of Visits 12   Date for PT Re-Evaluation 08/19/14   PT Start Time 1300   PT Stop Time 1345   PT Time Calculation (min) 45 min   Equipment Utilized During Treatment Other (comment)   Activity Tolerance Patient tolerated treatment well   Behavior During Therapy [facility] for tasks assessed/performed      Past Medical History  Diagnosis Date  . CAD (coronary artery disease)   . Irritable bowel syndrome   . Diverticulosis of colon (without mention of hemorrhage)   . hepatic cyst   . Hyperlipidemia   . Adenosquamous carcinoma     left leg 2004 - radiation & resection  . HTN (hypertension)     PT. DENIES  . History of cardiac catheterization   . Unstable angina   . Adenocarcinoma     LEFT LEG  . Insomnia   . Vitamin D deficiency   . Adenocarcinoma of unknown primary 02/06/2011  . History of nuclear stress test 04/2011    lexiscan; normal study, no significant ischemia, low risk   . Coronary artery spasm     Past Surgical History  Procedure Laterality Date  . Resection soft tissue tumor leg / ankle radical  2004    leg lesion resection & radiation  . Diagnostic laparoscopy    . Lysis of adhesion    . Salpingoophorectomy Left   . Abdominal hysterectomy    . Pelvic laparoscopy  1992    RSO, AND LSO ON 2007  . Cardiac catheterization      coronary spasm  . Transthoracic echocardiogram  07/2012    LV cavity size mildly reduced, normal wall motion; MV with calcified annulus and mild MR; LA mildly dilated; atrial septum with increased thickness - lipomatous hypertrophy; RV systolic pressure  increased (borderline pulm HTN)    There were no vitals filed for this visit.  Visit Diagnosis:  Loss of balance  Weakness  Difficulty walking  Right leg weakness  Ankle stiffness, right  Myelopathy  Pain in joint, lower leg, right      Subjective Assessment - 07/15/14 1302    Subjective Reports everything still feels tight  today. Denied pain only a headache earlier. Reports HEP compliance and states that she has some trouble with her RLE getting out of the car.   Patient Stated Goals Improve my balance and hamstring strength.   Currently in Pain? No/denies            [facility] PT Assessment - 07/15/14 0001    Assessment   Medical Diagnosis Balance disorder.   Onset Date/Surgical Date 01/25/13                     [facility] Adult PT Treatment/Exercise - 07/15/14 0001    Knee/Hip Exercises: Stretches   Active Hamstring Stretch 3 reps;30 seconds   Knee/Hip Exercises: Aerobic   Stationary Bike NuStep L4 x8 min   Knee/Hip Exercises: Machines for Strengthening   Cybex Knee Flexion 20# 3 sets 10 reps   Ankle Exercises: Stretches   Other Psychiatrist RLE x74min   Ankle Exercises:  Standing   Rocker Board 3 minutes   Ankle Exercises: Seated   Ankle Circles/Pumps AROM;Right;Other reps (comment)  x30 reps   Other Seated Ankle Exercises 4D ankle isolator 1# x30 reps each             Balance Exercises - 07/15/14 1324    Balance Exercises: Standing   Tandem Stance Eyes open;Foam/compliant surface;Intermittent upper extremity support;30 secs;Limitations;3 reps   SLS with Vectors Foam/compliant surface;Intermittent upper extremity assist;3 reps;30 secs  Bilateral   Rockerboard Anterior/posterior;Intermittent UE support  x3 min   Marching Limitations x3 min on Airex   Other Standing Exercises Toe taps 6" step x30 reps; Rockerboard x3 min for balance with intermittant UE support             PT Short Term Goals - 07/15/14 1304    PT SHORT TERM GOAL  #1   Title In with HEP.   Time 2   Period Weeks   Status Achieved   PT SHORT TERM GOAL #2   Title Pt pain to be no greater than a 5/10 80% of the time   Time 4   Period Weeks   Status Achieved   PT SHORT TERM GOAL #3   Title Pt ankle dorsiflexion to improve to -5 to allow a more normalize gait   Time 4   Period Weeks   Status Achieved   PT SHORT TERM GOAL #4   Title Improve Berg score to 54/56.   Time 6   Period Weeks   Status On-going           PT Long Term Goals - 07/15/14 1304    PT LONG TERM GOAL #1   Title Pt right ankle dorsiflexion/eversion and right hamstring to improve to 4+/5 to allow a more normalize gait   Time 6   Period Weeks   Status On-going   PT LONG TERM GOAL #2   Title Improve Berg score to 54/56.   Time 6   Period Weeks   Status On-going   PT LONG TERM GOAL #3   Title Pt ankle ROM to be improved to 5 degrees to allow for a normalize gait    Time 6   Period Weeks   Status On-going   PT LONG TERM GOAL #4   Title Pt to be able to SLS x 5 seconds on Rt for decreased risk of falling   Time 6   Period Weeks   Status On-going   PT LONG TERM GOAL #5   Title Pt to feel comfortable walking in her home without an assistive device    Time 6   Period Weeks   Status On-going               Plan - 07/15/14 1350    Clinical Impression Statement Patient tolerated treatment well and was optimistic regarding exercises. Ambulated into therapy gym with [facility] today. Goals remain on-going at this time due to decreased strength and impaired balance.Required minimal verbal cueing for correct technique of exercises. Demonstrates decreased R ankle dorsiflexion, R knee flexion, and R hip flexion throughout treatment. Denied pain following treatment.    Pt will benefit from skilled therapeutic intervention in order to improve on the following deficits Decreased strength   PT Frequency 2x / week   PT Duration 6 weeks   PT Treatment/Interventions Patient/family  education;Therapeutic exercise;Therapeutic activities;Balance training   PT Next Visit Plan Continue per MPT POC.   PT Home Exercise Plan given quad stretch  and prone knee flexion.   Consulted and Agree with Plan of Care Patient        Problem List Patient Active Problem List   Diagnosis Date Noted  . Baker's cyst of knee 02/14/2013  . Coronary artery spasm 11/19/2012  . Transverse myelitis 10/10/2012  . HTN (hypertension) 08/29/2012  . Arthritis of neck 08/29/2012  . Adenocarcinoma of unknown primary 02/06/2011  . Chest pain 08/16/2010  . Anxiety 05/19/2010  . CAD 12/03/2009  . GERD 12/03/2009  . ANGINA, UNSTABLE 12/02/2009    Wynelle Fanny, PTA 07/15/2014, 1:57 PM  [facility] [address] [place], [place], [address] Phone: [phone]   Fax:  [phone]

## 2014-07-16 NOTE — Telephone Encounter (Signed)
Called to [employer]

## 2014-07-20 ENCOUNTER — Encounter: Payer: Self-pay | Admitting: Hematology & Oncology

## 2014-07-20 ENCOUNTER — Ambulatory Visit (HOSPITAL_BASED_OUTPATIENT_CLINIC_OR_DEPARTMENT_OTHER): Payer: Medicare Other | Admitting: Hematology & Oncology

## 2014-07-20 ENCOUNTER — Other Ambulatory Visit (HOSPITAL_BASED_OUTPATIENT_CLINIC_OR_DEPARTMENT_OTHER): Payer: Medicare Other

## 2014-07-20 VITALS — BP 135/74 | HR 57 | Temp 97.4°F | Resp 16 | Wt 160.0 lb

## 2014-07-20 DIAGNOSIS — C801 Malignant (primary) neoplasm, unspecified: Secondary | ICD-10-CM

## 2014-07-20 DIAGNOSIS — D509 Iron deficiency anemia, unspecified: Secondary | ICD-10-CM

## 2014-07-20 DIAGNOSIS — D75839 Thrombocytosis, unspecified: Secondary | ICD-10-CM

## 2014-07-20 DIAGNOSIS — D473 Essential (hemorrhagic) thrombocythemia: Secondary | ICD-10-CM | POA: Diagnosis not present

## 2014-07-20 LAB — CMP (CANCER CENTER ONLY)
ALT(SGPT): 19 U/L (ref 10–47)
AST: 23 U/L (ref 11–38)
Albumin: 3.7 g/dL (ref 3.3–5.5)
Alkaline Phosphatase: 97 U/L — ABNORMAL HIGH (ref 26–84)
BUN, Bld: 10 mg/dL (ref 7–22)
CO2: 32 mEq/L (ref 18–33)
Calcium: 9.8 mg/dL (ref 8.0–10.3)
Chloride: 101 mEq/L (ref 98–108)
Creat: 0.9 mg/dl (ref 0.6–1.2)
Glucose, Bld: 87 mg/dL (ref 73–118)
Potassium: 4.6 mEq/L (ref 3.3–4.7)
Sodium: 140 mEq/L (ref 128–145)
Total Bilirubin: 0.5 mg/dl (ref 0.20–1.60)
Total Protein: 7.5 g/dL (ref 6.4–8.1)

## 2014-07-20 LAB — CBC WITH DIFFERENTIAL (CANCER CENTER ONLY)
BASO#: 0 10*3/uL (ref 0.0–0.2)
BASO%: 0.6 % (ref 0.0–2.0)
EOS%: 7.4 % — ABNORMAL HIGH (ref 0.0–7.0)
Eosinophils Absolute: 0.5 10*3/uL (ref 0.0–0.5)
HCT: 37 % (ref 34.8–46.6)
HGB: 11.9 g/dL (ref 11.6–15.9)
LYMPH#: 2.5 10*3/uL (ref 0.9–3.3)
LYMPH%: 39.5 % (ref 14.0–48.0)
MCH: 31.2 pg (ref 26.0–34.0)
MCHC: 32.2 g/dL (ref 32.0–36.0)
MCV: 97 fL (ref 81–101)
MONO#: 0.5 10*3/uL (ref 0.1–0.9)
MONO%: 7.7 % (ref 0.0–13.0)
NEUT#: 2.8 10*3/uL (ref 1.5–6.5)
NEUT%: 44.8 % (ref 39.6–80.0)
Platelets: 416 10*3/uL — ABNORMAL HIGH (ref 145–400)
RBC: 3.81 10*6/uL (ref 3.70–5.32)
RDW: 13.1 % (ref 11.1–15.7)
WBC: 6.2 10*3/uL (ref 3.9–10.0)

## 2014-07-20 LAB — LACTATE DEHYDROGENASE: LDH: 132 U/L (ref 94–250)

## 2014-07-20 NOTE — Progress Notes (Signed)
Hematology and Oncology Follow Up Visit  Amy Black [id number] 07/24/1940 74 y.o. 07/20/2014   Principle Diagnosis:   Transient thrombocytosis  History transverse myelitis  History of adenosquamous carcinoma of unknown primary  Current Therapy:    Observation     Interim History:  Amy Black is back for followup. We saw her back in December. She no problems over the winter. She had no problems with infections. She had no flu or pneumonia.  She's had no cough or shortness of breath. She's had no abdominal pain. There's been no change in bowel or bladder habits.  Her right leg is improving. She had that myelitis. She is doing physical therapy twice a day.  She's not noted any rashes. She's had no leg swelling.  She denies any, pain.  Medications:  Current outpatient prescriptions:  .  ALPRAZolam (XANAX) 0.5 MG tablet, TAKE ONE TABLET BY MOUTH THREE TIMES DAILY, Disp: 90 tablet, Rfl: 1 .  aspirin 81 MG tablet, Take 1 tablet (81 mg total) by mouth daily., Disp: 30 tablet, Rfl:  .  cetirizine (ZYRTEC) 10 MG tablet, Take 1 tablet (10 mg total) by mouth 2 (two) times daily., Disp: 30 tablet, Rfl: 0 .  Cholecalciferol (VITAMIN D PO), Take by mouth. OTC, Disp: , Rfl:  .  cyclobenzaprine (FLEXERIL) 5 MG tablet, Take 1 tablet (5 mg total) by mouth 3 (three) times daily as needed for muscle spasms., Disp: 30 tablet, Rfl: 1 .  loperamide (IMODIUM) 2 MG capsule, TAKE ONE CAPSULE BY MOUTH FOUR TIMES DAILY AS NEEDED FOR DIARRHEA / LOOSE STOOLS, Disp: 120 capsule, Rfl: 2 .  meloxicam (MOBIC) 15 MG tablet, TAKE ONE TABLET BY MOUTH ONCE DAILY, Disp: 30 tablet, Rfl: 2 .  nitroGLYCERIN (NITROSTAT) 0.4 MG SL tablet, Place 1 tablet (0.4 mg total) under the tongue every 5 (five) minutes as needed. For chest pain, Disp: 25 tablet, Rfl: 3 .  pantoprazole (PROTONIX) 40 MG tablet, Take 1 tablet (40 mg total) by mouth 2 (two) times daily., Disp: 180 tablet, Rfl: 1 .  traMADol (ULTRAM) 50 MG tablet,  Take 1 tablet (50 mg total) by mouth every 12 (twelve) hours as needed., Disp: 30 tablet, Rfl: 3 .  triamterene-hydrochlorothiazide (MAXZIDE-25) 37.5-25 MG per tablet, Take 1 tablet by mouth 3 (three) times a week., Disp: 30 tablet, Rfl: 1 .  verapamil (CALAN) 80 MG tablet, TAKE ONE TABLET BY MOUTH FOUR TIMES DAILY AFTER MEALS AND AT BEDTIME, Disp: 360 tablet, Rfl: 2  Allergies:  Allergies  Allergen Reactions  . Cymbalta [Duloxetine Hcl]     sleepiness  . Gabapentin     sleepiness  . Iodine Itching    Itching   . Ivp Dye [Iodinated Diagnostic Agents] Itching  . Zanaflex [Tizanidine Hcl]     Very drunk feeling next day    Past Medical History, Surgical history, Social history, and Family History were reviewed and updated.  Review of Systems: As above  Physical Exam:  weight is 160 lb (72.576 kg). Her oral temperature is 97.4 F (36.3 C). Her blood pressure is 135/74 and her pulse is 57. Her respiration is 16.   [name], [name]. Head and neck exam shows no ocular or oral lesions. She has no adenopathy in the neck. Her lungs are clear. Cardiac exam regular rate and rhythm with no murmurs, rubs or bruits.. Abdomen is soft. She has good bowel sounds. There is no fluid wave. There is no palpable liver or spleen tip. Back exam  shows no tenderness over the spine. There's some slight tenderness over the right sacroiliac region. No swelling is noted. Extremities shows no clubbing cyanosis or edema. Has good muscle strength bilaterally. Skin exam no rashes.  Neurological exam is nonfocal.  Lab Results  Component Value Date   WBC 6.2 07/20/2014   HGB 11.9 07/20/2014   HCT 37.0 07/20/2014   MCV 97 07/20/2014   PLT 416* 07/20/2014     Chemistry      Component Value Date/Time   NA 139 01/23/2014 1148   NA 141 09/10/2013 1659   NA 139 04/14/2010 1349   K 4.2 01/23/2014 1148   K 5.0* 04/14/2010 1349   CL 101 01/23/2014 1148   CL 97* 04/14/2010 1349   CO2 31  01/23/2014 1148   CO2 32 04/14/2010 1349   BUN 8 01/23/2014 1148   BUN 14 09/10/2013 1659   BUN 10 04/14/2010 1349   CREATININE 0.84 01/23/2014 1148   CREATININE 1.25* 08/29/2012 1316      Component Value Date/Time   CALCIUM 10.0 01/23/2014 1148   CALCIUM 9.5 04/14/2010 1349   ALKPHOS 91 01/23/2014 1148   ALKPHOS 84 04/14/2010 1349   AST 13 01/23/2014 1148   AST 20 04/14/2010 1349   ALT <8 01/23/2014 1148   ALT 15 04/14/2010 1349   BILITOT 0.6 01/23/2014 1148   BILITOT 0.60 04/14/2010 1349         Impression and Plan: Ms. Badilla is a 74 year old African American female. She is doing okay from my point of view. Again there is no evidence of recurrent malignancy. Her blood counts have a pretty much normalized. The thrombocytosis certainly could be reactive.  For now, we will plan to get her back in 6 months time. I do not see that any additional x-ray studies that need to be done on her.  Volanda Napoleon, MD 6/13/201612:48 PM

## 2014-07-21 ENCOUNTER — Ambulatory Visit: Payer: Medicare Other | Admitting: Physical Therapy

## 2014-07-21 ENCOUNTER — Encounter: Payer: Self-pay | Admitting: Physical Therapy

## 2014-07-21 DIAGNOSIS — R531 Weakness: Secondary | ICD-10-CM

## 2014-07-21 DIAGNOSIS — R2689 Other abnormalities of gait and mobility: Secondary | ICD-10-CM

## 2014-07-21 DIAGNOSIS — M25561 Pain in right knee: Secondary | ICD-10-CM | POA: Diagnosis not present

## 2014-07-21 DIAGNOSIS — M25671 Stiffness of right ankle, not elsewhere classified: Secondary | ICD-10-CM | POA: Diagnosis not present

## 2014-07-21 DIAGNOSIS — G959 Disease of spinal cord, unspecified: Secondary | ICD-10-CM | POA: Diagnosis not present

## 2014-07-21 DIAGNOSIS — R262 Difficulty in walking, not elsewhere classified: Secondary | ICD-10-CM

## 2014-07-21 DIAGNOSIS — R29818 Other symptoms and signs involving the nervous system: Secondary | ICD-10-CM | POA: Diagnosis not present

## 2014-07-21 DIAGNOSIS — R29898 Other symptoms and signs involving the musculoskeletal system: Secondary | ICD-10-CM | POA: Diagnosis not present

## 2014-07-21 NOTE — Therapy (Signed)
[facility] [place], [place], [address] Phone: [phone]   Fax:  [phone]  Physical Therapy Treatment  Patient Details  Name: Amy Black MRN: [id number] Date of Birth: 12/02/1940 Referring Provider:  Wardell Honour, MD  Encounter Date: 07/21/2014      PT End of Session - 07/21/14 1303    Visit Number 4   Number of Visits 12   Date for PT Re-Evaluation 08/19/14   PT Start Time 1300   PT Stop Time 1344   PT Time Calculation (min) 44 min   Equipment Utilized During Treatment Other (comment)  SPC   Activity Tolerance Patient tolerated treatment well   Behavior During Therapy [facility] for tasks assessed/performed      Past Medical History  Diagnosis Date  . CAD (coronary artery disease)   . Irritable bowel syndrome   . Diverticulosis of colon (without mention of hemorrhage)   . hepatic cyst   . Hyperlipidemia   . Adenosquamous carcinoma     left leg 2004 - radiation & resection  . HTN (hypertension)     PT. DENIES  . History of cardiac catheterization   . Unstable angina   . Adenocarcinoma     LEFT LEG  . Insomnia   . Vitamin D deficiency   . Adenocarcinoma of unknown primary 02/06/2011  . History of nuclear stress test 04/2011    lexiscan; normal study, no significant ischemia, low risk   . Coronary artery spasm     Past Surgical History  Procedure Laterality Date  . Resection soft tissue tumor leg / ankle radical  2004    leg lesion resection & radiation  . Diagnostic laparoscopy    . Lysis of adhesion    . Salpingoophorectomy Left   . Abdominal hysterectomy    . Pelvic laparoscopy  1992    RSO, AND LSO ON 2007  . Cardiac catheterization      coronary spasm  . Transthoracic echocardiogram  07/2012    LV cavity size mildly reduced, normal wall motion; MV with calcified annulus and mild MR; LA mildly dilated; atrial septum with increased thickness - lipomatous hypertrophy; RV systolic pressure  increased (borderline pulm HTN)    There were no vitals filed for this visit.  Visit Diagnosis:  Loss of balance  Weakness  Difficulty walking  Right leg weakness  Ankle stiffness, right  Myelopathy  Pain in joint, lower leg, right      Subjective Assessment - 07/21/14 1302    Subjective States that she's "feeling good." Only reports low back tingling sensation today.   Patient Stated Goals Improve my balance and hamstring strength.   Currently in Pain? Yes   Pain Score 5    Pain Location Back   Pain Orientation Left;Lower   Pain Descriptors / Indicators Tingling            [facility] PT Assessment - 07/21/14 0001    Assessment   Medical Diagnosis Balance disorder.   Onset Date/Surgical Date 01/25/13                     [facility] Adult PT Treatment/Exercise - 07/21/14 0001    Knee/Hip Exercises: Stretches   Active Hamstring Stretch 3 reps;30 seconds   Knee/Hip Exercises: Machines for Strengthening   Cybex Knee Extension 10# 3 sets 10 reps   Cybex Knee Flexion 20# 3 sets 10 reps   Knee/Hip Exercises: Standing   Forward Step Up Right;3 sets;10 reps;Step  Height: 4";Other (comment)  Completed backwards   Ankle Exercises: Stretches   Other Stretch Prostretch RLE x24min   Ankle Exercises: Seated   Ankle Circles/Pumps Strengthening;Right;Other reps (comment)  x30 reps with ankle isolator 1#   Other Seated Ankle Exercises 4D ankle isolator 1# x30 reps each   Ankle Exercises: Standing   Rocker Board 2 minutes             Balance Exercises - 07/21/14 1337    Balance Exercises: Standing   Tandem Stance Eyes open;Foam/compliant surface;Intermittent upper extremity support;30 secs;Limitations;3 reps   SLS with Vectors Foam/compliant surface;Intermittent upper extremity assist;3 reps;30 secs;Other (comment)  Bilaterally   Rockerboard Anterior/posterior;Intermittent UE support  x3 min   Marching Limitations x2 min on airex   Other Standing Exercises BLE  standing on BOSU with weight transfer L/R x3 min with intermittant UE support             PT Short Term Goals - 07/15/14 1304    PT SHORT TERM GOAL #1   Title In with HEP.   Time 2   Period Weeks   Status Achieved   PT SHORT TERM GOAL #2   Title Pt pain to be no greater than a 5/10 80% of the time   Time 4   Period Weeks   Status Achieved   PT SHORT TERM GOAL #3   Title Pt ankle dorsiflexion to improve to -5 to allow a more normalize gait   Time 4   Period Weeks   Status Achieved   PT SHORT TERM GOAL #4   Title Improve Berg score to 54/56.   Time 6   Period Weeks   Status On-going           PT Long Term Goals - 07/15/14 1304    PT LONG TERM GOAL #1   Title Pt right ankle dorsiflexion/eversion and right hamstring to improve to 4+/5 to allow a more normalize gait   Time 6   Period Weeks   Status On-going   PT LONG TERM GOAL #2   Title Improve Berg score to 54/56.   Time 6   Period Weeks   Status On-going   PT LONG TERM GOAL #3   Title Pt ankle ROM to be improved to 5 degrees to allow for a normalize gait    Time 6   Period Weeks   Status On-going   PT LONG TERM GOAL #4   Title Pt to be able to SLS x 5 seconds on Rt for decreased risk of falling   Time 6   Period Weeks   Status On-going   PT LONG TERM GOAL #5   Title Pt to feel comfortable walking in her home without an assistive device    Time 6   Period Weeks   Status On-going               Plan - 07/21/14 1345    Clinical Impression Statement Patient continues to tolerate treatment well with strengthening and balance. Demonstrates R hip hike during marching to elevate RLE. Goals remain on-going at this time due to decreased ROM, strength and impaired balance. Required moderate verbal cueing for technique for backward step up as well as minimal verbal cueing and demosntration for other exercises. Denied pain only fatigue following treatment.   Pt will benefit from skilled therapeutic  intervention in order to improve on the following deficits Decreased strength   PT Frequency 2x / week   PT Duration  6 weeks   PT Treatment/Interventions Patient/family education;Therapeutic exercise;Therapeutic activities;Balance training   PT Next Visit Plan Continue per MPT POC.   PT Home Exercise Plan given quad stretch and prone knee flexion.   Consulted and Agree with Plan of Care Patient        Problem List Patient Active Problem List   Diagnosis Date Noted  . Baker's cyst of knee 02/14/2013  . Coronary artery spasm 11/19/2012  . Transverse myelitis 10/10/2012  . HTN (hypertension) 08/29/2012  . Arthritis of neck 08/29/2012  . Adenocarcinoma of unknown primary 02/06/2011  . Chest pain 08/16/2010  . Anxiety 05/19/2010  . CAD 12/03/2009  . GERD 12/03/2009  . ANGINA, UNSTABLE 12/02/2009    Wynelle Fanny, PTA 07/21/2014, 1:48 PM  [facility] [address] [place], [place], [address] Phone: [phone]   Fax:  [phone]

## 2014-07-22 ENCOUNTER — Encounter: Payer: Self-pay | Admitting: Physical Therapy

## 2014-07-22 ENCOUNTER — Ambulatory Visit: Payer: Medicare Other | Admitting: Physical Therapy

## 2014-07-22 DIAGNOSIS — R29898 Other symptoms and signs involving the musculoskeletal system: Secondary | ICD-10-CM | POA: Diagnosis not present

## 2014-07-22 DIAGNOSIS — M25561 Pain in right knee: Secondary | ICD-10-CM | POA: Diagnosis not present

## 2014-07-22 DIAGNOSIS — M25671 Stiffness of right ankle, not elsewhere classified: Secondary | ICD-10-CM | POA: Diagnosis not present

## 2014-07-22 DIAGNOSIS — R531 Weakness: Secondary | ICD-10-CM

## 2014-07-22 DIAGNOSIS — R29818 Other symptoms and signs involving the nervous system: Secondary | ICD-10-CM | POA: Diagnosis not present

## 2014-07-22 DIAGNOSIS — R262 Difficulty in walking, not elsewhere classified: Secondary | ICD-10-CM | POA: Diagnosis not present

## 2014-07-22 DIAGNOSIS — R2689 Other abnormalities of gait and mobility: Secondary | ICD-10-CM

## 2014-07-22 DIAGNOSIS — G959 Disease of spinal cord, unspecified: Secondary | ICD-10-CM

## 2014-07-22 NOTE — Therapy (Signed)
[name], [place], [address] Phone: [phone]   Fax:  [phone]  Physical Therapy Treatment  Patient Details  Name: Amy Black MRN: [id number] Date of Birth: 1940-02-16 Referring Provider:  Wardell Honour, MD  Encounter Date: 07/22/2014      PT End of Session - 07/22/14 1302    Visit Number 5   Number of Visits 12   Date for PT Re-Evaluation 08/19/14   PT Start Time 1302   PT Stop Time 1345   PT Time Calculation (min) 43 min   Equipment Utilized During Treatment Other (comment)  SPC   Activity Tolerance Patient tolerated treatment well   Behavior During Therapy [facility] for tasks assessed/performed      Past Medical History  Diagnosis Date  . CAD (coronary artery disease)   . Irritable bowel syndrome   . Diverticulosis of colon (without mention of hemorrhage)   . hepatic cyst   . Hyperlipidemia   . Adenosquamous carcinoma     left leg 2004 - radiation & resection  . HTN (hypertension)     PT. DENIES  . History of cardiac catheterization   . Unstable angina   . Adenocarcinoma     LEFT LEG  . Insomnia   . Vitamin D deficiency   . Adenocarcinoma of unknown primary 02/06/2011  . History of nuclear stress test 04/2011    lexiscan; normal study, no significant ischemia, low risk   . Coronary artery spasm     Past Surgical History  Procedure Laterality Date  . Resection soft tissue tumor leg / ankle radical  2004    leg lesion resection & radiation  . Diagnostic laparoscopy    . Lysis of adhesion    . Salpingoophorectomy Left   . Abdominal hysterectomy    . Pelvic laparoscopy  1992    RSO, AND LSO ON 2007  . Cardiac catheterization      coronary spasm  . Transthoracic echocardiogram  07/2012    LV cavity size mildly reduced, normal wall motion; MV with calcified annulus and mild MR; LA mildly dilated; atrial septum with increased thickness - lipomatous hypertrophy; RV systolic pressure  increased (borderline pulm HTN)    There were no vitals filed for this visit.  Visit Diagnosis:  Loss of balance  Weakness  Difficulty walking  Right leg weakness  Ankle stiffness, right  Myelopathy  Pain in joint, lower leg, right      Subjective Assessment - 07/22/14 1304    Subjective States that she is feeling better than she did yesterday. Reports that she can tell a difference in her ankle and hamstring, and states that she is becoming steadier with her balance.   Patient Stated Goals Improve my balance and hamstring strength.   Currently in Pain? No/denies            [facility] PT Assessment - 07/22/14 0001    Assessment   Medical Diagnosis Balance disorder.   Onset Date/Surgical Date 01/25/13                     [facility] Adult PT Treatment/Exercise - 07/22/14 0001    Knee/Hip Exercises: Stretches   Active Hamstring Stretch 3 reps;30 seconds   Knee/Hip Exercises: Machines for Strengthening   Cybex Knee Extension 10# 3 sets 10 reps   Cybex Knee Flexion 20# 3 sets 10 reps   Knee/Hip Exercises: Standing   Forward Step Up Right;3 sets;10 reps;Step Height: 4";Other (comment)  Backwards   Rocker Board 3 minutes   Ankle Exercises: Seated   Ankle Circles/Pumps Strengthening;Right;Other reps (comment)  x30 reps with isolator donned 1#   Other Seated Ankle Exercises 4D ankle isolator 1# x30 reps each   Ankle Exercises: Standing   BAPS Sitting;Level 2;Other (comment)  12/6, CW circles   Rocker Board 3 minutes             Balance Exercises - 07/22/14 1330    Balance Exercises: Standing   SLS with Vectors Foam/compliant surface;3 reps;30 secs;Other (comment);Upper extremity assist 1  Bilateraly   Rockerboard Anterior/posterior;Intermittent UE support  x3 min   Marching Limitations x2 min on airex   Other Standing Exercises BLE standing on inverted BOSU with intermittant UE support x63min L/R; stepping onto airex with SLS bilaterally x30 reps each              PT Short Term Goals - 07/22/14 1319    PT SHORT TERM GOAL #1   Title In with HEP.   Time 2   Period Weeks   Status Achieved   PT SHORT TERM GOAL #2   Title Pt pain to be no greater than a 5/10 80% of the time   Time 4   Period Weeks   Status Achieved   PT SHORT TERM GOAL #3   Title Pt ankle dorsiflexion to improve to -5 to allow a more normalize gait   Time 4   Period Weeks   Status Achieved   PT SHORT TERM GOAL #4   Title Improve Berg score to 54/56.   Time 6   Period Weeks   Status On-going           PT Long Term Goals - 07/22/14 1319    PT LONG TERM GOAL #1   Title Pt right ankle dorsiflexion/eversion and right hamstring to improve to 4+/5 to allow a more normalize gait   Time 6   Period Weeks   Status On-going   PT LONG TERM GOAL #2   Title Improve Berg score to 54/56.   Time 6   Period Weeks   Status On-going   PT LONG TERM GOAL #3   Title Pt ankle ROM to be improved to 5 degrees to allow for a normalize gait    Time 6   Period Weeks   Status On-going   PT LONG TERM GOAL #4   Title Pt to be able to SLS x 5 seconds on Rt for decreased risk of falling   Time 6   Period Weeks   Status Achieved   PT LONG TERM GOAL #5   Title Pt to feel comfortable walking in her home without an assistive device    Time 6   Period Weeks   Status On-going               Plan - 07/22/14 1345    Clinical Impression Statement Patient continues to tolerate treatment very well and is eager to improve. Continues to demonstrate R hip hike during toe taps to elevation RLE and decreased R knee flexion during marching. Achieved SLS x5 sec goal today but all others are considered on-going due to impaired balance, decreased strength, ROM, and use of assistive device. Required moderate verbal cueing and demonstration for technique of BAPS board, and minimal cueing for all other exercises and use of fingertips during balance exercises. Denied any pain following  treatment.   Pt will benefit from skilled therapeutic intervention in order to  improve on the following deficits Decreased strength   PT Frequency 2x / week   PT Duration 6 weeks   PT Treatment/Interventions Patient/family education;Therapeutic exercise;Therapeutic activities;Balance training   PT Next Visit Plan Continue per MPT POC.   PT Home Exercise Plan given quad stretch and prone knee flexion.   Consulted and Agree with Plan of Care Patient        Problem List Patient Active Problem List   Diagnosis Date Noted  . Baker's cyst of knee 02/14/2013  . Coronary artery spasm 11/19/2012  . Transverse myelitis 10/10/2012  . HTN (hypertension) 08/29/2012  . Arthritis of neck 08/29/2012  . Adenocarcinoma of unknown primary 02/06/2011  . Chest pain 08/16/2010  . Anxiety 05/19/2010  . CAD 12/03/2009  . GERD 12/03/2009  . ANGINA, UNSTABLE 12/02/2009    Wynelle Fanny, PTA 07/22/2014, 1:48 PM  [facility] [address] [place], [place], [address] Phone: [phone]   Fax:  [phone]

## 2014-07-27 ENCOUNTER — Telehealth: Payer: Self-pay | Admitting: Cardiovascular Disease

## 2014-07-27 ENCOUNTER — Telehealth: Payer: Self-pay | Admitting: Family Medicine

## 2014-07-27 ENCOUNTER — Encounter: Payer: Medicare Other | Admitting: Physical Therapy

## 2014-07-27 NOTE — Telephone Encounter (Signed)
Patient wants to know if cardiologist referral has been done.  Advised patient that the referral was done on 07/09/14 and Dr. Rosezella [place] office called her 07/16/14 and left message for her to return call.  Patient given phone number to Dr. Rosezella Florida office to call to make appt.

## 2014-07-27 NOTE — Telephone Encounter (Signed)
Closed encounter °

## 2014-07-29 ENCOUNTER — Encounter: Payer: Self-pay | Admitting: Physical Therapy

## 2014-07-29 ENCOUNTER — Ambulatory Visit: Payer: Medicare Other | Admitting: Physical Therapy

## 2014-07-29 DIAGNOSIS — R262 Difficulty in walking, not elsewhere classified: Secondary | ICD-10-CM | POA: Diagnosis not present

## 2014-07-29 DIAGNOSIS — R531 Weakness: Secondary | ICD-10-CM

## 2014-07-29 DIAGNOSIS — M25561 Pain in right knee: Secondary | ICD-10-CM | POA: Diagnosis not present

## 2014-07-29 DIAGNOSIS — R29898 Other symptoms and signs involving the musculoskeletal system: Secondary | ICD-10-CM | POA: Diagnosis not present

## 2014-07-29 DIAGNOSIS — G959 Disease of spinal cord, unspecified: Secondary | ICD-10-CM | POA: Diagnosis not present

## 2014-07-29 DIAGNOSIS — M25671 Stiffness of right ankle, not elsewhere classified: Secondary | ICD-10-CM | POA: Diagnosis not present

## 2014-07-29 DIAGNOSIS — R2689 Other abnormalities of gait and mobility: Secondary | ICD-10-CM

## 2014-07-29 DIAGNOSIS — R29818 Other symptoms and signs involving the nervous system: Secondary | ICD-10-CM | POA: Diagnosis not present

## 2014-07-29 NOTE — Therapy (Signed)
[facility] [place], [place], [address] Phone: [phone]   Fax:  [phone]  Physical Therapy Treatment  Patient Details  Name: Amy Black MRN: [id number] Date of Birth: May 24, 1940 Referring Provider:  Wardell Honour, MD  Encounter Date: 07/29/2014      PT End of Session - 07/29/14 1306    Visit Number 6   Number of Visits 12   Date for PT Re-Evaluation 08/19/14   PT Start Time 1300   PT Stop Time 1359   PT Time Calculation (min) 59 min   Equipment Utilized During Treatment Other (comment)  SPC   Activity Tolerance Patient tolerated treatment well   Behavior During Therapy [facility] for tasks assessed/performed      Past Medical History  Diagnosis Date  . CAD (coronary artery disease)   . Irritable bowel syndrome   . Diverticulosis of colon (without mention of hemorrhage)   . hepatic cyst   . Hyperlipidemia   . Adenosquamous carcinoma     left leg 2004 - radiation & resection  . HTN (hypertension)     PT. DENIES  . History of cardiac catheterization   . Unstable angina   . Adenocarcinoma     LEFT LEG  . Insomnia   . Vitamin D deficiency   . Adenocarcinoma of unknown primary 02/06/2011  . History of nuclear stress test 04/2011    lexiscan; normal study, no significant ischemia, low risk   . Coronary artery spasm     Past Surgical History  Procedure Laterality Date  . Resection soft tissue tumor leg / ankle radical  2004    leg lesion resection & radiation  . Diagnostic laparoscopy    . Lysis of adhesion    . Salpingoophorectomy Left   . Abdominal hysterectomy    . Pelvic laparoscopy  1992    RSO, AND LSO ON 2007  . Cardiac catheterization      coronary spasm  . Transthoracic echocardiogram  07/2012    LV cavity size mildly reduced, normal wall motion; MV with calcified annulus and mild MR; LA mildly dilated; atrial septum with increased thickness - lipomatous hypertrophy; RV systolic pressure  increased (borderline pulm HTN)    There were no vitals filed for this visit.  Visit Diagnosis:  Loss of balance  Weakness  Difficulty walking  Ankle stiffness, right  Right leg weakness  Myelopathy  Pain in joint, lower leg, right      Subjective Assessment - 07/29/14 1305    Subjective States that R foot feels heavy today, like its 3-4 lbs heavier than normal but patient reports that this is not unusual for her. States that she ambulated from her bedroom to the kitchen in her home without SPC. States she did not feel comfortable but she did it to try.   Patient Stated Goals Improve my balance and hamstring strength.   Currently in Pain? No/denies            [facility] PT Assessment - 07/29/14 0001    Assessment   Medical Diagnosis Balance disorder.   Onset Date/Surgical Date 01/25/13   Balance   Balance Assessed Yes   Standardized Balance Assessment   Standardized Balance Assessment Berg Balance Test   Berg Balance Test   Sit to Stand Able to stand without using hands and stabilize independently   Standing Unsupported Able to stand safely 2 minutes   Sitting with Back Unsupported but Feet Supported on Floor or Stool Able to  sit safely and securely 2 minutes   Stand to Sit Sits safely with minimal use of hands   Transfers Able to transfer safely, minor use of hands   Standing Unsupported with Eyes Closed Able to stand 10 seconds safely   Standing Ubsupported with Feet Together Able to place feet together independently and stand for 1 minute with supervision   From Standing, Reach Forward with Outstretched Arm Can reach forward >5 cm safely (2")   From Standing Position, Pick up Object from [place] to pick up shoe safely and easily   From Standing Position, Turn to Look Behind Over each Shoulder Looks behind one side only/other side shows less weight shift   Turn 360 Degrees Able to turn 360 degrees safely in 4 seconds or less   Standing Unsupported, Alternately Place  Feet on Step/Stool Able to stand independently and complete 8 steps >20 seconds   Standing Unsupported, One Foot in Front Able to place foot tandem independently and hold 30 seconds   Standing on One Leg Able to lift leg independently and hold equal to or more than 3 seconds   Total Score 49                     [facility] Adult PT Treatment/Exercise - 07/29/14 0001    Exercises   Exercises Knee/Hip;Ankle   Knee/Hip Exercises: Aerobic   Nustep L4 x12 min   Knee/Hip Exercises: Machines for Strengthening   Cybex Knee Extension 10# 3 sets 10 reps   Cybex Knee Flexion 30# 3 sets 10 reps   Ankle Exercises: Standing   Rocker Board 3 minutes   Heel Raises Other (comment)  x30 reps   Toe Raise Other (comment)  x30 reps   Ankle Exercises: Seated   Ankle Circles/Pumps Strengthening;Right;Other reps (comment)  x30 reps with ankle isolator 1#   Other Seated Ankle Exercises 4D ankle isolator 1# x30 reps each             Balance Exercises - 07/29/14 1333    Balance Exercises: Standing   Marching Limitations x2 min on airex   Other Standing Exercises BLE standing on inverted BOSU with intermittant UE support x54min, L/R x3 min 1 UE support; stepping onto airex with SLS bilaterally x30 reps each, toe taps 6" x30 reps bilaterally             PT Short Term Goals - 07/22/14 1319    PT SHORT TERM GOAL #1   Title In with HEP.   Time 2   Period Weeks   Status Achieved   PT SHORT TERM GOAL #2   Title Pt pain to be no greater than a 5/10 80% of the time   Time 4   Period Weeks   Status Achieved   PT SHORT TERM GOAL #3   Title Pt ankle dorsiflexion to improve to -5 to allow a more normalize gait   Time 4   Period Weeks   Status Achieved   PT SHORT TERM GOAL #4   Title Improve Berg score to 54/56.   Time 6   Period Weeks   Status On-going           PT Long Term Goals - 07/22/14 1319    PT LONG TERM GOAL #1   Title Pt right ankle dorsiflexion/eversion and right  hamstring to improve to 4+/5 to allow a more normalize gait   Time 6   Period Weeks   Status On-going  PT LONG TERM GOAL #2   Title Improve Berg score to 54/56.   Time 6   Period Weeks   Status On-going   PT LONG TERM GOAL #3   Title Pt ankle ROM to be improved to 5 degrees to allow for a normalize gait    Time 6   Period Weeks   Status On-going   PT LONG TERM GOAL #4   Title Pt to be able to SLS x 5 seconds on Rt for decreased risk of falling   Time 6   Period Weeks   Status Achieved   PT LONG TERM GOAL #5   Title Pt to feel comfortable walking in her home without an assistive device    Time 6   Period Weeks   Status On-going               Plan - 07/29/14 1403    Clinical Impression Statement Patient continues to tolerate treatment very well and is eager to work and to improve. Continues to demonstrate R hip hike with toe taps and decreased R knee flexion during marching. All remaining goals are considered on-going secondary to decreased ROM, strength, decreased Berg score, and use of assistive device. Continues to have to use at least one UE for support during balance exercises. Berg score collected in therapy session today was 49/56. Denied any pain only fatigue following treatment.   Pt will benefit from skilled therapeutic intervention in order to improve on the following deficits Decreased strength   PT Frequency 2x / week   PT Duration 6 weeks   PT Treatment/Interventions Patient/family education;Therapeutic exercise;Therapeutic activities;Balance training   PT Next Visit Plan Continue per MPT POC.   PT Home Exercise Plan given quad stretch and prone knee flexion.   Consulted and Agree with Plan of Care Patient        Problem List Patient Active Problem List   Diagnosis Date Noted  . Baker's cyst of knee 02/14/2013  . Coronary artery spasm 11/19/2012  . Transverse myelitis 10/10/2012  . HTN (hypertension) 08/29/2012  . Arthritis of neck 08/29/2012  .  Adenocarcinoma of unknown primary 02/06/2011  . Chest pain 08/16/2010  . Anxiety 05/19/2010  . CAD 12/03/2009  . GERD 12/03/2009  . ANGINA, UNSTABLE 12/02/2009    Wynelle Fanny, PTA 07/29/2014, 2:07 PM  [facility] [address] [place], [place], [address] Phone: [phone]   Fax:  [phone]

## 2014-07-30 ENCOUNTER — Encounter: Payer: Self-pay | Admitting: Physical Therapy

## 2014-07-30 ENCOUNTER — Ambulatory Visit: Payer: Medicare Other | Admitting: Physical Therapy

## 2014-07-30 DIAGNOSIS — R2689 Other abnormalities of gait and mobility: Secondary | ICD-10-CM

## 2014-07-30 DIAGNOSIS — R29818 Other symptoms and signs involving the nervous system: Secondary | ICD-10-CM | POA: Diagnosis not present

## 2014-07-30 DIAGNOSIS — R29898 Other symptoms and signs involving the musculoskeletal system: Secondary | ICD-10-CM

## 2014-07-30 DIAGNOSIS — R262 Difficulty in walking, not elsewhere classified: Secondary | ICD-10-CM

## 2014-07-30 DIAGNOSIS — M25671 Stiffness of right ankle, not elsewhere classified: Secondary | ICD-10-CM

## 2014-07-30 DIAGNOSIS — R531 Weakness: Secondary | ICD-10-CM | POA: Diagnosis not present

## 2014-07-30 DIAGNOSIS — G959 Disease of spinal cord, unspecified: Secondary | ICD-10-CM

## 2014-07-30 DIAGNOSIS — M25561 Pain in right knee: Secondary | ICD-10-CM | POA: Diagnosis not present

## 2014-07-30 NOTE — Patient Instructions (Signed)
Strengthening: Wall Slide   Leaning on wall, slowly lower buttocks until thighs are parallel to floor. Hold __3__ seconds. Tighten thigh muscles and return. Repeat __10__ times per set. Do _2-3___ sets per session. Do __2__ sessions per day.  http://orth.exer.us/630   Copyright  VHI. All rights reserved.  Hamstring Curl: Resisted (Sitting)   Facing anchor with tubing on right ankle, leg straight out, bend knee. Repeat _20___ times per set. Do __2__ sets per session. Do __2__ sessions per day.   http://orth.exer.us/668   Copyright  VHI. All rights reserved.

## 2014-07-30 NOTE — Therapy (Signed)
[facility] [place], [place], [address] Phone: [phone]   Fax:  [phone]  Physical Therapy Treatment  Patient Details  Name: Amy Black MRN: [id number] Date of Birth: 1940-05-31 Referring Provider:  Wardell Honour, MD  Encounter Date: 07/30/2014      PT End of Session - 07/30/14 1315    Visit Number 7   Number of Visits 12   Date for PT Re-Evaluation 08/19/14   PT Start Time 1229   PT Stop Time 1315   PT Time Calculation (min) 46 min   Activity Tolerance Patient tolerated treatment well   Behavior During Therapy [facility] for tasks assessed/performed      Past Medical History  Diagnosis Date  . CAD (coronary artery disease)   . Irritable bowel syndrome   . Diverticulosis of colon (without mention of hemorrhage)   . hepatic cyst   . Hyperlipidemia   . Adenosquamous carcinoma     left leg 2004 - radiation & resection  . HTN (hypertension)     PT. DENIES  . History of cardiac catheterization   . Unstable angina   . Adenocarcinoma     LEFT LEG  . Insomnia   . Vitamin D deficiency   . Adenocarcinoma of unknown primary 02/06/2011  . History of nuclear stress test 04/2011    lexiscan; normal study, no significant ischemia, low risk   . Coronary artery spasm     Past Surgical History  Procedure Laterality Date  . Resection soft tissue tumor leg / ankle radical  2004    leg lesion resection & radiation  . Diagnostic laparoscopy    . Lysis of adhesion    . Salpingoophorectomy Left   . Abdominal hysterectomy    . Pelvic laparoscopy  1992    RSO, AND LSO ON 2007  . Cardiac catheterization      coronary spasm  . Transthoracic echocardiogram  07/2012    LV cavity size mildly reduced, normal wall motion; MV with calcified annulus and mild MR; LA mildly dilated; atrial septum with increased thickness - lipomatous hypertrophy; RV systolic pressure increased (borderline pulm HTN)    There were no vitals  filed for this visit.  Visit Diagnosis:  Loss of balance  Weakness  Difficulty walking  Ankle stiffness, right  Right leg weakness  Myelopathy  Pain in joint, lower leg, right      Subjective Assessment - 07/30/14 1231    Subjective Tolerated treatment well last visit and has reported no falls   Patient Stated Goals Improve my balance and hamstring strength.   Currently in Pain? No/denies            [facility] PT Assessment - 07/30/14 0001    Berg Balance Test   Sit to Stand Able to stand without using hands and stabilize independently   Standing Unsupported Able to stand safely 2 minutes   Sitting with Back Unsupported but Feet Supported on Floor or Stool Able to sit safely and securely 2 minutes   Stand to Sit Sits safely with minimal use of hands   Transfers Able to transfer safely, minor use of hands   Standing Unsupported with Eyes Closed Able to stand 10 seconds safely   Standing Ubsupported with Feet Together Able to place feet together independently and stand 1 minute safely   From Standing, Reach Forward with Outstretched Arm Can reach forward >12 cm safely (5")   From Standing Position, Pick up Object from [place]  to pick up shoe safely and easily   From Standing Position, Turn to Look Behind Over each Shoulder Looks behind from both sides and weight shifts well   Turn 360 Degrees Able to turn 360 degrees safely in 4 seconds or less   Standing Unsupported, Alternately Place Feet on Step/Stool Able to stand independently and complete 8 steps >20 seconds   Standing Unsupported, One Foot in [place] to place foot tandem independently and hold 30 seconds   Standing on One Leg Able to lift leg independently and hold equal to or more than 3 seconds   Total Score 52                     [facility] Adult PT Treatment/Exercise - 07/30/14 0001    Knee/Hip Exercises: Aerobic   Nustep L4 x15 min LE only   Knee/Hip Exercises: Machines for Strengthening   Cybex  Knee Extension 10# 3 sets 10 reps   Cybex Knee Flexion 30# 3 sets 10 reps   Knee/Hip Exercises: Standing   Wall Squat 2 sets;10 reps   Knee/Hip Exercises: Seated   Hamstring Curl Strengthening;Right;3 sets;10 reps   Ankle Exercises: Standing   Rocker Board 4 minutes             Balance Exercises - 07/30/14 1303    Balance Exercises: Standing   Step Ups Forward;6 inch;Intermittent UE support   Marching Limitations x2 min on airex   Other Standing Exercises BLE standing on inverted BOSU with intermittant UE support x24min, L/R x3 min 1 UE support; stepping onto airex with SLS bilaterally x30 reps each, toe taps 6" x30 reps bilaterally           PT Education - 07/30/14 1314    Education provided Yes   Education Details HEP   Person(s) Educated Patient   Methods Explanation;Demonstration;Handout   Comprehension Verbalized understanding;Returned demonstration          PT Short Term Goals - 07/30/14 1328    PT SHORT TERM GOAL #1   Title In with HEP.   Time 2   Period Weeks   Status Achieved   PT SHORT TERM GOAL #2   Title Pt pain to be no greater than a 5/10 80% of the time   Time 4   Period Weeks   Status Achieved   PT SHORT TERM GOAL #3   Title Pt ankle dorsiflexion to improve to -5 to allow a more normalize gait   Time 4   Period Weeks   Status Achieved   PT SHORT TERM GOAL #4   Title Improve Berg score to 54/56.   Time 6   Period Weeks   Status On-going  52/56           PT Long Term Goals - 07/22/14 1319    PT LONG TERM GOAL #1   Title Pt right ankle dorsiflexion/eversion and right hamstring to improve to 4+/5 to allow a more normalize gait   Time 6   Period Weeks   Status On-going   PT LONG TERM GOAL #2   Title Improve Berg score to 54/56.   Time 6   Period Weeks   Status On-going   PT LONG TERM GOAL #3   Title Pt ankle ROM to be improved to 5 degrees to allow for a normalize gait    Time 6   Period Weeks   Status On-going   PT LONG TERM  GOAL #4   Title  Pt to be able to SLS x 5 seconds on Rt for decreased risk of falling   Time 6   Period Weeks   Status Achieved   PT LONG TERM GOAL #5   Title Pt to feel comfortable walking in her home without an assistive device    Time 6   Period Weeks   Status On-going               Plan - 07/30/14 1315    Clinical Impression Statement Patient continues to progress with all activities. Patient has improved with Balance activities and BERG Score to 52/56 today. Patint has difficulty with HS muscle weakness effecting her to walk with a stiff gait (not bending Rt knee) 3+ to 4-/5 Right HS strength. goals ongoing due to weakness in right LE   Pt will benefit from skilled therapeutic intervention in order to improve on the following deficits Decreased strength   PT Frequency 2x / week   PT Duration 6 weeks   PT Treatment/Interventions Patient/family education;Therapeutic exercise;Therapeutic activities;Balance training   PT Next Visit Plan Continue per MPT POC.   Consulted and Agree with Plan of Care Patient        Problem List Patient Active Problem List   Diagnosis Date Noted  . Baker's cyst of knee 02/14/2013  . Coronary artery spasm 11/19/2012  . Transverse myelitis 10/10/2012  . HTN (hypertension) 08/29/2012  . Arthritis of neck 08/29/2012  . Adenocarcinoma of unknown primary 02/06/2011  . Chest pain 08/16/2010  . Anxiety 05/19/2010  . CAD 12/03/2009  . GERD 12/03/2009  . ANGINA, UNSTABLE 12/02/2009    Jerimiah Wolman P, PTA 07/30/2014, 1:31 PM  [facility] [place], [place], [address] Phone: [phone]   Fax:  [phone]

## 2014-08-04 ENCOUNTER — Ambulatory Visit: Payer: Medicare Other | Admitting: Physical Therapy

## 2014-08-04 ENCOUNTER — Encounter: Payer: Self-pay | Admitting: Physical Therapy

## 2014-08-04 VITALS — HR 70

## 2014-08-04 DIAGNOSIS — R29818 Other symptoms and signs involving the nervous system: Secondary | ICD-10-CM | POA: Diagnosis not present

## 2014-08-04 DIAGNOSIS — M25561 Pain in right knee: Secondary | ICD-10-CM | POA: Diagnosis not present

## 2014-08-04 DIAGNOSIS — M25671 Stiffness of right ankle, not elsewhere classified: Secondary | ICD-10-CM

## 2014-08-04 DIAGNOSIS — R262 Difficulty in walking, not elsewhere classified: Secondary | ICD-10-CM | POA: Diagnosis not present

## 2014-08-04 DIAGNOSIS — R29898 Other symptoms and signs involving the musculoskeletal system: Secondary | ICD-10-CM | POA: Diagnosis not present

## 2014-08-04 DIAGNOSIS — G959 Disease of spinal cord, unspecified: Secondary | ICD-10-CM

## 2014-08-04 DIAGNOSIS — R531 Weakness: Secondary | ICD-10-CM

## 2014-08-04 DIAGNOSIS — R2689 Other abnormalities of gait and mobility: Secondary | ICD-10-CM

## 2014-08-04 NOTE — Therapy (Signed)
[facility] [place], [place], [address] Phone: [phone]   Fax:  [phone]  Physical Therapy Treatment  Patient Details  Name: Amy Black MRN: [id number] Date of Birth: 08/16/1940 Referring Provider:  Wardell Honour, MD  Encounter Date: 08/04/2014      PT End of Session - 08/04/14 1302    Visit Number 8   Number of Visits 12   Date for PT Re-Evaluation 08/19/14   PT Start Time 1300   PT Stop Time 1345   PT Time Calculation (min) 45 min   Equipment Utilized During Treatment Other (comment)  SPC   Behavior During Therapy [employer] for tasks assessed/performed      Past Medical History  Diagnosis Date  . CAD (coronary artery disease)   . Irritable bowel syndrome   . Diverticulosis of colon (without mention of hemorrhage)   . hepatic cyst   . Hyperlipidemia   . Adenosquamous carcinoma     left leg 2004 - radiation & resection  . HTN (hypertension)     PT. DENIES  . History of cardiac catheterization   . Unstable angina   . Adenocarcinoma     LEFT LEG  . Insomnia   . Vitamin D deficiency   . Adenocarcinoma of unknown primary 02/06/2011  . History of nuclear stress test 04/2011    lexiscan; normal study, no significant ischemia, low risk   . Coronary artery spasm     Past Surgical History  Procedure Laterality Date  . Resection soft tissue tumor leg / ankle radical  2004    leg lesion resection & radiation  . Diagnostic laparoscopy    . Lysis of adhesion    . Salpingoophorectomy Left   . Abdominal hysterectomy    . Pelvic laparoscopy  1992    RSO, AND LSO ON 2007  . Cardiac catheterization      coronary spasm  . Transthoracic echocardiogram  07/2012    LV cavity size mildly reduced, normal wall motion; MV with calcified annulus and mild MR; LA mildly dilated; atrial septum with increased thickness - lipomatous hypertrophy; RV systolic pressure increased (borderline pulm HTN)    Filed Vitals:   08/04/14 1303  Pulse: 70  SpO2: 98%    Visit Diagnosis:  Loss of balance  Weakness  Difficulty walking  Ankle stiffness, right  Right leg weakness  Myelopathy  Pain in joint, lower leg, right      Subjective Assessment - 08/04/14 1302    Subjective "I laid in bed all day yesterday. My heart doesn't want to act right." States that she has seen 2 neurologists and had scans from head to sacrum and they do not know what is wrong.   Patient Stated Goals Improve my balance and hamstring strength.   Currently in Pain? Yes   Pain Score 6    Pain Location Back   Pain Orientation Lower   Pain Descriptors / Indicators Nagging            [facility] PT Assessment - 08/04/14 0001    Assessment   Medical Diagnosis Balance disorder.   Onset Date/Surgical Date 01/25/13                     [facility] Adult PT Treatment/Exercise - 08/04/14 0001    Knee/Hip Exercises: Aerobic   Nustep L4 x13 min   Knee/Hip Exercises: Machines for Strengthening   Cybex Knee Extension 20# 3 sets 10 reps   Cybex Knee  Flexion 30# 3 sets 10 reps   Knee/Hip Exercises: Standing   Wall Squat 2 sets;10 reps   Knee/Hip Exercises: Seated   Abduction/Adduction  Strengthening;Right;3 sets;10 reps;Limitations   Abd/Adduction Limitations Over 3 stacked cones to improve ability in getting in/out of car   Ankle Exercises: Seated   Other Seated Ankle Exercises 4D ankle isolator 1.5# x30 reps each             Balance Exercises - 08/04/14 1334    Balance Exercises: Standing   Rockerboard Lateral;Other reps (comment);Intermittent UE support  x3 min   Other Standing Exercises BLE standing on inverted BOSU with intermittant UE support x40min, stepping onto airex with SLS bilaterally x30 reps each, toe taps 8" x30 reps bilaterally             PT Short Term Goals - 07/30/14 1328    PT SHORT TERM GOAL #1   Title In with HEP.   Time 2   Period Weeks   Status Achieved   PT SHORT TERM GOAL #2    Title Pt pain to be no greater than a 5/10 80% of the time   Time 4   Period Weeks   Status Achieved   PT SHORT TERM GOAL #3   Title Pt ankle dorsiflexion to improve to -5 to allow a more normalize gait   Time 4   Period Weeks   Status Achieved   PT SHORT TERM GOAL #4   Title Improve Berg score to 54/56.   Time 6   Period Weeks   Status On-going  52/56           PT Long Term Goals - 07/22/14 1319    PT LONG TERM GOAL #1   Title Pt right ankle dorsiflexion/eversion and right hamstring to improve to 4+/5 to allow a more normalize gait   Time 6   Period Weeks   Status On-going   PT LONG TERM GOAL #2   Title Improve Berg score to 54/56.   Time 6   Period Weeks   Status On-going   PT LONG TERM GOAL #3   Title Pt ankle ROM to be improved to 5 degrees to allow for a normalize gait    Time 6   Period Weeks   Status On-going   PT LONG TERM GOAL #4   Title Pt to be able to SLS x 5 seconds on Rt for decreased risk of falling   Time 6   Period Weeks   Status Achieved   PT LONG TERM GOAL #5   Title Pt to feel comfortable walking in her home without an assistive device    Time 6   Period Weeks   Status On-going               Plan - 08/04/14 1303    Clinical Impression Statement Patient continues to tolerate treamtents well without complaints during treatment. Patient was excited regarding advancing exercises in funcitonal ways as well as in resistance. Continues to demonstrate decreased R knee flexion during seated hip abduction over cones and toe taps to 8" step as well as during ambulation. Goals remain on-going at this time due to decreased ROM, strength, use of assistive device. Vitals monitored at end of session at 98% O2 and 80 bpm.    Pt will benefit from skilled therapeutic intervention in order to improve on the following deficits Decreased strength   PT Frequency 2x / week   PT Duration 6 weeks  PT Treatment/Interventions Patient/family education;Therapeutic  exercise;Therapeutic activities;Balance training   PT Next Visit Plan Continue per MPT POC.   PT Home Exercise Plan given quad stretch and prone knee flexion.   Consulted and Agree with Plan of Care Patient        Problem List Patient Active Problem List   Diagnosis Date Noted  . Baker's cyst of knee 02/14/2013  . Coronary artery spasm 11/19/2012  . Transverse myelitis 10/10/2012  . HTN (hypertension) 08/29/2012  . Arthritis of neck 08/29/2012  . Adenocarcinoma of unknown primary 02/06/2011  . Chest pain 08/16/2010  . Anxiety 05/19/2010  . CAD 12/03/2009  . GERD 12/03/2009  . ANGINA, UNSTABLE 12/02/2009    Wynelle Fanny, PTA 08/04/2014, 2:01 PM  [facility] [address] [place], [place], [address] Phone: [phone]   Fax:  [phone]

## 2014-08-06 ENCOUNTER — Ambulatory Visit: Payer: Medicare Other | Admitting: Physical Therapy

## 2014-08-06 ENCOUNTER — Encounter: Payer: Self-pay | Admitting: Physical Therapy

## 2014-08-06 DIAGNOSIS — M25561 Pain in right knee: Secondary | ICD-10-CM

## 2014-08-06 DIAGNOSIS — R29898 Other symptoms and signs involving the musculoskeletal system: Secondary | ICD-10-CM

## 2014-08-06 DIAGNOSIS — R29818 Other symptoms and signs involving the nervous system: Secondary | ICD-10-CM | POA: Diagnosis not present

## 2014-08-06 DIAGNOSIS — R262 Difficulty in walking, not elsewhere classified: Secondary | ICD-10-CM | POA: Diagnosis not present

## 2014-08-06 DIAGNOSIS — G959 Disease of spinal cord, unspecified: Secondary | ICD-10-CM

## 2014-08-06 DIAGNOSIS — R531 Weakness: Secondary | ICD-10-CM | POA: Diagnosis not present

## 2014-08-06 DIAGNOSIS — R2689 Other abnormalities of gait and mobility: Secondary | ICD-10-CM

## 2014-08-06 DIAGNOSIS — M25671 Stiffness of right ankle, not elsewhere classified: Secondary | ICD-10-CM

## 2014-08-06 NOTE — Therapy (Signed)
[facility] [place], [place], [address] Phone: [phone]   Fax:  [phone]  Physical Therapy Treatment  Patient Details  Name: Amy Black MRN: [id number] Date of Birth: May 17, 1940 Referring Provider:  Wardell Honour, MD  Encounter Date: 08/06/2014      PT End of Session - 08/06/14 1314    Visit Number 9   Number of Visits 12   Date for PT Re-Evaluation 08/19/14   PT Start Time 1229   PT Stop Time 1314   PT Time Calculation (min) 45 min   Activity Tolerance Patient tolerated treatment well   Behavior During Therapy [facility] for tasks assessed/performed      Past Medical History  Diagnosis Date  . CAD (coronary artery disease)   . Irritable bowel syndrome   . Diverticulosis of colon (without mention of hemorrhage)   . hepatic cyst   . Hyperlipidemia   . Adenosquamous carcinoma     left leg 2004 - radiation & resection  . HTN (hypertension)     PT. DENIES  . History of cardiac catheterization   . Unstable angina   . Adenocarcinoma     LEFT LEG  . Insomnia   . Vitamin D deficiency   . Adenocarcinoma of unknown primary 02/06/2011  . History of nuclear stress test 04/2011    lexiscan; normal study, no significant ischemia, low risk   . Coronary artery spasm     Past Surgical History  Procedure Laterality Date  . Resection soft tissue tumor leg / ankle radical  2004    leg lesion resection & radiation  . Diagnostic laparoscopy    . Lysis of adhesion    . Salpingoophorectomy Left   . Abdominal hysterectomy    . Pelvic laparoscopy  1992    RSO, AND LSO ON 2007  . Cardiac catheterization      coronary spasm  . Transthoracic echocardiogram  07/2012    LV cavity size mildly reduced, normal wall motion; MV with calcified annulus and mild MR; LA mildly dilated; atrial septum with increased thickness - lipomatous hypertrophy; RV systolic pressure increased (borderline pulm HTN)    There were no vitals  filed for this visit.  Visit Diagnosis:  Loss of balance  Weakness  Difficulty walking  Ankle stiffness, right  Right leg weakness  Myelopathy  Pain in joint, lower leg, right      Subjective Assessment - 08/06/14 1237    Subjective Feeling better today, with no problems related to heart.   Patient Stated Goals Improve my balance and hamstring strength.   Currently in Pain? No/denies                         [facility] Adult PT Treatment/Exercise - 08/06/14 0001    Knee/Hip Exercises: Aerobic   Nustep L4 x10 min monitored for progression   Knee/Hip Exercises: Machines for Strengthening   Cybex Knee Extension 20# 3 sets 10 reps   Cybex Knee Flexion 20# 3 sets 10 reps with eccentic control   Knee/Hip Exercises: Standing   Wall Squat 3 sets;10 reps   Knee/Hip Exercises: Supine   Bridges Strengthening;20 reps   Bridges with [employer] Strengthening;10 reps   Ankle Exercises: Seated   Other Seated Ankle Exercises ankle isolator 2# x30 reps each   Other Seated Ankle Exercises Assistance with seated HS ecentic control and then isometric holds 2x10   Ankle Exercises: Standing   Toe Raise 20  reps                  PT Short Term Goals - 07/30/14 1328    PT SHORT TERM GOAL #1   Title In with HEP.   Time 2   Period Weeks   Status Achieved   PT SHORT TERM GOAL #2   Title Pt pain to be no greater than a 5/10 80% of the time   Time 4   Period Weeks   Status Achieved   PT SHORT TERM GOAL #3   Title Pt ankle dorsiflexion to improve to -5 to allow a more normalize gait   Time 4   Period Weeks   Status Achieved   PT SHORT TERM GOAL #4   Title Improve Berg score to 54/56.   Time 6   Period Weeks   Status On-going  52/56           PT Long Term Goals - 07/22/14 1319    PT LONG TERM GOAL #1   Title Pt right ankle dorsiflexion/eversion and right hamstring to improve to 4+/5 to allow a more normalize gait   Time 6   Period Weeks   Status  On-going   PT LONG TERM GOAL #2   Title Improve Berg score to 54/56.   Time 6   Period Weeks   Status On-going   PT LONG TERM GOAL #3   Title Pt ankle ROM to be improved to 5 degrees to allow for a normalize gait    Time 6   Period Weeks   Status On-going   PT LONG TERM GOAL #4   Title Pt to be able to SLS x 5 seconds on Rt for decreased risk of falling   Time 6   Period Weeks   Status Achieved   PT LONG TERM GOAL #5   Title Pt to feel comfortable walking in her home without an assistive device    Time 6   Period Weeks   Status On-going               Plan - 08/06/14 1319    Clinical Impression Statement Patient continues to progress and is has improved with eccentric HS strength to improve LE strength and to help threfore improve balance. Patient continues to walk with a cane and has difficulty with hip and knee flexion. goals ongoing due to limitations with strength.   Pt will benefit from skilled therapeutic intervention in order to improve on the following deficits Decreased strength   Clinical Impairments Affecting Rehab Potential transverse myelitis   PT Frequency 2x / week   PT Duration 6 weeks   PT Treatment/Interventions Patient/family education;Therapeutic exercise;Therapeutic activities;Balance training   PT Next Visit Plan Continue per MPT POC.        Problem List Patient Active Problem List   Diagnosis Date Noted  . Baker's cyst of knee 02/14/2013  . Coronary artery spasm 11/19/2012  . Transverse myelitis 10/10/2012  . HTN (hypertension) 08/29/2012  . Arthritis of neck 08/29/2012  . Adenocarcinoma of unknown primary 02/06/2011  . Chest pain 08/16/2010  . Anxiety 05/19/2010  . CAD 12/03/2009  . GERD 12/03/2009  . ANGINA, UNSTABLE 12/02/2009    Tieasha Larsen P, PTA 08/06/2014, 1:22 PM  [facility] [address] [place], [place], [address] Phone: [phone]   Fax:  [phone]

## 2014-08-11 ENCOUNTER — Ambulatory Visit: Payer: Medicare Other | Attending: Family Medicine | Admitting: Physical Therapy

## 2014-08-11 ENCOUNTER — Encounter: Payer: Self-pay | Admitting: Physical Therapy

## 2014-08-11 DIAGNOSIS — R29898 Other symptoms and signs involving the musculoskeletal system: Secondary | ICD-10-CM

## 2014-08-11 DIAGNOSIS — R262 Difficulty in walking, not elsewhere classified: Secondary | ICD-10-CM

## 2014-08-11 DIAGNOSIS — G959 Disease of spinal cord, unspecified: Secondary | ICD-10-CM

## 2014-08-11 DIAGNOSIS — R531 Weakness: Secondary | ICD-10-CM

## 2014-08-11 DIAGNOSIS — R29818 Other symptoms and signs involving the nervous system: Secondary | ICD-10-CM | POA: Insufficient documentation

## 2014-08-11 DIAGNOSIS — M25671 Stiffness of right ankle, not elsewhere classified: Secondary | ICD-10-CM

## 2014-08-11 DIAGNOSIS — R2689 Other abnormalities of gait and mobility: Secondary | ICD-10-CM

## 2014-08-11 NOTE — Therapy (Signed)
[facility] [place], [place], [address] Phone: [phone]   Fax:  [phone]  Physical Therapy Treatment  Patient Details  Name: Amy Black MRN: [id number] Date of Birth: 1940-08-27 Referring Provider:  Wardell Honour, MD  Encounter Date: 08/11/2014      PT End of Session - 08/11/14 1317    Visit Number 10   Number of Visits 12   Date for PT Re-Evaluation 08/19/14   PT Start Time 1229   PT Stop Time 1315   PT Time Calculation (min) 46 min   Activity Tolerance Patient tolerated treatment well   Behavior During Therapy [facility] for tasks assessed/performed      Past Medical History  Diagnosis Date  . CAD (coronary artery disease)   . Irritable bowel syndrome   . Diverticulosis of colon (without mention of hemorrhage)   . hepatic cyst   . Hyperlipidemia   . Adenosquamous carcinoma     left leg 2004 - radiation & resection  . HTN (hypertension)     PT. DENIES  . History of cardiac catheterization   . Unstable angina   . Adenocarcinoma     LEFT LEG  . Insomnia   . Vitamin D deficiency   . Adenocarcinoma of unknown primary 02/06/2011  . History of nuclear stress test 04/2011    lexiscan; normal study, no significant ischemia, low risk   . Coronary artery spasm     Past Surgical History  Procedure Laterality Date  . Resection soft tissue tumor leg / ankle radical  2004    leg lesion resection & radiation  . Diagnostic laparoscopy    . Lysis of adhesion    . Salpingoophorectomy Left   . Abdominal hysterectomy    . Pelvic laparoscopy  1992    RSO, AND LSO ON 2007  . Cardiac catheterization      coronary spasm  . Transthoracic echocardiogram  07/2012    LV cavity size mildly reduced, normal wall motion; MV with calcified annulus and mild MR; LA mildly dilated; atrial septum with increased thickness - lipomatous hypertrophy; RV systolic pressure increased (borderline pulm HTN)    There were no vitals  filed for this visit.  Visit Diagnosis:  Loss of balance  Weakness  Difficulty walking  Ankle stiffness, right  Right leg weakness  Myelopathy      Subjective Assessment - 08/11/14 1232    Subjective some back pain over weekend for unkown reason, better today.   Patient Stated Goals Improve my balance and hamstring strength.   Currently in Pain? No/denies            [facility] PT Assessment - 08/11/14 0001    Berg Balance Test   Sit to Stand Able to stand without using hands and stabilize independently   Standing Unsupported Able to stand safely 2 minutes   Sitting with Back Unsupported but Feet Supported on Floor or Stool Able to sit safely and securely 2 minutes   Stand to Sit Sits safely with minimal use of hands   Transfers Able to transfer safely, minor use of hands   Standing Unsupported with Eyes Closed Able to stand 10 seconds safely   Standing Ubsupported with Feet Together Able to place feet together independently and stand 1 minute safely   From Standing, Reach Forward with Outstretched Arm Can reach confidently >25 cm (10")   From Standing Position, Pick up Object from Floor Able to pick up shoe safely and easily  From Standing Position, Turn to Look Behind Over each Shoulder Looks behind from both sides and weight shifts well   Turn 360 Degrees Able to turn 360 degrees safely in 4 seconds or less   Standing Unsupported, Alternately Place Feet on Step/Stool Able to stand independently and safely and complete 8 steps in 20 seconds   Standing Unsupported, One Foot in [place] to place foot tandem independently and hold 30 seconds   Standing on One Leg Able to lift leg independently and hold equal to or more than 3 seconds   Total Score 54                     [facility] Adult PT Treatment/Exercise - 08/11/14 0001    Knee/Hip Exercises: Aerobic   Nustep L4 x10 min monitored for progression   Knee/Hip Exercises: Machines for Strengthening   Cybex Knee  Extension 20# 3 sets 10 reps   Cybex Knee Flexion 20# 3 sets 10 reps with eccentic control   Knee/Hip Exercises: Supine   Bridges with Ball Squeeze Strengthening;2 sets;10 reps             Balance Exercises - 08/11/14 1258    Balance Exercises: Standing   Standing Eyes Opened 1 rep   Standing Eyes Closed 1 rep   Tandem Stance Eyes open;2 reps   SLS Eyes open;Solid surface   Rockerboard Anterior/posterior  4 min, thex x58mn on bosu   Other Standing Exercises balance on airex with feet together with2# reach outs and up/down 2x10 each   OTAGO PROGRAM   Backwards Walking --  Resisted walking with XTS forward and backward             PT Short Term Goals - 08/11/14 1318    PT SHORT TERM GOAL #1   Title In with HEP.   Time 2   Period Weeks   Status Achieved   PT SHORT TERM GOAL #2   Title Pt pain to be no greater than a 5/10 80% of the time   Time 4   Period Weeks   Status Achieved   PT SHORT TERM GOAL #3   Title Pt ankle dorsiflexion to improve to -5 to allow a more normalize gait   Time 4   Period Weeks   Status Achieved   PT SHORT TERM GOAL #4   Title Improve Berg score to 54/56.   Time 6   Period Weeks   Status Achieved  54/56           PT Long Term Goals - 08/11/14 1318    PT LONG TERM GOAL #1   Title Pt right ankle dorsiflexion/eversion and right hamstring to improve to 4+/5 to allow a more normalize gait   Time 6   Period Weeks   Status On-going   PT LONG TERM GOAL #2   Title Improve Berg score to 54/56.   Time 6   Period Weeks   Status Achieved  54/56   PT LONG TERM GOAL #3   Title Pt ankle ROM to be improved to 5 degrees to allow for a normalize gait    Time 6   Status On-going   PT LONG TERM GOAL #4   Title Pt to be able to SLS x 5 seconds on Rt for decreased risk of falling   Time 6   Period Weeks   Status Achieved   PT LONG TERM GOAL #5   Title Pt to feel comfortable walking  in her home without an assistive device    Time 6    Period Weeks   Status On-going               Plan - 08/11/14 1320    Clinical Impression Statement Patient continues to progress with all activities. Has improved BERG score to 54/56 and met STG#4 and LTG #2 today. Patient continues to have difficulty with HS deficit and ankle ROM. other goals ongoing.   Pt will benefit from skilled therapeutic intervention in order to improve on the following deficits Decreased strength   Clinical Impairments Affecting Rehab Potential transverse myelitis   PT Frequency 2x / week   PT Duration 6 weeks   PT Treatment/Interventions Patient/family education;Therapeutic exercise;Therapeutic activities;Balance training   PT Next Visit Plan Continue per MPT POC for HS strength, ankle strength and ROM and balance activities: resisted walking add side to side and may try cones for side to side and step overs   Consulted and Agree with Plan of Care Patient        Problem List Patient Active Problem List   Diagnosis Date Noted  . Baker's cyst of knee 02/14/2013  . Coronary artery spasm 11/19/2012  . Transverse myelitis 10/10/2012  . HTN (hypertension) 08/29/2012  . Arthritis of neck 08/29/2012  . Adenocarcinoma of unknown primary 02/06/2011  . Chest pain 08/16/2010  . Anxiety 05/19/2010  . CAD 12/03/2009  . GERD 12/03/2009  . ANGINA, UNSTABLE 12/02/2009   Ladean Raya, PTA 08/11/2014 1:24 PM Tarvis Blossom P, PTA 08/11/2014, 1:24 PM  [facility] [address] [place], [place], [address] Phone: [phone]   Fax:  [phone]

## 2014-08-17 ENCOUNTER — Telehealth: Payer: Self-pay | Admitting: Family Medicine

## 2014-08-17 DIAGNOSIS — R531 Weakness: Secondary | ICD-10-CM | POA: Diagnosis not present

## 2014-08-17 DIAGNOSIS — G959 Disease of spinal cord, unspecified: Secondary | ICD-10-CM | POA: Diagnosis not present

## 2014-08-17 DIAGNOSIS — R29898 Other symptoms and signs involving the musculoskeletal system: Secondary | ICD-10-CM | POA: Diagnosis not present

## 2014-08-17 DIAGNOSIS — M25671 Stiffness of right ankle, not elsewhere classified: Secondary | ICD-10-CM | POA: Diagnosis not present

## 2014-08-17 DIAGNOSIS — R29818 Other symptoms and signs involving the nervous system: Secondary | ICD-10-CM | POA: Diagnosis not present

## 2014-08-17 DIAGNOSIS — R262 Difficulty in walking, not elsewhere classified: Secondary | ICD-10-CM | POA: Diagnosis not present

## 2014-08-17 NOTE — Telephone Encounter (Signed)
Patient called in stating that she is having chest discomfort and is extremely fatigued. Patient advised to got to ER but patient refused. Patient given appointment for tomorrow with Marline Backbone, PAC.

## 2014-08-18 ENCOUNTER — Telehealth: Payer: Self-pay | Admitting: Cardiology

## 2014-08-18 ENCOUNTER — Encounter: Payer: Medicare Other | Admitting: Physical Therapy

## 2014-08-18 ENCOUNTER — Ambulatory Visit (INDEPENDENT_AMBULATORY_CARE_PROVIDER_SITE_OTHER): Payer: Medicare Other | Admitting: Physician Assistant

## 2014-08-18 ENCOUNTER — Encounter: Payer: Self-pay | Admitting: Physician Assistant

## 2014-08-18 VITALS — BP 123/68 | HR 76 | Temp 97.9°F | Ht 63.0 in | Wt 160.0 lb

## 2014-08-18 DIAGNOSIS — R9431 Abnormal electrocardiogram [ECG] [EKG]: Secondary | ICD-10-CM

## 2014-08-18 DIAGNOSIS — R0789 Other chest pain: Secondary | ICD-10-CM

## 2014-08-18 NOTE — Telephone Encounter (Signed)
Pt presented to primary care - they are faxing over an EKG from today - concerned for possible changes. Wanted Dr. Percival Spanish to review.  I looked at comparison view in EPIC for 07/09/14 and 08/18/14. Could not note anything concerning. Pt not c/o any new problems. Advised will route to Dr. Percival Spanish for review. If any need can contact pt at home.

## 2014-08-18 NOTE — Progress Notes (Signed)
   Subjective:    Patient ID: Amy Black, female    DOB: 04/23/1940, 74 y.o.   MRN: [id number]  HPI 74 y/o female with h/o unstable angina ,CAD and GERD presents with c/o episode of CP 2 days ago in addition to last night. Sunday she was sitting down when the episode occurred. Last night she was bending over when the episode occurred.  She describes the pain as "tingling" lasting approximately 4 minutes each time. She has a prescription for NTG but did not have any at the time. Instead, patient states that she took a Tramadol, with relief.   Patient also states that she has not been taking her Protonix x 1 month.     Review of Systems  Constitutional: Positive for diaphoresis (during episode of chest pain 2 days ago) and fatigue.  HENT:       Negative for jaw pain   Eyes: Negative.   Respiratory: Positive for shortness of breath (during episodes of CP ).   Cardiovascular: Positive for chest pain (described as "tingling" , relieved with Tramadol). Negative for palpitations and leg swelling.  Gastrointestinal: Positive for nausea (2-3 days prior to episodes of CP ). Negative for abdominal pain.  Endocrine: Negative.   Genitourinary: Negative.   Musculoskeletal: Negative.  Negative for back pain.  Skin: Positive for rash (left arm x 3 days, pruritic - has tried cortisone with mild relief).  Neurological: Positive for weakness (after episodes of CP ). Negative for dizziness, light-headedness, numbness and headaches.  Psychiatric/Behavioral: Negative.        Objective:   Physical Exam  Constitutional: She is oriented to person, place, and time. She appears well-developed and well-nourished.  ECG shows changes from past ECG's indicating 1st degree AV block, vs NSR in the past.   HENT:  Head: Normocephalic and atraumatic.  Cardiovascular: Normal rate, regular rhythm, normal heart sounds and intact distal pulses.  Exam reveals no gallop and no friction rub.   No murmur  heard. Pulmonary/Chest: Effort normal and breath sounds normal. No respiratory distress. She has no wheezes. She has no rales. She exhibits no tenderness.  Musculoskeletal: Normal range of motion. She exhibits no edema.  Neurological: She is alert and oriented to person, place, and time.  Psychiatric: She has a normal mood and affect. Her behavior is normal. Judgment and thought content normal.  Nursing note and vitals reviewed.         Assessment & Plan:  1. Chest discomfort  - EKG 12-Lead indicated 1st degree AV block. Patient was resistant to go to ED after two attempts over the phone and in office. Cardiologist was called for appointment but Provider is out and office will return call to discuss with patient if they can see her in office or if she should go to the ED as previously advised. This was discussed in depth with patient and she agrees to go to hospital or office based on their suggestion. At patient's request, I am allowing her to leave the office until her Cardiologist returns her call, however,  I encouraged patient to call a family member to drive her. She refused to call and insists on driving herself against medical advice. I have also advised her to call 911 if she has any further episodes of CP prior to her call from the cardiologist.   2. ECG abnormality     Lori-Ann Lindfors A. Benjamin Stain PA-C

## 2014-08-19 NOTE — Telephone Encounter (Signed)
The EKG I reviewed was unremarkable.  Call Ms. Krupp with the results and send results to Marline Backbone, PA-C

## 2014-08-19 NOTE — Telephone Encounter (Signed)
Spoke to [employer], communicated Dr.Hochrein's interpretation - she voiced understanding - will contact patient to notify.

## 2014-08-24 ENCOUNTER — Ambulatory Visit (INDEPENDENT_AMBULATORY_CARE_PROVIDER_SITE_OTHER): Payer: Medicare Other | Admitting: Physician Assistant

## 2014-08-24 ENCOUNTER — Telehealth: Payer: Self-pay | Admitting: Cardiology

## 2014-08-24 ENCOUNTER — Encounter: Payer: Self-pay | Admitting: Physician Assistant

## 2014-08-24 ENCOUNTER — Encounter: Payer: Self-pay | Admitting: *Deleted

## 2014-08-24 VITALS — BP 132/70 | HR 55 | Ht 63.0 in | Wt 160.2 lb

## 2014-08-24 DIAGNOSIS — I10 Essential (primary) hypertension: Secondary | ICD-10-CM

## 2014-08-24 DIAGNOSIS — I208 Other forms of angina pectoris: Secondary | ICD-10-CM | POA: Diagnosis not present

## 2014-08-24 DIAGNOSIS — T50995D Adverse effect of other drugs, medicaments and biological substances, subsequent encounter: Secondary | ICD-10-CM

## 2014-08-24 MED ORDER — ISOSORBIDE MONONITRATE ER 30 MG PO TB24: 15.0000 mg | ORAL_TABLET | Freq: Every day | ORAL | Status: DC

## 2014-08-24 MED ORDER — PREDNISONE 20 MG PO TABS: ORAL_TABLET | ORAL | Status: DC

## 2014-08-24 NOTE — Telephone Encounter (Signed)
Fmr pt of Dr. Debara Pickett, Dr. Gwenlyn Found.  Has appt 8/10 to establish care w/ Dr. Percival Spanish.  Hx unstable angina, CAD, GERD.  Reports symptoms of "tiredness in chest", midsternal CP, and "sensation in my arm", usually w/ exertion and occurring for about 2 weeks now.  She was eval'd last week at PCP. Dr. Percival Spanish reviewed an EKG which showed now changes from prior 1 month before.  Pt c/o some fatigue, nausea, denies SOB. Reinforced instruction on Nitrostat. Advised if use of Nitro does not improve symptoms, she should go to ER. Pt acknowledged - however, still wants to see if she can be seen sooner than 8/10.  Will defer to [facility]. If flex clinic appt advisable, will contact [facility] office.

## 2014-08-24 NOTE — Progress Notes (Signed)
Cardiology Office Note   Date:  08/24/2014   ID:  Amy Black, DOB 1940-05-31, MRN [id number]  PCP:  Marline Backbone, PA-C  Cardiologist:  Dr. Quay Burow  >> Dr. Minus Breeding    Chief Complaint  Patient presents with  . Chest Pain     History of Present Illness: Amy Black is a 74 y.o. female with a hx of normal coronary arteries by LHC in 1974 but positive ergotamine stimulation suggesting coronary vasospasm, adenosquamous CA of unknown primary, transverse myelitis, GERD.   Last seen by Dr. Quay Burow 11/2013 with chest pain.  Echo demonstrated EF 55-60%.  Myoview was done and was normal.    She saw her PCP last week for complaints of chest pain.  She was encouraged to go to the ED but the patient declined.  She has an appt to establish with Dr. Minus Breeding in August.    The patient tells me that she has not been feeling well over the past week.  She notes weakness with minimal activity such as household chores. She notes chest heaviness also with minimal activities.  Recently she has noted pain in her chest with bathing.  She describes CCS Class 3 angina.  She notes assoc dyspnea, diaphoresis and L arm pain.  No syncope but she has been dizzy at times.  These symptoms are not like her prior symptoms with vasospasm.  Her current symptoms are worse.  She denies syncope, orthopnea, edema.  She prefers to not take prn NTG.  She typically takes a "pain pill" with relief.     Studies/Reports Reviewed Today:  Echo 12/2013 - Left ventricle: The cavity size was normal. Systolic function was normal. The estimated ejection fraction was in the range of 55% to 60%. Wall motion was normal; there were no regional wall motion abnormalities. Left ventricular diastolic function parameters were normal. - Atrial septum: No defect or patent foramen ovale was identified.  Myoview 12/2013 Overall Impression: Normal stress nuclear study.   Past Medical History    Diagnosis Date  . CAD (coronary artery disease)   . Irritable bowel syndrome   . Diverticulosis of colon (without mention of hemorrhage)   . hepatic cyst   . Hyperlipidemia   . Adenosquamous carcinoma     left leg 2004 - radiation & resection  . HTN (hypertension)     PT. DENIES  . History of cardiac catheterization   . Unstable angina   . Adenocarcinoma     LEFT LEG  . Insomnia   . Vitamin D deficiency   . Adenocarcinoma of unknown primary 02/06/2011  . History of nuclear stress test 04/2011    lexiscan; normal study, no significant ischemia, low risk   . Coronary artery spasm     Past Surgical History  Procedure Laterality Date  . Resection soft tissue tumor leg / ankle radical  2004    leg lesion resection & radiation  . Diagnostic laparoscopy    . Lysis of adhesion    . Salpingoophorectomy Left   . Abdominal hysterectomy    . Pelvic laparoscopy  1992    RSO, AND LSO ON 2007  . Cardiac catheterization      coronary spasm  . Transthoracic echocardiogram  07/2012    LV cavity size mildly reduced, normal wall motion; MV with calcified annulus and mild MR; LA mildly dilated; atrial septum with increased thickness - lipomatous hypertrophy; RV systolic pressure increased (borderline pulm HTN)     Current  Outpatient Prescriptions  Medication Sig Dispense Refill  . ALPRAZolam (XANAX) 0.5 MG tablet TAKE ONE TABLET BY MOUTH THREE TIMES DAILY 90 tablet 1  . aspirin 81 MG tablet Take 1 tablet (81 mg total) by mouth daily. 30 tablet   . cetirizine (ZYRTEC) 10 MG tablet Take 1 tablet (10 mg total) by mouth 2 (two) times daily. 30 tablet 0  . Cholecalciferol (VITAMIN D PO) Take by mouth. OTC    . cyclobenzaprine (FLEXERIL) 5 MG tablet Take 1 tablet (5 mg total) by mouth 3 (three) times daily as needed for muscle spasms. 30 tablet 1  . loperamide (IMODIUM) 2 MG capsule TAKE ONE CAPSULE BY MOUTH FOUR TIMES DAILY AS NEEDED FOR DIARRHEA / LOOSE STOOLS 120 capsule 2  . meloxicam (MOBIC)  15 MG tablet TAKE ONE TABLET BY MOUTH ONCE DAILY 30 tablet 2  . nitroGLYCERIN (NITROSTAT) 0.4 MG SL tablet Place 1 tablet (0.4 mg total) under the tongue every 5 (five) minutes as needed. For chest pain 25 tablet 3  . pantoprazole (PROTONIX) 40 MG tablet Take 1 tablet (40 mg total) by mouth 2 (two) times daily. 180 tablet 1  . traMADol (ULTRAM) 50 MG tablet Take 1 tablet (50 mg total) by mouth every 12 (twelve) hours as needed. 30 tablet 3  . triamterene-hydrochlorothiazide (MAXZIDE-25) 37.5-25 MG per tablet Take 1 tablet by mouth 3 (three) times a week. 30 tablet 1  . verapamil (CALAN) 80 MG tablet TAKE ONE TABLET BY MOUTH FOUR TIMES DAILY AFTER MEALS AND AT BEDTIME 360 tablet 2  . isosorbide mononitrate (IMDUR) 30 MG 24 hr tablet Take 0.5 tablets (15 mg total) by mouth daily. 30 tablet 11   No current facility-administered medications for this visit.    Allergies:   Cymbalta; Gabapentin; Iodine; Ivp dye; and Zanaflex    Social History:  The patient  reports that she quit smoking about 41 years ago. Her smoking use included Cigarettes. She started smoking about 45 years ago. She has a 2 pack-year smoking history. She has never used smokeless tobacco. She reports that she does not drink alcohol or use illicit drugs.   Family History:  The patient's family history includes Cancer in her mother; Coronary artery disease in an other family member; Diabetes in an other family member; Heart disease in her father and mother; Hypertension in her mother; Stroke in her father. There is no history of Colon cancer.    ROS:   Please see the history of present illness.   Review of Systems  Constitution: Positive for chills, decreased appetite and malaise/fatigue.  HENT: Positive for headaches.   Cardiovascular: Positive for chest pain.  Respiratory: Positive for cough.        Non-productive  Gastrointestinal: Positive for abdominal pain, constipation, diarrhea and nausea.       Dark stools.        PHYSICAL EXAM: VS:  BP 132/70 mmHg  Pulse 55  Ht 5\' 3"  (1.6 m)  Wt 160 lb 3.2 oz (72.666 kg)  BMI 28.39 kg/m2    Wt Readings from Last 3 Encounters:  08/24/14 160 lb 3.2 oz (72.666 kg)  08/18/14 160 lb (72.576 kg)  07/20/14 160 lb (72.576 kg)     GEN: Well nourished, well developed, in no acute distress HEENT: normal Neck: no JVD,  no masses Cardiac:  Normal S1/S2, RRR; no murmur, no rubs or gallops, no edema   Respiratory:  clear to auscultation bilaterally, no wheezing, rhonchi or rales. GI: soft, nontender, nondistended, +  BS MS: no deformity or atrophy Skin: warm and dry  Neuro:  CNs II-XII intact, Strength and sensation are intact Psych: Normal affect   EKG:  EKG is ordered today.  It demonstrates:   Sinus brady, HR 55, normal axis, NSSTTW changes, QTc 434 ms.   Recent Labs: 09/10/2013: TSH 0.911 07/20/2014: ALT(SGPT) 19; BUN, Bld 10; Creat 0.9; HGB 11.9; Platelets 416*; Potassium 4.6; Sodium 140    Lipid Panel    Component Value Date/Time   CHOL 203* 08/29/2012 1316   TRIG 131 08/29/2012 1316   HDL 68 08/29/2012 1316   LDLCALC 109* 08/29/2012 1316      ASSESSMENT AND PLAN:    1.  Exertional Angina:  She has symptoms of CCS Class 3 angina.  She does have a hx of coronary vasospasm.  However, her cath was in the 1970s.  She has been taking calcium channel blockers since then. Her current symptoms are not like her prior symptoms.   These symptoms are different and worse.  She had a stress test in 11/2013 that was normal.  We could repeat a stress test but if it were normal, I would still have great concern that she has obstructive CAD and would not trust this result.  She has significant symptoms that are quite concerning.  Continue ASA, Verapamil.  Start Imdur 15 mg QD. I have recommended proceeding with cardiac cath.  I discussed this with Dr. Dorris Carnes ([facility]) who agreed.  Risks and benefits of cardiac catheterization have been discussed with the patient.  These  include bleeding, infection, kidney damage, stroke, heart attack, death.  The patient understands these risks and is willing to proceed.   2.  IV Dye Allergy:  We noticed this on her list after she left the office.  We will call in Prednisone 60 mg and she will take at 6 pm and 12 am the night before her cath and again the morning of her cath.  Will give her IV Benadryl and IV Pepcid on call to the cath lab.    3.  Hypertension:  BP controlled.     Medication Changes: Current medicines are reviewed at length with the patient today.  Concerns regarding medicines are as outlined above.  The following changes have been made:   Discontinued Medications   No medications on file   Modified Medications   No medications on file   New Prescriptions   ISOSORBIDE MONONITRATE (IMDUR) 30 MG 24 HR TABLET    Take 0.5 tablets (15 mg total) by mouth daily.     Labs/ tests ordered today include:   Orders Placed This Encounter  Procedures  . Basic Metabolic Panel (BMET)  . CBC w/Diff  . INR/PT  . EKG 12-Lead     Disposition:   FU with Dr. Minus Breeding as planned.    Signed, Versie Starks, MHS 08/24/2014 4:56 PM    [facility] [place], [place], [place]  [address] Phone: [phone]; Fax: [phone]

## 2014-08-24 NOTE — Telephone Encounter (Signed)
Pt called in stating that since Tuesday she has had extreme fatigue when moving around she feels like her meds need to be adjusted. Please call  Thanks

## 2014-08-24 NOTE — Telephone Encounter (Signed)
[place] for flex clinic [employer]

## 2014-08-24 NOTE — Patient Instructions (Addendum)
Medication Instructions:  1. START IMDUR 15 MG DAILY (THIS WILL BE 1/2 TAB OF THE 30 MG TABLET)  Labwork: TODAY BMET, CBC W/DIFF, PT/INR  Testing/Procedures: Your physician has requested that you have a cardiac catheterization 08/28/14 @ 9 AM. Cardiac catheterization is used to diagnose and/or treat various heart conditions. Doctors may recommend this procedure for a number of different reasons. The most common reason is to evaluate chest pain. Chest pain can be a symptom of coronary artery disease (CAD), and cardiac catheterization can show whether plaque is narrowing or blocking your heart's arteries. This procedure is also used to evaluate the valves, as well as measure the blood flow and oxygen levels in different parts of your heart. For further information please visit [facility]. Please follow instruction sheet, as given.   Follow-Up: KEEP YOUR APPT WITH DR. HOCHREIN 09/2014  Any Other Special Instructions Will Be Listed Below (If Applicable).

## 2014-08-24 NOTE — Telephone Encounter (Signed)
Pt set up by Thomes Dinning for flex clinic to see Richardson Dopp today at 3pm.  I contacted pt to inform of appt time/location/provider. She verbalized understanding & thanks.

## 2014-08-25 ENCOUNTER — Telehealth: Payer: Self-pay | Admitting: Interventional Cardiology

## 2014-08-25 ENCOUNTER — Telehealth: Payer: Self-pay | Admitting: *Deleted

## 2014-08-25 LAB — CBC WITH DIFFERENTIAL/PLATELET
Basophils Absolute: 0 10*3/uL (ref 0.0–0.1)
Basophils Relative: 0.5 % (ref 0.0–3.0)
Eosinophils Absolute: 0.3 10*3/uL (ref 0.0–0.7)
Eosinophils Relative: 4.6 % (ref 0.0–5.0)
HCT: 37.2 % (ref 36.0–46.0)
Hemoglobin: 12.1 g/dL (ref 12.0–15.0)
Lymphocytes Relative: 32.2 % (ref 12.0–46.0)
Lymphs Abs: 2.2 10*3/uL (ref 0.7–4.0)
MCHC: 32.7 g/dL (ref 30.0–36.0)
MCV: 93.5 fl (ref 78.0–100.0)
Monocytes Absolute: 0.5 10*3/uL (ref 0.1–1.0)
Monocytes Relative: 7.2 % (ref 3.0–12.0)
Neutro Abs: 3.7 10*3/uL (ref 1.4–7.7)
Neutrophils Relative %: 55.5 % (ref 43.0–77.0)
Platelets: 446 10*3/uL — ABNORMAL HIGH (ref 150.0–400.0)
RBC: 3.97 Mil/uL (ref 3.87–5.11)
RDW: 13.8 % (ref 11.5–15.5)
WBC: 6.8 10*3/uL (ref 4.0–10.5)

## 2014-08-25 LAB — BASIC METABOLIC PANEL
BUN: 16 mg/dL (ref 6–23)
CO2: 30 mEq/L (ref 19–32)
Calcium: 9.5 mg/dL (ref 8.4–10.5)
Chloride: 100 mEq/L (ref 96–112)
Creatinine, Ser: 0.89 mg/dL (ref 0.40–1.20)
GFR: 80.21 mL/min (ref >60.00)
Glucose, Bld: 88 mg/dL (ref 70–99)
Potassium: 4.7 mEq/L (ref 3.5–5.1)
Sodium: 136 mEq/L (ref 135–145)

## 2014-08-25 LAB — PROTIME-INR
INR: 1 ratio (ref 0.8–1.0)
Prothrombin Time: 11.5 s (ref 9.6–13.1)

## 2014-08-25 NOTE — Telephone Encounter (Signed)
I s/w pt this morning to advise that she will need predisone pre cath; directions given to ptand Rx called into to [facility]. Pt said thank you.

## 2014-08-25 NOTE — Telephone Encounter (Signed)
Spoke with pt and she was wanting to know if she could get admitted into the hospital the night before since her cath was so early in the morning. Informed pt that unless there was a medical reason to admit the night prior, her insurance would not cover this. Pt verbalized understanding.

## 2014-08-25 NOTE — Telephone Encounter (Signed)
Follow Up  Pt called states that she will have a procedure on 08/28/2014 pt request to stay overnight for the [facility] LAB. She states this is so she will already be in the hospital like previous times before. Pt requests a call back to discuss

## 2014-08-27 ENCOUNTER — Other Ambulatory Visit: Payer: Self-pay | Admitting: Physician Assistant

## 2014-08-27 ENCOUNTER — Telehealth: Payer: Self-pay | Admitting: *Deleted

## 2014-08-27 NOTE — Telephone Encounter (Signed)
Pt notified of lab results with verbal understanding by phone. 

## 2014-08-27 NOTE — Telephone Encounter (Signed)
Hi Chelsie, This Rx is for her cath, however Rx was called in to the pharmacy the other day by me. If pt is calling for this do not fill it. Rx called in the other day by me.  Thank you Arbie Cookey

## 2014-08-27 NOTE — Telephone Encounter (Signed)
Hi Carol, can you refill this please if scott approves. I know we don't usually refill these types of medications.

## 2014-08-28 ENCOUNTER — Ambulatory Visit (HOSPITAL_COMMUNITY)
Admission: RE | Admit: 2014-08-28 | Discharge: 2014-08-28 | Disposition: A | Discharge status: Home / Self Care | Payer: Medicare Other | Source: Ambulatory Visit | Attending: Interventional Cardiology | Admitting: Interventional Cardiology

## 2014-08-28 ENCOUNTER — Encounter (HOSPITAL_COMMUNITY)
Admission: RE | Disposition: A | Discharge status: Home / Self Care | Payer: Self-pay | Source: Ambulatory Visit | Attending: Interventional Cardiology

## 2014-08-28 DIAGNOSIS — R0600 Dyspnea, unspecified: Secondary | ICD-10-CM | POA: Insufficient documentation

## 2014-08-28 DIAGNOSIS — I25118 Atherosclerotic heart disease of native coronary artery with other forms of angina pectoris: Secondary | ICD-10-CM | POA: Diagnosis not present

## 2014-08-28 DIAGNOSIS — Z79899 Other long term (current) drug therapy: Secondary | ICD-10-CM | POA: Diagnosis not present

## 2014-08-28 DIAGNOSIS — I208 Other forms of angina pectoris: Secondary | ICD-10-CM | POA: Diagnosis not present

## 2014-08-28 DIAGNOSIS — Z87891 Personal history of nicotine dependence: Secondary | ICD-10-CM | POA: Insufficient documentation

## 2014-08-28 DIAGNOSIS — E785 Hyperlipidemia, unspecified: Secondary | ICD-10-CM | POA: Diagnosis not present

## 2014-08-28 DIAGNOSIS — Z91041 Radiographic dye allergy status: Secondary | ICD-10-CM | POA: Insufficient documentation

## 2014-08-28 DIAGNOSIS — K219 Gastro-esophageal reflux disease without esophagitis: Secondary | ICD-10-CM | POA: Diagnosis not present

## 2014-08-28 DIAGNOSIS — Z7982 Long term (current) use of aspirin: Secondary | ICD-10-CM | POA: Diagnosis not present

## 2014-08-28 DIAGNOSIS — C801 Malignant (primary) neoplasm, unspecified: Secondary | ICD-10-CM | POA: Diagnosis not present

## 2014-08-28 DIAGNOSIS — R079 Chest pain, unspecified: Secondary | ICD-10-CM | POA: Diagnosis present

## 2014-08-28 DIAGNOSIS — I10 Essential (primary) hypertension: Secondary | ICD-10-CM | POA: Diagnosis not present

## 2014-08-28 DIAGNOSIS — Z791 Long term (current) use of non-steroidal anti-inflammatories (NSAID): Secondary | ICD-10-CM | POA: Insufficient documentation

## 2014-08-28 HISTORY — PX: CARDIAC CATHETERIZATION: SHX172

## 2014-08-28 SURGERY — LEFT HEART CATH AND CORONARY ANGIOGRAPHY

## 2014-08-28 MED ORDER — HEPARIN SODIUM (PORCINE) 1000 UNIT/ML IJ SOLN
INTRAMUSCULAR | Status: AC
  Filled 2014-08-28: qty 1

## 2014-08-28 MED ORDER — ASPIRIN 81 MG PO CHEW: 81.0000 mg | CHEWABLE_TABLET | ORAL | Status: DC

## 2014-08-28 MED ORDER — SODIUM CHLORIDE 0.9 % IV SOLN: 250.0000 mL | INTRAVENOUS | Status: DC | PRN

## 2014-08-28 MED ORDER — SODIUM CHLORIDE 0.9 % IJ SOLN: 3.0000 mL | Freq: Two times a day (BID) | INTRAMUSCULAR | Status: DC

## 2014-08-28 MED ORDER — PREDNISONE 20 MG PO TABS
60.0000 mg | ORAL_TABLET | ORAL | Status: AC
  Administered 2014-08-28: 60 mg via ORAL

## 2014-08-28 MED ORDER — FAMOTIDINE IN NACL 20-0.9 MG/50ML-% IV SOLN
INTRAVENOUS | Status: AC
  Filled 2014-08-28: qty 50

## 2014-08-28 MED ORDER — SODIUM CHLORIDE 0.9 % IV SOLN
INTRAVENOUS | Status: DC
  Administered 2014-08-28: 08:00:00 via INTRAVENOUS

## 2014-08-28 MED ORDER — DIPHENHYDRAMINE HCL 50 MG/ML IJ SOLN
INTRAMUSCULAR | Status: AC
  Administered 2014-08-28: 25 mg via INTRAVENOUS
  Filled 2014-08-28: qty 1

## 2014-08-28 MED ORDER — MIDAZOLAM HCL 2 MG/2ML IJ SOLN
INTRAMUSCULAR | Status: DC | PRN
  Administered 2014-08-28: 1 mg via INTRAVENOUS

## 2014-08-28 MED ORDER — FENTANYL CITRATE (PF) 100 MCG/2ML IJ SOLN
INTRAMUSCULAR | Status: DC | PRN
  Administered 2014-08-28: 25 ug via INTRAVENOUS

## 2014-08-28 MED ORDER — VERAPAMIL HCL 2.5 MG/ML IV SOLN
INTRAVENOUS | Status: AC
  Filled 2014-08-28: qty 2

## 2014-08-28 MED ORDER — VERAPAMIL HCL 2.5 MG/ML IV SOLN
INTRAVENOUS | Status: DC | PRN
  Administered 2014-08-28: 11:00:00 via INTRA_ARTERIAL

## 2014-08-28 MED ORDER — PREDNISONE 20 MG PO TABS: 60.0000 mg | ORAL_TABLET | ORAL | Status: DC

## 2014-08-28 MED ORDER — HEPARIN (PORCINE) IN NACL 2-0.9 UNIT/ML-% IJ SOLN
INTRAMUSCULAR | Status: AC
  Filled 2014-08-28: qty 500

## 2014-08-28 MED ORDER — FAMOTIDINE IN NACL 20-0.9 MG/50ML-% IV SOLN
20.0000 mg | INTRAVENOUS | Status: AC
  Administered 2014-08-28: 20 mg via INTRAVENOUS

## 2014-08-28 MED ORDER — LIDOCAINE HCL (PF) 1 % IJ SOLN
INTRAMUSCULAR | Status: AC
  Filled 2014-08-28: qty 30

## 2014-08-28 MED ORDER — LIDOCAINE HCL (PF) 1 % IJ SOLN
INTRAMUSCULAR | Status: DC | PRN
  Administered 2014-08-28: 11:00:00

## 2014-08-28 MED ORDER — DIPHENHYDRAMINE HCL 50 MG/ML IJ SOLN
25.0000 mg | INTRAMUSCULAR | Status: AC
  Administered 2014-08-28: 25 mg via INTRAVENOUS

## 2014-08-28 MED ORDER — FENTANYL CITRATE (PF) 100 MCG/2ML IJ SOLN
INTRAMUSCULAR | Status: AC
  Filled 2014-08-28: qty 2

## 2014-08-28 MED ORDER — SODIUM CHLORIDE 0.9 % IJ SOLN: 3.0000 mL | INTRAMUSCULAR | Status: DC | PRN

## 2014-08-28 MED ORDER — SODIUM CHLORIDE 0.9 % WEIGHT BASED INFUSION: 1.0000 mL/kg/h | INTRAVENOUS | Status: AC

## 2014-08-28 MED ORDER — MIDAZOLAM HCL 2 MG/2ML IJ SOLN
INTRAMUSCULAR | Status: AC
  Filled 2014-08-28: qty 2

## 2014-08-28 MED ORDER — HEPARIN SODIUM (PORCINE) 1000 UNIT/ML IJ SOLN
INTRAMUSCULAR | Status: DC | PRN
  Administered 2014-08-28: 4000 [IU] via INTRAVENOUS

## 2014-08-28 SURGICAL SUPPLY — 11 items

## 2014-08-28 NOTE — H&P (View-Only) (Signed)
Cardiology Office Note   Date:  08/24/2014   ID:  Amy Black, DOB 1940/07/27, MRN [id number]  PCP:  Marline Backbone, PA-C  Cardiologist:  Dr. Quay Burow  >> Dr. Minus Breeding    Chief Complaint  Patient presents with  . Chest Pain     History of Present Illness: Amy Black is a 74 y.o. female with a hx of normal coronary arteries by LHC in 1974 but positive ergotamine stimulation suggesting coronary vasospasm, adenosquamous CA of unknown primary, transverse myelitis, GERD.   Last seen by Dr. Quay Burow 11/2013 with chest pain.  Echo demonstrated EF 55-60%.  Myoview was done and was normal.    She saw her PCP last week for complaints of chest pain.  She was encouraged to go to the ED but the patient declined.  She has an appt to establish with Dr. Minus Breeding in August.    The patient tells me that she has not been feeling well over the past week.  She notes weakness with minimal activity such as household chores. She notes chest heaviness also with minimal activities.  Recently she has noted pain in her chest with bathing.  She describes CCS Class 3 angina.  She notes assoc dyspnea, diaphoresis and L arm pain.  No syncope but she has been dizzy at times.  These symptoms are not like her prior symptoms with vasospasm.  Her current symptoms are worse.  She denies syncope, orthopnea, edema.  She prefers to not take prn NTG.  She typically takes a "pain pill" with relief.     Studies/Reports Reviewed Today:  Echo 12/2013 - Left ventricle: The cavity size was normal. Systolic function was normal. The estimated ejection fraction was in the range of 55% to 60%. Wall motion was normal; there were no regional wall motion abnormalities. Left ventricular diastolic function parameters were normal. - Atrial septum: No defect or patent foramen ovale was identified.  Myoview 12/2013 Overall Impression: Normal stress nuclear study.   Past Medical History    Diagnosis Date  . CAD (coronary artery disease)   . Irritable bowel syndrome   . Diverticulosis of colon (without mention of hemorrhage)   . hepatic cyst   . Hyperlipidemia   . Adenosquamous carcinoma     left leg 2004 - radiation & resection  . HTN (hypertension)     PT. DENIES  . History of cardiac catheterization   . Unstable angina   . Adenocarcinoma     LEFT LEG  . Insomnia   . Vitamin D deficiency   . Adenocarcinoma of unknown primary 02/06/2011  . History of nuclear stress test 04/2011    lexiscan; normal study, no significant ischemia, low risk   . Coronary artery spasm     Past Surgical History  Procedure Laterality Date  . Resection soft tissue tumor leg / ankle radical  2004    leg lesion resection & radiation  . Diagnostic laparoscopy    . Lysis of adhesion    . Salpingoophorectomy Left   . Abdominal hysterectomy    . Pelvic laparoscopy  1992    RSO, AND LSO ON 2007  . Cardiac catheterization      coronary spasm  . Transthoracic echocardiogram  07/2012    LV cavity size mildly reduced, normal wall motion; MV with calcified annulus and mild MR; LA mildly dilated; atrial septum with increased thickness - lipomatous hypertrophy; RV systolic pressure increased (borderline pulm HTN)     Current  Outpatient Prescriptions  Medication Sig Dispense Refill  . ALPRAZolam (XANAX) 0.5 MG tablet TAKE ONE TABLET BY MOUTH THREE TIMES DAILY 90 tablet 1  . aspirin 81 MG tablet Take 1 tablet (81 mg total) by mouth daily. 30 tablet   . cetirizine (ZYRTEC) 10 MG tablet Take 1 tablet (10 mg total) by mouth 2 (two) times daily. 30 tablet 0  . Cholecalciferol (VITAMIN D PO) Take by mouth. OTC    . cyclobenzaprine (FLEXERIL) 5 MG tablet Take 1 tablet (5 mg total) by mouth 3 (three) times daily as needed for muscle spasms. 30 tablet 1  . loperamide (IMODIUM) 2 MG capsule TAKE ONE CAPSULE BY MOUTH FOUR TIMES DAILY AS NEEDED FOR DIARRHEA / LOOSE STOOLS 120 capsule 2  . meloxicam (MOBIC)  15 MG tablet TAKE ONE TABLET BY MOUTH ONCE DAILY 30 tablet 2  . nitroGLYCERIN (NITROSTAT) 0.4 MG SL tablet Place 1 tablet (0.4 mg total) under the tongue every 5 (five) minutes as needed. For chest pain 25 tablet 3  . pantoprazole (PROTONIX) 40 MG tablet Take 1 tablet (40 mg total) by mouth 2 (two) times daily. 180 tablet 1  . traMADol (ULTRAM) 50 MG tablet Take 1 tablet (50 mg total) by mouth every 12 (twelve) hours as needed. 30 tablet 3  . triamterene-hydrochlorothiazide (MAXZIDE-25) 37.5-25 MG per tablet Take 1 tablet by mouth 3 (three) times a week. 30 tablet 1  . verapamil (CALAN) 80 MG tablet TAKE ONE TABLET BY MOUTH FOUR TIMES DAILY AFTER MEALS AND AT BEDTIME 360 tablet 2  . isosorbide mononitrate (IMDUR) 30 MG 24 hr tablet Take 0.5 tablets (15 mg total) by mouth daily. 30 tablet 11   No current facility-administered medications for this visit.    Allergies:   Cymbalta; Gabapentin; Iodine; Ivp dye; and Zanaflex    Social History:  The patient  reports that she quit smoking about 41 years ago. Her smoking use included Cigarettes. She started smoking about 45 years ago. She has a 2 pack-year smoking history. She has never used smokeless tobacco. She reports that she does not drink alcohol or use illicit drugs.   Family History:  The patient's family history includes Cancer in her mother; Coronary artery disease in an other family member; Diabetes in an other family member; Heart disease in her father and mother; Hypertension in her mother; Stroke in her father. There is no history of Colon cancer.    ROS:   Please see the history of present illness.   Review of Systems  Constitution: Positive for chills, decreased appetite and malaise/fatigue.  HENT: Positive for headaches.   Cardiovascular: Positive for chest pain.  Respiratory: Positive for cough.        Non-productive  Gastrointestinal: Positive for abdominal pain, constipation, diarrhea and nausea.       Dark stools.        PHYSICAL EXAM: VS:  BP 132/70 mmHg  Pulse 55  Ht 5\' 3"  (1.6 m)  Wt 160 lb 3.2 oz (72.666 kg)  BMI 28.39 kg/m2    Wt Readings from Last 3 Encounters:  08/24/14 160 lb 3.2 oz (72.666 kg)  08/18/14 160 lb (72.576 kg)  07/20/14 160 lb (72.576 kg)     GEN: Well nourished, well developed, in no acute distress HEENT: normal Neck: no JVD,  no masses Cardiac:  Normal S1/S2, RRR; no murmur, no rubs or gallops, no edema   Respiratory:  clear to auscultation bilaterally, no wheezing, rhonchi or rales. GI: soft, nontender, nondistended, +  BS MS: no deformity or atrophy Skin: warm and dry  Neuro:  CNs II-XII intact, Strength and sensation are intact Psych: Normal affect   EKG:  EKG is ordered today.  It demonstrates:   Sinus brady, HR 55, normal axis, NSSTTW changes, QTc 434 ms.   Recent Labs: 09/10/2013: TSH 0.911 07/20/2014: ALT(SGPT) 19; BUN, Bld 10; Creat 0.9; HGB 11.9; Platelets 416*; Potassium 4.6; Sodium 140    Lipid Panel    Component Value Date/Time   CHOL 203* 08/29/2012 1316   TRIG 131 08/29/2012 1316   HDL 68 08/29/2012 1316   LDLCALC 109* 08/29/2012 1316      ASSESSMENT AND PLAN:    1.  Exertional Angina:  She has symptoms of CCS Class 3 angina.  She does have a hx of coronary vasospasm.  However, her cath was in the 1970s.  She has been taking calcium channel blockers since then. Her current symptoms are not like her prior symptoms.   These symptoms are different and worse.  She had a stress test in 11/2013 that was normal.  We could repeat a stress test but if it were normal, I would still have great concern that she has obstructive CAD and would not trust this result.  She has significant symptoms that are quite concerning.  Continue ASA, Verapamil.  Start Imdur 15 mg QD. I have recommended proceeding with cardiac cath.  I discussed this with Dr. Dorris Carnes (DOD) who agreed.  Risks and benefits of cardiac catheterization have been discussed with the patient.  These  include bleeding, infection, kidney damage, stroke, heart attack, death.  The patient understands these risks and is willing to proceed.   2.  IV Dye Allergy:  We noticed this on her list after she left the office.  We will call in Prednisone 60 mg and she will take at 6 pm and 12 am the night before her cath and again the morning of her cath.  Will give her IV Benadryl and IV Pepcid on call to the cath lab.    3.  Hypertension:  BP controlled.     Medication Changes: Current medicines are reviewed at length with the patient today.  Concerns regarding medicines are as outlined above.  The following changes have been made:   Discontinued Medications   No medications on file   Modified Medications   No medications on file   New Prescriptions   ISOSORBIDE MONONITRATE (IMDUR) 30 MG 24 HR TABLET    Take 0.5 tablets (15 mg total) by mouth daily.     Labs/ tests ordered today include:   Orders Placed This Encounter  Procedures  . Basic Metabolic Panel (BMET)  . CBC w/Diff  . INR/PT  . EKG 12-Lead     Disposition:   FU with Dr. Minus Breeding as planned.    Signed, Versie Starks, MHS 08/24/2014 4:56 PM    [facility] [place], [place], [place]  [address] Phone: [phone]; Fax: [phone]

## 2014-08-28 NOTE — Interval H&P Note (Signed)
Cath Lab Visit (complete for each Cath Lab visit)  Clinical Evaluation Leading to the Procedure:   ACS: no  Non-ACS:    Anginal Classification: CCS III  Anti-ischemic medical therapy: Maximal Therapy (2 or more classes of medications)  Non-Invasive Test Results: No non-invasive testing performed  Prior CABG: No previous CABG   Ischemic Symptoms? CCS III (Marked limitation of ordinary activity) Anti-ischemic Medical Therapy? Maximal Medical Therapy (2 or more classes of medications) Non-invasive Test Results? No non-invasive testing performed Prior CABG? No Previous CABG   Patient Information:   1-2V CAD, no prox LAD  A (7)  Indication: 20; Score: 7   Patient Information:   1-2V-CAD with DS 50-60% With No FFR, No IVUS  I (3)  Indication: 21; Score: 3   Patient Information:   1-2V-CAD with DS 50-60% With FFR  A (7)  Indication: 22; Score: 7   Patient Information:   1-2V-CAD with DS 50-60% With FFR>0.8, IVUS not significant  I (2)  Indication: 23; Score: 2   Patient Information:   3V-CAD without LMCA With Abnormal LV systolic function  A (9)  Indication: 48; Score: 9   Patient Information:   LMCA-CAD  A (9)  Indication: 49; Score: 9   Patient Information:   2V-CAD with prox LAD PCI  A (7)  Indication: 62; Score: 7   Patient Information:   2V-CAD with prox LAD CABG  A (8)  Indication: 62; Score: 8   Patient Information:   3V-CAD without LMCA With Low CAD burden(i.e., 3 focal stenoses, low SYNTAX score) PCI  A (7)  Indication: 63; Score: 7   Patient Information:   3V-CAD without LMCA With Low CAD burden(i.e., 3 focal stenoses, low SYNTAX score) CABG  A (9)  Indication: 63; Score: 9   Patient Information:   3V-CAD without LMCA E06c - Intermediate-high CAD burden (i.e., multiple diffuse lesions, presence of CTO, or high SYNTAX score) PCI  U (4)  Indication: 64; Score: 4   Patient Information:   3V-CAD without  LMCA E06c - Intermediate-high CAD burden (i.e., multiple diffuse lesions, presence of CTO, or high SYNTAX score) CABG  A (9)  Indication: 64; Score: 9   Patient Information:   LMCA-CAD With Isolated LMCA stenosis  PCI  U (6)  Indication: 65; Score: 6   Patient Information:   LMCA-CAD With Isolated LMCA stenosis  CABG  A (9)  Indication: 65; Score: 9   Patient Information:   LMCA-CAD Additional CAD, low CAD burden (i.e., 1- to 2-vessel additional involvement, low SYNTAX score) PCI  U (5)  Indication: 66; Score: 5   Patient Information:   LMCA-CAD Additional CAD, low CAD burden (i.e., 1- to 2-vessel additional involvement, low SYNTAX score) CABG  A (9)  Indication: 66; Score: 9   Patient Information:   LMCA-CAD Additional CAD, intermediate-high CAD burden (i.e., 3-vessel involvement, presence of CTO, or high SYNTAX score) PCI  I (3)  Indication: 67; Score: 3   Patient Information:   LMCA-CAD Additional CAD, intermediate-high CAD burden (i.e., 3-vessel involvement, presence of CTO, or high SYNTAX score) CABG  A (9)  Indication: 67; Score: 9    History and Physical Interval Note:  08/28/2014 10:37 AM  Amy Black  has presented today for surgery, with the diagnosis of angina  The various methods of treatment have been discussed with the patient and family. After consideration of risks, benefits and other options for treatment, the patient has consented to  Procedure(s): Left Heart Cath  and Coronary Angiography (N/A) as a surgical intervention .  The patient's history has been reviewed, patient examined, no change in status, stable for surgery.  I have reviewed the patient's chart and labs.  Questions were answered to the patient's satisfaction.     Amy Black S.

## 2014-08-28 NOTE — Progress Notes (Signed)
Pt states took prednisone 60 mg 08/27/14 at 1800 and 2200

## 2014-08-28 NOTE — Discharge Instructions (Signed)
Radial Site Care °Refer to this sheet in the next few weeks. These instructions provide you with information on caring for yourself after your procedure. Your caregiver may also give you more specific instructions. Your treatment has been planned according to current medical practices, but problems sometimes occur. Call your caregiver if you have any problems or questions after your procedure. °HOME CARE INSTRUCTIONS °· You may shower the day after the procedure. Remove the bandage (dressing) and gently wash the site with plain soap and water. Gently pat the site dry. °· Do not apply powder or lotion to the site. °· Do not submerge the affected site in water for 3 to 5 days. °· Inspect the site at least twice daily. °· Do not flex or bend the affected arm for 24 hours. °· No lifting over 5 pounds (2.3 kg) for 5 days after your procedure. °· Do not drive home if you are discharged the same day of the procedure. Have someone else drive you. °· You may drive 24 hours after the procedure unless otherwise instructed by your caregiver. °· Do not operate machinery or power tools for 24 hours. °· A responsible adult should be with you for the first 24 hours after you arrive home. °What to expect: °· Any bruising will usually fade within 1 to 2 weeks. °· Blood that collects in the tissue (hematoma) may be painful to the touch. It should usually decrease in size and tenderness within 1 to 2 weeks. °SEEK IMMEDIATE MEDICAL CARE IF: °· You have unusual pain at the radial site. °· You have redness, warmth, swelling, or pain at the radial site. °· You have drainage (other than a small amount of blood on the dressing). °· You have chills. °· You have a fever or persistent symptoms for more than 72 hours. °· You have a fever and your symptoms suddenly get worse. °· Your arm becomes pale, cool, tingly, or numb. °· You have heavy bleeding from the site. Hold pressure on the site. °Document Released: 02/25/2010 Document Revised:  04/17/2011 Document Reviewed: 02/25/2010 °ExitCare® Patient Information ©2015 [employer]. This information is not intended to replace advice given to you by your health care provider. Make sure you discuss any questions you have with your health care provider. ° °

## 2014-08-31 ENCOUNTER — Encounter (HOSPITAL_COMMUNITY): Payer: Self-pay | Admitting: Interventional Cardiology

## 2014-08-31 ENCOUNTER — Other Ambulatory Visit: Payer: Self-pay | Admitting: Family Medicine

## 2014-09-08 ENCOUNTER — Other Ambulatory Visit: Payer: Self-pay | Admitting: Family Medicine

## 2014-09-09 NOTE — Telephone Encounter (Signed)
Approved. Please phone in to [facility]. Thanks Dwayn Moravek A. Benjamin Stain PA-C

## 2014-09-09 NOTE — Telephone Encounter (Signed)
Last seen 08/18/14  Amy Black  If approved route to nurse to call into [facility]   [phone]

## 2014-09-09 NOTE — Telephone Encounter (Signed)
Script for xanax called to [facility].

## 2014-09-16 ENCOUNTER — Encounter: Payer: Self-pay | Admitting: Cardiology

## 2014-09-16 ENCOUNTER — Ambulatory Visit (INDEPENDENT_AMBULATORY_CARE_PROVIDER_SITE_OTHER): Payer: Medicare Other | Admitting: Cardiology

## 2014-09-16 VITALS — BP 112/70 | HR 60 | Ht 63.0 in | Wt 160.0 lb

## 2014-09-16 DIAGNOSIS — R072 Precordial pain: Secondary | ICD-10-CM | POA: Diagnosis not present

## 2014-09-16 NOTE — Patient Instructions (Signed)
Medication Instructions:  Please decrease your Verapamil to twice a day. Continue all other medications as listed.  Follow-Up: In 6 weeks with Dr Percival Spanish in [place].  Thank you for choosing [facility]!!

## 2014-09-16 NOTE — Progress Notes (Signed)
Cardiology Office Note   Date:  09/16/2014   ID:  Amy Black, DOB 1940-09-09, MRN [id number]  PCP:  Redge Gainer, MD  Cardiologist:   Minus Breeding, MD   No chief complaint on file.   History of Present Illness: Amy Black is a 74 y.o. female who presents for chest pain. She was hospitalized for this recently prior to seeing me for the first time. She ended up having a cardiac catheterization recently which demonstrated only mild coronary plaque. In discussing this with her she said her symptoms are primarily weakness. She doesn't have a history of cardiac disease with fixed stenosis but apparently had some coronary vasospasm. I don't have these records. I do see that in 2015 she had a negative stress perfusion study. Currently she denies any chest pressure, neck or arm discomfort. She's not having any palpitations and has no presyncope or syncope. She has episodes of diaphoresis is getting ready to go to bed at night   Past Medical History  Diagnosis Date  . CAD (coronary artery disease)   . Irritable bowel syndrome   . Diverticulosis of colon (without mention of hemorrhage)   . hepatic cyst   . Hyperlipidemia   . Adenosquamous carcinoma     left leg 2004 - radiation & resection  . HTN (hypertension)     PT. DENIES  . History of cardiac catheterization   . Unstable angina   . Adenocarcinoma     LEFT LEG  . Insomnia   . Vitamin D deficiency   . Adenocarcinoma of unknown primary 02/06/2011  . History of nuclear stress test 04/2011    lexiscan; normal study, no significant ischemia, low risk   . Coronary artery spasm     Past Surgical History  Procedure Laterality Date  . Resection soft tissue tumor leg / ankle radical  2004    leg lesion resection & radiation  . Diagnostic laparoscopy    . Lysis of adhesion    . Salpingoophorectomy Left   . Abdominal hysterectomy    . Pelvic laparoscopy  1992    RSO, AND LSO ON 2007  . Cardiac catheterization     coronary spasm  . Transthoracic echocardiogram  07/2012    LV cavity size mildly reduced, normal wall motion; MV with calcified annulus and mild MR; LA mildly dilated; atrial septum with increased thickness - lipomatous hypertrophy; RV systolic pressure increased (borderline pulm HTN)  . Cardiac catheterization N/A 08/28/2014    Procedure: Left Heart Cath and Coronary Angiography;  Surgeon: Jettie Booze, MD;  Location: [facility];  Service: Cardiovascular;  Laterality: N/A;     Current Outpatient Prescriptions  Medication Sig Dispense Refill  . ALPRAZolam (XANAX) 0.5 MG tablet TAKE ONE TABLET BY MOUTH THREE TIMES DAILY 90 tablet 1  . aspirin 81 MG tablet Take 1 tablet (81 mg total) by mouth daily. 30 tablet   . cetirizine (ZYRTEC) 10 MG tablet Take 1 tablet (10 mg total) by mouth 2 (two) times daily. 30 tablet 0  . Cholecalciferol (VITAMIN D PO) Take 1 capsule by mouth daily. OTC    . isosorbide mononitrate (IMDUR) 30 MG 24 hr tablet Take 0.5 tablets (15 mg total) by mouth daily. 30 tablet 11  . loperamide (IMODIUM) 2 MG capsule Take by mouth as needed for diarrhea or loose stools.    . meloxicam (MOBIC) 15 MG tablet Take 15 mg by mouth daily.    . nitroGLYCERIN (NITROSTAT) 0.4 MG  SL tablet Place 1 tablet (0.4 mg total) under the tongue every 5 (five) minutes as needed. For chest pain 25 tablet 3  . pantoprazole (PROTONIX) 40 MG tablet Take 40 mg by mouth daily.    . traMADol (ULTRAM) 50 MG tablet Take by mouth every 6 (six) hours as needed for moderate pain.    Marland Kitchen triamterene-hydrochlorothiazide (MAXZIDE-25) 37.5-25 MG per tablet Take 1 tablet by mouth. Take one tablet by mouth three times a week    . verapamil (CALAN) 80 MG tablet Take 80 mg by mouth 4 (four) times daily.    . cyclobenzaprine (FLEXERIL) 5 MG tablet TAKE 1 TABLET THREE TIMES DAILY AS NEEDED FOR SPASMS 30 tablet 0   No current facility-administered medications for this visit.    Allergies:   Cymbalta;  Gabapentin; Iodine; Ivp dye; and Zanaflex     ROS:  Please see the history of present illness.   Otherwise, review of systems are positive for vomiting, diarrhea, constipation, reflux..   All other systems are reviewed and negative.    PHYSICAL EXAM: VS:  BP 112/70 mmHg  Pulse 60  Ht 5\' 3"  (1.6 m)  Wt 160 lb (72.576 kg)  BMI 28.35 kg/m2 , BMI Body mass index is 28.35 kg/(m^2). GENERAL:  Well appearing HEENT:  Pupils equal round and reactive, fundi not visualized, oral mucosa unremarkable NECK:  No jugular venous distention, waveform within normal limits, carotid upstroke brisk and symmetric, no bruits, no thyromegaly LYMPHATICS:  No cervical, inguinal adenopathy LUNGS:  Clear to auscultation bilaterally BACK:  No CVA tenderness CHEST:  Unremarkable HEART:  PMI not displaced or sustained,S1 and S2 within normal limits, no S3, no S4, no clicks, no rubs, brief apical non radiating systolic murmur ABD:  Flat, positive bowel sounds normal in frequency in pitch, no bruits, no rebound, no guarding, no midline pulsatile mass, no hepatomegaly, no splenomegaly EXT:  2 plus pulses throughout, no edema, no cyanosis no clubbing, right wrist catheterization site unremarkable SKIN:  No rashes no nodules NEURO:  Cranial nerves II through XII grossly intact, motor grossly intact throughout PSYCH:  Cognitively intact, oriented to person place and time    EKG:  EKG is not ordered today.   Recent Labs: 07/20/2014: ALT(SGPT) 19 08/24/2014: BUN 16; Creatinine, Ser 0.89; Hemoglobin 12.1; Platelets 446.0*; Potassium 4.7; Sodium 136    Lipid Panel    Component Value Date/Time   CHOL 203* 08/29/2012 1316   TRIG 131 08/29/2012 1316   HDL 68 08/29/2012 1316   LDLCALC 109* 08/29/2012 1316      Wt Readings from Last 3 Encounters:  09/16/14 160 lb (72.576 kg)  08/28/14 160 lb (72.576 kg)  08/24/14 160 lb 3.2 oz (72.666 kg)      Other studies Reviewed: Additional studies/ records that were  reviewed today include: Cath. Review of the above records demonstrates:  Please see elsewhere in the note.     ASSESSMENT AND PLAN:  CHEST PAIN:  This seems to not be an active issue. There is no cardiac etiology. No further cardiac workup will be suggested. Of note there is a mention of coronary spasm. However, patient would like to try to come off of verapamil because of tiredness. I think this is reasonable and I'm going to take her to twice a day and then may be discontinued completely. If she has any increasing chest pain I would probably start a different calcium channel blocker perhaps a once daily formulation.  PALPITATIONS:  These are infrequent. No  change in therapy is indicated.   Current medicines are reviewed at length with the patient today.  The patient does not have concerns regarding medicines.  The following changes have been made:  As above  Labs/ tests ordered today include: None  No orders of the defined types were placed in this encounter.     Disposition:   FU with me in six weeks.     Signed, Minus Breeding, MD  09/16/2014 12:07 PM    [place]

## 2014-09-23 ENCOUNTER — Encounter: Payer: Self-pay | Admitting: Family Medicine

## 2014-09-23 ENCOUNTER — Ambulatory Visit (INDEPENDENT_AMBULATORY_CARE_PROVIDER_SITE_OTHER): Payer: Medicare Other | Admitting: Family Medicine

## 2014-09-23 ENCOUNTER — Ambulatory Visit: Payer: Medicare Other | Admitting: Family Medicine

## 2014-09-23 VITALS — Ht 63.0 in | Wt 162.0 lb

## 2014-09-23 DIAGNOSIS — R0981 Nasal congestion: Secondary | ICD-10-CM | POA: Diagnosis not present

## 2014-09-23 DIAGNOSIS — H5712 Ocular pain, left eye: Secondary | ICD-10-CM | POA: Diagnosis not present

## 2014-09-23 MED ORDER — BETAMETHASONE SOD PHOS & ACET 6 (3-3) MG/ML IJ SUSP
6.0000 mg | Freq: Once | INTRAMUSCULAR | Status: AC
  Administered 2014-09-23: 6 mg via INTRAMUSCULAR

## 2014-09-23 NOTE — Progress Notes (Signed)
Subjective:  Patient ID: Amy Black, female    DOB: 1940/09/23  Age: 74 y.o. MRN: [id number]  CC: Eye Pain and Headache   HPI [name] presents for onset last night of left sided temporal headache. Moderate in intensity. Accompanied by soreness around the left eye. The patient has had a great deal of sinus congestion lately. No significant rhinorrhea or sore throat. The headache as a dull ache. Vision was not affected. Patient states that the headache resolved overnight while she slept. However the soreness around the eye still remains.  History [name] has a past medical history of CAD (coronary artery disease); Irritable bowel syndrome; Diverticulosis of colon (without mention of hemorrhage); hepatic cyst; Hyperlipidemia; Adenosquamous carcinoma; HTN (hypertension); History of cardiac catheterization; Unstable angina; Adenocarcinoma; Insomnia; Vitamin D deficiency; Adenocarcinoma of unknown primary (02/06/2011); History of nuclear stress test (04/2011); and Coronary artery spasm.   She has past surgical history that includes Resection soft tissue tumor leg / ankle radical (2004); Diagnostic laparoscopy; Lysis of adhesion; Salpingoophorectomy (Left); Abdominal hysterectomy; Pelvic laparoscopy (1992); Cardiac catheterization; transthoracic echocardiogram (07/2012); and Cardiac catheterization (N/A, 08/28/2014).   Her family history includes Cancer in her mother; Coronary artery disease in an other family member; Diabetes in an other family member; Heart disease in her father and mother; Hypertension in her mother; Stroke in her father. There is no history of Colon cancer.She reports that she quit smoking about 41 years ago. Her smoking use included Cigarettes. She started smoking about 45 years ago. She has a 2 pack-year smoking history. She has never used smokeless tobacco. She reports that she does not drink alcohol or use illicit drugs.  Outpatient Prescriptions Prior to Visit    Medication Sig Dispense Refill  . ALPRAZolam (XANAX) 0.5 MG tablet TAKE ONE TABLET BY MOUTH THREE TIMES DAILY 90 tablet 1  . aspirin 81 MG tablet Take 1 tablet (81 mg total) by mouth daily. 30 tablet   . cetirizine (ZYRTEC) 10 MG tablet Take 1 tablet (10 mg total) by mouth 2 (two) times daily. 30 tablet 0  . Cholecalciferol (VITAMIN D PO) Take 1 capsule by mouth daily. OTC    . cyclobenzaprine (FLEXERIL) 5 MG tablet TAKE 1 TABLET THREE TIMES DAILY AS NEEDED FOR SPASMS 30 tablet 0  . isosorbide mononitrate (IMDUR) 30 MG 24 hr tablet Take 0.5 tablets (15 mg total) by mouth daily. 30 tablet 11  . loperamide (IMODIUM) 2 MG capsule Take by mouth as needed for diarrhea or loose stools.    . meloxicam (MOBIC) 15 MG tablet Take 15 mg by mouth daily.    . nitroGLYCERIN (NITROSTAT) 0.4 MG SL tablet Place 1 tablet (0.4 mg total) under the tongue every 5 (five) minutes as needed. For chest pain 25 tablet 3  . pantoprazole (PROTONIX) 40 MG tablet Take 40 mg by mouth daily.    . traMADol (ULTRAM) 50 MG tablet Take by mouth every 6 (six) hours as needed for moderate pain.    Marland Kitchen triamterene-hydrochlorothiazide (MAXZIDE-25) 37.5-25 MG per tablet Take 1 tablet by mouth. Take one tablet by mouth three times a week    . verapamil (CALAN) 80 MG tablet Take 80 mg by mouth 2 (two) times daily.     No facility-administered medications prior to visit.    ROS Review of Systems  Constitutional: Negative for fever, chills, activity change and appetite change.  HENT: Positive for congestion, postnasal drip and sinus pressure. Negative for ear discharge, ear pain, hearing loss, nosebleeds, rhinorrhea,  sneezing and trouble swallowing.   Respiratory: Negative for chest tightness and shortness of breath.   Cardiovascular: Negative for chest pain and palpitations.  Skin: Negative for rash.  Neurological: Negative for dizziness and numbness.    Objective:  Ht 5\' 3"  (1.6 m)  Wt 162 lb (73.483 kg)  BMI 28.70 kg/m2  BP  Readings from Last 3 Encounters:  09/16/14 112/70  08/28/14 131/58  08/24/14 132/70    Wt Readings from Last 3 Encounters:  09/23/14 162 lb (73.483 kg)  09/16/14 160 lb (72.576 kg)  08/28/14 160 lb (72.576 kg)     Physical Exam  Constitutional: She is oriented to person, place, and time. She appears well-developed and well-nourished. No distress.  HENT:  Head: Normocephalic and atraumatic.  Right Ear: External ear normal.  Left Ear: External ear normal.  Nose: Nose normal.  Mouth/Throat: Oropharynx is clear and moist.  Eyes: Conjunctivae and EOM are normal. Pupils are equal, round, and reactive to light.  Neck: Normal range of motion. Neck supple. No thyromegaly present.  Cardiovascular: Normal rate, regular rhythm and normal heart sounds.   No murmur heard. Pulmonary/Chest: Effort normal and breath sounds normal. No respiratory distress. She has no wheezes. She has no rales.  Abdominal: Soft. Bowel sounds are normal. She exhibits no distension. There is no tenderness.  Lymphadenopathy:    She has no cervical adenopathy.  Neurological: She is alert and oriented to person, place, and time. She has normal reflexes.  Skin: Skin is warm and dry.  Psychiatric: She has a normal mood and affect. Her behavior is normal. Judgment and thought content normal.    No results found for: HGBA1C  Lab Results  Component Value Date   WBC 6.8 08/24/2014   HGB 12.1 08/24/2014   HCT 37.2 08/24/2014   PLT 446.0* 08/24/2014   GLUCOSE 88 08/24/2014   CHOL 203* 08/29/2012   TRIG 131 08/29/2012   HDL 68 08/29/2012   LDLCALC 109* 08/29/2012   ALT 19 07/20/2014   AST 23 07/20/2014   NA 136 08/24/2014   K 4.7 08/24/2014   CL 100 08/24/2014   CREATININE 0.89 08/24/2014   BUN 16 08/24/2014   CO2 30 08/24/2014   TSH 0.911 09/10/2013   INR 1.0 08/24/2014    No results found.  Assessment & Plan:   Mikka was seen today for eye pain and headache.  Diagnoses and all orders for this  visit:  Eye pain, left -     betamethasone acetate-betamethasone sodium phosphate (CELESTONE) injection 6 mg; Inject 1 mL (6 mg total) into the muscle once.  Congestion of paranasal sinus -     betamethasone acetate-betamethasone sodium phosphate (CELESTONE) injection 6 mg; Inject 1 mL (6 mg total) into the muscle once.   I am having Ms. Winship maintain her aspirin, nitroGLYCERIN, cetirizine, Cholecalciferol (VITAMIN D PO), isosorbide mononitrate, cyclobenzaprine, ALPRAZolam, loperamide, meloxicam, pantoprazole, traMADol, triamterene-hydrochlorothiazide, and verapamil. We administered betamethasone acetate-betamethasone sodium phosphate.  Meds ordered this encounter  Medications  . betamethasone acetate-betamethasone sodium phosphate (CELESTONE) injection 6 mg    Sig:      Follow-up: Return if symptoms worsen or fail to improve.  [name], M.D.

## 2014-09-25 ENCOUNTER — Ambulatory Visit: Payer: Medicare Other | Admitting: Family Medicine

## 2014-10-01 ENCOUNTER — Ambulatory Visit: Payer: Medicare Other | Admitting: Family Medicine

## 2014-10-16 DIAGNOSIS — H20013 Primary iridocyclitis, bilateral: Secondary | ICD-10-CM | POA: Diagnosis not present

## 2014-10-23 DIAGNOSIS — H20013 Primary iridocyclitis, bilateral: Secondary | ICD-10-CM | POA: Diagnosis not present

## 2014-10-28 ENCOUNTER — Other Ambulatory Visit: Payer: Self-pay | Admitting: Family Medicine

## 2014-10-28 ENCOUNTER — Encounter: Payer: Self-pay | Admitting: Cardiology

## 2014-10-28 ENCOUNTER — Ambulatory Visit (INDEPENDENT_AMBULATORY_CARE_PROVIDER_SITE_OTHER): Payer: Medicare Other | Admitting: Cardiology

## 2014-10-28 VITALS — BP 122/78 | HR 56 | Ht 63.0 in | Wt 158.0 lb

## 2014-10-28 DIAGNOSIS — R072 Precordial pain: Secondary | ICD-10-CM

## 2014-10-28 NOTE — Progress Notes (Signed)
Cardiology Office Note   Date:  10/28/2014   ID:  Amy Black, DOB [birth year], MRN [id number]  PCP:  Redge Gainer, MD  Cardiologist:   Minus Breeding, MD   Chief Complaint  Patient presents with  . Chest Pain    History of Present Illness: Amy Black is a 74 y.o. female who presents for chest pain. She was hospitalized for this this summer and had a cardiac catheterization recently which demonstrated only mild coronary plaque.   She had been treated with verapamil in the past because of possible coronary vasospasm. I don't see a definitive diagnosis of this however. At the last visit her predominant complaint was fatigue and so the decision was to take her off of verapamil.   She was off this medicine for about 6 days but had recurrent chest discomfort. Her fatigue did not improve while off the medication. She restarted it in her chest pain went away. She has since had slow resolution of her fatigue which was the predominant complaint. She is now not getting any chest pressure, neck or arm discomfort. She's not having any palpitations, presyncope or syncope. She has no new shortness of breath , PND or orthopnea.   Past Medical History  Diagnosis Date  . CAD (coronary artery disease)   . Irritable bowel syndrome   . Diverticulosis of colon (without mention of hemorrhage)   . hepatic cyst   . Hyperlipidemia   . Adenosquamous carcinoma     left leg 2004 - radiation & resection  . HTN (hypertension)     PT. DENIES  . History of cardiac catheterization   . Unstable angina   . Adenocarcinoma     LEFT LEG  . Insomnia   . Vitamin D deficiency   . Adenocarcinoma of unknown primary 02/06/2011  . History of nuclear stress test 04/2011    lexiscan; normal study, no significant ischemia, low risk   . Coronary artery spasm     Past Surgical History  Procedure Laterality Date  . Resection soft tissue tumor leg / ankle radical  2004    leg lesion resection & radiation  .  Diagnostic laparoscopy    . Lysis of adhesion    . Salpingoophorectomy Left   . Abdominal hysterectomy    . Pelvic laparoscopy  1992    RSO, AND LSO ON 2007  . Cardiac catheterization      coronary spasm  . Transthoracic echocardiogram  07/2012    LV cavity size mildly reduced, normal wall motion; MV with calcified annulus and mild MR; LA mildly dilated; atrial septum with increased thickness - lipomatous hypertrophy; RV systolic pressure increased (borderline pulm HTN)  . Cardiac catheterization N/A 08/28/2014    Procedure: Left Heart Cath and Coronary Angiography;  Surgeon: Jettie Booze, MD;  Location: [facility];  Service: Cardiovascular;  Laterality: N/A;     Current Outpatient Prescriptions  Medication Sig Dispense Refill  . ALPRAZolam (XANAX) 0.5 MG tablet TAKE ONE TABLET BY MOUTH THREE TIMES DAILY 90 tablet 1  . aspirin 81 MG tablet Take 1 tablet (81 mg total) by mouth daily. 30 tablet   . cetirizine (ZYRTEC) 10 MG tablet Take 1 tablet (10 mg total) by mouth 2 (two) times daily. 30 tablet 0  . Cholecalciferol (VITAMIN D PO) Take 1 capsule by mouth daily. OTC    . cyclobenzaprine (FLEXERIL) 5 MG tablet TAKE 1 TABLET THREE TIMES DAILY AS NEEDED FOR SPASMS 30 tablet 0  .  isosorbide mononitrate (IMDUR) 30 MG 24 hr tablet Take 0.5 tablets (15 mg total) by mouth daily. 30 tablet 11  . loperamide (IMODIUM) 2 MG capsule Take by mouth as needed for diarrhea or loose stools.    . meloxicam (MOBIC) 15 MG tablet Take 15 mg by mouth daily.    . nitroGLYCERIN (NITROSTAT) 0.4 MG SL tablet Place 1 tablet (0.4 mg total) under the tongue every 5 (five) minutes as needed. For chest pain 25 tablet 3  . pantoprazole (PROTONIX) 40 MG tablet Take 40 mg by mouth daily.    . traMADol (ULTRAM) 50 MG tablet Take by mouth every 6 (six) hours as needed for moderate pain.    Marland Kitchen triamterene-hydrochlorothiazide (MAXZIDE-25) 37.5-25 MG per tablet Take one tablet by mouth three times a week    .  verapamil (CALAN) 80 MG tablet Take 80 mg by mouth 4 (four) times daily.      No current facility-administered medications for this visit.    Allergies:   Cymbalta; Gabapentin; Iodine; Ivp dye; and Zanaflex     ROS:  Please see the history of present illness.   Otherwise, review of systems are positive for foot drop.   All other systems are reviewed and negative.    PHYSICAL EXAM: VS:  BP 122/78 mmHg  Pulse 56  Ht 5\' 3"  (1.6 m)  Wt 158 lb (71.668 kg)  BMI 28.00 kg/m2 , BMI Body mass index is 28 kg/(m^2). GENERAL:  Well appearing NECK:  No jugular venous distention, waveform within normal limits, carotid upstroke brisk and symmetric, no bruits, no thyromegaly LUNGS:  Clear to auscultation bilaterally CHEST:  Unremarkable HEART:  PMI not displaced or sustained,S1 and S2 within normal limits, no S3, no S4, no clicks, no rubs, brief apical non radiating systolic murmur ABD:  Flat, positive bowel sounds normal in frequency in pitch, no bruits, no rebound, no guarding, no midline pulsatile mass, no hepatomegaly, no splenomegaly EXT:  2 plus pulses throughout, no edema, no cyanosis no clubbing, right wrist catheterization site unremarkable   EKG:  EKG is not ordered today.   Recent Labs: 07/20/2014: ALT(SGPT) 19 08/24/2014: BUN 16; Creatinine, Ser 0.89; Hemoglobin 12.1; Platelets 446.0*; Potassium 4.7; Sodium 136    Lipid Panel    Component Value Date/Time   CHOL 203* 08/29/2012 1316   TRIG 131 08/29/2012 1316   HDL 68 08/29/2012 1316   LDLCALC 109* 08/29/2012 1316      Wt Readings from Last 3 Encounters:  10/28/14 158 lb (71.668 kg)  09/23/14 162 lb (73.483 kg)  09/16/14 160 lb (72.576 kg)      Other studies Reviewed: Additional studies/ records that were reviewed today include: None Review of the above records demonstrates:  Please see elsewhere in the note.     ASSESSMENT AND PLAN:  CHEST PAIN:     She's no longer having any symptoms. She had no fixed coronary  disease. She will continue on verapamil for presumed coronary vasospasm.  PALPITATIONS:  These are infrequent. No change in therapy is indicated.   Current medicines are reviewed at length with the patient today.  The patient does not have concerns regarding medicines.  The following changes have been made:  None  Labs/ tests ordered today include: None  No orders of the defined types were placed in this encounter.     Disposition:   FU with me in six months.     Signed, Minus Breeding, MD  10/28/2014 3:54 PM    [facility]

## 2014-10-28 NOTE — Telephone Encounter (Signed)
Last seen 09/23/14 Dr Livia Snellen  If approved print

## 2014-10-28 NOTE — Patient Instructions (Signed)
Medication Instructions:  The current medical regimen is effective;  continue present plan and medications.  Follow-Up: Follow up in 6 months with Dr. Percival Spanish.  You will receive a letter in the mail 2 months before you are due.  Please call us when you receive this letter to schedule your follow up appointment.  Thank you for choosing [facility]!!

## 2014-10-29 ENCOUNTER — Encounter: Payer: Self-pay | Admitting: Physician Assistant

## 2014-10-29 ENCOUNTER — Ambulatory Visit (INDEPENDENT_AMBULATORY_CARE_PROVIDER_SITE_OTHER): Payer: Medicare Other | Admitting: Physician Assistant

## 2014-10-29 VITALS — BP 134/71 | HR 58 | Temp 97.5°F | Ht 63.0 in | Wt 158.8 lb

## 2014-10-29 DIAGNOSIS — R42 Dizziness and giddiness: Secondary | ICD-10-CM

## 2014-10-29 DIAGNOSIS — J019 Acute sinusitis, unspecified: Secondary | ICD-10-CM | POA: Diagnosis not present

## 2014-10-29 MED ORDER — FLUTICASONE PROPIONATE 50 MCG/ACT NA SUSP: 2.0000 | Freq: Every day | NASAL | Status: DC

## 2014-10-29 MED ORDER — AMOXICILLIN 875 MG PO TABS: 875.0000 mg | ORAL_TABLET | Freq: Two times a day (BID) | ORAL | Status: DC

## 2014-10-29 NOTE — Telephone Encounter (Signed)
Refill called in to [facility]

## 2014-10-29 NOTE — Progress Notes (Signed)
   Subjective:    Patient ID: Amy Black, female    DOB: 09/10/1940, 74 y.o.   MRN: [id number]  HPI 74 y/o female presents with c/o episodes of weakness and feeling faint this past weekend. Shehas comorbidities of unstable angina, CAD, Htn, anxiety. She saw her cardiologist yesterday. He does not feel that her symptoms are cardiac related.     Review of Systems  HENT: Negative.   Eyes: Negative.   Respiratory: Negative.   Cardiovascular: Negative.   Gastrointestinal: Negative.   Genitourinary: Negative.   Musculoskeletal: Negative.   Skin: Negative.   Neurological: Positive for dizziness, weakness and light-headedness.       Objective:   Physical Exam  Constitutional: She is oriented to person, place, and time. She appears well-developed and well-nourished. No distress.  HENT:  Head: Normocephalic and atraumatic.  Right Ear: External ear normal.  Left Ear: External ear normal.  Nose: Nose normal.  Mouth/Throat: Oropharynx is clear and moist. No oropharyngeal exudate.  ttp bilateral maxillary sinuses   Cardiovascular: Regular rhythm and normal heart sounds.  Exam reveals no gallop and no friction rub.   No murmur heard. Mildly bradycardic   Pulmonary/Chest: Effort normal and breath sounds normal. No respiratory distress. She has no wheezes. She has no rales. She exhibits no tenderness.  Neurological: She is alert and oriented to person, place, and time.  Skin: She is not diaphoretic.  Psychiatric: She has a normal mood and affect. Her behavior is normal. Thought content normal.  Nursing note and vitals reviewed.         Assessment & Plan:  1. Dizziness  - CBC with Differential/Platelet - CMP14+EGFR  2. Acute sinusitis, recurrence not specified, unspecified location  - amoxicillin (AMOXIL) 875 MG tablet; Take 1 tablet (875 mg total) by mouth 2 (two) times daily.  Dispense: 20 tablet; Refill: 0 - fluticasone (FLONASE) 50 MCG/ACT nasal spray; Place 2 sprays into  both nostrils daily.  Dispense: 16 g; Refill: 6   Continue all meds Labs pending RTO if s/s worsen or dni   Amy Black A. Benjamin Stain PA-C

## 2014-10-30 LAB — CBC WITH DIFFERENTIAL/PLATELET
Basophils Absolute: 0.1 10*3/uL (ref 0.0–0.2)
Basos: 1 %
EOS (ABSOLUTE): 0.1 10*3/uL (ref 0.0–0.4)
Eos: 2 %
Hematocrit: 35.8 % (ref 34.0–46.6)
Hemoglobin: 11.5 g/dL (ref 11.1–15.9)
Immature Grans (Abs): 0 10*3/uL (ref 0.0–0.1)
Immature Granulocytes: 0 %
Lymphocytes Absolute: 2.9 10*3/uL (ref 0.7–3.1)
Lymphs: 49 %
MCH: 29.9 pg (ref 26.6–33.0)
MCHC: 32.1 g/dL (ref 31.5–35.7)
MCV: 93 fL (ref 79–97)
Monocytes Absolute: 0.5 10*3/uL (ref 0.1–0.9)
Monocytes: 9 %
Neutrophils Absolute: 2.3 10*3/uL (ref 1.4–7.0)
Neutrophils: 39 %
Platelets: 453 10*3/uL — ABNORMAL HIGH (ref 150–379)
RBC: 3.85 x10E6/uL (ref 3.77–5.28)
RDW: 14.3 % (ref 12.3–15.4)
WBC: 5.9 10*3/uL (ref 3.4–10.8)

## 2014-10-30 LAB — CMP14+EGFR
ALT: 11 IU/L (ref 0–32)
AST: 16 IU/L (ref 0–40)
Albumin/Globulin Ratio: 1.5 (ref 1.1–2.5)
Albumin: 4.4 g/dL (ref 3.5–4.8)
Alkaline Phosphatase: 106 IU/L (ref 39–117)
BUN/Creatinine Ratio: 13 (ref 11–26)
BUN: 10 mg/dL (ref 8–27)
Bilirubin Total: 0.3 mg/dL (ref 0.0–1.2)
CO2: 26 mmol/L (ref 18–29)
Calcium: 10 mg/dL (ref 8.7–10.3)
Chloride: 98 mmol/L (ref 97–108)
Creatinine, Ser: 0.78 mg/dL (ref 0.57–1.00)
GFR calc Af Amer: 88 mL/min/{1.73_m2} (ref >59)
GFR calc non Af Amer: 76 mL/min/{1.73_m2} (ref >59)
Globulin, Total: 2.9 g/dL (ref 1.5–4.5)
Glucose: 88 mg/dL (ref 65–99)
Potassium: 4.5 mmol/L (ref 3.5–5.2)
Sodium: 138 mmol/L (ref 134–144)
Total Protein: 7.3 g/dL (ref 6.0–8.5)

## 2014-11-04 ENCOUNTER — Other Ambulatory Visit: Payer: Self-pay | Admitting: *Deleted

## 2014-11-04 MED ORDER — TRIAMTERENE-HCTZ 37.5-25 MG PO TABS: 1.0000 | ORAL_TABLET | ORAL | Status: DC

## 2014-11-10 ENCOUNTER — Telehealth: Payer: Self-pay | Admitting: Cardiology

## 2014-11-10 ENCOUNTER — Other Ambulatory Visit: Payer: Self-pay

## 2014-11-10 MED ORDER — ALPRAZOLAM 0.5 MG PO TABS: 0.5000 mg | ORAL_TABLET | Freq: Three times a day (TID) | ORAL | Status: DC

## 2014-11-10 MED ORDER — TRIAMTERENE-HCTZ 37.5-25 MG PO CAPS: 1.0000 | ORAL_CAPSULE | Freq: Every day | ORAL | Status: DC

## 2014-11-10 NOTE — Telephone Encounter (Signed)
Last seen 10/29/14  Tiffany  If approved route to nurse to call into [facility]   [phone]

## 2014-11-10 NOTE — Telephone Encounter (Signed)
Pt called in stating that she had a problem at the pharmacy in regards to her Maxzide. She states that it does not work and she would like a prescription for Dyazide, this is something Dr. Dan Maker prescribed for her a long time ago and it seem to work for her. Please f/u with her   Thanks

## 2014-11-10 NOTE — Telephone Encounter (Signed)
Please call in xanax with 1 refills 

## 2014-11-10 NOTE — Telephone Encounter (Signed)
Per Dr Percival Spanish OK to change to Dyazide since pt feels as though it work better for her.  Pharmacy called and is aware. I did explain to the pt that the only difference is that one is a capsule and one is a tablet.  They are the exact same medication.   Left message for pt that RX has been called into the pharmacy and for her to call back if further questions or concerns.

## 2014-11-10 NOTE — Telephone Encounter (Signed)
Forward to Kelli Churn RN  Patient is being followed in [place] office Patient has appointment with primary 11/16/14

## 2014-11-10 NOTE — Addendum Note (Signed)
Addended by: Shellia Cleverly on: 11/10/2014 01:47 PM   Modules accepted: Orders

## 2014-11-13 NOTE — Telephone Encounter (Signed)
Called in to [facility] 

## 2014-11-16 ENCOUNTER — Encounter: Payer: Self-pay | Admitting: Family Medicine

## 2014-11-16 ENCOUNTER — Ambulatory Visit (INDEPENDENT_AMBULATORY_CARE_PROVIDER_SITE_OTHER): Payer: Medicare Other | Admitting: Family Medicine

## 2014-11-16 VITALS — BP 98/56 | HR 61 | Temp 96.8°F | Ht 63.0 in | Wt 155.2 lb

## 2014-11-16 DIAGNOSIS — R531 Weakness: Secondary | ICD-10-CM

## 2014-11-16 LAB — POCT UA - MICROSCOPIC ONLY
Casts, Ur, LPF, POC: NEGATIVE
Crystals, Ur, HPF, POC: NEGATIVE
RBC, urine, microscopic: NEGATIVE
Yeast, UA: NEGATIVE

## 2014-11-16 LAB — POCT URINALYSIS DIPSTICK
Blood, UA: NEGATIVE
Glucose, UA: NEGATIVE
Ketones, UA: NEGATIVE
Nitrite, UA: NEGATIVE
Protein, UA: NEGATIVE
Spec Grav, UA: 1.02
Urobilinogen, UA: NEGATIVE
pH, UA: 6

## 2014-11-16 MED ORDER — PREDNISONE 10 MG PO TABS: ORAL_TABLET | ORAL | Status: DC

## 2014-11-16 MED ORDER — SULFAMETHOXAZOLE-TRIMETHOPRIM 800-160 MG PO TABS: 1.0000 | ORAL_TABLET | Freq: Two times a day (BID) | ORAL | Status: DC

## 2014-11-16 NOTE — Progress Notes (Signed)
Subjective:  Patient ID: Amy Black, female    DOB: Jun 19, 1940  Age: 74 y.o. MRN: [id number]  CC: Fatigue   HPI Amy Black presents for intermittent light headedness for 6 weeks. Accompanied by sensation of things "wobbling". Yesterday started while cooking. Lasted the rest of the day. Severe HA On left a few days ago. Relief with tramadol. Started as tingling in sides, then Had HA in rapid successsion.Right leg weak from Transverse myelitis. Dx in 2014.Symptoms moderate, increasing in frequency and severity.    History Amy Black has a past medical history of CAD (coronary artery disease); Irritable bowel syndrome; Diverticulosis of colon (without mention of hemorrhage); hepatic cyst; Hyperlipidemia; Adenosquamous carcinoma; HTN (hypertension); History of cardiac catheterization; Unstable angina (HCC); Adenocarcinoma (HCC); Insomnia; Vitamin D deficiency; Adenocarcinoma of unknown primary (HCC) (02/06/2011); History of nuclear stress test (04/2011); and Coronary artery spasm (HCC).   She has past surgical history that includes Resection soft tissue tumor leg / ankle radical (2004); Diagnostic laparoscopy; Lysis of adhesion; Salpingoophorectomy (Left); Abdominal hysterectomy; Pelvic laparoscopy (1992); Cardiac catheterization; transthoracic echocardiogram (07/2012); and Cardiac catheterization (N/A, 08/28/2014).   Her family history includes Cancer in her mother; Coronary artery disease in an other family member; Diabetes in an other family member; Heart disease in her father and mother; Hypertension in her mother; Stroke in her father. There is no history of Colon cancer.She reports that she quit smoking about 42 years ago. Her smoking use included Cigarettes. She started smoking about 45 years ago. She has a 2 pack-year smoking history. She has never used smokeless tobacco. She reports that she does not drink alcohol or use illicit drugs.  Outpatient Prescriptions Prior to Visit    Medication Sig Dispense Refill  . ALPRAZolam (XANAX) 0.5 MG tablet Take 1 tablet (0.5 mg total) by mouth 3 (three) times daily. 90 tablet 1  . aspirin 81 MG tablet Take 1 tablet (81 mg total) by mouth daily. 30 tablet   . Cholecalciferol (VITAMIN D PO) Take 1 capsule by mouth daily. OTC    . cyclobenzaprine (FLEXERIL) 5 MG tablet TAKE 1 TABLET THREE TIMES DAILY AS NEEDED FOR SPASMS 30 tablet 0  . fluticasone (FLONASE) 50 MCG/ACT nasal spray Place 2 sprays into both nostrils daily. 16 g 6  . isosorbide mononitrate (IMDUR) 30 MG 24 hr tablet Take 0.5 tablets (15 mg total) by mouth daily. 30 tablet 11  . loperamide (IMODIUM) 2 MG capsule Take by mouth as needed for diarrhea or loose stools.    . meloxicam (MOBIC) 15 MG tablet Take 15 mg by mouth daily.    . nitroGLYCERIN (NITROSTAT) 0.4 MG SL tablet Place 1 tablet (0.4 mg total) under the tongue every 5 (five) minutes as needed. For chest pain 25 tablet 3  . pantoprazole (PROTONIX) 40 MG tablet Take 40 mg by mouth daily.    . traMADol (ULTRAM) 50 MG tablet TAKE ONE TABLET BY MOUTH EVERY TWELVE HOURS AS NEEDED. 30 tablet 0  . triamterene-hydrochlorothiazide (DYAZIDE) 37.5-25 MG capsule Take 1 each (1 capsule total) by mouth daily.    . verapamil (CALAN) 80 MG tablet Take 80 mg by mouth 4 (four) times daily.     . cetirizine (ZYRTEC) 10 MG tablet Take 1 tablet (10 mg total) by mouth 2 (two) times daily. (Patient not taking: Reported on 11/16/2014) 30 tablet 0  . amoxicillin (AMOXIL) 875 MG tablet Take 1 tablet (875 mg total) by mouth 2 (two) times daily. (Patient not taking: Reported on 11/16/2014) 20  tablet 0   No facility-administered medications prior to visit.    ROS Review of Systems  Objective:  BP 98/56 mmHg  Pulse 61  Temp(Src) 96.8 F (36 C) (Oral)  Ht $R'5\' 3"'Co$  (1.6 m)  Wt 155 lb 3.2 oz (70.398 kg)  BMI 27.50 kg/m2  BP Readings from Last 3 Encounters:  11/16/14 98/56  10/29/14 134/71  10/28/14 122/78    Wt Readings from Last  3 Encounters:  11/16/14 155 lb 3.2 oz (70.398 kg)  10/29/14 158 lb 12.8 oz (72.031 kg)  10/28/14 158 lb (71.668 kg)     Physical Exam  No results found for: HGBA1C  Lab Results  Component Value Date   WBC 6.5 11/16/2014   HGB 12.1 08/24/2014   HCT 37.2 11/16/2014   PLT 446.0* 08/24/2014   GLUCOSE 104* 11/16/2014   CHOL 203* 08/29/2012   TRIG 131 08/29/2012   HDL 68 08/29/2012   LDLCALC 109* 08/29/2012   ALT 10 11/16/2014   AST 15 11/16/2014   NA 140 11/16/2014   K 5.6* 11/16/2014   CL 97 11/16/2014   CREATININE 0.95 11/16/2014   BUN 10 11/16/2014   CO2 25 11/16/2014   TSH 0.911 09/10/2013   INR 1.0 08/24/2014    No results found.  Assessment & Plan:   Amy Black was seen today for fatigue.  Diagnoses and all orders for this visit:  Weakness -     POCT urinalysis dipstick -     POCT UA - Microscopic Only -     CBC with Differential/Platelet -     CMP14+EGFR -     Sedimentation rate  Other orders -     predniSONE (DELTASONE) 10 MG tablet; Take 5 daily for 3 days followed by 4,3,2 and 1 for 3 days each. -     sulfamethoxazole-trimethoprim (BACTRIM DS,SEPTRA DS) 800-160 MG tablet; Take 1 tablet by mouth 2 (two) times daily.   I have discontinued Amy Black's amoxicillin. I am also having her start on predniSONE and sulfamethoxazole-trimethoprim. Additionally, I am having her maintain her aspirin, nitroGLYCERIN, cetirizine, Cholecalciferol (VITAMIN D PO), isosorbide mononitrate, cyclobenzaprine, loperamide, meloxicam, pantoprazole, verapamil, traMADol, fluticasone, triamterene-hydrochlorothiazide, and ALPRAZolam.  Meds ordered this encounter  Medications  . predniSONE (DELTASONE) 10 MG tablet    Sig: Take 5 daily for 3 days followed by 4,3,2 and 1 for 3 days each.    Dispense:  45 tablet    Refill:  0  . sulfamethoxazole-trimethoprim (BACTRIM DS,SEPTRA DS) 800-160 MG tablet    Sig: Take 1 tablet by mouth 2 (two) times daily.    Dispense:  14 tablet    Refill:   0     Follow-up: Return in about 2 weeks (around 11/30/2014) for weakness.  [name], M.D.

## 2014-11-17 LAB — CMP14+EGFR
ALT: 10 IU/L (ref 0–32)
AST: 15 IU/L (ref 0–40)
Albumin/Globulin Ratio: 1.6 (ref 1.1–2.5)
Albumin: 4.5 g/dL (ref 3.5–4.8)
Alkaline Phosphatase: 121 IU/L — ABNORMAL HIGH (ref 39–117)
BUN/Creatinine Ratio: 11 (ref 11–26)
BUN: 10 mg/dL (ref 8–27)
Bilirubin Total: 0.4 mg/dL (ref 0.0–1.2)
CO2: 25 mmol/L (ref 18–29)
Calcium: 10.2 mg/dL (ref 8.7–10.3)
Chloride: 97 mmol/L (ref 97–108)
Creatinine, Ser: 0.95 mg/dL (ref 0.57–1.00)
GFR calc Af Amer: 69 mL/min/{1.73_m2} (ref >59)
GFR calc non Af Amer: 60 mL/min/{1.73_m2} (ref >59)
Globulin, Total: 2.9 g/dL (ref 1.5–4.5)
Glucose: 104 mg/dL — ABNORMAL HIGH (ref 65–99)
Potassium: 5.6 mmol/L — ABNORMAL HIGH (ref 3.5–5.2)
Sodium: 140 mmol/L (ref 134–144)
Total Protein: 7.4 g/dL (ref 6.0–8.5)

## 2014-11-17 LAB — CBC WITH DIFFERENTIAL/PLATELET
Basophils Absolute: 0.1 10*3/uL (ref 0.0–0.2)
Basos: 1 %
EOS (ABSOLUTE): 0.3 10*3/uL (ref 0.0–0.4)
Eos: 4 %
Hematocrit: 37.2 % (ref 34.0–46.6)
Hemoglobin: 12.5 g/dL (ref 11.1–15.9)
Immature Grans (Abs): 0 10*3/uL (ref 0.0–0.1)
Immature Granulocytes: 0 %
Lymphocytes Absolute: 2.7 10*3/uL (ref 0.7–3.1)
Lymphs: 41 %
MCH: 31.5 pg (ref 26.6–33.0)
MCHC: 33.6 g/dL (ref 31.5–35.7)
MCV: 94 fL (ref 79–97)
Monocytes Absolute: 0.6 10*3/uL (ref 0.1–0.9)
Monocytes: 10 %
Neutrophils Absolute: 2.9 10*3/uL (ref 1.4–7.0)
Neutrophils: 44 %
Platelets: 435 10*3/uL — ABNORMAL HIGH (ref 150–379)
RBC: 3.97 x10E6/uL (ref 3.77–5.28)
RDW: 14.3 % (ref 12.3–15.4)
WBC: 6.5 10*3/uL (ref 3.4–10.8)

## 2014-11-17 LAB — SEDIMENTATION RATE: Sed Rate: 10 mm/hr (ref 0–40)

## 2014-11-28 ENCOUNTER — Other Ambulatory Visit: Payer: Self-pay | Admitting: Family Medicine

## 2014-11-30 ENCOUNTER — Encounter: Payer: Self-pay | Admitting: Family Medicine

## 2014-11-30 ENCOUNTER — Ambulatory Visit (INDEPENDENT_AMBULATORY_CARE_PROVIDER_SITE_OTHER): Payer: Medicare Other | Admitting: Family Medicine

## 2014-11-30 VITALS — Ht 63.0 in

## 2014-11-30 DIAGNOSIS — K5901 Slow transit constipation: Secondary | ICD-10-CM

## 2014-11-30 DIAGNOSIS — Z1239 Encounter for other screening for malignant neoplasm of breast: Secondary | ICD-10-CM | POA: Diagnosis not present

## 2014-11-30 DIAGNOSIS — R1030 Lower abdominal pain, unspecified: Secondary | ICD-10-CM | POA: Diagnosis not present

## 2014-11-30 MED ORDER — PSYLLIUM 28 % PO PACK: 1.0000 | PACK | Freq: Two times a day (BID) | ORAL | Status: DC

## 2014-11-30 MED ORDER — POLYETHYLENE GLYCOL 3350 17 GM/SCOOP PO POWD: 17.0000 g | Freq: Two times a day (BID) | ORAL | Status: DC | PRN

## 2014-11-30 MED ORDER — DOCUSATE SODIUM 100 MG PO CAPS: 100.0000 mg | ORAL_CAPSULE | Freq: Two times a day (BID) | ORAL | Status: DC

## 2014-11-30 NOTE — Telephone Encounter (Signed)
Last filled 10/29/14, last seen 11/16/14. Rx will print

## 2014-11-30 NOTE — Telephone Encounter (Signed)
Written RX for Tramadol given to pt at appt

## 2014-11-30 NOTE — Progress Notes (Signed)
Subjective:  Patient ID: Amy Black, female    DOB: 02-Feb-1941  Age: 74 y.o. MRN: [id number]  CC: Fatigue   HPIHaving to use  [facility] Tenny presents for BLQ pain after eating. Vague BLQ discomfort and bloating. BM 1-2/week. LBM occ.Having to use miralax 2-3 X per week for constipation, but then will have LBM. Couldn't afford linzess. Pt. States the fatigue is related to the abd. Discomfort. No tenesmus or hematochezia. ONgoing sx since last office visit and before. Stable, but not improved. History  Kalisa has a past medical history of CAD (coronary artery disease); Irritable bowel syndrome; Diverticulosis of colon (without mention of hemorrhage); hepatic cyst; Hyperlipidemia; Adenosquamous carcinoma; HTN (hypertension); History of cardiac catheterization; Unstable angina ([place]); Adenocarcinoma ([place]); Insomnia; Vitamin D deficiency; Adenocarcinoma of unknown primary ([place]) (02/06/2011); History of nuclear stress test (04/2011); and Coronary artery spasm ([place]).   She has past surgical history that includes Resection soft tissue tumor leg / ankle radical (2004); Diagnostic laparoscopy; Lysis of adhesion; Salpingoophorectomy (Left); Abdominal hysterectomy; Pelvic laparoscopy (1992); Cardiac catheterization; transthoracic echocardiogram (07/2012); and Cardiac catheterization (N/A, 08/28/2014).   Her family history includes Cancer in her mother; Coronary artery disease in an other family member; Diabetes in an other family member; Heart disease in her father and mother; Hypertension in her mother; Stroke in her father. There is no history of Colon cancer.She reports that she quit smoking about 42 years ago. Her smoking use included Cigarettes. She started smoking about 46 years ago. She has a 2 pack-year smoking history. She has never used smokeless tobacco. She reports that she does not drink alcohol or use illicit drugs.  Outpatient Prescriptions Prior to Visit  Medication Sig Dispense Refill  .  ALPRAZolam (XANAX) 0.5 MG tablet Take 1 tablet (0.5 mg total) by mouth 3 (three) times daily. 90 tablet 1  . aspirin 81 MG tablet Take 1 tablet (81 mg total) by mouth daily. 30 tablet   . cetirizine (ZYRTEC) 10 MG tablet Take 1 tablet (10 mg total) by mouth 2 (two) times daily. 30 tablet 0  . Cholecalciferol (VITAMIN D PO) Take 1 capsule by mouth daily. OTC    . cyclobenzaprine (FLEXERIL) 5 MG tablet TAKE 1 TABLET THREE TIMES DAILY AS NEEDED FOR SPASMS 30 tablet 0  . fluticasone (FLONASE) 50 MCG/ACT nasal spray Place 2 sprays into both nostrils daily. 16 g 6  . isosorbide mononitrate (IMDUR) 30 MG 24 hr tablet Take 0.5 tablets (15 mg total) by mouth daily. 30 tablet 11  . loperamide (IMODIUM) 2 MG capsule Take by mouth as needed for diarrhea or loose stools.    . meloxicam (MOBIC) 15 MG tablet Take 15 mg by mouth daily.    . nitroGLYCERIN (NITROSTAT) 0.4 MG SL tablet Place 1 tablet (0.4 mg total) under the tongue every 5 (five) minutes as needed. For chest pain 25 tablet 3  . pantoprazole (PROTONIX) 40 MG tablet Take 40 mg by mouth daily.    . traMADol (ULTRAM) 50 MG tablet TAKE ONE TABLET BY MOUTH EVERY TWELVE HOURS AS NEEDED. 60 tablet 5  . triamterene-hydrochlorothiazide (DYAZIDE) 37.5-25 MG capsule Take 1 each (1 capsule total) by mouth daily.    . verapamil (CALAN) 80 MG tablet Take 80 mg by mouth 4 (four) times daily.     . predniSONE (DELTASONE) 10 MG tablet Take 5 daily for 3 days followed by 4,3,2 and 1 for 3 days each. (Patient not taking: Reported on 11/30/2014) 45 tablet 0  . sulfamethoxazole-trimethoprim (  BACTRIM DS,SEPTRA DS) 800-160 MG tablet Take 1 tablet by mouth 2 (two) times daily. (Patient not taking: Reported on 11/30/2014) 14 tablet 0   No facility-administered medications prior to visit.    ROS Review of Systems  Constitutional: Positive for activity change (decreased). Negative for fever, chills, diaphoresis, appetite change, fatigue and unexpected weight change.  HENT:  Negative for congestion, ear pain, hearing loss, postnasal drip, rhinorrhea, sneezing, sore throat and trouble swallowing.   Eyes: Negative for pain.  Respiratory: Negative for cough, chest tightness and shortness of breath.   Cardiovascular: Negative for chest pain and palpitations.  Gastrointestinal: Positive for abdominal pain and abdominal distention. Negative for nausea, vomiting, diarrhea and constipation.  Genitourinary: Negative for dysuria, frequency and menstrual problem.  Musculoskeletal: Negative for myalgias, joint swelling and arthralgias.  Skin: Negative for rash and wound.  Neurological: Negative for dizziness, weakness, numbness and headaches.  Psychiatric/Behavioral: Negative for dysphoric mood and agitation.  All other systems reviewed and are negative.   Objective:  Ht 5\' 3"  (1.6 m)  BP Readings from Last 3 Encounters:  11/16/14 98/56  10/29/14 134/71  10/28/14 122/78    Wt Readings from Last 3 Encounters:  11/16/14 155 lb 3.2 oz (70.398 kg)  10/29/14 158 lb 12.8 oz (72.031 kg)  10/28/14 158 lb (71.668 kg)     Physical Exam  Constitutional: She is oriented to person, place, and time. She appears well-developed and well-nourished. No distress.  HENT:  Head: Normocephalic and atraumatic.  Eyes: Conjunctivae are normal. Pupils are equal, round, and reactive to light.  Neck: Normal range of motion. Neck supple. No thyromegaly present.  Cardiovascular: Normal rate, regular rhythm and normal heart sounds.   No murmur heard. Pulmonary/Chest: Effort normal and breath sounds normal. No respiratory distress. She has no wheezes. She has no rales.  Abdominal: Soft. Bowel sounds are normal. She exhibits no distension. There is no tenderness.  Musculoskeletal: Normal range of motion.  Lymphadenopathy:    She has no cervical adenopathy.  Neurological: She is alert and oriented to person, place, and time.  Skin: Skin is warm and dry.  Psychiatric: She has a normal mood  and affect. Her behavior is normal. Judgment and thought content normal.    No results found for: HGBA1C  Lab Results  Component Value Date   WBC 6.5 11/16/2014   HGB 12.1 08/24/2014   HCT 37.2 11/16/2014   PLT 446.0* 08/24/2014   GLUCOSE 104* 11/16/2014   CHOL 203* 08/29/2012   TRIG 131 08/29/2012   HDL 68 08/29/2012   LDLCALC 109* 08/29/2012   ALT 10 11/16/2014   AST 15 11/16/2014   NA 140 11/16/2014   K 5.6* 11/16/2014   CL 97 11/16/2014   CREATININE 0.95 11/16/2014   BUN 10 11/16/2014   CO2 25 11/16/2014   TSH 0.911 09/10/2013   INR 1.0 08/24/2014    No results found.  Assessment & Plan:   Oddie was seen today for fatigue.  Diagnoses and all orders for this visit:  Lower abdominal pain -     psyllium (METAMUCIL SMOOTH TEXTURE) 28 % packet; Take 1 packet by mouth 2 (two) times daily. For abdominal pain and constipation -     docusate sodium (COLACE) 100 MG capsule; Take 1 capsule (100 mg total) by mouth 2 (two) times daily. -     polyethylene glycol powder (GLYCOLAX/MIRALAX) powder; Take 17 g by mouth 2 (two) times daily as needed for moderate constipation.  Slow transit constipation -  psyllium (METAMUCIL SMOOTH TEXTURE) 28 % packet; Take 1 packet by mouth 2 (two) times daily. For abdominal pain and constipation -     docusate sodium (COLACE) 100 MG capsule; Take 1 capsule (100 mg total) by mouth 2 (two) times daily. -     polyethylene glycol powder (GLYCOLAX/MIRALAX) powder; Take 17 g by mouth 2 (two) times daily as needed for moderate constipation.  Screening for breast cancer -     MM Digital Screening; Future   I have discontinued Ms. Walther's predniSONE and sulfamethoxazole-trimethoprim. I am also having her start on psyllium, docusate sodium, and polyethylene glycol powder. Additionally, I am having her maintain her aspirin, nitroGLYCERIN, cetirizine, Cholecalciferol (VITAMIN D PO), isosorbide mononitrate, cyclobenzaprine, loperamide, meloxicam,  pantoprazole, verapamil, fluticasone, triamterene-hydrochlorothiazide, ALPRAZolam, and traMADol.  Meds ordered this encounter  Medications  . psyllium (METAMUCIL SMOOTH TEXTURE) 28 % packet    Sig: Take 1 packet by mouth 2 (two) times daily. For abdominal pain and constipation    Dispense:  60 packet    Refill:  11  . docusate sodium (COLACE) 100 MG capsule    Sig: Take 1 capsule (100 mg total) by mouth 2 (two) times daily.    Dispense:  60 capsule    Refill:  11  . polyethylene glycol powder (GLYCOLAX/MIRALAX) powder    Sig: Take 17 g by mouth 2 (two) times daily as needed for moderate constipation.    Dispense:  3350 g    Refill:  11     Follow-up: Return in about 3 months (around 03/02/2015).  [name], M.D.

## 2014-12-08 ENCOUNTER — Telehealth: Payer: Self-pay | Admitting: Family Medicine

## 2014-12-08 NOTE — Telephone Encounter (Signed)
Have her discontinue the MiraLAX. She can also discontinue the Metamucil until her bowels are back to normal. However, I would like her to go back on the Metamucil once the diarrhea has past. Imodium which she already has and is available over-the-counter should be sufficient for treating the current symptom of diarrhea. Thanks, WS.

## 2014-12-08 NOTE — Telephone Encounter (Signed)
Stp and advised of md feedback, pt voiced understanding.

## 2014-12-09 ENCOUNTER — Telehealth: Payer: Self-pay | Admitting: Family Medicine

## 2014-12-09 NOTE — Telephone Encounter (Signed)
Patient will stop taking miralax and wait for a formed  stool.  Advised to take the miralax maybe every other day until she figures out how often she needs it and adjust as needed. Continue drinking fluids .

## 2014-12-11 ENCOUNTER — Other Ambulatory Visit: Payer: Self-pay | Admitting: Nurse Practitioner

## 2014-12-14 ENCOUNTER — Ambulatory Visit (INDEPENDENT_AMBULATORY_CARE_PROVIDER_SITE_OTHER): Payer: Medicare Other | Admitting: Family Medicine

## 2014-12-14 ENCOUNTER — Encounter: Payer: Self-pay | Admitting: Family Medicine

## 2014-12-14 VITALS — BP 132/75 | HR 70 | Temp 98.2°F | Ht 63.0 in | Wt 161.4 lb

## 2014-12-14 DIAGNOSIS — R109 Unspecified abdominal pain: Secondary | ICD-10-CM | POA: Insufficient documentation

## 2014-12-14 DIAGNOSIS — R1030 Lower abdominal pain, unspecified: Secondary | ICD-10-CM

## 2014-12-14 MED ORDER — DIPHENOXYLATE-ATROPINE 2.5-0.025 MG PO TABS: 1.0000 | ORAL_TABLET | Freq: Four times a day (QID) | ORAL | Status: DC | PRN

## 2014-12-14 NOTE — Progress Notes (Signed)
Subjective:  Patient ID: Amy Black, female    DOB: 1940-11-25  Age: 74 y.o. MRN: [id number]  CC: Abdominal Pain   HPI Maegen Wigle Huseman presents for fecal incontinence. Onset 1 week ago. Small amount of stool passed involuntarily several times a day. Had DCed the metamucil, colace and miralax 1 day before when constipation was resolved. Moderate abdominal discomfort associated. Patient localizes it to the left lower quadrant region.  History Pearl has a past medical history of CAD (coronary artery disease); Irritable bowel syndrome; Diverticulosis of colon (without mention of hemorrhage); hepatic cyst; Hyperlipidemia; Adenosquamous carcinoma; HTN (hypertension); History of cardiac catheterization; Unstable angina ([place]); Adenocarcinoma ([place]); Insomnia; Vitamin D deficiency; Adenocarcinoma of unknown primary ([place]) (02/06/2011); History of nuclear stress test (04/2011); and Coronary artery spasm ([place]).   She has past surgical history that includes Resection soft tissue tumor leg / ankle radical (2004); Diagnostic laparoscopy; Lysis of adhesion; Salpingoophorectomy (Left); Abdominal hysterectomy; Pelvic laparoscopy (1992); Cardiac catheterization; transthoracic echocardiogram (07/2012); and Cardiac catheterization (N/A, 08/28/2014).   Her family history includes Cancer in her mother; Coronary artery disease in an other family member; Diabetes in an other family member; Heart disease in her father and mother; Hypertension in her mother; Stroke in her father. There is no history of Colon cancer.She reports that she quit smoking about 42 years ago. Her smoking use included Cigarettes. She started smoking about 46 years ago. She has a 2 pack-year smoking history. She has never used smokeless tobacco. She reports that she does not drink alcohol or use illicit drugs.  Outpatient Prescriptions Prior to Visit  Medication Sig Dispense Refill  . ALPRAZolam (XANAX) 0.5 MG tablet TAKE ONE TABLET BY MOUTH  THREE TIMES DAILY 90 tablet 2  . aspirin 81 MG tablet Take 1 tablet (81 mg total) by mouth daily. 30 tablet   . cetirizine (ZYRTEC) 10 MG tablet Take 1 tablet (10 mg total) by mouth 2 (two) times daily. 30 tablet 0  . Cholecalciferol (VITAMIN D PO) Take 1 capsule by mouth daily. OTC    . cyclobenzaprine (FLEXERIL) 5 MG tablet TAKE 1 TABLET THREE TIMES DAILY AS NEEDED FOR SPASMS 30 tablet 0  . fluticasone (FLONASE) 50 MCG/ACT nasal spray Place 2 sprays into both nostrils daily. 16 g 6  . isosorbide mononitrate (IMDUR) 30 MG 24 hr tablet Take 0.5 tablets (15 mg total) by mouth daily. 30 tablet 11  . meloxicam (MOBIC) 15 MG tablet Take 15 mg by mouth daily.    . nitroGLYCERIN (NITROSTAT) 0.4 MG SL tablet Place 1 tablet (0.4 mg total) under the tongue every 5 (five) minutes as needed. For chest pain 25 tablet 3  . pantoprazole (PROTONIX) 40 MG tablet Take 40 mg by mouth daily.    . traMADol (ULTRAM) 50 MG tablet TAKE ONE TABLET BY MOUTH EVERY TWELVE HOURS AS NEEDED. 60 tablet 5  . triamterene-hydrochlorothiazide (DYAZIDE) 37.5-25 MG capsule Take 1 each (1 capsule total) by mouth daily.    . verapamil (CALAN) 80 MG tablet Take 80 mg by mouth 4 (four) times daily.     Marland Kitchen docusate sodium (COLACE) 100 MG capsule Take 1 capsule (100 mg total) by mouth 2 (two) times daily. 60 capsule 11  . polyethylene glycol powder (GLYCOLAX/MIRALAX) powder Take 17 g by mouth 2 (two) times daily as needed for moderate constipation. 3350 g 11  . psyllium (METAMUCIL SMOOTH TEXTURE) 28 % packet Take 1 packet by mouth 2 (two) times daily. For abdominal pain and constipation 60 packet  11  . loperamide (IMODIUM) 2 MG capsule Take by mouth as needed for diarrhea or loose stools.     No facility-administered medications prior to visit.    ROS Review of Systems  Constitutional: Positive for activity change (decreased). Negative for chills, diaphoresis and fatigue.  HENT: Negative for congestion and rhinorrhea.   Respiratory:  Negative for cough and shortness of breath.   Cardiovascular: Negative for chest pain and palpitations.  Gastrointestinal: Positive for abdominal pain and abdominal distention. Negative for nausea, vomiting, diarrhea and constipation.  Genitourinary: Negative for dysuria and frequency.  Musculoskeletal: Negative for myalgias and arthralgias.  Skin: Negative for rash and wound.  All other systems reviewed and are negative.   Objective:  BP 132/75 mmHg  Pulse 70  Temp(Src) 98.2 F (36.8 C) (Oral)  Ht $R'5\' 3"'NK$  (1.6 m)  Wt 161 lb 6.4 oz (73.211 kg)  BMI 28.60 kg/m2  SpO2 99%  BP Readings from Last 3 Encounters:  12/14/14 132/75  11/16/14 98/56  10/29/14 134/71    Wt Readings from Last 3 Encounters:  12/14/14 161 lb 6.4 oz (73.211 kg)  11/16/14 155 lb 3.2 oz (70.398 kg)  10/29/14 158 lb 12.8 oz (72.031 kg)     Physical Exam  Constitutional: She is oriented to person, place, and time. She appears well-developed and well-nourished. No distress.  HENT:  Head: Normocephalic and atraumatic.  Right Ear: External ear normal.  Left Ear: External ear normal.  Nose: Nose normal.  Mouth/Throat: Oropharynx is clear and moist.  Eyes: Conjunctivae and EOM are normal. Pupils are equal, round, and reactive to light.  Neck: Normal range of motion. Neck supple. No thyromegaly present.  Cardiovascular: Normal rate, regular rhythm and normal heart sounds.   No murmur heard. Pulmonary/Chest: Effort normal and breath sounds normal. No respiratory distress. She has no wheezes. She has no rales.  Abdominal: Soft. Bowel sounds are normal. She exhibits distension. She exhibits no mass. There is tenderness (Left flank and LLQ). There is no rebound and no guarding.  Lymphadenopathy:    She has no cervical adenopathy.  Neurological: She is alert and oriented to person, place, and time. She has normal reflexes.  Skin: Skin is warm and dry.  Psychiatric: She has a normal mood and affect. Her behavior is  normal. Judgment and thought content normal.    No results found for: HGBA1C  Lab Results  Component Value Date   WBC 4.5 12/14/2014   HGB 12.1 08/24/2014   HCT 33.5* 12/14/2014   PLT 446.0* 08/24/2014   GLUCOSE 89 12/14/2014   CHOL 203* 08/29/2012   TRIG 131 08/29/2012   HDL 68 08/29/2012   LDLCALC 109* 08/29/2012   ALT 8 12/14/2014   AST 14 12/14/2014   NA 141 12/14/2014   K 4.2 12/14/2014   CL 100 12/14/2014   CREATININE 0.74 12/14/2014   BUN 7* 12/14/2014   CO2 28 12/14/2014   TSH 0.911 09/10/2013   INR 1.0 08/24/2014    No results found.  Assessment & Plan:   Traci was seen today for abdominal pain.  Diagnoses and all orders for this visit:  Lower abdominal pain -     Cancel: CT Abdomen Pelvis W Contrast; Future -     Ambulatory referral to Gastroenterology -     CMP14+EGFR -     CBC with Differential/Platelet -     CT Abdomen Pelvis Wo Contrast; Future  Other orders -     diphenoxylate-atropine (LOMOTIL) 2.5-0.025 MG tablet; Take  1 tablet by mouth 4 (four) times daily as needed for diarrhea or loose stools.   I have discontinued Ms. Potenza's loperamide, psyllium, docusate sodium, and polyethylene glycol powder. I am also having her start on diphenoxylate-atropine. Additionally, I am having her maintain her aspirin, nitroGLYCERIN, cetirizine, Cholecalciferol (VITAMIN D PO), isosorbide mononitrate, cyclobenzaprine, meloxicam, pantoprazole, verapamil, fluticasone, triamterene-hydrochlorothiazide, traMADol, and ALPRAZolam.  Meds ordered this encounter  Medications  . diphenoxylate-atropine (LOMOTIL) 2.5-0.025 MG tablet    Sig: Take 1 tablet by mouth 4 (four) times daily as needed for diarrhea or loose stools.    Dispense:  30 tablet    Refill:  1     Follow-up: Return in about 2 weeks (around 12/28/2014) for fecal incontinence, abd pain.  [name], M.D.

## 2014-12-14 NOTE — Telephone Encounter (Signed)
rx called into pharmacy

## 2014-12-15 ENCOUNTER — Encounter: Payer: Self-pay | Admitting: Gastroenterology

## 2014-12-16 ENCOUNTER — Telehealth: Payer: Self-pay | Admitting: Family Medicine

## 2014-12-16 ENCOUNTER — Ambulatory Visit (HOSPITAL_COMMUNITY)
Admission: RE | Admit: 2014-12-16 | Discharge: 2014-12-16 | Disposition: A | Discharge status: Home / Self Care | Payer: Medicare Other | Source: Ambulatory Visit | Attending: Family Medicine | Admitting: Family Medicine

## 2014-12-16 DIAGNOSIS — R1032 Left lower quadrant pain: Secondary | ICD-10-CM | POA: Diagnosis not present

## 2014-12-16 DIAGNOSIS — R1030 Lower abdominal pain, unspecified: Secondary | ICD-10-CM

## 2014-12-16 DIAGNOSIS — K5730 Diverticulosis of large intestine without perforation or abscess without bleeding: Secondary | ICD-10-CM | POA: Diagnosis not present

## 2014-12-16 DIAGNOSIS — Z90710 Acquired absence of both cervix and uterus: Secondary | ICD-10-CM | POA: Diagnosis not present

## 2014-12-16 LAB — CBC WITH DIFFERENTIAL/PLATELET
Basophils Absolute: 0.1 10*3/uL (ref 0.0–0.2)
Basos: 1 %
EOS (ABSOLUTE): 0.1 10*3/uL (ref 0.0–0.4)
Eos: 3 %
Hematocrit: 33.5 % — ABNORMAL LOW (ref 34.0–46.6)
Hemoglobin: 10.8 g/dL — ABNORMAL LOW (ref 11.1–15.9)
Immature Grans (Abs): 0 10*3/uL (ref 0.0–0.1)
Immature Granulocytes: 0 %
Lymphocytes Absolute: 2.1 10*3/uL (ref 0.7–3.1)
Lymphs: 46 %
MCH: 30 pg (ref 26.6–33.0)
MCHC: 32.2 g/dL (ref 31.5–35.7)
MCV: 93 fL (ref 79–97)
Monocytes Absolute: 0.4 10*3/uL (ref 0.1–0.9)
Monocytes: 9 %
Neutrophils Absolute: 1.8 10*3/uL (ref 1.4–7.0)
Neutrophils: 41 %
Platelets: 444 10*3/uL — ABNORMAL HIGH (ref 150–379)
RBC: 3.6 x10E6/uL — ABNORMAL LOW (ref 3.77–5.28)
RDW: 14.6 % (ref 12.3–15.4)
WBC: 4.5 10*3/uL (ref 3.4–10.8)

## 2014-12-16 LAB — CMP14+EGFR
ALT: 8 IU/L (ref 0–32)
AST: 14 IU/L (ref 0–40)
Albumin/Globulin Ratio: 1.7 (ref 1.1–2.5)
Albumin: 4 g/dL (ref 3.5–4.8)
Alkaline Phosphatase: 92 IU/L (ref 39–117)
BUN/Creatinine Ratio: 9 — ABNORMAL LOW (ref 11–26)
BUN: 7 mg/dL — ABNORMAL LOW (ref 8–27)
Bilirubin Total: 0.3 mg/dL (ref 0.0–1.2)
CO2: 28 mmol/L (ref 18–29)
Calcium: 9.3 mg/dL (ref 8.7–10.3)
Chloride: 100 mmol/L (ref 97–106)
Creatinine, Ser: 0.74 mg/dL (ref 0.57–1.00)
GFR calc Af Amer: 94 mL/min/{1.73_m2} (ref >59)
GFR calc non Af Amer: 81 mL/min/{1.73_m2} (ref >59)
Globulin, Total: 2.4 g/dL (ref 1.5–4.5)
Glucose: 89 mg/dL (ref 65–99)
Potassium: 4.2 mmol/L (ref 3.5–5.2)
Sodium: 141 mmol/L (ref 136–144)
Total Protein: 6.4 g/dL (ref 6.0–8.5)

## 2014-12-17 ENCOUNTER — Other Ambulatory Visit: Payer: Self-pay | Admitting: Family Medicine

## 2014-12-17 MED ORDER — CIPROFLOXACIN HCL 500 MG PO TABS: 500.0000 mg | ORAL_TABLET | Freq: Two times a day (BID) | ORAL | Status: DC

## 2014-12-17 MED ORDER — METRONIDAZOLE 500 MG PO TABS: 500.0000 mg | ORAL_TABLET | Freq: Two times a day (BID) | ORAL | Status: DC

## 2014-12-23 NOTE — Addendum Note (Signed)
Addended by: Michaela Corner on: 12/23/2014 11:12 AM   Modules accepted: Orders

## 2014-12-23 NOTE — Progress Notes (Signed)
Patient aware.

## 2014-12-29 NOTE — Telephone Encounter (Signed)
Patient aware of results.

## 2014-12-30 ENCOUNTER — Other Ambulatory Visit: Payer: Self-pay | Admitting: *Deleted

## 2014-12-30 MED ORDER — VERAPAMIL HCL 80 MG PO TABS: 80.0000 mg | ORAL_TABLET | Freq: Four times a day (QID) | ORAL | Status: DC

## 2015-01-01 ENCOUNTER — Other Ambulatory Visit: Payer: Self-pay | Admitting: Internal Medicine

## 2015-01-04 NOTE — Telephone Encounter (Signed)
Rx(s) sent to pharmacy electronically.  

## 2015-01-07 ENCOUNTER — Other Ambulatory Visit: Payer: Self-pay | Admitting: Family Medicine

## 2015-01-15 ENCOUNTER — Other Ambulatory Visit: Payer: Self-pay | Admitting: Physician Assistant

## 2015-01-18 ENCOUNTER — Other Ambulatory Visit (HOSPITAL_BASED_OUTPATIENT_CLINIC_OR_DEPARTMENT_OTHER): Payer: Medicare Other

## 2015-01-18 ENCOUNTER — Ambulatory Visit (HOSPITAL_BASED_OUTPATIENT_CLINIC_OR_DEPARTMENT_OTHER): Payer: Medicare Other | Admitting: Family

## 2015-01-18 ENCOUNTER — Encounter: Payer: Self-pay | Admitting: Family

## 2015-01-18 VITALS — BP 146/67 | HR 64 | Temp 98.2°F | Resp 18 | Ht 63.0 in | Wt 155.0 lb

## 2015-01-18 DIAGNOSIS — D473 Essential (hemorrhagic) thrombocythemia: Secondary | ICD-10-CM

## 2015-01-18 DIAGNOSIS — D509 Iron deficiency anemia, unspecified: Secondary | ICD-10-CM

## 2015-01-18 DIAGNOSIS — D75839 Thrombocytosis, unspecified: Secondary | ICD-10-CM

## 2015-01-18 DIAGNOSIS — Z862 Personal history of diseases of the blood and blood-forming organs and certain disorders involving the immune mechanism: Secondary | ICD-10-CM

## 2015-01-18 DIAGNOSIS — C801 Malignant (primary) neoplasm, unspecified: Secondary | ICD-10-CM

## 2015-01-18 DIAGNOSIS — Z859 Personal history of malignant neoplasm, unspecified: Secondary | ICD-10-CM | POA: Diagnosis not present

## 2015-01-18 LAB — IRON AND TIBC
%SAT: 29 % (ref 21–57)
Iron: 67 ug/dL (ref 41–142)
TIBC: 231 ug/dL — ABNORMAL LOW (ref 236–444)
UIBC: 164 ug/dL (ref 120–384)

## 2015-01-18 LAB — COMPREHENSIVE METABOLIC PANEL
ALT: <9 U/L (ref 0–55)
AST: 12 U/L (ref 5–34)
Albumin: 3.9 g/dL (ref 3.5–5.0)
Alkaline Phosphatase: 101 U/L (ref 40–150)
Anion Gap: 8 mEq/L (ref 3–11)
BUN: 14.6 mg/dL (ref 7.0–26.0)
CO2: 27 mEq/L (ref 22–29)
Calcium: 9.4 mg/dL (ref 8.4–10.4)
Chloride: 104 mEq/L (ref 98–109)
Creatinine: 0.8 mg/dL (ref 0.6–1.1)
EGFR: 84 mL/min/{1.73_m2} — ABNORMAL LOW (ref >90)
Glucose: 87 mg/dl (ref 70–140)
Potassium: 4.1 mEq/L (ref 3.5–5.1)
Sodium: 140 mEq/L (ref 136–145)
Total Bilirubin: 0.47 mg/dL (ref 0.20–1.20)
Total Protein: 7.3 g/dL (ref 6.4–8.3)

## 2015-01-18 LAB — CBC WITH DIFFERENTIAL (CANCER CENTER ONLY)
BASO#: 0 10*3/uL (ref 0.0–0.2)
BASO%: 0.8 % (ref 0.0–2.0)
EOS%: 4.2 % (ref 0.0–7.0)
Eosinophils Absolute: 0.2 10*3/uL (ref 0.0–0.5)
HCT: 37.6 % (ref 34.8–46.6)
HGB: 12.2 g/dL (ref 11.6–15.9)
LYMPH#: 1.3 10*3/uL (ref 0.9–3.3)
LYMPH%: 25.4 % (ref 14.0–48.0)
MCH: 30.7 pg (ref 26.0–34.0)
MCHC: 32.4 g/dL (ref 32.0–36.0)
MCV: 95 fL (ref 81–101)
MONO#: 0.6 10*3/uL (ref 0.1–0.9)
MONO%: 11.2 % (ref 0.0–13.0)
NEUT#: 3 10*3/uL (ref 1.5–6.5)
NEUT%: 58.4 % (ref 39.6–80.0)
Platelets: 355 10*3/uL (ref 145–400)
RBC: 3.98 10*6/uL (ref 3.70–5.32)
RDW: 13.3 % (ref 11.1–15.7)
WBC: 5.2 10*3/uL (ref 3.9–10.0)

## 2015-01-18 LAB — FERRITIN: Ferritin: 82 ng/ml (ref 9–269)

## 2015-01-18 NOTE — Progress Notes (Signed)
Hematology and Oncology Follow Up Visit  Amy Gant Ambers MS:[id number] 26-Jun-1940 74 y.o. 01/18/2015   Principle Diagnosis:  Transient thrombocytosis History transverse myelitis History of adenosquamous carcinoma of unknown primary  Current Therapy:   Observation    Interim History:  Amy Black is here today for a follow-up. She is doing well and has no complaints at this time.  There has been no issue with infections. No fever, chills, n/v, cough, rash, dizziness, SOB, chest pain, palpitations, abdominal pain or changes in bowel or bladder habits.  No lymphadenopathy found on exam. No episodes of bleeding or bruising.  She is eating well and staying hydrated. Her weight is stable.  She has myelitis which does effect the right leg at times. She has done well with PT and will start another session soon. She has done well with this and is able to ambulate with a cane. No falls.  No c/o joint or "boney" pain.   Medications:    Medication List       This list is accurate as of: 01/18/15  2:19 PM.  Always use your most recent med list.               ALPRAZolam 0.5 MG tablet  Commonly known as:  XANAX  TAKE ONE TABLET BY MOUTH THREE TIMES DAILY     aspirin 81 MG tablet  Take 1 tablet (81 mg total) by mouth daily.     cetirizine 10 MG tablet  Commonly known as:  ZYRTEC  Take 1 tablet (10 mg total) by mouth 2 (two) times daily.     ciprofloxacin 500 MG tablet  Commonly known as:  CIPRO  Take 1 tablet (500 mg total) by mouth 2 (two) times daily.     cyclobenzaprine 5 MG tablet  Commonly known as:  FLEXERIL  TAKE 1 TABLET THREE TIMES DAILY AS NEEDED FOR SPASMS     diphenoxylate-atropine 2.5-0.025 MG tablet  Commonly known as:  LOMOTIL  Take 1 tablet by mouth 4 (four) times daily as needed for diarrhea or loose stools.     fluticasone 50 MCG/ACT nasal spray  Commonly known as:  FLONASE  Place 2 sprays into both nostrils daily.     isosorbide mononitrate 30 MG 24 hr  tablet  Commonly known as:  IMDUR  Take 0.5 tablets (15 mg total) by mouth daily.     meloxicam 15 MG tablet  Commonly known as:  MOBIC  Take 15 mg by mouth daily.     meloxicam 15 MG tablet  Commonly known as:  MOBIC  TAKE ONE TABLET BY MOUTH DAILY     metroNIDAZOLE 500 MG tablet  Commonly known as:  FLAGYL  Take 1 tablet (500 mg total) by mouth 2 (two) times daily.     NITROSTAT 0.4 MG SL tablet  Generic drug:  nitroGLYCERIN  DISSOLVE ONE TABLET UNDER TONGUE EVERY 5 MINUTES UP TO 3 DOSES AS NEEDED FOR CHEST PAIN     pantoprazole 40 MG tablet  Commonly known as:  PROTONIX  Take 40 mg by mouth daily.     traMADol 50 MG tablet  Commonly known as:  ULTRAM  TAKE ONE TABLET BY MOUTH EVERY TWELVE HOURS AS NEEDED.     triamterene-hydrochlorothiazide 37.5-25 MG capsule  Commonly known as:  DYAZIDE  Take 1 each (1 capsule total) by mouth daily.     verapamil 80 MG tablet  Commonly known as:  CALAN  Take 1 tablet (80 mg total) by mouth  4 (four) times daily.     VITAMIN D PO  Take 1 capsule by mouth daily. OTC        Allergies:  Allergies  Allergen Reactions  . Cymbalta [Duloxetine Hcl] Other (See Comments)    sleepiness  . Gabapentin Other (See Comments)    sleepiness  . Iodine Itching  . Ivp Dye [Iodinated Diagnostic Agents] Itching  . Zanaflex [Tizanidine Hcl] Other (See Comments)    Very drunk feeling next day    Past Medical History, Surgical history, Social history, and Family History were reviewed and updated.  Review of Systems: All other 10 point review of systems is negative.   Physical Exam:  height is 5\' 3"  (1.6 m) and weight is 155 lb (70.308 kg). Her oral temperature is 98.2 F (36.8 C). Her blood pressure is 146/67 and her pulse is 64. Her respiration is 18.   Wt Readings from Last 3 Encounters:  01/18/15 155 lb (70.308 kg)  12/14/14 161 lb 6.4 oz (73.211 kg)  11/16/14 155 lb 3.2 oz (70.398 kg)    Ocular: Sclerae unicteric, pupils equal,  round and reactive to light Ear-nose-throat: Oropharynx clear, dentition fair Lymphatic: No cervical supraclavicular or axillary adenopathy Lungs no rales or rhonchi, good excursion bilaterally Heart regular rate and rhythm, no murmur appreciated Abd soft, nontender, positive bowel sounds MSK no focal spinal tenderness, no joint edema Neuro: non-focal, well-oriented, appropriate affect Breasts: Deferred  Lab Results  Component Value Date   WBC 5.2 01/18/2015   HGB 12.2 01/18/2015   HCT 37.6 01/18/2015   MCV 95 01/18/2015   PLT 355 01/18/2015   Lab Results  Component Value Date   FERRITIN 82 01/18/2015   IRON 67 01/18/2015   TIBC 231* 01/18/2015   UIBC 164 01/18/2015   IRONPCTSAT 29 01/18/2015   Lab Results  Component Value Date   RETICCTPCT 1.6 09/10/2013   RBC 3.98 01/18/2015   No results found for: KPAFRELGTCHN, LAMBDASER, KAPLAMBRATIO No results found for: IGGSERUM, IGA, IGMSERUM No results found for: Odetta Pink, SPEI   Chemistry      Component Value Date/Time   NA 140 01/18/2015 1115   NA 141 12/14/2014 1827   NA 136 08/24/2014 1615   NA 140 07/20/2014 1131   K 4.1 01/18/2015 1115   K 4.2 12/14/2014 1827   K 4.6 07/20/2014 1131   CL 100 12/14/2014 1827   CL 101 07/20/2014 1131   CO2 27 01/18/2015 1115   CO2 28 12/14/2014 1827   CO2 32 07/20/2014 1131   BUN 14.6 01/18/2015 1115   BUN 7* 12/14/2014 1827   BUN 16 08/24/2014 1615   BUN 10 07/20/2014 1131   CREATININE 0.8 01/18/2015 1115   CREATININE 0.74 12/14/2014 1827   CREATININE 0.9 07/20/2014 1131      Component Value Date/Time   CALCIUM 9.4 01/18/2015 1115   CALCIUM 9.3 12/14/2014 1827   CALCIUM 9.8 07/20/2014 1131   ALKPHOS 101 01/18/2015 1115   ALKPHOS 92 12/14/2014 1827   ALKPHOS 97* 07/20/2014 1131   AST 12 01/18/2015 1115   AST 14 12/14/2014 1827   AST 23 07/20/2014 1131   ALT <9 01/18/2015 1115   ALT 8 12/14/2014 1827   ALT 19  07/20/2014 1131   BILITOT 0.47 01/18/2015 1115   BILITOT 0.3 12/14/2014 1827   BILITOT 0.50 07/20/2014 1131   BILITOT 0.6 01/23/2014 1148     Impression and Plan: Amy Black is a 74 year old African  American female with history of transient thrombocytosis and also history of adenosquamous carcinoma with unknown primary. So far, there is no evidence of recurrent malignancy. She is doing well and is asymptomatic at this time.  Her platelet count is now 355. No anemia. CBC today is normal.  We will plan to see her back in 6 months for labs and follow-up.  She will contact us with any questions or concerns. We can certainly see her sooner if need be.   Eliezer Bottom, NP 12/12/20162:19 PM

## 2015-01-19 ENCOUNTER — Encounter: Payer: Self-pay | Admitting: Family Medicine

## 2015-01-19 ENCOUNTER — Ambulatory Visit (INDEPENDENT_AMBULATORY_CARE_PROVIDER_SITE_OTHER): Payer: Medicare Other | Admitting: Family Medicine

## 2015-01-19 VITALS — BP 128/78 | HR 62 | Temp 97.2°F | Ht 63.0 in | Wt 155.6 lb

## 2015-01-19 DIAGNOSIS — R05 Cough: Secondary | ICD-10-CM | POA: Diagnosis not present

## 2015-01-19 DIAGNOSIS — J40 Bronchitis, not specified as acute or chronic: Secondary | ICD-10-CM | POA: Diagnosis not present

## 2015-01-19 DIAGNOSIS — R059 Cough, unspecified: Secondary | ICD-10-CM

## 2015-01-19 DIAGNOSIS — J329 Chronic sinusitis, unspecified: Secondary | ICD-10-CM | POA: Diagnosis not present

## 2015-01-19 DIAGNOSIS — J029 Acute pharyngitis, unspecified: Secondary | ICD-10-CM

## 2015-01-19 LAB — POCT INFLUENZA A/B
Influenza A, POC: NEGATIVE
Influenza B, POC: NEGATIVE

## 2015-01-19 LAB — POCT RAPID STREP A (OFFICE): Rapid Strep A Screen: NEGATIVE

## 2015-01-19 MED ORDER — PSEUDOEPHEDRINE-GUAIFENESIN ER 60-600 MG PO TB12: 1.0000 | ORAL_TABLET | Freq: Two times a day (BID) | ORAL | Status: AC

## 2015-01-19 MED ORDER — AMOXICILLIN-POT CLAVULANATE 875-125 MG PO TABS: 1.0000 | ORAL_TABLET | Freq: Two times a day (BID) | ORAL | Status: DC

## 2015-01-19 NOTE — Progress Notes (Signed)
Subjective:  Patient ID: Amy Black, female    DOB: 06-27-1940  Age: 74 y.o. MRN: [id number]  CC: URI   HPI Amy Black presents for Patient presents with upper respiratory congestion. Rhinorrhea that is frequently purulent. There is moderate sore throat. Patient reports coughing green sputum frequently as well. Onset was with chills and fever. The patient denies being short of breath. Onset was 3 days ago. Gradually worsening in spite of home remedies such as guaifenisin.   History Amy Black has a past medical history of CAD (coronary artery disease); Irritable bowel syndrome; Diverticulosis of colon (without mention of hemorrhage); hepatic cyst; Hyperlipidemia; Adenosquamous carcinoma; HTN (hypertension); History of cardiac catheterization; Unstable angina ([place]); Adenocarcinoma ([place]); Insomnia; Vitamin D deficiency; Adenocarcinoma of unknown primary ([place]) (02/06/2011); History of nuclear stress test (04/2011); and Coronary artery spasm ([place]).   She has past surgical history that includes Resection soft tissue tumor leg / ankle radical (2004); Diagnostic laparoscopy; Lysis of adhesion; Salpingoophorectomy (Left); Abdominal hysterectomy; Pelvic laparoscopy (1992); Cardiac catheterization; transthoracic echocardiogram (07/2012); and Cardiac catheterization (N/A, 08/28/2014).   Her family history includes Cancer in her mother; Coronary artery disease in an other family member; Diabetes in an other family member; Heart disease in her father and mother; Hypertension in her mother; Stroke in her father. There is no history of Colon cancer.She reports that she quit smoking about 42 years ago. Her smoking use included Cigarettes. She started smoking about 46 years ago. She has a 2 pack-year smoking history. She has never used smokeless tobacco. She reports that she does not drink alcohol or use illicit drugs.  Current Outpatient Prescriptions on File Prior to Visit  Medication Sig Dispense Refill    . ALPRAZolam (XANAX) 0.5 MG tablet TAKE ONE TABLET BY MOUTH THREE TIMES DAILY 90 tablet 2  . aspirin 81 MG tablet Take 1 tablet (81 mg total) by mouth daily. 30 tablet   . cetirizine (ZYRTEC) 10 MG tablet Take 1 tablet (10 mg total) by mouth 2 (two) times daily. 30 tablet 0  . Cholecalciferol (VITAMIN D PO) Take 1 capsule by mouth daily. OTC    . cyclobenzaprine (FLEXERIL) 5 MG tablet TAKE 1 TABLET THREE TIMES DAILY AS NEEDED FOR SPASMS 30 tablet 0  . diphenoxylate-atropine (LOMOTIL) 2.5-0.025 MG tablet Take 1 tablet by mouth 4 (four) times daily as needed for diarrhea or loose stools. 30 tablet 1  . fluticasone (FLONASE) 50 MCG/ACT nasal spray Place 2 sprays into both nostrils daily. 16 g 6  . isosorbide mononitrate (IMDUR) 30 MG 24 hr tablet Take 0.5 tablets (15 mg total) by mouth daily. 30 tablet 11  . meloxicam (MOBIC) 15 MG tablet Take 15 mg by mouth daily.    . meloxicam (MOBIC) 15 MG tablet TAKE ONE TABLET BY MOUTH DAILY 30 tablet 2  . NITROSTAT 0.4 MG SL tablet DISSOLVE ONE TABLET UNDER TONGUE EVERY 5 MINUTES UP TO 3 DOSES AS NEEDED FOR CHEST PAIN 25 tablet 1  . pantoprazole (PROTONIX) 40 MG tablet Take 40 mg by mouth daily.    . traMADol (ULTRAM) 50 MG tablet TAKE ONE TABLET BY MOUTH EVERY TWELVE HOURS AS NEEDED. 60 tablet 5  . triamterene-hydrochlorothiazide (DYAZIDE) 37.5-25 MG capsule Take 1 each (1 capsule total) by mouth daily.    . verapamil (CALAN) 80 MG tablet Take 1 tablet (80 mg total) by mouth 4 (four) times daily. 115 tablet 3   No current facility-administered medications on file prior to visit.    ROS Review  of Systems  Constitutional: Negative for fever, chills, activity change and appetite change.  HENT: Positive for congestion, postnasal drip, rhinorrhea and sinus pressure (around eyes and frontal, bilateral). Negative for ear discharge, ear pain, hearing loss, nosebleeds, sneezing and trouble swallowing.   Respiratory: Positive for cough. Negative for chest  tightness and shortness of breath.   Cardiovascular: Negative for chest pain and palpitations.  Skin: Negative for rash.    Objective:  BP 128/78 mmHg  Pulse 62  Temp(Src) 97.2 F (36.2 C) (Oral)  Ht 5\' 3"  (1.6 m)  Wt 155 lb 9.6 oz (70.58 kg)  BMI 27.57 kg/m2  SpO2 99%  Physical Exam  Constitutional: She appears well-developed and well-nourished.  HENT:  Head: Normocephalic and atraumatic.  Right Ear: Tympanic membrane and external ear normal. No decreased hearing is noted.  Left Ear: Tympanic membrane and external ear normal. No decreased hearing is noted.  Nose: Mucosal edema (with moderate erythema and exudate) and sinus tenderness present. Right sinus exhibits frontal sinus tenderness. Left sinus exhibits frontal sinus tenderness.  Mouth/Throat: No oropharyngeal exudate or posterior oropharyngeal erythema.  Neck: No Brudzinski's sign noted.  Cardiovascular: Normal rate, regular rhythm and normal heart sounds.   Pulmonary/Chest: No respiratory distress. She has wheezes (few, scattered).  Lymphadenopathy:       Head (right side): No preauricular adenopathy present.       Head (left side): No preauricular adenopathy present.       Right cervical: No superficial cervical adenopathy present.      Left cervical: No superficial cervical adenopathy present.    Assessment & Plan:   Amy Black was seen today for uri.  Diagnoses and all orders for this visit:  Cough -     POCT rapid strep A -     POCT Influenza A/B  Sore throat -     POCT rapid strep A -     POCT Influenza A/B  Sinobronchitis  Other orders -     amoxicillin-clavulanate (AUGMENTIN) 875-125 MG tablet; Take 1 tablet by mouth 2 (two) times daily. Take all of this medication -     pseudoephedrine-guaifenesin (MUCINEX D) 60-600 MG 12 hr tablet; Take 1 tablet by mouth every 12 (twelve) hours. As needed for congestion   I have discontinued Amy Black's ciprofloxacin and metroNIDAZOLE. I am also having her start on  amoxicillin-clavulanate and pseudoephedrine-guaifenesin. Additionally, I am having her maintain her aspirin, cetirizine, Cholecalciferol (VITAMIN D PO), isosorbide mononitrate, meloxicam, pantoprazole, fluticasone, triamterene-hydrochlorothiazide, traMADol, ALPRAZolam, diphenoxylate-atropine, verapamil, NITROSTAT, meloxicam, and cyclobenzaprine.  Meds ordered this encounter  Medications  . amoxicillin-clavulanate (AUGMENTIN) 875-125 MG tablet    Sig: Take 1 tablet by mouth 2 (two) times daily. Take all of this medication    Dispense:  20 tablet    Refill:  0  . pseudoephedrine-guaifenesin (MUCINEX D) 60-600 MG 12 hr tablet    Sig: Take 1 tablet by mouth every 12 (twelve) hours. As needed for congestion    Dispense:  20 tablet    Refill:  0     Follow-up: Return if symptoms worsen or fail to improve. [name], M.D.

## 2015-02-05 DIAGNOSIS — Z1231 Encounter for screening mammogram for malignant neoplasm of breast: Secondary | ICD-10-CM | POA: Diagnosis not present

## 2015-02-05 LAB — HM MAMMOGRAPHY

## 2015-02-10 DIAGNOSIS — M4726 Other spondylosis with radiculopathy, lumbar region: Secondary | ICD-10-CM | POA: Diagnosis not present

## 2015-02-10 DIAGNOSIS — M549 Dorsalgia, unspecified: Secondary | ICD-10-CM | POA: Diagnosis not present

## 2015-02-10 DIAGNOSIS — M5416 Radiculopathy, lumbar region: Secondary | ICD-10-CM | POA: Diagnosis not present

## 2015-02-10 DIAGNOSIS — M5136 Other intervertebral disc degeneration, lumbar region: Secondary | ICD-10-CM | POA: Diagnosis not present

## 2015-02-11 ENCOUNTER — Other Ambulatory Visit: Payer: Self-pay | Admitting: Orthopaedic Surgery

## 2015-02-11 DIAGNOSIS — M5136 Other intervertebral disc degeneration, lumbar region: Secondary | ICD-10-CM

## 2015-02-15 ENCOUNTER — Ambulatory Visit: Payer: Medicare Other | Admitting: Gastroenterology

## 2015-02-18 ENCOUNTER — Other Ambulatory Visit: Payer: Medicare Other

## 2015-02-18 DIAGNOSIS — N1330 Unspecified hydronephrosis: Secondary | ICD-10-CM | POA: Diagnosis not present

## 2015-02-18 DIAGNOSIS — Z0000 Encounter for general adult medical examination without abnormal findings: Secondary | ICD-10-CM | POA: Diagnosis not present

## 2015-02-18 DIAGNOSIS — R338 Other retention of urine: Secondary | ICD-10-CM | POA: Diagnosis not present

## 2015-02-19 ENCOUNTER — Encounter: Payer: Self-pay | Admitting: *Deleted

## 2015-02-23 ENCOUNTER — Ambulatory Visit
Admission: RE | Admit: 2015-02-23 | Discharge: 2015-02-23 | Disposition: A | Discharge status: Home / Self Care | Payer: Medicare Other | Source: Ambulatory Visit | Attending: Orthopaedic Surgery | Admitting: Orthopaedic Surgery

## 2015-02-23 DIAGNOSIS — M5126 Other intervertebral disc displacement, lumbar region: Secondary | ICD-10-CM | POA: Diagnosis not present

## 2015-02-23 DIAGNOSIS — M5136 Other intervertebral disc degeneration, lumbar region: Secondary | ICD-10-CM

## 2015-02-23 DIAGNOSIS — M545 Low back pain: Secondary | ICD-10-CM | POA: Diagnosis not present

## 2015-02-23 MED ORDER — IOHEXOL 180 MG/ML  SOLN
15.0000 mL | Freq: Once | INTRAMUSCULAR | Status: AC | PRN
  Administered 2015-02-23: 15 mL via INTRATHECAL

## 2015-02-23 MED ORDER — DIAZEPAM 5 MG PO TABS
5.0000 mg | ORAL_TABLET | Freq: Once | ORAL | Status: AC
  Administered 2015-02-23: 5 mg via ORAL

## 2015-02-23 NOTE — Discharge Instructions (Signed)
Myelogram Discharge Instructions  1. Go home and rest quietly for the next 24 hours.  It is important to lie flat for the next 24 hours.  Get up only to go to the restroom.  You may lie in the bed or on a couch on your back, your stomach, your left side or your right side.  You may have one pillow under your head.  You may have pillows between your knees while you are on your side or under your knees while you are on your back.  2. DO NOT drive today.  Recline the seat as far back as it will go, while still wearing your seat belt, on the way home.  3. You may get up to go to the bathroom as needed.  You may sit up for 10 minutes to eat.  You may resume your normal diet and medications unless otherwise indicated.  Drink lots of extra fluids today and tomorrow.  4. The incidence of headache, nausea, or vomiting is about 5% (one in 20 patients).  If you develop a headache, lie flat and drink plenty of fluids until the headache goes away.  Caffeinated beverages may be helpful.  If you develop severe nausea and vomiting or a headache that does not go away with flat bed rest, call [phone].  5. You may resume normal activities after your 24 hours of bed rest is over; however, do not exert yourself strongly or do any heavy lifting tomorrow. If when you get up you have a headache when standing, go back to bed and force fluids for another 24 hours.  6. Call your physician for a follow-up appointment.  The results of your myelogram will be sent directly to your physician by the following day.  7. If you have any questions or if complications develop after you arrive home, please call [phone].  Discharge instructions have been explained to the patient.  The patient, or the person responsible for the patient, fully understands these instructions.       May resume Tramadol on Jan. 18, 2017, after 1:00 pm.

## 2015-02-23 NOTE — Progress Notes (Signed)
Patient states she has been off Tramadol for at least the past two days. 

## 2015-02-24 ENCOUNTER — Other Ambulatory Visit: Payer: Self-pay | Admitting: Family Medicine

## 2015-02-25 NOTE — Telephone Encounter (Signed)
Last seen 01/19/15 Dr Livia Snellen  Last Vit D level 09/10/13  27.7

## 2015-03-02 ENCOUNTER — Ambulatory Visit: Payer: Medicare Other | Admitting: Family Medicine

## 2015-03-10 ENCOUNTER — Other Ambulatory Visit: Payer: Self-pay | Admitting: Family Medicine

## 2015-03-10 DIAGNOSIS — M4726 Other spondylosis with radiculopathy, lumbar region: Secondary | ICD-10-CM | POA: Diagnosis not present

## 2015-03-10 DIAGNOSIS — M5136 Other intervertebral disc degeneration, lumbar region: Secondary | ICD-10-CM | POA: Diagnosis not present

## 2015-03-10 DIAGNOSIS — M5416 Radiculopathy, lumbar region: Secondary | ICD-10-CM | POA: Diagnosis not present

## 2015-03-22 ENCOUNTER — Telehealth: Payer: Self-pay | Admitting: Family Medicine

## 2015-03-22 DIAGNOSIS — M5136 Other intervertebral disc degeneration, lumbar region: Secondary | ICD-10-CM | POA: Diagnosis not present

## 2015-03-22 NOTE — Telephone Encounter (Signed)
Called with the approx date to be done, pt ok

## 2015-03-29 ENCOUNTER — Ambulatory Visit: Payer: Medicare Other | Attending: Rehabilitation | Admitting: Physical Therapy

## 2015-03-29 ENCOUNTER — Encounter: Payer: Self-pay | Admitting: Physical Therapy

## 2015-03-29 DIAGNOSIS — M25671 Stiffness of right ankle, not elsewhere classified: Secondary | ICD-10-CM | POA: Insufficient documentation

## 2015-03-29 DIAGNOSIS — M25551 Pain in right hip: Secondary | ICD-10-CM | POA: Diagnosis not present

## 2015-03-29 DIAGNOSIS — R29898 Other symptoms and signs involving the musculoskeletal system: Secondary | ICD-10-CM

## 2015-03-29 DIAGNOSIS — R29818 Other symptoms and signs involving the nervous system: Secondary | ICD-10-CM | POA: Diagnosis not present

## 2015-03-29 DIAGNOSIS — R262 Difficulty in walking, not elsewhere classified: Secondary | ICD-10-CM

## 2015-03-29 DIAGNOSIS — R2689 Other abnormalities of gait and mobility: Secondary | ICD-10-CM

## 2015-03-29 NOTE — Therapy (Signed)
[facility] [place], [place], [address] Phone: [phone]   Fax:  [phone]  Physical Therapy Evaluation  Patient Details  Name: Amy Black MRN: [id number] Date of Birth: Oct 08, 1940 Referring Provider: Zonia Kief, MD  Encounter Date: 03/29/2015      PT End of Session - 03/29/15 1351    Visit Number 1   Number of Visits 12   Date for PT Re-Evaluation 05/10/15   PT Start Time 1351   PT Stop Time 1436   PT Time Calculation (min) 45 min   Activity Tolerance Patient tolerated treatment well   Behavior During Therapy [facility] for tasks assessed/performed      Past Medical History  Diagnosis Date  . CAD (coronary artery disease)   . Irritable bowel syndrome   . Diverticulosis of colon (without mention of hemorrhage)   . hepatic cyst   . Hyperlipidemia   . Adenosquamous carcinoma     left leg 2004 - radiation & resection  . HTN (hypertension)     PT. DENIES  . History of cardiac catheterization   . Unstable angina ([place])   . Adenocarcinoma (H[facility])     LEFT LEG  . Insomnia   . Vitamin D deficiency   . Adenocarcinoma of unknown primary ([place]) 02/06/2011  . History of nuclear stress test 04/2011    lexiscan; normal study, no significant ischemia, low risk   . Coronary artery spasm [facility])     Past Surgical History  Procedure Laterality Date  . Resection soft tissue tumor leg / ankle radical  2004    leg lesion resection & radiation  . Diagnostic laparoscopy    . Lysis of adhesion    . Salpingoophorectomy Left   . Abdominal hysterectomy    . Pelvic laparoscopy  1992    RSO, AND LSO ON 2007  . Cardiac catheterization      coronary spasm  . Transthoracic echocardiogram  07/2012    LV cavity size mildly reduced, normal wall motion; MV with calcified annulus and mild MR; LA mildly dilated; atrial septum with increased thickness - lipomatous hypertrophy; RV systolic pressure increased (borderline pulm HTN)  .  Cardiac catheterization N/A 08/28/2014    Procedure: Left Heart Cath and Coronary Angiography;  Surgeon: Jettie Booze, MD;  Location: [facility];  Service: Cardiovascular;  Laterality: N/A;    There were no vitals filed for this visit.  Visit Diagnosis:  Right leg weakness - Plan: PT plan of care cert/re-cert  Difficulty walking - Plan: PT plan of care cert/re-cert  Loss of balance - Plan: PT plan of care cert/re-cert  Pain in joint of right pelvic region or thigh - Plan: PT plan of care cert/re-cert      Subjective Assessment - 03/29/15 1353    Subjective Patient presents today with concerns regarding her balance. Patient uses a SPC at all times. 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 She drags the RLE.    Patient Stated Goals Improve my balance and hamstring strength.   Currently in Pain? No/denies  patient reports pain with lying down in R leg            [facility] PT Assessment - 03/29/15 0001    Assessment   Medical Diagnosis Acute Transverse Myelitis   Referring Provider Zonia Kief, MD   Onset Date/Surgical Date 02/04/13   Next MD Visit 04/12/15   Precautions   Precaution Comments FALL RISK  Balance Screen   Has the patient fallen in the past 6 months Yes   How many times? 1   Has the patient had a decrease in activity level because of a fear of falling?  Yes   Is the patient reluctant to leave their home because of a fear of falling?  No   Home Environment   Living Environment Private residence   Type of [place] Access Stairs to enter   Entrance Stairs-Number of Steps 2   Entrance Stairs-Rails Can reach both   Home Layout Two level   [place] - single point   Prior Function   Level of Independence Independent  with AD   ROM / Strength   AROM / PROM / Strength Strength   AROM   Right Ankle Dorsiflexion 5   Strength   Overall Strength Comments LLE 5/5; R knee ext 5/5   Right/Left Hip Right    Right Hip Flexion 4/5  in available range   Right Hip Extension 4-/5   Right Hip ABduction 4/5   Right Hip ADduction 5/5   Right Knee Flexion 3+/5   Right Ankle Dorsiflexion 5/5   Flexibility   Soft Tissue Assessment /Muscle Length --  tightness B HF and quads, R gastroc   Palpation   Palpation comment tenderness of R quads, ITB and HS   Ambulation/Gait   Ambulation Distance (Feet) 60 Feet   Assistive device Straight cane   Gait Pattern Right circumduction;Right hip hike;Right foot flat   Ambulation Surface Level   Berg Balance Test   Sit to Stand Able to stand without using hands and stabilize independently   Standing Unsupported Able to stand safely 2 minutes   Sitting with Back Unsupported but Feet Supported on Floor or Stool Able to sit safely and securely 2 minutes   Stand to Sit Sits safely with minimal use of hands   Transfers Able to transfer safely, minor use of hands   Standing Unsupported with Eyes Closed Able to stand 10 seconds safely   Standing Ubsupported with Feet Together Able to place feet together independently and stand 1 minute safely   From Standing, Reach Forward with Outstretched Arm Can reach confidently >25 cm (10")   From Standing Position, Pick up Object from Floor Able to pick up shoe safely and easily   From Standing Position, Turn to Look Behind Over each Shoulder Looks behind from both sides and weight shifts well   Turn 360 Degrees Able to turn 360 degrees safely in 4 seconds or less   Standing Unsupported, Alternately Place Feet on Step/Stool Able to stand independently and complete 8 steps >20 seconds   Standing Unsupported, One Foot in Front Able to take small step independently and hold 30 seconds   Standing on One Leg Able to lift leg independently and hold equal to or more than 3 seconds   Total Score 51                           PT Education - 03/29/15 1434    Education Details HEP   Person(s) Educated Patient   Methods  Explanation;Demonstration;Handout   Comprehension Verbalized understanding;Returned demonstration          PT Short Term Goals - 03/29/15 1448    PT SHORT TERM GOAL #1   Title I with initial HEP   Time 2   Period Weeks   Status New  PT SHORT TERM GOAL #2   Title patient able to amb with normal heel strike.   Time 2   Period Weeks   Status New           PT Long Term Goals - 04/04/2015 1450    PT LONG TERM GOAL #1   Title improved RLE strength to 4+/5 to normalize gait.   Time 6   Period Weeks   Status New   PT LONG TERM GOAL #2   Title improve BERG score to 54/56   Time 6   Period Weeks   Status New   PT LONG TERM GOAL #3   Title Able to ambulate with normal swing phase and heel strike using SPC   Time 6   Period Weeks   Status New   PT LONG TERM GOAL #4   Title Able to climb stairs with a reciprocal gait pattern.   Time 6   Period Weeks   Status New   PT LONG TERM GOAL #5   Title Decreased pain at night by 50%.   Time 6   Period Weeks   Status New               Plan - April 04, 2015 1442    Clinical Impression Statement Patient presents with RLE weakness due to transverse myelitiis and which is affecting her gait including stairs and balance. She also has tenderness of the right thigh and reports pain in this region with lying down. She has tighness in BLE limiting ROM as well.   Pt will benefit from skilled therapeutic intervention in order to improve on the following deficits Abnormal gait;Decreased range of motion;Pain;Decreased balance;Decreased strength   Rehab Potential Good   PT Frequency 2x / week   PT Duration 6 weeks   PT Treatment/Interventions Electrical Stimulation;Moist Heat;Gait training;Stair training;Therapeutic exercise;Manual techniques;Patient/family education;Passive range of motion;Neuromuscular re-education;Balance training   PT Next Visit Plan RLE strengthening (hip flex/ext/abd; knee flex); flexibility B quads/HF; R gastroc; gait  and stair training; manual to R thigh and modalities for pain prn.   PT Home Exercise Plan prone quad stretch, knee to chest (for strengthening not stretch), runners stretch   Consulted and Agree with Plan of Care Patient          G-Codes - 04/04/2015 1453    Functional Assessment Tool Used Clinical Judgement   Functional Limitation Mobility: Walking and moving around   Mobility: Walking and Moving Around Current Status [phone]) At least 20 percent but less than 40 percent impaired, limited or restricted   Mobility: Walking and Moving Around Goal Status [phone]) At least 20 percent but less than 40 percent impaired, limited or restricted       Problem List Patient Active Problem List   Diagnosis Date Noted  . Abdominal pain 12/14/2014  . Exertional angina ([facility])   . Baker's cyst of knee 02/14/2013  . Coronary artery spasm ([place]) 11/19/2012  . Transverse myelitis ([place]) 10/10/2012  . HTN (hypertension) 08/29/2012  . Arthritis of neck ([place]) 08/29/2012  . Urinary retention 02/20/2012  . Leg weakness 02/20/2012  . Adenocarcinoma of unknown primary ([place]) 02/06/2011  . Anxiety 05/19/2010  . CAD 12/03/2009  . GERD 12/03/2009   Madelyn Flavors PT  04/04/15, 3:01 PM  [facility] [address] [place], [place], [address] Phone: [phone]   Fax:  [phone]  Name: Amy Black MRN: [id number] Date of Birth: [birth year]

## 2015-04-01 ENCOUNTER — Ambulatory Visit: Payer: Medicare Other | Admitting: Physical Therapy

## 2015-04-01 ENCOUNTER — Encounter: Payer: Self-pay | Admitting: Physical Therapy

## 2015-04-01 DIAGNOSIS — R262 Difficulty in walking, not elsewhere classified: Secondary | ICD-10-CM | POA: Diagnosis not present

## 2015-04-01 DIAGNOSIS — R29898 Other symptoms and signs involving the musculoskeletal system: Secondary | ICD-10-CM

## 2015-04-01 DIAGNOSIS — M25671 Stiffness of right ankle, not elsewhere classified: Secondary | ICD-10-CM | POA: Diagnosis not present

## 2015-04-01 DIAGNOSIS — M25551 Pain in right hip: Secondary | ICD-10-CM | POA: Diagnosis not present

## 2015-04-01 DIAGNOSIS — R2689 Other abnormalities of gait and mobility: Secondary | ICD-10-CM

## 2015-04-01 DIAGNOSIS — R29818 Other symptoms and signs involving the nervous system: Secondary | ICD-10-CM | POA: Diagnosis not present

## 2015-04-01 NOTE — Therapy (Signed)
[facility] [place], [place], [address] Phone: [phone]   Fax:  [phone]  Physical Therapy Treatment  Patient Details  Name: Amy Black MRN: [id number] Date of Birth: 11/29/1940 Referring Provider: Zonia Kief, MD  Encounter Date: 04/01/2015      PT End of Session - 04/01/15 1521    Visit Number 2   Number of Visits 12   Date for PT Re-Evaluation 05/10/15   PT Start Time [id number]   PT Stop Time 1608   PT Time Calculation (min) 51 min   Activity Tolerance Patient tolerated treatment well   Behavior During Therapy [facility] for tasks assessed/performed      Past Medical History  Diagnosis Date  . CAD (coronary artery disease)   . Irritable bowel syndrome   . Diverticulosis of colon (without mention of hemorrhage)   . hepatic cyst   . Hyperlipidemia   . Adenosquamous carcinoma     left leg 2004 - radiation & resection  . HTN (hypertension)     PT. DENIES  . History of cardiac catheterization   . Unstable angina ([place])   . Adenocarcinoma (HCC)     LEFT LEG  . Insomnia   . Vitamin D deficiency   . Adenocarcinoma of unknown primary ([place]) 02/06/2011  . History of nuclear stress test 04/2011    lexiscan; normal study, no significant ischemia, low risk   . Coronary artery spasm [facility])     Past Surgical History  Procedure Laterality Date  . Resection soft tissue tumor leg / ankle radical  2004    leg lesion resection & radiation  . Diagnostic laparoscopy    . Lysis of adhesion    . Salpingoophorectomy Left   . Abdominal hysterectomy    . Pelvic laparoscopy  1992    RSO, AND LSO ON 2007  . Cardiac catheterization      coronary spasm  . Transthoracic echocardiogram  07/2012    LV cavity size mildly reduced, normal wall motion; MV with calcified annulus and mild MR; LA mildly dilated; atrial septum with increased thickness - lipomatous hypertrophy; RV systolic pressure increased (borderline pulm HTN)  . Cardiac  catheterization N/A 08/28/2014    Procedure: Left Heart Cath and Coronary Angiography;  Surgeon: Jettie Booze, MD;  Location: [place] [facility];  Service: Cardiovascular;  Laterality: N/A;    There were no vitals filed for this visit.  Visit Diagnosis:  Right leg weakness  Difficulty walking  Loss of balance  Pain in joint of right pelvic region or thigh      Subjective Assessment - 04/01/15 1526    Subjective "I want results this time. This is the third time i've been to therapy for this. I need to get R leg stronger and balance."   Patient Stated Goals Improve my balance and hamstring strength.   Currently in Pain? Yes   Pain Score 5    Pain Location Leg   Pain Orientation Right;Upper            [facility] PT Assessment - 04/01/15 0001    Assessment   Medical Diagnosis Acute Transverse Myelitis   Onset Date/Surgical Date 02/04/13   Next MD Visit 04/12/15   Precautions   Precaution Comments FALL RISK                     [place] Adult PT Treatment/Exercise - 04/01/15 0001    Exercises   Exercises Knee/Hip;Ankle   Knee/Hip  Exercises: Aerobic   Nustep L5 x7 min   Knee/Hip Exercises: Machines for Strengthening   Cybex Knee Extension 10# 3x10 reps   Cybex Knee Flexion 20# 3x10 reps   Knee/Hip Exercises: Supine   Heel Slides AROM;Right;2 sets;10 reps   Bridges 2 sets;10 reps   Straight Leg Raises Strengthening;Right;2 sets;10 reps  Experienced R inner thigh/groin pain   Knee/Hip Exercises: Sidelying   Hip ABduction Strengthening;Right;2 sets;10 reps             Balance Exercises - 04/01/15 1551    Balance Exercises: Standing   Tandem Stance Eyes open;Upper extremity support 1;Foam/compliant surface  x3 min   Rockerboard Anterior/posterior;Lateral;EO;Other time (comment);Intermittent UE support  x4 min ant/post; x3 min lat   Sidestepping Foam/compliant support;Other reps (comment)  x3 min eyes closed; 1 UE support   Marching Limitations on  airex to 8" step x3 min           PT Education - 04/01/15 1538    Education provided Yes   Education Details HEP- bridges, SLR, hip abduction   Person(s) Educated Patient   Methods Explanation;Demonstration;Verbal cues;Handout   Comprehension Verbalized understanding;Returned demonstration;Verbal cues required          PT Short Term Goals - 03/29/15 1448    PT SHORT TERM GOAL #1   Title I with initial HEP   Time 2   Period Weeks   Status New   PT SHORT TERM GOAL #2   Title patient able to amb with normal heel strike.   Time 2   Period Weeks   Status New           PT Long Term Goals - 03/29/15 1450    PT LONG TERM GOAL #1   Title improved RLE strength to 4+/5 to normalize gait.   Time 6   Period Weeks   Status New   PT LONG TERM GOAL #2   Title improve BERG score to 54/56   Time 6   Period Weeks   Status New   PT LONG TERM GOAL #3   Title Able to ambulate with normal swing phase and heel strike using SPC   Time 6   Period Weeks   Status New   PT LONG TERM GOAL #4   Title Able to climb stairs with a reciprocal gait pattern.   Time 6   Period Weeks   Status New   PT LONG TERM GOAL #5   Title Decreased pain at night by 50%.   Time 6   Period Weeks   Status New               Plan - 04/01/15 1611    Clinical Impression Statement Patient tolerated today's treatment fairly well today although she arrived at the clinic with R thigh pain. Patient ambulates with SPC with antalgic gait secondary RLE complications such as weakness and instability. Patient required self assist in raising RLE onto plinth table for supine strengthening exercises today. Patient demonstrated weakness in R hip flexion as well as R knee flexion with heel slides. Patient requires self assist in manipulating RLE positioning between flexion and extension for exercises. Experienced R inner thigh and groin pain with SLR but did not experience that pain with any other exercise completed  today. MPT communicated with patient regarding current issues and suggested transferring to neurorehabilitation center in order to recieve higher level balance training. Patient was in agreement secondary to being in therapy with current issues three times  with no positive results. Patient completed balance exercises fairly well with greatest difficulty noted with rockerboard in both anterior/posterior as well as lateral. Patient's BLE were observed as internally rotating with toe taps to 8" step and RLE demonstrated decreased R hip flexion.  Patient continues to experienced tightness in R thigh and experience R thigh pain that she states rises upward.   Pt will benefit from skilled therapeutic intervention in order to improve on the following deficits Abnormal gait;Decreased range of motion;Pain;Decreased balance;Decreased strength   Rehab Potential Good   PT Frequency 2x / week   PT Duration 6 weeks   PT Treatment/Interventions Electrical Stimulation;Moist Heat;Gait training;Stair training;Therapeutic exercise;Manual techniques;Patient/family education;Passive range of motion;Neuromuscular re-education;Balance training   PT Next Visit Plan Consider transferring to neurorehabilitation center for advanced balance training such as Balance Master   PT Home Exercise Plan prone quad stretch, knee to chest (for strengthening not stretch), runners stretch; SLR, bridging, hip abduction   Consulted and Agree with Plan of Care Patient        Problem List Patient Active Problem List   Diagnosis Date Noted  . Abdominal pain 12/14/2014  . Exertional angina (HCC)   . Baker's cyst of knee 02/14/2013  . Coronary artery spasm ([place]) 11/19/2012  . Transverse myelitis ([place]) 10/10/2012  . HTN (hypertension) 08/29/2012  . Arthritis of neck ([place]) 08/29/2012  . Urinary retention 02/20/2012  . Leg weakness 02/20/2012  . Adenocarcinoma of unknown primary ([place]) 02/06/2011  . Anxiety 05/19/2010  . CAD 12/03/2009   . GERD 12/03/2009    Wynelle Fanny, PTA 04/01/2015, 4:25 PM  [facility] [address] [place], [place], [address] Phone: [phone]   Fax:  [phone]  Name: Amy Black MRN: [id number] Date of Birth: 06-Feb-1941

## 2015-04-01 NOTE — Patient Instructions (Signed)
Straight Leg Raise    Tighten stomach and slowly raise locked right leg _5___ inches from floor. Repeat __10__ times per set. Do _2___ sets per session. Do _2-3___ sessions per day.  http://orth.exer.us/1102   Copyright  VHI. All rights reserved.  Strengthening: Hip Abduction (Side-Lying)    Tighten muscles on front of left thigh, then lift leg __5__ inches from surface, keeping knee locked.  Repeat _10___ times per set. Do __2__ sets per session. Do _2-3___ sessions per day.  http://orth.exer.us/622   Copyright  VHI. All rights reserved.  Bridging    Slowly raise buttocks from floor, keeping stomach tight. Repeat __10__ times per set. Do _2___ sets per session. Do __2-3__ sessions per day.  http://orth.exer.us/1096   Copyright  VHI. All rights reserved.

## 2015-04-02 ENCOUNTER — Other Ambulatory Visit: Payer: Self-pay | Admitting: *Deleted

## 2015-04-02 MED ORDER — VERAPAMIL HCL 80 MG PO TABS: 80.0000 mg | ORAL_TABLET | Freq: Four times a day (QID) | ORAL | Status: DC

## 2015-04-03 NOTE — Therapy (Signed)
[facility] [place], [place], [address] Phone: [phone]   Fax:  [phone]  Physical Therapy Treatment  Patient Details  Name: Tammara Massing Kendricks MRN: [id number] Date of Birth: Nov 08, 1940 Referring Provider: Zonia Kief, MD  Encounter Date: 08/11/2014    Past Medical History  Diagnosis Date  . CAD (coronary artery disease)   . Irritable bowel syndrome   . Diverticulosis of colon (without mention of hemorrhage)   . hepatic cyst   . Hyperlipidemia   . Adenosquamous carcinoma     left leg 2004 - radiation & resection  . HTN (hypertension)     PT. DENIES  . History of cardiac catheterization   . Unstable angina ([place])   . Adenocarcinoma (HCC)     LEFT LEG  . Insomnia   . Vitamin D deficiency   . Adenocarcinoma of unknown primary ([place]) 02/06/2011  . History of nuclear stress test 04/2011    lexiscan; normal study, no significant ischemia, low risk   . Coronary artery spasm [facility])     Past Surgical History  Procedure Laterality Date  . Resection soft tissue tumor leg / ankle radical  2004    leg lesion resection & radiation  . Diagnostic laparoscopy    . Lysis of adhesion    . Salpingoophorectomy Left   . Abdominal hysterectomy    . Pelvic laparoscopy  1992    RSO, AND LSO ON 2007  . Cardiac catheterization      coronary spasm  . Transthoracic echocardiogram  07/2012    LV cavity size mildly reduced, normal wall motion; MV with calcified annulus and mild MR; LA mildly dilated; atrial septum with increased thickness - lipomatous hypertrophy; RV systolic pressure increased (borderline pulm HTN)  . Cardiac catheterization N/A 08/28/2014    Procedure: Left Heart Cath and Coronary Angiography;  Surgeon: Jettie Booze, MD;  Location: [place] [facility];  Service: Cardiovascular;  Laterality: N/A;    There were no vitals filed for this visit.  Visit Diagnosis:  Loss of balance  Weakness  Difficulty  walking  Ankle stiffness, right  Right leg weakness  Myelopathy                                 PT Short Term Goals - 03/29/15 1448    PT SHORT TERM GOAL #1   Title I with initial HEP   Time 2   Period Weeks   Status New   PT SHORT TERM GOAL #2   Title patient able to amb with normal heel strike.   Time 2   Period Weeks   Status New           PT Long Term Goals - 03/29/15 1450    PT LONG TERM GOAL #1   Title improved RLE strength to 4+/5 to normalize gait.   Time 6   Period Weeks   Status New   PT LONG TERM GOAL #2   Title improve BERG score to 54/56   Time 6   Period Weeks   Status New   PT LONG TERM GOAL #3   Title Able to ambulate with normal swing phase and heel strike using SPC   Time 6   Period Weeks   Status New   PT LONG TERM GOAL #4   Title Able to climb stairs with a reciprocal gait pattern.   Time 6   Period  Weeks   Status New   PT LONG TERM GOAL #5   Title Decreased pain at night by 50%.   Time 6   Period Weeks   Status New               Problem List Patient Active Problem List   Diagnosis Date Noted  . Abdominal pain 12/14/2014  . Exertional angina (HCC)   . Baker's cyst of knee 02/14/2013  . Coronary artery spasm ([place]) 11/19/2012  . Transverse myelitis ([place]) 10/10/2012  . HTN (hypertension) 08/29/2012  . Arthritis of neck ([place]) 08/29/2012  . Urinary retention 02/20/2012  . Leg weakness 02/20/2012  . Adenocarcinoma of unknown primary ([place]) 02/06/2011  . Anxiety 05/19/2010  . CAD 12/03/2009  . GERD 12/03/2009   PHYSICAL THERAPY DISCHARGE SUMMARY  Visits from Start of Care: 10  Current functional level related to goals / functional outcomes: Please see above.   Remaining deficits: No goals met.   Education / Equipment: HEP.  Plan: Patient agrees to discharge.  Patient goals were not met. Patient is being discharged due to lack of progress.  ?????       [place], [place]  MPT 04/03/2015, 5:27 PM  [facility] [address] [place], [place], [address] Phone: [phone]   Fax:  [phone]  Name: Mertis Mosher Notaro MRN: [id number] Date of Birth: April 17, 1940

## 2015-04-05 ENCOUNTER — Ambulatory Visit: Payer: Medicare Other | Admitting: Rehabilitation

## 2015-04-05 ENCOUNTER — Encounter: Payer: Self-pay | Admitting: Rehabilitation

## 2015-04-05 ENCOUNTER — Encounter: Payer: Medicare Other | Admitting: Physical Therapy

## 2015-04-05 DIAGNOSIS — M25671 Stiffness of right ankle, not elsewhere classified: Secondary | ICD-10-CM | POA: Diagnosis not present

## 2015-04-05 DIAGNOSIS — R29898 Other symptoms and signs involving the musculoskeletal system: Secondary | ICD-10-CM

## 2015-04-05 DIAGNOSIS — M25551 Pain in right hip: Secondary | ICD-10-CM | POA: Diagnosis not present

## 2015-04-05 DIAGNOSIS — R262 Difficulty in walking, not elsewhere classified: Secondary | ICD-10-CM | POA: Diagnosis not present

## 2015-04-05 DIAGNOSIS — R29818 Other symptoms and signs involving the nervous system: Secondary | ICD-10-CM | POA: Diagnosis not present

## 2015-04-05 DIAGNOSIS — R2689 Other abnormalities of gait and mobility: Secondary | ICD-10-CM

## 2015-04-05 NOTE — Patient Instructions (Addendum)
Abduction: Side Leg Lift (Eccentric) - Side-Lying    Lie on side. Lift top leg slightly higher than shoulder level and think about bringing your leg back behind you slightly (this will increase the amount of muscle activation in buttock). Keep top leg straight with body, toes pointing forward. Slowly lower for 3-5 seconds. _10__ reps per set, _1-2__ sets per day, _7__ days per week.  As   http://ecce.exer.us/63   Copyright  VHI. All rights reserved.   Hip Flexor Stretch    Lying on back near edge of bed, bend one leg, foot flat. Hang other leg over edge, relaxed, thigh resting entirely on bed for __2__ minutes. Repeat _2___ times. Do _2-3___ sessions per day. Advanced Exercise: Bend knee back keeping thigh in contact with bed.  http://gt2.exer.us/347   Copyright  VHI. All rights reserved.   Adductor Stretch - Supine    Lie on bed, knees bent, feet flat. Keeping feet together, lower knees toward floor until stretch felt at inner thighs.  You can add pressure to the right leg by pushing with your right hand.  Should be slightly uncomfortable but not painful.  Repeat _2__ times. Do _2-3__ times per day.  Copyright  VHI. All rights reserved.     "I love a Database administrator    Stand by a counter top for support.  March in place as high and slow as you can.  Repeat _10___ times. Do __1-2_ sessions per day.  http://gt2.exer.us/345   Copyright  VHI. All rights reserved.   Ankle Sideward Movement (Inversion / Eversion)    Sitting or lying with feet on floor, keep knee still and rock foot onto outer edge. Return to resting position. Now rock foot onto inner edge. Repeat with other foot, then with both feet. Repeat _10___ times. Do __1-2__ sessions per day.  http://gt2.exer.us/406   Copyright  VHI. All rights reserved.   Stand facing your counters in the kitchen.  Open the bottom cabinet.  I want you to work on picking your right leg up (so bending your hip and knee) and  tapping your HEEL to the inside of the cabinet.  Then pick your leg back up (bending the hip and knee) and back to the ground.  Perform x 10 reps.

## 2015-04-05 NOTE — Therapy (Signed)
[facility] [address] [place], [place], [address] Phone: [phone]   Fax:  [phone]  Physical Therapy Treatment  Patient Details  Name: Amy Black MRN: [id number] Date of Birth: 1940-07-23 Referring Provider: Zonia Kief, MD  Encounter Date: 04/05/2015      PT End of Session - 04/05/15 1324    Visit Number 3   Number of Visits 12   Date for PT Re-Evaluation 05/10/15   PT Start Time 1316   PT Stop Time 1400   PT Time Calculation (min) 44 min   Activity Tolerance Patient tolerated treatment well   Behavior During Therapy [facility] for tasks assessed/performed      Past Medical History  Diagnosis Date  . CAD (coronary artery disease)   . Irritable bowel syndrome   . Diverticulosis of colon (without mention of hemorrhage)   . hepatic cyst   . Hyperlipidemia   . Adenosquamous carcinoma     left leg 2004 - radiation & resection  . HTN (hypertension)     PT. DENIES  . History of cardiac catheterization   . Unstable angina ([place])   . Adenocarcinoma (HCC)     LEFT LEG  . Insomnia   . Vitamin D deficiency   . Adenocarcinoma of unknown primary ([place]) 02/06/2011  . History of nuclear stress test 04/2011    lexiscan; normal study, no significant ischemia, low risk   . Coronary artery spasm [facility])     Past Surgical History  Procedure Laterality Date  . Resection soft tissue tumor leg / ankle radical  2004    leg lesion resection & radiation  . Diagnostic laparoscopy    . Lysis of adhesion    . Salpingoophorectomy Left   . Abdominal hysterectomy    . Pelvic laparoscopy  1992    RSO, AND LSO ON 2007  . Cardiac catheterization      coronary spasm  . Transthoracic echocardiogram  07/2012    LV cavity size mildly reduced, normal wall motion; MV with calcified annulus and mild MR; LA mildly dilated; atrial septum with increased thickness - lipomatous hypertrophy; RV systolic pressure increased (borderline pulm  HTN)  . Cardiac catheterization N/A 08/28/2014    Procedure: Left Heart Cath and Coronary Angiography;  Surgeon: Jettie Booze, MD;  Location: [place] [facility];  Service: Cardiovascular;  Laterality: N/A;    There were no vitals filed for this visit.  Visit Diagnosis:  Right leg weakness  Difficulty walking  Loss of balance  Ankle stiffness, right      Subjective Assessment - 04/05/15 1322    Subjective "I'm really wanting to work on this R leg, its still weak and it hurts in the thigh."     Currently in Pain? No/denies                RLE strength: hip flex 3+/5, knee ext 5/5, knee flex 3+/5, ankle DF 4/5, PF 3+/5, hip abd 3+/5 when tested in SL distally, 4/5 when tested SL proximally.    Tone:  In seated, pt seemed to have slight catch during knee flexion, however pt had difficulty fully following command and wanted to self assist therapist during testing.    NMR:  Provided pt with HEP for NMR in RLE, see pt instruction for details.  Also addressed gait with and without device x 230' total at min A level (for facilitation) with cues for posture, increased R hip and knee flexion for improved clearance (  as she tends to keep RLE locked into extension and compensates with slight circumduction).  She is able to isolate hip and knee flex during exercise and when in standing, however she is unable to perform while ambulating.  Unsure if this is proprioception or sensation deficit and that pt feels more stable with RLE in extension.  Will continue to address.  Discussed very frankly with pt regarding that results may be limited due to time period out from diagnosis.  Pt verbalized understanding, however feel that she continues to want to "fix" issues.                   PT Education - 04/05/15 1323    Education provided Yes   Education Details goals of therapy, HEP   Person(s) Educated Patient   Methods Explanation   Comprehension Verbalized understanding           PT Short Term Goals - 03/29/15 1448    PT SHORT TERM GOAL #1   Title I with initial HEP   Time 2   Period Weeks   Status New   PT SHORT TERM GOAL #2   Title patient able to amb with normal heel strike.   Time 2   Period Weeks   Status New           PT Long Term Goals - 03/29/15 1450    PT LONG TERM GOAL #1   Title improved RLE strength to 4+/5 to normalize gait.   Time 6   Period Weeks   Status New   PT LONG TERM GOAL #2   Title improve BERG score to 54/56   Time 6   Period Weeks   Status New   PT LONG TERM GOAL #3   Title Able to ambulate with normal swing phase and heel strike using SPC   Time 6   Period Weeks   Status New   PT LONG TERM GOAL #4   Title Able to climb stairs with a reciprocal gait pattern.   Time 6   Period Weeks   Status New   PT LONG TERM GOAL #5   Title Decreased pain at night by 50%.   Time 6   Period Weeks   Status New               Plan - 04/05/15 1324    Clinical Impression Statement Skilled session focused on assessment of pts current strength and deficits, providing pt with HEP for deficits and gait training to carryover what was worked on during [employer] exercise.  Educated pt that results may be limited due to time period out from diagnosis, however feel that her balance and stability can be improved upon.     Pt will benefit from skilled therapeutic intervention in order to improve on the following deficits Abnormal gait;Decreased range of motion;Pain;Decreased balance;Decreased strength   Rehab Potential Good   PT Frequency 2x / week   PT Duration 6 weeks   PT Treatment/Interventions Electrical Stimulation;Moist Heat;Gait training;Stair training;Therapeutic exercise;Manual techniques;Patient/family education;Passive range of motion;Neuromuscular re-education;Balance training;Orthotic Fit/Training;DME Instruction;Functional mobility training;Therapeutic activities   PT Next Visit Plan address R LE stiffness  (proprioception/sensation deficits?), high level balance, glute and hip strength in RLE   PT Home Exercise Plan see pt instruction from 04/05/15   Consulted and Agree with Plan of Care Patient        Problem List Patient Active Problem List   Diagnosis Date Noted  . Abdominal pain 12/14/2014  .  Exertional angina (HCC)   . Baker's cyst of knee 02/14/2013  . Coronary artery spasm ([place]) 11/19/2012  . Transverse myelitis ([place]) 10/10/2012  . HTN (hypertension) 08/29/2012  . Arthritis of neck ([place]) 08/29/2012  . Urinary retention 02/20/2012  . Leg weakness 02/20/2012  . Adenocarcinoma of unknown primary ([place]) 02/06/2011  . Anxiety 05/19/2010  . CAD 12/03/2009  . GERD 12/03/2009   Cameron Sprang, PT, MPT [facility] [address] [place], [place], [address] Phone: [phone]   Fax:  [phone] 04/06/2015, 8:23 AM  Name: Amy Black MRN: [id number] Date of Birth: [birth year]

## 2015-04-08 ENCOUNTER — Encounter: Payer: Medicare Other | Admitting: Physical Therapy

## 2015-04-08 ENCOUNTER — Encounter: Payer: Self-pay | Admitting: Pediatrics

## 2015-04-08 ENCOUNTER — Ambulatory Visit (INDEPENDENT_AMBULATORY_CARE_PROVIDER_SITE_OTHER): Payer: Medicare Other | Admitting: Pediatrics

## 2015-04-08 VITALS — BP 131/80 | HR 83 | Temp 98.8°F | Ht 63.0 in | Wt 154.8 lb

## 2015-04-08 DIAGNOSIS — R05 Cough: Secondary | ICD-10-CM

## 2015-04-08 DIAGNOSIS — R059 Cough, unspecified: Secondary | ICD-10-CM

## 2015-04-08 DIAGNOSIS — J069 Acute upper respiratory infection, unspecified: Secondary | ICD-10-CM

## 2015-04-08 MED ORDER — GUAIFENESIN-CODEINE 100-10 MG/5ML PO SOLN: 5.0000 mL | Freq: Every evening | ORAL | Status: DC | PRN

## 2015-04-08 NOTE — Progress Notes (Signed)
    Subjective:    Patient ID: Geriyah Gaccione Endo, female    DOB: September 06, 1940, 75 y.o.   MRN: [id number]  CC: Cough; Abdominal Pain; and Back Pain   HPI: Ghina Mehrhoff Mayorga is a 75 y.o. female presenting for Cough; Abdominal Pain; and Back Pain  Ongoing URI symptoms past 4 days Has a scratchy throat Cough keeping her awake at night Doesn't think she is having fevers Non-productive cough Some sinus drainage Taking tylenol twice a day, mucinex as needed, hasnt helped much   Depression screen [facility] 2/9 11/30/2014 11/16/2014 09/23/2014 06/17/2014 03/27/2014  Decreased Interest 0 0 0 0 0  Down, Depressed, Hopeless 0 0 0 0 0  PHQ - 2 Score 0 0 0 0 0     Relevant past medical, surgical, family and social history reviewed and updated as indicated. Interim medical history since our last visit reviewed. Allergies and medications reviewed and updated.    ROS: Per HPI unless specifically indicated above  History  Smoking status  . Former Smoker -- 0.50 packs/day for 4 years  . Types: Cigarettes  . Start date: 12/07/1968  . Quit date: 11/20/1972  Smokeless tobacco  . Never Used    Comment: quit 40 years ago    Past Medical History Patient Active Problem List   Diagnosis Date Noted  . Abdominal pain 12/14/2014  . Exertional angina (HCC)   . Baker's cyst of knee 02/14/2013  . Coronary artery spasm ([place]) 11/19/2012  . Transverse myelitis ([place]) 10/10/2012  . HTN (hypertension) 08/29/2012  . Arthritis of neck ([place]) 08/29/2012  . Urinary retention 02/20/2012  . Leg weakness 02/20/2012  . Adenocarcinoma of unknown primary ([place]) 02/06/2011  . Anxiety 05/19/2010  . CAD 12/03/2009  . GERD 12/03/2009        Objective:    BP 131/80 mmHg  Pulse 83  Temp(Src) 98.8 F (37.1 C) (Oral)  Ht 5\' 3"  (1.6 m)  Wt 154 lb 12.8 oz (70.217 kg)  BMI 27.43 kg/m2  Wt Readings from Last 3 Encounters:  04/08/15 154 lb 12.8 oz (70.217 kg)  01/19/15 155 lb 9.6 oz (70.58 kg)  01/18/15 155 lb (70.308  kg)     Gen: NAD, alert, cooperative with exam, NCAT EYES: EOMI, no scleral injection or icterus ENT:  TMs pearly gray b/l with no effusion, OP with mild erythema LYMPH: no cervical LAD CV: NRRR, normal S1/S2, distal pulses 2+ b/l Resp: CTABL, no wheezes, normal WOB Abd: +BS, soft, NTND. no guarding or organomegaly Ext: No edema, warm Neuro: Alert and oriented, strength equal b/l UE and LE, coordination grossly normal MSK: normal muscle bulk     Assessment & Plan:    Aroush was seen today for URI symptoms, due to acute viral URI. No indications for antibiotics. Take cough syrup with codeine, do not take with other medicines that make her sleepy, pt voiced understanding, discussed return precautions, other symptomatic care.  Diagnoses and all orders for this visit:  Cough -     guaiFENesin-codeine 100-10 MG/5ML syrup; Take 5 mLs by mouth at bedtime as needed for cough.  Acute URI    Follow up plan: Return if symptoms worsen or fail to improve. by early next  Assunta Found, MD [facility] 04/08/2015, 3:10 PM

## 2015-04-08 NOTE — Patient Instructions (Addendum)
Tylenol 500mg  every 6 hours  Cough syrup as needed as night  Teaspoon of honey for cough

## 2015-04-09 ENCOUNTER — Ambulatory Visit: Payer: Medicare Other | Admitting: Rehabilitation

## 2015-04-09 ENCOUNTER — Telehealth: Payer: Self-pay | Admitting: Family Medicine

## 2015-04-09 DIAGNOSIS — J0101 Acute recurrent maxillary sinusitis: Secondary | ICD-10-CM

## 2015-04-09 MED ORDER — AMOXICILLIN 500 MG PO CAPS: 500.0000 mg | ORAL_CAPSULE | Freq: Two times a day (BID) | ORAL | Status: DC

## 2015-04-09 NOTE — Telephone Encounter (Signed)
Pt aware abx sent to pharmacy 

## 2015-04-09 NOTE — Telephone Encounter (Signed)
Sent in amoxicillin for sinusitis.

## 2015-04-12 ENCOUNTER — Ambulatory Visit: Payer: Medicare Other | Attending: Rehabilitation | Admitting: Physical Therapy

## 2015-04-12 DIAGNOSIS — G959 Disease of spinal cord, unspecified: Secondary | ICD-10-CM | POA: Diagnosis not present

## 2015-04-12 DIAGNOSIS — M25671 Stiffness of right ankle, not elsewhere classified: Secondary | ICD-10-CM

## 2015-04-12 DIAGNOSIS — R531 Weakness: Secondary | ICD-10-CM | POA: Insufficient documentation

## 2015-04-12 DIAGNOSIS — M5136 Other intervertebral disc degeneration, lumbar region: Secondary | ICD-10-CM | POA: Diagnosis not present

## 2015-04-12 DIAGNOSIS — R29818 Other symptoms and signs involving the nervous system: Secondary | ICD-10-CM | POA: Diagnosis not present

## 2015-04-12 DIAGNOSIS — R262 Difficulty in walking, not elsewhere classified: Secondary | ICD-10-CM | POA: Diagnosis not present

## 2015-04-12 DIAGNOSIS — M25551 Pain in right hip: Secondary | ICD-10-CM | POA: Diagnosis not present

## 2015-04-12 DIAGNOSIS — M25561 Pain in right knee: Secondary | ICD-10-CM | POA: Diagnosis not present

## 2015-04-12 DIAGNOSIS — R2689 Other abnormalities of gait and mobility: Secondary | ICD-10-CM

## 2015-04-12 DIAGNOSIS — R29898 Other symptoms and signs involving the musculoskeletal system: Secondary | ICD-10-CM

## 2015-04-12 DIAGNOSIS — M47817 Spondylosis without myelopathy or radiculopathy, lumbosacral region: Secondary | ICD-10-CM | POA: Diagnosis not present

## 2015-04-12 NOTE — Patient Instructions (Signed)
Knee Flexion: Sitting (Single Leg)    Sit facing anchor, leg extended. Tubing looped around ankle, flex knee, pulling back. Repeat _10_ times per set. Repeat with other leg. Do _1_ sets per session. Do _1-2_ sessions per day.  **HAVE SON HOLD BAND FOR YOU**  http://tub.exer.us/54   Copyright  VHI. All rights reserved.

## 2015-04-12 NOTE — Therapy (Signed)
[facility] [address] [place], [place], [address] Phone: [phone]   Fax:  [phone]  Physical Therapy Treatment  Patient Details  Name: Amy Black MRN: [id number] Date of Birth: 1940/07/29 Referring Provider: Zonia Kief, MD  Encounter Date: 04/12/2015      PT End of Session - 04/12/15 1140    Visit Number 4   Number of Visits 12   Date for PT Re-Evaluation 05/10/15   PT Start Time 1101   PT Stop Time 1140   PT Time Calculation (min) 39 min   Activity Tolerance Patient tolerated treatment well   Behavior During Therapy [facility] for tasks assessed/performed      Past Medical History  Diagnosis Date  . CAD (coronary artery disease)   . Irritable bowel syndrome   . Diverticulosis of colon (without mention of hemorrhage)   . hepatic cyst   . Hyperlipidemia   . Adenosquamous carcinoma     left leg 2004 - radiation & resection  . HTN (hypertension)     PT. DENIES  . History of cardiac catheterization   . Unstable angina ([place])   . Adenocarcinoma (HCC)     LEFT LEG  . Insomnia   . Vitamin D deficiency   . Adenocarcinoma of unknown primary ([place]) 02/06/2011  . History of nuclear stress test 04/2011    lexiscan; normal study, no significant ischemia, low risk   . Coronary artery spasm [facility])     Past Surgical History  Procedure Laterality Date  . Resection soft tissue tumor leg / ankle radical  2004    leg lesion resection & radiation  . Diagnostic laparoscopy    . Lysis of adhesion    . Salpingoophorectomy Left   . Abdominal hysterectomy    . Pelvic laparoscopy  1992    RSO, AND LSO ON 2007  . Cardiac catheterization      coronary spasm  . Transthoracic echocardiogram  07/2012    LV cavity size mildly reduced, normal wall motion; MV with calcified annulus and mild MR; LA mildly dilated; atrial septum with increased thickness - lipomatous hypertrophy; RV systolic pressure increased (borderline pulm  HTN)  . Cardiac catheterization N/A 08/28/2014    Procedure: Left Heart Cath and Coronary Angiography;  Surgeon: Jettie Booze, MD;  Location: [facility];  Service: Cardiovascular;  Laterality: N/A;    There were no vitals filed for this visit.  Visit Diagnosis:  Right leg weakness  Difficulty walking  Loss of balance  Ankle stiffness, right  Pain in joint of right pelvic region or thigh  Weakness  Myelopathy (HCC)  Pain in joint, lower leg, right      Subjective Assessment - 04/12/15 1104    Subjective Doing well; wants to "get some balance in this foot and leg."   Patient Stated Goals Improve my balance and hamstring strength.   Currently in Pain? No/denies                         [facility] Adult PT Treatment/Exercise - 04/12/15 1108    Knee/Hip Exercises: Stretches   Other Knee/Hip Stretches hip addct stretch 2x30 sec bil   Knee/Hip Exercises: Seated   Hamstring Curl Right;10 reps   Hamstring Limitations red theraband   Knee/Hip Exercises: Supine   Bridges 2 sets;10 reps   Other Supine Knee/Hip Exercises hooklying single limb hip abdct with red tband x 10 bil   Knee/Hip Exercises: Sidelying  Hip ABduction Strengthening;2 sets;10 reps;Both   Knee/Hip Exercises: Prone   Hamstring Curl 2 sets;10 reps   Hamstring Curl Limitations 2# on LLE; AA on RLE with focus on eccentric control                PT Education - 04/12/15 1140    Education provided Yes   Education Details Hamstring Curls; red theraband   Person(s) Educated Patient   Methods Explanation;Demonstration;Handout   Comprehension Verbalized understanding;Returned demonstration;Need further instruction          PT Short Term Goals - 03/29/15 1448    PT SHORT TERM GOAL #1   Title I with initial HEP   Time 2   Period Weeks   Status New   PT SHORT TERM GOAL #2   Title patient able to amb with normal heel strike.   Time 2   Period Weeks   Status New            PT Long Term Goals - 03/29/15 1450    PT LONG TERM GOAL #1   Title improved RLE strength to 4+/5 to normalize gait.   Time 6   Period Weeks   Status New   PT LONG TERM GOAL #2   Title improve BERG score to 54/56   Time 6   Period Weeks   Status New   PT LONG TERM GOAL #3   Title Able to ambulate with normal swing phase and heel strike using SPC   Time 6   Period Weeks   Status New   PT LONG TERM GOAL #4   Title Able to climb stairs with a reciprocal gait pattern.   Time 6   Period Weeks   Status New   PT LONG TERM GOAL #5   Title Decreased pain at night by 50%.   Time 6   Period Weeks   Status New               Plan - 04/12/15 1140    Clinical Impression Statement Pt needed mod cues with exercises reviewed today.  Pt reports feeling fatigued after session today.   PT Next Visit Plan address R LE stiffness (proprioception/sensation deficits?), high level balance, glute and hip strength in RLE; stairs   Consulted and Agree with Plan of Care Patient        Problem List Patient Active Problem List   Diagnosis Date Noted  . Abdominal pain 12/14/2014  . Exertional angina (HCC)   . Baker's cyst of knee 02/14/2013  . Coronary artery spasm ([place]) 11/19/2012  . Transverse myelitis ([place]) 10/10/2012  . HTN (hypertension) 08/29/2012  . Arthritis of neck ([place]) 08/29/2012  . Urinary retention 02/20/2012  . Leg weakness 02/20/2012  . Adenocarcinoma of unknown primary ([place]) 02/06/2011  . Anxiety 05/19/2010  . CAD 12/03/2009  . GERD 12/03/2009   Laureen Abrahams, PT, DPT 04/12/2015 11:43 AM  [facility] [address] [place], [place], [address] Phone: [phone]   Fax:  [phone]  Name: Amy Black MRN: [id number] Date of Birth: 1940/08/05

## 2015-04-13 ENCOUNTER — Other Ambulatory Visit: Payer: Self-pay | Admitting: Family Medicine

## 2015-04-14 ENCOUNTER — Ambulatory Visit (INDEPENDENT_AMBULATORY_CARE_PROVIDER_SITE_OTHER): Payer: Medicare Other | Admitting: Cardiology

## 2015-04-14 ENCOUNTER — Encounter: Payer: Self-pay | Admitting: Cardiology

## 2015-04-14 VITALS — BP 130/80 | HR 60 | Ht 63.5 in | Wt 156.0 lb

## 2015-04-14 DIAGNOSIS — I10 Essential (primary) hypertension: Secondary | ICD-10-CM | POA: Diagnosis not present

## 2015-04-14 NOTE — Telephone Encounter (Signed)
Patient last seen in office on 04-08-15 for an acute visit. Rx last filled on 03-10-15. Please advise and route to Pool B so nurse can phone in to pharmacy

## 2015-04-14 NOTE — Telephone Encounter (Signed)
Refill called to [facility]

## 2015-04-14 NOTE — Progress Notes (Signed)
Cardiology Office Note   Date:  04/14/2015   ID:  Amy Black, DOB 01/02/1941, MRN [id number]  PCP:  [name], MD  Cardiologist:   Minus Breeding, MD   No chief complaint on file.   History of Present Illness: Amy Black is a 74 y.o. female she has a history of coronary spasm presumed. Since I last saw her she has done well. She will rarely gets some chest discomfort. She says she's had about 6 nitroglycerin in the last 6 months. She describes this for some fleeting chest discomfort. She does not have substernal chest pressure. Rather this is a left side and is sharp discomfort. She'll either take a tramadol nitroglycerin. She's able to be very active physically exerting herself without bringing on any symptoms. She has no shortness of breath, PND or orthopnea. She has no weight gain or edema.    Past Medical History  Diagnosis Date  . CAD (coronary artery disease)   . Irritable bowel syndrome   . Diverticulosis of colon (without mention of hemorrhage)   . hepatic cyst   . Hyperlipidemia   . Adenosquamous carcinoma     left leg 2004 - radiation & resection  . HTN (hypertension)     PT. DENIES  . History of cardiac catheterization   . Unstable angina ([place])   . Adenocarcinoma (HCC)     LEFT LEG  . Insomnia   . Vitamin D deficiency   . Adenocarcinoma of unknown primary ([place]) 02/06/2011  . History of nuclear stress test 04/2011    lexiscan; normal study, no significant ischemia, low risk   . Coronary artery spasm [facility])     Past Surgical History  Procedure Laterality Date  . Resection soft tissue tumor leg / ankle radical  2004    leg lesion resection & radiation  . Diagnostic laparoscopy    . Lysis of adhesion    . Salpingoophorectomy Left   . Abdominal hysterectomy    . Pelvic laparoscopy  1992    RSO, AND LSO ON 2007  . Cardiac catheterization      coronary spasm  . Transthoracic echocardiogram  07/2012    LV cavity size mildly reduced, normal wall  motion; MV with calcified annulus and mild MR; LA mildly dilated; atrial septum with increased thickness - lipomatous hypertrophy; RV systolic pressure increased (borderline pulm HTN)  . Cardiac catheterization N/A 08/28/2014    Procedure: Left Heart Cath and Coronary Angiography;  Surgeon: Jettie Booze, MD;  Location: [facility];  Service: Cardiovascular;  Laterality: N/A;     Current Outpatient Prescriptions  Medication Sig Dispense Refill  . ALPRAZolam (XANAX) 0.5 MG tablet TAKE ONE TABLET BY MOUTH THREE TIMES DAILY 90 tablet 2  . aspirin 81 MG tablet Take 1 tablet (81 mg total) by mouth daily. 30 tablet   . cetirizine (ZYRTEC) 10 MG tablet Take 1 tablet (10 mg total) by mouth 2 (two) times daily. 30 tablet 0  . Cholecalciferol (VITAMIN D PO) Take 1 capsule by mouth daily. OTC    . fluticasone (FLONASE) 50 MCG/ACT nasal spray Place 2 sprays into both nostrils daily. 16 g 6  . isosorbide mononitrate (IMDUR) 30 MG 24 hr tablet Take 0.5 tablets (15 mg total) by mouth daily. 30 tablet 11  . traMADol (ULTRAM) 50 MG tablet TAKE ONE TABLET BY MOUTH EVERY TWELVE HOURS AS NEEDED. 60 tablet 5  . Vitamin D, Ergocalciferol, (DRISDOL) 50000 units CAPS capsule TAKE ONE CAPSULE BY  MOUTH EVERY 7 DAYS 18 capsule 0  . diphenoxylate-atropine (LOMOTIL) 2.5-0.025 MG tablet Take 1 tablet by mouth 4 (four) times daily as needed for diarrhea or loose stools. (Patient not taking: Reported on 04/14/2015) 30 tablet 1  . NITROSTAT 0.4 MG SL tablet DISSOLVE ONE TABLET UNDER TONGUE EVERY 5 MINUTES UP TO 3 DOSES AS NEEDED FOR CHEST PAIN (Patient not taking: Reported on 04/08/2015) 25 tablet 1  . triamterene-hydrochlorothiazide (DYAZIDE) 37.5-25 MG capsule Take 1 each (1 capsule total) by mouth daily. (Patient not taking: Reported on 04/14/2015)    . verapamil (CALAN) 80 MG tablet Take 1 tablet (80 mg total) by mouth 4 (four) times daily. 115 tablet 0   No current facility-administered medications for this visit.     Allergies:   Cymbalta; Gabapentin; Ivp dye; and Zanaflex     ROS:  Please see the history of present illness.   Otherwise, review of systems are positive none.   All other systems are reviewed and negative.    PHYSICAL EXAM: VS:  BP 130/80 mmHg  Pulse 60  Ht 5' 3.5" (1.613 m)  Wt 156 lb (70.761 kg)  BMI 27.20 kg/m2 , BMI Body mass index is 27.2 kg/(m^2). GENERAL:  Well appearing NECK:  No jugular venous distention, waveform within normal limits, carotid upstroke brisk and symmetric, no bruits, no thyromegaly LUNGS:  Clear to auscultation bilaterally CHEST:  Unremarkable HEART:  PMI not displaced or sustained,S1 and S2 within normal limits, no S3, no S4, no clicks, no rubs, brief apical non radiating systolic murmur ABD:  Flat, positive bowel sounds normal in frequency in pitch, no bruits, no rebound, no guarding, no midline pulsatile mass, no hepatomegaly, no splenomegaly EXT:  2 plus pulses throughout, no edema, no cyanosis no clubbing, right wrist catheterization site unremarkable   EKG:  EKG is ordered today. Sinus rhythm, rate 57, axis within normal limits, intervals within normal limits, no acute ST-T wave changes.   Recent Labs: 01/18/2015: ALT <9; BUN 14.6; Creatinine 0.8; HGB 12.2; Platelets 355; Potassium 4.1; Sodium 140     Wt Readings from Last 3 Encounters:  04/14/15 156 lb (70.761 kg)  04/08/15 154 lb 12.8 oz (70.217 kg)  01/19/15 155 lb 9.6 oz (70.58 kg)      Other studies Reviewed: Additional studies/ records that were reviewed today include: None Review of the above records demonstrates:  Please see elsewhere in the note.     ASSESSMENT AND PLAN:  CHEST PAIN:     She has rare symptoms. She had no fixed coronary disease. She will continue on verapamil for presumed coronary vasospasm.  PALPITATIONS:  These are infrequent. No change in therapy is indicated.  HTN:  The blood pressure is at target. No change in medications is indicated. We will  continue with therapeutic lifestyle changes (TLC).   Current medicines are reviewed at length with the patient today.  The patient does not have concerns regarding medicines.  The following changes have been made:  None  Labs/ tests ordered today include: None  No orders of the defined types were placed in this encounter.     Disposition:   FU with me in 12 months.     Signed, Minus Breeding, MD  04/14/2015 3:42 PM    [facility]

## 2015-04-14 NOTE — Patient Instructions (Signed)

## 2015-04-16 ENCOUNTER — Ambulatory Visit: Payer: Medicare Other | Admitting: Physical Therapy

## 2015-04-16 ENCOUNTER — Encounter: Payer: Self-pay | Admitting: Physical Therapy

## 2015-04-16 DIAGNOSIS — R262 Difficulty in walking, not elsewhere classified: Secondary | ICD-10-CM

## 2015-04-16 DIAGNOSIS — M25671 Stiffness of right ankle, not elsewhere classified: Secondary | ICD-10-CM | POA: Diagnosis not present

## 2015-04-16 DIAGNOSIS — R29898 Other symptoms and signs involving the musculoskeletal system: Secondary | ICD-10-CM

## 2015-04-16 DIAGNOSIS — R2689 Other abnormalities of gait and mobility: Secondary | ICD-10-CM

## 2015-04-16 DIAGNOSIS — M25561 Pain in right knee: Secondary | ICD-10-CM | POA: Diagnosis not present

## 2015-04-16 DIAGNOSIS — G959 Disease of spinal cord, unspecified: Secondary | ICD-10-CM | POA: Diagnosis not present

## 2015-04-16 DIAGNOSIS — M25551 Pain in right hip: Secondary | ICD-10-CM | POA: Diagnosis not present

## 2015-04-16 DIAGNOSIS — R531 Weakness: Secondary | ICD-10-CM | POA: Diagnosis not present

## 2015-04-16 DIAGNOSIS — R29818 Other symptoms and signs involving the nervous system: Secondary | ICD-10-CM | POA: Diagnosis not present

## 2015-04-16 NOTE — Therapy (Signed)
[facility] [address] [place], [place], [address] Phone: [phone]   Fax:  [phone]  Physical Therapy Treatment  Patient Details  Name: Amy Black MRN: [id number] Date of Birth: September 25, 1940 Referring Provider: Zonia Kief, MD  Encounter Date: 04/16/2015      PT End of Session - 04/16/15 1551    Visit Number 5   Number of Visits 12   Date for PT Re-Evaluation 05/10/15   PT Start Time [id number]   PT Stop Time 1445   PT Time Calculation (min) 42 min   Equipment Utilized During Treatment Gait belt   Activity Tolerance Patient tolerated treatment well   Behavior During Therapy [facility] for tasks assessed/performed      Past Medical History  Diagnosis Date  . CAD (coronary artery disease)   . Irritable bowel syndrome   . Diverticulosis of colon (without mention of hemorrhage)   . hepatic cyst   . Hyperlipidemia   . Adenosquamous carcinoma     left leg 2004 - radiation & resection  . HTN (hypertension)     PT. DENIES  . History of cardiac catheterization   . Unstable angina ([place])   . Adenocarcinoma (HCC)     LEFT LEG  . Insomnia   . Vitamin D deficiency   . Adenocarcinoma of unknown primary ([place]) 02/06/2011  . History of nuclear stress test 04/2011    lexiscan; normal study, no significant ischemia, low risk   . Coronary artery spasm [facility])     Past Surgical History  Procedure Laterality Date  . Resection soft tissue tumor leg / ankle radical  2004    leg lesion resection & radiation  . Diagnostic laparoscopy    . Lysis of adhesion    . Salpingoophorectomy Left   . Abdominal hysterectomy    . Pelvic laparoscopy  1992    RSO, AND LSO ON 2007  . Cardiac catheterization      coronary spasm  . Transthoracic echocardiogram  07/2012    LV cavity size mildly reduced, normal wall motion; MV with calcified annulus and mild MR; LA mildly dilated; atrial septum with increased thickness - lipomatous  hypertrophy; RV systolic pressure increased (borderline pulm HTN)  . Cardiac catheterization N/A 08/28/2014    Procedure: Left Heart Cath and Coronary Angiography;  Surgeon: Jettie Booze, MD;  Location: [facility];  Service: Cardiovascular;  Laterality: N/A;    There were no vitals filed for this visit.  Visit Diagnosis:  Right leg weakness  Loss of balance  Difficulty walking      Subjective Assessment - 04/16/15 1409    Subjective Doing well; wants to "get some balance in this foot and leg." No new falls.    Patient Stated Goals Improve my balance and hamstring strength.   Currently in Pain? No/denies          [facility] Adult PT Treatment/Exercise - 04/16/15 1546    Ambulation/Gait   Ambulation/Gait Yes   Ambulation/Gait Assistance 5: Supervision   Ambulation/Gait Assistance Details Verbal cues for increased heel strike, bending R knee, and avoiding circumduction.   Ambulation Distance (Feet) 230 Feet   Assistive device Straight cane   Gait Pattern Right circumduction;Right hip hike;Right foot flat   Ambulation Surface Level;Indoor   Neuro Re-ed    Neuro Re-ed Details  // Bars Airex pad: standing no head movement, head shake, head nod, diagonals; repeated eyes closed. Rocker board vertical orientation head shake, head nod, diagonals; repeated  eyes closed, all for 1 set of 10 reps each with no UE support min/guard to minA to decrease fall risk.    Exercises   Exercises Knee/Hip;Ankle   Knee/Hip Exercises: Stretches   Active Hamstring Stretch Right;2 reps;30 seconds   Active Hamstring Stretch Limitations With belt for assist.   Quad Stretch Right;2 reps;30 seconds   Hip Flexor Stretch Right;2 reps;30 seconds   Knee/Hip Exercises: Standing   Heel Raises Both;2 sets;10 reps   Hip Flexion Stengthening;Both;1 set;10 reps   Hip Abduction Both;1 set;10 reps;Stengthening   Hip Extension Stengthening;Both;1 set;10 reps          PT Short Term Goals - 03/29/15 1448     PT SHORT TERM GOAL #1   Title I with initial HEP   Time 2   Period Weeks   Status New   PT SHORT TERM GOAL #2   Title patient able to amb with normal heel strike.   Time 2   Period Weeks   Status New           PT Long Term Goals - 03/29/15 1450    PT LONG TERM GOAL #1   Title improved RLE strength to 4+/5 to normalize gait.   Time 6   Period Weeks   Status New   PT LONG TERM GOAL #2   Title improve BERG score to 54/56   Time 6   Period Weeks   Status New   PT LONG TERM GOAL #3   Title Able to ambulate with normal swing phase and heel strike using SPC   Time 6   Period Weeks   Status New   PT LONG TERM GOAL #4   Title Able to climb stairs with a reciprocal gait pattern.   Time 6   Period Weeks   Status New   PT LONG TERM GOAL #5   Title Decreased pain at night by 50%.   Time 6   Period Weeks   Status New          Plan - 04/16/15 1552    Clinical Impression Statement Skilled session addressing deficits in RLE flexibility/ROM, hip/knee strength, gait, and static balance. Patient struggled to maintain upright posture during standing hip exercises, especially in hip extension. Patient excelled with challenging static balance and adapted quickly to various surfaces. Patient is making steady progress toward goals.    Pt will benefit from skilled therapeutic intervention in order to improve on the following deficits Abnormal gait;Decreased range of motion;Pain;Decreased balance;Decreased strength   Rehab Potential Good   PT Frequency 2x / week   PT Duration 6 weeks   PT Treatment/Interventions Electrical Stimulation;Moist Heat;Gait training;Stair training;Therapeutic exercise;Manual techniques;Patient/family education;Passive range of motion;Neuromuscular re-education;Balance training;Orthotic Fit/Training;DME Instruction;Functional mobility training;Therapeutic activities   PT Next Visit Plan Check STGs. address R LE stiffness (proprioception/sensation deficits?),  high level dynamic balance, glute and hip strength in RLE; patient struggles to maintain upright posture in LE strengthening exercises.   PT Home Exercise Plan see pt instruction from 04/05/15   Consulted and Agree with Plan of Care Patient      Problem List Patient Active Problem List   Diagnosis Date Noted  . Abdominal pain 12/14/2014  . Exertional angina (HCC)   . Baker's cyst of knee 02/14/2013  . Coronary artery spasm ([place]) 11/19/2012  . Transverse myelitis ([place]) 10/10/2012  . HTN (hypertension) 08/29/2012  . Arthritis of neck ([place]) 08/29/2012  . Urinary retention 02/20/2012  . Leg weakness 02/20/2012  . Adenocarcinoma  of unknown primary ([place]) 02/06/2011  . Anxiety 05/19/2010  . CAD 12/03/2009  . GERD 12/03/2009    Bayard Beaver, [place] 04/16/2015, 3:56 PM  [facility] [address] [place], [place], [address] Phone: [phone]   Fax:  [phone]  Name: Amy Black MRN: [id number] Date of Birth: 1941/01/26  This note has been reviewed and edited by supervising CI.  Willow Ora, PTA, [facility] [address], [place], [place] [address] [phone] 04/18/2015, 11:15 PM

## 2015-04-19 ENCOUNTER — Ambulatory Visit: Payer: Medicare Other | Admitting: Physical Therapy

## 2015-04-19 ENCOUNTER — Encounter: Payer: Self-pay | Admitting: Physical Therapy

## 2015-04-19 DIAGNOSIS — R29818 Other symptoms and signs involving the nervous system: Secondary | ICD-10-CM | POA: Diagnosis not present

## 2015-04-19 DIAGNOSIS — R531 Weakness: Secondary | ICD-10-CM

## 2015-04-19 DIAGNOSIS — M25561 Pain in right knee: Secondary | ICD-10-CM | POA: Diagnosis not present

## 2015-04-19 DIAGNOSIS — G959 Disease of spinal cord, unspecified: Secondary | ICD-10-CM | POA: Diagnosis not present

## 2015-04-19 DIAGNOSIS — R2689 Other abnormalities of gait and mobility: Secondary | ICD-10-CM

## 2015-04-19 DIAGNOSIS — M25551 Pain in right hip: Secondary | ICD-10-CM | POA: Diagnosis not present

## 2015-04-19 DIAGNOSIS — M25671 Stiffness of right ankle, not elsewhere classified: Secondary | ICD-10-CM | POA: Diagnosis not present

## 2015-04-19 DIAGNOSIS — R262 Difficulty in walking, not elsewhere classified: Secondary | ICD-10-CM | POA: Diagnosis not present

## 2015-04-19 DIAGNOSIS — R29898 Other symptoms and signs involving the musculoskeletal system: Secondary | ICD-10-CM

## 2015-04-19 NOTE — Therapy (Signed)
[facility] [address] [place], [place], [address] Phone: [phone]   Fax:  [phone]  Physical Therapy Treatment  Patient Details  Name: Amy Black MRN: [id number] Date of Birth: 1941-01-06 Referring Provider: Zonia Kief, MD  Encounter Date: 04/19/2015      PT End of Session - 04/19/15 1321    Visit Number 6   Number of Visits 12   Date for PT Re-Evaluation 05/10/15   PT Start Time 1146   PT Stop Time 1230   PT Time Calculation (min) 44 min   Equipment Utilized During Treatment Gait belt   Activity Tolerance Patient tolerated treatment well   Behavior During Therapy [facility] for tasks assessed/performed      Past Medical History  Diagnosis Date  . CAD (coronary artery disease)   . Irritable bowel syndrome   . Diverticulosis of colon (without mention of hemorrhage)   . hepatic cyst   . Hyperlipidemia   . Adenosquamous carcinoma     left leg 2004 - radiation & resection  . HTN (hypertension)     PT. DENIES  . History of cardiac catheterization   . Unstable angina ([place])   . Adenocarcinoma (HCC)     LEFT LEG  . Insomnia   . Vitamin D deficiency   . Adenocarcinoma of unknown primary ([place]) 02/06/2011  . History of nuclear stress test 04/2011    lexiscan; normal study, no significant ischemia, low risk   . Coronary artery spasm [facility])     Past Surgical History  Procedure Laterality Date  . Resection soft tissue tumor leg / ankle radical  2004    leg lesion resection & radiation  . Diagnostic laparoscopy    . Lysis of adhesion    . Salpingoophorectomy Left   . Abdominal hysterectomy    . Pelvic laparoscopy  1992    RSO, AND LSO ON 2007  . Cardiac catheterization      coronary spasm  . Transthoracic echocardiogram  07/2012    LV cavity size mildly reduced, normal wall motion; MV with calcified annulus and mild MR; LA mildly dilated; atrial septum with increased thickness - lipomatous  hypertrophy; RV systolic pressure increased (borderline pulm HTN)  . Cardiac catheterization N/A 08/28/2014    Procedure: Left Heart Cath and Coronary Angiography;  Surgeon: Jettie Booze, MD;  Location: [facility];  Service: Cardiovascular;  Laterality: N/A;    There were no vitals filed for this visit.  Visit Diagnosis:  Right leg weakness  Loss of balance  Weakness      Subjective Assessment - 04/19/15 1150    Subjective No new falls or pain. Patient reports she is doing well today.   Patient Stated Goals Improve my balance and hamstring strength.   Currently in Pain? No/denies          [facility] Adult PT Treatment/Exercise - 04/19/15 1152    Neuro Re-ed    Neuro Re-ed Details  Blocked practice on obstacle course with sharp turns around cones, target stepping, and toe taps on cones all on foam mat with stepping stones between min/guard to minA with no UE support with three episodes of LOB; two during cone taps to side with patient recovery and once during stepping stones to side with clinician recovery to decrease fall risk.   Exercises   Exercises Knee/Hip;Ankle   Knee/Hip Exercises: Stretches   Active Hamstring Stretch Right;2 reps;30 seconds   Active Hamstring Stretch Limitations With belt for  assist.   Quad Stretch Right;2 reps;30 seconds   Hip Flexor Stretch Right;2 reps;30 seconds   Knee/Hip Exercises: Standing   Heel Raises Both;2 sets;10 reps   Hip Flexion Stengthening;Both;1 set;10 reps   Hip Abduction Both;1 set;10 reps;Stengthening   Hip Extension Stengthening;Both;1 set;10 reps          PT Short Term Goals - 03/29/15 1448    PT SHORT TERM GOAL #1   Title I with initial HEP   Time 2   Period Weeks   Status New   PT SHORT TERM GOAL #2   Title patient able to amb with normal heel strike.   Time 2   Period Weeks   Status New          PT Long Term Goals - 03/29/15 1450    PT LONG TERM GOAL #1   Title improved RLE strength to 4+/5 to  normalize gait.   Time 6   Period Weeks   Status New   PT LONG TERM GOAL #2   Title improve BERG score to 54/56   Time 6   Period Weeks   Status New   PT LONG TERM GOAL #3   Title Able to ambulate with normal swing phase and heel strike using SPC   Time 6   Period Weeks   Status New   PT LONG TERM GOAL #4   Title Able to climb stairs with a reciprocal gait pattern.   Time 6   Period Weeks   Status New   PT LONG TERM GOAL #5   Title Decreased pain at night by 50%.   Time 6   Period Weeks   Status New          Plan - 04/19/15 1321    Clinical Impression Statement Skilled sessions addressing stiffness RLE, weakness in hip/knee, and dynamic balance deficits. Patient demonstrated enthusiasm for challenging balance and has verbalized interest in her dynamic balance training. Patient is making consistent progress toward goals.   Pt will benefit from skilled therapeutic intervention in order to improve on the following deficits Abnormal gait;Decreased range of motion;Pain;Decreased balance;Decreased strength   Rehab Potential Good   PT Frequency 2x / week   PT Duration 6 weeks   PT Treatment/Interventions Electrical Stimulation;Moist Heat;Gait training;Stair training;Therapeutic exercise;Manual techniques;Patient/family education;Passive range of motion;Neuromuscular re-education;Balance training;Orthotic Fit/Training;DME Instruction;Functional mobility training;Therapeutic activities   PT Next Visit Plan address R LE stiffness (proprioception/sensation deficits?), high level dynamic balance, glute and hip strength in RLE; patient struggles to maintain upright posture in LE strengthening exercises.   PT Home Exercise Plan see pt instruction from 04/05/15   Consulted and Agree with Plan of Care Patient       Problem List Patient Active Problem List   Diagnosis Date Noted  . Abdominal pain 12/14/2014  . Exertional angina (HCC)   . Baker's cyst of knee 02/14/2013  . Coronary  artery spasm ([place]) 11/19/2012  . Transverse myelitis ([place]) 10/10/2012  . HTN (hypertension) 08/29/2012  . Arthritis of neck ([place]) 08/29/2012  . Urinary retention 02/20/2012  . Leg weakness 02/20/2012  . Adenocarcinoma of unknown primary ([place]) 02/06/2011  . Anxiety 05/19/2010  . CAD 12/03/2009  . GERD 12/03/2009    Bayard Beaver, [place] 04/19/2015, 1:26 PM  [facility] [address] [place], [place], [address] Phone: [phone]   Fax:  [phone]  Name: Archisha Eskola Sonntag MRN: [id number] Date of Birth: [birth year]  This note has been reviewed and  edited by supervising CI.  Willow Ora, PTA, [facility] [address], [place], [place] [address] [phone] 04/19/2015, 2:42 PM

## 2015-04-23 ENCOUNTER — Encounter: Payer: Self-pay | Admitting: Physical Therapy

## 2015-04-23 ENCOUNTER — Ambulatory Visit: Payer: Medicare Other | Admitting: Physical Therapy

## 2015-04-23 DIAGNOSIS — M25671 Stiffness of right ankle, not elsewhere classified: Secondary | ICD-10-CM | POA: Diagnosis not present

## 2015-04-23 DIAGNOSIS — R29898 Other symptoms and signs involving the musculoskeletal system: Secondary | ICD-10-CM | POA: Diagnosis not present

## 2015-04-23 DIAGNOSIS — R531 Weakness: Secondary | ICD-10-CM

## 2015-04-23 DIAGNOSIS — R2689 Other abnormalities of gait and mobility: Secondary | ICD-10-CM

## 2015-04-23 DIAGNOSIS — M25561 Pain in right knee: Secondary | ICD-10-CM | POA: Diagnosis not present

## 2015-04-23 DIAGNOSIS — M25551 Pain in right hip: Secondary | ICD-10-CM | POA: Diagnosis not present

## 2015-04-23 DIAGNOSIS — R262 Difficulty in walking, not elsewhere classified: Secondary | ICD-10-CM

## 2015-04-23 DIAGNOSIS — G959 Disease of spinal cord, unspecified: Secondary | ICD-10-CM | POA: Diagnosis not present

## 2015-04-23 DIAGNOSIS — R29818 Other symptoms and signs involving the nervous system: Secondary | ICD-10-CM | POA: Diagnosis not present

## 2015-04-23 NOTE — Therapy (Signed)
[facility] [address] [place], [place], [address] Phone: [phone]   Fax:  [phone]  Physical Therapy Treatment  Patient Details  Name: Amy Black MRN: [id number] Date of Birth: [birth year] Referring Provider: Zonia Kief, MD  Encounter Date: 04/23/2015      PT End of Session - 04/23/15 1508    Visit Number 7   Number of Visits 12   Date for PT Re-Evaluation 05/10/15   PT Start Time 1400   PT Stop Time 1446   PT Time Calculation (min) 46 min   Equipment Utilized During Treatment Gait belt   Activity Tolerance Patient tolerated treatment well   Behavior During Therapy [facility] for tasks assessed/performed      Past Medical History  Diagnosis Date  . CAD (coronary artery disease)   . Irritable bowel syndrome   . Diverticulosis of colon (without mention of hemorrhage)   . hepatic cyst   . Hyperlipidemia   . Adenosquamous carcinoma     left leg 2004 - radiation & resection  . HTN (hypertension)     PT. DENIES  . History of cardiac catheterization   . Unstable angina ([place])   . Adenocarcinoma (HCC)     LEFT LEG  . Insomnia   . Vitamin D deficiency   . Adenocarcinoma of unknown primary ([place]) 02/06/2011  . History of nuclear stress test 04/2011    lexiscan; normal study, no significant ischemia, low risk   . Coronary artery spasm [facility])     Past Surgical History  Procedure Laterality Date  . Resection soft tissue tumor leg / ankle radical  2004    leg lesion resection & radiation  . Diagnostic laparoscopy    . Lysis of adhesion    . Salpingoophorectomy Left   . Abdominal hysterectomy    . Pelvic laparoscopy  1992    RSO, AND LSO ON 2007  . Cardiac catheterization      coronary spasm  . Transthoracic echocardiogram  07/2012    LV cavity size mildly reduced, normal wall motion; MV with calcified annulus and mild MR; LA mildly dilated; atrial septum with increased thickness - lipomatous  hypertrophy; RV systolic pressure increased (borderline pulm HTN)  . Cardiac catheterization N/A 08/28/2014    Procedure: Left Heart Cath and Coronary Angiography;  Surgeon: Jettie Booze, MD;  Location: [facility];  Service: Cardiovascular;  Laterality: N/A;    There were no vitals filed for this visit.  Visit Diagnosis:  Right leg weakness  Loss of balance  Weakness  Difficulty walking      Subjective Assessment - 04/23/15 1406    Subjective No new falls or pain. Patient reports she is doing well today.   Patient Stated Goals Improve my balance and hamstring strength.   Currently in Pain? No/denies          [facility] Adult PT Treatment/Exercise - 04/23/15 1459    Transfers   Transfers Sit to Stand;Stand to Sit   Sit to Stand 5: Supervision   Sit to Stand Details Tactile cues for posture   Sit to Stand Details (indicate cue type and reason) x 10 reps from low surface.   Stand to Sit 5: Supervision   Stand to Sit Details (indicate cue type and reason) Tactile cues for posture   Stand to Sit Details x10 reps to low surface.   Ambulation/Gait   Ambulation/Gait Yes   Ambulation/Gait Assistance 5: Supervision   Ambulation/Gait Assistance Details Verbal  cues for increased heel strike, bending R knee, and avoiding circumduction   Ambulation Distance (Feet) 500 Feet   Assistive device Straight cane   Gait Pattern Right circumduction;Right hip hike;Right foot flat   Ambulation Surface Level;Indoor   Neuro Re-ed    Neuro Re-ed Details  Hallway walking 8 x 50' with head turns vertical, horizontal, diagonals. // Glennie Hawk on foam surface tandem walk, side stepping, grapevine down/back x 2 each; boxing activity and hitting moving target BUE on pole on Airex pad with feet together all min/guard to minA for 2 min each to decrease fall risk.   Exercises   Exercises Knee/Hip;Ankle   Lumbar Exercises: Stretches   Active Hamstring Stretch 2 reps;30 seconds  With belt   Knee/Hip  Exercises: Stretches   Pension scheme manager reps;30 seconds   Hip Flexor Stretch Right;2 reps;30 seconds   Knee/Hip Exercises: Standing   Heel Raises Both;2 sets;10 reps   Hip Flexion Stengthening;Both;1 set;10 reps   Hip Abduction Both;1 set;10 reps;Stengthening   Hip Extension Stengthening;Both;1 set;10 reps   Functional Squat 1 set;10 reps          PT Short Term Goals - 03/29/15 1448    PT SHORT TERM GOAL #1   Title I with initial HEP   Time 2   Period Weeks   Status New   PT SHORT TERM GOAL #2   Title patient able to amb with normal heel strike.   Time 2   Period Weeks   Status New          PT Long Term Goals - 03/29/15 1450    PT LONG TERM GOAL #1   Title improved RLE strength to 4+/5 to normalize gait.   Time 6   Period Weeks   Status New   PT LONG TERM GOAL #2   Title improve BERG score to 54/56   Time 6   Period Weeks   Status New   PT LONG TERM GOAL #3   Title Able to ambulate with normal swing phase and heel strike using SPC   Time 6   Period Weeks   Status New   PT LONG TERM GOAL #4   Title Able to climb stairs with a reciprocal gait pattern.   Time 6   Period Weeks   Status New   PT LONG TERM GOAL #5   Title Decreased pain at night by 50%.   Time 6   Period Weeks   Status New          Plan - 04/23/15 1509    Clinical Impression Statement Skilled session addressing RLE stiffness, hip/knee strengthening, and static/dynamic balance. Patient verbalized increased confidence in ambulating with head turns and transferring sit<>stand from lower surfaces secondary to self-reported increased strength. Patient is making steady progress toward goals.   Pt will benefit from skilled therapeutic intervention in order to improve on the following deficits Abnormal gait;Decreased range of motion;Pain;Decreased balance;Decreased strength   Rehab Potential Good   PT Frequency 2x / week   PT Duration 6 weeks   PT Treatment/Interventions Electrical  Stimulation;Moist Heat;Gait training;Stair training;Therapeutic exercise;Manual techniques;Patient/family education;Passive range of motion;Neuromuscular re-education;Balance training;Orthotic Fit/Training;DME Instruction;Functional mobility training;Therapeutic activities   PT Next Visit Plan STGs due next session. address R LE stiffness, high level dynamic balance, glute and hip strength in RLE. Consider SLS and other challenges to isolate and address RLE weakness.    PT Home Exercise Plan see pt instruction from 04/05/15   Consulted and Agree with  Plan of Care Patient      Problem List Patient Active Problem List   Diagnosis Date Noted  . Abdominal pain 12/14/2014  . Exertional angina (HCC)   . Baker's cyst of knee 02/14/2013  . Coronary artery spasm ([place]) 11/19/2012  . Transverse myelitis ([place]) 10/10/2012  . HTN (hypertension) 08/29/2012  . Arthritis of neck ([place]) 08/29/2012  . Urinary retention 02/20/2012  . Leg weakness 02/20/2012  . Adenocarcinoma of unknown primary ([place]) 02/06/2011  . Anxiety 05/19/2010  . CAD 12/03/2009  . GERD 12/03/2009    Bayard Beaver, [place] 04/23/2015, 3:13 PM  [facility] [address] [place], [place], [address] Phone: [phone]   Fax:  [phone]  Name: Amy Black MRN: [id number] Date of Birth: October 06, 1940  This note has been reviewed and edited by supervising CI.  Willow Ora, PTA, [facility] [address] [place], [place] [address] [phone] 04/25/2015, 10:55 PM

## 2015-04-26 ENCOUNTER — Ambulatory Visit: Payer: Medicare Other | Admitting: Physical Therapy

## 2015-04-26 DIAGNOSIS — R29898 Other symptoms and signs involving the musculoskeletal system: Secondary | ICD-10-CM

## 2015-04-26 DIAGNOSIS — M25671 Stiffness of right ankle, not elsewhere classified: Secondary | ICD-10-CM | POA: Diagnosis not present

## 2015-04-26 DIAGNOSIS — G959 Disease of spinal cord, unspecified: Secondary | ICD-10-CM | POA: Diagnosis not present

## 2015-04-26 DIAGNOSIS — M25561 Pain in right knee: Secondary | ICD-10-CM | POA: Diagnosis not present

## 2015-04-26 DIAGNOSIS — R262 Difficulty in walking, not elsewhere classified: Secondary | ICD-10-CM

## 2015-04-26 DIAGNOSIS — M25551 Pain in right hip: Secondary | ICD-10-CM | POA: Diagnosis not present

## 2015-04-26 DIAGNOSIS — R531 Weakness: Secondary | ICD-10-CM

## 2015-04-26 DIAGNOSIS — R2689 Other abnormalities of gait and mobility: Secondary | ICD-10-CM

## 2015-04-26 DIAGNOSIS — R29818 Other symptoms and signs involving the nervous system: Secondary | ICD-10-CM | POA: Diagnosis not present

## 2015-04-26 NOTE — Therapy (Signed)
[facility] [address] [place], [place], [address] Phone: [phone]   Fax:  [phone]  Physical Therapy Treatment  Patient Details  Name: Amy Black MRN: [id number] Date of Birth: [birth year] Referring Provider: Zonia Kief, MD  Encounter Date: 04/26/2015      PT End of Session - 04/26/15 1446    Visit Number 8   Number of Visits 12   Date for PT Re-Evaluation 05/10/15   PT Start Time 1230   PT Stop Time 1313   PT Time Calculation (min) 43 min   Activity Tolerance Patient tolerated treatment well   Behavior During Therapy [facility] for tasks assessed/performed      Past Medical History  Diagnosis Date  . CAD (coronary artery disease)   . Irritable bowel syndrome   . Diverticulosis of colon (without mention of hemorrhage)   . hepatic cyst   . Hyperlipidemia   . Adenosquamous carcinoma     left leg 2004 - radiation & resection  . HTN (hypertension)     PT. DENIES  . History of cardiac catheterization   . Unstable angina ([place])   . Adenocarcinoma (HCC)     LEFT LEG  . Insomnia   . Vitamin D deficiency   . Adenocarcinoma of unknown primary ([place]) 02/06/2011  . History of nuclear stress test 04/2011    lexiscan; normal study, no significant ischemia, low risk   . Coronary artery spasm [facility])     Past Surgical History  Procedure Laterality Date  . Resection soft tissue tumor leg / ankle radical  2004    leg lesion resection & radiation  . Diagnostic laparoscopy    . Lysis of adhesion    . Salpingoophorectomy Left   . Abdominal hysterectomy    . Pelvic laparoscopy  1992    RSO, AND LSO ON 2007  . Cardiac catheterization      coronary spasm  . Transthoracic echocardiogram  07/2012    LV cavity size mildly reduced, normal wall motion; MV with calcified annulus and mild MR; LA mildly dilated; atrial septum with increased thickness - lipomatous hypertrophy; RV systolic pressure increased (borderline pulm  HTN)  . Cardiac catheterization N/A 08/28/2014    Procedure: Left Heart Cath and Coronary Angiography;  Surgeon: Jettie Booze, MD;  Location: [facility];  Service: Cardiovascular;  Laterality: N/A;    There were no vitals filed for this visit.  Visit Diagnosis:  Right leg weakness  Loss of balance  Weakness  Difficulty walking      Subjective Assessment - 04/26/15 1445    Subjective No new complaints.  Wants to go to neuro specific rehab until she is better - will find other clinics if we d/c   Patient Stated Goals Improve my balance and hamstring strength.   Currently in Pain? No/denies                Reviewed HEP: max cues to decrease speed with exercises however pt not following instructions stating "that's not going to happen"  Static standing in parallel bars with passive knee flexion and pt with attempt for eccentric control to lower; unable to activate hamstrings x 10 reps.  Amb with single point cane with cues for R knee flexion to decrease compensations.  Pt with difficulty following instructions even with use of mirror for visual cues.  Pt unable to slow down and follow PT recommendations.  Static standing with LLE on 4" block and min A:  Horizontal/vertical head turns; trunk rotation x 10; UE flexion x 10  NuStep L 4 x 5 min lower extremities                PT Education - 04/26/15 1446    Education provided Yes   Education Details expected progress/recovery with diagnosis; goals of care and plan of care (pt does not fully appear to understand goals of therapy)   Person(s) Educated Patient   Methods Explanation   Comprehension Verbalized understanding;Need further instruction          PT Short Term Goals - 04/26/15 1453    PT SHORT TERM GOAL #1   Title I with initial HEP   Baseline needs mod to max cues   Status Not Met   PT SHORT TERM GOAL #2   Title patient able to amb with normal heel strike.   Status Achieved            PT Long Term Goals - 03/29/15 1450    PT LONG TERM GOAL #1   Title improved RLE strength to 4+/5 to normalize gait.   Time 6   Period Weeks   Status New   PT LONG TERM GOAL #2   Title improve BERG score to 54/56   Time 6   Period Weeks   Status New   PT LONG TERM GOAL #3   Title Able to ambulate with normal swing phase and heel strike using SPC   Time 6   Period Weeks   Status New   PT LONG TERM GOAL #4   Title Able to climb stairs with a reciprocal gait pattern.   Time 6   Period Weeks   Status New   PT LONG TERM GOAL #5   Title Decreased pain at night by 50%.   Time 6   Period Weeks   Status New               Plan - 04/26/15 1447    Clinical Impression Statement Long discussion with pt today about current progress and goals of PT.  Began discussion with pt about d/c at end of plan of care as residual deficits remain and pt unlikely to make full recovery from transverse myelitis many years ago.  Discussed our goals of therapy to help develop plan/exercise program pt can compelete and continue at d/c.  Initially pt appeared to agree with this plan but then would continue to revert to needing "neuro rehab" and needed continued therapy "I'll go to [facility] if I have to."  Continued attempts to explain need to demonstrate progress with PT and that goals of therapy are to impove independence and educate on community resources to continue exercise on her own.  Pt stated "I tried that and it doesn't work."  Continued to be unwilling to accept d/c from skilled PT services at end of plan of care.  Multiple attempts were made duruing session to slow down or adjust gait deviations with pt stating multiple times "I can't do that."  Reinforced need to follow PT instructions in order to make address pt's goals.  At this time feel redisual deficits unlikely to improve and will not see significant change in functional mobility to justify continuation of services past plan of care.    PT Next Visit Plan address R LE stiffness, high level dynamic balance, glute and hip strength in RLE. Consider SLS and other challenges to isolate and address RLE weakness.    Consulted and Agree with  Plan of Care Patient        Problem List Patient Active Problem List   Diagnosis Date Noted  . Abdominal pain 12/14/2014  . Exertional angina (HCC)   . Baker's cyst of knee 02/14/2013  . Coronary artery spasm ([place]) 11/19/2012  . Transverse myelitis ([place]) 10/10/2012  . HTN (hypertension) 08/29/2012  . Arthritis of neck ([place]) 08/29/2012  . Urinary retention 02/20/2012  . Leg weakness 02/20/2012  . Adenocarcinoma of unknown primary ([place]) 02/06/2011  . Anxiety 05/19/2010  . CAD 12/03/2009  . GERD 12/03/2009   Laureen Abrahams, PT, DPT 04/26/2015 2:56 PM  [facility] [address] [place], [place], [address] Phone: [phone]   Fax:  [phone]  Name: Amy Black MRN: [id number] Date of Birth: 1940-05-10

## 2015-04-27 ENCOUNTER — Ambulatory Visit: Payer: Medicare Other | Admitting: Family Medicine

## 2015-04-28 DIAGNOSIS — Z9889 Other specified postprocedural states: Secondary | ICD-10-CM | POA: Diagnosis not present

## 2015-04-28 DIAGNOSIS — H35372 Puckering of macula, left eye: Secondary | ICD-10-CM | POA: Diagnosis not present

## 2015-04-28 DIAGNOSIS — H35371 Puckering of macula, right eye: Secondary | ICD-10-CM | POA: Diagnosis not present

## 2015-04-28 DIAGNOSIS — Z961 Presence of intraocular lens: Secondary | ICD-10-CM | POA: Diagnosis not present

## 2015-04-29 DIAGNOSIS — M791 Myalgia: Secondary | ICD-10-CM | POA: Diagnosis not present

## 2015-04-29 DIAGNOSIS — M47817 Spondylosis without myelopathy or radiculopathy, lumbosacral region: Secondary | ICD-10-CM | POA: Diagnosis not present

## 2015-04-29 DIAGNOSIS — M5136 Other intervertebral disc degeneration, lumbar region: Secondary | ICD-10-CM | POA: Diagnosis not present

## 2015-04-29 DIAGNOSIS — I10 Essential (primary) hypertension: Secondary | ICD-10-CM | POA: Diagnosis not present

## 2015-04-30 ENCOUNTER — Ambulatory Visit: Payer: Medicare Other | Admitting: Physical Therapy

## 2015-05-03 ENCOUNTER — Ambulatory Visit: Payer: Medicare Other | Admitting: Physical Therapy

## 2015-05-03 DIAGNOSIS — M25551 Pain in right hip: Secondary | ICD-10-CM | POA: Diagnosis not present

## 2015-05-03 DIAGNOSIS — R531 Weakness: Secondary | ICD-10-CM

## 2015-05-03 DIAGNOSIS — G959 Disease of spinal cord, unspecified: Secondary | ICD-10-CM | POA: Diagnosis not present

## 2015-05-03 DIAGNOSIS — R29898 Other symptoms and signs involving the musculoskeletal system: Secondary | ICD-10-CM | POA: Diagnosis not present

## 2015-05-03 DIAGNOSIS — R2689 Other abnormalities of gait and mobility: Secondary | ICD-10-CM

## 2015-05-03 DIAGNOSIS — R262 Difficulty in walking, not elsewhere classified: Secondary | ICD-10-CM

## 2015-05-03 DIAGNOSIS — M25671 Stiffness of right ankle, not elsewhere classified: Secondary | ICD-10-CM | POA: Diagnosis not present

## 2015-05-03 DIAGNOSIS — M25561 Pain in right knee: Secondary | ICD-10-CM | POA: Diagnosis not present

## 2015-05-03 DIAGNOSIS — R29818 Other symptoms and signs involving the nervous system: Secondary | ICD-10-CM | POA: Diagnosis not present

## 2015-05-03 NOTE — Therapy (Signed)
[facility] [address] [place], [place], [address] Phone: [phone]   Fax:  [phone]  Physical Therapy Treatment  Patient Details  Name: Amy Black MRN: [id number] Date of Birth: 09/08/1940 Referring Provider: Zonia Kief, MD  Encounter Date: 05/03/2015      PT End of Session - 05/03/15 1305    Visit Number 9   Number of Visits 12   Date for PT Re-Evaluation 05/10/15   PT Start Time 1230   PT Stop Time 1314   PT Time Calculation (min) 44 min   Equipment Utilized During Treatment Gait belt   Activity Tolerance Patient tolerated treatment well   Behavior During Therapy [facility] for tasks assessed/performed      Past Medical History  Diagnosis Date  . CAD (coronary artery disease)   . Irritable bowel syndrome   . Diverticulosis of colon (without mention of hemorrhage)   . hepatic cyst   . Hyperlipidemia   . Adenosquamous carcinoma     left leg 2004 - radiation & resection  . HTN (hypertension)     PT. DENIES  . History of cardiac catheterization   . Unstable angina ([place])   . Adenocarcinoma (HCC)     LEFT LEG  . Insomnia   . Vitamin D deficiency   . Adenocarcinoma of unknown primary ([place]) 02/06/2011  . History of nuclear stress test 04/2011    lexiscan; normal study, no significant ischemia, low risk   . Coronary artery spasm [facility])     Past Surgical History  Procedure Laterality Date  . Resection soft tissue tumor leg / ankle radical  2004    leg lesion resection & radiation  . Diagnostic laparoscopy    . Lysis of adhesion    . Salpingoophorectomy Left   . Abdominal hysterectomy    . Pelvic laparoscopy  1992    RSO, AND LSO ON 2007  . Cardiac catheterization      coronary spasm  . Transthoracic echocardiogram  07/2012    LV cavity size mildly reduced, normal wall motion; MV with calcified annulus and mild MR; LA mildly dilated; atrial septum with increased thickness - lipomatous  hypertrophy; RV systolic pressure increased (borderline pulm HTN)  . Cardiac catheterization N/A 08/28/2014    Procedure: Left Heart Cath and Coronary Angiography;  Surgeon: Jettie Booze, MD;  Location: [facility];  Service: Cardiovascular;  Laterality: N/A;    There were no vitals filed for this visit.  Visit Diagnosis:  Right leg weakness  Loss of balance  Weakness  Difficulty walking      Subjective Assessment - 05/03/15 1231    Subjective no falls or complaints.  Wants to do the "leg exercises" today   Patient Stated Goals Improve my balance and hamstring strength.   Currently in Pain? No/denies        Modified RLE SLS activities with min A and intermittent UE support: LLE on 4" step with horizontal/vertical head turns with unsuccessful max cues to slow down.  Trunk Rotation x 10 arms out forward flexion and unsuccessful cues to slow down.  Marching with attempted no UE support; then 1 UE support and pt continued to use bil UE support: in parallel bars with max cues for sequencing, technique and to slow down.  Static SLS on RLE with UE support: attempted hip hiking for glut med activation but pt with heavy reliance on UEs and not following instructions.  Attempted static LLE hip abdct for R  glut med activation and pt again unable to perform with max cues.    During SLS activities (which pt requested to perform); pt expressed frustration that "no one has helped me move my leg."  Upon further questioning appears pt wanted assistance for proper RLE sequencing during portions of gait; specifically with knee flexion at toe off.  Reminded pt that this specific exercise was performed last session and pt without recall of exercises.  Performed standing therapist assisted knee flexion of RLE with cues for pt to perform eccentric control with lowering.  Pt unable to control descent with minimal activation of hamstrings.  Feel pt's inability to flex knee at toe off unlikely to  change at this point.  Pt continues to have unrealistic expectations.  NuStep Level 10 x 5 min; cues to continue.                           PT Education - 05/03/15 1304    Education provided Yes   Education Details continued to reinforce end of POC and prognosis and recovery   Person(s) Educated Patient   Methods Explanation   Comprehension Verbalized understanding          PT Short Term Goals - 04/26/15 1453    PT SHORT TERM GOAL #1   Title I with initial HEP   Baseline needs mod to max cues   Status Not Met   PT SHORT TERM GOAL #2   Title patient able to amb with normal heel strike.   Status Achieved           PT Long Term Goals - 03/29/15 1450    PT LONG TERM GOAL #1   Title improved RLE strength to 4+/5 to normalize gait.   Time 6   Period Weeks   Status New   PT LONG TERM GOAL #2   Title improve BERG score to 54/56   Time 6   Period Weeks   Status New   PT LONG TERM GOAL #3   Title Able to ambulate with normal swing phase and heel strike using SPC   Time 6   Period Weeks   Status New   PT LONG TERM GOAL #4   Title Able to climb stairs with a reciprocal gait pattern.   Time 6   Period Weeks   Status New   PT LONG TERM GOAL #5   Title Decreased pain at night by 50%.   Time 6   Period Weeks   Status New               Plan - 05/03/15 1306    Clinical Impression Statement Pt continues to have unrealistic expectations about prognosis and recovery from transverse myelitis.  Pt with minimal hamstring activation in standing indicating gait unlikely to change.  Pt at times appears aggravated with exercises in clinc (especially with single limb exercises despite her asking for these activities) and does not understand benefit of specific exercises.  Extemsive discussions with pt about goal of specific exercises but feel pt doesn't fully understand.  Recommend personal training sessions at [facility] after d/c as pt willing to pay out of  pocket for therapy, but feel pt has maximized benefits of PT and ready for transition to HEP.  Pt continues expect 100% full recovery.  Minimal hamstring activation noted with static standing exercises.   PT Next Visit Plan do leg press! address R LE stiffness, high level dynamic balance,  glute and hip strength in RLE. Consider SLS and other challenges to isolate and address RLE weakness. begin checking goals   Consulted and Agree with Plan of Care Patient        Problem List Patient Active Problem List   Diagnosis Date Noted  . Abdominal pain 12/14/2014  . Exertional angina (HCC)   . Baker's cyst of knee 02/14/2013  . Coronary artery spasm ([place]) 11/19/2012  . Transverse myelitis ([place]) 10/10/2012  . HTN (hypertension) 08/29/2012  . Arthritis of neck ([place]) 08/29/2012  . Urinary retention 02/20/2012  . Leg weakness 02/20/2012  . Adenocarcinoma of unknown primary ([place]) 02/06/2011  . Anxiety 05/19/2010  . CAD 12/03/2009  . GERD 12/03/2009   Laureen Abrahams, PT, DPT 05/03/2015 3:13 PM  [facility] [address] [place], [place], [address] Phone: [phone]   Fax:  [phone]  Name: Ellanie Oppedisano Prothero MRN: [id number] Date of Birth: 1941/01/25

## 2015-05-07 ENCOUNTER — Ambulatory Visit: Payer: Medicare Other | Admitting: Physical Therapy

## 2015-05-10 ENCOUNTER — Ambulatory Visit: Payer: Medicare Other | Attending: Rehabilitation | Admitting: Physical Therapy

## 2015-05-11 ENCOUNTER — Ambulatory Visit: Payer: Medicare Other | Admitting: Family Medicine

## 2015-05-12 ENCOUNTER — Encounter: Payer: Self-pay | Admitting: Family Medicine

## 2015-05-14 ENCOUNTER — Ambulatory Visit: Payer: Medicare Other | Admitting: Physical Therapy

## 2015-05-17 ENCOUNTER — Ambulatory Visit: Payer: Medicare Other | Admitting: Physical Therapy

## 2015-05-26 ENCOUNTER — Encounter: Payer: Self-pay | Admitting: Family Medicine

## 2015-05-26 ENCOUNTER — Ambulatory Visit (INDEPENDENT_AMBULATORY_CARE_PROVIDER_SITE_OTHER): Payer: Medicare Other | Admitting: Family Medicine

## 2015-05-26 ENCOUNTER — Ambulatory Visit (INDEPENDENT_AMBULATORY_CARE_PROVIDER_SITE_OTHER): Payer: Medicare Other

## 2015-05-26 ENCOUNTER — Other Ambulatory Visit: Payer: Self-pay | Admitting: Cardiovascular Disease

## 2015-05-26 VITALS — BP 132/73 | HR 57 | Temp 97.0°F | Ht 63.0 in | Wt 164.0 lb

## 2015-05-26 DIAGNOSIS — I25119 Atherosclerotic heart disease of native coronary artery with unspecified angina pectoris: Secondary | ICD-10-CM

## 2015-05-26 DIAGNOSIS — R05 Cough: Secondary | ICD-10-CM | POA: Diagnosis not present

## 2015-05-26 DIAGNOSIS — R072 Precordial pain: Secondary | ICD-10-CM

## 2015-05-26 DIAGNOSIS — R059 Cough, unspecified: Secondary | ICD-10-CM

## 2015-05-26 MED ORDER — AMOXICILLIN-POT CLAVULANATE 875-125 MG PO TABS: 1.0000 | ORAL_TABLET | Freq: Two times a day (BID) | ORAL | Status: DC

## 2015-05-26 MED ORDER — GUAIFENESIN-CODEINE 100-10 MG/5ML PO SYRP: 5.0000 mL | ORAL_SOLUTION | ORAL | Status: DC | PRN

## 2015-05-26 NOTE — Telephone Encounter (Signed)
Rx request sent to pharmacy.  

## 2015-05-26 NOTE — Progress Notes (Signed)
Subjective:  Patient ID: Amy Black, female    DOB: [birth year]  Age: 75 y.o. MRN: [id number]  CC: Cough   HPI Amy Black presents for  Patient presents with dry cough runny stuffy nose. Diffuse headache of moderate intensity. Patient also has chills and subjective fever. Body aches worst in the back but present in the legs, shoulders, and torso as well. Has sapped the energy to the point that of being unable to perform usual activities other than ADLs. Onset 2 days ago.  LAst night sitting on bed. Laid down, and as she did, 9/10 sharp pain occurred at left anterior axilla radiating into the left anterior chest above the breast. The pain settled into the upper chest as a sharp 3-4/10 sensation lasting until presentation. It is not associated with nausea, diaphoresis or radiation to the neck, shoulder orarm. It is not related to exertion. However, the patient has a known history of ASCVD and coronary spasm. Left heart cath of7/2016 reviewed. Showed no significant stenosis. LVEF 55-65.    History [name] has a past medical history of CAD (coronary artery disease); Irritable bowel syndrome; Diverticulosis of colon (without mention of hemorrhage); hepatic cyst; Hyperlipidemia; Adenosquamous carcinoma; HTN (hypertension); History of cardiac catheterization; Unstable angina ([name]); Adenocarcinoma ([name]); Insomnia; Vitamin D deficiency; Adenocarcinoma of unknown primary ([name]) (02/06/2011); History of nuclear stress test (04/2011); and Coronary artery spasm ([name]).   She has past surgical history that includes Resection soft tissue tumor leg / ankle radical (2004); Diagnostic laparoscopy; Lysis of adhesion; Salpingoophorectomy (Left); Abdominal hysterectomy; Pelvic laparoscopy (1992); Cardiac catheterization; transthoracic echocardiogram (07/2012); and Cardiac catheterization (N/A, 08/28/2014).   Her family history includes Cancer in her mother; Heart disease in her father and mother; Hypertension in  her mother; Stroke in her father. There is no history of Colon cancer.She reports that she quit smoking about 42 years ago. Her smoking use included Cigarettes. She started smoking about 46 years ago. She has a 2 pack-year smoking history. She has never used smokeless tobacco. She reports that she does not drink alcohol or use illicit drugs.    ROS Review of Systems  Constitutional: Negative for fever, chills, activity change and appetite change.  HENT: Positive for congestion, postnasal drip, rhinorrhea and sinus pressure. Negative for ear discharge, ear pain, hearing loss, nosebleeds, sneezing and trouble swallowing.   Respiratory: Negative for chest tightness and shortness of breath.   Cardiovascular: Negative for chest pain and palpitations.  Skin: Negative for rash.    Objective:  BP 132/73 mmHg  Pulse 57  Temp(Src) 97 F (36.1 C) (Oral)  Ht 5\' 3"  (1.6 m)  Wt 164 lb (74.39 kg)  BMI 29.06 kg/m2  BP Readings from Last 3 Encounters:  05/26/15 132/73  04/14/15 130/80  04/08/15 131/80    Wt Readings from Last 3 Encounters:  05/26/15 164 lb (74.39 kg)  04/14/15 156 lb (70.761 kg)  04/08/15 154 lb 12.8 oz (70.217 kg)     Physical Exam  Constitutional: She appears well-developed and well-nourished.  HENT:  Head: Normocephalic and atraumatic.  Right Ear: Tympanic membrane and external ear normal. No decreased hearing is noted.  Left Ear: Tympanic membrane and external ear normal. No decreased hearing is noted.  Nose: Mucosal edema present. Right sinus exhibits no frontal sinus tenderness. Left sinus exhibits no frontal sinus tenderness.  Mouth/Throat: No oropharyngeal exudate or posterior oropharyngeal erythema.  Neck: No Brudzinski's sign noted.  Pulmonary/Chest: Breath sounds normal. No respiratory distress.  Lymphadenopathy:  Head (right side): No preauricular adenopathy present.       Head (left side): No preauricular adenopathy present.       Right cervical: No  superficial cervical adenopathy present.      Left cervical: No superficial cervical adenopathy present.     Lab Results  Component Value Date   WBC 5.2 01/18/2015   HGB 12.2 01/18/2015   HCT 37.6 01/18/2015   PLT 355 01/18/2015   GLUCOSE 87 01/18/2015   CHOL 203* 08/29/2012   TRIG 131 08/29/2012   HDL 68 08/29/2012   LDLCALC 109* 08/29/2012   ALT <9 01/18/2015   AST 12 01/18/2015   NA 140 01/18/2015   K 4.1 01/18/2015   CL 100 12/14/2014   CREATININE 0.8 01/18/2015   BUN 14.6 01/18/2015   CO2 27 01/18/2015   TSH 0.911 09/10/2013   INR 1.0 08/24/2014    Ct Lumbar Spine W Contrast  02/23/2015  CLINICAL DATA:  Low back pain.  RIGHT leg pain. EXAM: LUMBAR MYELOGRAM FLUOROSCOPY TIME:  53 seconds. PROCEDURE: After thorough discussion of risks and benefits of the procedure including bleeding, infection, injury to nerves, blood vessels, adjacent structures as well as headache and CSF leak, written and oral informed consent was obtained. Consent was obtained by Dr. Rolla Flatten. Time out form was completed. Patient was positioned prone on the fluoroscopy table. Local anesthesia was provided with 1% lidocaine without epinephrine after prepped and draped in the usual sterile fashion. Puncture was performed at L3-L4 using a 3 1/2 inch 22-gauge spinal needle via midline approach. Using a single pass through the dura, the needle was placed within the thecal sac, with return of clear CSF. 15 mL of Isovue M 200 was injected into the thecal sac, with normal opacification of the nerve roots and cauda equina consistent with free flow within the subarachnoid space. I personally performed the lumbar puncture and administered the intrathecal contrast. I also personally supervised acquisition of the myelogram images. TECHNIQUE: Contiguous axial images were obtained through the Lumbar spine after the intrathecal infusion of infusion. Coronal and sagittal reconstructions were obtained of the axial image sets.  COMPARISON:  MRI lumbar spine 01/19/2014. FINDINGS: LUMBAR MYELOGRAM FINDINGS: Good opacification lumbar subarachnoid space. Transitional anatomy. L5 is partially sacralized due to hypertrophied transverse process on the RIGHT. Anatomic alignment. Slight subarticular zone narrowing at L3-4 bilaterally. No frank L4 nerve root cut off. No other areas of significant extradural defect. Incidental Tarlov cysts. Standing flexion extension demonstrates no dynamic instability. CT LUMBAR MYELOGRAM FINDINGS: Transitional anatomy. Anatomic alignment. No paravertebral masses. Aortic atherosclerosis. No worrisome osseous lesion. Normal-appearing conus terminates at L1-L2. L1-L2:  Normal. L2-L3: Mild bulge. Mild posterior element hypertrophy. No impingement. L3-L4: Mild bulge. Mild facet arthropathy. Slight subarticular zone narrowing without frank L4 nerve root cut off. L4-L5:  Mild bulge.  Mild facet arthropathy.  No impingement. L5-S1:  Transitional interspace.  No impingement. IMPRESSION: LUMBAR MYELOGRAM IMPRESSION: Transitional anatomy. L5-S1 disc space hypoplasia due to partial sacralization of L5 on the RIGHT. CT LUMBAR MYELOGRAM IMPRESSION: Mild bulge at L3-4 with mild facet arthropathy. No impingement is evident. Electronically Signed   By: Staci Righter M.D.   On: 02/23/2015 15:43   Dg Myelography Lumbar Inj Lumbosacral  02/23/2015  CLINICAL DATA:  Low back pain.  RIGHT leg pain. EXAM: LUMBAR MYELOGRAM FLUOROSCOPY TIME:  53 seconds. PROCEDURE: After thorough discussion of risks and benefits of the procedure including bleeding, infection, injury to nerves, blood vessels, adjacent structures as well as headache  and CSF leak, written and oral informed consent was obtained. Consent was obtained by Dr. Rolla Flatten. Time out form was completed. Patient was positioned prone on the fluoroscopy table. Local anesthesia was provided with 1% lidocaine without epinephrine after prepped and draped in the usual sterile fashion.  Puncture was performed at L3-L4 using a 3 1/2 inch 22-gauge spinal needle via midline approach. Using a single pass through the dura, the needle was placed within the thecal sac, with return of clear CSF. 15 mL of Isovue M 200 was injected into the thecal sac, with normal opacification of the nerve roots and cauda equina consistent with free flow within the subarachnoid space. I personally performed the lumbar puncture and administered the intrathecal contrast. I also personally supervised acquisition of the myelogram images. TECHNIQUE: Contiguous axial images were obtained through the Lumbar spine after the intrathecal infusion of infusion. Coronal and sagittal reconstructions were obtained of the axial image sets. COMPARISON:  MRI lumbar spine 01/19/2014. FINDINGS: LUMBAR MYELOGRAM FINDINGS: Good opacification lumbar subarachnoid space. Transitional anatomy. L5 is partially sacralized due to hypertrophied transverse process on the RIGHT. Anatomic alignment. Slight subarticular zone narrowing at L3-4 bilaterally. No frank L4 nerve root cut off. No other areas of significant extradural defect. Incidental Tarlov cysts. Standing flexion extension demonstrates no dynamic instability. CT LUMBAR MYELOGRAM FINDINGS: Transitional anatomy. Anatomic alignment. No paravertebral masses. Aortic atherosclerosis. No worrisome osseous lesion. Normal-appearing conus terminates at L1-L2. L1-L2:  Normal. L2-L3: Mild bulge. Mild posterior element hypertrophy. No impingement. L3-L4: Mild bulge. Mild facet arthropathy. Slight subarticular zone narrowing without frank L4 nerve root cut off. L4-L5:  Mild bulge.  Mild facet arthropathy.  No impingement. L5-S1:  Transitional interspace.  No impingement. IMPRESSION: LUMBAR MYELOGRAM IMPRESSION: Transitional anatomy. L5-S1 disc space hypoplasia due to partial sacralization of L5 on the RIGHT. CT LUMBAR MYELOGRAM IMPRESSION: Mild bulge at L3-4 with mild facet arthropathy. No impingement is  evident. Electronically Signed   By: Staci Righter M.D.   On: 02/23/2015 15:43    Assessment & Plan:   Rikki-Lee was seen today for cough.  Diagnoses and all orders for this visit:  Atherosclerosis of native coronary artery of native heart with angina pectoris (HCC)  Precordial pain -     EKG 12-Lead -     DG Chest 2 View; Future  Cough  Other orders -     amoxicillin-clavulanate (AUGMENTIN) 875-125 MG tablet; Take 1 tablet by mouth 2 (two) times daily. Take all of this medication -     guaiFENesin-codeine (CHERATUSSIN AC) 100-10 MG/5ML syrup; Take 5 mLs by mouth every 4 (four) hours as needed for cough.   EKG - NSR no ischemic change, nml axes, LAE CXR - no acute disease   I am having Ms. Santistevan start on amoxicillin-clavulanate and guaiFENesin-codeine. I am also having her maintain her aspirin, cetirizine, Cholecalciferol (VITAMIN D PO), isosorbide mononitrate, fluticasone, triamterene-hydrochlorothiazide, traMADol, diphenoxylate-atropine, NITROSTAT, Vitamin D (Ergocalciferol), ALPRAZolam, and verapamil.  Meds ordered this encounter  Medications  . amoxicillin-clavulanate (AUGMENTIN) 875-125 MG tablet    Sig: Take 1 tablet by mouth 2 (two) times daily. Take all of this medication    Dispense:  20 tablet    Refill:  0  . guaiFENesin-codeine (CHERATUSSIN AC) 100-10 MG/5ML syrup    Sig: Take 5 mLs by mouth every 4 (four) hours as needed for cough.    Dispense:  180 mL    Refill:  0     Follow-up: Return in about 3 months (around 08/25/2015),  or if symptoms worsen or fail to improve, for CAD, hypertension.  [name], M.D.

## 2015-06-03 DIAGNOSIS — M5136 Other intervertebral disc degeneration, lumbar region: Secondary | ICD-10-CM | POA: Diagnosis not present

## 2015-06-03 DIAGNOSIS — M47817 Spondylosis without myelopathy or radiculopathy, lumbosacral region: Secondary | ICD-10-CM | POA: Diagnosis not present

## 2015-06-03 DIAGNOSIS — M791 Myalgia: Secondary | ICD-10-CM | POA: Diagnosis not present

## 2015-06-08 DIAGNOSIS — M5416 Radiculopathy, lumbar region: Secondary | ICD-10-CM | POA: Diagnosis not present

## 2015-06-11 ENCOUNTER — Encounter: Payer: Self-pay | Admitting: Physical Therapy

## 2015-06-11 ENCOUNTER — Other Ambulatory Visit: Payer: Self-pay | Admitting: Family Medicine

## 2015-06-11 NOTE — Therapy (Signed)
[facility] [address] [place], [place], [address] Phone: [phone]   Fax:  [phone]  Patient Details  Name: Amy Black MRN: [id number] Date of Birth: Jul 07, 1940 Referring Provider:  No ref. provider found  Encounter Date: 06/11/2015   PHYSICAL THERAPY DISCHARGE SUMMARY  Visits from Start of Care: 9  Current functional level related to goals / functional outcomes: PT LONG TERM GOAL #1    Title improved RLE strength to 4+/5 to normalize gait.   Time 6   Period Weeks   Status New   PT LONG TERM GOAL #2   Title improve BERG score to 54/56   Time 6   Period Weeks   Status New   PT LONG TERM GOAL #3   Title Able to ambulate with normal swing phase and heel strike using SPC   Time 6   Period Weeks   Status New   PT LONG TERM GOAL #4   Title Able to climb stairs with a reciprocal gait pattern.   Time 6   Period Weeks   Status New   PT LONG TERM GOAL #5   Title Decreased pain at night by 50%.   Time 6   Period Weeks   Status New                 Pt did not show for last scheduled appointment to assess goals; therefore unknown if goals met.   Remaining deficits: Pt continues to have unrealistic expectations about prognosis and recovery from transverse myelitis. Pt with minimal hamstring activation in standing indicating gait unlikely to change. Pt at times appears aggravated with exercises in clinc (especially with single limb exercises despite her asking for these activities) and does not understand benefit of specific exercises. Extemsive discussions with pt about goal of specific exercises but feel pt doesn't fully understand. Recommend personal training sessions at [facility] after d/c as pt willing to pay out of pocket for therapy, but feel pt has maximized benefits of PT and ready for transition to HEP. Pt continues expect  100% full recovery.     Education / Equipment: HEP, recommendation for personal training services at [facility] [employer]: Patient agrees to discharge.  Patient goals were not met. Patient is being discharged due to lack of progress.  ?????       Laureen Abrahams, PT, DPT 06/11/2015 11:25 AM  [facility] [address] [place], [place], [address] Phone: [phone]   Fax:  [phone]

## 2015-06-14 ENCOUNTER — Other Ambulatory Visit: Payer: Self-pay | Admitting: *Deleted

## 2015-06-14 MED ORDER — DIPHENOXYLATE-ATROPINE 2.5-0.025 MG PO TABS: ORAL_TABLET | ORAL | Status: DC

## 2015-06-14 NOTE — Progress Notes (Signed)
RX for Lomotil called into [facility] per pt request Okayed per Dr Livia Snellen

## 2015-06-14 NOTE — Telephone Encounter (Signed)
Patient last seen in office on 4-19. Rx last filled on 06/11/15 for #30. Please advise. If approved please route to pool B so nurse can phone in to pharmacy

## 2015-06-16 ENCOUNTER — Ambulatory Visit: Payer: Medicare Other | Admitting: Family Medicine

## 2015-06-17 ENCOUNTER — Other Ambulatory Visit: Payer: Self-pay | Admitting: Family Medicine

## 2015-06-17 ENCOUNTER — Ambulatory Visit (INDEPENDENT_AMBULATORY_CARE_PROVIDER_SITE_OTHER): Payer: Medicare Other | Admitting: Family Medicine

## 2015-06-17 ENCOUNTER — Encounter: Payer: Self-pay | Admitting: Family Medicine

## 2015-06-17 VITALS — BP 105/70 | HR 79 | Temp 97.8°F | Ht 63.0 in | Wt 157.8 lb

## 2015-06-17 DIAGNOSIS — H938X2 Other specified disorders of left ear: Secondary | ICD-10-CM | POA: Diagnosis not present

## 2015-06-17 NOTE — Progress Notes (Signed)
BP 105/70 mmHg  Pulse 79  Temp(Src) 97.8 F (36.6 C) (Oral)  Ht 5\' 3"  (1.6 m)  Wt 157 lb 12.8 oz (71.578 kg)  BMI 27.96 kg/m2   Subjective:    Patient ID: Amy Black, female    DOB: Dec 08, 1940, 75 y.o.   MRN: [id number]  HPI: Amy Black is a 75 y.o. female presenting on 06/17/2015 for Stinging sensation in left side of head   HPI Ear congestion left and headache left Patient comes in today because she's had a stinging sharp pain on the left side of her head near her ear. She admits that recently she has been dealing with sinus congestion and postnasal drainage and allergies that are causing her to have pressure up front. That has been relieved with Tylenol Sinus but now she has this pain and stinging on the left side of her head. She says the pain is 3 out of 10 but is persistent. She denies any fevers or chills or shortness of breath or ear drainage. She does admit that her hearing on the left side has been muffled but she also has decreased hearing normally. She denies any sick contacts that she knows of. She does have Flonase but hasn't been using it yet.  Relevant past medical, surgical, family and social history reviewed and updated as indicated. Interim medical history since our last visit reviewed. Allergies and medications reviewed and updated.  Review of Systems  Constitutional: Negative for fever and chills.  HENT: Positive for congestion, ear pain, postnasal drip, rhinorrhea, sinus pressure, sneezing and sore throat. Negative for ear discharge.   Eyes: Negative for pain, redness and visual disturbance.  Respiratory: Positive for cough. Negative for chest tightness and shortness of breath.   Cardiovascular: Negative for chest pain and leg swelling.  Genitourinary: Negative for dysuria and difficulty urinating.  Musculoskeletal: Negative for back pain and gait problem.  Skin: Negative for rash.  Neurological: Positive for headaches. Negative for light-headedness.    Psychiatric/Behavioral: Negative for behavioral problems and agitation.  All other systems reviewed and are negative.   Per HPI unless specifically indicated above     Medication List       This list is accurate as of: 06/17/15  1:58 PM.  Always use your most recent med list.               ALPRAZolam 0.5 MG tablet  Commonly known as:  XANAX  TAKE ONE TABLET BY MOUTH THREE TIMES DAILY     aspirin 81 MG tablet  Take 1 tablet (81 mg total) by mouth daily.     cetirizine 10 MG tablet  Commonly known as:  ZYRTEC  Take 1 tablet (10 mg total) by mouth 2 (two) times daily.     diphenoxylate-atropine 2.5-0.025 MG tablet  Commonly known as:  LOMOTIL  TAKE ONE TABLET BY MOUTH FOUR TIMES DAILY AS NEEDED FOR DIARRHEA FOR LOOSE STOOLS.     fluticasone 50 MCG/ACT nasal spray  Commonly known as:  FLONASE  Place 2 sprays into both nostrils daily.     isosorbide mononitrate 30 MG 24 hr tablet  Commonly known as:  IMDUR  Take 0.5 tablets (15 mg total) by mouth daily.     NITROSTAT 0.4 MG SL tablet  Generic drug:  nitroGLYCERIN  DISSOLVE ONE TABLET UNDER TONGUE EVERY 5 MINUTES UP TO 3 DOSES AS NEEDED FOR CHEST PAIN     traMADol 50 MG tablet  Commonly known as:  Veatrice Bourbon  TAKE ONE TABLET BY MOUTH EVERY TWELVE HOURS AS NEEDED.     triamterene-hydrochlorothiazide 37.5-25 MG capsule  Commonly known as:  DYAZIDE  Take 1 each (1 capsule total) by mouth daily.     verapamil 80 MG tablet  Commonly known as:  CALAN  TAKE ONE TABLET BY MOUTH FOUR TIMES DAILY     Vitamin D (Ergocalciferol) 50000 units Caps capsule  Commonly known as:  DRISDOL  TAKE ONE CAPSULE BY MOUTH EVERY 7 DAYS     VITAMIN D PO  Take 1 capsule by mouth daily. OTC           Objective:    BP 105/70 mmHg  Pulse 79  Temp(Src) 97.8 F (36.6 C) (Oral)  Ht 5\' 3"  (1.6 m)  Wt 157 lb 12.8 oz (71.578 kg)  BMI 27.96 kg/m2  Wt Readings from Last 3 Encounters:  06/17/15 157 lb 12.8 oz (71.578 kg)  05/26/15 164 lb  (74.39 kg)  04/14/15 156 lb (70.761 kg)    Physical Exam  Constitutional: She is oriented to person, place, and time. She appears well-developed and well-nourished. No distress.  HENT:  Right Ear: Tympanic membrane, external ear and ear canal normal.  Left Ear: External ear and ear canal normal. Tympanic membrane is bulging. Tympanic membrane is not injected, not erythematous and not retracted.  Nose: Mucosal edema and rhinorrhea present. No epistaxis. Right sinus exhibits no maxillary sinus tenderness and no frontal sinus tenderness. Left sinus exhibits no maxillary sinus tenderness and no frontal sinus tenderness.  Mouth/Throat: Uvula is midline and mucous membranes are normal. Posterior oropharyngeal edema and posterior oropharyngeal erythema present. No oropharyngeal exudate or tonsillar abscesses.  Eyes: Conjunctivae and EOM are normal.  Neck: Neck supple. No thyromegaly present.  Cardiovascular: Normal rate, regular rhythm, normal heart sounds and intact distal pulses.   No murmur heard. Pulmonary/Chest: Effort normal and breath sounds normal. No respiratory distress. She has no wheezes.  Musculoskeletal: Normal range of motion. She exhibits no edema or tenderness.  Lymphadenopathy:    She has no cervical adenopathy.  Neurological: She is alert and oriented to person, place, and time. Coordination normal.  Skin: Skin is warm and dry. No rash noted. She is not diaphoretic.  Psychiatric: She has a normal mood and affect. Her behavior is normal.  Vitals reviewed.   Results for orders placed or performed in visit on 02/19/15  [facility] MAMMOGRAPHY  Result Value Ref Range   [facility] Mammogram Benign       Assessment & Plan:   Problem List Items Addressed This Visit    None    Visit Diagnoses    Ear congestion, left    -  Primary    Recommended Flonase, Mucinex, Zyrtec, nasal saline. This should clear it up in 3-4 days if not give [place] call back        Follow up plan: Return if symptoms  worsen or fail to improve.  Counseling provided for all of the vaccine components No orders of the defined types were placed in this encounter.    Caryl Pina, MD [facility] 06/17/2015, 1:58 PM

## 2015-06-25 ENCOUNTER — Telehealth: Payer: Self-pay | Admitting: Family Medicine

## 2015-06-25 DIAGNOSIS — I10 Essential (primary) hypertension: Secondary | ICD-10-CM

## 2015-06-25 DIAGNOSIS — I25119 Atherosclerotic heart disease of native coronary artery with unspecified angina pectoris: Secondary | ICD-10-CM

## 2015-06-25 NOTE — Telephone Encounter (Signed)
We can go ahead and put and labs for a chem panel, thyroid, vitamin D, CBC, lipid for her

## 2015-06-26 ENCOUNTER — Other Ambulatory Visit: Payer: Medicare Other

## 2015-06-26 DIAGNOSIS — I25119 Atherosclerotic heart disease of native coronary artery with unspecified angina pectoris: Secondary | ICD-10-CM

## 2015-06-26 DIAGNOSIS — R1030 Lower abdominal pain, unspecified: Secondary | ICD-10-CM | POA: Diagnosis not present

## 2015-06-26 DIAGNOSIS — I10 Essential (primary) hypertension: Secondary | ICD-10-CM

## 2015-06-27 LAB — CBC WITH DIFFERENTIAL/PLATELET
Basophils Absolute: 0 10*3/uL (ref 0.0–0.2)
Basos: 1 %
EOS (ABSOLUTE): 0.2 10*3/uL (ref 0.0–0.4)
Eos: 3 %
Hematocrit: 38 % (ref 34.0–46.6)
Hemoglobin: 11.6 g/dL (ref 11.1–15.9)
Immature Grans (Abs): 0 10*3/uL (ref 0.0–0.1)
Immature Granulocytes: 0 %
Lymphocytes Absolute: 2.6 10*3/uL (ref 0.7–3.1)
Lymphs: 42 %
MCH: 30 pg (ref 26.6–33.0)
MCHC: 30.5 g/dL — ABNORMAL LOW (ref 31.5–35.7)
MCV: 98 fL — ABNORMAL HIGH (ref 79–97)
Monocytes Absolute: 0.6 10*3/uL (ref 0.1–0.9)
Monocytes: 9 %
Neutrophils Absolute: 2.9 10*3/uL (ref 1.4–7.0)
Neutrophils: 45 %
Platelets: 447 10*3/uL — ABNORMAL HIGH (ref 150–379)
RBC: 3.87 x10E6/uL (ref 3.77–5.28)
RDW: 14.8 % (ref 12.3–15.4)
WBC: 6.2 10*3/uL (ref 3.4–10.8)

## 2015-06-28 LAB — CMP14+EGFR
ALT: 9 IU/L (ref 0–32)
AST: 14 IU/L (ref 0–40)
Albumin/Globulin Ratio: 1.5 (ref 1.2–2.2)
Albumin: 4.1 g/dL (ref 3.5–4.8)
Alkaline Phosphatase: 94 IU/L (ref 39–117)
BUN/Creatinine Ratio: 13 (ref 12–28)
BUN: 12 mg/dL (ref 8–27)
Bilirubin Total: 0.4 mg/dL (ref 0.0–1.2)
CO2: 24 mmol/L (ref 18–29)
Calcium: 9.7 mg/dL (ref 8.7–10.3)
Chloride: 98 mmol/L (ref 96–106)
Creatinine, Ser: 0.96 mg/dL (ref 0.57–1.00)
GFR calc Af Amer: 68 mL/min/{1.73_m2} (ref >59)
GFR calc non Af Amer: 59 mL/min/{1.73_m2} — ABNORMAL LOW (ref >59)
Globulin, Total: 2.8 g/dL (ref 1.5–4.5)
Glucose: 78 mg/dL (ref 65–99)
Potassium: 4.9 mmol/L (ref 3.5–5.2)
Sodium: 139 mmol/L (ref 134–144)
Total Protein: 6.9 g/dL (ref 6.0–8.5)

## 2015-06-28 LAB — LIPID PANEL
Chol/HDL Ratio: 2.5 ratio units (ref 0.0–4.4)
Cholesterol, Total: 213 mg/dL — ABNORMAL HIGH (ref 100–199)
HDL: 86 mg/dL (ref >39)
LDL Calculated: 112 mg/dL — ABNORMAL HIGH (ref 0–99)
Triglycerides: 73 mg/dL (ref 0–149)
VLDL Cholesterol Cal: 15 mg/dL (ref 5–40)

## 2015-06-28 LAB — THYROID PANEL WITH TSH
Free Thyroxine Index: 2 (ref 1.2–4.9)
T3 Uptake Ratio: 29 % (ref 24–39)
T4, Total: 6.9 ug/dL (ref 4.5–12.0)
TSH: 2.01 u[IU]/mL (ref 0.450–4.500)

## 2015-06-28 LAB — VITAMIN D 25 HYDROXY (VIT D DEFICIENCY, FRACTURES): Vit D, 25-Hydroxy: 31.4 ng/mL (ref 30.0–100.0)

## 2015-07-05 ENCOUNTER — Encounter (HOSPITAL_COMMUNITY): Payer: Self-pay | Admitting: Emergency Medicine

## 2015-07-05 ENCOUNTER — Emergency Department (HOSPITAL_COMMUNITY)
Admission: EM | Admit: 2015-07-05 | Discharge: 2015-07-06 | Disposition: A | Discharge status: Home / Self Care | Payer: Medicare Other | Attending: Emergency Medicine | Admitting: Emergency Medicine

## 2015-07-05 DIAGNOSIS — Z7982 Long term (current) use of aspirin: Secondary | ICD-10-CM | POA: Insufficient documentation

## 2015-07-05 DIAGNOSIS — I10 Essential (primary) hypertension: Secondary | ICD-10-CM | POA: Insufficient documentation

## 2015-07-05 DIAGNOSIS — I2510 Atherosclerotic heart disease of native coronary artery without angina pectoris: Secondary | ICD-10-CM | POA: Diagnosis not present

## 2015-07-05 DIAGNOSIS — Z79899 Other long term (current) drug therapy: Secondary | ICD-10-CM | POA: Insufficient documentation

## 2015-07-05 DIAGNOSIS — N200 Calculus of kidney: Secondary | ICD-10-CM | POA: Diagnosis not present

## 2015-07-05 DIAGNOSIS — Z87891 Personal history of nicotine dependence: Secondary | ICD-10-CM | POA: Diagnosis not present

## 2015-07-05 DIAGNOSIS — E785 Hyperlipidemia, unspecified: Secondary | ICD-10-CM | POA: Insufficient documentation

## 2015-07-05 DIAGNOSIS — R197 Diarrhea, unspecified: Secondary | ICD-10-CM | POA: Insufficient documentation

## 2015-07-05 DIAGNOSIS — N281 Cyst of kidney, acquired: Secondary | ICD-10-CM | POA: Diagnosis not present

## 2015-07-05 NOTE — ED Notes (Signed)
Pt c/o diarrhea since yesterday. Pt states she has taken lomotil and imodium with no results.

## 2015-07-06 ENCOUNTER — Emergency Department (HOSPITAL_COMMUNITY): Payer: Medicare Other

## 2015-07-06 DIAGNOSIS — N200 Calculus of kidney: Secondary | ICD-10-CM | POA: Diagnosis not present

## 2015-07-06 DIAGNOSIS — N281 Cyst of kidney, acquired: Secondary | ICD-10-CM | POA: Diagnosis not present

## 2015-07-06 LAB — COMPREHENSIVE METABOLIC PANEL
ALT: 11 U/L — ABNORMAL LOW (ref 14–54)
AST: 17 U/L (ref 15–41)
Albumin: 4.1 g/dL (ref 3.5–5.0)
Alkaline Phosphatase: 81 U/L (ref 38–126)
Anion gap: 3 — ABNORMAL LOW (ref 5–15)
BUN: 13 mg/dL (ref 6–20)
CO2: 30 mmol/L (ref 22–32)
Calcium: 9.4 mg/dL (ref 8.9–10.3)
Chloride: 103 mmol/L (ref 101–111)
Creatinine, Ser: 0.94 mg/dL (ref 0.44–1.00)
GFR calc Af Amer: >60 mL/min (ref >60)
GFR calc non Af Amer: 59 mL/min — ABNORMAL LOW (ref >60)
Glucose, Bld: 100 mg/dL — ABNORMAL HIGH (ref 65–99)
Potassium: 4.7 mmol/L (ref 3.5–5.1)
Sodium: 136 mmol/L (ref 135–145)
Total Bilirubin: 0.3 mg/dL (ref 0.3–1.2)
Total Protein: 7.5 g/dL (ref 6.5–8.1)

## 2015-07-06 LAB — CBC WITH DIFFERENTIAL/PLATELET
Basophils Absolute: 0 10*3/uL (ref 0.0–0.1)
Basophils Relative: 0 %
Eosinophils Absolute: 0.4 10*3/uL (ref 0.0–0.7)
Eosinophils Relative: 5 %
HCT: 34.9 % — ABNORMAL LOW (ref 36.0–46.0)
Hemoglobin: 11.2 g/dL — ABNORMAL LOW (ref 12.0–15.0)
Lymphocytes Relative: 37 %
Lymphs Abs: 2.8 10*3/uL (ref 0.7–4.0)
MCH: 30.9 pg (ref 26.0–34.0)
MCHC: 32.1 g/dL (ref 30.0–36.0)
MCV: 96.4 fL (ref 78.0–100.0)
Monocytes Absolute: 0.7 10*3/uL (ref 0.1–1.0)
Monocytes Relative: 9 %
Neutro Abs: 3.6 10*3/uL (ref 1.7–7.7)
Neutrophils Relative %: 49 %
Platelets: 437 10*3/uL — ABNORMAL HIGH (ref 150–400)
RBC: 3.62 MIL/uL — ABNORMAL LOW (ref 3.87–5.11)
RDW: 14.1 % (ref 11.5–15.5)
WBC: 7.5 10*3/uL (ref 4.0–10.5)

## 2015-07-06 LAB — URINALYSIS, ROUTINE W REFLEX MICROSCOPIC
Bilirubin Urine: NEGATIVE
Glucose, UA: NEGATIVE mg/dL
Ketones, ur: NEGATIVE mg/dL
Nitrite: NEGATIVE
Protein, ur: NEGATIVE mg/dL
Specific Gravity, Urine: 1.01 (ref 1.005–1.030)
pH: 5.5 (ref 5.0–8.0)

## 2015-07-06 LAB — URINE MICROSCOPIC-ADD ON

## 2015-07-06 LAB — LIPASE, BLOOD: Lipase: 14 U/L (ref 11–51)

## 2015-07-06 LAB — POC OCCULT BLOOD, ED: Fecal Occult Bld: POSITIVE — AB

## 2015-07-06 MED ORDER — HYDROCORTISONE 2.5 % RE CREA: TOPICAL_CREAM | RECTAL | Status: DC

## 2015-07-06 NOTE — ED Provider Notes (Signed)
CSN: [id number]     Arrival date & time 07/05/15  2314 History  By signing my name below, I, Emmanuella Mensah, attest that this documentation has been prepared under the direction and in the presence of Ezequiel Essex, MD. Electronically Signed: Judithann Sauger, ED Scribe. 07/06/2015. 12:15 AM.    Chief Complaint  Patient presents with  . Diarrhea   The history is provided by the patient. No language interpreter was used.    HPI Comments: Amy Black is a 75 y.o. female with a hx of IBS, diverticulosis of colon, and HTN who presents to the Emergency Department complaining of 10+ episodes of diarrhea with blood upon wiping onset 4 pm yesterday. She reports associated lightheadedness. She explains that she has had these symptoms in the past due to her IBS but it normally does not last this long. No alleviating factors noted. Pt has tried Imodium and Lomotil with no relief. She states that she finished a course of antibiotics 3 weeks ago. She denies any anti-coagulants use. She also denies any recent travel, abdominal surgeries, or any recent hospitalizations. She reports an allergy to IVP dye. She denies any abdominal pain, change in appetite, or CP.     She also reports right foot pain with bilateral ankle swelling onset yesterday. She explains that her ankle is normally not swollen to this extent. She denies a hx of DVT/PE. She denies any SOB.  Past Medical History  Diagnosis Date  . CAD (coronary artery disease)   . Irritable bowel syndrome   . Diverticulosis of colon (without mention of hemorrhage)   . hepatic cyst   . Hyperlipidemia   . Adenosquamous carcinoma     left leg 2004 - radiation & resection  . HTN (hypertension)     PT. DENIES  . History of cardiac catheterization   . Unstable angina ([place])   . Adenocarcinoma ([facility])     LEFT LEG  . Insomnia   . Vitamin D deficiency   . Adenocarcinoma of unknown primary ([place]) 02/06/2011  . History of nuclear stress test 04/2011     lexiscan; normal study, no significant ischemia, low risk   . Coronary artery spasm [facility])    Past Surgical History  Procedure Laterality Date  . Resection soft tissue tumor leg / ankle radical  2004    leg lesion resection & radiation  . Diagnostic laparoscopy    . Lysis of adhesion    . Salpingoophorectomy Left   . Abdominal hysterectomy    . Pelvic laparoscopy  1992    RSO, AND LSO ON 2007  . Cardiac catheterization      coronary spasm  . Transthoracic echocardiogram  07/2012    LV cavity size mildly reduced, normal wall motion; MV with calcified annulus and mild MR; LA mildly dilated; atrial septum with increased thickness - lipomatous hypertrophy; RV systolic pressure increased (borderline pulm HTN)  . Cardiac catheterization N/A 08/28/2014    Procedure: Left Heart Cath and Coronary Angiography;  Surgeon: Jettie Booze, MD;  Location: [facility];  Service: Cardiovascular;  Laterality: N/A;   Family History  Problem Relation Age of Onset  . Diabetes      Multiple family members on both sides   . Coronary artery disease      Multiple family members on both sides   . Cancer Mother     Bladder  . Heart disease Mother   . Hypertension Mother   . Colon cancer Neg Hx   .  Heart disease Father   . Stroke Father    Social History  Substance Use Topics  . Smoking status: Former Smoker -- 0.50 packs/day for 4 years    Types: Cigarettes    Start date: 12/07/1968    Quit date: 11/20/1972  . Smokeless tobacco: Never Used     Comment: quit 40 years ago  . Alcohol Use: No   OB History    Gravida Para Term Preterm AB TAB SAB Ectopic Multiple Living   3 3        3      Review of Systems A complete 10 system review of systems was obtained and all systems are negative except as noted in the HPI and PMH.     Allergies  Cymbalta; Gabapentin; Ivp dye; and Zanaflex  Home Medications   Prior to Admission medications   Medication Sig Start Date End Date Taking?  Authorizing Provider  ALPRAZolam Duanne Moron) 0.5 MG tablet TAKE ONE TABLET BY MOUTH THREE TIMES DAILY 06/18/15   [name], MD  aspirin 81 MG tablet Take 1 tablet (81 mg total) by mouth daily. 02/16/12   Lavon Paganini Angiulli, PA-C  cetirizine (ZYRTEC) 10 MG tablet Take 1 tablet (10 mg total) by mouth 2 (two) times daily. Patient taking differently: Take 10 mg by mouth as needed.  08/18/13   Lysbeth Penner, FNP  Cholecalciferol (VITAMIN D PO) Take 1 capsule by mouth daily. OTC    Historical Provider, MD  diphenoxylate-atropine (LOMOTIL) 2.5-0.025 MG tablet TAKE ONE TABLET BY MOUTH FOUR TIMES DAILY AS NEEDED FOR DIARRHEA FOR LOOSE STOOLS. Patient not taking: Reported on 06/17/2015 06/14/15   [name], MD  fluticasone [facility]) 50 MCG/ACT nasal spray Place 2 sprays into both nostrils daily. Patient not taking: Reported on 06/17/2015 10/29/14   Tiffany A Gann, PA-C  hydrocortisone (ANUSOL-HC) 2.5 % rectal cream Apply rectally 2 times daily as needed 07/06/15   Ezequiel Essex, MD  isosorbide mononitrate (IMDUR) 30 MG 24 hr tablet Take 0.5 tablets (15 mg total) by mouth daily. 08/24/14   Scott T Weaver, PA-C  NITROSTAT 0.4 MG SL tablet DISSOLVE ONE TABLET UNDER TONGUE EVERY 5 MINUTES UP TO 3 DOSES AS NEEDED FOR CHEST PAIN Patient not taking: Reported on 04/08/2015 01/04/15   Pixie Casino, MD  traMADol (ULTRAM) 50 MG tablet TAKE ONE TABLET BY MOUTH EVERY TWELVE HOURS AS NEEDED. Patient not taking: Reported on 06/17/2015 11/30/14   [name], MD  triamterene-hydrochlorothiazide (DYAZIDE) 37.5-25 MG capsule Take 1 each (1 capsule total) by mouth daily. 11/10/14   Jerline Pain, MD  verapamil (CALAN) 80 MG tablet TAKE ONE TABLET BY MOUTH FOUR TIMES DAILY 05/26/15   Minus Breeding, MD  Vitamin D, Ergocalciferol, (DRISDOL) 50000 units CAPS capsule TAKE ONE CAPSULE BY MOUTH EVERY 7 DAYS Patient not taking: Reported on 06/17/2015 02/25/15   [name], MD   BP 122/73 mmHg  Pulse 70  Temp(Src) 98.4 F (36.9  C)  Resp 20  Ht 5\' 3"  (1.6 m)  Wt 157 lb (71.215 kg)  BMI 27.82 kg/m2  SpO2 100% Physical Exam  Constitutional: She is oriented to person, place, and time. She appears well-developed and well-nourished. No distress.  HENT:  Head: Normocephalic and atraumatic.  Mouth/Throat: Oropharynx is clear and moist. No oropharyngeal exudate.  Eyes: Conjunctivae and EOM are normal. Pupils are equal, round, and reactive to light.  Neck: Normal range of motion. Neck supple.  No meningismus.  Cardiovascular: Normal rate, regular rhythm, normal heart sounds  and intact distal pulses.   No murmur heard. Pulmonary/Chest: Effort normal and breath sounds normal. No respiratory distress.  Abdominal: Soft. There is no tenderness. There is no rebound and no guarding.  Genitourinary:  Chaperon present.  There is skin breakdown perianally, no gross blood from stool.   Musculoskeletal: Normal range of motion. She exhibits edema. She exhibits no tenderness.  Trace pedal edema, intact DP/PT pulses.   Neurological: She is alert and oriented to person, place, and time. No cranial nerve deficit. She exhibits normal muscle tone. Coordination normal.   5/5 strength throughout. CN 2-12 intact.Equal grip strength.   Skin: Skin is warm.  Psychiatric: She has a normal mood and affect. Her behavior is normal.  Nursing note and vitals reviewed.   ED Course  Procedures (including critical care time) DIAGNOSTIC STUDIES: Oxygen Saturation is 100% on RA, normal by my interpretation.    COORDINATION OF CARE: 12:13 AM- Pt advised of plan for treatment and pt agrees. Pt will receive lab work for further evaluation.    Labs Review Labs Reviewed  URINALYSIS, ROUTINE W REFLEX MICROSCOPIC (NOT AT [facility]) - Abnormal; Notable for the following:    Color, Urine STRAW (*)    Hgb urine dipstick TRACE (*)    Leukocytes, UA SMALL (*)    All other components within normal limits  CBC WITH DIFFERENTIAL/PLATELET - Abnormal; Notable  for the following:    RBC 3.62 (*)    Hemoglobin 11.2 (*)    HCT 34.9 (*)    Platelets 437 (*)    All other components within normal limits  COMPREHENSIVE METABOLIC PANEL - Abnormal; Notable for the following:    Glucose, Bld 100 (*)    ALT 11 (*)    GFR calc non Af Amer 59 (*)    Anion gap 3 (*)    All other components within normal limits  URINE MICROSCOPIC-ADD ON - Abnormal; Notable for the following:    Squamous Epithelial / LPF 0-5 (*)    Bacteria, UA FEW (*)    All other components within normal limits  POC OCCULT BLOOD, ED - Abnormal; Notable for the following:    Fecal Occult Bld POSITIVE (*)    All other components within normal limits  LIPASE, BLOOD    Imaging Review Ct Abdomen Pelvis Wo Contrast  07/06/2015  CLINICAL DATA:  Acute onset of diarrhea and lightheadedness. Initial encounter. EXAM: CT ABDOMEN AND PELVIS WITHOUT CONTRAST TECHNIQUE: Multidetector CT imaging of the abdomen and pelvis was performed following the standard protocol without IV contrast. COMPARISON:  CT of the abdomen and pelvis from 12/16/2014 FINDINGS: The visualized lung bases are clear. Mild coronary artery calcifications are seen. The liver and spleen are unremarkable in appearance. The gallbladder is within normal limits. The pancreas and adrenal glands are unremarkable. A 2.2 cm cyst is noted at the right kidney. A 3 mm stone is noted at the lower pole of the right kidney. A tiny angiomyolipoma is noted at the upper pole of the left kidney. The kidneys are otherwise unremarkable. There is no evidence of hydronephrosis. No obstructing ureteral stones are seen. No perinephric stranding is appreciated. No free fluid is identified. The small bowel is unremarkable in appearance. The stomach is within normal limits. No acute vascular abnormalities are seen. Scattered calcification is noted along the abdominal aorta and its branches. The appendix is not definitely characterized; there is no evidence of  appendicitis. The colon is largely filled with stool. Scattered diverticulosis is noted along  the sigmoid colon, without evidence of diverticulitis. The bladder is moderately distended and grossly unremarkable. The patient is status post hysterectomy. No suspicious adnexal masses are seen. No inguinal lymphadenopathy is seen. No acute osseous abnormalities are identified. IMPRESSION: 1. No acute abnormality seen to explain the patient's symptoms. 2. Scattered diverticulosis along the sigmoid colon, without evidence of diverticulitis. 3. Right renal cyst. 3 mm nonobstructing stone at the lower pole of the right kidney. Tiny angiomyolipoma at the upper pole of the left kidney. 4. Mild coronary artery calcifications seen. 5. Scattered calcification along the abdominal aorta and its branches. Electronically Signed   By: Garald Balding M.D.   On: 07/06/2015 02:01     Ezequiel Essex, MD has personally reviewed and evaluated these images and lab results as part of his medical decision-making.   EKG Interpretation None      MDM   Final diagnoses:  Diarrhea, unspecified type   Ongoing diarrhea since yesterday. Some blood with wiping. No nausea or vomiting or fever. No sick contacts, recent travel or antibiotic use.  Abdomen is soft. Hemoccult is positive but no gross blood.  No diarrhea during stay in the ED. Unable to give stool sample.  Labs appear to be at baseline. CT scan shows no acute pathology. There is scattered diverticulosis. Patient does not appear to have ongoing GI bleed. Hemoglobin is stable. Vital signs are stable. She is not anticoagulated.  Tolerating PO in the ED. No diarrhea in the ED.  She is anxious to go home. Continue supportive care, oral hydration at home, follow up with PCP. Return precautions discussed.   I personally performed the services described in this documentation, which was scribed in my presence. The recorded information has been reviewed and is  accurate.   Ezequiel Essex, MD 07/06/15 [phone]

## 2015-07-06 NOTE — Discharge Instructions (Signed)
Diarrhea Your testing is reassuring. Keep yourself hydrated. Follow up with your doctor. Return to the ED if you develop new or worsening symptoms. Diarrhea is frequent loose and watery bowel movements. It can cause you to feel weak and dehydrated. Dehydration can cause you to become tired and thirsty, have a dry mouth, and have decreased urination that often is dark yellow. Diarrhea is a sign of another problem, most often an infection that will not last long. In most cases, diarrhea typically lasts 2-3 days. However, it can last longer if it is a sign of something more serious. It is important to treat your diarrhea as directed by your caregiver to lessen or prevent future episodes of diarrhea. CAUSES  Some common causes include:  Gastrointestinal infections caused by viruses, bacteria, or parasites.  Food poisoning or food allergies.  Certain medicines, such as antibiotics, chemotherapy, and laxatives.  Artificial sweeteners and fructose.  Digestive disorders. HOME CARE INSTRUCTIONS  Ensure adequate fluid intake (hydration): Have 1 cup (8 oz) of fluid for each diarrhea episode. Avoid fluids that contain simple sugars or sports drinks, fruit juices, whole milk products, and sodas. Your urine should be clear or pale yellow if you are drinking enough fluids. Hydrate with an oral rehydration solution that you can purchase at pharmacies, retail stores, and online. You can prepare an oral rehydration solution at home by mixing the following ingredients together:   - tsp table salt.   tsp baking soda.   tsp salt substitute containing potassium chloride.  1  tablespoons sugar.  1 L (34 oz) of water.  Certain foods and beverages may increase the speed at which food moves through the gastrointestinal (GI) tract. These foods and beverages should be avoided and include:  Caffeinated and alcoholic beverages.  High-fiber foods, such as raw fruits and vegetables, nuts, seeds, and whole grain  breads and cereals.  Foods and beverages sweetened with sugar alcohols, such as xylitol, sorbitol, and mannitol.  Some foods may be well tolerated and may help thicken stool including:  Starchy foods, such as rice, toast, pasta, low-sugar cereal, oatmeal, grits, baked potatoes, crackers, and bagels.  Bananas.  Applesauce.  Add probiotic-rich foods to help increase healthy bacteria in the GI tract, such as yogurt and fermented milk products.  Wash your hands well after each diarrhea episode.  Only take over-the-counter or prescription medicines as directed by your caregiver.  Take a warm bath to relieve any burning or pain from frequent diarrhea episodes. SEEK IMMEDIATE MEDICAL CARE IF:   You are unable to keep fluids down.  You have persistent vomiting.  You have blood in your stool, or your stools are black and tarry.  You do not urinate in 6-8 hours, or there is only a small amount of very dark urine.  You have abdominal pain that increases or localizes.  You have weakness, dizziness, confusion, or light-headedness.  You have a severe headache.  Your diarrhea gets worse or does not get better.  You have a fever or persistent symptoms for more than 2-3 days.  You have a fever and your symptoms suddenly get worse. MAKE SURE YOU:   Understand these instructions.  Will watch your condition.  Will get help right away if you are not doing well or get worse.   This information is not intended to replace advice given to you by your health care provider. Make sure you discuss any questions you have with your health care provider.   Document Released: 01/13/2002 Document Revised:  02/13/2014 Document Reviewed: 10/01/2011 Elsevier Interactive Patient Education [employer].

## 2015-07-06 NOTE — ED Notes (Signed)
Pt unable to provide stool sample at this time

## 2015-07-06 NOTE — ED Notes (Signed)
Pt tolerating PO fluids at this time.

## 2015-07-09 ENCOUNTER — Ambulatory Visit (INDEPENDENT_AMBULATORY_CARE_PROVIDER_SITE_OTHER): Payer: Medicare Other | Admitting: Family

## 2015-07-09 ENCOUNTER — Encounter: Payer: Self-pay | Admitting: Family

## 2015-07-09 ENCOUNTER — Telehealth: Payer: Self-pay | Admitting: Family Medicine

## 2015-07-09 VITALS — BP 100/57 | HR 69 | Temp 96.5°F | Ht 63.0 in | Wt 163.6 lb

## 2015-07-09 DIAGNOSIS — R197 Diarrhea, unspecified: Secondary | ICD-10-CM

## 2015-07-09 MED ORDER — RIFAXIMIN 550 MG PO TABS: 550.0000 mg | ORAL_TABLET | Freq: Three times a day (TID) | ORAL | Status: DC

## 2015-07-09 MED ORDER — RIFAXIMIN 550 MG PO TABS
550.0000 mg | ORAL_TABLET | Freq: Three times a day (TID) | ORAL | Status: DC
Start: 2015-07-09 — End: 2015-07-09

## 2015-07-09 MED ORDER — HYDROCORTISONE 2.5 % RE CREA: TOPICAL_CREAM | RECTAL | Status: DC

## 2015-07-09 NOTE — Addendum Note (Signed)
Addended by: Evelina Dun A on: 07/09/2015 04:14 PM   Modules accepted: Orders

## 2015-07-09 NOTE — Telephone Encounter (Signed)
Patient informed, she is being seen today at 3:40

## 2015-07-09 NOTE — Telephone Encounter (Signed)
Patient complains of diarrhea for the last 5 days.  She has taken Immodium and Loperamide and reports neither have helped.  Please advise.

## 2015-07-09 NOTE — Telephone Encounter (Signed)
PT needs to be seen

## 2015-07-09 NOTE — Progress Notes (Signed)
   Subjective:    Patient ID: Amy Black, female    DOB: 10-24-1940, 75 y.o.   MRN: [id number]  Pt presents to the office today with recurrent diarrhea. Pt states she has transverse myelitis that can cause her diarrhea. Pt states she has taken imodium and lomotil without relief. PT went to the ED on 07/05/15 with this diarrhea and had a negative CT and was given hydrocortisone rectal cream.  Diarrhea  This is a new problem. The current episode started in the past 7 days. The problem occurs 5 to 10 times per day. The problem has been unchanged. The patient states that diarrhea awakens her from sleep. Pertinent negatives include no bloating, chills, fever, increased  flatus, myalgias or vomiting. Nothing aggravates the symptoms. Risk factors include recent antibiotic use and recent hospitalization. She has tried anti-motility drug for the symptoms. The treatment provided no relief.   *Hopital notes were reviewed.  Review of Systems  Constitutional: Negative for fever and chills.  Gastrointestinal: Positive for diarrhea. Negative for vomiting, bloating and flatus.  Musculoskeletal: Negative for myalgias.  All other systems reviewed and are negative.      Objective:   Physical Exam  Constitutional: She is oriented to person, place, and time. She appears well-developed and well-nourished. No distress.  HENT:  Head: Normocephalic and atraumatic.  Right Ear: External ear normal.  Mouth/Throat: Oropharynx is clear and moist.  Eyes: Pupils are equal, round, and reactive to light.  Cardiovascular: Normal rate, regular rhythm, normal heart sounds and intact distal pulses.   No murmur heard. Pulmonary/Chest: Effort normal and breath sounds normal. No respiratory distress. She has no wheezes.  Abdominal: Soft. Bowel sounds are normal. She exhibits no distension. There is no tenderness.  Musculoskeletal: Normal range of motion. She exhibits edema (trace in RLE). She exhibits no tenderness.    Neurological: She is alert and oriented to person, place, and time.  Skin: Skin is warm and dry.  Psychiatric: She has a normal mood and affect. Her behavior is normal. Judgment and thought content normal.  Vitals reviewed.   BP 100/57 mmHg  Pulse 69  Temp(Src) 96.5 F (35.8 C) (Oral)  Ht 5\' 3"  (1.6 m)  Wt 163 lb 9.6 oz (74.208 kg)  BMI 28.99 kg/m2       Assessment & Plan:  1. Diarrhea, unspecified type -Do not start xifaxan until stool samples are collected -Force fluids -Bland diet -Add probiotic  -RTO in 1 week if not improved - Cdiff NAA+O+P+Stool Culture - rifaximin (XIFAXAN) 550 MG TABS tablet; Take 1 tablet (550 mg total) by mouth 3 (three) times daily.  Dispense: 42 tablet; Refill: 0 - hydrocortisone (ANUSOL-HC) 2.5 % rectal cream; Apply rectally 2 times daily as needed  Dispense: 28.35 g; Refill: 0

## 2015-07-09 NOTE — Patient Instructions (Signed)

## 2015-07-10 ENCOUNTER — Other Ambulatory Visit: Payer: Medicare Other

## 2015-07-12 DIAGNOSIS — R197 Diarrhea, unspecified: Secondary | ICD-10-CM | POA: Diagnosis not present

## 2015-07-13 ENCOUNTER — Telehealth: Payer: Self-pay | Admitting: Family Medicine

## 2015-07-13 ENCOUNTER — Telehealth: Payer: Self-pay

## 2015-07-13 NOTE — Telephone Encounter (Signed)
Pt states this is the 8th day & hopes this is the last day. She has tried both imodium & lomotil without any help. Will let us know how this helps. Labs are not back yet.

## 2015-07-13 NOTE — Telephone Encounter (Signed)
Is patient still having diarrhea? If imodium and lomotil  not working? We do not carry samples of this medications

## 2015-07-13 NOTE — Telephone Encounter (Signed)
Insurance authorized [employer]

## 2015-07-18 LAB — CDIFF NAA+O+P+STOOL CULTURE
E coli, Shiga toxin Assay: NEGATIVE
Toxigenic C. Difficile by PCR: NEGATIVE

## 2015-07-19 ENCOUNTER — Other Ambulatory Visit: Payer: Medicare Other

## 2015-07-19 ENCOUNTER — Ambulatory Visit: Payer: Medicare Other | Admitting: Family

## 2015-07-20 DIAGNOSIS — M5136 Other intervertebral disc degeneration, lumbar region: Secondary | ICD-10-CM | POA: Diagnosis not present

## 2015-07-23 ENCOUNTER — Encounter: Payer: Self-pay | Admitting: Family

## 2015-07-23 ENCOUNTER — Ambulatory Visit (HOSPITAL_BASED_OUTPATIENT_CLINIC_OR_DEPARTMENT_OTHER): Payer: Medicare Other | Admitting: Family

## 2015-07-23 ENCOUNTER — Other Ambulatory Visit (HOSPITAL_BASED_OUTPATIENT_CLINIC_OR_DEPARTMENT_OTHER): Payer: Medicare Other

## 2015-07-23 VITALS — BP 132/68 | HR 55 | Temp 97.8°F | Resp 16 | Ht 63.0 in | Wt 159.0 lb

## 2015-07-23 DIAGNOSIS — Z859 Personal history of malignant neoplasm, unspecified: Secondary | ICD-10-CM

## 2015-07-23 DIAGNOSIS — D75839 Thrombocytosis, unspecified: Secondary | ICD-10-CM

## 2015-07-23 DIAGNOSIS — Z862 Personal history of diseases of the blood and blood-forming organs and certain disorders involving the immune mechanism: Secondary | ICD-10-CM | POA: Diagnosis not present

## 2015-07-23 DIAGNOSIS — C801 Malignant (primary) neoplasm, unspecified: Secondary | ICD-10-CM

## 2015-07-23 DIAGNOSIS — D473 Essential (hemorrhagic) thrombocythemia: Secondary | ICD-10-CM

## 2015-07-23 DIAGNOSIS — G373 Acute transverse myelitis in demyelinating disease of central nervous system: Secondary | ICD-10-CM

## 2015-07-23 LAB — COMPREHENSIVE METABOLIC PANEL WITH GFR
ALT: 25 U/L (ref 0–55)
AST: 29 U/L (ref 5–34)
Albumin: 3.8 g/dL (ref 3.5–5.0)
Alkaline Phosphatase: 86 U/L (ref 40–150)
Anion Gap: 7 meq/L (ref 3–11)
BUN: 13.8 mg/dL (ref 7.0–26.0)
CO2: 27 meq/L (ref 22–29)
Calcium: 9.6 mg/dL (ref 8.4–10.4)
Chloride: 102 meq/L (ref 98–109)
Creatinine: 1.1 mg/dL (ref 0.6–1.1)
EGFR: 61 ml/min/1.73 m2 — ABNORMAL LOW (ref >90)
Glucose: 93 mg/dL (ref 70–140)
Potassium: 3.8 meq/L (ref 3.5–5.1)
Sodium: 136 meq/L (ref 136–145)
Total Bilirubin: <0.3 mg/dL (ref 0.20–1.20)
Total Protein: 7.3 g/dL (ref 6.4–8.3)

## 2015-07-23 LAB — CBC WITH DIFFERENTIAL (CANCER CENTER ONLY)
BASO#: 0 10e3/uL (ref 0.0–0.2)
BASO%: 0.4 % (ref 0.0–2.0)
EOS%: 0.4 % (ref 0.0–7.0)
Eosinophils Absolute: 0 10e3/uL (ref 0.0–0.5)
HCT: 37.9 % (ref 34.8–46.6)
HGB: 12.5 g/dL (ref 11.6–15.9)
LYMPH#: 4.5 10e3/uL — ABNORMAL HIGH (ref 0.9–3.3)
LYMPH%: 55.6 % — ABNORMAL HIGH (ref 14.0–48.0)
MCH: 31.6 pg (ref 26.0–34.0)
MCHC: 33 g/dL (ref 32.0–36.0)
MCV: 96 fL (ref 81–101)
MONO#: 0.7 10e3/uL (ref 0.1–0.9)
MONO%: 9.1 % (ref 0.0–13.0)
NEUT#: 2.8 10e3/uL (ref 1.5–6.5)
NEUT%: 34.5 % — ABNORMAL LOW (ref 39.6–80.0)
Platelets: 429 10e3/uL — ABNORMAL HIGH (ref 145–400)
RBC: 3.96 10e6/uL (ref 3.70–5.32)
RDW: 13.3 % (ref 11.1–15.7)
WBC: 8.2 10e3/uL (ref 3.9–10.0)

## 2015-07-23 NOTE — Progress Notes (Signed)
Hematology and Oncology Follow Up Visit  Amy Rothman Bias [id number] 1940-09-16 75 y.o. 07/23/2015   Principle Diagnosis:  Transient thrombocytosis History transverse myelitis History of adenosquamous carcinoma of unknown primary  Current Therapy:   Observation    Interim History:  Amy Black is here today for a follow-up. She continues to do well. She has chronic problems with lower back and right knee pain. She is now receiving steroid injections in her back and states that she can tell there is improvement in her pain. No swelling, numbness or tingling in her extremities.  She has had a dry cough and sinus congestion and is taking Zyrtec daily.  No fever, chills, n/v, rash, dizziness, SOB, palpitations, abdominal pain or changes in bowel or bladder habits. She is still having diarrhea and is hoping GI can get the price of Xifaxan down for her. She has tried taking both imodium and lomotil with no relief.  She had an episode of angina yesterday which resolved when she took 1 nitro tab. She has had no other episodes but will contact cardiology if it happens again. She is taking 1 baby aspirin daily.   No lymphadenopathy found on exam. No episodes of bleeding or bruising. Platelet count is again slightly elevated at 429. No anemia.  She is eating well and staying hydrated. Her weight is stable.   Medications:    Medication List       This list is accurate as of: 07/23/15  2:36 PM.  Always use your most recent med list.               ALPRAZolam 0.5 MG tablet  Commonly known as:  XANAX  TAKE ONE TABLET BY MOUTH THREE TIMES DAILY     aspirin 81 MG tablet  Take 1 tablet (81 mg total) by mouth daily.     cetirizine 10 MG tablet  Commonly known as:  ZYRTEC  Take 1 tablet (10 mg total) by mouth 2 (two) times daily.     diphenoxylate-atropine 2.5-0.025 MG tablet  Commonly known as:  LOMOTIL  TAKE ONE TABLET BY MOUTH FOUR TIMES DAILY AS NEEDED FOR DIARRHEA FOR LOOSE STOOLS.     fluticasone 50 MCG/ACT nasal spray  Commonly known as:  FLONASE  Place 2 sprays into both nostrils daily.     hydrocortisone 2.5 % rectal cream  Commonly known as:  ANUSOL-HC  Apply rectally 2 times daily as needed     isosorbide mononitrate 30 MG 24 hr tablet  Commonly known as:  IMDUR  Take 0.5 tablets (15 mg total) by mouth daily.     NITROSTAT 0.4 MG SL tablet  Generic drug:  nitroGLYCERIN  DISSOLVE ONE TABLET UNDER TONGUE EVERY 5 MINUTES UP TO 3 DOSES AS NEEDED FOR CHEST PAIN     rifaximin 550 MG Tabs tablet  Commonly known as:  XIFAXAN  Take 1 tablet (550 mg total) by mouth 3 (three) times daily.     traMADol 50 MG tablet  Commonly known as:  ULTRAM  TAKE ONE TABLET BY MOUTH EVERY TWELVE HOURS AS NEEDED.     triamterene-hydrochlorothiazide 37.5-25 MG capsule  Commonly known as:  DYAZIDE  Take 1 each (1 capsule total) by mouth daily.     verapamil 80 MG tablet  Commonly known as:  CALAN  TAKE ONE TABLET BY MOUTH FOUR TIMES DAILY     Vitamin D (Ergocalciferol) 50000 units Caps capsule  Commonly known as:  DRISDOL  TAKE ONE CAPSULE BY MOUTH EVERY  7 DAYS     VITAMIN D PO  Take 1 capsule by mouth daily. OTC        Allergies:  Allergies  Allergen Reactions  . Cymbalta [Duloxetine Hcl] Other (See Comments)    sleepiness  . Gabapentin Other (See Comments)    sleepiness  . Ivp Dye [Iodinated Diagnostic Agents] Itching  . Zanaflex [Tizanidine Hcl] Other (See Comments)    Very drunk feeling next day    Past Medical History, Surgical history, Social history, and Family History were reviewed and updated.  Review of Systems: All other 10 point review of systems is negative.   Physical Exam:  height is 5\' 3"  (1.6 m) and weight is 159 lb (72.122 kg). Her oral temperature is 97.8 F (36.6 C). Her blood pressure is 132/68 and her pulse is 55. Her respiration is 16.   Wt Readings from Last 3 Encounters:  07/23/15 159 lb (72.122 kg)  07/09/15 163 lb 9.6 oz (74.208  kg)  07/05/15 157 lb (71.215 kg)    Ocular: Sclerae unicteric, pupils equal, round and reactive to light Ear-nose-throat: Oropharynx clear, dentition fair Lymphatic: No cervical supraclavicular or axillary adenopathy Lungs no rales or rhonchi, good excursion bilaterally Heart regular rate and rhythm, no murmur appreciated Abd soft, nontender, positive bowel sounds, no liver or spleen tip palpated on exam MSK no focal spinal tenderness, no joint edema Neuro: non-focal, well-oriented, appropriate affect Breasts: Deferred  Lab Results  Component Value Date   WBC 8.2 07/23/2015   HGB 12.5 07/23/2015   HCT 37.9 07/23/2015   MCV 96 07/23/2015   PLT 429 Platelet count consistent in citrate* 07/23/2015   Lab Results  Component Value Date   FERRITIN 82 01/18/2015   IRON 67 01/18/2015   TIBC 231* 01/18/2015   UIBC 164 01/18/2015   IRONPCTSAT 29 01/18/2015   Lab Results  Component Value Date   RETICCTPCT 1.6 09/10/2013   RBC 3.96 07/23/2015   No results found for: KPAFRELGTCHN, LAMBDASER, KAPLAMBRATIO No results found for: IGGSERUM, IGA, IGMSERUM No results found for: Odetta Pink, SPEI   Chemistry      Component Value Date/Time   NA 136 07/06/2015 0020   NA 139 06/26/2015 0821   NA 140 01/18/2015 1115   NA 140 07/20/2014 1131   K 4.7 07/06/2015 0020   K 4.1 01/18/2015 1115   K 4.6 07/20/2014 1131   CL 103 07/06/2015 0020   CL 101 07/20/2014 1131   CO2 30 07/06/2015 0020   CO2 27 01/18/2015 1115   CO2 32 07/20/2014 1131   BUN 13 07/06/2015 0020   BUN 12 06/26/2015 0821   BUN 14.6 01/18/2015 1115   BUN 10 07/20/2014 1131   CREATININE 0.94 07/06/2015 0020   CREATININE 0.8 01/18/2015 1115   CREATININE 0.9 07/20/2014 1131      Component Value Date/Time   CALCIUM 9.4 07/06/2015 0020   CALCIUM 9.4 01/18/2015 1115   CALCIUM 9.8 07/20/2014 1131   ALKPHOS 81 07/06/2015 0020   ALKPHOS 101 01/18/2015 1115   ALKPHOS 97*  07/20/2014 1131   AST 17 07/06/2015 0020   AST 12 01/18/2015 1115   AST 23 07/20/2014 1131   ALT 11* 07/06/2015 0020   ALT <9 01/18/2015 1115   ALT 19 07/20/2014 1131   BILITOT 0.3 07/06/2015 0020   BILITOT 0.4 06/26/2015 0821   BILITOT 0.47 01/18/2015 1115   BILITOT 0.50 07/20/2014 1131     Impression and Plan: Ms.  Black is a 75 year old African American female with history of transient thrombocytosis and also history of adenosquamous carcinoma with unknown primary. So far, there is no evidence of recurrence. She continues to do well and is asymptomatic at this time.  She is followed closely by GI and cardiology. She will contact Dr. Percival Spanish if she has a recurrent episode of CP.  Her platelet count is 429, no episodes of bleeding or bruising. No anemia or leukopenia at this time.  We will plan to see her back in 6 months for labs and follow-up.  She will contact our office with any questions or concerns. We can certainly see her sooner if need be.   Eliezer Bottom, NP 6/16/20172:36 PM

## 2015-07-26 ENCOUNTER — Telehealth: Payer: Self-pay | Admitting: Family

## 2015-07-26 ENCOUNTER — Telehealth: Payer: Self-pay | Admitting: Gastroenterology

## 2015-07-26 DIAGNOSIS — H20011 Primary iridocyclitis, right eye: Secondary | ICD-10-CM | POA: Diagnosis not present

## 2015-07-26 DIAGNOSIS — K529 Noninfective gastroenteritis and colitis, unspecified: Secondary | ICD-10-CM

## 2015-07-26 LAB — IRON AND TIBC
%SAT: 20 % — ABNORMAL LOW (ref 21–57)
Iron: 50 ug/dL (ref 41–142)
TIBC: 248 ug/dL (ref 236–444)
UIBC: 198 ug/dL (ref 120–384)

## 2015-07-26 LAB — FERRITIN: Ferritin: 83 ng/ml (ref 9–269)

## 2015-07-26 NOTE — Telephone Encounter (Signed)
Any way that this can be scheduled sooner?

## 2015-07-26 NOTE — Telephone Encounter (Signed)
The pt was advised to try imodium and lomotil, she already has this on hand from previous appt with Dr Sharlett Iles.  She will keep appt as scheduled with Dr Loletha Carrow on 08/26/15 and also has a follow up with her PCP .  She states the diarrhea is the same as she has had for years and wants to know what is causing it.. Pt was advised again to be sure and keep appt as scheduled with Dr Loletha Carrow to discuss. Pt agreed

## 2015-07-26 NOTE — Telephone Encounter (Signed)
Pt said she is having uncontrollable diarrhea and wants to know what to do

## 2015-07-26 NOTE — Telephone Encounter (Signed)
Will do referral and see when we can get it!

## 2015-07-28 ENCOUNTER — Telehealth: Payer: Self-pay | Admitting: Family Medicine

## 2015-07-30 ENCOUNTER — Encounter: Payer: Self-pay | Admitting: Gastroenterology

## 2015-07-30 ENCOUNTER — Telehealth: Payer: Self-pay | Admitting: Gastroenterology

## 2015-07-30 ENCOUNTER — Ambulatory Visit (INDEPENDENT_AMBULATORY_CARE_PROVIDER_SITE_OTHER): Payer: Medicare Other | Admitting: Gastroenterology

## 2015-07-30 VITALS — BP 124/70 | HR 68 | Ht 63.0 in | Wt 158.0 lb

## 2015-07-30 DIAGNOSIS — R195 Other fecal abnormalities: Secondary | ICD-10-CM | POA: Diagnosis not present

## 2015-07-30 DIAGNOSIS — R197 Diarrhea, unspecified: Secondary | ICD-10-CM | POA: Diagnosis not present

## 2015-07-30 MED ORDER — NA SULFATE-K SULFATE-MG SULF 17.5-3.13-1.6 GM/177ML PO SOLN: 1.0000 | Freq: Once | ORAL | Status: DC

## 2015-07-30 NOTE — Patient Instructions (Signed)
If you are age 73 or older, your body mass index should be between 23-30. Your Body mass index is 28 kg/(m^2). If this is out of the aforementioned range listed, please consider follow up with your Primary Care Provider.  If you are age 70 or younger, your body mass index should be between 19-25. Your Body mass index is 28 kg/(m^2). If this is out of the aformentioned range listed, please consider follow up with your Primary Care Provider.   You have been scheduled for a colonoscopy. Please follow written instructions given to you at your visit today.  Please pick up your prep supplies at the pharmacy within the next 1-3 days. If you use inhalers (even only as needed), please bring them with you on the day of your procedure. Your physician has requested that you go to www.startemmi.com and enter the access code given to you at your visit today. This web site gives a general overview about your procedure. However, you should still follow specific instructions given to you by our office regarding your preparation for the procedure.  Thank you for choosing [facility]  Dr Wilfrid Lund III

## 2015-07-30 NOTE — Progress Notes (Signed)
[facility] Consult Note:  History: Amy Black 07/30/2015  Referring physician: [name], MD  Reason for consult/chief complaint: Diarrhea   Subjective HPI:  Last seen 7/12 for Hx diarrhea/IBS and bacterial overgrowth (no details).  She recalls getting rifaximin at some point yrs ago, thinks it helped for some time. Last colon 07/2005 - Bx neg for micro colitis Here for recent worsening of symptoms.  Tangential historian, pressured speech.In early June had 8 days of worsened diarrhea with urgency and "mushy " stool". Says lomotil and imodium "did not touch it".  Resolved, then came back for 2 days 2 weeks ago.  Well since then. Was in the Ed for this 5/30.  CBC nml, ED doc  Note indicated heme positive stool, no gross blood.  Had Abx in April, but C dif neg on 07/12/15 No travel, sick contacts or travel.  Denies abdominal pain. ROS:  Review of Systems  Constitutional: Negative for appetite change and unexpected weight change.  HENT: Negative for mouth sores and voice change.   Eyes: Negative for pain and redness.  Respiratory: Negative for cough and shortness of breath.   Cardiovascular: Negative for chest pain and palpitations.  Genitourinary: Negative for dysuria and hematuria.  Musculoskeletal: Negative for myalgias and arthralgias.  Skin: Negative for pallor and rash.  Neurological: Positive for weakness. Negative for headaches.  Hematological: Negative for adenopathy.  Psychiatric/Behavioral: The patient is nervous/anxious.    Reports intermittent lower ext tingling from transverse myelitis.  She feels this condition also might contribute to the diarrhea  Past Medical History: Past Medical History  Diagnosis Date  . CAD (coronary artery disease)   . Irritable bowel syndrome   . Diverticulosis of colon (without mention of hemorrhage)   . hepatic cyst   . Hyperlipidemia   . Adenosquamous carcinoma     left leg 2004 - radiation & resection  . HTN  (hypertension)     PT. DENIES  . History of cardiac catheterization   . Unstable angina ([place])   . Adenocarcinoma (H[facility])     LEFT LEG  . Insomnia   . Vitamin D deficiency   . Adenocarcinoma of unknown primary ([place]) 02/06/2011  . History of nuclear stress test 04/2011    lexiscan; normal study, no significant ischemia, low risk   . Coronary artery spasm ([place])   . Transverse myelitis [facility])      Past Surgical History: Past Surgical History  Procedure Laterality Date  . Resection soft tissue tumor leg / ankle radical  2004    leg lesion resection & radiation  . Diagnostic laparoscopy    . Lysis of adhesion    . Salpingoophorectomy Left   . Abdominal hysterectomy    . Pelvic laparoscopy  1992    RSO, AND LSO ON 2007  . Cardiac catheterization      coronary spasm  . Transthoracic echocardiogram  07/2012    LV cavity size mildly reduced, normal wall motion; MV with calcified annulus and mild MR; LA mildly dilated; atrial septum with increased thickness - lipomatous hypertrophy; RV systolic pressure increased (borderline pulm HTN)  . Cardiac catheterization N/A 08/28/2014    Procedure: Left Heart Cath and Coronary Angiography;  Surgeon: Jettie Booze, MD;  Location: [facility];  Service: Cardiovascular;  Laterality: N/A;     Family History: Family History  Problem Relation Age of Onset  . Diabetes      Multiple family members on both sides   . Coronary artery disease  Multiple family members on both sides   . Bladder Cancer Mother   . Heart disease Mother   . Hypertension Mother   . Colon cancer Neg Hx   . Heart disease Father   . Stroke Father     Social History: Social History   Social History  . Marital Status: Married    Spouse Name: N/A  . Number of Children: 2  . Years of Education: N/A   Occupational History  . Disabilty   . DISABILITY    Social History Main Topics  . Smoking status: Former Smoker -- 0.50 packs/day for 4 years    Types:  Cigarettes    Start date: 12/07/1968    Quit date: 11/20/1972  . Smokeless tobacco: Never Used     Comment: quit 40 years ago  . Alcohol Use: No  . Drug Use: No  . Sexual Activity: No   Other Topics Concern  . None   Social History Narrative    Allergies: Allergies  Allergen Reactions  . Cymbalta [Duloxetine Hcl] Other (See Comments)    sleepiness  . Gabapentin Other (See Comments)    sleepiness  . Ivp Dye [Iodinated Diagnostic Agents] Itching  . Zanaflex [Tizanidine Hcl] Other (See Comments)    Very drunk feeling next day    Outpatient Meds: Current Outpatient Prescriptions  Medication Sig Dispense Refill  . ALPRAZolam (XANAX) 0.5 MG tablet TAKE ONE TABLET BY MOUTH THREE TIMES DAILY 90 tablet 2  . aspirin 81 MG tablet Take 1 tablet (81 mg total) by mouth daily. 30 tablet   . cetirizine (ZYRTEC) 10 MG tablet Take 1 tablet (10 mg total) by mouth 2 (two) times daily. (Patient taking differently: Take 10 mg by mouth as needed. ) 30 tablet 0  . Cholecalciferol (VITAMIN D PO) Take 1 capsule by mouth daily. OTC    . diphenoxylate-atropine (LOMOTIL) 2.5-0.025 MG tablet TAKE ONE TABLET BY MOUTH FOUR TIMES DAILY AS NEEDED FOR DIARRHEA FOR LOOSE STOOLS. 30 tablet 2  . fluticasone (FLONASE) 50 MCG/ACT nasal spray Place 2 sprays into both nostrils daily. 16 g 6  . hydrocortisone (ANUSOL-HC) 2.5 % rectal cream Apply rectally 2 times daily as needed 28.35 g 0  . isosorbide mononitrate (IMDUR) 30 MG 24 hr tablet Take 0.5 tablets (15 mg total) by mouth daily. 30 tablet 11  . NITROSTAT 0.4 MG SL tablet DISSOLVE ONE TABLET UNDER TONGUE EVERY 5 MINUTES UP TO 3 DOSES AS NEEDED FOR CHEST PAIN 25 tablet 1  . traMADol (ULTRAM) 50 MG tablet TAKE ONE TABLET BY MOUTH EVERY TWELVE HOURS AS NEEDED. 60 tablet 5  . triamterene-hydrochlorothiazide (DYAZIDE) 37.5-25 MG capsule Take 1 each (1 capsule total) by mouth daily.    . verapamil (CALAN) 80 MG tablet TAKE ONE TABLET BY MOUTH FOUR TIMES DAILY 115  tablet 12  . Vitamin D, Ergocalciferol, (DRISDOL) 50000 units CAPS capsule TAKE ONE CAPSULE BY MOUTH EVERY 7 DAYS 18 capsule 0  . Na Sulfate-K Sulfate-Mg Sulf 17.5-3.13-1.6 GM/180ML SOLN Take 1 kit by mouth once. 354 mL 0   No current facility-administered medications for this visit.      ___________________________________________________________________ Objective  Exam:  BP 124/70 mmHg  Pulse 68  Ht '5\' 3"'$  (1.6 m)  Wt 158 lb (71.668 kg)  BMI 28.00 kg/m2   General: this is a(n) well-appearing elderly woman   Eyes: sclera anicteric, no redness  ENT: oral mucosa moist without lesions, no cervical or supraclavicular lymphadenopathy, good dentition  CV: RRR without murmur,  S1/S2, no JVD, no peripheral edema  Resp: clear to auscultation bilaterally, normal RR and effort noted  GI: soft, no tenderness, with active bowel sounds. No guarding or palpable organomegaly noted.  Skin; warm and dry, no rash or jaundice noted  Neuro: awake, alert and oriented x 3. Normal gross motor function and fluent speech  Labs:  CBC Recent cbc normal   Assessment: Encounter Diagnoses  Name Primary?  . Diarrhea, unspecified type Yes  . Heme positive stool     Recent symptoms difficult to .  C diff neg.  No pain, no overt bleeding, appetitie good, weight stable. Sounds most like IBS.  Sounds like SIBO rx in past was empirical, unclear how much benefit she had - limited historian. Last colonoscopy 07/2005 and Bx neg for micro colitis  Plan:  Colonoscopy.  The benefits and risks of the planned procedure were described in detail with the patient or (when appropriate) their health care proxy.  Risks were outlined as including, but not limited to, bleeding, infection, perforation, adverse medication reaction leading to cardiac or pulmonary decompensation, or pancreatitis (if ERCP).  The limitation of incomplete mucosal visualization was also discussed.  No guarantees or warranties were  given.  Kept asking for samples of "something else for diarrhea". Explained need Dx first.  Will revisit after colonoscopy.  Thank you for the courtesy of this consult.  Please call me with any questions or concerns.  Nelida Meuse III  CCClaretta Fraise, MD

## 2015-08-02 NOTE — Telephone Encounter (Signed)
Pt contacted. Informed she will be put on our wait list for a free sample prep kit.

## 2015-08-03 ENCOUNTER — Encounter: Payer: Self-pay | Admitting: Gastroenterology

## 2015-08-09 ENCOUNTER — Other Ambulatory Visit: Payer: Self-pay | Admitting: Family Medicine

## 2015-08-09 NOTE — Telephone Encounter (Signed)
rx called into pharmacy

## 2015-08-09 NOTE — Telephone Encounter (Signed)
Last seen 07/09/15  Amy Black  If approved route to nurse to call into [facility]   [phone]

## 2015-08-11 ENCOUNTER — Telehealth: Payer: Self-pay

## 2015-08-11 ENCOUNTER — Other Ambulatory Visit: Payer: Self-pay

## 2015-08-11 NOTE — Telephone Encounter (Signed)
Per Dr Loletha Carrow have her do the Miralax split dose. Spoke to the patient and gave her new directions. She sates clear understanding about the prep changes.

## 2015-08-11 NOTE — Telephone Encounter (Signed)
miralax bowel prep.  Try to give instructions over phone.  If patient cannot manage that, cancel procedure for tomorrow and she will ned to travel here to get suprep sample

## 2015-08-11 NOTE — Telephone Encounter (Signed)
Called patient and told her I would put a suprep sample up front for her (colonoscopy is tomorrow) but she stated she cant come to the office to pick it up.  Amy Black is going to consult with Dr. Loletha Carrow and call her back.

## 2015-08-11 NOTE — Telephone Encounter (Signed)
See phone note dated 08-11-2015.

## 2015-08-11 NOTE — Telephone Encounter (Signed)
Pt states she cant come to [place] to pick up her free Suprep. Pt lives about an hour away. Please advise.

## 2015-08-12 ENCOUNTER — Ambulatory Visit (AMBULATORY_SURGERY_CENTER): Payer: Medicare Other | Admitting: Gastroenterology

## 2015-08-12 ENCOUNTER — Encounter: Payer: Self-pay | Admitting: Gastroenterology

## 2015-08-12 VITALS — BP 135/65 | HR 58 | Temp 97.8°F | Resp 14 | Ht 63.0 in | Wt 158.0 lb

## 2015-08-12 DIAGNOSIS — R197 Diarrhea, unspecified: Secondary | ICD-10-CM | POA: Diagnosis present

## 2015-08-12 DIAGNOSIS — R195 Other fecal abnormalities: Secondary | ICD-10-CM | POA: Diagnosis not present

## 2015-08-12 MED ORDER — SODIUM CHLORIDE 0.9 % IV SOLN: 500.0000 mL | INTRAVENOUS | Status: DC

## 2015-08-12 NOTE — Progress Notes (Signed)
Called to room to assist during endoscopic procedure.  Patient ID and intended procedure confirmed with present staff. Received instructions for my participation in the procedure from the performing physician.  

## 2015-08-12 NOTE — Patient Instructions (Signed)
YOU HAD AN ENDOSCOPIC PROCEDURE TODAY AT [facility]:   Refer to the procedure report that was given to you for any specific questions about what was found during the examination.  If the procedure report does not answer your questions, please call your gastroenterologist to clarify.  If you requested that your care partner not be given the details of your procedure findings, then the procedure report has been included in a sealed envelope for you to review at your convenience later.  YOU SHOULD EXPECT: Some feelings of bloating in the abdomen. Passage of more gas than usual.  Walking can help get rid of the air that was put into your GI tract during the procedure and reduce the bloating. If you had a lower endoscopy (such as a colonoscopy or flexible sigmoidoscopy) you may notice spotting of blood in your stool or on the toilet paper. If you underwent a bowel prep for your procedure, you may not have a normal bowel movement for a few days.  Please Note:  You might notice some irritation and congestion in your nose or some drainage.  This is from the oxygen used during your procedure.  There is no need for concern and it should clear up in a day or so.  SYMPTOMS TO REPORT IMMEDIATELY:   Following lower endoscopy (colonoscopy or flexible sigmoidoscopy):  Excessive amounts of blood in the stool  Significant tenderness or worsening of abdominal pains  Swelling of the abdomen that is new, acute  Fever of 100F or higher    For urgent or emergent issues, a gastroenterologist can be reached at any hour by calling [phone].   DIET: Your first meal following the procedure should be a small meal and then it is ok to progress to your normal diet. Heavy or fried foods are harder to digest and may make you feel nauseous or bloated.  Likewise, meals heavy in dairy and vegetables can increase bloating.  Drink plenty of fluids but you should avoid alcoholic beverages for 24  hours.  ACTIVITY:  You should plan to take it easy for the rest of today and you should NOT DRIVE or use heavy machinery until tomorrow (because of the sedation medicines used during the test).    FOLLOW UP: Our staff will call the number listed on your records the next business day following your procedure to check on you and address any questions or concerns that you may have regarding the information given to you following your procedure. If we do not reach you, we will leave a message.  However, if you are feeling well and you are not experiencing any problems, there is no need to return our call.  We will assume that you have returned to your regular daily activities without incident.  If any biopsies were taken you will be contacted by phone or by letter within the next 1-3 weeks.  Please call us at [phone] if you have not heard about the biopsies in 3 weeks.    SIGNATURES/CONFIDENTIALITY: You and/or your care partner have signed paperwork which will be entered into your electronic medical record.  These signatures attest to the fact that that the information above on your After Visit Summary has been reviewed and is understood.  Full responsibility of the confidentiality of this discharge information lies with you and/or your care-partner.   INFORMATION ON DIVERTICULOSIS AND HIGH FIBER DIET GIVEN TO YOU TODAY  AWAIT BIOPSY RESULTS

## 2015-08-12 NOTE — Op Note (Signed)
[facility] Patient Name: Amy Black Procedure Date: 08/12/2015 10:54 AM MRN: [id number] Endoscopist: Mallie Mussel L. Amy Black , MD Age: 75 Referring MD:  Date of Birth: May 29, 1940 Gender: Female Account #: [id number] Procedure:                Colonoscopy Indications:              Clinically significant diarrhea of unexplained                            origin, Heme positive stool Medicines:                Monitored Anesthesia Care Procedure:                Pre-Anesthesia Assessment:                           - Prior to the procedure, a History and Physical                            was performed, and patient medications and                            allergies were reviewed. The patient's tolerance of                            previous anesthesia was also reviewed. The risks                            and benefits of the procedure and the sedation                            options and risks were discussed with the patient.                            All questions were answered, and informed consent                            was obtained. Prior Anticoagulants: The patient has                            taken aspirin, last dose was 1 day prior to                            procedure. ASA Grade Assessment: III - A patient                            with severe systemic disease. After reviewing the                            risks and benefits, the patient was deemed in                            satisfactory condition to undergo the procedure.  After obtaining informed consent, the colonoscope                            was passed under direct vision. Throughout the                            procedure, the patient's blood pressure, pulse, and                            oxygen saturations were monitored continuously. The                            Model CF-HQ190L [id number]) scope was introduced                            through the anus and advanced to  the the cecum,                            identified by appendiceal orifice and ileocecal                            valve. The colonoscopy was performed without                            difficulty. The patient tolerated the procedure                            well. The quality of the bowel preparation was                            poor. The ileocecal valve, appendiceal orifice, and                            rectum were photographed. The bowel preparation                            used was Miralax. Scope In: 11:10:48 AM Scope Out: 11:22:28 AM Scope Withdrawal Time: 0 hours 8 minutes 34 seconds  Total Procedure Duration: 0 hours 11 minutes 40 seconds  Findings:                 The perianal and digital rectal examinations were                            normal.                           Multiple small-mouthed diverticula were found in                            the left colon.                           Retroflexion in the rectum was not performed due to  anatomy.                           The exam was otherwise without abnormality.                           Normal mucosa was found in the entire colon.                            Biopsies for histology were taken with a cold                            forceps from the right colon and left colon for                            evaluation of microscopic colitis. Complications:            No immediate complications. Estimated Blood Loss:     Estimated blood loss: none. Impression:               - Preparation of the colon was poor.                           - Diverticulosis in the left colon.                           - The examination was otherwise normal.                           - Normal mucosa in the entire examined colon.                            Biopsied. Recommendation:           - Patient has a contact number available for                            emergencies. The signs and symptoms of potential                             delayed complications were discussed with the                            patient. Return to normal activities tomorrow.                            Written discharge instructions were provided to the                            patient.                           - Resume previous diet.                           - Continue present medications.                           -  Await pathology results.                           - No recommendation at this time regarding repeat                            colonoscopy due to age and lack of findings today. Amy Black L. Amy Carrow, MD 08/12/2015 11:29:21 AM This report has been signed electronically.

## 2015-08-12 NOTE — Progress Notes (Signed)
Patient awakening,vss,report to rn 

## 2015-08-13 ENCOUNTER — Telehealth: Payer: Self-pay

## 2015-08-13 NOTE — Telephone Encounter (Signed)
  Follow up Call-  Call back number 08/12/2015 02/23/2015  Post procedure Call Back phone  # [phone] hm [phone]  Permission to leave phone message Yes -     Patient questions:  Do you have a fever, pain , or abdominal swelling? No. Pain Score  0 *  Have you tolerated food without any problems? Yes.    Have you been able to return to your normal activities? Yes.    Do you have any questions about your discharge instructions: Diet   No. Medications  No. Follow up visit  No.  Do you have questions or concerns about your Care? No.  Actions: * If pain score is 4 or above: No action needed, pain <4.

## 2015-08-16 DIAGNOSIS — R338 Other retention of urine: Secondary | ICD-10-CM | POA: Diagnosis not present

## 2015-08-16 DIAGNOSIS — R3912 Poor urinary stream: Secondary | ICD-10-CM | POA: Diagnosis not present

## 2015-08-17 DIAGNOSIS — M4726 Other spondylosis with radiculopathy, lumbar region: Secondary | ICD-10-CM | POA: Diagnosis not present

## 2015-08-17 DIAGNOSIS — M5136 Other intervertebral disc degeneration, lumbar region: Secondary | ICD-10-CM | POA: Diagnosis not present

## 2015-08-18 ENCOUNTER — Other Ambulatory Visit: Payer: Self-pay | Admitting: Family Medicine

## 2015-08-18 ENCOUNTER — Encounter: Payer: Self-pay | Admitting: Gastroenterology

## 2015-08-19 ENCOUNTER — Other Ambulatory Visit: Payer: Self-pay

## 2015-08-19 MED ORDER — DIPHENOXYLATE-ATROPINE 2.5-0.025 MG PO TABS: ORAL_TABLET | ORAL | Status: DC

## 2015-08-21 ENCOUNTER — Other Ambulatory Visit: Payer: Self-pay | Admitting: Cardiology

## 2015-08-23 NOTE — Telephone Encounter (Signed)
Refill called to [facility]

## 2015-08-25 ENCOUNTER — Other Ambulatory Visit: Payer: Self-pay | Admitting: Orthopaedic Surgery

## 2015-08-25 DIAGNOSIS — M4726 Other spondylosis with radiculopathy, lumbar region: Secondary | ICD-10-CM

## 2015-08-25 DIAGNOSIS — M5134 Other intervertebral disc degeneration, thoracic region: Secondary | ICD-10-CM

## 2015-08-26 ENCOUNTER — Ambulatory Visit: Payer: Medicare Other | Admitting: Gastroenterology

## 2015-08-27 ENCOUNTER — Other Ambulatory Visit: Payer: Self-pay | Admitting: Cardiovascular Disease

## 2015-08-27 NOTE — Telephone Encounter (Signed)
REFILL 

## 2015-08-31 DIAGNOSIS — M5136 Other intervertebral disc degeneration, lumbar region: Secondary | ICD-10-CM | POA: Diagnosis not present

## 2015-09-02 ENCOUNTER — Ambulatory Visit: Payer: Medicare Other | Admitting: Cardiology

## 2015-09-03 ENCOUNTER — Ambulatory Visit
Admission: RE | Admit: 2015-09-03 | Discharge: 2015-09-03 | Disposition: A | Discharge status: Home / Self Care | Payer: Medicare Other | Source: Ambulatory Visit | Attending: Orthopaedic Surgery | Admitting: Orthopaedic Surgery

## 2015-09-03 DIAGNOSIS — M4726 Other spondylosis with radiculopathy, lumbar region: Secondary | ICD-10-CM

## 2015-09-03 DIAGNOSIS — M5134 Other intervertebral disc degeneration, thoracic region: Secondary | ICD-10-CM

## 2015-09-06 ENCOUNTER — Other Ambulatory Visit: Payer: Self-pay | Admitting: Family Medicine

## 2015-09-07 NOTE — Telephone Encounter (Signed)
rx called into pharmacy

## 2015-09-13 ENCOUNTER — Other Ambulatory Visit: Payer: Self-pay | Admitting: Physician Assistant

## 2015-09-13 DIAGNOSIS — I208 Other forms of angina pectoris: Secondary | ICD-10-CM

## 2015-09-23 DIAGNOSIS — M5416 Radiculopathy, lumbar region: Secondary | ICD-10-CM | POA: Diagnosis not present

## 2015-09-23 DIAGNOSIS — M5136 Other intervertebral disc degeneration, lumbar region: Secondary | ICD-10-CM | POA: Diagnosis not present

## 2015-09-23 DIAGNOSIS — M791 Myalgia: Secondary | ICD-10-CM | POA: Diagnosis not present

## 2015-09-24 ENCOUNTER — Telehealth: Payer: Self-pay | Admitting: Gastroenterology

## 2015-09-24 NOTE — Telephone Encounter (Signed)
The pt continues to have diarrhea, she had colon 08/12/15 no microscopic colitis.  The pt says she has gotten better using imodium.  She will continue imodium and alter her diet to as needed when she has "flare of IBS"  Pt will call back if her symptoms worsen

## 2015-09-27 ENCOUNTER — Telehealth: Payer: Self-pay | Admitting: Family Medicine

## 2015-09-28 ENCOUNTER — Telehealth: Payer: Self-pay | Admitting: *Deleted

## 2015-09-28 MED ORDER — LOPERAMIDE HCL 2 MG PO CAPS: ORAL_CAPSULE | ORAL | 3 refills | Status: DC

## 2015-09-28 NOTE — Telephone Encounter (Signed)
Okay at current level for 3 fills. Thanks ws

## 2015-09-28 NOTE — Telephone Encounter (Signed)
Request from pharmacy for refill on loperamide 2 mg, take QID, prn,  QTY 120.  Patient has an appointment with Dr. Livia Snellen tomorrow.

## 2015-09-29 ENCOUNTER — Ambulatory Visit: Payer: Medicare Other | Admitting: Family Medicine

## 2015-09-30 ENCOUNTER — Encounter: Payer: Self-pay | Admitting: Family Medicine

## 2015-10-03 ENCOUNTER — Emergency Department (HOSPITAL_COMMUNITY): Payer: Medicare Other

## 2015-10-03 ENCOUNTER — Encounter (HOSPITAL_COMMUNITY): Payer: Self-pay | Admitting: Emergency Medicine

## 2015-10-03 ENCOUNTER — Inpatient Hospital Stay (HOSPITAL_COMMUNITY): Payer: Medicare Other

## 2015-10-03 ENCOUNTER — Inpatient Hospital Stay (HOSPITAL_COMMUNITY)
Admission: EM | Admit: 2015-10-03 | Discharge: 2015-10-06 | DRG: 470 | Disposition: A | Discharge status: Home Health | Payer: Medicare Other | Attending: Family Medicine | Admitting: Family Medicine

## 2015-10-03 DIAGNOSIS — Z7951 Long term (current) use of inhaled steroids: Secondary | ICD-10-CM

## 2015-10-03 DIAGNOSIS — M25551 Pain in right hip: Secondary | ICD-10-CM | POA: Diagnosis not present

## 2015-10-03 DIAGNOSIS — D62 Acute posthemorrhagic anemia: Secondary | ICD-10-CM | POA: Diagnosis not present

## 2015-10-03 DIAGNOSIS — F419 Anxiety disorder, unspecified: Secondary | ICD-10-CM | POA: Diagnosis present

## 2015-10-03 DIAGNOSIS — Z9841 Cataract extraction status, right eye: Secondary | ICD-10-CM | POA: Diagnosis not present

## 2015-10-03 DIAGNOSIS — S6991XA Unspecified injury of right wrist, hand and finger(s), initial encounter: Secondary | ICD-10-CM | POA: Diagnosis not present

## 2015-10-03 DIAGNOSIS — K219 Gastro-esophageal reflux disease without esophagitis: Secondary | ICD-10-CM | POA: Diagnosis not present

## 2015-10-03 DIAGNOSIS — K589 Irritable bowel syndrome without diarrhea: Secondary | ICD-10-CM | POA: Diagnosis not present

## 2015-10-03 DIAGNOSIS — M25531 Pain in right wrist: Secondary | ICD-10-CM | POA: Diagnosis not present

## 2015-10-03 DIAGNOSIS — C801 Malignant (primary) neoplasm, unspecified: Secondary | ICD-10-CM | POA: Diagnosis not present

## 2015-10-03 DIAGNOSIS — Z7982 Long term (current) use of aspirin: Secondary | ICD-10-CM

## 2015-10-03 DIAGNOSIS — Z09 Encounter for follow-up examination after completed treatment for conditions other than malignant neoplasm: Secondary | ICD-10-CM

## 2015-10-03 DIAGNOSIS — Z923 Personal history of irradiation: Secondary | ICD-10-CM

## 2015-10-03 DIAGNOSIS — S72001A Fracture of unspecified part of neck of right femur, initial encounter for closed fracture: Secondary | ICD-10-CM | POA: Diagnosis present

## 2015-10-03 DIAGNOSIS — M169 Osteoarthritis of hip, unspecified: Secondary | ICD-10-CM | POA: Diagnosis not present

## 2015-10-03 DIAGNOSIS — E785 Hyperlipidemia, unspecified: Secondary | ICD-10-CM | POA: Diagnosis not present

## 2015-10-03 DIAGNOSIS — D638 Anemia in other chronic diseases classified elsewhere: Secondary | ICD-10-CM | POA: Diagnosis not present

## 2015-10-03 DIAGNOSIS — Z859 Personal history of malignant neoplasm, unspecified: Secondary | ICD-10-CM

## 2015-10-03 DIAGNOSIS — W19XXXA Unspecified fall, initial encounter: Secondary | ICD-10-CM | POA: Diagnosis not present

## 2015-10-03 DIAGNOSIS — S72041A Displaced fracture of base of neck of right femur, initial encounter for closed fracture: Secondary | ICD-10-CM | POA: Diagnosis not present

## 2015-10-03 DIAGNOSIS — S72011A Unspecified intracapsular fracture of right femur, initial encounter for closed fracture: Secondary | ICD-10-CM | POA: Diagnosis not present

## 2015-10-03 DIAGNOSIS — Z419 Encounter for procedure for purposes other than remedying health state, unspecified: Secondary | ICD-10-CM

## 2015-10-03 DIAGNOSIS — Z471 Aftercare following joint replacement surgery: Secondary | ICD-10-CM | POA: Diagnosis not present

## 2015-10-03 DIAGNOSIS — Z87891 Personal history of nicotine dependence: Secondary | ICD-10-CM | POA: Diagnosis not present

## 2015-10-03 DIAGNOSIS — Z96641 Presence of right artificial hip joint: Secondary | ICD-10-CM | POA: Diagnosis not present

## 2015-10-03 DIAGNOSIS — I10 Essential (primary) hypertension: Secondary | ICD-10-CM | POA: Diagnosis not present

## 2015-10-03 DIAGNOSIS — S72091A Other fracture of head and neck of right femur, initial encounter for closed fracture: Secondary | ICD-10-CM | POA: Diagnosis not present

## 2015-10-03 DIAGNOSIS — Z85828 Personal history of other malignant neoplasm of skin: Secondary | ICD-10-CM

## 2015-10-03 DIAGNOSIS — E559 Vitamin D deficiency, unspecified: Secondary | ICD-10-CM | POA: Diagnosis present

## 2015-10-03 DIAGNOSIS — W010XXA Fall on same level from slipping, tripping and stumbling without subsequent striking against object, initial encounter: Secondary | ICD-10-CM | POA: Diagnosis present

## 2015-10-03 DIAGNOSIS — I2510 Atherosclerotic heart disease of native coronary artery without angina pectoris: Secondary | ICD-10-CM | POA: Diagnosis not present

## 2015-10-03 DIAGNOSIS — I272 Other secondary pulmonary hypertension: Secondary | ICD-10-CM | POA: Diagnosis present

## 2015-10-03 DIAGNOSIS — S63501A Unspecified sprain of right wrist, initial encounter: Secondary | ICD-10-CM

## 2015-10-03 DIAGNOSIS — Z9842 Cataract extraction status, left eye: Secondary | ICD-10-CM | POA: Diagnosis not present

## 2015-10-03 DIAGNOSIS — S72001D Fracture of unspecified part of neck of right femur, subsequent encounter for closed fracture with routine healing: Secondary | ICD-10-CM | POA: Diagnosis not present

## 2015-10-03 DIAGNOSIS — I25119 Atherosclerotic heart disease of native coronary artery with unspecified angina pectoris: Secondary | ICD-10-CM | POA: Diagnosis not present

## 2015-10-03 LAB — CBC WITH DIFFERENTIAL/PLATELET
Basophils Absolute: 0 10*3/uL (ref 0.0–0.1)
Basophils Relative: 0 %
Eosinophils Absolute: 0.2 10*3/uL (ref 0.0–0.7)
Eosinophils Relative: 2 %
HCT: 34.3 % — ABNORMAL LOW (ref 36.0–46.0)
Hemoglobin: 10.8 g/dL — ABNORMAL LOW (ref 12.0–15.0)
Lymphocytes Relative: 24 %
Lymphs Abs: 2.2 10*3/uL (ref 0.7–4.0)
MCH: 29.8 pg (ref 26.0–34.0)
MCHC: 31.5 g/dL (ref 30.0–36.0)
MCV: 94.5 fL (ref 78.0–100.0)
Monocytes Absolute: 0.5 10*3/uL (ref 0.1–1.0)
Monocytes Relative: 5 %
Neutro Abs: 6.2 10*3/uL (ref 1.7–7.7)
Neutrophils Relative %: 69 %
Platelets: 415 10*3/uL — ABNORMAL HIGH (ref 150–400)
RBC: 3.63 MIL/uL — ABNORMAL LOW (ref 3.87–5.11)
RDW: 13.3 % (ref 11.5–15.5)
WBC: 9 10*3/uL (ref 4.0–10.5)

## 2015-10-03 LAB — URINE MICROSCOPIC-ADD ON

## 2015-10-03 LAB — ABO/RH: ABO/RH(D): O POS

## 2015-10-03 LAB — URINALYSIS, ROUTINE W REFLEX MICROSCOPIC
Bilirubin Urine: NEGATIVE
Glucose, UA: NEGATIVE mg/dL
Hgb urine dipstick: NEGATIVE
Ketones, ur: NEGATIVE mg/dL
Nitrite: NEGATIVE
Protein, ur: NEGATIVE mg/dL
Specific Gravity, Urine: 1.009 (ref 1.005–1.030)
pH: 6 (ref 5.0–8.0)

## 2015-10-03 LAB — BASIC METABOLIC PANEL
Anion gap: 8 (ref 5–15)
BUN: 11 mg/dL (ref 6–20)
CO2: 27 mmol/L (ref 22–32)
Calcium: 9.6 mg/dL (ref 8.9–10.3)
Chloride: 101 mmol/L (ref 101–111)
Creatinine, Ser: 1.01 mg/dL — ABNORMAL HIGH (ref 0.44–1.00)
GFR calc Af Amer: >60 mL/min (ref >60)
GFR calc non Af Amer: 54 mL/min — ABNORMAL LOW (ref >60)
Glucose, Bld: 97 mg/dL (ref 65–99)
Potassium: 4.5 mmol/L (ref 3.5–5.1)
Sodium: 136 mmol/L (ref 135–145)

## 2015-10-03 LAB — PROTIME-INR
INR: 1.13
Prothrombin Time: 14.6 seconds (ref 11.4–15.2)

## 2015-10-03 LAB — TYPE AND SCREEN
ABO/RH(D): O POS
Antibody Screen: NEGATIVE

## 2015-10-03 MED ORDER — VERAPAMIL HCL 80 MG PO TABS
80.0000 mg | ORAL_TABLET | Freq: Four times a day (QID) | ORAL | Status: DC
  Administered 2015-10-03 – 2015-10-06 (×7): 80 mg via ORAL
  Filled 2015-10-03 (×13): qty 1

## 2015-10-03 MED ORDER — SODIUM CHLORIDE 0.9 % IV SOLN
INTRAVENOUS | Status: DC
  Administered 2015-10-03: 18:00:00 via INTRAVENOUS

## 2015-10-03 MED ORDER — OXYCODONE-ACETAMINOPHEN 5-325 MG PO TABS
ORAL_TABLET | ORAL | Status: AC
  Filled 2015-10-03: qty 1

## 2015-10-03 MED ORDER — MORPHINE SULFATE (PF) 2 MG/ML IV SOLN
0.5000 mg | INTRAVENOUS | Status: DC | PRN
  Administered 2015-10-03: 0.5 mg via INTRAVENOUS
  Filled 2015-10-03: qty 1

## 2015-10-03 MED ORDER — SODIUM CHLORIDE 0.9 % IV SOLN
INTRAVENOUS | Status: DC
Start: 2015-10-03 — End: 2015-10-06
  Administered 2015-10-03 – 2015-10-05 (×2): via INTRAVENOUS

## 2015-10-03 MED ORDER — NITROGLYCERIN 0.4 MG SL SUBL: 0.4000 mg | SUBLINGUAL_TABLET | SUBLINGUAL | Status: DC | PRN

## 2015-10-03 MED ORDER — OXYCODONE-ACETAMINOPHEN 5-325 MG PO TABS
1.0000 | ORAL_TABLET | Freq: Once | ORAL | Status: AC
  Administered 2015-10-03: 1 via ORAL

## 2015-10-03 MED ORDER — ALPRAZOLAM 0.5 MG PO TABS
0.5000 mg | ORAL_TABLET | Freq: Three times a day (TID) | ORAL | Status: DC
Start: 2015-10-03 — End: 2015-10-06
  Administered 2015-10-03 – 2015-10-06 (×7): 0.5 mg via ORAL
  Filled 2015-10-03 (×7): qty 1

## 2015-10-03 MED ORDER — CHLORHEXIDINE GLUCONATE 4 % EX LIQD
60.0000 mL | Freq: Once | CUTANEOUS | Status: AC
  Administered 2015-10-03: 4 via TOPICAL

## 2015-10-03 MED ORDER — FLUTICASONE PROPIONATE 50 MCG/ACT NA SUSP
2.0000 | Freq: Every day | NASAL | Status: DC | PRN
  Filled 2015-10-03: qty 16

## 2015-10-03 MED ORDER — LOPERAMIDE HCL 2 MG PO CAPS: 2.0000 mg | ORAL_CAPSULE | Freq: Four times a day (QID) | ORAL | Status: DC | PRN

## 2015-10-03 MED ORDER — LORATADINE 10 MG PO TABS
10.0000 mg | ORAL_TABLET | Freq: Every day | ORAL | Status: DC
  Administered 2015-10-05 – 2015-10-06 (×2): 10 mg via ORAL
  Filled 2015-10-03 (×2): qty 1

## 2015-10-03 MED ORDER — ISOSORBIDE MONONITRATE ER 30 MG PO TB24
15.0000 mg | ORAL_TABLET | Freq: Every day | ORAL | Status: DC
  Administered 2015-10-05 – 2015-10-06 (×2): 15 mg via ORAL
  Filled 2015-10-03 (×2): qty 1

## 2015-10-03 MED ORDER — SODIUM CHLORIDE 0.9 % IV SOLN
1000.0000 mg | INTRAVENOUS | Status: AC
  Administered 2015-10-04: 1000 mg via INTRAVENOUS
  Filled 2015-10-03 (×2): qty 10

## 2015-10-03 MED ORDER — POLYETHYLENE GLYCOL 3350 17 G PO PACK: 17.0000 g | PACK | Freq: Every day | ORAL | Status: DC | PRN

## 2015-10-03 MED ORDER — MORPHINE SULFATE (PF) 4 MG/ML IV SOLN
4.0000 mg | Freq: Once | INTRAVENOUS | Status: AC
  Administered 2015-10-03: 4 mg via INTRAVENOUS
  Filled 2015-10-03: qty 1

## 2015-10-03 MED ORDER — CEFAZOLIN SODIUM-DEXTROSE 2-4 GM/100ML-% IV SOLN
2.0000 g | INTRAVENOUS | Status: AC
  Administered 2015-10-04: 2 g via INTRAVENOUS
  Filled 2015-10-03 (×2): qty 100

## 2015-10-03 MED ORDER — ACETAMINOPHEN 10 MG/ML IV SOLN
1000.0000 mg | INTRAVENOUS | Status: AC
  Administered 2015-10-04: 1000 mg via INTRAVENOUS
  Filled 2015-10-03: qty 100

## 2015-10-03 MED ORDER — MORPHINE SULFATE (PF) 4 MG/ML IV SOLN
4.0000 mg | Freq: Once | INTRAVENOUS | Status: AC
Start: 2015-10-03 — End: 2015-10-03
  Administered 2015-10-03: 4 mg via INTRAVENOUS
  Filled 2015-10-03: qty 1

## 2015-10-03 MED ORDER — HYDROCODONE-ACETAMINOPHEN 5-325 MG PO TABS
1.0000 | ORAL_TABLET | Freq: Four times a day (QID) | ORAL | Status: DC | PRN
  Administered 2015-10-03 – 2015-10-04 (×2): 2 via ORAL
  Filled 2015-10-03 (×2): qty 2

## 2015-10-03 MED ORDER — PANTOPRAZOLE SODIUM 40 MG PO TBEC
40.0000 mg | DELAYED_RELEASE_TABLET | Freq: Two times a day (BID) | ORAL | Status: DC
  Administered 2015-10-03 – 2015-10-06 (×5): 40 mg via ORAL
  Filled 2015-10-03 (×5): qty 1

## 2015-10-03 MED ORDER — ASPIRIN EC 81 MG PO TBEC: 81.0000 mg | DELAYED_RELEASE_TABLET | Freq: Every day | ORAL | Status: DC

## 2015-10-03 NOTE — Consult Note (Signed)
ORTHOPAEDIC CONSULTATION  REQUESTING PHYSICIAN: Jonetta Osgood, MD  PCP:  [name], MD  Chief Complaint: Right wrist and hip injury  HPI: Amy Black is a 75 y.o. female who got her right foot caught up in the bedspread earlier today, causing her to fall onto her right hip. She broke her fall with her right upper extremity. She came to the emergency department with right hip pain and inability to weight-bear. X-rays of the right hip revealed a displaced, comminuted subcapital right femoral neck fracture. X-rays of the right wrist were read as negative by radiology, however I question whether or not she has a nondisplaced fracture seen on the lateral view. She denies other injuries. She has a history of chronic right lower extremity weakness which has been worked up by a Publishing rights manager. She tells me that she normally ambulates with a cane.  Past Medical History:  Diagnosis Date  . Adenocarcinoma (HCC)    LEFT LEG  . Adenocarcinoma of unknown primary ([place]) 02/06/2011  . 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 Allergy   . Anxiety   . CAD (coronary artery disease)   . Cataract    bilateral cataracts removed  . Coronary artery spasm ([place])   . Diverticulosis of colon (without mention of hemorrhage)   . GERD (gastroesophageal reflux disease)   . hepatic cyst   . History of cardiac catheterization   . History of nuclear stress test 04/2011   lexiscan; normal study, no significant ischemia, low risk   . HTN (hypertension)    PT. DENIES  . Hyperlipidemia   . Insomnia   . Irritable bowel syndrome   . Transverse myelitis ([place])   . Unstable angina ([place])   . Vitamin D deficiency    Past Surgical History:  Procedure Laterality Date  . ABDOMINAL HYSTERECTOMY    . CARDIAC CATHETERIZATION     coronary spasm  . CARDIAC CATHETERIZATION N/A 08/28/2014   Procedure: Left Heart Cath and Coronary Angiography;  Surgeon: Jettie Booze, MD;  Location:  [facility];  Service: Cardiovascular;  Laterality: N/A;  . COLONOSCOPY    . DIAGNOSTIC LAPAROSCOPY    . LYSIS OF ADHESION    . PELVIC LAPAROSCOPY  1992   RSO, AND LSO ON 2007  . RESECTION SOFT TISSUE TUMOR LEG / ANKLE RADICAL  2004   leg lesion resection & radiation  . SALPINGOOPHORECTOMY Left   . TRANSTHORACIC ECHOCARDIOGRAM  07/2012   LV cavity size mildly reduced, normal wall motion; MV with calcified annulus and mild MR; LA mildly dilated; atrial septum with increased thickness - lipomatous hypertrophy; RV systolic pressure increased (borderline pulm HTN)  . UPPER GASTROINTESTINAL ENDOSCOPY     Social History   Social History  . Marital status: Married    Spouse name: N/A  . Number of children: 2  . Years of education: N/A   Occupational History  . Disabilty   . DISABILITY Unemployed   Social History Main Topics  . Smoking status: Former Smoker    Packs/day: 0.50    Years: 4.00    Types: Cigarettes    Start date: 12/07/1968    Quit date: 11/20/1972  . Smokeless tobacco: Never Used     Comment: quit 40 years ago  . Alcohol use No  . Drug use: No  . Sexual activity: No   Other Topics Concern  . None   Social History Narrative  . None   Family  History  Problem Relation Age of Onset  . Bladder Cancer Mother   . Heart disease Mother   . Hypertension Mother   . Heart disease Father   . Stroke Father   . Diabetes      Multiple family members on both sides   . Coronary artery disease      Multiple family members on both sides   . Colon cancer Neg Hx   . Esophageal cancer Neg Hx   . Pancreatic cancer Neg Hx   . Rectal cancer Neg Hx   . Stomach cancer Neg Hx    Allergies  Allergen Reactions  . Iodine Itching  . Cymbalta [Duloxetine Hcl] Other (See Comments)    sleepiness  . Gabapentin Other (See Comments)    sleepiness  . Ivp Dye [Iodinated Diagnostic Agents] Itching  . Zanaflex [Tizanidine Hcl] Other (See Comments)    Very drunk feeling next day     Prior to Admission medications   Medication Sig Start Date End Date Taking? Authorizing Provider  ALPRAZolam Duanne Moron) 0.5 MG tablet TAKE ONE TABLET BY MOUTH THREE TIMES DAILY 09/07/15  Yes Sharion Balloon, FNP  amitriptyline (ELAVIL) 10 MG tablet Take 1-3 tablets by mouth at bedtime as needed for sleep.  09/23/15  Yes Historical Provider, MD  aspirin 81 MG tablet Take 1 tablet (81 mg total) by mouth daily. Patient taking differently: Take 81 mg by mouth every morning.  02/16/12  Yes Daniel J Angiulli, PA-C  cetirizine (ZYRTEC) 10 MG tablet Take 1 tablet (10 mg total) by mouth 2 (two) times daily. Patient taking differently: Take 10 mg by mouth as needed for allergies.  08/18/13  Yes Lysbeth Penner, FNP  diphenoxylate-atropine (LOMOTIL) 2.5-0.025 MG tablet TAKE ONE TABLET BY MOUTH FOUR TIMES DAILY AS NEEDED Patient taking differently: Take 1 tablet by mouth 4 (four) times daily as needed for diarrhea or loose stools.  08/19/15  Yes Sharion Balloon, FNP  fluticasone (FLONASE) 50 MCG/ACT nasal spray Place 2 sprays into both nostrils daily. Patient taking differently: Place 2 sprays into both nostrils daily as needed for allergies.  10/29/14  Yes Tiffany A Gann, PA-C  hydrocortisone (ANUSOL-HC) 2.5 % rectal cream Apply rectally 2 times daily as needed Patient taking differently: Place 1 application rectally. Apply rectally 2 times daily as needed 07/09/15  Yes Sharion Balloon, FNP  isosorbide mononitrate (IMDUR) 30 MG 24 hr tablet TAKE 1/2 TABLET BY MOUTH DAILY. Patient taking differently: TAKE 1/2 TABLET (15 mg) BY MOUTH DAILY. 09/13/15  Yes Minus Breeding, MD  loperamide (IMODIUM) 2 MG capsule TAKE ONE CAPSULE BY MOUTH FOUR TIMES DAILY AS NEEDED FOR DIARRHEA / LOOSE STOOLS Patient taking differently: Take 2 mg by mouth 4 (four) times daily as needed for diarrhea or loose stools.  09/28/15  Yes [name], MD  NITROSTAT 0.4 MG SL tablet DISSOLVE ONE TABLET UNDER TONGUE EVERY 5 MINUTES UP TO 3 DOSES AS  NEEDED FOR CHEST PAIN Patient taking differently: DISSOLVE ONE TABLET (0.4 mg) UNDER TONGUE EVERY 5 MINUTES UP TO 3 DOSES AS NEEDED FOR CHEST PAIN 01/04/15  Yes Pixie Casino, MD  pantoprazole (PROTONIX) 40 MG tablet Take 1 tablet (40 mg total) by mouth 2 (two) times daily. KEEP OV. 08/27/15  Yes Lorretta Harp, MD  traMADol (ULTRAM) 50 MG tablet TAKE ONE TABLET BY MOUTH EVERY TWELVE HOURS AS NEEDED. Patient taking differently: TAKE ONE TABLET (50 mg) BY MOUTH EVERY TWELVE HOURS AS NEEDED for pain 08/09/15  Yes Sharion Balloon, FNP  triamterene-hydrochlorothiazide (DYAZIDE) 37.5-25 MG capsule Take 1 each (1 capsule total) by mouth daily. Patient taking differently: Take 1 capsule by mouth every Monday, Wednesday, and Friday.  11/10/14  Yes Jerline Pain, MD  verapamil (CALAN) 80 MG tablet TAKE ONE TABLET BY MOUTH FOUR TIMES DAILY Patient taking differently: TAKE ONE TABLET (80 mg) BY MOUTH FOUR TIMES DAILY 05/26/15  Yes Minus Breeding, MD  Vitamin D, Ergocalciferol, (DRISDOL) 50000 units CAPS capsule Take 50,000 Units by mouth every Monday. 09/13/15  Yes Historical Provider, MD   Dg Wrist Complete Right  Result Date: 10/03/2015 CLINICAL DATA:  Wrist pain after falling today.  Initial encounter. EXAM: RIGHT WRIST - COMPLETE 3+ VIEW COMPARISON:  None. FINDINGS: The mineralization and alignment are normal. There is no evidence of acute fracture or dislocation. There are minimal degenerative changes for age. There is an ulnar minus variance at the wrist. No focal soft tissue abnormalities are seen. IMPRESSION: No evidence of acute fracture or dislocation. Electronically Signed   By: Richardean Sale M.D.   On: 10/03/2015 17:04   Dg Knee Right Port  Result Date: 10/03/2015 CLINICAL DATA:  Femoral neck fracture. EXAM: PORTABLE RIGHT KNEE - 1-2 VIEW COMPARISON:  None. FINDINGS: No evidence of fracture, dislocation, or joint effusion. There are 3 compartment osteoarthritic changes of the right knee, worse at  the patellofemoral compartment. Well corticated calcific density anterior to the distal femur may represent calcific tendinitis or intracapsular loose body. Soft tissues are otherwise unremarkable. IMPRESSION: No acute fracture or dislocation identified about the right Knee. Three compartment osteoarthritis, worse at the patellofemoral compartment. Electronically Signed   By: Fidela Salisbury M.D.   On: 10/03/2015 18:45   Dg Hip Unilat With Pelvis 2-3 Views Right  Result Date: 10/03/2015 CLINICAL DATA:  Right hip pain status post fall. EXAM: DG HIP (WITH OR WITHOUT PELVIS) 2-3V RIGHT COMPARISON:  None. FINDINGS: There is an impacted subcapital right femoral neck fracture with minimal medial displacement of the distal fracture fragment. No other fractures are seen. Soft tissues are grossly normal. IMPRESSION: Impacted subcapital right femoral neck fracture. Electronically Signed   By: Fidela Salisbury M.D.   On: 10/03/2015 17:03    Positive ROS: All other systems have been reviewed and were otherwise negative with the exception of those mentioned in the HPI and as above.  Physical Exam: General: Alert, no acute distress Cardiovascular: No pedal edema Respiratory: No cyanosis, no use of accessory musculature GI: No organomegaly, abdomen is soft and non-tender Skin: No lesions in the area of chief complaint Neurologic: Sensation intact distally Psychiatric: Patient is competent for consent with normal mood and affect Lymphatic: No axillary or cervical lymphadenopathy  MUSCULOSKELETAL: RUE: No skin wounds or lesions. She does have swelling and tenderness to palpation over the dorsum of the wrist. Grip strength is limited by wrist pain. She has a palpable pulse. No focal motor or sensory deficit. No acute carpal tunnel syndrome. Elbow and shoulder are normal.  RLE: Shortened and externally rotated. Skin is intact. She does have mild weakness in the right foot when compared to the left. She has  a palpable pulse. She reports intact sensation.  LUE/LLE: No tenderness to palpation or swelling. She can perform a straight leg raise. Foot, ankle, knee, hand, wrist, elbow, and shoulder are nontender with full range of motion. She is neurovascularly intact.  Assessment: Status post ground-level fall at home with: #1 right wrist pain concerning for fracture #2 displaced right  hip fracture  Plan: I discussed the findings with the patient. We discussed the risks, benefits, and alternatives to right total hip arthroplasty, which I think is the appropriate treatment given her relative young age and activity level. With respect to her right wrist, I recommended a CT scan to rule out fracture. Nothing by mouth after midnight. Hold chemical DVT prophylaxis. Bed rest for now. Plan for right total hip arthroplasty tomorrow.   The risks, benefits, and alternatives were discussed with the patient. There are risks associated with the surgery including, but not limited to, problems with anesthesia (death), infection, instability (giving out of the joint), dislocation, differences in leg length/angulation/rotation, fracture of bones, loosening or failure of implants, hematoma (blood accumulation) which may require surgical drainage, blood clots, pulmonary embolism, nerve injury (foot drop and lateral thigh numbness), and blood vessel injury. The patient understands these risks and elects to proceed.    Amy Black, Horald Pollen, MD Cell [phone]    10/03/2015 7:45 PM

## 2015-10-03 NOTE — ED Notes (Signed)
Xray called to inform hip fracture. Charge nurse made aware. Pt to be placed on a bed in hallway and moved into next treatment room.

## 2015-10-03 NOTE — H&P (Signed)
HISTORY AND PHYSICAL       PATIENT DETAILS Name: Amy Black Age: 75 y.o. Sex: female Date of Birth: 03-16-1940 Admit Date: 10/03/2015 [id number], MD   Patient coming from: Home   CHIEF COMPLAINT:  Right hip pain following a mechanical fall  HPI: Amy Black is a 75 y.o. female with medical history significant of hypertension, anxiety, GERD, irritable bowel syndrome, history of adenocarcinoma of unknown primary-brought to the ED for evaluation after sustaining a mechanical fall. Per patient, she sustained a mechanical fall after she was attempting to get out of bed earlier this afternoon. There was no chest pain or shortness of breath. She started having right hip pain immediately after falling. She was subsequently brought to the emergency room where an x-ray of the right hip showed a subcapital fracture. Orthopedics has already been consulted by the emergency room, and I was asked to admit this patient for further evaluation and treatment.  ED Course: Labs currently pending, x-ray of the right hip showed a impacted subcapital right femoral fracture. Orthopedics-Dr. Ralene Bathe has already been consulted, recommendations are to keep nothing by mouth post midnight, plans are to repair the hip in the morning.  Note: Lives at: Home Mobility: Cane Chronic Indwelling Foley:no   REVIEW OF SYSTEMS:  Constitutional:   No  weight loss, night sweats,  Fevers, chills, fatigue.  HEENT:    No headaches, Dysphagia,Tooth/dental problems,Sore throat,  No sneezing, itching, ear ache, nasal congestion, post nasal drip  Cardio-vascular: No chest pain,Orthopnea, PND,lower extremity edema, anasarca, palpitations  GI:  No heartburn, indigestion, abdominal pain, nausea, vomiting, diarrhea, melena or hematochezia  Resp: No shortness of breath, cough, hemoptysis,plueritic chest pain.   Skin:  No rash or lesions.  GU:  No dysuria, change in color of urine, no urgency  or frequency.  No flank pain.  Musculoskeletal: No joint pain or swelling.  No decreased range of motion.  No back pain.  Endocrine: No heat intolerance, no cold intolerance, no polyuria, no polydipsia  Psych: No change in mood or affect. No depression or anxiety.  No memory loss.   ALLERGIES:   Allergies  Allergen Reactions  . Iodine Itching  . Cymbalta [Duloxetine Hcl] Other (See Comments)    sleepiness  . Gabapentin Other (See Comments)    sleepiness  . Ivp Dye [Iodinated Diagnostic Agents] Itching  . Zanaflex [Tizanidine Hcl] Other (See Comments)    Very drunk feeling next day    PAST MEDICAL HISTORY: Past Medical History:  Diagnosis Date  . Adenocarcinoma (HCC)    LEFT LEG  . Adenocarcinoma of unknown primary ([place]) 02/06/2011  . 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 Allergy   . Anxiety   . CAD (coronary artery disease)   . Cataract    bilateral cataracts removed  . Coronary artery spasm ([place])   . Diverticulosis of colon (without mention of hemorrhage)   . GERD (gastroesophageal reflux disease)   . hepatic cyst   . History of cardiac catheterization   . History of nuclear stress test 04/2011   lexiscan; normal study, no significant ischemia, low risk   . HTN (hypertension)    PT. DENIES  . Hyperlipidemia   . Insomnia   . Irritable bowel syndrome   . Transverse myelitis ([place])   . Unstable angina ([place])   . Vitamin D deficiency     PAST SURGICAL HISTORY: Past Surgical History:  Procedure Laterality  Date  . ABDOMINAL HYSTERECTOMY    . CARDIAC CATHETERIZATION     coronary spasm  . CARDIAC CATHETERIZATION N/A 08/28/2014   Procedure: Left Heart Cath and Coronary Angiography;  Surgeon: Jettie Booze, MD;  Location: [facility];  Service: Cardiovascular;  Laterality: N/A;  . COLONOSCOPY    . DIAGNOSTIC LAPAROSCOPY    . LYSIS OF ADHESION    . PELVIC LAPAROSCOPY  1992   RSO, AND LSO ON 2007  . RESECTION SOFT TISSUE  TUMOR LEG / ANKLE RADICAL  2004   leg lesion resection & radiation  . SALPINGOOPHORECTOMY Left   . TRANSTHORACIC ECHOCARDIOGRAM  07/2012   LV cavity size mildly reduced, normal wall motion; MV with calcified annulus and mild MR; LA mildly dilated; atrial septum with increased thickness - lipomatous hypertrophy; RV systolic pressure increased (borderline pulm HTN)  . UPPER GASTROINTESTINAL ENDOSCOPY      MEDICATIONS AT HOME: Prior to Admission medications   Medication Sig Start Date End Date Taking? Authorizing Provider  ALPRAZolam Duanne Moron) 0.5 MG tablet TAKE ONE TABLET BY MOUTH THREE TIMES DAILY 09/07/15  Yes Sharion Balloon, FNP  amitriptyline (ELAVIL) 10 MG tablet Take 1-3 tablets by mouth at bedtime as needed for sleep.  09/23/15  Yes Historical Provider, MD  aspirin 81 MG tablet Take 1 tablet (81 mg total) by mouth daily. Patient taking differently: Take 81 mg by mouth every morning.  02/16/12  Yes Daniel J Angiulli, PA-C  cetirizine (ZYRTEC) 10 MG tablet Take 1 tablet (10 mg total) by mouth 2 (two) times daily. Patient taking differently: Take 10 mg by mouth as needed for allergies.  08/18/13  Yes Lysbeth Penner, FNP  diphenoxylate-atropine (LOMOTIL) 2.5-0.025 MG tablet TAKE ONE TABLET BY MOUTH FOUR TIMES DAILY AS NEEDED Patient taking differently: Take 1 tablet by mouth 4 (four) times daily as needed for diarrhea or loose stools.  08/19/15  Yes Sharion Balloon, FNP  fluticasone (FLONASE) 50 MCG/ACT nasal spray Place 2 sprays into both nostrils daily. Patient taking differently: Place 2 sprays into both nostrils daily as needed for allergies.  10/29/14  Yes Amy A Gann, PA-C  hydrocortisone (ANUSOL-HC) 2.5 % rectal cream Apply rectally 2 times daily as needed Patient taking differently: Place 1 application rectally. Apply rectally 2 times daily as needed 07/09/15  Yes Sharion Balloon, FNP  isosorbide mononitrate (IMDUR) 30 MG 24 hr tablet TAKE 1/2 TABLET BY MOUTH DAILY. Patient taking  differently: TAKE 1/2 TABLET (15 mg) BY MOUTH DAILY. 09/13/15  Yes Minus Breeding, MD  loperamide (IMODIUM) 2 MG capsule TAKE ONE CAPSULE BY MOUTH FOUR TIMES DAILY AS NEEDED FOR DIARRHEA / LOOSE STOOLS Patient taking differently: Take 2 mg by mouth 4 (four) times daily as needed for diarrhea or loose stools.  09/28/15  Yes [name], MD  NITROSTAT 0.4 MG SL tablet DISSOLVE ONE TABLET UNDER TONGUE EVERY 5 MINUTES UP TO 3 DOSES AS NEEDED FOR CHEST PAIN Patient taking differently: DISSOLVE ONE TABLET (0.4 mg) UNDER TONGUE EVERY 5 MINUTES UP TO 3 DOSES AS NEEDED FOR CHEST PAIN 01/04/15  Yes Pixie Casino, MD  pantoprazole (PROTONIX) 40 MG tablet Take 1 tablet (40 mg total) by mouth 2 (two) times daily. KEEP OV. 08/27/15  Yes Lorretta Harp, MD  traMADol (ULTRAM) 50 MG tablet TAKE ONE TABLET BY MOUTH EVERY TWELVE HOURS AS NEEDED. Patient taking differently: TAKE ONE TABLET (50 mg) BY MOUTH EVERY TWELVE HOURS AS NEEDED for pain 08/09/15  Yes [employer]  A Hawks, FNP  triamterene-hydrochlorothiazide (DYAZIDE) 37.5-25 MG capsule Take 1 each (1 capsule total) by mouth daily. Patient taking differently: Take 1 capsule by mouth every Monday, Wednesday, and Friday.  11/10/14  Yes Jerline Pain, MD  verapamil (CALAN) 80 MG tablet TAKE ONE TABLET BY MOUTH FOUR TIMES DAILY Patient taking differently: TAKE ONE TABLET (80 mg) BY MOUTH FOUR TIMES DAILY 05/26/15  Yes Minus Breeding, MD  Vitamin D, Ergocalciferol, (DRISDOL) 50000 units CAPS capsule Take 50,000 Units by mouth every Monday. 09/13/15  Yes Historical Provider, MD    FAMILY HISTORY: Family History  Problem Relation Age of Onset  . Bladder Cancer Mother   . Heart disease Mother   . Hypertension Mother   . Heart disease Father   . Stroke Father   . Diabetes      Multiple family members on both sides   . Coronary artery disease      Multiple family members on both sides   . Colon cancer Neg Hx   . Esophageal cancer Neg Hx   . Pancreatic cancer Neg Hx     . Rectal cancer Neg Hx   . Stomach cancer Neg Hx    SOCIAL HISTORY:  reports that she quit smoking about 42 years ago. Her smoking use included Cigarettes. She started smoking about 46 years ago. She has a 2.00 pack-year smoking history. She has never used smokeless tobacco. She reports that she does not drink alcohol or use drugs.  PHYSICAL EXAM: Blood pressure 138/80, pulse 73, temperature 97.9 F (36.6 C), temperature source Oral, resp. rate 15, height 5\' 3"  (1.6 m), weight 72.6 kg (160 lb), SpO2 100 %.  General appearance :Awake, alert, not in any distress. Speech Clear. Not toxic Looking HEENT: Atraumatic and Normocephalic, pupils equally reactive to light and accomodation Neck: supple, no JVD. No cervical lymphadenopathy.  Chest:Good air entry bilaterally, no added sounds  CVS: S1 S2 regular, no murmurs.  Abdomen: Bowel sounds present, Non tender and not distended with no gaurding, rigidity or rebound. Extremities: B/L Lower Ext shows no edema, both legs are warm to touch Neurology:  Non focal Psychiatric: Normal judgment and insight. Alert and oriented x 3. Normal mood. Skin:No Rash Wounds:N/A  LABS ON ADMISSION:  I have personally reviewed following labs and imaging studies  CBC: No results for input(s): WBC, NEUTROABS, HGB, HCT, MCV, PLT in the last 168 hours.  Basic Metabolic Panel: No results for input(s): NA, K, CL, CO2, GLUCOSE, BUN, CREATININE, CALCIUM, MG, PHOS in the last 168 hours.  GFR: CrCl cannot be calculated (Patient's most recent lab result is older than the maximum 21 days allowed.).  Liver Function Tests: No results for input(s): AST, ALT, ALKPHOS, BILITOT, PROT, ALBUMIN in the last 168 hours. No results for input(s): LIPASE, AMYLASE in the last 168 hours. No results for input(s): AMMONIA in the last 168 hours.  Coagulation Profile: No results for input(s): INR, PROTIME in the last 168 hours.  Cardiac Enzymes: No results for input(s): CKTOTAL,  CKMB, CKMBINDEX, TROPONINI in the last 168 hours.  BNP (last 3 results) No results for input(s): PROBNP in the last 8760 hours.  HbA1C: No results for input(s): HGBA1C in the last 72 hours.  CBG: No results for input(s): GLUCAP in the last 168 hours.  Lipid Profile: No results for input(s): CHOL, HDL, LDLCALC, TRIG, CHOLHDL, LDLDIRECT in the last 72 hours.  Thyroid Function Tests: No results for input(s): TSH, T4TOTAL, FREET4, T3FREE, THYROIDAB in the last 72 hours.  Anemia Panel: No results for input(s): VITAMINB12, FOLATE, FERRITIN, TIBC, IRON, RETICCTPCT in the last 72 hours.  Urine analysis:    Component Value Date/Time   COLORURINE STRAW (A) 07/06/2015 0039   APPEARANCEUR CLEAR 07/06/2015 0039   LABSPEC 1.010 07/06/2015 0039   PHURINE 5.5 07/06/2015 0039   GLUCOSEU NEGATIVE 07/06/2015 0039   HGBUR TRACE (A) 07/06/2015 0039   BILIRUBINUR NEGATIVE 07/06/2015 0039   BILIRUBINUR small 11/16/2014 1136   KETONESUR NEGATIVE 07/06/2015 0039   PROTEINUR NEGATIVE 07/06/2015 0039   UROBILINOGEN negative 11/16/2014 1136   UROBILINOGEN 0.2 06/12/2013 0352   NITRITE NEGATIVE 07/06/2015 0039   LEUKOCYTESUR SMALL (A) 07/06/2015 0039    Sepsis Labs: Lactic Acid, Venous No results found for: [place]   Microbiology: No results found for this or any previous visit (from the past 240 hour(s)).    RADIOLOGIC STUDIES ON ADMISSION: Dg Wrist Complete Right  Result Date: 10/03/2015 CLINICAL DATA:  Wrist pain after falling today.  Initial encounter. EXAM: RIGHT WRIST - COMPLETE 3+ VIEW COMPARISON:  None. FINDINGS: The mineralization and alignment are normal. There is no evidence of acute fracture or dislocation. There are minimal degenerative changes for age. There is an ulnar minus variance at the wrist. No focal soft tissue abnormalities are seen. IMPRESSION: No evidence of acute fracture or dislocation. Electronically Signed   By: Richardean Sale M.D.   On: 10/03/2015 17:04    Dg Hip Unilat With Pelvis 2-3 Views Right  Result Date: 10/03/2015 CLINICAL DATA:  Right hip pain status post fall. EXAM: DG HIP (WITH OR WITHOUT PELVIS) 2-3V RIGHT COMPARISON:  None. FINDINGS: There is an impacted subcapital right femoral neck fracture with minimal medial displacement of the distal fracture fragment. No other fractures are seen. Soft tissues are grossly normal. IMPRESSION: Impacted subcapital right femoral neck fracture. Electronically Signed   By: Fidela Salisbury M.D.   On: 10/03/2015 17:03    I have personally reviewed images of x-ray of the right hip   EKG:  Personally reviewed. Normal sinus rhythm  ASSESSMENT AND PLAN: Present on Admission: . Closed right hip fracture: Secondary to a mechanical fall, okay to proceed with the proposed surgical procedure without any further workup. Note, patient has had a negative cardiac catheterization on 08/28/14. Keep nothing by mouth post midnight, defer initiation of prophylactic DVT prophylaxis at this time.Await formal eval by Ortho.  Marland Kitchen HTN (hypertension): Continue Imdur and verapamil. Follow and titrate accordingly.  Marland Kitchen GERD: Continue PPI  . Nonobstructive Coronary atherosclerosis: LHC on July 2016 showed 20% stenosis in mid LAD, and 20% stenosis in the second diagonal. Continue aspirin.  . Adenocarcinoma of unknown primary/transient thrombocytosis: Stable, followed by oncology. Per last oncology note, no recurrence of cancer.  Marland Kitchen Anxiety: Continue Xanax  . Irritable bowel syndrome: Stable, continue home meds.  Further plan will depend as patient's clinical course evolves and further radiologic and laboratory data become available. Patient will be monitored closely.  Above noted plan was discussed with patient/spouse face to face at bedside, they were in agreement.   CONSULTS: Ortho ([place])  DVT Prophylaxis: SCD's  Code Status: Full Code  Disposition Plan:  Discharge back home vs SNF possibly in 2-3 days,  depending on clinical course  Admission status: Inpatient going to  medical floor   Total time spent  55 minutes.Greater than 50% of this time was spent in counseling, explanation of diagnosis, planning of further management, and coordination of care.  [facility] Pager [phone]  If 7PM-7AM, please  contact night-coverage [email or web address] Password [facility] 10/03/2015, 6:24 PM

## 2015-10-03 NOTE — ED Provider Notes (Signed)
[facility] DEPT Provider Note   CSN: [id number] Arrival date & time: 10/03/15  1451     History   Chief Complaint Chief Complaint  Patient presents with  . Fall  . Hip Injury  . Hip Pain    HPI Amy Black is a 75 y.o. female.  HPI Patient was sitting on her bed and got her feet caught in her bedspread. She went to stand up and that caused her to fall. She landed on her right hip and broke her fall with her right wrist. No associated head injury. No chest pain or abdominal pain. She reports there is severe pain in the right hip and she cannot move it. She reports she also has some pain in the right wrist but that is not too bad. She reports she can move that around although it is somewhat painful.  Review of systems, patient endorsed pain in her left lower quadrant. She reports it has been coming and going for several days. It is currently not present. She has not had any associated symptoms.  Patient's past medical history includes coronary artery disease, hypertension, and right lower extremity weakness starting 2014 that had extensive diagnostic evaluation without clear etiology. Past Medical History:  Diagnosis Date  . Adenocarcinoma (HCC)    LEFT LEG  . Adenocarcinoma of unknown primary ([place]) 02/06/2011  . 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 Allergy   . Anxiety   . CAD (coronary artery disease)   . Cataract    bilateral cataracts removed  . Coronary artery spasm ([place])   . Diverticulosis of colon (without mention of hemorrhage)   . GERD (gastroesophageal reflux disease)   . hepatic cyst   . History of cardiac catheterization   . History of nuclear stress test 04/2011   lexiscan; normal study, no significant ischemia, low risk   . HTN (hypertension)    PT. DENIES  . Hyperlipidemia   . Insomnia   . Irritable bowel syndrome   . Transverse myelitis ([place])   . Unstable angina ([place])   . Vitamin D deficiency     Patient Active  Problem List   Diagnosis Date Noted  . Closed right hip fracture ([place]) 10/03/2015  . Thrombocytosis ([place]) 07/23/2015  . Exertional angina (HCC)   . Baker's cyst of knee 02/14/2013  . Coronary artery spasm ([place]) 11/19/2012  . Transverse myelitis ([place]) 10/10/2012  . HTN (hypertension) 08/29/2012  . Arthritis of neck ([place]) 08/29/2012  . Leg weakness 02/20/2012  . Adenocarcinoma of unknown primary ([place]) 02/06/2011  . Anxiety 05/19/2010  . Coronary atherosclerosis 12/03/2009  . GERD 12/03/2009    Past Surgical History:  Procedure Laterality Date  . ABDOMINAL HYSTERECTOMY    . CARDIAC CATHETERIZATION     coronary spasm  . CARDIAC CATHETERIZATION N/A 08/28/2014   Procedure: Left Heart Cath and Coronary Angiography;  Surgeon: Jettie Booze, MD;  Location: [facility];  Service: Cardiovascular;  Laterality: N/A;  . COLONOSCOPY    . DIAGNOSTIC LAPAROSCOPY    . LYSIS OF ADHESION    . PELVIC LAPAROSCOPY  1992   RSO, AND LSO ON 2007  . RESECTION SOFT TISSUE TUMOR LEG / ANKLE RADICAL  2004   leg lesion resection & radiation  . SALPINGOOPHORECTOMY Left   . TRANSTHORACIC ECHOCARDIOGRAM  07/2012   LV cavity size mildly reduced, normal wall motion; MV with calcified annulus and mild MR; LA mildly dilated; atrial septum with increased thickness -  lipomatous hypertrophy; RV systolic pressure increased (borderline pulm HTN)  . UPPER GASTROINTESTINAL ENDOSCOPY      OB History    Gravida Para Term Preterm AB Living   3 3       3    SAB TAB Ectopic Multiple Live Births           3       Home Medications    Prior to Admission medications   Medication Sig Start Date End Date Taking? Authorizing Provider  ALPRAZolam Duanne Moron) 0.5 MG tablet TAKE ONE TABLET BY MOUTH THREE TIMES DAILY 09/07/15  Yes Sharion Balloon, FNP  amitriptyline (ELAVIL) 10 MG tablet Take 1-3 tablets by mouth at bedtime as needed for sleep.  09/23/15  Yes Historical Provider, MD  aspirin 81 MG tablet Take 1 tablet (81  mg total) by mouth daily. Patient taking differently: Take 81 mg by mouth every morning.  02/16/12  Yes Daniel J Angiulli, PA-C  cetirizine (ZYRTEC) 10 MG tablet Take 1 tablet (10 mg total) by mouth 2 (two) times daily. Patient taking differently: Take 10 mg by mouth as needed for allergies.  08/18/13  Yes Lysbeth Penner, FNP  diphenoxylate-atropine (LOMOTIL) 2.5-0.025 MG tablet TAKE ONE TABLET BY MOUTH FOUR TIMES DAILY AS NEEDED Patient taking differently: Take 1 tablet by mouth 4 (four) times daily as needed for diarrhea or loose stools.  08/19/15  Yes Sharion Balloon, FNP  fluticasone (FLONASE) 50 MCG/ACT nasal spray Place 2 sprays into both nostrils daily. Patient taking differently: Place 2 sprays into both nostrils daily as needed for allergies.  10/29/14  Yes Tiffany A Gann, PA-C  hydrocortisone (ANUSOL-HC) 2.5 % rectal cream Apply rectally 2 times daily as needed Patient taking differently: Place 1 application rectally. Apply rectally 2 times daily as needed 07/09/15  Yes Sharion Balloon, FNP  isosorbide mononitrate (IMDUR) 30 MG 24 hr tablet TAKE 1/2 TABLET BY MOUTH DAILY. Patient taking differently: TAKE 1/2 TABLET (15 mg) BY MOUTH DAILY. 09/13/15  Yes Minus Breeding, MD  loperamide (IMODIUM) 2 MG capsule TAKE ONE CAPSULE BY MOUTH FOUR TIMES DAILY AS NEEDED FOR DIARRHEA / LOOSE STOOLS Patient taking differently: Take 2 mg by mouth 4 (four) times daily as needed for diarrhea or loose stools.  09/28/15  Yes [name], MD  NITROSTAT 0.4 MG SL tablet DISSOLVE ONE TABLET UNDER TONGUE EVERY 5 MINUTES UP TO 3 DOSES AS NEEDED FOR CHEST PAIN Patient taking differently: DISSOLVE ONE TABLET (0.4 mg) UNDER TONGUE EVERY 5 MINUTES UP TO 3 DOSES AS NEEDED FOR CHEST PAIN 01/04/15  Yes Pixie Casino, MD  pantoprazole (PROTONIX) 40 MG tablet Take 1 tablet (40 mg total) by mouth 2 (two) times daily. KEEP OV. 08/27/15  Yes Lorretta Harp, MD  traMADol (ULTRAM) 50 MG tablet TAKE ONE TABLET BY MOUTH EVERY  TWELVE HOURS AS NEEDED. Patient taking differently: TAKE ONE TABLET (50 mg) BY MOUTH EVERY TWELVE HOURS AS NEEDED for pain 08/09/15  Yes Sharion Balloon, FNP  triamterene-hydrochlorothiazide (DYAZIDE) 37.5-25 MG capsule Take 1 each (1 capsule total) by mouth daily. Patient taking differently: Take 1 capsule by mouth every Monday, Wednesday, and Friday.  11/10/14  Yes Jerline Pain, MD  verapamil (CALAN) 80 MG tablet TAKE ONE TABLET BY MOUTH FOUR TIMES DAILY Patient taking differently: TAKE ONE TABLET (80 mg) BY MOUTH FOUR TIMES DAILY 05/26/15  Yes Minus Breeding, MD  Vitamin D, Ergocalciferol, (DRISDOL) 50000 units CAPS capsule Take 50,000 Units by mouth every Monday.  09/13/15  Yes Historical Provider, MD    Family History Family History  Problem Relation Age of Onset  . Bladder Cancer Mother   . Heart disease Mother   . Hypertension Mother   . Heart disease Father   . Stroke Father   . Diabetes      Multiple family members on both sides   . Coronary artery disease      Multiple family members on both sides   . Colon cancer Neg Hx   . Esophageal cancer Neg Hx   . Pancreatic cancer Neg Hx   . Rectal cancer Neg Hx   . Stomach cancer Neg Hx     Social History Social History  Substance Use Topics  . Smoking status: Former Smoker    Packs/day: 0.50    Years: 4.00    Types: Cigarettes    Start date: 12/07/1968    Quit date: 11/20/1972  . Smokeless tobacco: Never Used     Comment: quit 40 years ago  . Alcohol use No     Allergies   Iodine; Cymbalta [duloxetine hcl]; Gabapentin; Ivp dye [iodinated diagnostic agents]; and Zanaflex [tizanidine hcl]   Review of Systems Review of Systems 10 Systems reviewed and are negative for acute change except as noted in the HPI.  Physical Exam Updated Vital Signs BP 130/76   Pulse 76   Temp 97.9 F (36.6 C) (Oral)   Resp 17   Ht 5\' 3"  (1.6 m)   Wt 160 lb (72.6 kg)   SpO2 99%   BMI 28.34 kg/m   Physical Exam  Constitutional: She is  oriented to person, place, and time. She appears well-developed and well-nourished. No distress.  HENT:  Head: Normocephalic and atraumatic.  Nose: Nose normal.  Mouth/Throat: Oropharynx is clear and moist.  Eyes: EOM are normal. Pupils are equal, round, and reactive to light.  Neck: Neck supple.  Cardiovascular: Normal rate, regular rhythm, normal heart sounds and intact distal pulses.   Pulmonary/Chest: Effort normal and breath sounds normal.  Abdominal: Soft. She exhibits no distension.  Mild left lower quadrant tenderness without guarding or rebound.  Musculoskeletal:  Internal rotation right lower extremity. Dorsalis pedis pulses are 2+ and symmetric. Pain with any range of motion of the right extremity. Upper extremities normal range of motion. Focal tenderness of the radius on the right wrist. No swelling or deformity.  Neurological: She is alert and oriented to person, place, and time. No cranial nerve deficit. She exhibits normal muscle tone. Coordination normal.  Skin: Skin is warm and dry.  Psychiatric: She has a normal mood and affect.     ED Treatments / Results  Labs (all labs ordered are listed, but only abnormal results are displayed) Labs Reviewed  BASIC METABOLIC PANEL - Abnormal; Notable for the following:       Result Value   Creatinine, Ser 1.01 (*)    GFR calc non Af Amer 54 (*)    All other components within normal limits  CBC WITH DIFFERENTIAL/PLATELET - Abnormal; Notable for the following:    RBC 3.63 (*)    Hemoglobin 10.8 (*)    HCT 34.3 (*)    Platelets 415 (*)    All other components within normal limits  PROTIME-INR  URINALYSIS, ROUTINE W REFLEX MICROSCOPIC (NOT AT [facility])  TYPE AND SCREEN    EKG  EKG Interpretation  Date/Time:  Sunday October 03 2015 16:59:05 EDT Ventricular Rate:  69 PR Interval:    QRS Duration: 97  QT Interval:  427 QTC Calculation: 458 R Axis:   27 Text Interpretation:  Sinus rhythm Abnormal R-wave progression, early  transition agree. no change from previous. Confirmed by Johnney Killian, MD, Jeannie Done [phone]) on 10/03/2015 6:33:13 PM       Radiology Dg Wrist Complete Right  Result Date: 10/03/2015 CLINICAL DATA:  Wrist pain after falling today.  Initial encounter. EXAM: RIGHT WRIST - COMPLETE 3+ VIEW COMPARISON:  None. FINDINGS: The mineralization and alignment are normal. There is no evidence of acute fracture or dislocation. There are minimal degenerative changes for age. There is an ulnar minus variance at the wrist. No focal soft tissue abnormalities are seen. IMPRESSION: No evidence of acute fracture or dislocation. Electronically Signed   By: Richardean Sale M.D.   On: 10/03/2015 17:04   Dg Knee Right Port  Result Date: 10/03/2015 CLINICAL DATA:  Femoral neck fracture. EXAM: PORTABLE RIGHT KNEE - 1-2 VIEW COMPARISON:  None. FINDINGS: No evidence of fracture, dislocation, or joint effusion. There are 3 compartment osteoarthritic changes of the right knee, worse at the patellofemoral compartment. Well corticated calcific density anterior to the distal femur may represent calcific tendinitis or intracapsular loose body. Soft tissues are otherwise unremarkable. IMPRESSION: No acute fracture or dislocation identified about the right Knee. Three compartment osteoarthritis, worse at the patellofemoral compartment. Electronically Signed   By: Fidela Salisbury M.D.   On: 10/03/2015 18:45   Dg Hip Unilat With Pelvis 2-3 Views Right  Result Date: 10/03/2015 CLINICAL DATA:  Right hip pain status post fall. EXAM: DG HIP (WITH OR WITHOUT PELVIS) 2-3V RIGHT COMPARISON:  None. FINDINGS: There is an impacted subcapital right femoral neck fracture with minimal medial displacement of the distal fracture fragment. No other fractures are seen. Soft tissues are grossly normal. IMPRESSION: Impacted subcapital right femoral neck fracture. Electronically Signed   By: Fidela Salisbury M.D.   On: 10/03/2015 17:03     Procedures Procedures (including critical care time)  Medications Ordered in ED Medications  oxyCODONE-acetaminophen (PERCOCET/ROXICET) 5-325 MG per tablet (not administered)  0.9 %  sodium chloride infusion ( Intravenous New Bag/Given 10/03/15 1758)  morphine 2 MG/ML injection 0.5 mg (not administered)  oxyCODONE-acetaminophen (PERCOCET/ROXICET) 5-325 MG per tablet 1 tablet (1 tablet Oral Given 10/03/15 1533)  morphine 4 MG/ML injection 4 mg (4 mg Intravenous Given 10/03/15 1813)     Initial Impression / Assessment and Plan / ED Course  I have reviewed the triage vital signs and the nursing notes.  Pertinent labs & imaging results that were available during my care of the patient were reviewed by me and considered in my medical decision making (see chart for details).  Clinical Course   Consult: (17:40) reviewed Dr. Rise Patience tech. Requests nothing by mouth after midnight for planned a.m. hip repair. Requests admission to medical service. Consult:Dr.Ghimire for hospitalist admission. Final Clinical Impressions(s) / ED Diagnoses   Final diagnoses:  Closed right hip fracture, initial encounter ([place])  Wrist sprain, right, initial encounter   Patient has had mechanical fall with right hip fracture. She is alert and nontoxic. There does not appear to be any other significant traumatic injury associated with this. Patient has stable baseline medical conditions and is in good physical condition. New Prescriptions New Prescriptions   No medications on file     Charlesetta Shanks, MD 10/03/15 [phone]

## 2015-10-03 NOTE — ED Triage Notes (Signed)
Pt. Stated, I tripped over the bedspread and I hurt my rt. Hip and in my groin area on the rt. Side.

## 2015-10-04 ENCOUNTER — Inpatient Hospital Stay (HOSPITAL_COMMUNITY): Payer: Medicare Other

## 2015-10-04 ENCOUNTER — Inpatient Hospital Stay (HOSPITAL_COMMUNITY): Payer: Medicare Other | Admitting: Anesthesiology

## 2015-10-04 ENCOUNTER — Encounter (HOSPITAL_COMMUNITY)
Admission: EM | Disposition: A | Discharge status: Home Health | Payer: Self-pay | Source: Home / Self Care | Attending: Internal Medicine

## 2015-10-04 ENCOUNTER — Encounter (HOSPITAL_COMMUNITY): Payer: Self-pay | Admitting: General Practice

## 2015-10-04 DIAGNOSIS — S72001A Fracture of unspecified part of neck of right femur, initial encounter for closed fracture: Secondary | ICD-10-CM | POA: Diagnosis present

## 2015-10-04 HISTORY — DX: Fracture of unspecified part of neck of right femur, initial encounter for closed fracture: S72.001A

## 2015-10-04 HISTORY — PX: TOTAL HIP ARTHROPLASTY: SHX124

## 2015-10-04 LAB — CBC
HCT: 32.9 % — ABNORMAL LOW (ref 36.0–46.0)
Hemoglobin: 10.3 g/dL — ABNORMAL LOW (ref 12.0–15.0)
MCH: 29.9 pg (ref 26.0–34.0)
MCHC: 31.3 g/dL (ref 30.0–36.0)
MCV: 95.4 fL (ref 78.0–100.0)
Platelets: 383 10*3/uL (ref 150–400)
RBC: 3.45 MIL/uL — ABNORMAL LOW (ref 3.87–5.11)
RDW: 13.5 % (ref 11.5–15.5)
WBC: 5.2 10*3/uL (ref 4.0–10.5)

## 2015-10-04 LAB — BASIC METABOLIC PANEL WITH GFR
Anion gap: 8 (ref 5–15)
BUN: 9 mg/dL (ref 6–20)
CO2: 26 mmol/L (ref 22–32)
Calcium: 9.1 mg/dL (ref 8.9–10.3)
Chloride: 105 mmol/L (ref 101–111)
Creatinine, Ser: 0.99 mg/dL (ref 0.44–1.00)
GFR calc Af Amer: >60 mL/min
GFR calc non Af Amer: 56 mL/min — ABNORMAL LOW
Glucose, Bld: 96 mg/dL (ref 65–99)
Potassium: 4.1 mmol/L (ref 3.5–5.1)
Sodium: 139 mmol/L (ref 135–145)

## 2015-10-04 LAB — SURGICAL PCR SCREEN
MRSA, PCR: POSITIVE — AB
Staphylococcus aureus: POSITIVE — AB

## 2015-10-04 SURGERY — ARTHROPLASTY, HIP, TOTAL, ANTERIOR APPROACH
Anesthesia: Spinal | Laterality: Right

## 2015-10-04 MED ORDER — VANCOMYCIN HCL IN DEXTROSE 1-5 GM/200ML-% IV SOLN
1000.0000 mg | Freq: Once | INTRAVENOUS | Status: AC
  Administered 2015-10-04: 1000 mg via INTRAVENOUS

## 2015-10-04 MED ORDER — ONDANSETRON HCL 4 MG/2ML IJ SOLN: 4.0000 mg | Freq: Four times a day (QID) | INTRAMUSCULAR | Status: DC | PRN

## 2015-10-04 MED ORDER — PROPOFOL 1000 MG/100ML IV EMUL
INTRAVENOUS | Status: AC
  Filled 2015-10-04: qty 200

## 2015-10-04 MED ORDER — ONDANSETRON HCL 4 MG/2ML IJ SOLN
INTRAMUSCULAR | Status: AC
  Filled 2015-10-04: qty 2

## 2015-10-04 MED ORDER — VANCOMYCIN HCL IN DEXTROSE 1-5 GM/200ML-% IV SOLN
INTRAVENOUS | Status: AC
  Filled 2015-10-04: qty 200

## 2015-10-04 MED ORDER — FENTANYL CITRATE (PF) 100 MCG/2ML IJ SOLN
INTRAMUSCULAR | Status: DC | PRN
  Administered 2015-10-04 (×2): 50 ug via INTRAVENOUS

## 2015-10-04 MED ORDER — MENTHOL 3 MG MT LOZG: 1.0000 | LOZENGE | OROMUCOSAL | Status: DC | PRN

## 2015-10-04 MED ORDER — BUPIVACAINE-EPINEPHRINE (PF) 0.5% -1:200000 IJ SOLN
INTRAMUSCULAR | Status: DC | PRN
  Administered 2015-10-04: 30 mL via PERINEURAL

## 2015-10-04 MED ORDER — PROPOFOL 10 MG/ML IV BOLUS
INTRAVENOUS | Status: DC | PRN
  Administered 2015-10-04: 10 mg via INTRAVENOUS

## 2015-10-04 MED ORDER — MIDAZOLAM HCL 2 MG/2ML IJ SOLN
INTRAMUSCULAR | Status: AC
  Filled 2015-10-04: qty 2

## 2015-10-04 MED ORDER — BUPIVACAINE-EPINEPHRINE (PF) 0.5% -1:200000 IJ SOLN
INTRAMUSCULAR | Status: AC
  Filled 2015-10-04: qty 30

## 2015-10-04 MED ORDER — FENTANYL CITRATE (PF) 100 MCG/2ML IJ SOLN
INTRAMUSCULAR | Status: AC
  Filled 2015-10-04: qty 2

## 2015-10-04 MED ORDER — ASPIRIN EC 81 MG PO TBEC
81.0000 mg | DELAYED_RELEASE_TABLET | Freq: Two times a day (BID) | ORAL | Status: DC
  Administered 2015-10-05 – 2015-10-06 (×3): 81 mg via ORAL
  Filled 2015-10-04 (×3): qty 1

## 2015-10-04 MED ORDER — BUPIVACAINE HCL (PF) 0.25 % IJ SOLN
INTRAMUSCULAR | Status: AC
  Filled 2015-10-04: qty 30

## 2015-10-04 MED ORDER — ACETAMINOPHEN 325 MG PO TABS: 650.0000 mg | ORAL_TABLET | Freq: Four times a day (QID) | ORAL | Status: DC | PRN

## 2015-10-04 MED ORDER — METOCLOPRAMIDE HCL 5 MG/ML IJ SOLN: 5.0000 mg | Freq: Three times a day (TID) | INTRAMUSCULAR | Status: DC | PRN

## 2015-10-04 MED ORDER — VANCOMYCIN HCL IN DEXTROSE 1-5 GM/200ML-% IV SOLN
1000.0000 mg | Freq: Two times a day (BID) | INTRAVENOUS | Status: AC
  Administered 2015-10-04: 1000 mg via INTRAVENOUS
  Filled 2015-10-04: qty 200

## 2015-10-04 MED ORDER — SODIUM CHLORIDE 0.9 % IR SOLN
Status: DC | PRN
  Administered 2015-10-04: 3000 mL

## 2015-10-04 MED ORDER — SENNA 8.6 MG PO TABS
1.0000 | ORAL_TABLET | Freq: Two times a day (BID) | ORAL | Status: DC
  Administered 2015-10-04 – 2015-10-06 (×4): 8.6 mg via ORAL
  Filled 2015-10-04 (×4): qty 1

## 2015-10-04 MED ORDER — METHOCARBAMOL 500 MG PO TABS
500.0000 mg | ORAL_TABLET | Freq: Four times a day (QID) | ORAL | Status: DC | PRN
  Administered 2015-10-05 (×2): 500 mg via ORAL
  Filled 2015-10-04 (×2): qty 1

## 2015-10-04 MED ORDER — KETOROLAC TROMETHAMINE 30 MG/ML IJ SOLN
INTRAMUSCULAR | Status: AC
  Filled 2015-10-04: qty 1

## 2015-10-04 MED ORDER — KETOROLAC TROMETHAMINE 30 MG/ML IJ SOLN
INTRAMUSCULAR | Status: DC | PRN
  Administered 2015-10-04: 30 mg via INTRAVENOUS

## 2015-10-04 MED ORDER — LACTATED RINGERS IV SOLN
INTRAVENOUS | Status: DC
  Administered 2015-10-04 (×2): via INTRAVENOUS

## 2015-10-04 MED ORDER — ACETAMINOPHEN 650 MG RE SUPP
650.0000 mg | Freq: Four times a day (QID) | RECTAL | Status: DC | PRN
Start: 2015-10-04 — End: 2015-10-06

## 2015-10-04 MED ORDER — ONDANSETRON HCL 4 MG/2ML IJ SOLN
INTRAMUSCULAR | Status: DC | PRN
  Administered 2015-10-04: 4 mg via INTRAVENOUS

## 2015-10-04 MED ORDER — DOCUSATE SODIUM 100 MG PO CAPS
100.0000 mg | ORAL_CAPSULE | Freq: Two times a day (BID) | ORAL | Status: DC
  Administered 2015-10-04 – 2015-10-06 (×4): 100 mg via ORAL
  Filled 2015-10-04 (×4): qty 1

## 2015-10-04 MED ORDER — PROPOFOL 500 MG/50ML IV EMUL
INTRAVENOUS | Status: DC | PRN
  Administered 2015-10-04: 50 ug/kg/min via INTRAVENOUS

## 2015-10-04 MED ORDER — DEXTROSE 5 % IV SOLN
500.0000 mg | Freq: Four times a day (QID) | INTRAVENOUS | Status: DC | PRN
  Filled 2015-10-04: qty 5

## 2015-10-04 MED ORDER — 0.9 % SODIUM CHLORIDE (POUR BTL) OPTIME
TOPICAL | Status: DC | PRN
  Administered 2015-10-04: 1000 mL

## 2015-10-04 MED ORDER — MIDAZOLAM HCL 5 MG/5ML IJ SOLN
INTRAMUSCULAR | Status: DC | PRN
  Administered 2015-10-04 (×2): 1 mg via INTRAVENOUS

## 2015-10-04 MED ORDER — ONDANSETRON HCL 4 MG PO TABS: 4.0000 mg | ORAL_TABLET | Freq: Four times a day (QID) | ORAL | Status: DC | PRN

## 2015-10-04 MED ORDER — HYDROCODONE-ACETAMINOPHEN 5-325 MG PO TABS
1.0000 | ORAL_TABLET | Freq: Four times a day (QID) | ORAL | Status: DC | PRN
  Administered 2015-10-04 – 2015-10-06 (×4): 2 via ORAL
  Filled 2015-10-04 (×4): qty 2

## 2015-10-04 MED ORDER — SODIUM CHLORIDE 0.9 % IJ SOLN
INTRAMUSCULAR | Status: DC | PRN
  Administered 2015-10-04: 30 mL via INTRAVENOUS

## 2015-10-04 MED ORDER — PHENOL 1.4 % MT LIQD: 1.0000 | OROMUCOSAL | Status: DC | PRN

## 2015-10-04 MED ORDER — METOCLOPRAMIDE HCL 5 MG PO TABS: 5.0000 mg | ORAL_TABLET | Freq: Three times a day (TID) | ORAL | Status: DC | PRN

## 2015-10-04 SURGICAL SUPPLY — 54 items
ALCOHOL ISOPROPYL (RUBBING) (MISCELLANEOUS) ×2 IMPLANT
BLADE SURG ROTATE 9660 (MISCELLANEOUS) IMPLANT
CAPT HIP TOTAL 2 ×2 IMPLANT
CHLORAPREP W/TINT 26ML (MISCELLANEOUS) ×2 IMPLANT
COVER SURGICAL LIGHT HANDLE (MISCELLANEOUS) ×2 IMPLANT
DERMABOND ADVANCED (GAUZE/BANDAGES/DRESSINGS) ×2
DERMABOND ADVANCED .7 DNX12 (GAUZE/BANDAGES/DRESSINGS) ×2 IMPLANT
DRAPE C-ARM 42X72 X-RAY (DRAPES) ×2 IMPLANT
DRAPE IMP U-DRAPE 54X76 (DRAPES) ×4 IMPLANT
DRAPE STERI IOBAN 125X83 (DRAPES) ×2 IMPLANT
DRAPE U-SHAPE 47X51 STRL (DRAPES) ×6 IMPLANT
DRSG AQUACEL AG ADV 3.5X10 (GAUZE/BANDAGES/DRESSINGS) ×2 IMPLANT
ELECT BLADE 4.0 EZ CLEAN MEGAD (MISCELLANEOUS) ×2
ELECT REM PT RETURN 9FT ADLT (ELECTROSURGICAL) ×2
ELECTRODE BLDE 4.0 EZ CLN MEGD (MISCELLANEOUS) ×1 IMPLANT
ELECTRODE REM PT RTRN 9FT ADLT (ELECTROSURGICAL) ×1 IMPLANT
EVACUATOR 1/8 PVC DRAIN (DRAIN) IMPLANT
GLOVE BIO SURGEON STRL SZ8.5 (GLOVE) ×4 IMPLANT
GLOVE BIOGEL PI IND STRL 8.5 (GLOVE) ×1 IMPLANT
GLOVE BIOGEL PI INDICATOR 8.5 (GLOVE) ×1
GOWN STRL REUS W/ TWL LRG LVL3 (GOWN DISPOSABLE) ×2 IMPLANT
GOWN STRL REUS W/TWL 2XL LVL3 (GOWN DISPOSABLE) ×2 IMPLANT
GOWN STRL REUS W/TWL LRG LVL3 (GOWN DISPOSABLE) ×2
HANDPIECE INTERPULSE COAX TIP (DISPOSABLE) ×1
HOOD PEEL AWAY FACE SHEILD DIS (HOOD) ×4 IMPLANT
KIT BASIN OR (CUSTOM PROCEDURE TRAY) ×2 IMPLANT
KIT ROOM TURNOVER OR (KITS) ×2 IMPLANT
MANIFOLD NEPTUNE II (INSTRUMENTS) ×2 IMPLANT
MARKER SKIN DUAL TIP RULER LAB (MISCELLANEOUS) ×4 IMPLANT
NEEDLE SPNL 18GX3.5 QUINCKE PK (NEEDLE) ×2 IMPLANT
NS IRRIG 1000ML POUR BTL (IV SOLUTION) ×2 IMPLANT
PACK TOTAL JOINT (CUSTOM PROCEDURE TRAY) ×2 IMPLANT
PACK UNIVERSAL I (CUSTOM PROCEDURE TRAY) ×2 IMPLANT
PAD ARMBOARD 7.5X6 YLW CONV (MISCELLANEOUS) ×4 IMPLANT
SAW OSC TIP CART 19.5X105X1.3 (SAW) ×2 IMPLANT
SEALER BIPOLAR AQUA 6.0 (INSTRUMENTS) IMPLANT
SET HNDPC FAN SPRY TIP SCT (DISPOSABLE) ×1 IMPLANT
SOLUTION BETADINE 4OZ (MISCELLANEOUS) ×2 IMPLANT
SUCTION FRAZIER HANDLE 10FR (MISCELLANEOUS) ×1
SUCTION TUBE FRAZIER 10FR DISP (MISCELLANEOUS) ×1 IMPLANT
SUT ETHIBOND NAB CT1 #1 30IN (SUTURE) ×4 IMPLANT
SUT MNCRL AB 3-0 PS2 18 (SUTURE) ×2 IMPLANT
SUT MON AB 2-0 CT1 36 (SUTURE) ×4 IMPLANT
SUT VIC AB 1 CT1 27 (SUTURE) ×2
SUT VIC AB 1 CT1 27XBRD ANBCTR (SUTURE) ×2 IMPLANT
SUT VIC AB 2-0 CT1 27 (SUTURE) ×2
SUT VIC AB 2-0 CT1 TAPERPNT 27 (SUTURE) ×2 IMPLANT
SUT VLOC 180 0 24IN GS25 (SUTURE) ×2 IMPLANT
SYR 50ML LL SCALE MARK (SYRINGE) ×2 IMPLANT
SYRINGE 60CC LL (MISCELLANEOUS) ×2 IMPLANT
TOWEL OR 17X24 6PK STRL BLUE (TOWEL DISPOSABLE) ×2 IMPLANT
TOWEL OR 17X26 10 PK STRL BLUE (TOWEL DISPOSABLE) ×2 IMPLANT
TRAY FOLEY CATH 16FR SILVER (SET/KITS/TRAYS/PACK) IMPLANT
WATER STERILE IRR 1000ML POUR (IV SOLUTION) ×6 IMPLANT

## 2015-10-04 NOTE — Progress Notes (Signed)
PROGRESS NOTE  Amy Black  [id number] DOB: 22-Jan-1941 DOA: 10/03/2015 PCP: [name], MD  Brief Narrative:  Amy Black is a 75 y.o. female with medical history significant of hypertension, anxiety, GERD, irritable bowel syndrome, history of adenocarcinoma of unknown primary-brought to the ED for evaluation after sustaining a mechanical fall. Per patient, she sustained a mechanical fall after she was attempting to get out of bed.  x-ray of the right hip showed a subcapital fracture.  She underwent right total hip arthroplasty on 10/04/2015.    Assessment & Plan:   Active Problems:   Coronary atherosclerosis   GERD   Anxiety   Adenocarcinoma of unknown primary ([place])   HTN (hypertension)   Closed right hip fracture (HCC)   Femoral neck fracture, right, closed, initial encounter  Closed right hip fracture secondary to mechanical fall s/p right total hip arthroplasty on 10/04/2015 -  Pain control -  Up with PT tomorrow -  DVT proph per orthopedics  Hypertension, blood pressure stable, continue verapamil  GERD, stable, continue PPI  Nonobstructive CAD, chest pain free.  Continue aspirin, verapamil.  Will discuss adding statin with patient.  No on beta blocker.    Adenocarcinoma of unknown primary in remission.  Followed by oncology.  Anxiety, stable, continue xanax  IBS, stable.  Monitor for constipation postoperatively.     DVT prophylaxis:  SCDs Code Status:  full Family Communication:  Patient alone Disposition Plan:  Likely to SNF in 2 days    Consultants:   Dr. Lyla Glassing, Orthopedic surgery   Procedures:  Right total hip arthroplasty  Antimicrobials:   perioperative    Subjective: Pain in the right leg when she tries to move it, otherwise feels okay.  Denies SOB, chest pain, nausea, vomiting.  Objective: Vitals:   10/04/15 1433 10/04/15 1440 10/04/15 1450 10/04/15 1509  BP: 137/69 (!) 142/67 (!) 145/62 (!) 142/68  Pulse: 67 63 64 66    Resp: 15 14 13 14   Temp:   97.2 F (36.2 C) 97.2 F (36.2 C)  TempSrc:    Oral  SpO2: 100% 96% 100% 99%  Weight:      Height:        Intake/Output Summary (Last 24 hours) at 10/04/15 1839 Last data filed at 10/04/15 1737  Gross per 24 hour  Intake             2720 ml  Output             1377 ml  Net             1343 ml   Filed Weights   10/03/15 1524  Weight: 72.6 kg (160 lb)    Examination:  General exam:  Adult female. Seen preoperatively. No acute distress.  HEENT:  NCAT, MMM Respiratory system: Clear to auscultation bilaterally Cardiovascular system: Regular rate and rhythm, normal 99991111. 2/6 systolic murmur.  Warm extremities Gastrointestinal system: Normal active bowel sounds, soft, nondistended, nontender. MSK:  Normal tone and bulk, Swelling along the right lateral hip, TTP, no bruising.  2+ pedal pulse Neuro:  Grossly intact.  Sensation intact to light touch right foot.  Able to wiggle toes on right foot.    Data Reviewed: I have personally reviewed following labs and imaging studies  CBC:  Recent Labs Lab 10/03/15 1740 10/04/15 0305  WBC 9.0 5.2  NEUTROABS 6.2  --   HGB 10.8* 10.3*  HCT 34.3* 32.9*  MCV 94.5 95.4  PLT 415* 383   Basic  Metabolic Panel:  Recent Labs Lab 10/03/15 1740 10/04/15 0305  NA 136 139  K 4.5 4.1  CL 101 105  CO2 27 26  GLUCOSE 97 96  BUN 11 9  CREATININE 1.01* 0.99  CALCIUM 9.6 9.1   GFR: Estimated Creatinine Clearance: 49.1 mL/min (by C-G formula based on SCr of 0.99 mg/dL). Liver Function Tests: No results for input(s): AST, ALT, ALKPHOS, BILITOT, PROT, ALBUMIN in the last 168 hours. No results for input(s): LIPASE, AMYLASE in the last 168 hours. No results for input(s): AMMONIA in the last 168 hours. Coagulation Profile:  Recent Labs Lab 10/03/15 1740  INR 1.13   Cardiac Enzymes: No results for input(s): CKTOTAL, CKMB, CKMBINDEX, TROPONINI in the last 168 hours. BNP (last 3 results) No results for  input(s): PROBNP in the last 8760 hours. HbA1C: No results for input(s): HGBA1C in the last 72 hours. CBG: No results for input(s): GLUCAP in the last 168 hours. Lipid Profile: No results for input(s): CHOL, HDL, LDLCALC, TRIG, CHOLHDL, LDLDIRECT in the last 72 hours. Thyroid Function Tests: No results for input(s): TSH, T4TOTAL, FREET4, T3FREE, THYROIDAB in the last 72 hours. Anemia Panel: No results for input(s): VITAMINB12, FOLATE, FERRITIN, TIBC, IRON, RETICCTPCT in the last 72 hours. Urine analysis:    Component Value Date/Time   COLORURINE YELLOW 10/03/2015 1852   APPEARANCEUR CLEAR 10/03/2015 1852   LABSPEC 1.009 10/03/2015 1852   PHURINE 6.0 10/03/2015 1852   GLUCOSEU NEGATIVE 10/03/2015 1852   HGBUR NEGATIVE 10/03/2015 1852   BILIRUBINUR NEGATIVE 10/03/2015 1852   BILIRUBINUR small 11/16/2014 1136   KETONESUR NEGATIVE 10/03/2015 1852   PROTEINUR NEGATIVE 10/03/2015 1852   UROBILINOGEN negative 11/16/2014 1136   UROBILINOGEN 0.2 06/12/2013 0352   NITRITE NEGATIVE 10/03/2015 1852   LEUKOCYTESUR TRACE (A) 10/03/2015 1852   Sepsis Labs: @LABRCNTIP (procalcitonin:4,lacticidven:4)  ) Recent Results (from the past 240 hour(s))  Surgical pcr screen     Status: Abnormal   Collection Time: 10/04/15  5:11 AM  Result Value Ref Range Status   MRSA, PCR POSITIVE (A) NEGATIVE Final   Staphylococcus aureus POSITIVE (A) NEGATIVE Final    Comment:        The Xpert SA Assay (FDA approved for NASAL specimens in patients over 58 years of age), is one component of a comprehensive surveillance program.  Test performance has been validated by [facility] for patients greater than or equal to 63 year old. It is not intended to diagnose infection nor to guide or monitor treatment. RESULT CALLED TO, READ BACK BY AND VERIFIED WITH: R LIGHT,RN AT 0806 10/04/15 BY L BENFIELD       Radiology Studies: Dg Wrist Complete Right  Result Date: 10/03/2015 CLINICAL DATA:  Wrist pain after  falling today.  Initial encounter. EXAM: RIGHT WRIST - COMPLETE 3+ VIEW COMPARISON:  None. FINDINGS: The mineralization and alignment are normal. There is no evidence of acute fracture or dislocation. There are minimal degenerative changes for age. There is an ulnar minus variance at the wrist. No focal soft tissue abnormalities are seen. IMPRESSION: No evidence of acute fracture or dislocation. Electronically Signed   By: Richardean Sale M.D.   On: 10/03/2015 17:04   Ct Wrist Right Wo Contrast  Result Date: 10/03/2015 CLINICAL DATA:  Patient fell and caught herself in the right wrist. Pain in the distal radius with possible fracture. EXAM: CT OF THE RIGHT WRIST WITHOUT CONTRAST TECHNIQUE: Multidetector CT imaging of the right wrist was performed according to the standard protocol. Multiplanar CT  image reconstructions were also generated. COMPARISON:  Right wrist radiographs 10/03/2015 FINDINGS: Bones/Joint/Cartilage Carpal bones, visualized metacarpal bones, and visualized distal radius and ulna appear intact. No acute fractures are demonstrated. MRI would be more sensitive for detection of bone bruises or other bony injury if clinically indicated. Tiny amount of gas demonstrated in the lunate toe capitate joint, likely degenerative. No focal bone lesion or bone destruction. Ligaments Suboptimally assessed by CT. Muscles and Tendons No intramuscular hematoma or fluid collection demonstrated. Soft tissues No significant soft tissue infiltration or swelling. IMPRESSION: No acute fracture or dislocation demonstrated in the wrist. Mild degenerative changes are present. Electronically Signed   By: Lucienne Capers M.D.   On: 10/03/2015 21:22   Pelvis Portable  Result Date: 10/04/2015 CLINICAL DATA:  Status post right hip arthroplasty. EXAM: PORTABLE PELVIS 1-2 VIEWS COMPARISON:  Same day. FINDINGS: The femoral and acetabular components appear to be well situated. No fracture or dislocation is noted. Expected  postoperative changes are seen in the surrounding soft tissues. IMPRESSION: Status post right total hip arthroplasty. Electronically Signed   By: Marijo Conception, M.D.   On: 10/04/2015 15:49   Dg Knee Right Port  Result Date: 10/03/2015 CLINICAL DATA:  Femoral neck fracture. EXAM: PORTABLE RIGHT KNEE - 1-2 VIEW COMPARISON:  None. FINDINGS: No evidence of fracture, dislocation, or joint effusion. There are 3 compartment osteoarthritic changes of the right knee, worse at the patellofemoral compartment. Well corticated calcific density anterior to the distal femur may represent calcific tendinitis or intracapsular loose body. Soft tissues are otherwise unremarkable. IMPRESSION: No acute fracture or dislocation identified about the right Knee. Three compartment osteoarthritis, worse at the patellofemoral compartment. Electronically Signed   By: Fidela Salisbury M.D.   On: 10/03/2015 18:45   Dg C-arm 1-60 Min  Result Date: 10/04/2015 CLINICAL DATA:  Right follow-up arthroplasty, anterior approach. EXAM: DG C-ARM 61-120 MIN; OPERATIVE RIGHT HIP WITH PELVIS COMPARISON:  10/03/2015. FINDINGS: Three intraoperative fluoroscopic spot views of the right hip initially show a reamer in the proximal right femur with the acetabular cup in place. Subsequent images show right hip arthroplasty in anatomic position. IMPRESSION: Intraoperative visualization of a right hip arthroplasty. Electronically Signed   By: Lorin Picket M.D.   On: 10/04/2015 14:14   Dg Hip Operative Unilat W Or W/o Pelvis Right  Result Date: 10/04/2015 CLINICAL DATA:  Right follow-up arthroplasty, anterior approach. EXAM: DG C-ARM 61-120 MIN; OPERATIVE RIGHT HIP WITH PELVIS COMPARISON:  10/03/2015. FINDINGS: Three intraoperative fluoroscopic spot views of the right hip initially show a reamer in the proximal right femur with the acetabular cup in place. Subsequent images show right hip arthroplasty in anatomic position. IMPRESSION: Intraoperative  visualization of a right hip arthroplasty. Electronically Signed   By: Lorin Picket M.D.   On: 10/04/2015 14:14   Dg Hip Unilat With Pelvis 2-3 Views Right  Result Date: 10/03/2015 CLINICAL DATA:  Right hip pain status post fall. EXAM: DG HIP (WITH OR WITHOUT PELVIS) 2-3V RIGHT COMPARISON:  None. FINDINGS: There is an impacted subcapital right femoral neck fracture with minimal medial displacement of the distal fracture fragment. No other fractures are seen. Soft tissues are grossly normal. IMPRESSION: Impacted subcapital right femoral neck fracture. Electronically Signed   By: Fidela Salisbury M.D.   On: 10/03/2015 17:03     Scheduled Meds: . ALPRAZolam  0.5 mg Oral TID  . [START ON 10/05/2015] aspirin EC  81 mg Oral BID WC  . docusate sodium  100 mg Oral  BID  . isosorbide mononitrate  15 mg Oral Daily  . loratadine  10 mg Oral Daily  . pantoprazole  40 mg Oral BID  . senna  1 tablet Oral BID  . vancomycin      . vancomycin  1,000 mg Intravenous Q12H  . verapamil  80 mg Oral QID   Continuous Infusions: . sodium chloride 75 mL/hr at 10/03/15 2101     LOS: 1 day    Time spent: 30 min    Janece Canterbury, MD [facility] Pager [phone]  If 7PM-7AM, please contact night-coverage [email or web address] Password [facility] 10/04/2015, 6:39 PM

## 2015-10-04 NOTE — Anesthesia Postprocedure Evaluation (Signed)
Anesthesia Post Note  Patient: Lakyia Furmanski Merkley  Procedure(s) Performed: Procedure(s) (LRB): TOTAL HIP ARTHROPLASTY ANTERIOR APPROACH (Right)  Patient location during evaluation: PACU Anesthesia Type: Spinal Level of consciousness: oriented and awake and alert Pain management: pain level controlled Vital Signs Assessment: post-procedure vital signs reviewed and stable Respiratory status: spontaneous breathing, respiratory function stable and patient connected to nasal cannula oxygen Cardiovascular status: blood pressure returned to baseline and stable Postop Assessment: no headache and no backache Anesthetic complications: no    Last Vitals:  Vitals:   10/04/15 1450 10/04/15 1509  BP: (!) 145/62 (!) 142/68  Pulse: 64 66  Resp: 13 14  Temp: 36.2 C 36.2 C    Last Pain:  Vitals:   10/04/15 1509  TempSrc: Oral  PainSc:                  Keeon Zurn DAVID

## 2015-10-04 NOTE — Anesthesia Procedure Notes (Signed)
Procedure Name: MAC Date/Time: 10/04/2015 11:45 AM Performed by: Jenne Campus Pre-anesthesia Checklist: Patient identified, Emergency Drugs available, Suction available, Patient being monitored and Timeout performed Patient Re-evaluated:Patient Re-evaluated prior to inductionOxygen Delivery Method: Simple face mask

## 2015-10-04 NOTE — Anesthesia Preprocedure Evaluation (Addendum)
Anesthesia Evaluation  Patient identified by MRN, date of birth, ID band Patient awake    Reviewed: Allergy & Precautions, NPO status , Patient's Chart, lab work & pertinent test results  Airway Mallampati: I  TM Distance: >3 FB Neck ROM: Full    Dental   Pulmonary former smoker,    Pulmonary exam normal        Cardiovascular hypertension, + CAD  Normal cardiovascular exam  2016 Normal cath with subclinical blockages   Neuro/Psych    GI/Hepatic GERD  Medicated and Controlled,  Endo/Other    Renal/GU      Musculoskeletal   Abdominal   Peds  Hematology   Anesthesia Other Findings   Reproductive/Obstetrics                            Anesthesia Physical Anesthesia Plan  ASA: III  Anesthesia Plan: Spinal   Post-op Pain Management:    Induction: Intravenous  Airway Management Planned: Simple Face Mask  Additional Equipment:   Intra-op Plan:   Post-operative Plan:   Informed Consent: I have reviewed the patients History and Physical, chart, labs and discussed the procedure including the risks, benefits and alternatives for the proposed anesthesia with the patient or authorized representative who has indicated his/her understanding and acceptance.     Plan Discussed with: CRNA and Surgeon  Anesthesia Plan Comments:         Anesthesia Quick Evaluation

## 2015-10-04 NOTE — Progress Notes (Signed)
SW attempted to meet with patient at bedside to conduct assessment. However, the pt was not present. SW consulted with nurse who states that the pt is currently in surgery. SW spoke with husband over phone.  Tilda Burrow, MSW [phone]

## 2015-10-04 NOTE — Clinical Social Work Note (Signed)
Clinical Social Work Assessment  Patient Details  Name: Amy Black MRN: [id number] Date of Birth: November 11, 1940  Date of referral:  10/04/15               Reason for consult:  Facility Placement                Permission sought to share information with:   (Facilities) Permission granted to share information::   (Facilities)  Name::        Agency::     Relationship::     Contact Information:     Housing/Transportation Living arrangements for the past 2 months:  Single Family Home (Home with husband and daughter) Source of Information:  Spouse Patient Interpreter Needed:  None Criminal Activity/Legal Involvement Pertinent to Current Situation/Hospitalization:  No - Comment as needed Significant Relationships:  Spouse Lives with:  Adult Children, Spouse Do you feel safe going back to the place where you live?   (Husband is interested in facility for patient.) Need for family participation in patient care:  Yes (Comment) (Husband is supportive.)  Care giving concerns:  Husband states that pt was independent prior to coming to hospital. He sates that pt did not have any home health. However, he says no he believes pt will need assistance with completing ADL's.   Social Worker assessment / plan:  SW attempted to meet with pt at bedside. However, she was not present. SW consulted with nurse who states that pt is currently in surgery. SW spoke with husband who states that if pt needs he they would consider rehab. SW will refer pt to facilities.  Employment status:  Retired Forensic scientist:   Education officer, environmental.) PT Recommendations:  [place] / Referral to community resources:   (Information regarding facilities)  Patient/Family's Response to care: Appropriate and accepting.   Patient/Family's Understanding of and Emotional Response to Diagnosis, Current Treatment, and Prognosis:  No questions for SW at this time.  Emotional Assessment Appearance:    (Unable to assess) Attitude/Demeanor/Rapport:    Affect (typically observed):   (Unable to assess due to being in surgery) Orientation:   (Unable to assess ) Alcohol / Substance use:  Not Applicable Psych involvement (Current and /or in the community):  No (Comment)  Discharge Needs  Concerns to be addressed:  No discharge needs identified Readmission within the last 30 days:  Yes Current discharge risk:  None Barriers to Discharge:  No Barriers Identified   Amy Black 10/04/2015, 3:33 PM

## 2015-10-04 NOTE — Op Note (Signed)
OPERATIVE REPORT  SURGEON: Rod Can, MD.   ASSISTANT: Ky Barban, RNFA.  PREOPERATIVE DIAGNOSIS: Displaced Right femoral neck fracture.   POSTOPERATIVE DIAGNOSIS: Displaced Right femoral neck fracture.   PROCEDURE: Right total hip arthroplasty, anterior approach.   IMPLANTS: DePuy Tri Lock stem, size 3, std offset. DePuy Pinnacle Cup, size 50 mm. DePuy Altrx liner, size 32 by 50 mm, +4 neutral. DePuy Biolox ceramic head ball, size 32 + 1 mm. Cancellous bone screw 6.5 x 30 mm.  ANESTHESIA:  Spinal  ESTIMATED BLOOD LOSS: 350 mL.  ANTIBIOTICS: 2 g Ancef 1 g vancomycin due to positive MRSA screen.  DRAINS: None.  COMPLICATIONS: None.   CONDITION: PACU - hemodynamically stable.  BRIEF CLINICAL NOTE: Amy Black is a 75 y.o. female with a displaced right femoral neck fracture. Due to her young age and activity level, she was indicated for total hip arthroplasty. The risks, benefits, and alternatives to the procedure were explained, and the patient elected to proceed.  PROCEDURE IN DETAIL: Surgical site was marked by myself. Spinal anesthesia was obtained in the pre-op holding area. Once inside the operative room, a foley catheter was inserted. The patient was then positioned on the Hana table. All bony prominences were well padded. The hip was prepped and draped in the normal sterile surgical fashion. A time-out was called verifying side and site of surgery. The patient received IV antibiotics within 60 minutes of beginning the procedure.  The direct anterior approach to the hip was performed through the Hueter interval. Lateral femoral circumflex vessels were treated with the Auqumantys. The anterior capsule was exposed and an inverted T capsulotomy was made.Fracture hematoma was evacuated. She had a comminuted subcapital femoral neck fracture. The femoral neck cut was made to the level of the templated cut. A corkscrew was placed into the head and the head  was removed. The head was passed to the back table and was measured.  Acetabular exposure was achieved, and the pulvinar and labrum were excised. Sequental reaming of the acetabulum was then performed up to a size 49 mm reamer. A 50 mm cup was then opened and impacted into place at approximately 40 degrees of abduction and 20 degrees of anteversion. The cup had excellent stability, and I chose to augment fixation with a single cancellous bone screw. The final polyethylene liner was impacted into place and acetabular osteophytes were removed.   I then gained femoral exposure taking care to protect the abductors and greater trochanter. This was performed using standard external rotation, extension, and adduction. The capsule was peeled off the inner aspect of the greater trochanter, taking care to preserve the short external rotators. A cookie cutter was used to enter the femoral canal, and then the femoral canal finder was placed. Sequential broaching was performed up to a size 3. Calcar planer was used on the femoral neck remnant. I placed a std offset neck and a trial head ball. The hip was reduced. Leg lengths and offset were checked fluoroscopically. The hip was dislocated and trial components were removed. The final implants were placed, and the hip was reduced.  Fluoroscopy was used to confirm component position and leg lengths. At 90 degrees of external rotation and full extension, the hip was stable to an anterior directed force.  The wound was copiously irrigated with a dilute betadine solution followed by normal saline. Marcaine solution was injected into the periarticular soft tissue. The wound was closed in layers using #1 Vicryl and V-Loc for the fascia, 2-0  Vicryl for the subcutaneous fat, 2-0 Monocryl for the deep dermal layer, 3-0 running Monocryl subcuticular stitch, and Dermabond for the skin. Once the glue was fully dried, an Aquacell Ag dressing was applied. The patient was  transported to the recovery room in stable condition. Sponge, needle, and instrument counts were correct at the end of the case x2. The patient tolerated the procedure well and there were no known complications.

## 2015-10-04 NOTE — NC FL2 (Signed)
[place] MEDICAID FL2 LEVEL OF CARE SCREENING TOOL     IDENTIFICATION  Patient Name: Amy Black Birthdate: 1940/12/21 Sex: female Admission Date (Current Location): 10/03/2015  [facility] and [place] Number:  [employer] and Address:  [facility], [place] [address] [place], [place] [address]      Provider Number: [id number]  Attending Physician Name and Address:  Janece Canterbury, MD  Relative Name and Phone Number:       Current Level of Care: Hospital Recommended Level of Care: [place] Prior Approval Number:    Date Approved/Denied:   PASRR Number:  ([id number])  Discharge Plan: SNF    Current Diagnoses: Patient Active Problem List   Diagnosis Date Noted  . Femoral neck fracture, right, closed, initial encounter 10/04/2015  . Closed right hip fracture ([place]) 10/03/2015  . Thrombocytosis ([place]) 07/23/2015  . Exertional angina (HCC)   . Baker's cyst of knee 02/14/2013  . Coronary artery spasm ([place]) 11/19/2012  . Transverse myelitis ([place]) 10/10/2012  . HTN (hypertension) 08/29/2012  . Arthritis of neck ([place]) 08/29/2012  . Leg weakness 02/20/2012  . Adenocarcinoma of unknown primary ([place]) 02/06/2011  . Anxiety 05/19/2010  . Coronary atherosclerosis 12/03/2009  . GERD 12/03/2009    Orientation RESPIRATION BLADDER Height & Weight     Self, Time, Situation, Place    Continent Weight: 160 lb (72.6 kg) Height:  5\' 3"  (160 cm)  BEHAVIORAL SYMPTOMS/MOOD NEUROLOGICAL BOWEL NUTRITION STATUS      Continent    AMBULATORY STATUS COMMUNICATION OF NEEDS Skin     Verbally Normal                       Personal Care Assistance Level of Assistance  Bathing, Dressing Bathing Assistance: Limited assistance   Dressing Assistance: Limited assistance     Functional Limitations Info             SPECIAL CARE FACTORS FREQUENCY                       Contractures      Additional Factors Info                  Current Medications (10/04/2015):  This is the current hospital active medication list Current Facility-Administered Medications  Medication Dose Route Frequency Provider Last Rate Last Dose  . 0.9 %  sodium chloride infusion   Intravenous Continuous Jonetta Osgood, MD 75 mL/hr at 10/03/15 2101    . acetaminophen (TYLENOL) tablet 650 mg  650 mg Oral Q6H PRN Rod Can, MD       Or  . acetaminophen (TYLENOL) suppository 650 mg  650 mg Rectal Q6H PRN Rod Can, MD      . ALPRAZolam Duanne Moron) tablet 0.5 mg  0.5 mg Oral TID Jonetta Osgood, MD   Stopped at 10/04/15 1000  . [START ON 10/05/2015] aspirin EC tablet 81 mg  81 mg Oral BID WC Rod Can, MD      . docusate sodium (COLACE) capsule 100 mg  100 mg Oral BID Rod Can, MD      . fluticasone (FLONASE) 50 MCG/ACT nasal spray 2 spray  2 spray Each Nare Daily PRN Shanker Kristeen Mans, MD      . HYDROcodone-acetaminophen (NORCO/VICODIN) 5-325 MG per tablet 1-2 tablet  1-2 tablet Oral Q6H PRN Rod Can, MD      . isosorbide mononitrate (IMDUR) 24 hr tablet  15 mg  15 mg Oral Daily Jonetta Osgood, MD   Stopped at 10/04/15 1000  . loperamide (IMODIUM) capsule 2 mg  2 mg Oral QID PRN Jonetta Osgood, MD      . loratadine (CLARITIN) tablet 10 mg  10 mg Oral Daily Jonetta Osgood, MD   Stopped at 10/04/15 1000  . menthol-cetylpyridinium (CEPACOL) lozenge 3 mg  1 lozenge Oral PRN Rod Can, MD       Or  . phenol (CHLORASEPTIC) mouth spray 1 spray  1 spray Mouth/Throat PRN Rod Can, MD      . methocarbamol (ROBAXIN) tablet 500 mg  500 mg Oral Q6H PRN Rod Can, MD       Or  . methocarbamol (ROBAXIN) 500 mg in dextrose 5 % 50 mL IVPB  500 mg Intravenous Q6H PRN Rod Can, MD      . metoCLOPramide (REGLAN) tablet 5-10 mg  5-10 mg Oral Q8H PRN Rod Can, MD       Or  . metoCLOPramide (REGLAN) injection 5-10 mg  5-10 mg Intravenous Q8H PRN Rod Can, MD      . morphine 2 MG/ML injection  0.5 mg  0.5 mg Intravenous Q2H PRN Jonetta Osgood, MD   0.5 mg at 10/03/15 2101  . nitroGLYCERIN (NITROSTAT) SL tablet 0.4 mg  0.4 mg Sublingual Q5 min PRN Jonetta Osgood, MD      . ondansetron [facility]) tablet 4 mg  4 mg Oral Q6H PRN Rod Can, MD       Or  . ondansetron (ZOFRAN) injection 4 mg  4 mg Intravenous Q6H PRN Rod Can, MD      . pantoprazole (PROTONIX) EC tablet 40 mg  40 mg Oral BID Jonetta Osgood, MD   Stopped at 10/04/15 1000  . polyethylene glycol (MIRALAX / GLYCOLAX) packet 17 g  17 g Oral Daily PRN Jonetta Osgood, MD      . senna (SENOKOT) tablet 8.6 mg  1 tablet Oral BID Rod Can, MD      . vancomycin (VANCOCIN) 1-5 GM/200ML-% IVPB           . vancomycin (VANCOCIN) IVPB 1000 mg/200 mL premix  1,000 mg Intravenous Q12H Rod Can, MD      . verapamil (CALAN) tablet 80 mg  80 mg Oral QID Jonetta Osgood, MD   Stopped at 10/04/15 1000     Discharge Medications: Please see discharge summary for a list of discharge medications.  Relevant Imaging Results:  Relevant Lab Results:   Additional Information    Venetia Maxon, Alfred R

## 2015-10-04 NOTE — Anesthesia Procedure Notes (Signed)
Spinal  Start time: 10/04/2015 11:32 AM End time: 10/04/2015 11:36 AM Staffing Anesthesiologist: Lillia Abed Performed: anesthesiologist  Preanesthetic Checklist Completed: patient identified, site marked, surgical consent, pre-op evaluation, timeout performed, IV checked, risks and benefits discussed and monitors and equipment checked Spinal Block Patient position: sitting Prep: ChloraPrep Patient monitoring: heart rate, cardiac monitor, continuous pulse ox and blood pressure Approach: right paramedian Location: L3-4 Injection technique: single-shot Needle Needle type: Pencan  Needle gauge: 24 G Needle length: 9 cm Needle insertion depth: 7 cm

## 2015-10-04 NOTE — Transfer of Care (Signed)
Immediate Anesthesia Transfer of Care Note  Patient: Amy Black  Procedure(s) Performed: Procedure(s): TOTAL HIP ARTHROPLASTY ANTERIOR APPROACH (Right)  Patient Location: PACU  Anesthesia Type:MAC and Spinal  Level of Consciousness: awake, oriented and patient cooperative  Airway & Oxygen Therapy: Patient Spontanous Breathing and Patient connected to nasal cannula oxygen  Post-op Assessment: Report given to RN and Post -op Vital signs reviewed and stable  Post vital signs: Reviewed  Last Vitals:  Vitals:   10/03/15 2105 10/04/15 0100  BP: (!) 149/68 119/60  Pulse: 77 86  Resp:    Temp: 36.6 C 36.8 C    Last Pain:  Vitals:   10/04/15 0100  TempSrc: Oral  PainSc:          Complications: No apparent anesthesia complications

## 2015-10-05 ENCOUNTER — Encounter (HOSPITAL_COMMUNITY): Payer: Self-pay | Admitting: Orthopedic Surgery

## 2015-10-05 LAB — BASIC METABOLIC PANEL
Anion gap: 6 (ref 5–15)
BUN: 6 mg/dL (ref 6–20)
CO2: 27 mmol/L (ref 22–32)
Calcium: 8.7 mg/dL — ABNORMAL LOW (ref 8.9–10.3)
Chloride: 106 mmol/L (ref 101–111)
Creatinine, Ser: 0.86 mg/dL (ref 0.44–1.00)
GFR calc Af Amer: >60 mL/min (ref >60)
GFR calc non Af Amer: >60 mL/min (ref >60)
Glucose, Bld: 93 mg/dL (ref 65–99)
Potassium: 5 mmol/L (ref 3.5–5.1)
Sodium: 139 mmol/L (ref 135–145)

## 2015-10-05 LAB — CBC
HCT: 28.4 % — ABNORMAL LOW (ref 36.0–46.0)
Hemoglobin: 9 g/dL — ABNORMAL LOW (ref 12.0–15.0)
MCH: 30.1 pg (ref 26.0–34.0)
MCHC: 31.7 g/dL (ref 30.0–36.0)
MCV: 95 fL (ref 78.0–100.0)
Platelets: 328 10*3/uL (ref 150–400)
RBC: 2.99 MIL/uL — ABNORMAL LOW (ref 3.87–5.11)
RDW: 13.6 % (ref 11.5–15.5)
WBC: 7 10*3/uL (ref 4.0–10.5)

## 2015-10-05 MED ORDER — MUPIROCIN 2 % EX OINT
1.0000 "application " | TOPICAL_OINTMENT | Freq: Two times a day (BID) | CUTANEOUS | Status: DC
  Administered 2015-10-05 – 2015-10-06 (×3): 1 via NASAL
  Filled 2015-10-05: qty 22

## 2015-10-05 MED ORDER — CHLORHEXIDINE GLUCONATE CLOTH 2 % EX PADS
6.0000 | MEDICATED_PAD | Freq: Every day | CUTANEOUS | Status: DC
  Administered 2015-10-05 – 2015-10-06 (×2): 6 via TOPICAL

## 2015-10-05 MED ORDER — FERROUS SULFATE 325 (65 FE) MG PO TABS
325.0000 mg | ORAL_TABLET | Freq: Every day | ORAL | Status: DC
  Administered 2015-10-06: 325 mg via ORAL
  Filled 2015-10-05: qty 1

## 2015-10-05 NOTE — Evaluation (Signed)
Physical Therapy Evaluation Patient Details Name: Lenea Chery Vandenberg MRN: [id number] DOB: [birth year] Today's Date: 10/05/2015   History of Present Illness  pt presents after falling at home and sustaining a R Femur fx.  pt now s/p THR.  pt with hx of HTN, Anxiety, CAD, and Adenocarcinoma.    Clinical Impression  Pt very motivated and moving very well.  Pt indicates the only time she would be alone at home and in the afternoons when her daughter goes in for 2nd shift and her husband is driving home from 1st shift, otherwise she has great family support.  Feel pt is safe for return to home from PT perspective.  Will continue to follow while on acute.      Follow Up Recommendations Home health PT;Supervision - Intermittent    Equipment Recommendations  Rolling walker with 5" wheels;3in1 (PT)    Recommendations for Other Services       Precautions / Restrictions Precautions Precautions: Fall Restrictions Weight Bearing Restrictions: Yes RLE Weight Bearing: Weight bearing as tolerated      Mobility  Bed Mobility               General bed mobility comments: pt standing in room with OT.    Transfers Overall transfer level: Needs assistance Equipment used: Rolling walker (2 wheeled) Transfers: Sit to/from Stand Sit to Stand: Min guard         General transfer comment: cues for UE use and controlling descent to sitting.    Ambulation/Gait Ambulation/Gait assistance: Min guard Ambulation Distance (Feet): 140 Feet Assistive device: Rolling walker (2 wheeled) Gait Pattern/deviations: Step-to pattern;Decreased step length - left;Decreased stance time - right;Trunk flexed     General Gait Details: cues for more upright posture and positioning within RW.    Stairs Stairs: Yes Stairs assistance: Min guard Stair Management: Two rails;Step to pattern;Forwards Number of Stairs: 2 General stair comments: cues for sequencing on stairs and use of UEs on rails.    Wheelchair  Mobility    Modified Rankin (Stroke Patients Only)       Balance Overall balance assessment: Needs assistance Sitting-balance support: No upper extremity supported;Feet supported Sitting balance-Leahy Scale: Good     Standing balance support: Single extremity supported;Bilateral upper extremity supported;No upper extremity supported;During functional activity Standing balance-Leahy Scale: Fair Standing balance comment: pt needs UE support for balance challenges.                             Pertinent Vitals/Pain Pain Assessment: Faces Faces Pain Scale: Hurts a little bit Pain Location: R hip.  Indicates "Not pain, but tight" when asked about pain.  Pain Descriptors / Indicators: Tightness Pain Intervention(s): Monitored during session;Premedicated before session;Repositioned    Home Living Family/patient expects to be discharged to:: Private residence Living Arrangements: Children Available Help at Discharge: Family (Near 24hrs) Type of Home: House Home Access: Stairs to enter Entrance Stairs-Rails: Right;Left;Can reach both (at front has rails) Entrance Stairs-Number of Steps: 2 (10 steps back, 2 in front) Home Layout: Two level;Able to live on main level with bedroom/bathroom   Additional Comments: husband works 1st shift; daughter works 2nd    Prior Function Level of Independence: Independent         Comments: pt indicates she occasionally uses a cane.     Hand Dominance   Dominant Hand: Right    Extremity/Trunk Assessment   Upper Extremity Assessment: Defer to OT evaluation  Lower Extremity Assessment: RLE deficits/detail RLE Deficits / Details: AROM WFL, but strength limited by post-op "tightness".  Sensation intact.    Cervical / Trunk Assessment: Kyphotic  Communication   Communication: No difficulties  Cognition Arousal/Alertness: Awake/alert Behavior During Therapy: WFL for tasks assessed/performed Overall Cognitive  Status: Within Functional Limits for tasks assessed                      General Comments      Exercises Total Joint Exercises Ankle Circles/Pumps: AROM;Both;10 reps Quad Sets: AROM;Both;10 reps Hip ABduction/ADduction: AAROM;10 reps;Both Long Arc Quad: AROM;Both;10 reps Marching in Standing: AROM;Both;10 reps;Seated      Assessment/Plan    PT Assessment Patient needs continued PT services  PT Diagnosis Difficulty walking   PT Problem List Decreased strength;Decreased activity tolerance;Decreased balance;Decreased mobility;Decreased knowledge of use of DME  PT Treatment Interventions DME instruction;Gait training;Stair training;Functional mobility training;Therapeutic activities;Therapeutic exercise;Balance training;Patient/family education   PT Goals (Current goals can be found in the Care Plan section) Acute Rehab PT Goals Patient Stated Goal: home PT Goal Formulation: With patient Time For Goal Achievement: 10/12/15 Potential to Achieve Goals: Good    Frequency Min 5X/week   Barriers to discharge        Co-evaluation               End of Session Equipment Utilized During Treatment: Gait belt Activity Tolerance: Patient tolerated treatment well Patient left: in chair;with call bell/phone within reach Nurse Communication: Mobility status         Time: 0910-0930 PT Time Calculation (min) (ACUTE ONLY): 20 min   Charges:   PT Evaluation $PT Eval Moderate Complexity: 1 Procedure     PT G CodesCatarina Hartshorn, [place] 10/05/2015, 9:54 AM

## 2015-10-05 NOTE — Progress Notes (Signed)
PROGRESS NOTE  Amy Black  [id number] DOB: [birth year] DOA: 10/03/2015 PCP: [name], MD  Brief Narrative:  Amy Black is a 75 y.o. female with medical history significant of hypertension, anxiety, GERD, irritable bowel syndrome, history of adenocarcinoma of unknown primary-brought to the ED for evaluation after sustaining a mechanical fall. Per patient, she sustained a mechanical fall after she was attempting to get out of bed.  x-ray of the right hip showed a subcapital fracture.  She underwent right total hip arthroplasty on 10/04/2015.    Assessment & Plan:   Active Problems:   Coronary atherosclerosis   GERD   Anxiety   Adenocarcinoma of unknown primary ([place])   HTN (hypertension)   Closed right hip fracture (HCC)   Femoral neck fracture, right, closed, initial encounter  Closed right hip fracture secondary to mechanical fall s/p right total hip arthroplasty on 10/04/2015 -  Pain control -  PT/OT recommending home health -  Patient has friend who will stay with her during her recovery at home -  DVT proph per orthopedics  Hypertension, blood pressure stable, continue verapamil  GERD, stable, continue PPI  Nonobstructive CAD, chest pain free.  Continue aspirin, verapamil.   -  PCP to discuss adding standard of care statin and beta blocker with patient if not already done  Adenocarcinoma of unknown primary in remission.  Followed by oncology.  Anxiety, stable, continue xanax  IBS, stable.  Monitor for constipation postoperatively.    Postoperative acute blood loss anemia superimposed on anemia of chronic disease -  No need for blood transfusion   DVT prophylaxis:  SCDs Code Status:  full Family Communication:  Patient alone Disposition Plan:  To home tomorrow if continuing to improve   Consultants:   Dr. Lyla Glassing, Orthopedic surgery   Procedures:  Right total hip arthroplasty  Antimicrobials:   perioperative    Subjective: Was able to  walk to the stairway with PT.  Feels well.  Denies SOB, chest pain, nausea, vomiting.  Objective: Vitals:   10/04/15 2035 10/05/15 0403 10/05/15 0856 10/05/15 1100  BP: 122/83 (!) 123/52 107/80 135/68  Pulse: 78 63  96  Resp: 16 16  16   Temp: 98.1 F (36.7 C) 98.2 F (36.8 C)  98.2 F (36.8 C)  TempSrc: Oral Oral  Oral  SpO2: 100% 100%  100%  Weight:      Height:        Intake/Output Summary (Last 24 hours) at 10/05/15 1408 Last data filed at 10/05/15 1004  Gross per 24 hour  Intake          2611.25 ml  Output             1625 ml  Net           986.25 ml   Filed Weights   10/03/15 1524  Weight: 72.6 kg (160 lb)    Examination:  General exam:  Adult female.  No acute distress.  HEENT:  NCAT, MMM Respiratory system: Clear to auscultation bilaterally Cardiovascular system: Regular rate and rhythm, normal 99991111. 2/6 systolic murmur.  Warm extremities Gastrointestinal system: Normal active bowel sounds, soft, nondistended, nontender. MSK:  Normal tone and bulk, Swelling along the right lateral hip, TTP, no bruising.  Dressing c/d/i.  2+ pedal pulse Neuro:  Grossly intact.  Sensation intact to light touch right foot.  Able to wiggle toes on right foot.    Data Reviewed: I have personally reviewed following labs and imaging studies  CBC:  Recent Labs Lab 10/03/15 1740 10/04/15 0305 10/05/15 0501  WBC 9.0 5.2 7.0  NEUTROABS 6.2  --   --   HGB 10.8* 10.3* 9.0*  HCT 34.3* 32.9* 28.4*  MCV 94.5 95.4 95.0  PLT 415* 383 [id number]   Basic Metabolic Panel:  Recent Labs Lab 10/03/15 1740 10/04/15 0305 10/05/15 0501  NA 136 139 139  K 4.5 4.1 5.0  CL 101 105 106  CO2 27 26 27   GLUCOSE 97 96 93  BUN 11 9 6   CREATININE 1.01* 0.99 0.86  CALCIUM 9.6 9.1 8.7*   GFR: Estimated Creatinine Clearance: 56.5 mL/min (by C-G formula based on SCr of 0.86 mg/dL). Liver Function Tests: No results for input(s): AST, ALT, ALKPHOS, BILITOT, PROT, ALBUMIN in the last 168 hours. No  results for input(s): LIPASE, AMYLASE in the last 168 hours. No results for input(s): AMMONIA in the last 168 hours. Coagulation Profile:  Recent Labs Lab 10/03/15 1740  INR 1.13   Cardiac Enzymes: No results for input(s): CKTOTAL, CKMB, CKMBINDEX, TROPONINI in the last 168 hours. BNP (last 3 results) No results for input(s): PROBNP in the last 8760 hours. HbA1C: No results for input(s): HGBA1C in the last 72 hours. CBG: No results for input(s): GLUCAP in the last 168 hours. Lipid Profile: No results for input(s): CHOL, HDL, LDLCALC, TRIG, CHOLHDL, LDLDIRECT in the last 72 hours. Thyroid Function Tests: No results for input(s): TSH, T4TOTAL, FREET4, T3FREE, THYROIDAB in the last 72 hours. Anemia Panel: No results for input(s): VITAMINB12, FOLATE, FERRITIN, TIBC, IRON, RETICCTPCT in the last 72 hours. Urine analysis:    Component Value Date/Time   COLORURINE YELLOW 10/03/2015 1852   APPEARANCEUR CLEAR 10/03/2015 1852   LABSPEC 1.009 10/03/2015 1852   PHURINE 6.0 10/03/2015 1852   GLUCOSEU NEGATIVE 10/03/2015 1852   HGBUR NEGATIVE 10/03/2015 1852   BILIRUBINUR NEGATIVE 10/03/2015 1852   BILIRUBINUR small 11/16/2014 1136   KETONESUR NEGATIVE 10/03/2015 1852   PROTEINUR NEGATIVE 10/03/2015 1852   UROBILINOGEN negative 11/16/2014 1136   UROBILINOGEN 0.2 06/12/2013 0352   NITRITE NEGATIVE 10/03/2015 1852   LEUKOCYTESUR TRACE (A) 10/03/2015 1852   Sepsis Labs: @LABRCNTIP (procalcitonin:4,lacticidven:4)  ) Recent Results (from the past 240 hour(s))  Surgical pcr screen     Status: Abnormal   Collection Time: 10/04/15  5:11 AM  Result Value Ref Range Status   MRSA, PCR POSITIVE (A) NEGATIVE Final   Staphylococcus aureus POSITIVE (A) NEGATIVE Final    Comment:        The Xpert SA Assay (FDA approved for NASAL specimens in patients over 79 years of age), is one component of a comprehensive surveillance program.  Test performance has been validated by [facility] for  patients greater than or equal to 52 year old. It is not intended to diagnose infection nor to guide or monitor treatment. RESULT CALLED TO, READ BACK BY AND VERIFIED WITH: R LIGHT,RN AT 0806 10/04/15 BY L BENFIELD       Radiology Studies: Dg Wrist Complete Right  Result Date: 10/03/2015 CLINICAL DATA:  Wrist pain after falling today.  Initial encounter. EXAM: RIGHT WRIST - COMPLETE 3+ VIEW COMPARISON:  None. FINDINGS: The mineralization and alignment are normal. There is no evidence of acute fracture or dislocation. There are minimal degenerative changes for age. There is an ulnar minus variance at the wrist. No focal soft tissue abnormalities are seen. IMPRESSION: No evidence of acute fracture or dislocation. Electronically Signed   By: Richardean Sale M.D.   On: 10/03/2015 17:04  Ct Wrist Right Wo Contrast  Result Date: 10/03/2015 CLINICAL DATA:  Patient fell and caught herself in the right wrist. Pain in the distal radius with possible fracture. EXAM: CT OF THE RIGHT WRIST WITHOUT CONTRAST TECHNIQUE: Multidetector CT imaging of the right wrist was performed according to the standard protocol. Multiplanar CT image reconstructions were also generated. COMPARISON:  Right wrist radiographs 10/03/2015 FINDINGS: Bones/Joint/Cartilage Carpal bones, visualized metacarpal bones, and visualized distal radius and ulna appear intact. No acute fractures are demonstrated. MRI would be more sensitive for detection of bone bruises or other bony injury if clinically indicated. Tiny amount of gas demonstrated in the lunate toe capitate joint, likely degenerative. No focal bone lesion or bone destruction. Ligaments Suboptimally assessed by CT. Muscles and Tendons No intramuscular hematoma or fluid collection demonstrated. Soft tissues No significant soft tissue infiltration or swelling. IMPRESSION: No acute fracture or dislocation demonstrated in the wrist. Mild degenerative changes are present. Electronically  Signed   By: Lucienne Capers M.D.   On: 10/03/2015 21:22   Pelvis Portable  Result Date: 10/04/2015 CLINICAL DATA:  Status post right hip arthroplasty. EXAM: PORTABLE PELVIS 1-2 VIEWS COMPARISON:  Same day. FINDINGS: The femoral and acetabular components appear to be well situated. No fracture or dislocation is noted. Expected postoperative changes are seen in the surrounding soft tissues. IMPRESSION: Status post right total hip arthroplasty. Electronically Signed   By: Marijo Conception, M.D.   On: 10/04/2015 15:49   Dg Knee Right Port  Result Date: 10/03/2015 CLINICAL DATA:  Femoral neck fracture. EXAM: PORTABLE RIGHT KNEE - 1-2 VIEW COMPARISON:  None. FINDINGS: No evidence of fracture, dislocation, or joint effusion. There are 3 compartment osteoarthritic changes of the right knee, worse at the patellofemoral compartment. Well corticated calcific density anterior to the distal femur may represent calcific tendinitis or intracapsular loose body. Soft tissues are otherwise unremarkable. IMPRESSION: No acute fracture or dislocation identified about the right Knee. Three compartment osteoarthritis, worse at the patellofemoral compartment. Electronically Signed   By: Fidela Salisbury M.D.   On: 10/03/2015 18:45   Dg C-arm 1-60 Min  Result Date: 10/04/2015 CLINICAL DATA:  Right follow-up arthroplasty, anterior approach. EXAM: DG C-ARM 61-120 MIN; OPERATIVE RIGHT HIP WITH PELVIS COMPARISON:  10/03/2015. FINDINGS: Three intraoperative fluoroscopic spot views of the right hip initially show a reamer in the proximal right femur with the acetabular cup in place. Subsequent images show right hip arthroplasty in anatomic position. IMPRESSION: Intraoperative visualization of a right hip arthroplasty. Electronically Signed   By: Lorin Picket M.D.   On: 10/04/2015 14:14   Dg Hip Operative Unilat W Or W/o Pelvis Right  Result Date: 10/04/2015 CLINICAL DATA:  Right follow-up arthroplasty, anterior approach.  EXAM: DG C-ARM 61-120 MIN; OPERATIVE RIGHT HIP WITH PELVIS COMPARISON:  10/03/2015. FINDINGS: Three intraoperative fluoroscopic spot views of the right hip initially show a reamer in the proximal right femur with the acetabular cup in place. Subsequent images show right hip arthroplasty in anatomic position. IMPRESSION: Intraoperative visualization of a right hip arthroplasty. Electronically Signed   By: Lorin Picket M.D.   On: 10/04/2015 14:14   Dg Hip Unilat With Pelvis 2-3 Views Right  Result Date: 10/03/2015 CLINICAL DATA:  Right hip pain status post fall. EXAM: DG HIP (WITH OR WITHOUT PELVIS) 2-3V RIGHT COMPARISON:  None. FINDINGS: There is an impacted subcapital right femoral neck fracture with minimal medial displacement of the distal fracture fragment. No other fractures are seen. Soft tissues are grossly normal. IMPRESSION:  Impacted subcapital right femoral neck fracture. Electronically Signed   By: Fidela Salisbury M.D.   On: 10/03/2015 17:03     Scheduled Meds: . ALPRAZolam  0.5 mg Oral TID  . aspirin EC  81 mg Oral BID WC  . Chlorhexidine Gluconate Cloth  6 each Topical Q0600  . docusate sodium  100 mg Oral BID  . isosorbide mononitrate  15 mg Oral Daily  . loratadine  10 mg Oral Daily  . mupirocin ointment  1 application Nasal BID  . pantoprazole  40 mg Oral BID  . senna  1 tablet Oral BID  . verapamil  80 mg Oral QID   Continuous Infusions: . sodium chloride 75 mL/hr at 10/05/15 0615     LOS: 2 days    Time spent: 30 min    Janece Canterbury, MD [facility] Pager [phone]  If 7PM-7AM, please contact night-coverage [email or web address] Password [email or web address] 10/05/2015, 2:08 PM

## 2015-10-05 NOTE — Progress Notes (Signed)
Urinary catheter discontinued without difficulty POD 1.  Adequate UOP recorded.  Patient due to void and encouraged to call for toileting assistance. 

## 2015-10-05 NOTE — Progress Notes (Signed)
   Subjective:  Patient reports pain as mild to moderate.  No c/o.  Objective:   VITALS:   Vitals:   10/04/15 1450 10/04/15 1509 10/04/15 2035 10/05/15 0403  BP: (!) 145/62 (!) 142/68 122/83 (!) 123/52  Pulse: 64 66 78 63  Resp: 13 14 16 16   Temp: 97.2 F (36.2 C) 97.2 F (36.2 C) 98.1 F (36.7 C) 98.2 F (36.8 C)  TempSrc:  Oral Oral Oral  SpO2: 100% 99% 100% 100%  Weight:      Height:        ABD soft Sensation intact distally Intact pulses distally Dorsiflexion/Plantar flexion intact Incision: dressing C/D/I Compartment soft   Lab Results  Component Value Date   WBC 7.0 10/05/2015   HGB 9.0 (L) 10/05/2015   HCT 28.4 (L) 10/05/2015   MCV 95.0 10/05/2015   PLT 328 10/05/2015   BMET    Component Value Date/Time   NA 139 10/05/2015 0501   NA 136 07/23/2015 1352   K 5.0 10/05/2015 0501   K 3.8 07/23/2015 1352   CL 106 10/05/2015 0501   CL 101 07/20/2014 1131   CO2 27 10/05/2015 0501   CO2 27 07/23/2015 1352   GLUCOSE 93 10/05/2015 0501   GLUCOSE 93 07/23/2015 1352   GLUCOSE 87 07/20/2014 1131   BUN 6 10/05/2015 0501   BUN 13.8 07/23/2015 1352   CREATININE 0.86 10/05/2015 0501   CREATININE 1.1 07/23/2015 1352   CALCIUM 8.7 (L) 10/05/2015 0501   CALCIUM 9.6 07/23/2015 1352   GFRNONAA >60 10/05/2015 0501   GFRNONAA 44 (L) 08/29/2012 1316   GFRAA >60 10/05/2015 0501   GFRAA 51 (L) 08/29/2012 1316     Assessment/Plan: 1 Day Post-Op   Active Problems:   Coronary atherosclerosis   GERD   Anxiety   Adenocarcinoma of unknown primary (HCC)   HTN (hypertension)   Closed right hip fracture (HCC)   Femoral neck fracture, right, closed, initial encounter    WBAT with walker DVT ppx: ASA, SCDs, TEDs PT/OT Dispo: pending PT/OT eval, pt wants to d/c home with HHPT   Annemarie Sebree, Horald Pollen 10/05/2015, 7:47 AM   Rod Can, MD Cell [phone]

## 2015-10-05 NOTE — Evaluation (Signed)
Occupational Therapy Evaluation Patient Details Name: Amy Black MRN: [id number] DOB: [birth year] Today's Date: 10/05/2015    History of Present Illness s/p R DA THA   Clinical Impression   This 75 year old female sustained a R hip fx and is s/p the above sx. All education was completed. No further OT is needed at this time    Follow Up Recommendations  Supervision/Assistance - 24 hour    Equipment Recommendations  3 in 1 bedside comode    Recommendations for Other Services       Precautions / Restrictions Precautions Precautions: Fall Restrictions Weight Bearing Restrictions: No      Mobility Bed Mobility               General bed mobility comments: oob  Transfers Overall transfer level: Needs assistance Equipment used: Rolling walker (2 wheeled) Transfers: Sit to/from Stand Sit to Stand: Min guard         General transfer comment: cues for UE and LE placement    Balance                                            ADL Overall ADL's : Needs assistance/impaired     Grooming: Oral care;Set up;Sitting   Upper Body Bathing: Set up;Sitting   Lower Body Bathing: Minimal assistance;Sit to/from stand   Upper Body Dressing : Set up;Sitting   Lower Body Dressing: Minimal assistance;Sit to/from stand   Toilet Transfer: Min guard;Ambulation;BSC;RW   Toileting- Water quality scientist and Hygiene: Min guard;Sit to/from stand         General ADL Comments: pt was in bathroom washing up when OT arrived. She has a tub and will sponge bathe initially.  Educated to work within her pain tolerance.  She will have 24/7 at home.     Vision     Perception     Praxis      Pertinent Vitals/Pain Pain Assessment: Faces Faces Pain Scale: Hurts a little bit Pain Location: R hip Pain Descriptors / Indicators: Sore Pain Intervention(s): Limited activity within patient's tolerance;Monitored during session;Premedicated before session      Hand Dominance Right   Extremity/Trunk Assessment Upper Extremity Assessment Upper Extremity Assessment: Overall WFL for tasks assessed           Communication Communication Communication: No difficulties   Cognition Arousal/Alertness: Awake/alert Behavior During Therapy: WFL for tasks assessed/performed Overall Cognitive Status: Within Functional Limits for tasks assessed                     General Comments       Exercises       Shoulder Instructions      Home Living Family/patient expects to be discharged to:: Private residence Living Arrangements: Children Available Help at Discharge: Family Type of Home: House Home Access: Stairs to enter [employer] of Steps:  (10 steps back, 2 in front can reach both handrails)   Home Layout: Two level;Able to live on main level with bedroom/bathroom     Bathroom Shower/Tub: Tub/shower unit Shower/tub characteristics: Architectural technologist: Standard         Additional Comments: husband works 1st shift; daughter works 2nd      Prior Functioning/Environment Level of Independence: Independent             OT Diagnosis: Acute pain   OT Problem List:  OT Treatment/Interventions:      OT Goals(Current goals can be found in the care plan section) Acute Rehab OT Goals Patient Stated Goal: home OT Goal Formulation: All assessment and education complete, DC therapy  OT Frequency:     Barriers to D/C:            Co-evaluation              End of Session    Activity Tolerance: Patient tolerated treatment well Patient left:  (handed over to PT)   Time: [id number] OT Time Calculation (min): 18 min Charges:  OT General Charges $OT Visit: 1 Procedure OT Evaluation $OT Eval Low Complexity: 1 Procedure G-Codes:    Avin Gibbons 2015/10/18, 9:18 AM  Lesle Chris, OTR/L [phone] 10-18-15

## 2015-10-06 DIAGNOSIS — F419 Anxiety disorder, unspecified: Secondary | ICD-10-CM

## 2015-10-06 DIAGNOSIS — I25119 Atherosclerotic heart disease of native coronary artery with unspecified angina pectoris: Secondary | ICD-10-CM

## 2015-10-06 DIAGNOSIS — C801 Malignant (primary) neoplasm, unspecified: Secondary | ICD-10-CM

## 2015-10-06 DIAGNOSIS — S72001A Fracture of unspecified part of neck of right femur, initial encounter for closed fracture: Secondary | ICD-10-CM

## 2015-10-06 DIAGNOSIS — I10 Essential (primary) hypertension: Secondary | ICD-10-CM

## 2015-10-06 DIAGNOSIS — S72001D Fracture of unspecified part of neck of right femur, subsequent encounter for closed fracture with routine healing: Secondary | ICD-10-CM

## 2015-10-06 DIAGNOSIS — K219 Gastro-esophageal reflux disease without esophagitis: Secondary | ICD-10-CM

## 2015-10-06 LAB — CBC
HCT: 27.2 % — ABNORMAL LOW (ref 36.0–46.0)
Hemoglobin: 8.5 g/dL — ABNORMAL LOW (ref 12.0–15.0)
MCH: 29.7 pg (ref 26.0–34.0)
MCHC: 31.3 g/dL (ref 30.0–36.0)
MCV: 95.1 fL (ref 78.0–100.0)
Platelets: 301 10*3/uL (ref 150–400)
RBC: 2.86 MIL/uL — ABNORMAL LOW (ref 3.87–5.11)
RDW: 13.4 % (ref 11.5–15.5)
WBC: 6.7 10*3/uL (ref 4.0–10.5)

## 2015-10-06 LAB — BASIC METABOLIC PANEL
Anion gap: 5 (ref 5–15)
BUN: 7 mg/dL (ref 6–20)
CO2: 28 mmol/L (ref 22–32)
Calcium: 8.8 mg/dL — ABNORMAL LOW (ref 8.9–10.3)
Chloride: 106 mmol/L (ref 101–111)
Creatinine, Ser: 0.81 mg/dL (ref 0.44–1.00)
GFR calc Af Amer: >60 mL/min (ref >60)
GFR calc non Af Amer: >60 mL/min (ref >60)
Glucose, Bld: 91 mg/dL (ref 65–99)
Potassium: 4.3 mmol/L (ref 3.5–5.1)
Sodium: 139 mmol/L (ref 135–145)

## 2015-10-06 MED ORDER — ASPIRIN 81 MG PO TABS: 81.0000 mg | ORAL_TABLET | Freq: Two times a day (BID) | ORAL | Status: DC

## 2015-10-06 MED ORDER — DOCUSATE SODIUM 100 MG PO CAPS: 100.0000 mg | ORAL_CAPSULE | Freq: Two times a day (BID) | ORAL | 0 refills | Status: DC

## 2015-10-06 MED ORDER — FERROUS SULFATE 325 (65 FE) MG PO TABS
325.0000 mg | ORAL_TABLET | Freq: Every day | ORAL | 3 refills | Status: DC
Start: 2015-10-06 — End: 2016-02-01

## 2015-10-06 NOTE — Progress Notes (Signed)
Pt stable for d/c home today per MD. Pt met PT/OT goals, equipment has been delivered to pt's room. Prescriptions and discharge instructions reviewed with pt and family member, all questions answered. Left a message with [facility] orthopedics in regards to a pain medication prescription-none have been written at this point. Waiting for a call back from Dr. Lyla Glassing.   [place], Jerry Caras

## 2015-10-06 NOTE — Progress Notes (Signed)
   Subjective:  Patient reports pain as mild to moderate.  No c/o. Ambulated in hall yesterday.  Objective:   VITALS:   Vitals:   10/05/15 0856 10/05/15 1100 10/05/15 2030 10/06/15 0552  BP: 107/80 135/68 119/60 126/74  Pulse:  96 92 92  Resp:  16 16 16   Temp:  98.2 F (36.8 C) 98.2 F (36.8 C) 98.6 F (37 C)  TempSrc:  Oral Oral Oral  SpO2:  100% 98% 96%  Weight:      Height:        ABD soft Sensation intact distally Intact pulses distally Dorsiflexion/Plantar flexion intact Incision: dressing C/D/I Compartment soft   Lab Results  Component Value Date   WBC 6.7 10/06/2015   HGB 8.5 (L) 10/06/2015   HCT 27.2 (L) 10/06/2015   MCV 95.1 10/06/2015   PLT 301 10/06/2015   BMET    Component Value Date/Time   NA 139 10/06/2015 0506   NA 136 07/23/2015 1352   K 4.3 10/06/2015 0506   K 3.8 07/23/2015 1352   CL 106 10/06/2015 0506   CL 101 07/20/2014 1131   CO2 28 10/06/2015 0506   CO2 27 07/23/2015 1352   GLUCOSE 91 10/06/2015 0506   GLUCOSE 93 07/23/2015 1352   GLUCOSE 87 07/20/2014 1131   BUN 7 10/06/2015 0506   BUN 13.8 07/23/2015 1352   CREATININE 0.81 10/06/2015 0506   CREATININE 1.1 07/23/2015 1352   CALCIUM 8.8 (L) 10/06/2015 0506   CALCIUM 9.6 07/23/2015 1352   GFRNONAA >60 10/06/2015 0506   GFRNONAA 44 (L) 08/29/2012 1316   GFRAA >60 10/06/2015 0506   GFRAA 51 (L) 08/29/2012 1316     Assessment/Plan: 2 Days Post-Op   Active Problems:   Coronary atherosclerosis   GERD   Anxiety   Adenocarcinoma of unknown primary (HCC)   HTN (hypertension)   Closed right hip fracture (HCC)   Femoral neck fracture, right, closed, initial encounter    WBAT with walker DVT ppx: ASA, SCDs, TEDs PT/OT PO pain control Dispo: OK for d/c home with HHPT from ortho standpoint   Kashish Yglesias, Horald Pollen 10/06/2015, 7:16 AM   Rod Can, MD Cell [phone]

## 2015-10-06 NOTE — Care Management Note (Signed)
Case Management Note  Patient Details  Name: Amy Black MRN: [id number] Date of Birth: Jan 21, 1941  Subjective/Objective:    75 yr old female s/p right hip fracture, underwent a right hip arthroplasty.                Action/Plan: Case manager spoke with patient on 10/05/15 to arrange Home Health and DME. Choice was offered for Home Health agency. Patient requested [facility]. CM called referral to [facility]. DME has been ordered. Patient states she will have support at discharge.   Expected Discharge Date:  10/05/15               Expected Discharge Plan:  [place]  In-House Referral:     Discharge planning Services  CM Consult  Post Acute Care Choice:  Home Health, Durable Medical Equipment Choice offered to:  Patient  DME Arranged:  3-N-1, Walker rolling DME Agency:     HH Arranged:  PT [place]:  [facility]  Status of Service:  Completed, signed off  If discussed at [place] of Stay Meetings, dates discussed:    Additional Comments:  Ninfa Meeker, RN 10/06/2015, 12:38 PM

## 2015-10-06 NOTE — Progress Notes (Signed)
Physical Therapy Treatment Patient Details Name: Amy Black MRN: [id number] DOB: April 08, 1940 Today's Date: 10/06/2015    History of Present Illness pt presents after falling at home and sustaining a R Femur fx.  pt now s/p THR.  pt with hx of HTN, Anxiety, CAD, and Adenocarcinoma.      PT Comments    Pt continues to be very motivated and only limitation today was that she indicated her R wrist was bothering her when pushing up to standing.  Continue to feel pt is appropriate for returning to home with family support.  Will continue to follow if remains on acute.    Follow Up Recommendations  Home health PT;Supervision - Intermittent     Equipment Recommendations  Rolling walker with 5" wheels;3in1 (PT)    Recommendations for Other Services       Precautions / Restrictions Precautions Precautions: Fall Restrictions Weight Bearing Restrictions: Yes RLE Weight Bearing: Weight bearing as tolerated    Mobility  Bed Mobility               General bed mobility comments: pt up in recliner.  Transfers Overall transfer level: Needs assistance Equipment used: Rolling walker (2 wheeled) Transfers: Sit to/from Stand Sit to Stand: Min assist         General transfer comment: pt indicating R wrist bothering her with attempting to push up to standing.    Ambulation/Gait Ambulation/Gait assistance: Min guard Ambulation Distance (Feet): 150 Feet Assistive device: Rolling walker (2 wheeled) Gait Pattern/deviations: Step-to pattern;Decreased step length - left;Decreased stance time - right     General Gait Details: cues for more upright posture and positioning within RW.     Stairs            Wheelchair Mobility    Modified Rankin (Stroke Patients Only)       Balance Overall balance assessment: Needs assistance Sitting-balance support: No upper extremity supported;Feet supported Sitting balance-Leahy Scale: Good     Standing balance support: Single  extremity supported;Bilateral upper extremity supported;No upper extremity supported;During functional activity Standing balance-Leahy Scale: Fair                      Cognition Arousal/Alertness: Awake/alert Behavior During Therapy: WFL for tasks assessed/performed Overall Cognitive Status: Within Functional Limits for tasks assessed                      Exercises      General Comments        Pertinent Vitals/Pain Pain Assessment: No/denies pain    Home Living                      Prior Function            PT Goals (current goals can now be found in the care plan section) Acute Rehab PT Goals Patient Stated Goal: home PT Goal Formulation: With patient Time For Goal Achievement: 10/12/15 Potential to Achieve Goals: Good Progress towards PT goals: Progressing toward goals    Frequency  Min 5X/week    PT Plan Current plan remains appropriate    Co-evaluation             End of Session Equipment Utilized During Treatment: Gait belt Activity Tolerance: Patient tolerated treatment well Patient left: in chair;with call bell/phone within reach     Time: 1013-1031 PT Time Calculation (min) (ACUTE ONLY): 18 min  Charges:  $Gait Training: 8-22 mins  G CodesCatarina Black, [place] 10/06/2015, 10:40 AM

## 2015-10-06 NOTE — Discharge Summary (Signed)
Physician Discharge Summary  Amy Black [id number] DOB: 26-Aug-1940 DOA: 10/03/2015  PCP: [name], MD  Admit date: 10/03/2015 Discharge date: 10/06/2015  Admitted From: Home Disposition: Home with HHPT  Recommendations for Outpatient Follow-up:  1. Follow up with orthopedist and PCP 1-2 weeks 2. Please obtain BMP/CBC on outpatient follow up  Home Health: YES  Discharge Condition: Stable  CODE STATUS: FULL  Brief Narrative: [place] Scalesis a 75 y.o.femalewith medical history significant ofhypertension, anxiety, GERD, irritable bowel syndrome, history of adenocarcinoma of unknown primary-brought to the ED for evaluation after sustaining a mechanical fall. Per patient, she sustained a mechanical fall after she was attempting to get out of bed.  x-ray of the right hip showed a subcapital fracture.  She underwent right total hip arthroplasty on 10/04/2015.   Closed right hip fracture secondary to mechanical fall s/p right total hip arthroplasty on 10/04/2015 -  Pain control -  PT/OT recommending home health -  Patient has friend who will stay with her during her recovery at home -  DVT proph per orthopedics - aspirin BID with meals, follow up with orthopedics in 2 weeks  Hypertension, blood pressure stable, continue verapamil  GERD, stable, continue PPI  Nonobstructive CAD, chest pain free.  Continue aspirin, verapamil.   -  PCP to discuss adding standard of care statin and beta blocker with patient if not already done  Adenocarcinoma of unknown primary in remission.  Followed by oncology.  Anxiety, stable, continue home xanax  IBS, stable.  Monitor for constipation postoperatively.     Postoperative acute blood loss anemia superimposed on anemia of chronic disease -  No need for blood transfusion, repeat CBC outpatient, continue daily iron tablet and stool softener  DVT prophylaxis:  SCDs Code Status:  full Family Communication:   Attempted to call son, left message Disposition Plan:  To home    Consultants:   Dr. Lyla Glassing, Orthopedic surgery   Procedures:  Right total hip arthroplasty  Discharge Diagnoses:  Active Problems:   Coronary atherosclerosis   GERD   Anxiety   Adenocarcinoma of unknown primary ([place])   HTN (hypertension)   Closed right hip fracture (HCC)   Femoral neck fracture, right, closed, initial encounter  Discharge Instructions  Discharge Instructions    Increase activity slowly    Complete by:  As directed       Medication List    STOP taking these medications   amitriptyline 10 MG tablet Commonly known as:  ELAVIL   diphenoxylate-atropine 2.5-0.025 MG tablet Commonly known as:  LOMOTIL     TAKE these medications   ALPRAZolam 0.5 MG tablet Commonly known as:  XANAX TAKE ONE TABLET BY MOUTH THREE TIMES DAILY   aspirin 81 MG tablet Take 1 tablet (81 mg total) by mouth 2 (two) times daily with a meal. What changed:  when to take this   cetirizine 10 MG tablet Commonly known as:  ZYRTEC Take 1 tablet (10 mg total) by mouth 2 (two) times daily. What changed:  when to take this  reasons to take this   docusate sodium 100 MG capsule Commonly known as:  COLACE Take 1 capsule (100 mg total) by mouth 2 (two) times daily.   ferrous sulfate 325 (65 FE) MG tablet Take 1 tablet (325 mg total) by mouth daily with breakfast.   fluticasone 50 MCG/ACT nasal spray Commonly known as:  FLONASE Place 2 sprays into both nostrils daily. What changed:  when to take this  reasons  to take this   hydrocortisone 2.5 % rectal cream Commonly known as:  ANUSOL-HC Apply rectally 2 times daily as needed What changed:  how much to take  how to take this  additional instructions   isosorbide mononitrate 30 MG 24 hr tablet Commonly known as:  IMDUR TAKE 1/2 TABLET BY MOUTH DAILY. What changed:  See the new instructions.   loperamide 2 MG capsule Commonly known as:   IMODIUM TAKE ONE CAPSULE BY MOUTH FOUR TIMES DAILY AS NEEDED FOR DIARRHEA / LOOSE STOOLS What changed:  how much to take  how to take this  when to take this  reasons to take this  additional instructions   NITROSTAT 0.4 MG SL tablet Generic drug:  nitroGLYCERIN DISSOLVE ONE TABLET UNDER TONGUE EVERY 5 MINUTES UP TO 3 DOSES AS NEEDED FOR CHEST PAIN What changed:  See the new instructions.   pantoprazole 40 MG tablet Commonly known as:  PROTONIX Take 1 tablet (40 mg total) by mouth 2 (two) times daily. KEEP OV.   traMADol 50 MG tablet Commonly known as:  ULTRAM TAKE ONE TABLET BY MOUTH EVERY TWELVE HOURS AS NEEDED. What changed:  See the new instructions.   triamterene-hydrochlorothiazide 37.5-25 MG capsule Commonly known as:  DYAZIDE Take 1 each (1 capsule total) by mouth daily. What changed:  when to take this   verapamil 80 MG tablet Commonly known as:  CALAN TAKE ONE TABLET BY MOUTH FOUR TIMES DAILY What changed:  See the new instructions.   Vitamin D (Ergocalciferol) 50000 units Caps capsule Commonly known as:  DRISDOL Take 50,000 Units by mouth every Monday.      Follow-up Information    Swinteck, Horald Pollen, MD. Schedule an appointment as soon as possible for a visit in 2 week(s).   Specialty:  Orthopedic Surgery Why:  For wound re-check Contact information: [address]. [address] [place] [address] [id number]        [name], MD. Schedule an appointment as soon as possible for a visit in 2 week(s).   Specialty:  Family Medicine Why:  Hospital Follow Up Contact information: [place] [phone]          Allergies  Allergen Reactions  . Iodine Itching  . Cymbalta [Duloxetine Hcl] Other (See Comments)    sleepiness  . Gabapentin Other (See Comments)    sleepiness  . Ivp Dye [Iodinated Diagnostic Agents] Itching  . Zanaflex [Tizanidine Hcl] Other (See Comments)    Very drunk feeling next day    Procedures/Studies: Dg Wrist Complete Right  Result Date: 10/03/2015 CLINICAL DATA:  Wrist pain after falling today.  Initial encounter. EXAM: RIGHT WRIST - COMPLETE 3+ VIEW COMPARISON:  None. FINDINGS: The mineralization and alignment are normal. There is no evidence of acute fracture or dislocation. There are minimal degenerative changes for age. There is an ulnar minus variance at the wrist. No focal soft tissue abnormalities are seen. IMPRESSION: No evidence of acute fracture or dislocation. Electronically Signed   By: Richardean Sale M.D.   On: 10/03/2015 17:04   Ct Wrist Right Wo Contrast  Result Date: 10/03/2015 CLINICAL DATA:  Patient fell and caught herself in the right wrist. Pain in the distal radius with possible fracture. EXAM: CT OF THE RIGHT WRIST WITHOUT CONTRAST TECHNIQUE: Multidetector CT imaging of the right wrist was performed according to the standard protocol. Multiplanar CT image reconstructions were also generated. COMPARISON:  Right wrist radiographs 10/03/2015 FINDINGS: Bones/Joint/Cartilage Carpal bones, visualized metacarpal  bones, and visualized distal radius and ulna appear intact. No acute fractures are demonstrated. MRI would be more sensitive for detection of bone bruises or other bony injury if clinically indicated. Tiny amount of gas demonstrated in the lunate toe capitate joint, likely degenerative. No focal bone lesion or bone destruction. Ligaments Suboptimally assessed by CT. Muscles and Tendons No intramuscular hematoma or fluid collection demonstrated. Soft tissues No significant soft tissue infiltration or swelling. IMPRESSION: No acute fracture or dislocation demonstrated in the wrist. Mild degenerative changes are present. Electronically Signed   By: Lucienne Capers M.D.   On: 10/03/2015 21:22   Pelvis Portable  Result Date: 10/04/2015 CLINICAL DATA:  Status post right hip arthroplasty. EXAM: PORTABLE PELVIS 1-2 VIEWS COMPARISON:  Same day. FINDINGS: The  femoral and acetabular components appear to be well situated. No fracture or dislocation is noted. Expected postoperative changes are seen in the surrounding soft tissues. IMPRESSION: Status post right total hip arthroplasty. Electronically Signed   By: Marijo Conception, M.D.   On: 10/04/2015 15:49   Dg Knee Right Port  Result Date: 10/03/2015 CLINICAL DATA:  Femoral neck fracture. EXAM: PORTABLE RIGHT KNEE - 1-2 VIEW COMPARISON:  None. FINDINGS: No evidence of fracture, dislocation, or joint effusion. There are 3 compartment osteoarthritic changes of the right knee, worse at the patellofemoral compartment. Well corticated calcific density anterior to the distal femur may represent calcific tendinitis or intracapsular loose body. Soft tissues are otherwise unremarkable. IMPRESSION: No acute fracture or dislocation identified about the right Knee. Three compartment osteoarthritis, worse at the patellofemoral compartment. Electronically Signed   By: Fidela Salisbury M.D.   On: 10/03/2015 18:45   Dg C-arm 1-60 Min  Result Date: 10/04/2015 CLINICAL DATA:  Right follow-up arthroplasty, anterior approach. EXAM: DG C-ARM 61-120 MIN; OPERATIVE RIGHT HIP WITH PELVIS COMPARISON:  10/03/2015. FINDINGS: Three intraoperative fluoroscopic spot views of the right hip initially show a reamer in the proximal right femur with the acetabular cup in place. Subsequent images show right hip arthroplasty in anatomic position. IMPRESSION: Intraoperative visualization of a right hip arthroplasty. Electronically Signed   By: Lorin Picket M.D.   On: 10/04/2015 14:14   Dg Hip Operative Unilat W Or W/o Pelvis Right  Result Date: 10/04/2015 CLINICAL DATA:  Right follow-up arthroplasty, anterior approach. EXAM: DG C-ARM 61-120 MIN; OPERATIVE RIGHT HIP WITH PELVIS COMPARISON:  10/03/2015. FINDINGS: Three intraoperative fluoroscopic spot views of the right hip initially show a reamer in the proximal right femur with the acetabular  cup in place. Subsequent images show right hip arthroplasty in anatomic position. IMPRESSION: Intraoperative visualization of a right hip arthroplasty. Electronically Signed   By: Lorin Picket M.D.   On: 10/04/2015 14:14   Dg Hip Unilat With Pelvis 2-3 Views Right  Result Date: 10/03/2015 CLINICAL DATA:  Right hip pain status post fall. EXAM: DG HIP (WITH OR WITHOUT PELVIS) 2-3V RIGHT COMPARISON:  None. FINDINGS: There is an impacted subcapital right femoral neck fracture with minimal medial displacement of the distal fracture fragment. No other fractures are seen. Soft tissues are grossly normal. IMPRESSION: Impacted subcapital right femoral neck fracture. Electronically Signed   By: Fidela Salisbury M.D.   On: 10/03/2015 17:03    Subjective: PT without complaints, sitting up in chair, has been ambulating with PT, wants to go home with HHPT.   Discharge Exam: Vitals:   10/05/15 2030 10/06/15 0552  BP: 119/60 126/74  Pulse: 92 92  Resp: 16 16  Temp: 98.2 F (36.8 C)  98.6 F (37 C)   Vitals:   10/05/15 0856 10/05/15 1100 10/05/15 2030 10/06/15 0552  BP: 107/80 135/68 119/60 126/74  Pulse:  96 92 92  Resp:  16 16 16   Temp:  98.2 F (36.8 C) 98.2 F (36.8 C) 98.6 F (37 C)  TempSrc:  Oral Oral Oral  SpO2:  100% 98% 96%  Weight:      Height:       General exam:  Adult female.  No acute distress.  HEENT:  NCAT, MMM Respiratory system: Clear to auscultation bilaterally Cardiovascular system: Regular rate and rhythm, normal 99991111. 2/6 systolic murmur.  Warm extremities Gastrointestinal system: Normal active bowel sounds, soft, nondistended, nontender. MSK:  Normal tone and bulk, Swelling along the right lateral hip, TTP, no bruising.  Dressing c/d/i.  2+ pedal pulse Neuro:  Grossly intact.  Sensation intact to light touch right foot.  Able to wiggle toes on right foot.  The results of significant diagnostics from this hospitalization (including imaging, microbiology,  ancillary and laboratory) are listed below for reference.    Microbiology: Recent Results (from the past 240 hour(s))  Surgical pcr screen     Status: Abnormal   Collection Time: 10/04/15  5:11 AM  Result Value Ref Range Status   MRSA, PCR POSITIVE (A) NEGATIVE Final   Staphylococcus aureus POSITIVE (A) NEGATIVE Final    Comment:        The Xpert SA Assay (FDA approved for NASAL specimens in patients over 43 years of age), is one component of a comprehensive surveillance program.  Test performance has been validated by [facility] for patients greater than or equal to 47 year old. It is not intended to diagnose infection nor to guide or monitor treatment. RESULT CALLED TO, READ BACK BY AND VERIFIED WITH: R LIGHT,RN AT 0806 10/04/15 BY L BENFIELD     Labs: BNP (last 3 results) No results for input(s): BNP in the last 8760 hours. Basic Metabolic Panel:  Recent Labs Lab 10/03/15 1740 10/04/15 0305 10/05/15 0501 10/06/15 0506  NA 136 139 139 139  K 4.5 4.1 5.0 4.3  CL 101 105 106 106  CO2 27 26 27 28   GLUCOSE 97 96 93 91  BUN 11 9 6 7   CREATININE 1.01* 0.99 0.86 0.81  CALCIUM 9.6 9.1 8.7* 8.8*   Liver Function Tests: No results for input(s): AST, ALT, ALKPHOS, BILITOT, PROT, ALBUMIN in the last 168 hours. No results for input(s): LIPASE, AMYLASE in the last 168 hours. No results for input(s): AMMONIA in the last 168 hours. CBC:  Recent Labs Lab 10/03/15 1740 10/04/15 0305 10/05/15 0501 10/06/15 0506  WBC 9.0 5.2 7.0 6.7  NEUTROABS 6.2  --   --   --   HGB 10.8* 10.3* 9.0* 8.5*  HCT 34.3* 32.9* 28.4* 27.2*  MCV 94.5 95.4 95.0 95.1  PLT 415* 383 328 301   Cardiac Enzymes: No results for input(s): CKTOTAL, CKMB, CKMBINDEX, TROPONINI in the last 168 hours. BNP: Invalid input(s): POCBNP CBG: No results for input(s): GLUCAP in the last 168 hours. D-Dimer No results for input(s): DDIMER in the last 72 hours. Hgb A1c No results for input(s): HGBA1C in the  last 72 hours. Lipid Profile No results for input(s): CHOL, HDL, LDLCALC, TRIG, CHOLHDL, LDLDIRECT in the last 72 hours. Thyroid function studies No results for input(s): TSH, T4TOTAL, T3FREE, THYROIDAB in the last 72 hours.  Invalid input(s): FREET3 Anemia work up No results for input(s): VITAMINB12, FOLATE, FERRITIN, TIBC, IRON,  RETICCTPCT in the last 72 hours. Urinalysis    Component Value Date/Time   COLORURINE YELLOW 10/03/2015 1852   APPEARANCEUR CLEAR 10/03/2015 1852   LABSPEC 1.009 10/03/2015 1852   PHURINE 6.0 10/03/2015 1852   GLUCOSEU NEGATIVE 10/03/2015 1852   HGBUR NEGATIVE 10/03/2015 1852   BILIRUBINUR NEGATIVE 10/03/2015 1852   BILIRUBINUR small 11/16/2014 1136   KETONESUR NEGATIVE 10/03/2015 1852   PROTEINUR NEGATIVE 10/03/2015 1852   UROBILINOGEN negative 11/16/2014 1136   UROBILINOGEN 0.2 06/12/2013 0352   NITRITE NEGATIVE 10/03/2015 1852   LEUKOCYTESUR TRACE (A) 10/03/2015 1852   Sepsis Labs Invalid input(s): PROCALCITONIN,  WBC,  LACTICIDVEN Microbiology Recent Results (from the past 240 hour(s))  Surgical pcr screen     Status: Abnormal   Collection Time: 10/04/15  5:11 AM  Result Value Ref Range Status   MRSA, PCR POSITIVE (A) NEGATIVE Final   Staphylococcus aureus POSITIVE (A) NEGATIVE Final    Comment:        The Xpert SA Assay (FDA approved for NASAL specimens in patients over 33 years of age), is one component of a comprehensive surveillance program.  Test performance has been validated by [facility] for patients greater than or equal to 90 year old. It is not intended to diagnose infection nor to guide or monitor treatment. RESULT CALLED TO, READ BACK BY AND VERIFIED WITH: R LIGHT,RN AT 0806 10/04/15 BY L BENFIELD    Time coordinating discharge: 32 minutes  SIGNED:  Irwin Brakeman, MD  [facility] 10/06/2015, 10:00 AM Pager   If 7PM-7AM, please contact night-coverage [email or web address] Password [email or web address]

## 2015-10-06 NOTE — Clinical Social Work Note (Signed)
Pt is ready for discharge today. PT is recommending home health services. CSW discussed pt with RNCM. Pt will return home with home health services. RNCM is following for discharge planning needs. CSW is signing off as no further needs identified.   Darden Dates, MSW, LCSW  Clinical Social Worker  [phone]

## 2015-10-06 NOTE — Discharge Instructions (Signed)
°Dr. Brian Swinteck °Joint Replacement Specialist °[facility] °[address] °[place],  [address] °[phone] ° ° °TOTAL HIP REPLACEMENT POSTOPERATIVE DIRECTIONS ° ° ° °Hip Rehabilitation, Guidelines Following Surgery  ° °WEIGHT BEARING °Weight bearing as tolerated with assist device (walker, cane, etc) as directed, use it as long as suggested by your surgeon or therapist, typically at least 4-6 weeks. ° °The results of a hip operation are greatly improved after range of motion and muscle strengthening exercises. Follow all safety measures which are given to protect your hip. If any of these exercises cause increased pain or swelling in your joint, decrease the amount until you are comfortable again. Then slowly increase the exercises. Call your caregiver if you have problems or questions.  ° °HOME CARE INSTRUCTIONS  °Most of the following instructions are designed to prevent the dislocation of your new hip.  °Remove items at home which could result in a fall. This includes throw rugs or furniture in walking pathways.  °Continue medications as instructed at time of discharge. °· You may have some home medications which will be placed on hold until you complete the course of blood thinner medication. °· You may start showering once you are discharged home. Do not remove your dressing. °Do not put on socks or shoes without following the instructions of your caregivers.   °Sit on chairs with arms. Use the chair arms to help push yourself up when arising.  °Arrange for the use of a toilet seat elevator so you are not sitting low.  °· Walk with walker as instructed.  °You may resume a sexual relationship in one month or when given the OK by your caregiver.  °Use walker as long as suggested by your caregivers.  °You may put full weight on your legs and walk as much as is comfortable. °Avoid periods of inactivity such as sitting longer than an hour when not asleep. This helps prevent  blood clots.  °You may return to work once you are cleared by your surgeon.  °Do not drive a car for 6 weeks or until released by your surgeon.  °Do not drive while taking narcotics.  °Wear elastic stockings for two weeks following surgery during the day but you may remove then at night.  °Make sure you keep all of your appointments after your operation with all of your doctors and caregivers. You should call the office at the above phone number and make an appointment for approximately two weeks after the date of your surgery. °Please pick up a stool softener and laxative for home use as long as you are requiring pain medications. °· ICE to the affected hip every three hours for 30 minutes at a time and then as needed for pain and swelling. Continue to use ice on the hip for pain and swelling from surgery. You may notice swelling that will progress down to the foot and ankle.  This is normal after surgery.  Elevate the leg when you are not up walking on it.   °It is important for you to complete the blood thinner medication as prescribed by your doctor. °· Continue to use the breathing machine which will help keep your temperature down.  It is common for your temperature to cycle up and down following surgery, especially at night when you are not up moving around and exerting yourself.  The breathing machine keeps your lungs expanded and your temperature down. ° °RANGE OF MOTION AND STRENGTHENING EXERCISES  °These exercises are   designed to help you keep full movement of your hip joint. Follow your caregiver's or physical therapist's instructions. Perform all exercises about fifteen times, three times per day or as directed. Exercise both hips, even if you have had only one joint replacement. These exercises can be done on a training (exercise) mat, on the floor, on a table or on a bed. Use whatever works the best and is most comfortable for you. Use music or television while you are exercising so that the exercises  are a pleasant break in your day. This will make your life better with the exercises acting as a break in routine you can look forward to.  Lying on your back, slowly slide your foot toward your buttocks, raising your knee up off the floor. Then slowly slide your foot back down until your leg is straight again.  Lying on your back spread your legs as far apart as you can without causing discomfort.  Lying on your side, raise your upper leg and foot straight up from the floor as far as is comfortable. Slowly lower the leg and repeat.  Lying on your back, tighten up the muscle in the front of your thigh (quadriceps muscles). You can do this by keeping your leg straight and trying to raise your heel off the floor. This helps strengthen the largest muscle supporting your knee.  Lying on your back, tighten up the muscles of your buttocks both with the legs straight and with the knee bent at a comfortable angle while keeping your heel on the floor.   SKILLED REHAB INSTRUCTIONS: If the patient is transferred to a skilled rehab facility following release from the hospital, a list of the current medications will be sent to the facility for the patient to continue.  When discharged from the skilled rehab facility, please have the facility set up the patient's [place] prior to being released. Also, the skilled facility will be responsible for providing the patient with their medications at time of release from the facility to include their pain medication and their blood thinner medication. If the patient is still at the rehab facility at time of the two week follow up appointment, the skilled rehab facility will also need to assist the patient in arranging follow up appointment in our office and any transportation needs.  MAKE SURE YOU:  Understand these instructions.  Will watch your condition.  Will get help right away if you are not doing well or get worse.  Pick up stool softner and  laxative for home use following surgery while on pain medications. Do not remove your dressing. The dressing is waterproof--it is OK to take showers. Continue to use ice for pain and swelling after surgery. Do not use any lotions or creams on the incision until instructed by your surgeon. Total Hip Protocol.   Fall Prevention in the Home  Falls can cause injuries. They can happen to people of all ages. There are many things you can do to make your home safe and to help prevent falls.  WHAT CAN I DO ON THE OUTSIDE OF MY HOME?  Regularly fix the edges of walkways and driveways and fix any cracks.  Remove anything that might make you trip as you walk through a door, such as a raised step or threshold.  Trim any bushes or trees on the path to your home.  Use bright outdoor lighting.  Clear any walking paths of anything that might make someone trip, such as  rocks or tools.  Regularly check to see if handrails are loose or broken. Make sure that both sides of any steps have handrails.  Any raised decks and porches should have guardrails on the edges.  Have any leaves, snow, or ice cleared regularly.  Use sand or salt on walking paths during winter.  Clean up any spills in your garage right away. This includes oil or grease spills. WHAT CAN I DO IN THE BATHROOM?   Use night lights.  Install grab bars by the toilet and in the tub and shower. Do not use towel bars as grab bars.  Use non-skid mats or decals in the tub or shower.  If you need to sit down in the shower, use a plastic, non-slip stool.  Keep the floor dry. Clean up any water that spills on the floor as soon as it happens.  Remove soap buildup in the tub or shower regularly.  Attach bath mats securely with double-sided non-slip rug tape.  Do not have throw rugs and other things on the floor that can make you trip. WHAT CAN I DO IN THE BEDROOM?  Use night lights.  Make sure that you have a light by your bed that  is easy to reach.  Do not use any sheets or blankets that are too big for your bed. They should not hang down onto the floor.  Have a firm chair that has side arms. You can use this for support while you get dressed.  Do not have throw rugs and other things on the floor that can make you trip. WHAT CAN I DO IN THE KITCHEN?  Clean up any spills right away.  Avoid walking on wet floors.  Keep items that you use a lot in easy-to-reach places.  If you need to reach something above you, use a strong step stool that has a grab bar.  Keep electrical cords out of the way.  Do not use floor polish or wax that makes floors slippery. If you must use wax, use non-skid floor wax.  Do not have throw rugs and other things on the floor that can make you trip. WHAT CAN I DO WITH MY STAIRS?  Do not leave any items on the stairs.  Make sure that there are handrails on both sides of the stairs and use them. Fix handrails that are broken or loose. Make sure that handrails are as long as the stairways.  Check any carpeting to make sure that it is firmly attached to the stairs. Fix any carpet that is loose or worn.  Avoid having throw rugs at the top or bottom of the stairs. If you do have throw rugs, attach them to the floor with carpet tape.  Make sure that you have a light switch at the top of the stairs and the bottom of the stairs. If you do not have them, ask someone to add them for you. WHAT ELSE CAN I DO TO HELP PREVENT FALLS?  Wear shoes that:  Do not have high heels.  Have rubber bottoms.  Are comfortable and fit you well.  Are closed at the toe. Do not wear sandals.  If you use a stepladder:  Make sure that it is fully opened. Do not climb a closed stepladder.  Make sure that both sides of the stepladder are locked into place.  Ask someone to hold it for you, if possible.  Clearly mark and make sure that you can see:  Any grab bars or  handrails.  First and last  steps.  Where the edge of each step is.  Use tools that help you move around (mobility aids) if they are needed. These include:  Canes.  Walkers.  Scooters.  Crutches.  Turn on the lights when you go into a dark area. Replace any light bulbs as soon as they burn out.  Set up your furniture so you have a clear path. Avoid moving your furniture around.  If any of your floors are uneven, fix them.  If there are any pets around you, be aware of where they are.  Review your medicines with your doctor. Some medicines can make you feel dizzy. This can increase your chance of falling. Ask your doctor what other things that you can do to help prevent falls.   This information is not intended to replace advice given to you by your health care provider. Make sure you discuss any questions you have with your health care provider.   Document Released: 11/19/2008 Document Revised: 06/09/2014 Document Reviewed: 02/27/2014 Elsevier Interactive Patient Education [employer].

## 2015-10-07 DIAGNOSIS — K589 Irritable bowel syndrome without diarrhea: Secondary | ICD-10-CM | POA: Diagnosis not present

## 2015-10-07 DIAGNOSIS — E559 Vitamin D deficiency, unspecified: Secondary | ICD-10-CM | POA: Diagnosis not present

## 2015-10-07 DIAGNOSIS — I2510 Atherosclerotic heart disease of native coronary artery without angina pectoris: Secondary | ICD-10-CM | POA: Diagnosis not present

## 2015-10-07 DIAGNOSIS — K219 Gastro-esophageal reflux disease without esophagitis: Secondary | ICD-10-CM | POA: Diagnosis not present

## 2015-10-07 DIAGNOSIS — Z0289 Encounter for other administrative examinations: Secondary | ICD-10-CM

## 2015-10-07 DIAGNOSIS — Z96641 Presence of right artificial hip joint: Secondary | ICD-10-CM | POA: Diagnosis not present

## 2015-10-07 DIAGNOSIS — Z7982 Long term (current) use of aspirin: Secondary | ICD-10-CM | POA: Diagnosis not present

## 2015-10-07 DIAGNOSIS — Z471 Aftercare following joint replacement surgery: Secondary | ICD-10-CM | POA: Diagnosis not present

## 2015-10-07 DIAGNOSIS — Z859 Personal history of malignant neoplasm, unspecified: Secondary | ICD-10-CM | POA: Diagnosis not present

## 2015-10-07 DIAGNOSIS — E785 Hyperlipidemia, unspecified: Secondary | ICD-10-CM | POA: Diagnosis not present

## 2015-10-07 DIAGNOSIS — I10 Essential (primary) hypertension: Secondary | ICD-10-CM | POA: Diagnosis not present

## 2015-10-08 DIAGNOSIS — I10 Essential (primary) hypertension: Secondary | ICD-10-CM | POA: Diagnosis not present

## 2015-10-08 DIAGNOSIS — K589 Irritable bowel syndrome without diarrhea: Secondary | ICD-10-CM | POA: Diagnosis not present

## 2015-10-08 DIAGNOSIS — Z859 Personal history of malignant neoplasm, unspecified: Secondary | ICD-10-CM | POA: Diagnosis not present

## 2015-10-08 DIAGNOSIS — Z7982 Long term (current) use of aspirin: Secondary | ICD-10-CM | POA: Diagnosis not present

## 2015-10-08 DIAGNOSIS — E785 Hyperlipidemia, unspecified: Secondary | ICD-10-CM | POA: Diagnosis not present

## 2015-10-08 DIAGNOSIS — I2510 Atherosclerotic heart disease of native coronary artery without angina pectoris: Secondary | ICD-10-CM | POA: Diagnosis not present

## 2015-10-08 DIAGNOSIS — K219 Gastro-esophageal reflux disease without esophagitis: Secondary | ICD-10-CM | POA: Diagnosis not present

## 2015-10-08 DIAGNOSIS — Z96641 Presence of right artificial hip joint: Secondary | ICD-10-CM | POA: Diagnosis not present

## 2015-10-08 DIAGNOSIS — Z471 Aftercare following joint replacement surgery: Secondary | ICD-10-CM | POA: Diagnosis not present

## 2015-10-08 DIAGNOSIS — E559 Vitamin D deficiency, unspecified: Secondary | ICD-10-CM | POA: Diagnosis not present

## 2015-10-12 DIAGNOSIS — Z7982 Long term (current) use of aspirin: Secondary | ICD-10-CM | POA: Diagnosis not present

## 2015-10-12 DIAGNOSIS — E559 Vitamin D deficiency, unspecified: Secondary | ICD-10-CM | POA: Diagnosis not present

## 2015-10-12 DIAGNOSIS — Z96641 Presence of right artificial hip joint: Secondary | ICD-10-CM | POA: Diagnosis not present

## 2015-10-12 DIAGNOSIS — I2510 Atherosclerotic heart disease of native coronary artery without angina pectoris: Secondary | ICD-10-CM | POA: Diagnosis not present

## 2015-10-12 DIAGNOSIS — K589 Irritable bowel syndrome without diarrhea: Secondary | ICD-10-CM | POA: Diagnosis not present

## 2015-10-12 DIAGNOSIS — E785 Hyperlipidemia, unspecified: Secondary | ICD-10-CM | POA: Diagnosis not present

## 2015-10-12 DIAGNOSIS — Z471 Aftercare following joint replacement surgery: Secondary | ICD-10-CM | POA: Diagnosis not present

## 2015-10-12 DIAGNOSIS — Z859 Personal history of malignant neoplasm, unspecified: Secondary | ICD-10-CM | POA: Diagnosis not present

## 2015-10-12 DIAGNOSIS — I10 Essential (primary) hypertension: Secondary | ICD-10-CM | POA: Diagnosis not present

## 2015-10-12 DIAGNOSIS — K219 Gastro-esophageal reflux disease without esophagitis: Secondary | ICD-10-CM | POA: Diagnosis not present

## 2015-10-13 DIAGNOSIS — Z7982 Long term (current) use of aspirin: Secondary | ICD-10-CM | POA: Diagnosis not present

## 2015-10-13 DIAGNOSIS — E785 Hyperlipidemia, unspecified: Secondary | ICD-10-CM | POA: Diagnosis not present

## 2015-10-13 DIAGNOSIS — K589 Irritable bowel syndrome without diarrhea: Secondary | ICD-10-CM | POA: Diagnosis not present

## 2015-10-13 DIAGNOSIS — K219 Gastro-esophageal reflux disease without esophagitis: Secondary | ICD-10-CM | POA: Diagnosis not present

## 2015-10-13 DIAGNOSIS — E559 Vitamin D deficiency, unspecified: Secondary | ICD-10-CM | POA: Diagnosis not present

## 2015-10-13 DIAGNOSIS — Z96641 Presence of right artificial hip joint: Secondary | ICD-10-CM | POA: Diagnosis not present

## 2015-10-13 DIAGNOSIS — Z471 Aftercare following joint replacement surgery: Secondary | ICD-10-CM | POA: Diagnosis not present

## 2015-10-13 DIAGNOSIS — Z859 Personal history of malignant neoplasm, unspecified: Secondary | ICD-10-CM | POA: Diagnosis not present

## 2015-10-13 DIAGNOSIS — I2510 Atherosclerotic heart disease of native coronary artery without angina pectoris: Secondary | ICD-10-CM | POA: Diagnosis not present

## 2015-10-13 DIAGNOSIS — I10 Essential (primary) hypertension: Secondary | ICD-10-CM | POA: Diagnosis not present

## 2015-10-15 DIAGNOSIS — E559 Vitamin D deficiency, unspecified: Secondary | ICD-10-CM | POA: Diagnosis not present

## 2015-10-15 DIAGNOSIS — Z7982 Long term (current) use of aspirin: Secondary | ICD-10-CM | POA: Diagnosis not present

## 2015-10-15 DIAGNOSIS — I10 Essential (primary) hypertension: Secondary | ICD-10-CM | POA: Diagnosis not present

## 2015-10-15 DIAGNOSIS — Z859 Personal history of malignant neoplasm, unspecified: Secondary | ICD-10-CM | POA: Diagnosis not present

## 2015-10-15 DIAGNOSIS — K219 Gastro-esophageal reflux disease without esophagitis: Secondary | ICD-10-CM | POA: Diagnosis not present

## 2015-10-15 DIAGNOSIS — E785 Hyperlipidemia, unspecified: Secondary | ICD-10-CM | POA: Diagnosis not present

## 2015-10-15 DIAGNOSIS — I2510 Atherosclerotic heart disease of native coronary artery without angina pectoris: Secondary | ICD-10-CM | POA: Diagnosis not present

## 2015-10-15 DIAGNOSIS — Z96641 Presence of right artificial hip joint: Secondary | ICD-10-CM | POA: Diagnosis not present

## 2015-10-15 DIAGNOSIS — K589 Irritable bowel syndrome without diarrhea: Secondary | ICD-10-CM | POA: Diagnosis not present

## 2015-10-15 DIAGNOSIS — Z471 Aftercare following joint replacement surgery: Secondary | ICD-10-CM | POA: Diagnosis not present

## 2015-10-18 DIAGNOSIS — K219 Gastro-esophageal reflux disease without esophagitis: Secondary | ICD-10-CM | POA: Diagnosis not present

## 2015-10-18 DIAGNOSIS — Z471 Aftercare following joint replacement surgery: Secondary | ICD-10-CM | POA: Diagnosis not present

## 2015-10-18 DIAGNOSIS — I2510 Atherosclerotic heart disease of native coronary artery without angina pectoris: Secondary | ICD-10-CM | POA: Diagnosis not present

## 2015-10-18 DIAGNOSIS — K589 Irritable bowel syndrome without diarrhea: Secondary | ICD-10-CM | POA: Diagnosis not present

## 2015-10-18 DIAGNOSIS — E559 Vitamin D deficiency, unspecified: Secondary | ICD-10-CM | POA: Diagnosis not present

## 2015-10-18 DIAGNOSIS — E785 Hyperlipidemia, unspecified: Secondary | ICD-10-CM | POA: Diagnosis not present

## 2015-10-18 DIAGNOSIS — I10 Essential (primary) hypertension: Secondary | ICD-10-CM | POA: Diagnosis not present

## 2015-10-18 DIAGNOSIS — Z859 Personal history of malignant neoplasm, unspecified: Secondary | ICD-10-CM | POA: Diagnosis not present

## 2015-10-18 DIAGNOSIS — Z7982 Long term (current) use of aspirin: Secondary | ICD-10-CM | POA: Diagnosis not present

## 2015-10-18 DIAGNOSIS — Z96641 Presence of right artificial hip joint: Secondary | ICD-10-CM | POA: Diagnosis not present

## 2015-10-19 DIAGNOSIS — Z471 Aftercare following joint replacement surgery: Secondary | ICD-10-CM | POA: Diagnosis not present

## 2015-10-19 DIAGNOSIS — Z96641 Presence of right artificial hip joint: Secondary | ICD-10-CM | POA: Diagnosis not present

## 2015-10-19 NOTE — Progress Notes (Signed)
Cardiology Office Note   Date:  10/20/2015   ID:  Amy Black, DOB Feb 06, 1941, MRN [id number]  PCP:  [name], MD  Cardiologist:   Minus Breeding, MD   Chief Complaint  Patient presents with  . Chest Pain    History of Present Illness: Amy Black is a 75 y.o. female she has a history of coronary spasm presumed. Since I last saw her she was in the hospital last month with a closed hip fracture after a fall.  I reviewed these hospital records and it does not appear that there were any acute cardiac issues.  She is doing rehab.  The patient denies any new symptoms such shortness of breath, PND or orthopnea. There have been no reported palpitations, presyncope or syncope.  She rarely gets chest pain and this is in a somewhat sporadic fashion. She rarely takes nitroglycerin and this has been unchanged over the past many months. She can do her rehabilitation without bringing on discomfort. Discomfort seems to be mild sometimes radiating around her back sometimes her left side chest. She does not have classic substernal chest discomfort or anginal equivalent.   Past Medical History:  Diagnosis Date  . Adenocarcinoma (HCC)    LEFT LEG  . Adenocarcinoma of unknown primary ([place]) 02/06/2011  . 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 Allergy   . Anxiety   . CAD (coronary artery disease)   . Cataract    bilateral cataracts removed  . Coronary artery spasm ([place])   . Diverticulosis of colon (without mention of hemorrhage)   . GERD (gastroesophageal reflux disease)   . hepatic cyst   . History of cardiac catheterization   . History of nuclear stress test 04/2011   lexiscan; normal study, no significant ischemia, low risk   . HTN (hypertension)    PT. DENIES  . Hyperlipidemia   . Insomnia   . Irritable bowel syndrome   . Transverse myelitis ([place])   . Unstable angina ([place])   . Vitamin D deficiency     Past Surgical History:  Procedure  Laterality Date  . ABDOMINAL HYSTERECTOMY    . CARDIAC CATHETERIZATION     coronary spasm  . CARDIAC CATHETERIZATION N/A 08/28/2014   Procedure: Left Heart Cath and Coronary Angiography;  Surgeon: Jettie Booze, MD;  Location: [facility];  Service: Cardiovascular;  Laterality: N/A;  . COLONOSCOPY    . DIAGNOSTIC LAPAROSCOPY    . LYSIS OF ADHESION    . PELVIC LAPAROSCOPY  1992   RSO, AND LSO ON 2007  . RESECTION SOFT TISSUE TUMOR LEG / ANKLE RADICAL  2004   leg lesion resection & radiation  . SALPINGOOPHORECTOMY Left   . TOTAL HIP ARTHROPLASTY Right 10/04/2015   Procedure: TOTAL HIP ARTHROPLASTY ANTERIOR APPROACH;  Surgeon: Rod Can, MD;  Location: [facility];  Service: Orthopedics;  Laterality: Right;  . TRANSTHORACIC ECHOCARDIOGRAM  07/2012   LV cavity size mildly reduced, normal wall motion; MV with calcified annulus and mild MR; LA mildly dilated; atrial septum with increased thickness - lipomatous hypertrophy; RV systolic pressure increased (borderline pulm HTN)  . UPPER GASTROINTESTINAL ENDOSCOPY       Current Outpatient Prescriptions  Medication Sig Dispense Refill  . ALPRAZolam (XANAX) 0.5 MG tablet TAKE ONE TABLET BY MOUTH THREE TIMES DAILY 90 tablet 1  . aspirin 81 MG tablet Take 1 tablet (81 mg total) by mouth 2 (two) times daily with a meal.  30 tablet   . cetirizine (ZYRTEC) 10 MG tablet Take 1 tablet (10 mg total) by mouth 2 (two) times daily. (Patient taking differently: Take 10 mg by mouth as needed for allergies. ) 30 tablet 0  . docusate sodium (COLACE) 100 MG capsule Take 1 capsule (100 mg total) by mouth 2 (two) times daily. 60 capsule 0  . ferrous sulfate 325 (65 FE) MG tablet Take 1 tablet (325 mg total) by mouth daily with breakfast. 30 tablet 3  . fluticasone (FLONASE) 50 MCG/ACT nasal spray Place 2 sprays into both nostrils daily. (Patient taking differently: Place 2 sprays into both nostrils daily as needed for allergies. ) 16 g 6  . hydrocortisone  (ANUSOL-HC) 2.5 % rectal cream Apply rectally 2 times daily as needed (Patient taking differently: Place 1 application rectally. Apply rectally 2 times daily as needed) 28.35 g 0  . isosorbide mononitrate (IMDUR) 30 MG 24 hr tablet TAKE 1/2 TABLET BY MOUTH DAILY. (Patient taking differently: TAKE 1/2 TABLET (15 mg) BY MOUTH DAILY.) 15 tablet 11  . loperamide (IMODIUM) 2 MG capsule TAKE ONE CAPSULE BY MOUTH FOUR TIMES DAILY AS NEEDED FOR DIARRHEA / LOOSE STOOLS (Patient taking differently: Take 2 mg by mouth 4 (four) times daily as needed for diarrhea or loose stools. ) 120 capsule 3  . NITROSTAT 0.4 MG SL tablet DISSOLVE ONE TABLET UNDER TONGUE EVERY 5 MINUTES UP TO 3 DOSES AS NEEDED FOR CHEST PAIN (Patient taking differently: DISSOLVE ONE TABLET (0.4 mg) UNDER TONGUE EVERY 5 MINUTES UP TO 3 DOSES AS NEEDED FOR CHEST PAIN) 25 tablet 1  . pantoprazole (PROTONIX) 40 MG tablet Take 1 tablet (40 mg total) by mouth 2 (two) times daily. KEEP OV. 180 tablet 0  . traMADol (ULTRAM) 50 MG tablet TAKE ONE TABLET BY MOUTH EVERY TWELVE HOURS AS NEEDED. (Patient taking differently: TAKE ONE TABLET (50 mg) BY MOUTH EVERY TWELVE HOURS AS NEEDED for pain) 60 tablet 3  . triamterene-hydrochlorothiazide (DYAZIDE) 37.5-25 MG capsule Take 1 each (1 capsule total) by mouth daily. (Patient taking differently: Take 1 capsule by mouth every Monday, Wednesday, and Friday. )    . verapamil (CALAN) 80 MG tablet TAKE ONE TABLET BY MOUTH FOUR TIMES DAILY (Patient taking differently: TAKE ONE TABLET (80 mg) BY MOUTH FOUR TIMES DAILY) 115 tablet 12  . Vitamin D, Ergocalciferol, (DRISDOL) 50000 units CAPS capsule Take 50,000 Units by mouth every Monday.  0   No current facility-administered medications for this visit.     Allergies:   Iodine; Cymbalta [duloxetine hcl]; Gabapentin; Ivp dye [iodinated diagnostic agents]; and Zanaflex [tizanidine hcl]    ROS:  Please see the history of present illness.   Otherwise, review of systems  are positive none.   All other systems are reviewed and negative.    PHYSICAL EXAM: VS:  BP 118/70   Pulse 72   Ht 5\' 3"  (1.6 m)   Wt 160 lb (72.6 kg)   BMI 28.34 kg/m  , BMI Body mass index is 28.34 kg/m. GENERAL:  Well appearing NECK:  No jugular venous distention, waveform within normal limits, carotid upstroke brisk and symmetric, no bruits, no thyromegaly LUNGS:  Clear to auscultation bilaterally CHEST:  Unremarkable HEART:  PMI not displaced or sustained,S1 and S2 within normal limits, no S3, no S4, no clicks, no rubs, brief apical non radiating systolic murmur ABD:  Flat, positive bowel sounds normal in frequency in pitch, no bruits, no rebound, no guarding, no midline pulsatile mass, no hepatomegaly,  no splenomegaly EXT:  2 plus pulses throughout, mild right leg edema, no cyanosis no clubbing, right wrist catheterization site unremarkable   EKG:  EKG is not ordered today.   Recent Labs: 06/26/2015: TSH 2.010 07/23/2015: ALT 25 10/06/2015: BUN 7; Creatinine, Ser 0.81; Hemoglobin 8.5; Platelets 301; Potassium 4.3; Sodium 139     Wt Readings from Last 3 Encounters:  10/20/15 160 lb (72.6 kg)  10/03/15 160 lb (72.6 kg)  08/12/15 158 lb (71.7 kg)      Other studies Reviewed: Additional studies/ records that were reviewed today include: Hospital records Review of the above records demonstrates:  Please see elsewhere in the note.     ASSESSMENT AND PLAN:  CHEST PAIN:     She has rare symptoms. She had no fixed coronary disease. She will continue on verapamil for presumed coronary vasospasm.  PALPITATIONS:  These are infrequent. No change in therapy is indicated.  HTN:  The blood pressure is at target. No change in medications is indicated. We will continue with therapeutic lifestyle changes (TLC).   Current medicines are reviewed at length with the patient today.  The patient does not have concerns regarding medicines.  The following changes have been made:   none  Labs/ tests ordered today include: none No orders of the defined types were placed in this encounter.    Disposition:   FU with me in 45months.     Signed, Minus Breeding, MD  10/20/2015 3:31 PM    [facility]

## 2015-10-20 ENCOUNTER — Encounter: Payer: Self-pay | Admitting: Cardiology

## 2015-10-20 ENCOUNTER — Ambulatory Visit (INDEPENDENT_AMBULATORY_CARE_PROVIDER_SITE_OTHER): Payer: Medicare Other | Admitting: Cardiology

## 2015-10-20 VITALS — BP 118/70 | HR 72 | Ht 63.0 in | Wt 160.0 lb

## 2015-10-20 DIAGNOSIS — I10 Essential (primary) hypertension: Secondary | ICD-10-CM

## 2015-10-20 DIAGNOSIS — R072 Precordial pain: Secondary | ICD-10-CM

## 2015-10-20 NOTE — Patient Instructions (Signed)

## 2015-10-22 ENCOUNTER — Encounter: Payer: Self-pay | Admitting: Family Medicine

## 2015-10-22 ENCOUNTER — Ambulatory Visit (INDEPENDENT_AMBULATORY_CARE_PROVIDER_SITE_OTHER): Payer: Medicare Other | Admitting: Family Medicine

## 2015-10-22 VITALS — BP 132/65 | HR 53 | Temp 97.8°F | Ht 63.0 in | Wt 154.4 lb

## 2015-10-22 DIAGNOSIS — S72001D Fracture of unspecified part of neck of right femur, subsequent encounter for closed fracture with routine healing: Secondary | ICD-10-CM

## 2015-10-22 DIAGNOSIS — I201 Angina pectoris with documented spasm: Secondary | ICD-10-CM

## 2015-10-22 DIAGNOSIS — I10 Essential (primary) hypertension: Secondary | ICD-10-CM

## 2015-10-22 DIAGNOSIS — F419 Anxiety disorder, unspecified: Secondary | ICD-10-CM

## 2015-10-22 MED ORDER — ALPRAZOLAM 0.5 MG PO TABS: 0.5000 mg | ORAL_TABLET | Freq: Three times a day (TID) | ORAL | 1 refills | Status: DC

## 2015-10-22 NOTE — Progress Notes (Signed)
Subjective:  Patient ID: Amy Black, female    DOB: 02/10/1940  Age: 75 y.o. MRN: [id number]  CC: Hypertension (pt here for routine follow up and hip replacement 8/28 after a fall.)   HPI Amy Black presents for  follow-up of hypertension. Patient has no history of headache chest pain or shortness of breath or recent cough. Patient also denies symptoms of TIA such as numbness weakness lateralizing. Patient checks  blood pressure at home and has not had any elevated readings recently. Patient denies side effects from his medication. States taking it regularly.    History Amy Black has a past medical history of Adenocarcinoma ([place]); Adenocarcinoma of unknown primary ([place]) (02/06/2011); Adenosquamous carcinoma; Allergy; Anxiety; CAD (coronary artery disease); Cataract; Coronary artery spasm ([place]); Diverticulosis of colon (without mention of hemorrhage); GERD (gastroesophageal reflux disease); hepatic cyst; History of cardiac catheterization; History of nuclear stress test (04/2011); HTN (hypertension); Hyperlipidemia; Insomnia; Irritable bowel syndrome; Transverse myelitis ([place]); Unstable angina (HCC); and Vitamin D deficiency.   She has a past surgical history that includes Resection soft tissue tumor leg / ankle radical (2004); Diagnostic laparoscopy; Lysis of adhesion; Salpingoophorectomy (Left); Abdominal hysterectomy; Pelvic laparoscopy (1992); Cardiac catheterization; transthoracic echocardiogram (07/2012); Cardiac catheterization (N/A, 08/28/2014); Colonoscopy; Upper gastrointestinal endoscopy; and Total hip arthroplasty (Right, 10/04/2015).   Her family history includes Bladder Cancer in her mother; Heart disease in her father and mother; Hypertension in her mother; Stroke in her father.She reports that she quit smoking about 42 years ago. Her smoking use included Cigarettes. She started smoking about 46 years ago. She has a 2.00 pack-year smoking history. She has never used smokeless  tobacco. She reports that she does not drink alcohol or use drugs.    ROS Review of Systems  Constitutional: Negative for activity change, appetite change and fever.  HENT: Negative for congestion, rhinorrhea and sore throat.   Eyes: Negative for visual disturbance.  Respiratory: Negative for cough and shortness of breath.   Cardiovascular: Negative for chest pain and palpitations.  Gastrointestinal: Negative for abdominal pain, diarrhea and nausea.  Genitourinary: Negative for dysuria.  Musculoskeletal: Negative for arthralgias and myalgias.    Objective:  BP 132/65   Pulse (!) 53   Temp 97.8 F (36.6 C) (Oral)   Ht 5\' 3"  (1.6 m)   Wt 154 lb 6 oz (70 kg)   BMI 27.35 kg/m   BP Readings from Last 3 Encounters:  10/22/15 132/65  10/20/15 118/70  10/06/15 126/74    Wt Readings from Last 3 Encounters:  10/22/15 154 lb 6 oz (70 kg)  10/20/15 160 lb (72.6 kg)  10/03/15 160 lb (72.6 kg)     Physical Exam  Constitutional: She is oriented to person, place, and time. She appears well-developed and well-nourished. No distress.  HENT:  Head: Normocephalic and atraumatic.  Right Ear: External ear normal.  Left Ear: External ear normal.  Nose: Nose normal.  Mouth/Throat: Oropharynx is clear and moist.  Eyes: Conjunctivae and EOM are normal. Pupils are equal, round, and reactive to light.  Neck: Normal range of motion. Neck supple. No thyromegaly present.  Cardiovascular: Normal rate, regular rhythm and normal heart sounds.   No murmur heard. Pulmonary/Chest: Effort normal and breath sounds normal. No respiratory distress. She has no wheezes. She has no rales.  Abdominal: Soft. Bowel sounds are normal. She exhibits no distension. There is no tenderness.  Lymphadenopathy:    She has no cervical adenopathy.  Neurological: She is alert and oriented to person, place,  and time. She has normal reflexes.  Skin: Skin is warm and dry.  Psychiatric: She has a normal mood and affect.  Her behavior is normal. Judgment and thought content normal.     Lab Results  Component Value Date   WBC 6.7 10/06/2015   HGB 8.5 (L) 10/06/2015   HCT 27.2 (L) 10/06/2015   PLT 301 10/06/2015   GLUCOSE 91 10/06/2015   CHOL 213 (H) 06/26/2015   TRIG 73 06/26/2015   HDL 86 06/26/2015   LDLCALC 112 (H) 06/26/2015   ALT 25 07/23/2015   AST 29 07/23/2015   NA 139 10/06/2015   K 4.3 10/06/2015   CL 106 10/06/2015   CREATININE 0.81 10/06/2015   BUN 7 10/06/2015   CO2 28 10/06/2015   TSH 2.010 06/26/2015   INR 1.13 10/03/2015    Dg Wrist Complete Right  Result Date: 10/03/2015 CLINICAL DATA:  Wrist pain after falling today.  Initial encounter. EXAM: RIGHT WRIST - COMPLETE 3+ VIEW COMPARISON:  None. FINDINGS: The mineralization and alignment are normal. There is no evidence of acute fracture or dislocation. There are minimal degenerative changes for age. There is an ulnar minus variance at the wrist. No focal soft tissue abnormalities are seen. IMPRESSION: No evidence of acute fracture or dislocation. Electronically Signed   By: Richardean Sale M.D.   On: 10/03/2015 17:04   Ct Wrist Right Wo Contrast  Result Date: 10/03/2015 CLINICAL DATA:  Patient fell and caught herself in the right wrist. Pain in the distal radius with possible fracture. EXAM: CT OF THE RIGHT WRIST WITHOUT CONTRAST TECHNIQUE: Multidetector CT imaging of the right wrist was performed according to the standard protocol. Multiplanar CT image reconstructions were also generated. COMPARISON:  Right wrist radiographs 10/03/2015 FINDINGS: Bones/Joint/Cartilage Carpal bones, visualized metacarpal bones, and visualized distal radius and ulna appear intact. No acute fractures are demonstrated. MRI would be more sensitive for detection of bone bruises or other bony injury if clinically indicated. Tiny amount of gas demonstrated in the lunate toe capitate joint, likely degenerative. No focal bone lesion or bone destruction. Ligaments  Suboptimally assessed by CT. Muscles and Tendons No intramuscular hematoma or fluid collection demonstrated. Soft tissues No significant soft tissue infiltration or swelling. IMPRESSION: No acute fracture or dislocation demonstrated in the wrist. Mild degenerative changes are present. Electronically Signed   By: Lucienne Capers M.D.   On: 10/03/2015 21:22   Pelvis Portable  Result Date: 10/04/2015 CLINICAL DATA:  Status post right hip arthroplasty. EXAM: PORTABLE PELVIS 1-2 VIEWS COMPARISON:  Same day. FINDINGS: The femoral and acetabular components appear to be well situated. No fracture or dislocation is noted. Expected postoperative changes are seen in the surrounding soft tissues. IMPRESSION: Status post right total hip arthroplasty. Electronically Signed   By: Marijo Conception, M.D.   On: 10/04/2015 15:49   Dg Knee Right Port  Result Date: 10/03/2015 CLINICAL DATA:  Femoral neck fracture. EXAM: PORTABLE RIGHT KNEE - 1-2 VIEW COMPARISON:  None. FINDINGS: No evidence of fracture, dislocation, or joint effusion. There are 3 compartment osteoarthritic changes of the right knee, worse at the patellofemoral compartment. Well corticated calcific density anterior to the distal femur may represent calcific tendinitis or intracapsular loose body. Soft tissues are otherwise unremarkable. IMPRESSION: No acute fracture or dislocation identified about the right Knee. Three compartment osteoarthritis, worse at the patellofemoral compartment. Electronically Signed   By: Fidela Salisbury M.D.   On: 10/03/2015 18:45   Dg C-arm 1-60 Min  Result Date:  10/04/2015 CLINICAL DATA:  Right follow-up arthroplasty, anterior approach. EXAM: DG C-ARM 61-120 MIN; OPERATIVE RIGHT HIP WITH PELVIS COMPARISON:  10/03/2015. FINDINGS: Three intraoperative fluoroscopic spot views of the right hip initially show a reamer in the proximal right femur with the acetabular cup in place. Subsequent images show right hip arthroplasty in  anatomic position. IMPRESSION: Intraoperative visualization of a right hip arthroplasty. Electronically Signed   By: Lorin Picket M.D.   On: 10/04/2015 14:14   Dg Hip Operative Unilat W Or W/o Pelvis Right  Result Date: 10/04/2015 CLINICAL DATA:  Right follow-up arthroplasty, anterior approach. EXAM: DG C-ARM 61-120 MIN; OPERATIVE RIGHT HIP WITH PELVIS COMPARISON:  10/03/2015. FINDINGS: Three intraoperative fluoroscopic spot views of the right hip initially show a reamer in the proximal right femur with the acetabular cup in place. Subsequent images show right hip arthroplasty in anatomic position. IMPRESSION: Intraoperative visualization of a right hip arthroplasty. Electronically Signed   By: Lorin Picket M.D.   On: 10/04/2015 14:14   Dg Hip Unilat With Pelvis 2-3 Views Right  Result Date: 10/03/2015 CLINICAL DATA:  Right hip pain status post fall. EXAM: DG HIP (WITH OR WITHOUT PELVIS) 2-3V RIGHT COMPARISON:  None. FINDINGS: There is an impacted subcapital right femoral neck fracture with minimal medial displacement of the distal fracture fragment. No other fractures are seen. Soft tissues are grossly normal. IMPRESSION: Impacted subcapital right femoral neck fracture. Electronically Signed   By: Fidela Salisbury M.D.   On: 10/03/2015 17:03    Assessment & Plan:   Amy Black was seen today for hypertension.  Diagnoses and all orders for this visit:  Closed right hip fracture, with routine healing, subsequent encounter -     DG Bone Density; Future  Anxiety  Coronary artery spasm (HCC)  Essential hypertension  Other orders -     ALPRAZolam (XANAX) 0.5 MG tablet; Take 1 tablet (0.5 mg total) by mouth 3 (three) times daily.      I have discontinued Amy Black's hydrocortisone. I have also changed her ALPRAZolam. Additionally, I am having her maintain her cetirizine, fluticasone, triamterene-hydrochlorothiazide, NITROSTAT, verapamil, traMADol, pantoprazole, isosorbide  mononitrate, loperamide, Vitamin D (Ergocalciferol), aspirin, docusate sodium, and ferrous sulfate.  Meds ordered this encounter  Medications  . ALPRAZolam (XANAX) 0.5 MG tablet    Sig: Take 1 tablet (0.5 mg total) by mouth 3 (three) times daily.    Dispense:  90 tablet    Refill:  1     Follow-up: Return in about 3 months (around 01/21/2016).  [name], M.D.

## 2015-10-24 DIAGNOSIS — I2510 Atherosclerotic heart disease of native coronary artery without angina pectoris: Secondary | ICD-10-CM | POA: Diagnosis not present

## 2015-10-24 DIAGNOSIS — Z859 Personal history of malignant neoplasm, unspecified: Secondary | ICD-10-CM | POA: Diagnosis not present

## 2015-10-24 DIAGNOSIS — E785 Hyperlipidemia, unspecified: Secondary | ICD-10-CM | POA: Diagnosis not present

## 2015-10-24 DIAGNOSIS — K219 Gastro-esophageal reflux disease without esophagitis: Secondary | ICD-10-CM | POA: Diagnosis not present

## 2015-10-24 DIAGNOSIS — K589 Irritable bowel syndrome without diarrhea: Secondary | ICD-10-CM | POA: Diagnosis not present

## 2015-10-24 DIAGNOSIS — Z96641 Presence of right artificial hip joint: Secondary | ICD-10-CM | POA: Diagnosis not present

## 2015-10-24 DIAGNOSIS — Z471 Aftercare following joint replacement surgery: Secondary | ICD-10-CM | POA: Diagnosis not present

## 2015-10-24 DIAGNOSIS — Z7982 Long term (current) use of aspirin: Secondary | ICD-10-CM | POA: Diagnosis not present

## 2015-10-24 DIAGNOSIS — E559 Vitamin D deficiency, unspecified: Secondary | ICD-10-CM | POA: Diagnosis not present

## 2015-10-24 DIAGNOSIS — I10 Essential (primary) hypertension: Secondary | ICD-10-CM | POA: Diagnosis not present

## 2015-10-25 DIAGNOSIS — Z859 Personal history of malignant neoplasm, unspecified: Secondary | ICD-10-CM | POA: Diagnosis not present

## 2015-10-25 DIAGNOSIS — E785 Hyperlipidemia, unspecified: Secondary | ICD-10-CM | POA: Diagnosis not present

## 2015-10-25 DIAGNOSIS — E559 Vitamin D deficiency, unspecified: Secondary | ICD-10-CM | POA: Diagnosis not present

## 2015-10-25 DIAGNOSIS — Z471 Aftercare following joint replacement surgery: Secondary | ICD-10-CM | POA: Diagnosis not present

## 2015-10-25 DIAGNOSIS — Z96641 Presence of right artificial hip joint: Secondary | ICD-10-CM | POA: Diagnosis not present

## 2015-10-25 DIAGNOSIS — K219 Gastro-esophageal reflux disease without esophagitis: Secondary | ICD-10-CM | POA: Diagnosis not present

## 2015-10-25 DIAGNOSIS — K589 Irritable bowel syndrome without diarrhea: Secondary | ICD-10-CM | POA: Diagnosis not present

## 2015-10-25 DIAGNOSIS — Z7982 Long term (current) use of aspirin: Secondary | ICD-10-CM | POA: Diagnosis not present

## 2015-10-25 DIAGNOSIS — I10 Essential (primary) hypertension: Secondary | ICD-10-CM | POA: Diagnosis not present

## 2015-10-25 DIAGNOSIS — I2510 Atherosclerotic heart disease of native coronary artery without angina pectoris: Secondary | ICD-10-CM | POA: Diagnosis not present

## 2015-10-26 ENCOUNTER — Telehealth: Payer: Self-pay | Admitting: Family Medicine

## 2015-10-26 NOTE — Telephone Encounter (Signed)
Patient aware that the surgeon who done hip replacement would have to prescribe that medication.  Patient verbalized understanding.

## 2015-10-28 DIAGNOSIS — I10 Essential (primary) hypertension: Secondary | ICD-10-CM | POA: Diagnosis not present

## 2015-10-28 DIAGNOSIS — Z471 Aftercare following joint replacement surgery: Secondary | ICD-10-CM | POA: Diagnosis not present

## 2015-10-28 DIAGNOSIS — K589 Irritable bowel syndrome without diarrhea: Secondary | ICD-10-CM | POA: Diagnosis not present

## 2015-10-28 DIAGNOSIS — Z859 Personal history of malignant neoplasm, unspecified: Secondary | ICD-10-CM | POA: Diagnosis not present

## 2015-10-28 DIAGNOSIS — K219 Gastro-esophageal reflux disease without esophagitis: Secondary | ICD-10-CM | POA: Diagnosis not present

## 2015-10-28 DIAGNOSIS — Z96641 Presence of right artificial hip joint: Secondary | ICD-10-CM | POA: Diagnosis not present

## 2015-10-28 DIAGNOSIS — I2510 Atherosclerotic heart disease of native coronary artery without angina pectoris: Secondary | ICD-10-CM | POA: Diagnosis not present

## 2015-10-28 DIAGNOSIS — E785 Hyperlipidemia, unspecified: Secondary | ICD-10-CM | POA: Diagnosis not present

## 2015-10-28 DIAGNOSIS — E559 Vitamin D deficiency, unspecified: Secondary | ICD-10-CM | POA: Diagnosis not present

## 2015-10-28 DIAGNOSIS — Z7982 Long term (current) use of aspirin: Secondary | ICD-10-CM | POA: Diagnosis not present

## 2015-11-01 DIAGNOSIS — Z7982 Long term (current) use of aspirin: Secondary | ICD-10-CM | POA: Diagnosis not present

## 2015-11-01 DIAGNOSIS — Z96641 Presence of right artificial hip joint: Secondary | ICD-10-CM | POA: Diagnosis not present

## 2015-11-01 DIAGNOSIS — E559 Vitamin D deficiency, unspecified: Secondary | ICD-10-CM | POA: Diagnosis not present

## 2015-11-01 DIAGNOSIS — Z471 Aftercare following joint replacement surgery: Secondary | ICD-10-CM | POA: Diagnosis not present

## 2015-11-01 DIAGNOSIS — E785 Hyperlipidemia, unspecified: Secondary | ICD-10-CM | POA: Diagnosis not present

## 2015-11-01 DIAGNOSIS — Z859 Personal history of malignant neoplasm, unspecified: Secondary | ICD-10-CM | POA: Diagnosis not present

## 2015-11-01 DIAGNOSIS — I10 Essential (primary) hypertension: Secondary | ICD-10-CM | POA: Diagnosis not present

## 2015-11-01 DIAGNOSIS — I2510 Atherosclerotic heart disease of native coronary artery without angina pectoris: Secondary | ICD-10-CM | POA: Diagnosis not present

## 2015-11-01 DIAGNOSIS — K589 Irritable bowel syndrome without diarrhea: Secondary | ICD-10-CM | POA: Diagnosis not present

## 2015-11-01 DIAGNOSIS — K219 Gastro-esophageal reflux disease without esophagitis: Secondary | ICD-10-CM | POA: Diagnosis not present

## 2015-11-03 DIAGNOSIS — Z96641 Presence of right artificial hip joint: Secondary | ICD-10-CM | POA: Diagnosis not present

## 2015-11-03 DIAGNOSIS — E559 Vitamin D deficiency, unspecified: Secondary | ICD-10-CM | POA: Diagnosis not present

## 2015-11-03 DIAGNOSIS — K589 Irritable bowel syndrome without diarrhea: Secondary | ICD-10-CM | POA: Diagnosis not present

## 2015-11-03 DIAGNOSIS — Z859 Personal history of malignant neoplasm, unspecified: Secondary | ICD-10-CM | POA: Diagnosis not present

## 2015-11-03 DIAGNOSIS — E785 Hyperlipidemia, unspecified: Secondary | ICD-10-CM | POA: Diagnosis not present

## 2015-11-03 DIAGNOSIS — Z471 Aftercare following joint replacement surgery: Secondary | ICD-10-CM | POA: Diagnosis not present

## 2015-11-03 DIAGNOSIS — Z7982 Long term (current) use of aspirin: Secondary | ICD-10-CM | POA: Diagnosis not present

## 2015-11-03 DIAGNOSIS — K219 Gastro-esophageal reflux disease without esophagitis: Secondary | ICD-10-CM | POA: Diagnosis not present

## 2015-11-03 DIAGNOSIS — I10 Essential (primary) hypertension: Secondary | ICD-10-CM | POA: Diagnosis not present

## 2015-11-03 DIAGNOSIS — I2510 Atherosclerotic heart disease of native coronary artery without angina pectoris: Secondary | ICD-10-CM | POA: Diagnosis not present

## 2015-11-05 DIAGNOSIS — I10 Essential (primary) hypertension: Secondary | ICD-10-CM | POA: Diagnosis not present

## 2015-11-05 DIAGNOSIS — K219 Gastro-esophageal reflux disease without esophagitis: Secondary | ICD-10-CM | POA: Diagnosis not present

## 2015-11-05 DIAGNOSIS — Z7982 Long term (current) use of aspirin: Secondary | ICD-10-CM | POA: Diagnosis not present

## 2015-11-05 DIAGNOSIS — Z471 Aftercare following joint replacement surgery: Secondary | ICD-10-CM | POA: Diagnosis not present

## 2015-11-05 DIAGNOSIS — Z96641 Presence of right artificial hip joint: Secondary | ICD-10-CM | POA: Diagnosis not present

## 2015-11-05 DIAGNOSIS — E785 Hyperlipidemia, unspecified: Secondary | ICD-10-CM | POA: Diagnosis not present

## 2015-11-05 DIAGNOSIS — Z859 Personal history of malignant neoplasm, unspecified: Secondary | ICD-10-CM | POA: Diagnosis not present

## 2015-11-05 DIAGNOSIS — K589 Irritable bowel syndrome without diarrhea: Secondary | ICD-10-CM | POA: Diagnosis not present

## 2015-11-05 DIAGNOSIS — E559 Vitamin D deficiency, unspecified: Secondary | ICD-10-CM | POA: Diagnosis not present

## 2015-11-05 DIAGNOSIS — I2510 Atherosclerotic heart disease of native coronary artery without angina pectoris: Secondary | ICD-10-CM | POA: Diagnosis not present

## 2015-11-06 ENCOUNTER — Ambulatory Visit (INDEPENDENT_AMBULATORY_CARE_PROVIDER_SITE_OTHER): Payer: Medicare Other | Admitting: Family Medicine

## 2015-11-06 DIAGNOSIS — I10 Essential (primary) hypertension: Secondary | ICD-10-CM | POA: Diagnosis not present

## 2015-11-06 DIAGNOSIS — Z471 Aftercare following joint replacement surgery: Secondary | ICD-10-CM | POA: Diagnosis not present

## 2015-11-06 DIAGNOSIS — Z96641 Presence of right artificial hip joint: Secondary | ICD-10-CM

## 2015-11-06 DIAGNOSIS — I2510 Atherosclerotic heart disease of native coronary artery without angina pectoris: Secondary | ICD-10-CM

## 2015-11-08 DIAGNOSIS — I2510 Atherosclerotic heart disease of native coronary artery without angina pectoris: Secondary | ICD-10-CM | POA: Diagnosis not present

## 2015-11-08 DIAGNOSIS — K219 Gastro-esophageal reflux disease without esophagitis: Secondary | ICD-10-CM | POA: Diagnosis not present

## 2015-11-08 DIAGNOSIS — Z7982 Long term (current) use of aspirin: Secondary | ICD-10-CM | POA: Diagnosis not present

## 2015-11-08 DIAGNOSIS — Z859 Personal history of malignant neoplasm, unspecified: Secondary | ICD-10-CM | POA: Diagnosis not present

## 2015-11-08 DIAGNOSIS — Z471 Aftercare following joint replacement surgery: Secondary | ICD-10-CM | POA: Diagnosis not present

## 2015-11-08 DIAGNOSIS — E785 Hyperlipidemia, unspecified: Secondary | ICD-10-CM | POA: Diagnosis not present

## 2015-11-08 DIAGNOSIS — E559 Vitamin D deficiency, unspecified: Secondary | ICD-10-CM | POA: Diagnosis not present

## 2015-11-08 DIAGNOSIS — I10 Essential (primary) hypertension: Secondary | ICD-10-CM | POA: Diagnosis not present

## 2015-11-08 DIAGNOSIS — Z96641 Presence of right artificial hip joint: Secondary | ICD-10-CM | POA: Diagnosis not present

## 2015-11-08 DIAGNOSIS — K589 Irritable bowel syndrome without diarrhea: Secondary | ICD-10-CM | POA: Diagnosis not present

## 2015-11-11 DIAGNOSIS — I10 Essential (primary) hypertension: Secondary | ICD-10-CM | POA: Diagnosis not present

## 2015-11-11 DIAGNOSIS — I2510 Atherosclerotic heart disease of native coronary artery without angina pectoris: Secondary | ICD-10-CM | POA: Diagnosis not present

## 2015-11-11 DIAGNOSIS — Z471 Aftercare following joint replacement surgery: Secondary | ICD-10-CM | POA: Diagnosis not present

## 2015-11-11 DIAGNOSIS — E559 Vitamin D deficiency, unspecified: Secondary | ICD-10-CM | POA: Diagnosis not present

## 2015-11-11 DIAGNOSIS — Z859 Personal history of malignant neoplasm, unspecified: Secondary | ICD-10-CM | POA: Diagnosis not present

## 2015-11-11 DIAGNOSIS — K219 Gastro-esophageal reflux disease without esophagitis: Secondary | ICD-10-CM | POA: Diagnosis not present

## 2015-11-11 DIAGNOSIS — Z96641 Presence of right artificial hip joint: Secondary | ICD-10-CM | POA: Diagnosis not present

## 2015-11-11 DIAGNOSIS — K589 Irritable bowel syndrome without diarrhea: Secondary | ICD-10-CM | POA: Diagnosis not present

## 2015-11-11 DIAGNOSIS — Z7982 Long term (current) use of aspirin: Secondary | ICD-10-CM | POA: Diagnosis not present

## 2015-11-11 DIAGNOSIS — E785 Hyperlipidemia, unspecified: Secondary | ICD-10-CM | POA: Diagnosis not present

## 2015-11-17 DIAGNOSIS — Z96641 Presence of right artificial hip joint: Secondary | ICD-10-CM | POA: Diagnosis not present

## 2015-11-22 ENCOUNTER — Other Ambulatory Visit: Payer: Self-pay | Admitting: Family Medicine

## 2015-12-07 ENCOUNTER — Ambulatory Visit (INDEPENDENT_AMBULATORY_CARE_PROVIDER_SITE_OTHER): Payer: Medicare Other

## 2015-12-07 ENCOUNTER — Encounter: Payer: Self-pay | Admitting: Pharmacist

## 2015-12-07 ENCOUNTER — Ambulatory Visit (INDEPENDENT_AMBULATORY_CARE_PROVIDER_SITE_OTHER): Payer: Medicare Other | Admitting: Pharmacist

## 2015-12-07 VITALS — BP 122/62 | HR 74 | Ht 63.0 in | Wt 154.0 lb

## 2015-12-07 DIAGNOSIS — Z780 Asymptomatic menopausal state: Secondary | ICD-10-CM

## 2015-12-07 DIAGNOSIS — S72001D Fracture of unspecified part of neck of right femur, subsequent encounter for closed fracture with routine healing: Secondary | ICD-10-CM

## 2015-12-07 DIAGNOSIS — Z0000 Encounter for general adult medical examination without abnormal findings: Secondary | ICD-10-CM | POA: Diagnosis not present

## 2015-12-07 DIAGNOSIS — D649 Anemia, unspecified: Secondary | ICD-10-CM | POA: Diagnosis not present

## 2015-12-07 DIAGNOSIS — M85852 Other specified disorders of bone density and structure, left thigh: Secondary | ICD-10-CM

## 2015-12-07 MED ORDER — CALCIUM CITRATE-VITAMIN D 315-250 MG-UNIT PO TABS: 2.0000 | ORAL_TABLET | Freq: Every day | ORAL | Status: DC

## 2015-12-07 NOTE — Progress Notes (Signed)
Patient ID: Amy Black, female   DOB: [birth year], 75 y.o.   MRN: [id number]    Subjective:   Amy Black is a 75 y.o. female who presents for an Initial Medicare Annual Wellness Visit.  Amy Black lives in [place] with her husband and adult daughter.  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    She fractured her right hip in August 2017 and complains today of stinging, burning and some pain in the right hip mostly when she sleeps at night.  She states that the pain has been present since June 2017 before the fracture occurred.    Current Medications (verified) Outpatient Encounter Prescriptions as of 12/07/2015  Medication Sig  . ALPRAZolam (XANAX) 0.5 MG tablet Take 1 tablet (0.5 mg total) by mouth 3 (three) times daily.  Marland Kitchen aspirin 81 MG tablet Take 1 tablet (81 mg total) by mouth 2 (two) times daily with a meal. (Patient taking differently: Take 81 mg by mouth daily. )  . cetirizine (ZYRTEC) 10 MG tablet Take 1 tablet (10 mg total) by mouth 2 (two) times daily. (Patient taking differently: Take 10 mg by mouth as needed for allergies. )  . cholecalciferol (VITAMIN D) 1000 units tablet Take 1,000 Units by mouth daily.  Marland Kitchen docusate sodium (COLACE) 100 MG capsule Take 1 capsule (100 mg total) by mouth 2 (two) times daily.  . ferrous sulfate 325 (65 FE) MG tablet Take 1 tablet (325 mg total) by mouth daily with breakfast.  . fluticasone (FLONASE) 50 MCG/ACT nasal spray Place 2 sprays into both nostrils daily. (Patient taking differently: Place 2 sprays into both nostrils daily as needed. )  . isosorbide mononitrate (IMDUR) 30 MG 24 hr tablet TAKE 1/2 TABLET BY MOUTH DAILY. (Patient taking differently: TAKE 1 TABLET BY MOUTH DAILY.)  . loperamide (IMODIUM) 2 MG capsule TAKE ONE CAPSULE BY MOUTH FOUR TIMES DAILY AS NEEDED FOR DIARRHEA / LOOSE STOOLS  . NITROSTAT 0.4 MG SL tablet DISSOLVE ONE TABLET UNDER TONGUE EVERY 5 MINUTES UP TO 3 DOSES AS NEEDED  FOR CHEST PAIN  . pantoprazole (PROTONIX) 40 MG tablet Take 1 tablet (40 mg total) by mouth 2 (two) times daily. KEEP OV. (Patient taking differently: Take 40 mg by mouth daily. KEEP OV.)  . traMADol (ULTRAM) 50 MG tablet TAKE ONE TABLET BY MOUTH EVERY TWELVE HOURS AS NEEDED.  Marland Kitchen triamterene-hydrochlorothiazide (DYAZIDE) 37.5-25 MG capsule TAKE ONE CAPSULE BY MOUTH 3 TIMES PER WEEK  . verapamil (CALAN) 80 MG tablet TAKE ONE TABLET BY MOUTH FOUR TIMES DAILY  . Calcium Citrate-Vitamin D (CALCIUM CITRATE + D3) 315-250 MG-UNIT TABS Take 2 tablets by mouth daily.  . [DISCONTINUED] Vitamin D, Ergocalciferol, (DRISDOL) 50000 units CAPS capsule Take 50,000 Units by mouth every Monday.   No facility-administered encounter medications on file as of 12/07/2015.     Allergies (verified) Iodine; Cymbalta [duloxetine hcl]; Gabapentin; Ivp dye [iodinated diagnostic agents]; and Zanaflex [tizanidine hcl]   History: Past Medical History:  Diagnosis Date  . Adenocarcinoma (HCC)    LEFT LEG  . Adenocarcinoma of unknown primary ([place]) 02/06/2011  . 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 Allergy   . Anxiety   . CAD (coronary artery disease)   . Cataract    bilateral cataracts removed  . Coronary artery spasm ([place])   . Diverticulosis of colon (without mention of hemorrhage)   . GERD (gastroesophageal reflux disease)   .  hepatic cyst   . Hip fracture, right ([place])   . History of cardiac catheterization   . History of nuclear stress test 04/2011   lexiscan; normal study, no significant ischemia, low risk   . HTN (hypertension)    PT. DENIES  . Hyperlipidemia   . Insomnia   . Irritable bowel syndrome   . Transverse myelitis ([place])   . Unstable angina ([place])   . Vitamin D deficiency    Past Surgical History:  Procedure Laterality Date  . ABDOMINAL HYSTERECTOMY Bilateral   . CARDIAC CATHETERIZATION     coronary spasm  . CARDIAC CATHETERIZATION N/A 08/28/2014   Procedure:  Left Heart Cath and Coronary Angiography;  Surgeon: Jettie Booze, MD;  Location: [facility];  Service: Cardiovascular;  Laterality: N/A;  . COLONOSCOPY    . DIAGNOSTIC LAPAROSCOPY    . LYSIS OF ADHESION    . PELVIC LAPAROSCOPY  1992   RSO, AND LSO ON 2007  . RESECTION SOFT TISSUE TUMOR LEG / ANKLE RADICAL  2004   leg lesion resection & radiation  . SALPINGOOPHORECTOMY Left   . TOTAL HIP ARTHROPLASTY Right 10/04/2015   Procedure: TOTAL HIP ARTHROPLASTY ANTERIOR APPROACH;  Surgeon: Rod Can, MD;  Location: [facility];  Service: Orthopedics;  Laterality: Right;  . TRANSTHORACIC ECHOCARDIOGRAM  07/2012   LV cavity size mildly reduced, normal wall motion; MV with calcified annulus and mild MR; LA mildly dilated; atrial septum with increased thickness - lipomatous hypertrophy; RV systolic pressure increased (borderline pulm HTN)  . UPPER GASTROINTESTINAL ENDOSCOPY     Family History  Problem Relation Age of Onset  . Bladder Cancer Mother   . Heart disease Mother   . Hypertension Mother   . Heart disease Father   . Stroke Father   . Diabetes      Multiple family members on both sides   . Coronary artery disease      Multiple family members on both sides   . Hyperlipidemia Sister   . Depression Sister   . Diabetes Brother   . Seizures Brother   . Hypertension Brother   . Colon cancer Neg Hx   . Esophageal cancer Neg Hx   . Pancreatic cancer Neg Hx   . Rectal cancer Neg Hx   . Stomach cancer Neg Hx    Social History   Occupational History  . Disabilty   . DISABILITY Unemployed   Social History Main Topics  . Smoking status: Former Smoker    Packs/day: 0.50    Years: 4.00    Types: Cigarettes    Start date: 12/07/1968    Quit date: 11/20/1972  . Smokeless tobacco: Never Used     Comment: quit 40 years ago  . Alcohol use No  . Drug use: No  . Sexual activity: No    Do you feel safe at home?  Yes Dietary issues and exercise activities: Current Exercise  Habits: Home exercise routine, Type of exercise: stretching;strength training/weights, Time (Minutes): 15, Frequency (Times/Week): 3, Weekly Exercise (Minutes/Week): 45, Intensity: Mild  Current Dietary habits:  Patient tried to limit salt intake due to history of heart condition.  She eats usually 3 meals daily.  Lots of vegetables and greens  Objective:    Today's Vitals   12/07/15 1515  BP: 122/62  Pulse: 74  Weight: 154 lb (69.9 kg)  Height: 5\' 3"  (1.6 m)  PainSc: 6   PainLoc: Hip   Body mass index is 27.28 kg/m.  DEXA results:  T-Score for AP Spine = -0.2 T-Score for total Left femur = -1.1 T-Score for neck of Left femur = -1.4   Activities of Daily Living In your present state of health, do you have any difficulty performing the following activities: 12/07/2015 10/04/2015  Hearing? N -  Vision? N -  Difficulty concentrating or making decisions? N -  Walking or climbing stairs? Y -  Dressing or bathing? N -  Doing errands, shopping? N N  Preparing Food and eating ? N -  Using the Toilet? N -  In the past six months, have you accidently leaked urine? N -  Do you have problems with loss of bowel control? N -  Managing your Medications? N -  Managing your Finances? N -  Housekeeping or managing your Housekeeping? N -  Some recent data might be hidden     Cardiac Risk Factors include: advanced age (>22men, >63 women);dyslipidemia  Depression Screen PHQ 2/9 Scores 12/07/2015 10/22/2015 07/09/2015 06/17/2015  PHQ - 2 Score 0 0 0 0     Fall Risk Fall Risk  12/07/2015 10/22/2015 07/09/2015 06/17/2015 01/18/2015  Falls in the past year? Yes Yes No No No  Number falls in past yr: 1 1 - - -  Injury with Fall? Yes Yes - - -  Risk Factor Category  High Fall Risk - - - -  Risk for fall due to : History of fall(s) - - - -  Follow up Falls prevention discussed Falls prevention discussed - - -    Cognitive Function: MMSE - Mini Mental State Exam 12/07/2015  Orientation to  time 5  Orientation to Place 5  Registration 3  Attention/ Calculation 3  Recall 3  Language- name 2 objects 2  Language- repeat 1  Language- follow 3 step command 3  Language- read & follow direction 1  Write a sentence 1  Copy design 1  Total score 28    Immunizations and Health Maintenance Immunization History  Administered Date(s) Administered  . Pneumococcal Polysaccharide-23 02/07/2000  . Tdap 02/06/2002  . Zoster 02/06/2010   Health Maintenance Due  Topic Date Due  . PAP SMEAR  11/28/2011    Patient Care Team: [name], MD as PCP - General (Family Medicine) Lonna Cobb, PT (Inactive) as Physical Therapist (Physical Therapy) Minus Breeding, MD as Consulting Physician (Cardiology) Eliezer Bottom, NP as Nurse Practitioner (Nurse Practitioner) Doran Stabler, MD as Consulting Physician (Gastroenterology)  Indicate any recent Medical Services you may have received from other than [facility] providers in the past year (date may be approximate).    Assessment:    Annual Wellness Visit  Right hip pain Osteopenia with Frax Estimate for hip = 1.1%; major fracture = 7.4%   Screening Tests Health Maintenance  Topic Date Due  . PAP SMEAR  11/28/2011  . TETANUS/TDAP  02/08/2016 (Originally 02/07/2012)  . PNA vac Low Risk Adult (1 of 2 - PCV13) 02/08/2016 (Originally 10/13/2007)  . INFLUENZA VACCINE  05/06/2016 (Originally 09/07/2015)  . MAMMOGRAM  02/05/2016  . DEXA SCAN  12/06/2017  . COLONOSCOPY  08/11/2020  . ZOSTAVAX  Completed        Plan:   During the course of the visit Amy Black was educated and counseled about the following appropriate screening and preventive services:   Vaccines to include Pneumoccal, Influenza, Td, Zostavax - patient declined all vaccines  Colorectal cancer screening - colonoscopy is UTD 08/2015  Cardiovascular disease screening - last EKG 2017 -  sees Dr Jenkins [place] regularly  BP at goal today; Last lipids panel showed  elevated LDL but patient declined statin therapy  Diabetes screening - UTD and normail  Bone Denisty / Osteoporosis Screening - [facility] today.    Patient advised to start calcium citrate 315mg  2 per day - and continue to eat high calcium foods to get 1200mg  of calcium daily from diet and supplementation;  continue vitamin D 1000 IU daily  Patient given appt tomorrow to see Dr Sabra Heck to assess hip pain.     Mammogram - apt made for Feb 2017  PAP - no longer required  Glaucoma screening / Eye Exam - UTD  Nutrition counseling - discussed getting calcium rich foods, continue to limit salt intake.   Fall prevention discussed  Discussed use of medications that can increase falls specifically alprazolam and tramadol.  Patient take tramadol very rarely.  Recommended try to decrease amount of alprazolam - try to use 1/2 tablet and only as needed  Advanced Directives - information provided  Physical Activity - will hold off recommending at this time until hip pain assessed  Ferrous started after surgery.  Checking CBC today and if HBG is WNL then will discontinue ferrous sulfate.  Orders Placed This Encounter  Procedures  . CBC with Differential/Platelet    Patient Instructions (the written plan) were given to the patient.   Cherre Robins, PharmD   12/08/2015

## 2015-12-07 NOTE — Patient Instructions (Addendum)
Amy Black , Thank you for taking time to come for your Medicare Wellness Visit. I appreciate your ongoing commitment to your health goals. Please review the following plan we discussed and let me know if I can assist you in the future.   These are the goals we discussed:  Add calcium citrate + D 315mg  - take 2 tablets once a day  Come back tomorrow to see Dr Sabra Heck for hip pain.  We are checking hemoglobin today - we hope to be able to stop iron supplement.   This is a list of the screening recommended for you and due dates:  Health Maintenance  Topic Date Due  . Pneumonia vaccines (1 of 2 - PCV13) 10/13/2007  . Pap Smear  11/28/2011  . Tetanus Vaccine  02/07/2012  . Flu Shot  09/07/2015  . Mammogram  02/05/2016  . Colon Cancer Screening  08/11/2020  . DEXA scan (bone density measurement)  Completed  . Shingles Vaccine  Completed    Fall Prevention in the Home  Falls can cause injuries and can affect people from all age groups. There are many simple things that you can do to make your home safe and to help prevent falls. WHAT CAN I DO ON THE OUTSIDE OF MY HOME?  Regularly repair the edges of walkways and driveways and fix any cracks.  Remove high doorway thresholds.  Trim any shrubbery on the main path into your home.  Use bright outdoor lighting.  Clear walkways of debris and clutter, including tools and rocks.  Regularly check that handrails are securely fastened and in good repair. Both sides of any steps should have handrails.  Install guardrails along the edges of any raised decks or porches.  Have leaves, snow, and ice cleared regularly.  Use sand or salt on walkways during winter months.  In the garage, clean up any spills right away, including grease or oil spills. WHAT CAN I DO IN THE BATHROOM?  Use night lights.  Install grab bars by the toilet and in the tub and shower. Do not use towel bars as grab bars.  Use non-skid mats or decals on the floor of  the tub or shower.  If you need to sit down while you are in the shower, use a plastic, non-slip stool.Marland Kitchen  Keep the floor dry. Immediately clean up any water that spills on the floor.  Remove soap buildup in the tub or shower on a regular basis.  Attach bath mats securely with double-sided non-slip rug tape.  Remove throw rugs and other tripping hazards from the floor. WHAT CAN I DO IN THE BEDROOM?  Use night lights.  Make sure that a bedside light is easy to reach.  Do not use oversized bedding that drapes onto the floor.  Have a firm chair that has side arms to use for getting dressed.  Remove throw rugs and other tripping hazards from the floor. WHAT CAN I DO IN THE KITCHEN?   Clean up any spills right away.  Avoid walking on wet floors.  Place frequently used items in easy-to-reach places.  If you need to reach for something above you, use a sturdy step stool that has a grab bar.  Keep electrical cables out of the way.  Do not use floor polish or wax that makes floors slippery. If you have to use wax, make sure that it is non-skid floor wax.  Remove throw rugs and other tripping hazards from the floor. WHAT CAN I DO IN  THE STAIRWAYS?  Do not leave any items on the stairs.  Make sure that there are handrails on both sides of the stairs. Fix handrails that are broken or loose. Make sure that handrails are as long as the stairways.  Check any carpeting to make sure that it is firmly attached to the stairs. Fix any carpet that is loose or worn.  Avoid having throw rugs at the top or bottom of stairways, or secure the rugs with carpet tape to prevent them from moving.  Make sure that you have a light switch at the top of the stairs and the bottom of the stairs. If you do not have them, have them installed. WHAT ARE SOME OTHER FALL PREVENTION TIPS?  Wear closed-toe shoes that fit well and support your feet. Wear shoes that have rubber soles or low heels.  When you use  a stepladder, make sure that it is completely opened and that the sides are firmly locked. Have someone hold the ladder while you are using it. Do not climb a closed stepladder.  Add color or contrast paint or tape to grab bars and handrails in your home. Place contrasting color strips on the first and last steps.  Use mobility aids as needed, such as canes, walkers, scooters, and crutches.  Turn on lights if it is dark. Replace any light bulbs that burn out.  Set up furniture so that there are clear paths. Keep the furniture in the same spot.  Fix any uneven floor surfaces.  Choose a carpet design that does not hide the edge of steps of a stairway.  Be aware of any and all pets.  Review your medicines with your healthcare provider. Some medicines can cause dizziness or changes in blood pressure, which increase your risk of falling. Talk with your health care provider about other ways that you can decrease your risk of falls. This may include working with a physical therapist or trainer to improve your strength, balance, and endurance.   This information is not intended to replace advice given to you by your health care provider. Make sure you discuss any questions you have with your health care provider.   Document Released: 01/13/2002 Document Revised: 06/09/2014 Document Reviewed: 02/27/2014 Elsevier Interactive Patient Education 2016 [employer]  Calcium & Vitamin D: The Facts  Why is calcium and vitamin D consumption important? Calcium: . Most Americans do not consume adequate amounts of calcium! Calcium is required for proper muscle function, nerve communication, bone support, and many other functions in the body.  . The body uses bones as a source of calcium. Bones 'remodel' themselves continuously - the body constantly breaks bone down to release calcium and rebuilds bones by replacing calcium in the bone later.  . As we get older, the rate of bone breakdown occurs faster  than bone rebuilding which could lead to osteopenia, osteoporosis, and possible fractures.   Vitamin D: . People naturally make vitamin D in the body when sunlight hits the skin and triggers a process that leads to vitamin D production. This natural vitamin D production requires about 10-15 minutes of sun exposure on the hands, arms, and face at least 2-3 times per week. However, due to decreased sun exposure and the use of sunscreen, most people will need to get additional vitamin D from foods or supplements. Your doctor can measure your body's vitamin D level through a simple blood test to determine your daily vitamin D needs.  . Vitamin D is used to  help the body absorb calcium, maintain bone health, help the immune system, and reduce inflammation. It also plays a role in muscle performance, balance and risk of falling.  . Vitamin D deficiency can lead to osteomalacia or softening of the bones, bone pain, and muscle weakness.   The recommended daily allowance of Calcium and Vitamin D varies for different age groups. Age group Calcium (mg) Vitamin D (IU)  Females and Males: Age 44-50 1000 mg 600 IU  Females: Age 18- 42 1200 mg 600 IU  Males: Age 4-70 1000 mg 600 IU  Females and Males: Age 44+ 1200 mg 800 IU  Pregnant/lactating Females age 14-50 1000 mg 600 IU   How much Calcium do you get in your diet? Calcium Intake # of servings per day  Total calcium (mg)  Skim milk, 2% milk (1 cup) _________ x 300 mg   Yogurt (1 small container) _________ x 200 mg   Cheese (1oz) _________ x 200 mg   Cottage Cheese (1 cup)             ________ x 150 mg   Almond milk (1 cup) _________ x 450 mg   Fortified Orange Juice (1 cup) _________ x 300 mg   Broccoli or spinach ( 1 cup) _________ x 100 mg   Salmon (3 oz) _________ x 150 mg    Almonds (1/4 cup) _______ x 90 mg      How do we get Calcium and Vitamin D in our diet? Calcium: . Obtaining calcium from the diet is the most preferred way to reach the  recommended daily goal. If this goal is not reached through diet, calcium supplements are available.  . Calcium is found in many foods including: dairy products, dark leafy vegetables (like broccoli, kale, and spinach), fish, and fortified products like juices and cereals.  . The food label will have a %DV (percent daily value) listed showing the amount of calcium per serving. To determine the total mg per serving, simply replace the % with zero (0).  For example, Almond Breeze almond milk contains 45% DV of calcium or 450mg  per 1 cup.  . You can increase the amount of calcium in your diet by using more calcium products in your daily meals. Use yogurt and fruit to make smoothies or use yogurt to top baked potatoes or make whipped potatoes. Sprinkle low fat cheese onto salads or into egg white omelets. You can even add non-fat dry milk powder (300mg  calcium per 1/3 cup) to hot cereals, meat loaf, soups, or potatoes.  . Calcium supplements come in many forms including tablets, chewables, and gummies. Be sure to read the label to determine the correct number of tablets per serving and whether or not to take the supplement with food.  . Calcium carbonate products (Oscal, Caltrate, and Viactiv) are generally better absorbed when taken with food while calcium citrate products like Citracal can be taken with or without food.  . The body can only absorb about 600 mg of calcium at one time. It is recommended to take calcium supplements in small amounts several times per day.  However, taking it all at once is better than not taking it at all. . Increasing your intake of calcium is essential for bone health, but may also lead to some side effects like constipation, increased gas, bloating or abdominal cramping. To help reduce these side effects, start with 1 tablet per day and slowly increase your intake of the supplement to the recommended doses.  It is also recommended that you drink plenty of water each  day. Vitamin D: . Very few foods naturally contain vitamin D. However, it is found in saltwater fish (like tuna, salmon and mackerel), beef liver, egg yolks, cheese and vitamin D fortified foods (like yogurt, cereals, orange juice and milk) . The amount of vitamin D in each food or product is listed as %DV on the product label. To determine the total amount of vitamin D per serving, drop the % sign and multiply the number by 4. For example, 1 cup of Almond Breeze almond milk contains 25% DV vitamin D or 100 IU per serving (25 x 4 =100). . Vitamin D is also found in multivitamins and supplements and may be listed as ergocalciferol (vitamin D2) or cholecalciferol (vitamin D3). Each of these forms of vitamin D are equivalent and the daily recommended intake will vary based on your age and the vitamin D levels in your body. Follow your doctor's recommendation for vitamin D intake.      \

## 2015-12-08 ENCOUNTER — Encounter: Payer: Self-pay | Admitting: Family Medicine

## 2015-12-08 ENCOUNTER — Ambulatory Visit (INDEPENDENT_AMBULATORY_CARE_PROVIDER_SITE_OTHER): Payer: Medicare Other | Admitting: Family Medicine

## 2015-12-08 VITALS — BP 131/75 | HR 55 | Temp 97.0°F | Ht 63.0 in | Wt 155.0 lb

## 2015-12-08 DIAGNOSIS — R350 Frequency of micturition: Secondary | ICD-10-CM | POA: Diagnosis not present

## 2015-12-08 DIAGNOSIS — M25551 Pain in right hip: Secondary | ICD-10-CM | POA: Diagnosis not present

## 2015-12-08 LAB — URINALYSIS, COMPLETE
Bilirubin, UA: NEGATIVE
Glucose, UA: NEGATIVE
Ketones, UA: NEGATIVE
Nitrite, UA: NEGATIVE
Protein, UA: NEGATIVE
RBC, UA: NEGATIVE
Specific Gravity, UA: 1.015 (ref 1.005–1.030)
Urobilinogen, Ur: 0.2 mg/dL (ref 0.2–1.0)
pH, UA: 5.5 (ref 5.0–7.5)

## 2015-12-08 LAB — CBC WITH DIFFERENTIAL/PLATELET
Basophils Absolute: 0 10*3/uL (ref 0.0–0.2)
Basos: 1 %
EOS (ABSOLUTE): 0.3 10*3/uL (ref 0.0–0.4)
Eos: 5 %
Hematocrit: 34 % (ref 34.0–46.6)
Hemoglobin: 10.9 g/dL — ABNORMAL LOW (ref 11.1–15.9)
Immature Grans (Abs): 0 10*3/uL (ref 0.0–0.1)
Immature Granulocytes: 0 %
Lymphocytes Absolute: 2.7 10*3/uL (ref 0.7–3.1)
Lymphs: 41 %
MCH: 29.5 pg (ref 26.6–33.0)
MCHC: 32.1 g/dL (ref 31.5–35.7)
MCV: 92 fL (ref 79–97)
Monocytes Absolute: 0.6 10*3/uL (ref 0.1–0.9)
Monocytes: 8 %
Neutrophils Absolute: 3 10*3/uL (ref 1.4–7.0)
Neutrophils: 45 %
Platelets: 461 10*3/uL — ABNORMAL HIGH (ref 150–379)
RBC: 3.7 x10E6/uL — ABNORMAL LOW (ref 3.77–5.28)
RDW: 14.4 % (ref 12.3–15.4)
WBC: 6.6 10*3/uL (ref 3.4–10.8)

## 2015-12-08 LAB — MICROSCOPIC EXAMINATION
Bacteria, UA: NONE SEEN
RBC, UA: NONE SEEN /hpf (ref 0–?)

## 2015-12-08 MED ORDER — NITROFURANTOIN MONOHYD MACRO 100 MG PO CAPS: 100.0000 mg | ORAL_CAPSULE | Freq: Two times a day (BID) | ORAL | 0 refills | Status: DC

## 2015-12-08 MED ORDER — PROMETHAZINE HCL 12.5 MG PO TABS: 12.5000 mg | ORAL_TABLET | Freq: Three times a day (TID) | ORAL | 0 refills | Status: DC | PRN

## 2015-12-08 MED ORDER — PROMETHAZINE HCL 12.5 MG RE SUPP: 12.5000 mg | Freq: Four times a day (QID) | RECTAL | 0 refills | Status: DC | PRN

## 2015-12-08 NOTE — Addendum Note (Signed)
Addended by: Jamelle Haring on: 12/08/2015 02:48 PM   Modules accepted: Orders

## 2015-12-08 NOTE — Progress Notes (Signed)
Subjective:    Patient ID: Amy Black, female    DOB: February 26, 1940, 75 y.o.   MRN: [id number]  HPI 75 year old female with several complaints. She had hip replacement in August. She's been through therapy and doing fairly well still walks with a cane today she complains of pain in that hip. She says the pain is on the lateral aspect and it hurts to lay on the hip at night. This pain started before the hip surgery and has persisted. She also complains of some nausea and loss of appetite but she has not lost any weight. Also complains of urinary frequency and discomfort.  Patient Active Problem List   Diagnosis Date Noted  . Femoral neck fracture, right, closed, initial encounter 10/04/2015  . Closed right hip fracture ([place]) 10/03/2015  . Thrombocytosis ([place]) 07/23/2015  . Exertional angina ([facility])   . Baker's cyst of knee 02/14/2013  . Coronary artery spasm ([place]) 11/19/2012  . Transverse myelitis ([place]) 10/10/2012  . HTN (hypertension) 08/29/2012  . Arthritis of neck ([place]) 08/29/2012  . Leg weakness 02/20/2012  . Adenocarcinoma of unknown primary ([place]) 02/06/2011  . Anxiety 05/19/2010  . Coronary atherosclerosis 12/03/2009  . GERD 12/03/2009   Outpatient Encounter Prescriptions as of 12/08/2015  Medication Sig  . ALPRAZolam (XANAX) 0.5 MG tablet Take 1 tablet (0.5 mg total) by mouth 3 (three) times daily.  Marland Kitchen aspirin 81 MG tablet Take 1 tablet (81 mg total) by mouth 2 (two) times daily with a meal. (Patient taking differently: Take 81 mg by mouth daily. )  . Calcium Citrate-Vitamin D (CALCIUM CITRATE + D3) 315-250 MG-UNIT TABS Take 2 tablets by mouth daily.  . cetirizine (ZYRTEC) 10 MG tablet Take 1 tablet (10 mg total) by mouth 2 (two) times daily. (Patient taking differently: Take 10 mg by mouth as needed for allergies. )  . cholecalciferol (VITAMIN D) 1000 units tablet Take 1,000 Units by mouth daily.  Marland Kitchen docusate sodium (COLACE) 100 MG capsule Take 1 capsule (100 mg total) by mouth  2 (two) times daily.  . ferrous sulfate 325 (65 FE) MG tablet Take 1 tablet (325 mg total) by mouth daily with breakfast.  . fluticasone (FLONASE) 50 MCG/ACT nasal spray Place 2 sprays into both nostrils daily. (Patient taking differently: Place 2 sprays into both nostrils daily as needed. )  . isosorbide mononitrate (IMDUR) 30 MG 24 hr tablet TAKE 1/2 TABLET BY MOUTH DAILY. (Patient taking differently: TAKE 1 TABLET BY MOUTH DAILY.)  . loperamide (IMODIUM) 2 MG capsule TAKE ONE CAPSULE BY MOUTH FOUR TIMES DAILY AS NEEDED FOR DIARRHEA / LOOSE STOOLS  . NITROSTAT 0.4 MG SL tablet DISSOLVE ONE TABLET UNDER TONGUE EVERY 5 MINUTES UP TO 3 DOSES AS NEEDED FOR CHEST PAIN  . pantoprazole (PROTONIX) 40 MG tablet Take 1 tablet (40 mg total) by mouth 2 (two) times daily. KEEP OV. (Patient taking differently: Take 40 mg by mouth daily. KEEP OV.)  . traMADol (ULTRAM) 50 MG tablet TAKE ONE TABLET BY MOUTH EVERY TWELVE HOURS AS NEEDED.  Marland Kitchen triamterene-hydrochlorothiazide (DYAZIDE) 37.5-25 MG capsule TAKE ONE CAPSULE BY MOUTH 3 TIMES PER WEEK  . verapamil (CALAN) 80 MG tablet TAKE ONE TABLET BY MOUTH FOUR TIMES DAILY   No facility-administered encounter medications on file as of 12/08/2015.       Review of Systems  Constitutional: Positive for appetite change.  Respiratory: Negative.   Genitourinary: Positive for frequency.  Musculoskeletal: Positive for arthralgias.  Neurological: Negative.   Psychiatric/Behavioral: Negative.  Objective:   Physical Exam  Constitutional: She is oriented to person, place, and time. She appears well-developed.  Cardiovascular: Normal rate.   Pulmonary/Chest: Effort normal.  Musculoskeletal:  Right hip well-healed scar anteriorly. In palpation patient is tender over greater trochanter and I think she might have some trochanteric bursitis. This was injected with Depo-Medrol and Marcaine. Patient tolerated this well. Hopefully this will resolve her lateral hip pain.   Neurological: She is alert and oriented to person, place, and time.  Psychiatric: She has a normal mood and affect. Her behavior is normal.   BP 131/75   Pulse (!) 55   Temp 97 F (36.1 C) (Oral)   Ht 5\' 3"  (1.6 m)   Wt 155 lb (70.3 kg)   BMI 27.46 kg/m         Assessment & Plan:  1. Urinary frequency Urinalysis has some white blood cells. Plan to culture and begin Macrobid pending result of culture. Encouraged drinking plenty of water. - Urinalysis, Complete   2. Acute pain of right hip Greater trochanter was injected as described above  Wardell Honour MD

## 2015-12-13 ENCOUNTER — Other Ambulatory Visit: Payer: Self-pay | Admitting: *Deleted

## 2015-12-13 NOTE — Telephone Encounter (Signed)
Last filled [id number]. If approved route to pool to have nurse phone in

## 2015-12-14 MED ORDER — ALPRAZOLAM 0.5 MG PO TABS: 0.5000 mg | ORAL_TABLET | Freq: Three times a day (TID) | ORAL | 1 refills | Status: DC

## 2015-12-22 ENCOUNTER — Other Ambulatory Visit: Payer: Self-pay | Admitting: Family Medicine

## 2015-12-22 DIAGNOSIS — M791 Myalgia: Secondary | ICD-10-CM | POA: Diagnosis not present

## 2015-12-22 DIAGNOSIS — K5901 Slow transit constipation: Secondary | ICD-10-CM

## 2015-12-22 DIAGNOSIS — M549 Dorsalgia, unspecified: Secondary | ICD-10-CM | POA: Diagnosis not present

## 2015-12-22 DIAGNOSIS — R1030 Lower abdominal pain, unspecified: Secondary | ICD-10-CM

## 2015-12-22 DIAGNOSIS — M5136 Other intervertebral disc degeneration, lumbar region: Secondary | ICD-10-CM | POA: Diagnosis not present

## 2015-12-22 DIAGNOSIS — M79671 Pain in right foot: Secondary | ICD-10-CM | POA: Diagnosis not present

## 2015-12-22 NOTE — Telephone Encounter (Signed)
Lmtcb.

## 2015-12-22 NOTE — Telephone Encounter (Signed)
Amy Black is coverage for St[facility] - I am not familiar with one of these meds - please address refills

## 2015-12-27 ENCOUNTER — Telehealth: Payer: Self-pay | Admitting: Cardiology

## 2015-12-27 DIAGNOSIS — I208 Other forms of angina pectoris: Secondary | ICD-10-CM

## 2015-12-27 MED ORDER — ISOSORBIDE MONONITRATE ER 30 MG PO TB24: 30.0000 mg | ORAL_TABLET | Freq: Every day | ORAL | 6 refills | Status: DC

## 2015-12-27 NOTE — Telephone Encounter (Signed)
Rx has been sent to the pharmacy electronically. ° °

## 2015-12-27 NOTE — Telephone Encounter (Signed)
New message  Pt needs new RX   isosorbide mononitrate 30mg  1/2  Pt is now taking a whole tablet and pharm needs new Rx.  [facility]

## 2016-01-06 ENCOUNTER — Telehealth: Payer: Self-pay | Admitting: Cardiology

## 2016-01-06 DIAGNOSIS — M7061 Trochanteric bursitis, right hip: Secondary | ICD-10-CM | POA: Diagnosis not present

## 2016-01-06 DIAGNOSIS — Z471 Aftercare following joint replacement surgery: Secondary | ICD-10-CM | POA: Diagnosis not present

## 2016-01-06 DIAGNOSIS — S72091D Other fracture of head and neck of right femur, subsequent encounter for closed fracture with routine healing: Secondary | ICD-10-CM | POA: Diagnosis not present

## 2016-01-06 DIAGNOSIS — Z96641 Presence of right artificial hip joint: Secondary | ICD-10-CM | POA: Diagnosis not present

## 2016-01-06 NOTE — Telephone Encounter (Signed)
Spoke to patient. 2 isolated episodes of chest pain she notes this week, 1 on Tuesday, 1 last night. She took NTG SL x1 for each and these resolved. Additionally, she took 81mg  ASA last night.    She did endorse some arm pain but denies other symptoms such as SOB, dizziness, fatigue, other radiating pain.  Her symptoms resolved after 1st dose of NTG each time. Noted discomfort seemed to linger Tuesday but eventually went away.  I have advised her that if her chest pain is prolonged for > 20 mins and she does not find relief w/ NTG after 2nd dose, take 3rd dose and call EMS. Pt voiced understanding of instructions.  She was added by scheduler to Ellen Henri' schedule tomorrow.  Routed to Dr. Percival Spanish for further advice.

## 2016-01-06 NOTE — Telephone Encounter (Signed)
Pt have been having chest pains,not at the moment.

## 2016-01-07 ENCOUNTER — Ambulatory Visit (INDEPENDENT_AMBULATORY_CARE_PROVIDER_SITE_OTHER): Payer: Medicare Other | Admitting: Cardiology

## 2016-01-07 ENCOUNTER — Ambulatory Visit: Payer: Medicare Other | Admitting: Physician Assistant

## 2016-01-07 ENCOUNTER — Encounter: Payer: Self-pay | Admitting: Cardiology

## 2016-01-07 VITALS — BP 110/72 | HR 80 | Ht 63.0 in | Wt 151.0 lb

## 2016-01-07 DIAGNOSIS — R079 Chest pain, unspecified: Secondary | ICD-10-CM | POA: Diagnosis not present

## 2016-01-07 MED ORDER — DILTIAZEM HCL ER COATED BEADS 120 MG PO CP24: 120.0000 mg | ORAL_CAPSULE | Freq: Every day | ORAL | 3 refills | Status: DC

## 2016-01-07 NOTE — Progress Notes (Signed)
01/07/2016 Amy Black   March 19, 1940  [id number]  Primary Physician [name], MD Primary Cardiologist: Dr. Percival Spanish    Reason for Visit/CC: Chest Pain  HPI:  Amy Black is a 75 year old female, followed by Dr. Percival Spanish, with a h/o presumed coronary spasms. On 08/28/14, she had a LHC that showed minimal CAD, 20% disease in the LAD with normal LVF. She also has a h/o palpitations and HTN. She has been on Varamapil for 25+ years. She is also on Imdur and recently increased her dose from 15 mg to 30 mg daily due to recurrent CP.  She continues to have her usual CP that she feels. Has been more frequent. It is sharp and severe. Episodes last several minutes. She has had relief with SL NTG but has issues with HAs. She is convinced that she is no longer responding to the [place]. She states that she has been on it for too long and is requesting that we change her to a new medication.   EKG today shows NSR. HR 80 bpm. BP is 110/72.     Current Meds  Medication Sig  . ALPRAZolam (XANAX) 0.5 MG tablet Take 1 tablet (0.5 mg total) by mouth 3 (three) times daily.  Marland Kitchen aspirin 81 MG tablet Take 1 tablet (81 mg total) by mouth 2 (two) times daily with a meal.  . Calcium Citrate-Vitamin D (CALCIUM CITRATE + D3) 315-250 MG-UNIT TABS Take 2 tablets by mouth daily.  . cetirizine (ZYRTEC) 10 MG tablet Take 1 tablet (10 mg total) by mouth 2 (two) times daily.  . cholecalciferol (VITAMIN D) 1000 units tablet Take 1,000 Units by mouth daily.  Marland Kitchen DOK 100 MG capsule TAKE ONE CAPSULE BY MOUTH TWICE DAILY.  . ferrous sulfate 325 (65 FE) MG tablet Take 1 tablet (325 mg total) by mouth daily with breakfast.  . fluticasone (FLONASE) 50 MCG/ACT nasal spray Place 2 sprays into both nostrils daily.  . isosorbide mononitrate (IMDUR) 30 MG 24 hr tablet Take 1 tablet (30 mg total) by mouth daily.  Marland Kitchen loperamide (IMODIUM) 2 MG capsule TAKE ONE CAPSULE BY MOUTH FOUR TIMES DAILY AS NEEDED FOR DIARRHEA / LOOSE STOOLS    . NITROSTAT 0.4 MG SL tablet DISSOLVE ONE TABLET UNDER TONGUE EVERY 5 MINUTES UP TO 3 DOSES AS NEEDED FOR CHEST PAIN  . pantoprazole (PROTONIX) 40 MG tablet Take 1 tablet (40 mg total) by mouth 2 (two) times daily. KEEP OV.  . promethazine (PHENERGAN) 12.5 MG tablet Take 1 tablet (12.5 mg total) by mouth every 8 (eight) hours as needed for nausea or vomiting.  . traMADol (ULTRAM) 50 MG tablet TAKE ONE TABLET BY MOUTH EVERY TWELVE HOURS AS NEEDED.  Marland Kitchen triamterene-hydrochlorothiazide (DYAZIDE) 37.5-25 MG capsule TAKE ONE CAPSULE BY MOUTH 3 TIMES PER WEEK  . verapamil (CALAN) 80 MG tablet TAKE ONE TABLET BY MOUTH FOUR TIMES DAILY   Allergies  Allergen Reactions  . Iodine Itching  . Cymbalta [Duloxetine Hcl] Other (See Comments)    sleepiness  . Gabapentin Other (See Comments)    sleepiness  . Ivp Dye [Iodinated Diagnostic Agents] Itching  . Zanaflex [Tizanidine Hcl] Other (See Comments)    Very drunk feeling next day   Past Medical History:  Diagnosis Date  . Adenocarcinoma (HCC)    LEFT LEG  . Adenocarcinoma of unknown primary ([facility]) 02/06/2011  . 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 Allergy   . Anxiety   . CAD (coronary artery  disease)   . Cataract    bilateral cataracts removed  . Coronary artery spasm ([place])   . Diverticulosis of colon (without mention of hemorrhage)   . GERD (gastroesophageal reflux disease)   . hepatic cyst   . Hip fracture, right ([place])   . History of cardiac catheterization   . History of nuclear stress test 04/2011   lexiscan; normal study, no significant ischemia, low risk   . HTN (hypertension)    PT. DENIES  . Hyperlipidemia   . Insomnia   . Irritable bowel syndrome   . Transverse myelitis ([place])   . Unstable angina ([place])   . Vitamin D deficiency    Family History  Problem Relation Age of Onset  . Bladder Cancer Mother   . Heart disease Mother   . Hypertension Mother   . Heart disease Father   . Stroke Father    . Diabetes      Multiple family members on both sides   . Coronary artery disease      Multiple family members on both sides   . Hyperlipidemia Sister   . Depression Sister   . Diabetes Brother   . Seizures Brother   . Hypertension Brother   . Colon cancer Neg Hx   . Esophageal cancer Neg Hx   . Pancreatic cancer Neg Hx   . Rectal cancer Neg Hx   . Stomach cancer Neg Hx    Past Surgical History:  Procedure Laterality Date  . ABDOMINAL HYSTERECTOMY Bilateral   . CARDIAC CATHETERIZATION     coronary spasm  . CARDIAC CATHETERIZATION N/A 08/28/2014   Procedure: Left Heart Cath and Coronary Angiography;  Surgeon: Jettie Booze, MD;  Location: [facility];  Service: Cardiovascular;  Laterality: N/A;  . COLONOSCOPY    . DIAGNOSTIC LAPAROSCOPY    . LYSIS OF ADHESION    . PELVIC LAPAROSCOPY  1992   RSO, AND LSO ON 2007  . RESECTION SOFT TISSUE TUMOR LEG / ANKLE RADICAL  2004   leg lesion resection & radiation  . SALPINGOOPHORECTOMY Left   . TOTAL HIP ARTHROPLASTY Right 10/04/2015   Procedure: TOTAL HIP ARTHROPLASTY ANTERIOR APPROACH;  Surgeon: Rod Can, MD;  Location: [facility];  Service: Orthopedics;  Laterality: Right;  . TRANSTHORACIC ECHOCARDIOGRAM  07/2012   LV cavity size mildly reduced, normal wall motion; MV with calcified annulus and mild MR; LA mildly dilated; atrial septum with increased thickness - lipomatous hypertrophy; RV systolic pressure increased (borderline pulm HTN)  . UPPER GASTROINTESTINAL ENDOSCOPY     Social History   Social History  . Marital status: Married    Spouse name: N/A  . Number of children: 2  . Years of education: N/A   Occupational History  . Disabilty   . DISABILITY Unemployed   Social History Main Topics  . Smoking status: Former Smoker    Packs/day: 0.50    Years: 4.00    Types: Cigarettes    Start date: 12/07/1968    Quit date: 11/20/1972  . Smokeless tobacco: Never Used     Comment: quit 40 years ago  . Alcohol use  No  . Drug use: No  . Sexual activity: No   Other Topics Concern  . Not on file   Social History Narrative  . No narrative on file     Review of Systems: General: negative for chills, fever, night sweats or weight changes.  Cardiovascular: negative for chest pain, dyspnea on exertion, edema, orthopnea, palpitations, paroxysmal  nocturnal dyspnea or shortness of breath Dermatological: negative for rash Respiratory: negative for cough or wheezing Urologic: negative for hematuria Abdominal: negative for nausea, vomiting, diarrhea, bright red blood per rectum, melena, or hematemesis Neurologic: negative for visual changes, syncope, or dizziness All other systems reviewed and are otherwise negative except as noted above.   Physical Exam:  Blood pressure 110/72, pulse 80, height 5\' 3"  (1.6 m), weight 151 lb (68.5 kg).  General appearance: alert, cooperative and no distress Neck: no carotid bruit and no JVD Lungs: clear to auscultation bilaterally Heart: regular rate and rhythm, S1, S2 normal, no murmur, click, rub or gallop Extremities: extremities normal, atraumatic, no cyanosis or edema Pulses: 2+ and symmetric Skin: Skin color, texture, turgor normal. No rashes or lesions Neurologic: Grossly normal  EKG NSR. HR 80 bpm   ASSESSMENT AND PLAN:   1. Chest Pain: presumed to be coronary spasms. Patient with mild 20% CAD in a single vessel on cath in 2016. Normal EF. She has been on Verapamil for 25+ years and feels that she is no longer responding to it. She is requesting that we try switching her to another agent to see if she will have a better response. Given her ? Spasms, h/o palpations and HTN, we will add Cardizem 120 mg daily. [place], [place]. We will have her f/u with her PCP in 1 week for f/u BP and pulse check to make sure she is tolerating ok. She can f/u in our clinic in 4 more weeks to reassess response to therapy. Continue Imdur.   2. Palpiations:  controlled with CCB. We will change from verapamil to Cardizem for reasons outlined above. Patient will notify us if any recurrence of symptoms.   3. HTN: controlled on current regimen. F/u with PCP in 1 week after change from verapamil to Cardizem.    Lyda Jester PA-C 01/07/2016 11:38 AM

## 2016-01-07 NOTE — Patient Instructions (Signed)
Medication Instructions:  Your physician has recommended you make the following change in your medication:  1) STOP Verapamil  2) START Cardiziem 120 mg by mouth ONCE daily   Labwork: none  Testing/Procedures: none  Follow-Up: Your physician recommends that you schedule a follow-up appointment in: 1 week with your Primary Care Provider - for a blood pressure check and EKG  Your physician recommends that you schedule a follow-up appointment in: 4 weeks in our office    Any Other Special Instructions Will Be Listed Below (If Applicable).     If you need a refill on your cardiac medications before your next appointment, please call your pharmacy.

## 2016-01-10 ENCOUNTER — Other Ambulatory Visit: Payer: Self-pay

## 2016-01-10 NOTE — Telephone Encounter (Signed)
Seen by APP Friday.

## 2016-01-12 ENCOUNTER — Other Ambulatory Visit: Payer: Self-pay | Admitting: Family Medicine

## 2016-01-12 DIAGNOSIS — M7061 Trochanteric bursitis, right hip: Secondary | ICD-10-CM | POA: Diagnosis not present

## 2016-01-13 DIAGNOSIS — M7061 Trochanteric bursitis, right hip: Secondary | ICD-10-CM | POA: Diagnosis not present

## 2016-01-17 ENCOUNTER — Telehealth: Payer: Self-pay | Admitting: Cardiology

## 2016-01-17 ENCOUNTER — Telehealth: Payer: Self-pay | Admitting: Family Medicine

## 2016-01-17 ENCOUNTER — Other Ambulatory Visit: Payer: Self-pay | Admitting: *Deleted

## 2016-01-17 MED ORDER — TRAMADOL HCL 50 MG PO TABS: ORAL_TABLET | ORAL | 3 refills | Status: DC

## 2016-01-17 NOTE — Telephone Encounter (Signed)
Please contact the patient . I am happy to refill her medication, however it has been 3 months since I saw her. As regulating controlled substances such as tramadol make it necessary that we see you in order to prescribe the medication

## 2016-01-17 NOTE — Telephone Encounter (Signed)
Spoke with pt and advised she would ntbs specifically for this reason, management of pain. Pt voiced understanding and states she will call back to schedule.

## 2016-01-17 NOTE — Telephone Encounter (Signed)
New message        *STAT* If patient is at the pharmacy, call can be transferred to refill team.   1. Which medications need to be refilled? (please list name of each medication and dose if known) tramadol 50mg  2. Which pharmacy/location (including street and city if local pharmacy) is medication to be sent to? [facility] in [place] 3. Do they need a 30 day or 90 day supply? 30 day supply

## 2016-01-20 DIAGNOSIS — G5791 Unspecified mononeuropathy of right lower limb: Secondary | ICD-10-CM | POA: Diagnosis not present

## 2016-01-25 ENCOUNTER — Ambulatory Visit (INDEPENDENT_AMBULATORY_CARE_PROVIDER_SITE_OTHER): Payer: Medicare Other | Admitting: Family Medicine

## 2016-01-25 ENCOUNTER — Encounter: Payer: Self-pay | Admitting: Family Medicine

## 2016-01-25 VITALS — BP 131/72 | HR 83 | Temp 97.1°F | Ht 63.0 in | Wt 157.0 lb

## 2016-01-25 DIAGNOSIS — I10 Essential (primary) hypertension: Secondary | ICD-10-CM

## 2016-01-25 DIAGNOSIS — F419 Anxiety disorder, unspecified: Secondary | ICD-10-CM

## 2016-01-25 MED ORDER — ALPRAZOLAM 0.5 MG PO TABS: 0.5000 mg | ORAL_TABLET | Freq: Three times a day (TID) | ORAL | 5 refills | Status: DC

## 2016-01-25 MED ORDER — AMOXICILLIN-POT CLAVULANATE 875-125 MG PO TABS: 1.0000 | ORAL_TABLET | Freq: Two times a day (BID) | ORAL | 0 refills | Status: DC

## 2016-01-25 MED ORDER — TRAMADOL HCL 50 MG PO TABS: ORAL_TABLET | ORAL | 5 refills | Status: DC

## 2016-01-25 NOTE — Telephone Encounter (Signed)
Returned call to patient no answer.LMTC. 

## 2016-01-25 NOTE — Telephone Encounter (Signed)
Pt is returning call she can reached on her cell [phone]

## 2016-01-25 NOTE — Telephone Encounter (Signed)
LMTCB

## 2016-01-25 NOTE — Telephone Encounter (Signed)
°  New Prob   Pt has some questions regarding her Cardizem medication. Pt feels dosage needs to be increased. Pt states "I started having chest pain when the dosage of my Cardizem was decreased". Please call.     Pt c/o of Chest Pain: STAT if CP now or developed within 24 hours  1. Are you having CP right now? No  2. Are you experiencing any other symptoms (ex. SOB, nausea, vomiting, sweating)? No  3. How long have you been experiencing CP? 4 days  4. Is your CP continuous or coming and going? Intermittent   5. Have you taken Nitroglycerin? Yes ?

## 2016-01-25 NOTE — Progress Notes (Signed)
Subjective:  Patient ID: Amy Black, female    DOB: Jun 26, 1940  Age: 75 y.o. MRN: [id number]  CC: Medication Refill (pt here today for her refills on Tramadol and Xanax and also c/o sinus pressure)   HPI Amy Black presents for recheck of anxiety. GAD 7 score reflects sx when adequately treated with med. Would be 2-3 on most if off med, but tries not to miss.  GAD 7 : Generalized Anxiety Score 01/25/2016  Nervous, Anxious, on Edge 0  Control/stop worrying 0  Worry too much - different things 0  Trouble relaxing 0  Restless 0  Easily annoyed or irritable 0  Afraid - awful might happen 0  Total GAD 7 Score 0  Anxiety Difficulty Not difficult at all    Pain is 10/10 at times. Last night couldn't sleep due to R hip pain. Baseline is 6/10. Tramadol takes the edge off. Pt. Also having congestion, facial pain, nasal congestion, no  fever, non productive cough, post nasal drip and sinus pressure with no fever, chills, night sweats or weight loss. Onset of symptoms was a few days ago, gradually worsening since that time. Pt.is drinking moderate amounts of fluids.     History Amy Black has a past medical history of Adenocarcinoma ([facility]); Adenocarcinoma of unknown primary ([facility]) (02/06/2011); Adenosquamous carcinoma; Allergy; Anxiety; CAD (coronary artery disease); Cataract; Coronary artery spasm ([place]); Diverticulosis of colon (without mention of hemorrhage); GERD (gastroesophageal reflux disease); hepatic cyst; Hip fracture, right ([facility]); History of cardiac catheterization; History of nuclear stress test (04/2011); HTN (hypertension); Hyperlipidemia; Insomnia; Irritable bowel syndrome; Transverse myelitis ([place]); Unstable angina (HCC); and Vitamin D deficiency.   She has a past surgical history that includes Resection soft tissue tumor leg / ankle radical (2004); Diagnostic laparoscopy; Lysis of adhesion; Salpingoophorectomy (Left); Pelvic laparoscopy (1992); Cardiac catheterization;  transthoracic echocardiogram (07/2012); Cardiac catheterization (N/A, 08/28/2014); Colonoscopy; Upper gastrointestinal endoscopy; Total hip arthroplasty (Right, 10/04/2015); and Abdominal hysterectomy (Bilateral).   Her family history includes Bladder Cancer in her mother; Depression in her sister; Diabetes in her brother; Heart disease in her father and mother; Hyperlipidemia in her sister; Hypertension in her brother and mother; Seizures in her brother; Stroke in her father.She reports that she quit smoking about 43 years ago. Her smoking use included Cigarettes. She started smoking about 47 years ago. She has a 2.00 pack-year smoking history. She has never used smokeless tobacco. She reports that she does not drink alcohol or use drugs.    ROS Review of Systems  Constitutional: Negative for activity change, appetite change, chills and fever.  HENT: Positive for congestion, postnasal drip, rhinorrhea and sinus pressure. Negative for ear discharge, ear pain, hearing loss, nosebleeds, sneezing, sore throat and trouble swallowing.   Eyes: Negative for visual disturbance.  Respiratory: Negative for cough, chest tightness and shortness of breath.   Cardiovascular: Negative for chest pain and palpitations.  Gastrointestinal: Negative for abdominal pain, diarrhea and nausea.  Genitourinary: Negative for dysuria.  Musculoskeletal: Negative for arthralgias and myalgias.  Skin: Negative for rash.    Objective:  BP 131/72   Pulse 83   Temp 97.1 F (36.2 C) (Oral)   Ht 5\' 3"  (1.6 m)   Wt 157 lb (71.2 kg)   BMI 27.81 kg/m   BP Readings from Last 3 Encounters:  02/02/16 (!) 151/92  01/28/16 (!) 156/78  01/25/16 131/72    Wt Readings from Last 3 Encounters:  02/02/16 153 lb (69.4 kg)  01/28/16 155 lb 12.8 oz (70.7 kg)  01/25/16 157 lb (71.2 kg)     Physical Exam  Constitutional: She is oriented to person, place, and time. She appears well-developed and well-nourished. No distress.  HENT:    Head: Normocephalic and atraumatic.  Right Ear: Tympanic membrane and external ear normal. No decreased hearing is noted.  Left Ear: Tympanic membrane and external ear normal. No decreased hearing is noted.  Nose: Mucosal edema present. Right sinus exhibits no frontal sinus tenderness. Left sinus exhibits no frontal sinus tenderness.  Mouth/Throat: Oropharynx is clear and moist. No oropharyngeal exudate or posterior oropharyngeal erythema.  Eyes: Conjunctivae and EOM are normal. Pupils are equal, round, and reactive to light.  Neck: Normal range of motion. Neck supple. No Brudzinski's sign noted. No thyromegaly present.  Cardiovascular: Normal rate, regular rhythm and normal heart sounds.   No murmur heard. Pulmonary/Chest: Effort normal and breath sounds normal. No respiratory distress. She has no wheezes. She has no rales.  Abdominal: Soft. Bowel sounds are normal. She exhibits no distension. There is no tenderness.  Lymphadenopathy:       Head (right side): No preauricular adenopathy present.       Head (left side): No preauricular adenopathy present.    She has no cervical adenopathy.       Right cervical: No superficial cervical adenopathy present.      Left cervical: No superficial cervical adenopathy present.  Neurological: She is alert and oriented to person, place, and time. She has normal reflexes.  Skin: Skin is warm and dry.  Psychiatric: She has a normal mood and affect. Her behavior is normal. Judgment and thought content normal.     Lab Results  Component Value Date   WBC 6.6 01/28/2016   HGB 11.5 (L) 01/28/2016   HCT 35.3 01/28/2016   PLT 455 Platelet count confirmed by slide estimate (H) 01/28/2016   GLUCOSE 86 01/28/2016   CHOL 213 (H) 06/26/2015   TRIG 73 06/26/2015   HDL 86 06/26/2015   LDLCALC 112 (H) 06/26/2015   ALT 12 01/28/2016   AST 13 01/28/2016   NA 138 01/28/2016   K 4.2 01/28/2016   CL 106 10/06/2015   CREATININE 0.8 01/28/2016   BUN 10.0  01/28/2016   CO2 30 (H) 01/28/2016   TSH 2.010 06/26/2015   INR 1.13 10/03/2015    Dg Wrist Complete Right  Result Date: 10/03/2015 CLINICAL DATA:  Wrist pain after falling today.  Initial encounter. EXAM: RIGHT WRIST - COMPLETE 3+ VIEW COMPARISON:  None. FINDINGS: The mineralization and alignment are normal. There is no evidence of acute fracture or dislocation. There are minimal degenerative changes for age. There is an ulnar minus variance at the wrist. No focal soft tissue abnormalities are seen. IMPRESSION: No evidence of acute fracture or dislocation. Electronically Signed   By: Richardean Sale M.D.   On: 10/03/2015 17:04   Ct Wrist Right Wo Contrast  Result Date: 10/03/2015 CLINICAL DATA:  Patient fell and caught herself in the right wrist. Pain in the distal radius with possible fracture. EXAM: CT OF THE RIGHT WRIST WITHOUT CONTRAST TECHNIQUE: Multidetector CT imaging of the right wrist was performed according to the standard protocol. Multiplanar CT image reconstructions were also generated. COMPARISON:  Right wrist radiographs 10/03/2015 FINDINGS: Bones/Joint/Cartilage Carpal bones, visualized metacarpal bones, and visualized distal radius and ulna appear intact. No acute fractures are demonstrated. MRI would be more sensitive for detection of bone bruises or other bony injury if clinically indicated. Tiny amount of gas demonstrated in the lunate  toe capitate joint, likely degenerative. No focal bone lesion or bone destruction. Ligaments Suboptimally assessed by CT. Muscles and Tendons No intramuscular hematoma or fluid collection demonstrated. Soft tissues No significant soft tissue infiltration or swelling. IMPRESSION: No acute fracture or dislocation demonstrated in the wrist. Mild degenerative changes are present. Electronically Signed   By: Lucienne Capers M.D.   On: 10/03/2015 21:22   Pelvis Portable  Result Date: 10/04/2015 CLINICAL DATA:  Status post right hip arthroplasty. EXAM:  PORTABLE PELVIS 1-2 VIEWS COMPARISON:  Same day. FINDINGS: The femoral and acetabular components appear to be well situated. No fracture or dislocation is noted. Expected postoperative changes are seen in the surrounding soft tissues. IMPRESSION: Status post right total hip arthroplasty. Electronically Signed   By: Marijo Conception, M.D.   On: 10/04/2015 15:49   Dg Knee Right Port  Result Date: 10/03/2015 CLINICAL DATA:  Femoral neck fracture. EXAM: PORTABLE RIGHT KNEE - 1-2 VIEW COMPARISON:  None. FINDINGS: No evidence of fracture, dislocation, or joint effusion. There are 3 compartment osteoarthritic changes of the right knee, worse at the patellofemoral compartment. Well corticated calcific density anterior to the distal femur may represent calcific tendinitis or intracapsular loose body. Soft tissues are otherwise unremarkable. IMPRESSION: No acute fracture or dislocation identified about the right Knee. Three compartment osteoarthritis, worse at the patellofemoral compartment. Electronically Signed   By: Fidela Salisbury M.D.   On: 10/03/2015 18:45   Dg C-arm 1-60 Min  Result Date: 10/04/2015 CLINICAL DATA:  Right follow-up arthroplasty, anterior approach. EXAM: DG C-ARM 61-120 MIN; OPERATIVE RIGHT HIP WITH PELVIS COMPARISON:  10/03/2015. FINDINGS: Three intraoperative fluoroscopic spot views of the right hip initially show a reamer in the proximal right femur with the acetabular cup in place. Subsequent images show right hip arthroplasty in anatomic position. IMPRESSION: Intraoperative visualization of a right hip arthroplasty. Electronically Signed   By: Lorin Picket M.D.   On: 10/04/2015 14:14   Dg Hip Operative Unilat W Or W/o Pelvis Right  Result Date: 10/04/2015 CLINICAL DATA:  Right follow-up arthroplasty, anterior approach. EXAM: DG C-ARM 61-120 MIN; OPERATIVE RIGHT HIP WITH PELVIS COMPARISON:  10/03/2015. FINDINGS: Three intraoperative fluoroscopic spot views of the right hip initially  show a reamer in the proximal right femur with the acetabular cup in place. Subsequent images show right hip arthroplasty in anatomic position. IMPRESSION: Intraoperative visualization of a right hip arthroplasty. Electronically Signed   By: Lorin Picket M.D.   On: 10/04/2015 14:14   Dg Hip Unilat With Pelvis 2-3 Views Right  Result Date: 10/03/2015 CLINICAL DATA:  Right hip pain status post fall. EXAM: DG HIP (WITH OR WITHOUT PELVIS) 2-3V RIGHT COMPARISON:  None. FINDINGS: There is an impacted subcapital right femoral neck fracture with minimal medial displacement of the distal fracture fragment. No other fractures are seen. Soft tissues are grossly normal. IMPRESSION: Impacted subcapital right femoral neck fracture. Electronically Signed   By: Fidela Salisbury M.D.   On: 10/03/2015 17:03    Assessment & Plan:   Amy Black was seen today for medication refill.  Diagnoses and all orders for this visit:  Essential hypertension  Anxiety  Other orders -     ALPRAZolam (XANAX) 0.5 MG tablet; Take 1 tablet (0.5 mg total) by mouth 3 (three) times daily. -     traMADol (ULTRAM) 50 MG tablet; TAKE ONE TABLET BY MOUTH EVERY TWELVE HOURS AS NEEDED. -     amoxicillin-clavulanate (AUGMENTIN) 875-125 MG tablet; Take 1 tablet by mouth 2 (two)  times daily. Take all of this medication      I am having Amy Black start on amoxicillin-clavulanate. I am also having her maintain her cetirizine, fluticasone, NITROSTAT, pantoprazole, loperamide, aspirin, triamterene-hydrochlorothiazide, cholecalciferol, Calcium Citrate-Vitamin D, promethazine, isosorbide mononitrate, ALPRAZolam, and traMADol.  Meds ordered this encounter  Medications  . ALPRAZolam (XANAX) 0.5 MG tablet    Sig: Take 1 tablet (0.5 mg total) by mouth 3 (three) times daily.    Dispense:  90 tablet    Refill:  5  . traMADol (ULTRAM) 50 MG tablet    Sig: TAKE ONE TABLET BY MOUTH EVERY TWELVE HOURS AS NEEDED.    Dispense:  60 tablet     Refill:  5  . amoxicillin-clavulanate (AUGMENTIN) 875-125 MG tablet    Sig: Take 1 tablet by mouth 2 (two) times daily. Take all of this medication    Dispense:  20 tablet    Refill:  0     Follow-up: Return in about 6 months (around 07/25/2016).  [name], M.D.

## 2016-01-26 NOTE — Telephone Encounter (Signed)
°  Follow Up ° ° °Pt is returning call from yesterday. Please call. °

## 2016-01-26 NOTE — Telephone Encounter (Signed)
Returned call to patient.She stated she continues to have occasional chest pain.No chest pain at present.Appointment offered tomorrow 01/27/16 with a PA, but patient stated she wanted to wait until after Christmas.Appointment scheduled with Amy Deforest PA 02/02/16 at 2:30 pm.Advised to go to ER if needed.

## 2016-01-28 ENCOUNTER — Other Ambulatory Visit (HOSPITAL_BASED_OUTPATIENT_CLINIC_OR_DEPARTMENT_OTHER): Payer: Medicare Other

## 2016-01-28 ENCOUNTER — Telehealth: Payer: Self-pay | Admitting: Cardiology

## 2016-01-28 ENCOUNTER — Ambulatory Visit (HOSPITAL_BASED_OUTPATIENT_CLINIC_OR_DEPARTMENT_OTHER): Payer: Medicare Other | Admitting: Family

## 2016-01-28 VITALS — BP 156/78 | HR 64 | Temp 97.8°F | Resp 20 | Wt 155.8 lb

## 2016-01-28 DIAGNOSIS — G373 Acute transverse myelitis in demyelinating disease of central nervous system: Secondary | ICD-10-CM

## 2016-01-28 DIAGNOSIS — Z862 Personal history of diseases of the blood and blood-forming organs and certain disorders involving the immune mechanism: Secondary | ICD-10-CM

## 2016-01-28 DIAGNOSIS — D75839 Thrombocytosis, unspecified: Secondary | ICD-10-CM

## 2016-01-28 DIAGNOSIS — Z859 Personal history of malignant neoplasm, unspecified: Secondary | ICD-10-CM

## 2016-01-28 DIAGNOSIS — D508 Other iron deficiency anemias: Secondary | ICD-10-CM

## 2016-01-28 DIAGNOSIS — C801 Malignant (primary) neoplasm, unspecified: Secondary | ICD-10-CM

## 2016-01-28 DIAGNOSIS — I10 Essential (primary) hypertension: Secondary | ICD-10-CM | POA: Diagnosis not present

## 2016-01-28 DIAGNOSIS — D473 Essential (hemorrhagic) thrombocythemia: Secondary | ICD-10-CM

## 2016-01-28 LAB — CBC WITH DIFFERENTIAL (CANCER CENTER ONLY)
BASO#: 0.1 10*3/uL (ref 0.0–0.2)
BASO%: 0.8 % (ref 0.0–2.0)
EOS%: 2.3 % (ref 0.0–7.0)
Eosinophils Absolute: 0.2 10*3/uL (ref 0.0–0.5)
HCT: 35.3 % (ref 34.8–46.6)
HGB: 11.5 g/dL — ABNORMAL LOW (ref 11.6–15.9)
LYMPH#: 1.8 10*3/uL (ref 0.9–3.3)
LYMPH%: 26.7 % (ref 14.0–48.0)
MCH: 30.3 pg (ref 26.0–34.0)
MCHC: 32.6 g/dL (ref 32.0–36.0)
MCV: 93 fL (ref 81–101)
MONO#: 0.6 10*3/uL (ref 0.1–0.9)
MONO%: 8.9 % (ref 0.0–13.0)
NEUT#: 4.1 10*3/uL (ref 1.5–6.5)
NEUT%: 61.3 % (ref 39.6–80.0)
Platelets: 455 10*3/uL — ABNORMAL HIGH (ref 145–400)
RBC: 3.8 10*6/uL (ref 3.70–5.32)
RDW: 14.9 % (ref 11.1–15.7)
WBC: 6.6 10*3/uL (ref 3.9–10.0)

## 2016-01-28 LAB — COMPREHENSIVE METABOLIC PANEL
ALT: 12 U/L (ref 0–55)
AST: 13 U/L (ref 5–34)
Albumin: 3.9 g/dL (ref 3.5–5.0)
Alkaline Phosphatase: 123 U/L (ref 40–150)
Anion Gap: 7 mEq/L (ref 3–11)
BUN: 10 mg/dL (ref 7.0–26.0)
CO2: 30 mEq/L — ABNORMAL HIGH (ref 22–29)
Calcium: 10 mg/dL (ref 8.4–10.4)
Chloride: 102 mEq/L (ref 98–109)
Creatinine: 0.8 mg/dL (ref 0.6–1.1)
EGFR: >90 mL/min/{1.73_m2} (ref >90)
Glucose: 86 mg/dl (ref 70–140)
Potassium: 4.2 mEq/L (ref 3.5–5.1)
Sodium: 138 mEq/L (ref 136–145)
Total Bilirubin: 0.51 mg/dL (ref 0.20–1.20)
Total Protein: 7.5 g/dL (ref 6.4–8.3)

## 2016-01-28 NOTE — Progress Notes (Signed)
Hematology and Oncology Follow Up Visit  Obie Scragg Leighton [id number] [birth year] 75 y.o. 01/28/2016   Principle Diagnosis:  Transient thrombocytosis History transverse myelitis History of adenosquamous carcinoma of unknown primary  Current Therapy:   Observation    Interim History:  Ms. Hady is here today for a follow-up. She is doing fairly well. She is no longer having diarrhea.  She did have a fall in August and fractured her hip. She had a total right hip replacement at that time. She has had some issues with the right foot and is followed closely by ortho. She has had no weakness, swelling, numbness or tingling in her extremities. No new falls or syncopal episodes.  She had a change in her antihypertensive medication and is having headaches that come and go. Her BP has also been a bit more elevated, 156/78 today. She has notified her cardiologist of this and will see them on December 26th for possible medication adjustment.  Hgb is stable at 11.5 with an MCV of 93. Platelet count is holding at 455. WBC count is 6.6. She has had no problem with frequent infections.  No fever, chills, n/v, rash, dizziness, SOB, palpitations, abdominal pain or changes in bowel or bladder habits.  No lymphadenopathy found on exam. No episodes of bleeding or bruising.  She has maintained a good appetite and is staying well hydrated. Her weight is stable.   Medications:  Allergies as of 01/28/2016      Reactions   Iodine Itching   Cymbalta [duloxetine Hcl] Other (See Comments)   sleepiness   Gabapentin Other (See Comments)   sleepiness   Ivp Dye [iodinated Diagnostic Agents] Itching   Zanaflex [tizanidine Hcl] Other (See Comments)   Very drunk feeling next day      Medication List       Accurate as of 01/28/16  1:12 PM. Always use your most recent med list.          ALPRAZolam 0.5 MG tablet Commonly known as:  XANAX Take 1 tablet (0.5 mg total) by mouth 3 (three) times daily.     amoxicillin-clavulanate 875-125 MG tablet Commonly known as:  AUGMENTIN Take 1 tablet by mouth 2 (two) times daily. Take all of this medication   aspirin 81 MG tablet Take 1 tablet (81 mg total) by mouth 2 (two) times daily with a meal.   Calcium Citrate-Vitamin D 315-250 MG-UNIT Tabs Commonly known as:  CALCIUM CITRATE + D3 Take 2 tablets by mouth daily.   cetirizine 10 MG tablet Commonly known as:  ZYRTEC Take 1 tablet (10 mg total) by mouth 2 (two) times daily.   cholecalciferol 1000 units tablet Commonly known as:  VITAMIN D Take 1,000 Units by mouth daily.   diltiazem 120 MG 24 hr capsule Commonly known as:  CARDIZEM CD Take 1 capsule (120 mg total) by mouth daily.   DOK 100 MG capsule Generic drug:  docusate sodium TAKE ONE CAPSULE BY MOUTH TWICE DAILY.   ferrous sulfate 325 (65 FE) MG tablet Take 1 tablet (325 mg total) by mouth daily with breakfast.   fluticasone 50 MCG/ACT nasal spray Commonly known as:  FLONASE Place 2 sprays into both nostrils daily.   isosorbide mononitrate 30 MG 24 hr tablet Commonly known as:  IMDUR Take 1 tablet (30 mg total) by mouth daily.   loperamide 2 MG capsule Commonly known as:  IMODIUM TAKE ONE CAPSULE BY MOUTH FOUR TIMES DAILY AS NEEDED FOR DIARRHEA / LOOSE STOOLS   NITROSTAT  0.4 MG SL tablet Generic drug:  nitroGLYCERIN DISSOLVE ONE TABLET UNDER TONGUE EVERY 5 MINUTES UP TO 3 DOSES AS NEEDED FOR CHEST PAIN   pantoprazole 40 MG tablet Commonly known as:  PROTONIX Take 1 tablet (40 mg total) by mouth 2 (two) times daily. KEEP OV.   promethazine 12.5 MG tablet Commonly known as:  PHENERGAN Take 1 tablet (12.5 mg total) by mouth every 8 (eight) hours as needed for nausea or vomiting.   traMADol 50 MG tablet Commonly known as:  ULTRAM TAKE ONE TABLET BY MOUTH EVERY TWELVE HOURS AS NEEDED.   triamterene-hydrochlorothiazide 37.5-25 MG capsule Commonly known as:  DYAZIDE TAKE ONE CAPSULE BY MOUTH 3 TIMES PER WEEK        Allergies:  Allergies  Allergen Reactions  . Iodine Itching  . Cymbalta [Duloxetine Hcl] Other (See Comments)    sleepiness  . Gabapentin Other (See Comments)    sleepiness  . Ivp Dye [Iodinated Diagnostic Agents] Itching  . Zanaflex [Tizanidine Hcl] Other (See Comments)    Very drunk feeling next day    Past Medical History, Surgical history, Social history, and Family History were reviewed and updated.  Review of Systems: All other 10 point review of systems is negative.   Physical Exam:  vitals were not taken for this visit.  Wt Readings from Last 3 Encounters:  01/25/16 157 lb (71.2 kg)  01/07/16 151 lb (68.5 kg)  12/08/15 155 lb (70.3 kg)    Ocular: Sclerae unicteric, pupils equal, round and reactive to light Ear-nose-throat: Oropharynx clear, dentition fair Lymphatic: No cervical supraclavicular or axillary adenopathy Lungs no rales or rhonchi, good excursion bilaterally Heart regular rate and rhythm, no murmur appreciated Abd soft, nontender, positive bowel sounds, no liver or spleen tip palpated on exam MSK no focal spinal tenderness, no joint edema Neuro: non-focal, well-oriented, appropriate affect Breasts: Deferred  Lab Results  Component Value Date   WBC 6.6 12/07/2015   HGB 8.5 (L) 10/06/2015   HCT 34.0 12/07/2015   MCV 92 12/07/2015   PLT 461 (H) 12/07/2015   Lab Results  Component Value Date   FERRITIN 83 07/23/2015   IRON 50 07/23/2015   TIBC 248 07/23/2015   UIBC 198 07/23/2015   IRONPCTSAT 20 (L) 07/23/2015   Lab Results  Component Value Date   RETICCTPCT 1.6 09/10/2013   RBC 3.70 (L) 12/07/2015   No results found for: KPAFRELGTCHN, LAMBDASER, KAPLAMBRATIO No results found for: IGGSERUM, IGA, IGMSERUM No results found for: Odetta Pink, SPEI   Chemistry      Component Value Date/Time   NA 139 10/06/2015 0506   NA 136 07/23/2015 1352   K 4.3 10/06/2015 0506   K 3.8  07/23/2015 1352   CL 106 10/06/2015 0506   CL 101 07/20/2014 1131   CO2 28 10/06/2015 0506   CO2 27 07/23/2015 1352   BUN 7 10/06/2015 0506   BUN 13.8 07/23/2015 1352   CREATININE 0.81 10/06/2015 0506   CREATININE 1.1 07/23/2015 1352      Component Value Date/Time   CALCIUM 8.8 (L) 10/06/2015 0506   CALCIUM 9.6 07/23/2015 1352   ALKPHOS 86 07/23/2015 1352   AST 29 07/23/2015 1352   ALT 25 07/23/2015 1352   BILITOT <0.30 07/23/2015 1352     Impression and Plan: Ms. Pellerito is a 75 year old African American female with history of transient thrombocytosis and also history of adenosquamous carcinoma with unknown primary. So far, there is  no evidence of recurrence.  Unfortunately, she had a fall over the summer that resulted in a fractured right hip and total hip replacement.  Her CBC has remained stable. She has had no problem with infection.  She follows up with cardiology next week regarding HTN and HA.  We will continue to follow along with her and plan to see her back in 6 months for repeat lab work and follow-up.  She will contact our office with any questions or concerns. We can certainly see her sooner if need be.   Eliezer Bottom, NP 12/22/20171:12 PM

## 2016-01-28 NOTE — Telephone Encounter (Signed)
PT walked into clinic 20 minutes after she called. Pt states that she has had a headache for the past week. She is coming here after her Oncology appt where her BP was 156/78 hr 64. BP now here in the office 146/72 HR 86. Pt states that she was here for appt with Lyda Jester 01-07-16 and her medication was changed from verapamil 80mg  4 times daily to Diltiazem 120mg  CD daily. She states that she thinks this is related to her medication change she states that her pain level is 10/10. Pt states that she is taking her medications as ordered and tak taken tylenol 1000mg  "a couple times this week" and it didn't work on her headache.  All data above relayed to Dr Percival Spanish and his direction is for pt to go to Urgent care for work-up for her headache as he states that this is not cardiac related or due to recent medication change  Pt notified of MD  Direction verbalizes understanding and states that she does not go to ER or urgent care she states that she will go to Pcp to see what he has to say

## 2016-01-28 NOTE — Telephone Encounter (Signed)
Pt is experiencing a major headache with the new medication and bp is up

## 2016-01-28 NOTE — Telephone Encounter (Signed)
lmtcb

## 2016-02-01 ENCOUNTER — Other Ambulatory Visit: Payer: Self-pay | Admitting: Pediatrics

## 2016-02-01 ENCOUNTER — Other Ambulatory Visit: Payer: Self-pay | Admitting: Family Medicine

## 2016-02-01 DIAGNOSIS — R1030 Lower abdominal pain, unspecified: Secondary | ICD-10-CM

## 2016-02-01 DIAGNOSIS — K5901 Slow transit constipation: Secondary | ICD-10-CM

## 2016-02-01 LAB — IRON AND TIBC
%SAT: 32 % (ref 21–57)
Iron: 81 ug/dL (ref 41–142)
TIBC: 254 ug/dL (ref 236–444)
UIBC: 173 ug/dL (ref 120–384)

## 2016-02-01 LAB — FERRITIN: Ferritin: 61 ng/ml (ref 9–269)

## 2016-02-02 ENCOUNTER — Encounter: Payer: Self-pay | Admitting: Physician Assistant

## 2016-02-02 ENCOUNTER — Ambulatory Visit (INDEPENDENT_AMBULATORY_CARE_PROVIDER_SITE_OTHER): Payer: Medicare Other | Admitting: Physician Assistant

## 2016-02-02 VITALS — BP 151/92 | HR 74 | Ht 63.0 in | Wt 153.0 lb

## 2016-02-02 DIAGNOSIS — I25111 Atherosclerotic heart disease of native coronary artery with angina pectoris with documented spasm: Secondary | ICD-10-CM | POA: Diagnosis not present

## 2016-02-02 DIAGNOSIS — I10 Essential (primary) hypertension: Secondary | ICD-10-CM | POA: Diagnosis not present

## 2016-02-02 MED ORDER — DILTIAZEM HCL ER COATED BEADS 240 MG PO CP24: 240.0000 mg | ORAL_CAPSULE | Freq: Every day | ORAL | 3 refills | Status: DC

## 2016-02-02 NOTE — Progress Notes (Addendum)
Cardiology Office Note    Date:  02/02/2016   ID:  Amy Black, DOB 15-Aug-1940, MRN [id number]  PCP:  Amy Fraise, MD  Cardiologist:  Dr. Percival Black  Chief Complaint  Patient presents with  . Follow-up    seen for Dr. Percival Black, episodic chest pain, 1 month followup after medication adjustment    History of Present Illness:  Amy Black is a 75 y.o. female will follow-up with Dr. Percival Black for history of presumed coronary spasm. On 08/28/2014, she had left heart cath that showed minimal CAD, 20% disease in LAD was normal LVEF. He also had history of palpitations and hypertension. She has been on verapamil for greater than 25 years. She is also Imdur and recently increased her dose from 15 mg to 30 mg daily due to recurrent chest pain.   Her last office visit was on 01/07/2016, she was still having her chest discomfort at the time she was becoming more frequent. She was concerned that she has been on verapamil for too long and she is no longer responding to it. Her verapamil was changed to Cardizem. Based on the office note by her oncologist on 01/28/2016, she was having some headache and also elevated blood pressure after switching to Cardizem. She presents today for 1 month follow-up.  According to the patient, since she was switched to Cardizem, she has not had another episode of chest discomfort for the past 3 weeks. However initially she did notice increased episode of headache and she usually never had headaches before. Later she attributed to headache due to sinus issues. She says it did improve after she received a course of Augmentin. She also noticed her blood pressure has been elevated lately roughly ranging in the [id number] systolic.   Past Medical History:  Diagnosis Date  . Adenocarcinoma (HCC)    LEFT LEG  . Adenocarcinoma of unknown primary ([place]) 02/06/2011  . 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 Allergy   . Anxiety   . CAD (coronary  artery disease)   . Cataract    bilateral cataracts removed  . Coronary artery spasm ([place])   . Diverticulosis of colon (without mention of hemorrhage)   . GERD (gastroesophageal reflux disease)   . hepatic cyst   . Hip fracture, right ([place])   . History of cardiac catheterization   . History of nuclear stress test 04/2011   lexiscan; normal study, no significant ischemia, low risk   . HTN (hypertension)    PT. DENIES  . Hyperlipidemia   . Insomnia   . Irritable bowel syndrome   . Transverse myelitis ([place])   . Unstable angina ([place])   . Vitamin D deficiency     Past Surgical History:  Procedure Laterality Date  . ABDOMINAL HYSTERECTOMY Bilateral   . CARDIAC CATHETERIZATION     coronary spasm  . CARDIAC CATHETERIZATION N/A 08/28/2014   Procedure: Left Heart Cath and Coronary Angiography;  Surgeon: Amy Booze, MD;  Location: [facility];  Service: Cardiovascular;  Laterality: N/A;  . COLONOSCOPY    . DIAGNOSTIC LAPAROSCOPY    . LYSIS OF ADHESION    . PELVIC LAPAROSCOPY  1992   RSO, AND LSO ON 2007  . RESECTION SOFT TISSUE TUMOR LEG / ANKLE RADICAL  2004   leg lesion resection & radiation  . SALPINGOOPHORECTOMY Left   . TOTAL HIP ARTHROPLASTY Right 10/04/2015   Procedure: TOTAL HIP ARTHROPLASTY ANTERIOR APPROACH;  Surgeon: Amy Can,  MD;  Location: [facility];  Service: Orthopedics;  Laterality: Right;  . TRANSTHORACIC ECHOCARDIOGRAM  07/2012   LV cavity size mildly reduced, normal wall motion; MV with calcified annulus and mild MR; LA mildly dilated; atrial septum with increased thickness - lipomatous hypertrophy; RV systolic pressure increased (borderline pulm HTN)  . UPPER GASTROINTESTINAL ENDOSCOPY      Current Medications: Outpatient Medications Prior to Visit  Medication Sig Dispense Refill  . ALPRAZolam (XANAX) 0.5 MG tablet Take 1 tablet (0.5 mg total) by mouth 3 (three) times daily. 90 tablet 5  . amoxicillin-clavulanate (AUGMENTIN) 875-125 MG tablet Take  1 tablet by mouth 2 (two) times daily. Take all of this medication 20 tablet 0  . aspirin 81 MG tablet Take 1 tablet (81 mg total) by mouth 2 (two) times daily with a meal. 30 tablet   . Calcium Citrate-Vitamin D (CALCIUM CITRATE + D3) 315-250 MG-UNIT TABS Take 2 tablets by mouth daily. 120 tablet   . cetirizine (ZYRTEC) 10 MG tablet Take 1 tablet (10 mg total) by mouth 2 (two) times daily. 30 tablet 0  . cholecalciferol (VITAMIN D) 1000 units tablet Take 1,000 Units by mouth daily.    Marland Kitchen DOK 100 MG capsule TAKE ONE CAPSULE BY MOUTH TWICE DAILY. 60 capsule 5  . ferrous sulfate 325 (65 FE) MG tablet TAKE ONE TABLET BY MOUTH DAILY WITH BREAKFAST 30 tablet 5  . fluticasone (FLONASE) 50 MCG/ACT nasal spray Place 2 sprays into both nostrils daily. 16 g 6  . isosorbide mononitrate (IMDUR) 30 MG 24 hr tablet Take 1 tablet (30 mg total) by mouth daily. 30 tablet 6  . loperamide (IMODIUM) 2 MG capsule TAKE ONE CAPSULE BY MOUTH FOUR TIMES DAILY AS NEEDED FOR DIARRHEA / LOOSE STOOLS 120 capsule 3  . NITROSTAT 0.4 MG SL tablet DISSOLVE ONE TABLET UNDER TONGUE EVERY 5 MINUTES UP TO 3 DOSES AS NEEDED FOR CHEST PAIN 25 tablet 1  . pantoprazole (PROTONIX) 40 MG tablet Take 1 tablet (40 mg total) by mouth 2 (two) times daily. KEEP OV. 180 tablet 0  . promethazine (PHENERGAN) 12.5 MG tablet Take 1 tablet (12.5 mg total) by mouth every 8 (eight) hours as needed for nausea or vomiting. 20 tablet 0  . traMADol (ULTRAM) 50 MG tablet TAKE ONE TABLET BY MOUTH EVERY TWELVE HOURS AS NEEDED. 60 tablet 5  . triamterene-hydrochlorothiazide (DYAZIDE) 37.5-25 MG capsule TAKE ONE CAPSULE BY MOUTH 3 TIMES PER WEEK 30 capsule 4  . diltiazem (CARDIZEM CD) 120 MG 24 hr capsule Take 1 capsule (120 mg total) by mouth daily. 90 capsule 3   No facility-administered medications prior to visit.      Allergies:   Iodine; Cymbalta [duloxetine hcl]; Gabapentin; Ivp dye [iodinated diagnostic agents]; and Zanaflex [tizanidine hcl]   Social  History   Social History  . Marital status: Married    Spouse name: N/A  . Number of children: 2  . Years of education: N/A   Occupational History  . Disabilty   . DISABILITY Unemployed   Social History Main Topics  . Smoking status: Former Smoker    Packs/day: 0.50    Years: 4.00    Types: Cigarettes    Start date: 12/07/1968    Quit date: 11/20/1972  . Smokeless tobacco: Never Used     Comment: quit 40 years ago  . Alcohol use No  . Drug use: No  . Sexual activity: No   Other Topics Concern  . None   Social History  Narrative  . None     Family History:  The patient's family history includes Bladder Cancer in her mother; Depression in her sister; Diabetes in her brother; Heart disease in her father and mother; Hyperlipidemia in her sister; Hypertension in her brother and mother; Seizures in her brother; Stroke in her father.   ROS:   Please see the history of present illness.    ROS All other systems reviewed and are negative.   PHYSICAL EXAM:   VS:  BP (!) 151/92   Pulse 74   Ht 5\' 3"  (1.6 m)   Wt 153 lb (69.4 kg)   BMI 27.10 kg/m    GEN: Well nourished, well developed, in no acute distress  HEENT: normal  Neck: no JVD, carotid bruits, or masses Cardiac: RRR; no murmurs, rubs, or gallops,no edema  Respiratory:  clear to auscultation bilaterally, normal work of breathing GI: soft, nontender, nondistended, + BS MS: no deformity or atrophy  Skin: warm and dry, no rash Neuro:  Alert and Oriented x 3, Strength and sensation are intact Psych: euthymic mood, full affect  Wt Readings from Last 3 Encounters:  02/02/16 153 lb (69.4 kg)  01/28/16 155 lb 12.8 oz (70.7 kg)  01/25/16 157 lb (71.2 kg)      Studies/Labs Reviewed:   EKG:  EKG is not ordered today.    Recent Labs: 06/26/2015: TSH 2.010 01/28/2016: ALT 12; BUN 10.0; Creatinine 0.8; HGB 11.5; Platelets 455 Platelet count confirmed by slide estimate; Potassium 4.2; Sodium 138   Lipid Panel      Component Value Date/Time   CHOL 213 (H) 06/26/2015 0821   CHOL 203 (H) 08/29/2012 1316   TRIG 73 06/26/2015 0821   TRIG 131 08/29/2012 1316   HDL 86 06/26/2015 0821   HDL 68 08/29/2012 1316   CHOLHDL 2.5 06/26/2015 0821   LDLCALC 112 (H) 06/26/2015 0821   LDLCALC 109 (H) 08/29/2012 1316    Additional studies/ records that were reviewed today include:    Echo 12/19/2013 LV EF: 55% -  60%  - Left ventricle: The cavity size was normal. Systolic function was normal. The estimated ejection fraction was in the range of 55% to 60%. Wall motion was normal; there were no regional wall motion abnormalities. Left ventricular diastolic function parameters were normal. - Atrial septum: No defect or patent foramen ovale was identified   Cath 08/28/2014 Conclusion    The left ventricular systolic function is normal.  Minimal CAD.   Continue preventive therapy.  F/u with Dr. Percival Black.      ASSESSMENT:    1. Coronary artery disease involving native coronary artery of native heart with angina pectoris with documented spasm ([place])   2. Essential hypertension      PLAN:  In order of problems listed above:  1. Coronary spasm: Verapamil was later switched to diltiazem, currently on 120 mg diltiazem CD. She has not had another episode of chest discomfort for the past 3 weeks. However did notice her blood pressure has been elevated and also she has been having some headache. She initially attributed to headache to diltiazem however later thought it may be related to sinus issues as it did improve after she received a course of Augmentin. Will continue on diltiazem for now, due to elevated blood pressure, I will increase her diltiazem to 240 mg daily. She will continue to observe her headache. If she is unable to tolerate diltiazem, we Black always consider nifedipine as alternative choice to control coronary spasm  however it also may cause some headache as well. 2. HTN: We will  increase her diltiazem to 240 mg daily.    Medication Adjustments/Labs and Tests Ordered: Current medicines are reviewed at length with the patient today.  Concerns regarding medicines are outlined above.  Medication changes, Labs and Tests ordered today are listed in the Patient Instructions below. Patient Instructions  Medication Instructions:  1. INCREASE DILTIAZEM CD TO 240 MG DAILY; YOU Black DOUBLE UP ON THE 120 MG TO FINISH THE 120 MG BOTTLE UP  Labwork: NONE  Testing/Procedures: NONE  Follow-Up: DR. Percival Black 2 MONTHS  Any Other Special Instructions Will Be Listed Below (If Applicable).     If you need a refill on your cardiac medications before your next appointment, please call your pharmacy.      Hilbert Corrigan, [place]  02/02/2016 10:46 PM    [facility] [address], [place], [place]  [address] Phone: [phone]; Fax: [phone]

## 2016-02-02 NOTE — Patient Instructions (Signed)
Medication Instructions:  1. INCREASE DILTIAZEM CD TO 240 MG DAILY; YOU CAN DOUBLE UP ON THE 120 MG TO FINISH THE 120 MG BOTTLE UP  Labwork: NONE  Testing/Procedures: NONE  Follow-Up: DR. Percival Spanish 2 MONTHS  Any Other Special Instructions Will Be Listed Below (If Applicable).     If you need a refill on your cardiac medications before your next appointment, please call your pharmacy.

## 2016-02-04 ENCOUNTER — Ambulatory Visit: Payer: Medicare Other | Admitting: Physician Assistant

## 2016-02-08 DIAGNOSIS — R202 Paresthesia of skin: Secondary | ICD-10-CM | POA: Diagnosis not present

## 2016-02-08 DIAGNOSIS — M79605 Pain in left leg: Secondary | ICD-10-CM | POA: Diagnosis not present

## 2016-02-08 DIAGNOSIS — R201 Hypoesthesia of skin: Secondary | ICD-10-CM | POA: Diagnosis not present

## 2016-02-08 DIAGNOSIS — M545 Low back pain: Secondary | ICD-10-CM | POA: Diagnosis not present

## 2016-02-17 DIAGNOSIS — G5791 Unspecified mononeuropathy of right lower limb: Secondary | ICD-10-CM | POA: Diagnosis not present

## 2016-02-18 DIAGNOSIS — H01111 Allergic dermatitis of right upper eyelid: Secondary | ICD-10-CM | POA: Diagnosis not present

## 2016-02-21 ENCOUNTER — Encounter (INDEPENDENT_AMBULATORY_CARE_PROVIDER_SITE_OTHER): Payer: Self-pay

## 2016-02-21 ENCOUNTER — Encounter: Payer: Self-pay | Admitting: Family Medicine

## 2016-02-21 ENCOUNTER — Ambulatory Visit (INDEPENDENT_AMBULATORY_CARE_PROVIDER_SITE_OTHER): Payer: Medicare Other | Admitting: Family Medicine

## 2016-02-21 ENCOUNTER — Other Ambulatory Visit: Payer: Self-pay | Admitting: Cardiovascular Disease

## 2016-02-21 VITALS — BP 160/82 | HR 69 | Temp 96.9°F | Ht 63.0 in | Wt 154.4 lb

## 2016-02-21 DIAGNOSIS — R6889 Other general symptoms and signs: Secondary | ICD-10-CM | POA: Diagnosis not present

## 2016-02-21 DIAGNOSIS — B029 Zoster without complications: Secondary | ICD-10-CM | POA: Diagnosis not present

## 2016-02-21 MED ORDER — VALACYCLOVIR HCL 1 G PO TABS: 1000.0000 mg | ORAL_TABLET | Freq: Two times a day (BID) | ORAL | 0 refills | Status: DC

## 2016-02-21 NOTE — Progress Notes (Signed)
BP (!) 154/77   Pulse 66   Temp (!) 96.9 F (36.1 C) (Oral)   Ht 5\' 3"  (1.6 m)   Wt 154 lb 6.4 oz (70 kg)   BMI 27.35 kg/m    Subjective:    Patient ID: Amy Black, female    DOB: 1940-11-26, 76 y.o.   MRN: [id number]  HPI: Amy Black is a 76 y.o. female presenting on 02/21/2016 for Rash (started last night, no change in facial cream, no itching, felt like twitch burn then she noticed)   HPI Rash Patient developed a rash that started last night with shooting pains going up on her nose on the left side of her nose and going up shooting towards her eye. She denies any fevers or chills or drainage. The lesions just started when she woke this morning. She says they are mildly irritating but not pruritic.  Relevant past medical, surgical, family and social history reviewed and updated as indicated. Interim medical history since our last visit reviewed. Allergies and medications reviewed and updated.  Review of Systems  Constitutional: Negative for chills and fever.  Respiratory: Negative for chest tightness and shortness of breath.   Cardiovascular: Negative for chest pain and leg swelling.  Genitourinary: Negative for difficulty urinating and dysuria.  Musculoskeletal: Negative for back pain and gait problem.  Skin: Negative for rash.  Neurological: Negative for light-headedness and headaches.  Psychiatric/Behavioral: Negative for agitation and behavioral problems.  All other systems reviewed and are negative.   Per HPI unless specifically indicated above      Objective:    BP (!) 154/77   Pulse 66   Temp (!) 96.9 F (36.1 C) (Oral)   Ht 5\' 3"  (1.6 m)   Wt 154 lb 6.4 oz (70 kg)   BMI 27.35 kg/m   Wt Readings from Last 3 Encounters:  02/21/16 154 lb 6.4 oz (70 kg)  02/02/16 153 lb (69.4 kg)  01/28/16 155 lb 12.8 oz (70.7 kg)    Physical Exam  Constitutional: She is oriented to person, place, and time. She appears well-developed and well-nourished. No  distress.  Eyes: Conjunctivae are normal.  Cardiovascular: Normal rate, regular rhythm, normal heart sounds and intact distal pulses.   No murmur heard. Pulmonary/Chest: Effort normal and breath sounds normal. No respiratory distress. She has no wheezes. She has no rales.  Musculoskeletal: Normal range of motion. She exhibits no edema or tenderness.  Neurological: She is alert and oriented to person, place, and time. Coordination normal.  Skin: Skin is warm and dry. Rash noted. Rash is vesicular (Patient has a vesicular rash with 3 small vesicles on the left side of her nose near the end.). She is not diaphoretic.  Psychiatric: She has a normal mood and affect. Her behavior is normal.  Nursing note and vitals reviewed.     Assessment & Plan:   Problem List Items Addressed This Visit    None    Visit Diagnoses    Herpes zoster without complication    -  Primary   Relevant Medications   valACYclovir (VALTREX) 1000 MG tablet   Other Relevant Orders   Ambulatory referral to Ophthalmology   Hutchinson's sign present       Relevant Medications   valACYclovir (VALTREX) 1000 MG tablet   Other Relevant Orders   Ambulatory referral to Ophthalmology      Follow up plan: Return if symptoms worsen or fail to improve.  Counseling provided for all of the vaccine  components Orders Placed This Encounter  Procedures  . Ambulatory referral to Ophthalmology    Caryl Pina, MD [facility] 02/21/2016, 5:33 PM

## 2016-02-25 DIAGNOSIS — H01111 Allergic dermatitis of right upper eyelid: Secondary | ICD-10-CM | POA: Diagnosis not present

## 2016-02-25 DIAGNOSIS — G5791 Unspecified mononeuropathy of right lower limb: Secondary | ICD-10-CM | POA: Diagnosis not present

## 2016-02-29 DIAGNOSIS — M7061 Trochanteric bursitis, right hip: Secondary | ICD-10-CM | POA: Diagnosis not present

## 2016-03-02 DIAGNOSIS — M7061 Trochanteric bursitis, right hip: Secondary | ICD-10-CM | POA: Diagnosis not present

## 2016-03-09 ENCOUNTER — Telehealth: Payer: Self-pay | Admitting: Pharmacist

## 2016-03-13 NOTE — Progress Notes (Signed)
Cardiology Office Note   Date:  03/15/2016   ID:  Amy Black, DOB Mar 17, 1940, MRN [id number]  PCP:  Amy Fraise, MD  Cardiologist:   Minus Breeding, MD    Chief Complaint  Patient presents with  . Palpitations     History of Present Illness: Amy Black is a 76 y.o. female who presents for a follow up of presumed coronary spasm. On 08/28/2014, she had left heart cath that showed minimal CAD, 20% disease in LAD was normal LVEF. He also had history of palpitations and hypertension. She has been on verapamil for greater than 25 years.  She has had recurrent chest pain and has been treated with increased doses of Imdur.  She had relief of her pain after being taken off of verapamil and started on Cardizem.  Since she was seen she has done well.  The patient denies any new symptoms such as chest discomfort, neck or arm discomfort. There has been no new shortness of breath, PND or orthopnea. There have been no reported palpitations, presyncope or syncope.  She is doing PT for her hip and walks with a cane.    Past Medical History:  Diagnosis Date  . Adenocarcinoma (HCC)    LEFT LEG  . Adenocarcinoma of unknown primary (Amy Black) 02/06/2011  . 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 Allergy   . Anxiety   . CAD (coronary artery disease)   . Cataract    bilateral cataracts removed  . Coronary artery spasm ([place])   . Diverticulosis of colon (without mention of hemorrhage)   . GERD (gastroesophageal reflux disease)   . hepatic cyst   . Hip fracture, right ([place])   . History of cardiac catheterization   . History of nuclear stress test 04/2011   lexiscan; normal study, no significant ischemia, low risk   . HTN (hypertension)    PT. DENIES  . Hyperlipidemia   . Insomnia   . Irritable bowel syndrome   . Transverse myelitis ([place])   . Unstable angina ([place])   . Vitamin D deficiency     Past Surgical History:  Procedure Laterality Date  . ABDOMINAL  HYSTERECTOMY Bilateral   . CARDIAC CATHETERIZATION     coronary spasm  . CARDIAC CATHETERIZATION N/A 08/28/2014   Procedure: Left Heart Cath and Coronary Angiography;  Surgeon: Jettie Booze, MD;  Location: [facility];  Service: Cardiovascular;  Laterality: N/A;  . COLONOSCOPY    . DIAGNOSTIC LAPAROSCOPY    . LYSIS OF ADHESION    . PELVIC LAPAROSCOPY  1992   RSO, AND LSO ON 2007  . RESECTION SOFT TISSUE TUMOR LEG / ANKLE RADICAL  2004   leg lesion resection & radiation  . SALPINGOOPHORECTOMY Left   . TOTAL HIP ARTHROPLASTY Right 10/04/2015   Procedure: TOTAL HIP ARTHROPLASTY ANTERIOR APPROACH;  Surgeon: Rod Can, MD;  Location: [facility];  Service: Orthopedics;  Laterality: Right;  . TRANSTHORACIC ECHOCARDIOGRAM  07/2012   LV cavity size mildly reduced, normal wall motion; MV with calcified annulus and mild MR; LA mildly dilated; atrial septum with increased thickness - lipomatous hypertrophy; RV systolic pressure increased (borderline pulm HTN)  . UPPER GASTROINTESTINAL ENDOSCOPY       Current Outpatient Prescriptions  Medication Sig Dispense Refill  . ALPRAZolam (XANAX) 0.5 MG tablet Take 1 tablet (0.5 mg total) by mouth 3 (three) times daily. 90 tablet 5  . aspirin 81 MG tablet Take 1 tablet (  81 mg total) by mouth 2 (two) times daily with a meal. 30 tablet   . Calcium Citrate-Vitamin D (CALCIUM CITRATE + D3) 315-250 MG-UNIT TABS Take 2 tablets by mouth daily. 120 tablet   . cetirizine (ZYRTEC) 10 MG tablet Take 1 tablet (10 mg total) by mouth 2 (two) times daily. 30 tablet 0  . diltiazem (CARDIZEM CD) 240 MG 24 hr capsule Take 1 capsule (240 mg total) by mouth daily. 90 capsule 3  . ferrous sulfate 325 (65 FE) MG tablet TAKE ONE TABLET BY MOUTH DAILY WITH BREAKFAST 30 tablet 5  . fluticasone (FLONASE) 50 MCG/ACT nasal spray Place 2 sprays into both nostrils daily. 16 g 6  . isosorbide mononitrate (IMDUR) 30 MG 24 hr tablet Take 1 tablet (30 mg total) by mouth daily. 30  tablet 6  . loperamide (IMODIUM) 2 MG capsule TAKE ONE CAPSULE BY MOUTH FOUR TIMES DAILY AS NEEDED FOR DIARRHEA / LOOSE STOOLS 120 capsule 3  . NITROSTAT 0.4 MG SL tablet DISSOLVE ONE TABLET UNDER TONGUE EVERY 5 MINUTES UP TO 3 DOSES AS NEEDED FOR CHEST PAIN 25 tablet 1  . pantoprazole (PROTONIX) 40 MG tablet TAKE ONE TABLET BY MOUTH TWICE DAILY. KEEP APPOINTMENT. 180 tablet 0  . promethazine (PHENERGAN) 12.5 MG tablet Take 1 tablet (12.5 mg total) by mouth every 8 (eight) hours as needed for nausea or vomiting. 20 tablet 0  . traMADol (ULTRAM) 50 MG tablet TAKE ONE TABLET BY MOUTH EVERY TWELVE HOURS AS NEEDED. 60 tablet 5  . triamterene-hydrochlorothiazide (DYAZIDE) 37.5-25 MG capsule TAKE ONE CAPSULE BY MOUTH 3 TIMES PER WEEK 30 capsule 4   No current facility-administered medications for this visit.     Allergies:   Iodine; Cymbalta [duloxetine hcl]; Gabapentin; Ivp dye [iodinated diagnostic agents]; and Zanaflex [tizanidine hcl]    ROS:  Please see the history of present illness.   Otherwise, review of systems are positive for none.   All other systems are reviewed and negative.    PHYSICAL EXAM: VS:  BP 122/70   Pulse 60   Ht 5\' 3"  (1.6 m)   Wt 154 lb (69.9 kg)   BMI 27.28 kg/m  , BMI Body mass index is 27.28 kg/m. GENERAL:  Well appearing NECK:  No jugular venous distention, waveform within normal limits, carotid upstroke brisk and symmetric, no bruits, no thyromegaly LUNGS:  Clear to auscultation bilaterally BACK:  No CVA tenderness CHEST:  Unremarkable HEART:  PMI not displaced or sustained,S1 and S2 within normal limits, no S3, no S4, no clicks, no rubs, 2/6 apical systolic early peaking murmur at the apex without diastolic murmurs ABD:  Flat, positive bowel sounds normal in frequency in pitch, no bruits, no rebound, no guarding, no midline pulsatile mass, no hepatomegaly, no splenomegaly EXT:  2 plus pulses throughout, no edema, no cyanosis no clubbing   EKG:  EKG is not  ordered today.   Recent Labs: 06/26/2015: TSH 2.010 01/28/2016: ALT 12; BUN 10.0; Creatinine 0.8; HGB 11.5; Platelets 455 Platelet count confirmed by slide estimate; Potassium 4.2; Sodium 138    Lipid Panel    Component Value Date/Time   CHOL 213 (H) 06/26/2015 0821   CHOL 203 (H) 08/29/2012 1316   TRIG 73 06/26/2015 0821   TRIG 131 08/29/2012 1316   HDL 86 06/26/2015 0821   HDL 68 08/29/2012 1316   CHOLHDL 2.5 06/26/2015 0821   LDLCALC 112 (H) 06/26/2015 0821   LDLCALC 109 (H) 08/29/2012 1316      Wt  Readings from Last 3 Encounters:  03/15/16 154 lb (69.9 kg)  02/21/16 154 lb 6.4 oz (70 kg)  02/02/16 153 lb (69.4 kg)      Other studies Reviewed: Additional studies/ records that were reviewed today include: None. Review of the above records demonstrates:     ASSESSMENT AND PLAN:  Coronary Spasm:  She has had no symptoms related to this. No change in therapy is indicated.    HTN:  Her BP is well controlled.  She will continue on the meds as listed.  Palpitations:  She is not bothered by these any longer.  No change in therapy is indicated.    Current medicines are reviewed at length with the patient today.  The patient does not have concerns regarding medicines.  The following changes have been made:  no change  Labs/ tests ordered today include: None No orders of the defined types were placed in this encounter.    Disposition:   FU with 12 months.      Signed, Minus Breeding, MD  03/15/2016 7:16 PM    [facility]

## 2016-03-14 DIAGNOSIS — M7061 Trochanteric bursitis, right hip: Secondary | ICD-10-CM | POA: Diagnosis not present

## 2016-03-15 ENCOUNTER — Encounter: Payer: Self-pay | Admitting: Cardiology

## 2016-03-15 ENCOUNTER — Ambulatory Visit (INDEPENDENT_AMBULATORY_CARE_PROVIDER_SITE_OTHER): Payer: Medicare Other | Admitting: Cardiology

## 2016-03-15 VITALS — BP 122/70 | HR 60 | Ht 63.0 in | Wt 154.0 lb

## 2016-03-15 DIAGNOSIS — I25111 Atherosclerotic heart disease of native coronary artery with angina pectoris with documented spasm: Secondary | ICD-10-CM

## 2016-03-15 DIAGNOSIS — R002 Palpitations: Secondary | ICD-10-CM

## 2016-03-15 NOTE — Patient Instructions (Signed)

## 2016-03-20 DIAGNOSIS — M7061 Trochanteric bursitis, right hip: Secondary | ICD-10-CM | POA: Diagnosis not present

## 2016-03-27 DIAGNOSIS — M7061 Trochanteric bursitis, right hip: Secondary | ICD-10-CM | POA: Diagnosis not present

## 2016-03-30 DIAGNOSIS — M7061 Trochanteric bursitis, right hip: Secondary | ICD-10-CM | POA: Diagnosis not present

## 2016-04-03 DIAGNOSIS — G5791 Unspecified mononeuropathy of right lower limb: Secondary | ICD-10-CM | POA: Diagnosis not present

## 2016-04-05 ENCOUNTER — Encounter: Payer: Self-pay | Admitting: *Deleted

## 2016-04-07 ENCOUNTER — Encounter: Payer: Self-pay | Admitting: Family Medicine

## 2016-04-07 ENCOUNTER — Ambulatory Visit (INDEPENDENT_AMBULATORY_CARE_PROVIDER_SITE_OTHER): Payer: Medicare Other | Admitting: Family Medicine

## 2016-04-07 VITALS — BP 137/76 | HR 69 | Temp 97.4°F | Ht 63.0 in | Wt 149.5 lb

## 2016-04-07 DIAGNOSIS — M1711 Unilateral primary osteoarthritis, right knee: Secondary | ICD-10-CM

## 2016-04-07 MED ORDER — METHYLPREDNISOLONE ACETATE 80 MG/ML IJ SUSP
80.0000 mg | Freq: Once | INTRAMUSCULAR | Status: AC
  Administered 2016-04-07: 80 mg via INTRAMUSCULAR

## 2016-04-07 NOTE — Progress Notes (Signed)
BP 137/76   Pulse 69   Temp 97.4 F (36.3 C) (Oral)   Ht 5\' 3"  (1.6 m)   Wt 149 lb 8 oz (67.8 kg)   BMI 26.48 kg/m    Subjective:    Patient ID: Amy Black, female    DOB: 21-Nov-1940, 76 y.o.   MRN: [id number]  HPI: Amy Black is a 76 y.o. female presenting on 04/07/2016 for Pain behind right knee   HPI Right knee pain and swelling Patient has been having pain in her right knee and swelling behind her right knee that's been bothering her over the past 2 or 3 months. She says she has been trying to go back into rehabilitation and exercise regiment and with this she is getting a lot more pain and swelling in that right knee and especially behind the knee which hurts her at night. Sometimes it does keep her up in the evenings. She denies any redness or swelling or warmth. She does sometimes get pain on the anterior medial aspect of the knee as well. She denies any giving way or popping or catching.  Relevant past medical, surgical, family and social history reviewed and updated as indicated. Interim medical history since our last visit reviewed. Allergies and medications reviewed and updated.  Review of Systems  Constitutional: Negative for chills and fever.  Respiratory: Negative for chest tightness and shortness of breath.   Cardiovascular: Negative for chest pain and leg swelling.  Musculoskeletal: Positive for arthralgias and joint swelling. Negative for back pain and gait problem.  Skin: Negative for color change and rash.  All other systems reviewed and are negative.   Per HPI unless specifically indicated above      Objective:    BP 137/76   Pulse 69   Temp 97.4 F (36.3 C) (Oral)   Ht 5\' 3"  (1.6 m)   Wt 149 lb 8 oz (67.8 kg)   BMI 26.48 kg/m   Wt Readings from Last 3 Encounters:  04/07/16 149 lb 8 oz (67.8 kg)  03/15/16 154 lb (69.9 kg)  02/21/16 154 lb 6.4 oz (70 kg)    Physical Exam  Constitutional: She is oriented to person, place, and time. She  appears well-developed and well-nourished. No distress.  Eyes: Conjunctivae are normal.  Musculoskeletal: Normal range of motion. She exhibits no edema.       Right knee: She exhibits swelling and effusion. She exhibits normal range of motion, normal alignment, no LCL laxity, normal patellar mobility, normal meniscus and no MCL laxity. Tenderness found. Medial joint line (And posterior, Baker's cyst) tenderness noted.  Neurological: She is alert and oriented to person, place, and time. Coordination normal.  Skin: Skin is warm and dry. No rash noted. She is not diaphoretic.  Psychiatric: She has a normal mood and affect. Her behavior is normal.  Nursing note and vitals reviewed.   Knee injection: Risk factors of bleeding and infection discussed with patient and patient is agreeable towards injection. Patient prepped with Betadine. Lateral approach towards injection used. Injected 80 mg of Depo-Medrol and 1 mL of 2% lidocaine. Patient tolerated procedure well and no side effects from noted. Minimal to no bleeding. Simple bandage applied after.     Assessment & Plan:   Problem List Items Addressed This Visit    None    Visit Diagnoses    Primary osteoarthritis of right knee    -  Primary   Relevant Medications   methylPREDNISolone acetate (DEPO-MEDROL) injection 80  mg (Completed)       Follow up plan: Return if symptoms worsen or fail to improve.  Counseling provided for all of the vaccine components No orders of the defined types were placed in this encounter.   Caryl Pina, MD [facility] 04/07/2016, 2:57 PM

## 2016-04-17 ENCOUNTER — Encounter: Payer: Medicare Other | Admitting: *Deleted

## 2016-04-20 ENCOUNTER — Ambulatory Visit (INDEPENDENT_AMBULATORY_CARE_PROVIDER_SITE_OTHER): Payer: Medicare Other | Admitting: Family Medicine

## 2016-04-20 ENCOUNTER — Encounter: Payer: Self-pay | Admitting: Family Medicine

## 2016-04-20 VITALS — BP 123/74 | HR 67 | Temp 96.7°F | Ht 63.0 in | Wt 151.2 lb

## 2016-04-20 DIAGNOSIS — I10 Essential (primary) hypertension: Secondary | ICD-10-CM | POA: Diagnosis not present

## 2016-04-20 DIAGNOSIS — D508 Other iron deficiency anemias: Secondary | ICD-10-CM | POA: Diagnosis not present

## 2016-04-20 DIAGNOSIS — Z1239 Encounter for other screening for malignant neoplasm of breast: Secondary | ICD-10-CM

## 2016-04-20 DIAGNOSIS — Z1231 Encounter for screening mammogram for malignant neoplasm of breast: Secondary | ICD-10-CM | POA: Diagnosis not present

## 2016-04-20 DIAGNOSIS — K219 Gastro-esophageal reflux disease without esophagitis: Secondary | ICD-10-CM | POA: Diagnosis not present

## 2016-04-20 DIAGNOSIS — R252 Cramp and spasm: Secondary | ICD-10-CM | POA: Diagnosis not present

## 2016-04-20 MED ORDER — BACLOFEN 10 MG PO TABS: 10.0000 mg | ORAL_TABLET | Freq: Three times a day (TID) | ORAL | 0 refills | Status: DC

## 2016-04-20 MED ORDER — DILTIAZEM HCL ER COATED BEADS 240 MG PO CP24: 240.0000 mg | ORAL_CAPSULE | Freq: Every day | ORAL | 2 refills | Status: DC

## 2016-04-20 MED ORDER — ALPRAZOLAM 0.5 MG PO TABS: 0.5000 mg | ORAL_TABLET | Freq: Three times a day (TID) | ORAL | 5 refills | Status: DC

## 2016-04-20 NOTE — Progress Notes (Signed)
BP 123/74   Pulse 67   Temp (!) 96.7 F (35.9 C) (Oral)   Ht '5\' 3"'$  (1.6 m)   Wt 151 lb 4 oz (68.6 kg)   BMI 26.79 kg/m    Subjective:    Patient ID: Amy Black, female    DOB: [birth year], 76 y.o.   MRN: [id number]  HPI: Amy Black is a 76 y.o. female presenting on 04/20/2016 for Annual Exam (patient has not eaten since 9:30 am) and Right foot pain (hurts when she lays down)   HPI Hypertension Patient is coming in for hypertension recheck. She is currently on Cardizem and Dyazide. Her blood pressure today is 123/74. She denies any lightheadedness or dizziness. Patient denies headaches, blurred vision, chest pains, shortness of breath, or weakness. Denies any side effects from medication and is content with current medication.   GERD Patient takes Protonix and has been taking for quite some time and denies any abdominal pain or acid reflux or heartburn. She is doing pretty well. She denies any blood in her stool.  Anemia Patient has been taking iron for her anemia and has been improving, she is due for recheck today. She denies any lightheadedness or dizziness or fatigue and has been doing really well on this overall.  Right leg cramping Patient has right leg cramping and foot cramping that is probable some at night. She denies any major issues during the day. She denies any fevers or chills or skin changes. She has tried meloxicam for it before and it does not help and would like to try a muscle relaxer.  Relevant past medical, surgical, family and social history reviewed and updated as indicated. Interim medical history since our last visit reviewed. Allergies and medications reviewed and updated.  Review of Systems  Constitutional: Negative for chills and fever.  HENT: Negative for congestion, ear discharge and ear pain.   Eyes: Negative for redness and visual disturbance.  Respiratory: Negative for chest tightness and shortness of breath.   Cardiovascular: Negative  for chest pain and leg swelling.  Genitourinary: Negative for difficulty urinating and dysuria.  Musculoskeletal: Positive for myalgias. Negative for back pain and gait problem.  Skin: Negative for color change and rash.  Neurological: Negative for dizziness, light-headedness and headaches.  Psychiatric/Behavioral: Negative for agitation and behavioral problems.  All other systems reviewed and are negative.   Per HPI unless specifically indicated above      Objective:    BP 123/74   Pulse 67   Temp (!) 96.7 F (35.9 C) (Oral)   Ht '5\' 3"'$  (1.6 m)   Wt 151 lb 4 oz (68.6 kg)   BMI 26.79 kg/m   Wt Readings from Last 3 Encounters:  04/20/16 151 lb 4 oz (68.6 kg)  04/07/16 149 lb 8 oz (67.8 kg)  03/15/16 154 lb (69.9 kg)    Physical Exam  Constitutional: She is oriented to person, place, and time. She appears well-developed and well-nourished. No distress.  Eyes: Conjunctivae are normal.  Neck: Neck supple.  Cardiovascular: Normal rate, regular rhythm, normal heart sounds and intact distal pulses.   No murmur heard. Pulmonary/Chest: Effort normal and breath sounds normal. No respiratory distress. She has no wheezes. She has no rales.  Musculoskeletal: Normal range of motion. She exhibits no edema or tenderness (no pain in right foot on exam).  Lymphadenopathy:    She has no cervical adenopathy.  Neurological: She is alert and oriented to person, place, and time. Coordination normal.  Skin: Skin is warm and dry. No rash noted. She is not diaphoretic.  Psychiatric: She has a normal mood and affect. Her behavior is normal.  Nursing note and vitals reviewed.     Assessment & Plan:   Problem List Items Addressed This Visit      Cardiovascular and Mediastinum   HTN (hypertension) - Primary   Relevant Medications   diltiazem (CARDIZEM CD) 240 MG 24 hr capsule   Other Relevant Orders   CMP14+EGFR   Lipid panel   MM Digital Screening     Digestive   GERD   Relevant Orders     CBC with Differential/Platelet    Other Visit Diagnoses    Breast cancer screening       Iron deficiency anemia secondary to inadequate dietary iron intake       Relevant Orders   CBC with Differential/Platelet      Follow up plan: Return in about 3 months (around 07/21/2016), or if symptoms worsen or fail to improve, for Recheck hypertension and anxiety.  Counseling provided for all of the vaccine components Orders Placed This Encounter  Procedures  . MM Digital Screening  . CMP14+EGFR  . Lipid panel  . CBC with Differential/Platelet    Caryl Pina, MD [facility] 04/20/2016, 2:24 PM

## 2016-04-21 DIAGNOSIS — Z0289 Encounter for other administrative examinations: Secondary | ICD-10-CM

## 2016-04-21 LAB — CMP14+EGFR
ALT: 11 IU/L (ref 0–32)
AST: 12 IU/L (ref 0–40)
Albumin/Globulin Ratio: 1.6 (ref 1.2–2.2)
Albumin: 4.5 g/dL (ref 3.5–4.8)
Alkaline Phosphatase: 119 IU/L — ABNORMAL HIGH (ref 39–117)
BUN/Creatinine Ratio: 13 (ref 12–28)
BUN: 11 mg/dL (ref 8–27)
Bilirubin Total: 0.3 mg/dL (ref 0.0–1.2)
CO2: 25 mmol/L (ref 18–29)
Calcium: 9.9 mg/dL (ref 8.7–10.3)
Chloride: 96 mmol/L (ref 96–106)
Creatinine, Ser: 0.86 mg/dL (ref 0.57–1.00)
GFR calc Af Amer: 78 mL/min/{1.73_m2} (ref >59)
GFR calc non Af Amer: 67 mL/min/{1.73_m2} (ref >59)
Globulin, Total: 2.8 g/dL (ref 1.5–4.5)
Glucose: 88 mg/dL (ref 65–99)
Potassium: 4.7 mmol/L (ref 3.5–5.2)
Sodium: 140 mmol/L (ref 134–144)
Total Protein: 7.3 g/dL (ref 6.0–8.5)

## 2016-04-21 LAB — LIPID PANEL
Chol/HDL Ratio: 2.4 ratio units (ref 0.0–4.4)
Cholesterol, Total: 213 mg/dL — ABNORMAL HIGH (ref 100–199)
HDL: 89 mg/dL (ref >39)
LDL Calculated: 106 mg/dL — ABNORMAL HIGH (ref 0–99)
Triglycerides: 92 mg/dL (ref 0–149)
VLDL Cholesterol Cal: 18 mg/dL (ref 5–40)

## 2016-04-21 LAB — CBC WITH DIFFERENTIAL/PLATELET
Basophils Absolute: 0.1 10*3/uL (ref 0.0–0.2)
Basos: 1 %
EOS (ABSOLUTE): 0.1 10*3/uL (ref 0.0–0.4)
Eos: 2 %
Hematocrit: 37.4 % (ref 34.0–46.6)
Hemoglobin: 12.1 g/dL (ref 11.1–15.9)
Immature Grans (Abs): 0 10*3/uL (ref 0.0–0.1)
Immature Granulocytes: 0 %
Lymphocytes Absolute: 2.7 10*3/uL (ref 0.7–3.1)
Lymphs: 40 %
MCH: 30.9 pg (ref 26.6–33.0)
MCHC: 32.4 g/dL (ref 31.5–35.7)
MCV: 96 fL (ref 79–97)
Monocytes Absolute: 0.5 10*3/uL (ref 0.1–0.9)
Monocytes: 7 %
Neutrophils Absolute: 3.3 10*3/uL (ref 1.4–7.0)
Neutrophils: 50 %
Platelets: 456 10*3/uL — ABNORMAL HIGH (ref 150–379)
RBC: 3.91 x10E6/uL (ref 3.77–5.28)
RDW: 14.2 % (ref 12.3–15.4)
WBC: 6.7 10*3/uL (ref 3.4–10.8)

## 2016-05-04 ENCOUNTER — Other Ambulatory Visit: Payer: Self-pay | Admitting: Family

## 2016-05-19 DIAGNOSIS — H35373 Puckering of macula, bilateral: Secondary | ICD-10-CM | POA: Diagnosis not present

## 2016-05-24 DIAGNOSIS — S72091D Other fracture of head and neck of right femur, subsequent encounter for closed fracture with routine healing: Secondary | ICD-10-CM | POA: Diagnosis not present

## 2016-05-25 ENCOUNTER — Ambulatory Visit (INDEPENDENT_AMBULATORY_CARE_PROVIDER_SITE_OTHER): Payer: Medicare Other

## 2016-05-25 ENCOUNTER — Ambulatory Visit (INDEPENDENT_AMBULATORY_CARE_PROVIDER_SITE_OTHER): Payer: Medicare Other | Admitting: Family Medicine

## 2016-05-25 VITALS — BP 104/67 | HR 72 | Temp 97.1°F | Ht 63.0 in | Wt 159.0 lb

## 2016-05-25 DIAGNOSIS — M5431 Sciatica, right side: Secondary | ICD-10-CM

## 2016-05-25 DIAGNOSIS — M2061 Acquired deformities of toe(s), unspecified, right foot: Secondary | ICD-10-CM

## 2016-05-25 MED ORDER — PREDNISONE 10 MG PO TABS: ORAL_TABLET | ORAL | 0 refills | Status: DC

## 2016-05-25 NOTE — Progress Notes (Signed)
Subjective:  Patient ID: Amy Black, female    DOB: 1940/03/17  Age: 76 y.o. MRN: [id number]  CC: Leg Pain (pt here today cx/o right leg pain and she was seen by her hip doctor yesterday as she thought it was due to her hip. She was told by the hip doctor that it was her back and not her hip. Her right big toe is also causing her pain.)   HPI [name] presents forLeg pain. Hip doctor told her that her pain was coming from her back. She states that the pain goes down the lateral thigh past the hip into the posterior leg and she has numbness and pain in her right big toe. Onset 1-2 weeks ago with increase noted. Pain is a throbbing with lancinating character at times. It diminishes her ability to walk properly. Also she is having pain in the right first toe stating that she cannot plantar flex. Therefore it stands up straight and gets injured frequently.   History Jerilynn has a past medical history of Adenocarcinoma ([place]); Adenocarcinoma of unknown primary ([place]) (02/06/2011); Adenosquamous carcinoma; Allergy; Anxiety; CAD (coronary artery disease); Cataract; Coronary artery spasm ([place]); Diverticulosis of colon (without mention of hemorrhage); GERD (gastroesophageal reflux disease); hepatic cyst; Hip fracture, right ([place]); History of cardiac catheterization; History of nuclear stress test (04/2011); HTN (hypertension); Hyperlipidemia; Insomnia; Irritable bowel syndrome; Transverse myelitis ([place]); Unstable angina (HCC); and Vitamin D deficiency.   She has a past surgical history that includes Resection soft tissue tumor leg / ankle radical (2004); Diagnostic laparoscopy; Lysis of adhesion; Salpingoophorectomy (Left); Pelvic laparoscopy (1992); Cardiac catheterization; transthoracic echocardiogram (07/2012); Cardiac catheterization (N/A, 08/28/2014); Colonoscopy; Upper gastrointestinal endoscopy; Total hip arthroplasty (Right, 10/04/2015); and Abdominal hysterectomy (Bilateral).   Her family  history includes Bladder Cancer in her mother; Depression in her sister; Diabetes in her brother; Heart disease in her father and mother; Hyperlipidemia in her sister; Hypertension in her brother and mother; Seizures in her brother; Stroke in her father.She reports that she quit smoking about 43 years ago. Her smoking use included Cigarettes. She started smoking about 47 years ago. She has a 2.00 pack-year smoking history. She has never used smokeless tobacco. She reports that she does not drink alcohol or use drugs.    ROS Review of Systems  Constitutional: Negative for activity change, appetite change and fever.  HENT: Negative for congestion, rhinorrhea and sore throat.   Eyes: Negative for visual disturbance.  Respiratory: Negative for cough and shortness of breath.   Cardiovascular: Negative for chest pain and palpitations.  Gastrointestinal: Negative for abdominal pain, diarrhea and nausea.  Genitourinary: Negative for dysuria.  Musculoskeletal: Negative for arthralgias and myalgias.    Objective:  BP 104/67   Pulse 72   Temp 97.1 F (36.2 C) (Oral)   Ht 5\' 3"  (1.6 m)   Wt 159 lb (72.1 kg)   BMI 28.17 kg/m   BP Readings from Last 3 Encounters:  05/25/16 104/67  04/20/16 123/74  04/07/16 137/76    Wt Readings from Last 3 Encounters:  05/25/16 159 lb (72.1 kg)  04/20/16 151 lb 4 oz (68.6 kg)  04/07/16 149 lb 8 oz (67.8 kg)     Physical Exam  Constitutional: She is oriented to person, place, and time. She appears well-developed and well-nourished. No distress.  HENT:  Head: Normocephalic and atraumatic.  Eyes: Conjunctivae are normal. Pupils are equal, round, and reactive to light.  Neck: Normal range of motion. Neck supple. No thyromegaly present.  Cardiovascular: Normal rate, regular rhythm and normal heart sounds.   No murmur heard. Pulmonary/Chest: Effort normal and breath sounds normal. No respiratory distress. She has no wheezes. She has no rales.  Abdominal:  Soft. Bowel sounds are normal. She exhibits no distension. There is no tenderness.  Musculoskeletal: Normal range of motion.  There is tenderness at the left sciatic notch. There is a positive straight leg raise. The deep tendon reflexes are intact for the left lower extremity. The left first toe is dorsiflexed. It can be easily passively plantar flexed but once pressures removed it goes back to the dorsiflexed position.  Lymphadenopathy:    She has no cervical adenopathy.  Neurological: She is alert and oriented to person, place, and time.  Skin: Skin is warm and dry.  Psychiatric: She has a normal mood and affect. Her behavior is normal. Judgment and thought content normal.      Assessment & Plan:   Kemiyah was seen today for leg pain.  Diagnoses and all orders for this visit:  Sciatica of right side -     DG Lumbar Spine 2-3 Views; Future  Toe deformity, acquired, right -     Ambulatory referral to Podiatry  Other orders -     predniSONE (DELTASONE) 10 MG tablet; Take 5 daily for 3 days followed by 4,3,2 and 1 for 3 days each.       I am having Ms. Buchberger start on predniSONE. I am also having her maintain her cetirizine, fluticasone, NITROSTAT, loperamide, aspirin, triamterene-hydrochlorothiazide, Calcium Citrate-Vitamin D, promethazine, traMADol, ferrous sulfate, pantoprazole, ALPRAZolam, diltiazem, and baclofen.  Allergies as of 05/25/2016      Reactions   Iodine Itching   Cymbalta [duloxetine Hcl] Other (See Comments)   sleepiness   Gabapentin Other (See Comments)   sleepiness   Ivp Dye [iodinated Diagnostic Agents] Itching   Zanaflex [tizanidine Hcl] Other (See Comments)   Very drunk feeling next day      Medication List       Accurate as of 05/25/16 11:59 PM. Always use your most recent med list.          ALPRAZolam 0.5 MG tablet Commonly known as:  XANAX Take 1 tablet (0.5 mg total) by mouth 3 (three) times daily.   aspirin 81 MG tablet Take 1 tablet  (81 mg total) by mouth 2 (two) times daily with a meal.   baclofen 10 MG tablet Commonly known as:  LIORESAL Take 1 tablet (10 mg total) by mouth 3 (three) times daily.   Calcium Citrate-Vitamin D 315-250 MG-UNIT Tabs Commonly known as:  CALCIUM CITRATE + D3 Take 2 tablets by mouth daily.   cetirizine 10 MG tablet Commonly known as:  ZYRTEC Take 1 tablet (10 mg total) by mouth 2 (two) times daily.   diltiazem 240 MG 24 hr capsule Commonly known as:  CARDIZEM CD Take 1 capsule (240 mg total) by mouth daily.   ferrous sulfate 325 (65 FE) MG tablet TAKE ONE TABLET BY MOUTH DAILY WITH BREAKFAST   fluticasone 50 MCG/ACT nasal spray Commonly known as:  FLONASE Place 2 sprays into both nostrils daily.   loperamide 2 MG capsule Commonly known as:  IMODIUM TAKE ONE CAPSULE BY MOUTH FOUR TIMES DAILY AS NEEDED FOR DIARRHEA / LOOSE STOOLS   NITROSTAT 0.4 MG SL tablet Generic drug:  nitroGLYCERIN DISSOLVE ONE TABLET UNDER TONGUE EVERY 5 MINUTES UP TO 3 DOSES AS NEEDED FOR CHEST PAIN   pantoprazole 40 MG tablet Commonly known as:  PROTONIX TAKE ONE TABLET BY MOUTH TWICE DAILY. KEEP APPOINTMENT.   predniSONE 10 MG tablet Commonly known as:  DELTASONE Take 5 daily for 3 days followed by 4,3,2 and 1 for 3 days each.   promethazine 12.5 MG tablet Commonly known as:  PHENERGAN Take 1 tablet (12.5 mg total) by mouth every 8 (eight) hours as needed for nausea or vomiting.   traMADol 50 MG tablet Commonly known as:  ULTRAM TAKE ONE TABLET BY MOUTH EVERY TWELVE HOURS AS NEEDED.   triamterene-hydrochlorothiazide 37.5-25 MG capsule Commonly known as:  DYAZIDE TAKE ONE CAPSULE BY MOUTH 3 TIMES PER WEEK        Follow-up: No Follow-up on file.  [name], M.D.

## 2016-05-30 ENCOUNTER — Other Ambulatory Visit: Payer: Self-pay | Admitting: Family Medicine

## 2016-05-30 DIAGNOSIS — R252 Cramp and spasm: Secondary | ICD-10-CM

## 2016-06-06 ENCOUNTER — Ambulatory Visit: Payer: Medicare Other | Admitting: Family Medicine

## 2016-06-06 ENCOUNTER — Other Ambulatory Visit: Payer: Self-pay | Admitting: Internal Medicine

## 2016-06-06 ENCOUNTER — Encounter: Payer: Self-pay | Admitting: Family Medicine

## 2016-06-06 ENCOUNTER — Ambulatory Visit (INDEPENDENT_AMBULATORY_CARE_PROVIDER_SITE_OTHER): Payer: Medicare Other | Admitting: Family Medicine

## 2016-06-06 VITALS — BP 127/77 | HR 74 | Temp 98.1°F | Ht 63.0 in | Wt 158.0 lb

## 2016-06-06 DIAGNOSIS — R079 Chest pain, unspecified: Secondary | ICD-10-CM

## 2016-06-06 DIAGNOSIS — R252 Cramp and spasm: Secondary | ICD-10-CM

## 2016-06-06 NOTE — Telephone Encounter (Signed)
REFILL 

## 2016-06-06 NOTE — Progress Notes (Signed)
BP 127/77   Pulse 74   Temp 98.1 F (36.7 C) (Oral)   Ht 5\' 3"  (1.6 m)   Wt 158 lb (71.7 kg)   BMI 27.99 kg/m    Subjective:    Patient ID: Amy Black, female    DOB: 1940-08-20, 76 y.o.   MRN: [id number]  HPI: Amy Black is a 76 y.o. female presenting on 06/06/2016 for Chest Pain (began Saturday night, took Nitroglycerin & Imdur and pain eased; had another episode last night/this morning, took another Nitroglycerin and pain eased) and Leg Pain (right leg)   HPI Chest pain The patient comes in complaining of chest pain that is in the substernal region that began Saturday night which is 2 days ago and she took a nitroglycerin and her Imdur and the pain eased and then came back last night early this morning. It awoke early this morning around 3 or 4 AM and she was feeling like she had a pressure and tightness in her chest. She denies any shortness of breath or sweats or fevers or cough or congestion and runny nose along with this. She took another nitroglycerin last night and the pain eased. She does have a cardiologist that she has seen but has not had any testing with it recently.  Leg pain and cramping Patient gets some intermittent leg pain and cramping that occurs at night and she feels like she has to get up and move around and then it will go away. She has not tried anything for this just yet. She says she does not experience this at all during the day and has been having this issue for at least a few months intermittently. If she gets up and walks around and then the pain/cramping will know when she is able to sleep most of the time. She denies any fevers or chills or redness or warmth.  Relevant past medical, surgical, family and social history reviewed and updated as indicated. Interim medical history since our last visit reviewed. Allergies and medications reviewed and updated.  Review of Systems  Constitutional: Negative for chills and fever.  HENT: Negative for  congestion, ear discharge, ear pain, sinus pain, sinus pressure and sneezing.   Eyes: Negative for redness and visual disturbance.  Respiratory: Negative for chest tightness and shortness of breath.   Cardiovascular: Positive for chest pain. Negative for leg swelling.  Genitourinary: Negative for difficulty urinating and dysuria.  Musculoskeletal: Positive for myalgias. Negative for back pain and gait problem.  Skin: Negative for rash.  Neurological: Negative for light-headedness and headaches.  Psychiatric/Behavioral: Negative for agitation and behavioral problems.  All other systems reviewed and are negative.   Per HPI unless specifically indicated above     Objective:    BP 127/77   Pulse 74   Temp 98.1 F (36.7 C) (Oral)   Ht 5\' 3"  (1.6 m)   Wt 158 lb (71.7 kg)   BMI 27.99 kg/m   Wt Readings from Last 3 Encounters:  06/06/16 158 lb (71.7 kg)  05/25/16 159 lb (72.1 kg)  04/20/16 151 lb 4 oz (68.6 kg)    Physical Exam  Constitutional: She is oriented to person, place, and time. She appears well-developed and well-nourished. No distress.  Eyes: Conjunctivae are normal.  Neck: Neck supple. No thyromegaly present.  Cardiovascular: Normal rate, regular rhythm, normal heart sounds and intact distal pulses.   No murmur heard. Pulmonary/Chest: Effort normal and breath sounds normal. No respiratory distress. She has no wheezes.  She has no rales.  Abdominal: Soft. Bowel sounds are normal. She exhibits no distension. There is no tenderness. There is no rebound.  Musculoskeletal: Normal range of motion. She exhibits no edema, tenderness or deformity.  Lymphadenopathy:    She has no cervical adenopathy.  Neurological: She is alert and oriented to person, place, and time. Coordination normal.  Skin: Skin is warm and dry. No rash noted. She is not diaphoretic.  Psychiatric: She has a normal mood and affect. Her behavior is normal.  Nursing note and vitals reviewed.     Assessment &  Plan:   Problem List Items Addressed This Visit    None    Visit Diagnoses    Chest pain, unspecified type    -  Primary   EKG is normal, less last than a minute each time, likely GERD but instructed her to call her cardiologist up   Relevant Orders   EKG 12-Lead (Completed)   Leg cramping       Mostly right leg, mostly at night, could be restless leg syndrome, try baclofen in the evenings first if does not help we'll talk about other medications       Follow up plan: Return if symptoms worsen or fail to improve.  Counseling provided for all of the vaccine components Orders Placed This Encounter  Procedures  . EKG 12-Lead    Caryl Pina, MD [facility] 06/06/2016, 10:38 AM

## 2016-06-27 DIAGNOSIS — M79671 Pain in right foot: Secondary | ICD-10-CM | POA: Diagnosis not present

## 2016-07-04 DIAGNOSIS — R3912 Poor urinary stream: Secondary | ICD-10-CM | POA: Diagnosis not present

## 2016-07-04 DIAGNOSIS — R339 Retention of urine, unspecified: Secondary | ICD-10-CM | POA: Diagnosis not present

## 2016-07-04 DIAGNOSIS — N281 Cyst of kidney, acquired: Secondary | ICD-10-CM | POA: Diagnosis not present

## 2016-07-13 NOTE — Progress Notes (Signed)
Cardiology Office Note    Date:  07/14/2016   ID:  Amy Black, DOB [birth year], MRN [id number]  PCP:  [name], MD  Cardiologist: Dr. Percival Spanish  Chief Complaint  Patient presents with  . Follow-up  . Chest Pain    Tightness.    History of Present Illness:    Amy Black is a 76 y.o. female with past medical history of CAD (minimal CAD by cath in 2016 with presumed spasm), HTN, GERD and palpitations who presents to the office today for evaluation of dizziness.   She was examined by Dr. Percival Spanish on 03/15/2016 and reported improvement of her chest pain since being taken off of Verapamil and started on Cardizem. She denied any recent chest pain, dyspnea on exertion, or palpitations at the time of her visit.   She was evaluated by her PCP on 06/06/2016 and reported chest pain two days prior which occurred at rest and another episode which awoke her from sleep with each episode lasting less than one minute. An EKG was obtained at that time and showed NSR, HR 71, with nonspecific ST changes along V3 and V4.   In talking with the patient today, she reports episodes of chest discomfort occurring for the past several weeks. These mostly occur when at rest and lasts for a few seconds to minutes at a time. Says they feel identical to the symptoms she experienced in 2016. She reports good compliance with her medication regimen and denies missing any recent doses. No exertional chest discomfort or dyspnea on exertion.  No recent orthopnea, PND, or lower extremity edema.   Past Medical History:  Diagnosis Date  . Adenocarcinoma (HCC)    LEFT LEG  . Adenocarcinoma of unknown primary ([place]) 02/06/2011  . 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 Allergy   . Anxiety   . CAD (coronary artery disease)    a. minimal CAD by cath in 2016 with presumed spasm  . Cataract    bilateral cataracts removed  . Coronary artery spasm ([place])   . Diverticulosis of colon  (without mention of hemorrhage)   . GERD (gastroesophageal reflux disease)   . hepatic cyst   . Hip fracture, right ([place])   . History of cardiac catheterization   . History of nuclear stress test 04/2011   lexiscan; normal study, no significant ischemia, low risk   . HTN (hypertension)    PT. DENIES  . Hyperlipidemia   . Insomnia   . Irritable bowel syndrome   . Transverse myelitis ([place])   . Unstable angina ([place])   . Vitamin D deficiency     Past Surgical History:  Procedure Laterality Date  . ABDOMINAL HYSTERECTOMY Bilateral   . CARDIAC CATHETERIZATION     coronary spasm  . CARDIAC CATHETERIZATION N/A 08/28/2014   Procedure: Left Heart Cath and Coronary Angiography;  Surgeon: Jettie Booze, MD;  Location: [facility];  Service: Cardiovascular;  Laterality: N/A;  . COLONOSCOPY    . DIAGNOSTIC LAPAROSCOPY    . LYSIS OF ADHESION    . PELVIC LAPAROSCOPY  1992   RSO, AND LSO ON 2007  . RESECTION SOFT TISSUE TUMOR LEG / ANKLE RADICAL  2004   leg lesion resection & radiation  . SALPINGOOPHORECTOMY Left   . TOTAL HIP ARTHROPLASTY Right 10/04/2015   Procedure: TOTAL HIP ARTHROPLASTY ANTERIOR APPROACH;  Surgeon: Rod Can, MD;  Location: [facility];  Service: Orthopedics;  Laterality: Right;  .  TRANSTHORACIC ECHOCARDIOGRAM  07/2012   LV cavity size mildly reduced, normal wall motion; MV with calcified annulus and mild MR; LA mildly dilated; atrial septum with increased thickness - lipomatous hypertrophy; RV systolic pressure increased (borderline pulm HTN)  . UPPER GASTROINTESTINAL ENDOSCOPY      Current Medications: Outpatient Medications Prior to Visit  Medication Sig Dispense Refill  . ALPRAZolam (XANAX) 0.5 MG tablet Take 1 tablet (0.5 mg total) by mouth 3 (three) times daily. 90 tablet 5  . aspirin 81 MG tablet Take 1 tablet (81 mg total) by mouth 2 (two) times daily with a meal. 30 tablet   . baclofen (LIORESAL) 10 MG tablet TAKE ONE TABLET BY MOUTH THREE TIMES DAILY  30 tablet 1  . Calcium Citrate-Vitamin D (CALCIUM CITRATE + D3) 315-250 MG-UNIT TABS Take 2 tablets by mouth daily. 120 tablet   . cetirizine (ZYRTEC) 10 MG tablet Take 1 tablet (10 mg total) by mouth 2 (two) times daily. 30 tablet 0  . ferrous sulfate 325 (65 FE) MG tablet TAKE ONE TABLET BY MOUTH DAILY WITH BREAKFAST 30 tablet 5  . fluticasone (FLONASE) 50 MCG/ACT nasal spray Place 2 sprays into both nostrils daily. 16 g 6  . loperamide (IMODIUM) 2 MG capsule TAKE ONE CAPSULE BY MOUTH FOUR TIMES DAILY AS NEEDED FOR DIARRHEA / LOOSE STOOLS 120 capsule 3  . NITROSTAT 0.4 MG SL tablet DISSOLVE ONE TABLET UNDER TONGUE EVERY 5 MINUTES UP TO 3 DOSES AS NEEDED FOR CHEST PAIN 25 tablet 5  . pantoprazole (PROTONIX) 40 MG tablet TAKE ONE TABLET BY MOUTH TWICE DAILY. KEEP APPOINTMENT. 180 tablet 0  . promethazine (PHENERGAN) 12.5 MG tablet Take 1 tablet (12.5 mg total) by mouth every 8 (eight) hours as needed for nausea or vomiting. 20 tablet 0  . traMADol (ULTRAM) 50 MG tablet TAKE ONE TABLET BY MOUTH EVERY TWELVE HOURS AS NEEDED. 60 tablet 5  . triamterene-hydrochlorothiazide (DYAZIDE) 37.5-25 MG capsule TAKE ONE CAPSULE BY MOUTH 3 TIMES PER WEEK 30 capsule 4  . diltiazem (CARDIZEM CD) 240 MG 24 hr capsule Take 1 capsule (240 mg total) by mouth daily. 90 capsule 2   No facility-administered medications prior to visit.      Allergies:   Iodine; Cymbalta [duloxetine hcl]; Gabapentin; Ivp dye [iodinated diagnostic agents]; and Zanaflex [tizanidine hcl]   Social History   Social History  . Marital status: Married    Spouse name: N/A  . Number of children: 2  . Years of education: N/A   Occupational History  . Disabilty   . DISABILITY Unemployed   Social History Main Topics  . Smoking status: Former Smoker    Packs/day: 0.50    Years: 4.00    Types: Cigarettes    Start date: 12/07/1968    Quit date: 11/20/1972  . Smokeless tobacco: Never Used     Comment: quit 40 years ago  . Alcohol use No   . Drug use: No  . Sexual activity: No   Other Topics Concern  . None   Social History Narrative  . None     Family History:  The patient's family history includes Bladder Cancer in her mother; Depression in her sister; Diabetes in her brother; Heart disease in her father and mother; Hyperlipidemia in her sister; Hypertension in her brother and mother; Seizures in her brother; Stroke in her father.   Review of Systems:   Please see the history of present illness.     General:  No chills, fever, night  sweats or weight changes.  Cardiovascular:  No dyspnea on exertion, edema, orthopnea, palpitations, paroxysmal nocturnal dyspnea. Positive for chest pain.  Dermatological: No rash, lesions/masses Respiratory: No cough, dyspnea Urologic: No hematuria, dysuria Abdominal:   No nausea, vomiting, diarrhea, bright red blood per rectum, melena, or hematemesis Neurologic:  No visual changes, wkns, changes in mental status. All other systems reviewed and are otherwise negative except as noted above.   Physical Exam:    VS:  BP 128/70   Pulse 63   Ht 5\' 3"  (1.6 m)   Wt 168 lb (76.2 kg)   BMI 29.76 kg/m    General: Well developed, well nourished [place] American female appearing in no acute distress. Head: Normocephalic, atraumatic, sclera non-icteric, no xanthomas, nares are without discharge.  Neck: No carotid bruits. JVD not elevated.  Lungs: Respirations regular and unlabored, without wheezes or rales.  Heart: Regular rate and rhythm. No S3 or S4.  No murmur, no rubs, or gallops appreciated. Abdomen: Soft, non-tender, non-distended with normoactive bowel sounds. No hepatomegaly. No rebound/guarding. No obvious abdominal masses. Msk:  Strength and tone appear normal for age. No joint deformities or effusions. Extremities: No clubbing or cyanosis. No lower extremity edema.  Distal pedal pulses are 2+ bilaterally. Neuro: Alert and oriented X 3. Moves all extremities spontaneously. No  focal deficits noted. Psych:  Responds to questions appropriately with a normal affect. Skin: No rashes or lesions noted  Wt Readings from Last 3 Encounters:  07/14/16 168 lb (76.2 kg)  06/06/16 158 lb (71.7 kg)  05/25/16 159 lb (72.1 kg)     Studies/Labs Reviewed:   EKG:  EKG is ordered today.  The ekg ordered today demonstrates NSR, HR 63, with no acute ST or T-wave changes when compared to prior tracings.   Recent Labs: 04/20/2016: ALT 11; BUN 11; Creatinine, Ser 0.86; Hemoglobin 12.1; Platelets 456; Potassium 4.7; Sodium 140   Lipid Panel    Component Value Date/Time   CHOL 213 (H) 04/20/2016 1430   CHOL 203 (H) 08/29/2012 1316   TRIG 92 04/20/2016 1430   TRIG 131 08/29/2012 1316   HDL 89 04/20/2016 1430   HDL 68 08/29/2012 1316   CHOLHDL 2.4 04/20/2016 1430   LDLCALC 106 (H) 04/20/2016 1430   LDLCALC 109 (H) 08/29/2012 1316    Additional studies/ records that were reviewed today include:   Cardiac Catheterization: 08/2014  The left ventricular systolic function is normal.  Minimal CAD.   Continue preventive therapy.  F/u with Dr. Percival Spanish.  Echocardiogram: 12/19/2013 Study Conclusions  - Left ventricle: The cavity size was normal. Systolic function was normal. The estimated ejection fraction was in the range of 55% to 60%. Wall motion was normal; there were no regional wall motion abnormalities. Left ventricular diastolic function parameters were normal. - Atrial septum: No defect or patent foramen ovale was identified.  Assessment:    1. Coronary artery disease involving native coronary artery of native heart with angina pectoris with documented spasm ([place])   2. Atypical chest pain   3. Essential hypertension   4. Gastroesophageal reflux disease, esophagitis presence not specified      Plan:   In order of problems listed above:  1. Coronary Spasm/ Atypical Chest Pain - She has presumed coronary spasm with only 20% LAD stenosis noted on her  cardiac catheterization in 08/2014. - She has been experiencing episodes of chest pain which occur at rest and says these are identical to what she experienced in 2016. No exertional chest  pain or dyspnea on exertion. - EKG shows no acute ischemic changes. Would not pursue further ischemic evaluation at this time with her recent cath showing minimal CAD and symptoms being most consistent with spasm.  - was previously on Verapamil with this being switched to Cardizem. Will further titrate her Cardizem from 240mg  daily to 360mg  daily. If this does not help, can switch to Amlodipine. Will continue on Imdur 30mg  daily.   2. HTN - BP is well-controlled at 128/70. - continue current medication regimen with Cardizem dose adjustment as above.   3. GERD - continue PPI therapy.    Medication Adjustments/Labs and Tests Ordered: Current medicines are reviewed at length with the patient today.  Concerns regarding medicines are outlined above.  Medication changes, Labs and Tests ordered today are listed in the Patient Instructions below. Patient Instructions  Medication Instructions: Your Cardizem (Diltiazem) has been increased to 300 mg tablet daily, take in the morning.  Take the Imdur 30 mg and the Cardizem (Diltiazem) 300 mg in the morning and then the Triamterene-hydrochlorothiazide in the evening.  Follow-Up: Your physician recommends that you schedule a follow-up appointment in: keep routine follow-up with  Dr. Percival Spanish. Call if needing to be evaluated sooner.   If you need a refill on your cardiac medications before your next appointment, please call your pharmacy.    Signed, Amy Heritage, PA-C  07/14/2016 3:36 PM    [facility] [address], [place], [place]  [address] Phone: [phone]; Fax: [phone]  [address], [place], [name] [address] Phone: [phone]

## 2016-07-14 ENCOUNTER — Ambulatory Visit (INDEPENDENT_AMBULATORY_CARE_PROVIDER_SITE_OTHER): Payer: Medicare Other | Admitting: Student

## 2016-07-14 ENCOUNTER — Encounter: Payer: Self-pay | Admitting: Student

## 2016-07-14 VITALS — BP 128/70 | HR 63 | Ht 63.0 in | Wt 168.0 lb

## 2016-07-14 DIAGNOSIS — K219 Gastro-esophageal reflux disease without esophagitis: Secondary | ICD-10-CM | POA: Diagnosis not present

## 2016-07-14 DIAGNOSIS — R0789 Other chest pain: Secondary | ICD-10-CM

## 2016-07-14 DIAGNOSIS — I10 Essential (primary) hypertension: Secondary | ICD-10-CM | POA: Diagnosis not present

## 2016-07-14 DIAGNOSIS — I25111 Atherosclerotic heart disease of native coronary artery with angina pectoris with documented spasm: Secondary | ICD-10-CM | POA: Diagnosis not present

## 2016-07-14 MED ORDER — DILTIAZEM HCL ER COATED BEADS 300 MG PO CP24: 300.0000 mg | ORAL_CAPSULE | Freq: Every day | ORAL | 3 refills | Status: DC

## 2016-07-14 NOTE — Patient Instructions (Signed)
Medication Instructions: Your Cardizem (Diltiazem) has been increased to 300 mg tablet daily, take in the morning.  Take the Imdur 30 mg and the Cardizem (Diltiazem) 300 mg in the morning and then the Triamterene-hydrochlorothiazide in the evening.   Follow-Up: Your physician recommends that you schedule a follow-up appointment in: one year with Dr. Percival Spanish.   If you need a refill on your cardiac medications before your next appointment, please call your pharmacy.

## 2016-07-28 ENCOUNTER — Other Ambulatory Visit (HOSPITAL_BASED_OUTPATIENT_CLINIC_OR_DEPARTMENT_OTHER): Payer: Medicare Other

## 2016-07-28 ENCOUNTER — Ambulatory Visit (HOSPITAL_BASED_OUTPATIENT_CLINIC_OR_DEPARTMENT_OTHER): Payer: Medicare Other | Admitting: Hematology & Oncology

## 2016-07-28 VITALS — BP 135/69 | HR 59 | Temp 97.7°F | Resp 18 | Wt 160.0 lb

## 2016-07-28 DIAGNOSIS — D508 Other iron deficiency anemias: Secondary | ICD-10-CM | POA: Diagnosis not present

## 2016-07-28 DIAGNOSIS — D75839 Thrombocytosis, unspecified: Secondary | ICD-10-CM

## 2016-07-28 DIAGNOSIS — D473 Essential (hemorrhagic) thrombocythemia: Secondary | ICD-10-CM

## 2016-07-28 DIAGNOSIS — C801 Malignant (primary) neoplasm, unspecified: Secondary | ICD-10-CM

## 2016-07-28 DIAGNOSIS — Z9181 History of falling: Secondary | ICD-10-CM

## 2016-07-28 LAB — CBC WITH DIFFERENTIAL (CANCER CENTER ONLY)
BASO#: 0 10*3/uL (ref 0.0–0.2)
BASO%: 0.7 % (ref 0.0–2.0)
EOS%: 7.7 % — ABNORMAL HIGH (ref 0.0–7.0)
Eosinophils Absolute: 0.5 10*3/uL (ref 0.0–0.5)
HCT: 36.6 % (ref 34.8–46.6)
HGB: 12 g/dL (ref 11.6–15.9)
LYMPH#: 2.8 10*3/uL (ref 0.9–3.3)
LYMPH%: 45.4 % (ref 14.0–48.0)
MCH: 31.7 pg (ref 26.0–34.0)
MCHC: 32.8 g/dL (ref 32.0–36.0)
MCV: 97 fL (ref 81–101)
MONO#: 0.6 10*3/uL (ref 0.1–0.9)
MONO%: 10.3 % (ref 0.0–13.0)
NEUT#: 2.2 10*3/uL (ref 1.5–6.5)
NEUT%: 35.9 % — ABNORMAL LOW (ref 39.6–80.0)
Platelets: 380 10*3/uL (ref 145–400)
RBC: 3.78 10*6/uL (ref 3.70–5.32)
RDW: 13.2 % (ref 11.1–15.7)
WBC: 6.1 10*3/uL (ref 3.9–10.0)

## 2016-07-28 LAB — COMPREHENSIVE METABOLIC PANEL (CC13)
ALT: 9 IU/L (ref 0–32)
AST (SGOT): 12 IU/L (ref 0–40)
Albumin, Serum: 4.2 g/dL (ref 3.5–4.8)
Albumin/Globulin Ratio: 1.6 (ref 1.2–2.2)
Alkaline Phosphatase, S: 105 IU/L (ref 39–117)
BUN/Creatinine Ratio: 12 (ref 12–28)
BUN: 11 mg/dL (ref 8–27)
Bilirubin Total: 0.4 mg/dL (ref 0.0–1.2)
Calcium, Ser: 9.9 mg/dL (ref 8.7–10.3)
Carbon Dioxide, Total: 29 mmol/L (ref 20–29)
Chloride, Ser: 98 mmol/L (ref 96–106)
Creatinine, Ser: 0.94 mg/dL (ref 0.57–1.00)
GFR calc Af Amer: 70 mL/min/{1.73_m2} (ref >59)
GFR calc non Af Amer: 60 mL/min/{1.73_m2} (ref >59)
Globulin, Total: 2.7 g/dL (ref 1.5–4.5)
Glucose: 110 mg/dL — ABNORMAL HIGH (ref 65–99)
Potassium, Ser: 4.2 mmol/L (ref 3.5–5.2)
Sodium: 134 mmol/L (ref 134–144)
Total Protein: 6.9 g/dL (ref 6.0–8.5)

## 2016-07-28 NOTE — Progress Notes (Signed)
Hematology and Oncology Follow Up Visit  Amy Black [id number] 07/13/1940 76 y.o. 07/28/2016   Principle Diagnosis:  Transient thrombocytosis History transverse myelitis History of adenosquamous carcinoma of unknown primary  Current Therapy:   Observation    Interim History:  Amy Black is here today for a follow-up. She is doing fairly well.   We last saw her back in December. Since then, she's been doing well. She had no problems over the winter or spring.  She is walking with a cane. She had this episode of transverse myelitis about 4 years ago.  A year ago she fell and broke her right hip. She required hip replacement. This was actually in August.  So far, his been no evidence of any recurrent malignancy. His Polly has been over 10 years that this has been an issue.  Her platelet count has occasionally trended upward. Again, there is no evidence of any hematologic disorder for myeloproliferative disorder.  Her appetite is good. She's had no nausea or vomiting. She's had no change in bowel or bladder habits.  She is due for a mammogram next month.  Overall, her performance status is ECOG 1.  Medications:  Allergies as of 07/28/2016      Reactions   Iodine Itching   Cymbalta [duloxetine Hcl] Other (See Comments)   sleepiness   Gabapentin Other (See Comments)   sleepiness   Ivp Dye [iodinated Diagnostic Agents] Itching   Zanaflex [tizanidine Hcl] Other (See Comments)   Very drunk feeling next day      Medication List       Accurate as of 07/28/16  2:00 PM. Always use your most recent med list.          ALPRAZolam 0.5 MG tablet Commonly known as:  XANAX Take 1 tablet (0.5 mg total) by mouth 3 (three) times daily.   aspirin 81 MG tablet Take 1 tablet (81 mg total) by mouth 2 (two) times daily with a meal.   baclofen 10 MG tablet Commonly known as:  LIORESAL TAKE ONE TABLET BY MOUTH THREE TIMES DAILY   Calcium Citrate-Vitamin D 315-250 MG-UNIT  Tabs Commonly known as:  CALCIUM CITRATE + D3 Take 2 tablets by mouth daily.   cetirizine 10 MG tablet Commonly known as:  ZYRTEC Take 1 tablet (10 mg total) by mouth 2 (two) times daily.   diltiazem 240 MG 24 hr capsule Commonly known as:  CARDIZEM CD Take 240 mg by mouth daily.   ferrous sulfate 325 (65 FE) MG tablet TAKE ONE TABLET BY MOUTH DAILY WITH BREAKFAST   fluticasone 50 MCG/ACT nasal spray Commonly known as:  FLONASE Place 2 sprays into both nostrils daily.   isosorbide mononitrate 30 MG 24 hr tablet Commonly known as:  IMDUR Take 30 mg by mouth daily.   loperamide 2 MG capsule Commonly known as:  IMODIUM TAKE ONE CAPSULE BY MOUTH FOUR TIMES DAILY AS NEEDED FOR DIARRHEA / LOOSE STOOLS   NITROSTAT 0.4 MG SL tablet Generic drug:  nitroGLYCERIN DISSOLVE ONE TABLET UNDER TONGUE EVERY 5 MINUTES UP TO 3 DOSES AS NEEDED FOR CHEST PAIN   pantoprazole 40 MG tablet Commonly known as:  PROTONIX TAKE ONE TABLET BY MOUTH TWICE DAILY. KEEP APPOINTMENT.   promethazine 12.5 MG tablet Commonly known as:  PHENERGAN Take 1 tablet (12.5 mg total) by mouth every 8 (eight) hours as needed for nausea or vomiting.   traMADol 50 MG tablet Commonly known as:  ULTRAM TAKE ONE TABLET BY MOUTH EVERY TWELVE  HOURS AS NEEDED.   triamterene-hydrochlorothiazide 37.5-25 MG capsule Commonly known as:  DYAZIDE TAKE ONE CAPSULE BY MOUTH 3 TIMES PER WEEK       Allergies:  Allergies  Allergen Reactions  . Iodine Itching  . Cymbalta [Duloxetine Hcl] Other (See Comments)    sleepiness  . Gabapentin Other (See Comments)    sleepiness  . Ivp Dye [Iodinated Diagnostic Agents] Itching  . Zanaflex [Tizanidine Hcl] Other (See Comments)    Very drunk feeling next day    Past Medical History, Surgical history, Social history, and Family History were reviewed and updated.  Review of Systems: All other 10 point review of systems is negative.   Physical Exam:  weight is 160 lb (72.6 kg).  Her oral temperature is 97.7 F (36.5 C). Her blood pressure is 135/69 and her pulse is 59 (abnormal). Her respiration is 18 and oxygen saturation is 100%.   Wt Readings from Last 3 Encounters:  07/28/16 160 lb (72.6 kg)  07/14/16 168 lb (76.2 kg)  06/06/16 158 lb (71.7 kg)    Ocular: Sclerae unicteric, pupils equal, round and reactive to light Ear-nose-throat: Oropharynx clear, dentition fair Lymphatic: No cervical supraclavicular or axillary adenopathy Lungs no rales or rhonchi, good excursion bilaterally Heart regular rate and rhythm, no murmur appreciated Abd soft, nontender, positive bowel sounds, no liver or spleen tip palpated on exam MSK no focal spinal tenderness, no joint edema Neuro: non-focal, well-oriented, appropriate affect Breasts: Deferred  Lab Results  Component Value Date   WBC 6.1 07/28/2016   HGB 12.0 07/28/2016   HCT 36.6 07/28/2016   MCV 97 07/28/2016   PLT 380 Platelet count consistent in citrate 07/28/2016   Lab Results  Component Value Date   FERRITIN 61 01/28/2016   IRON 81 01/28/2016   TIBC 254 01/28/2016   UIBC 173 01/28/2016   IRONPCTSAT 32 01/28/2016   Lab Results  Component Value Date   RETICCTPCT 1.6 09/10/2013   RBC 3.78 07/28/2016   No results found for: KPAFRELGTCHN, LAMBDASER, KAPLAMBRATIO No results found for: IGGSERUM, IGA, IGMSERUM No results found for: Odetta Pink, SPEI   Chemistry      Component Value Date/Time   NA 140 04/20/2016 1430   NA 138 01/28/2016 1303   K 4.7 04/20/2016 1430   K 4.2 01/28/2016 1303   CL 96 04/20/2016 1430   CL 101 07/20/2014 1131   CO2 25 04/20/2016 1430   CO2 30 (H) 01/28/2016 1303   BUN 11 04/20/2016 1430   BUN 10.0 01/28/2016 1303   CREATININE 0.86 04/20/2016 1430   CREATININE 0.8 01/28/2016 1303      Component Value Date/Time   CALCIUM 9.9 04/20/2016 1430   CALCIUM 10.0 01/28/2016 1303   ALKPHOS 119 (H) 04/20/2016 1430   ALKPHOS  123 01/28/2016 1303   AST 12 04/20/2016 1430   AST 13 01/28/2016 1303   ALT 11 04/20/2016 1430   ALT 12 01/28/2016 1303   BILITOT 0.3 04/20/2016 1430   BILITOT 0.51 01/28/2016 1303     Impression and Plan: Amy Black is a 76 year old African American female with history of transient thrombocytosis and also history of adenosquamous carcinoma with unknown primary. So far, there is no evidence of recurrence.   Every looks fantastic. Her platelet count is actually better than has been for a long time.  She seems to be walking a bit better.  I still have no clue how she developed this transverse myelitis.  She certainly has recovered from the fall that she sustained a year ago in which she fractured her hip. She required a right hip replacement.  We will plan to get her back in 6 months. Volanda Napoleon, MD 6/22/20182:00 PM

## 2016-07-31 LAB — IRON AND TIBC
%SAT: 32 % (ref 21–57)
Iron: 78 ug/dL (ref 41–142)
TIBC: 246 ug/dL (ref 236–444)
UIBC: 168 ug/dL (ref 120–384)

## 2016-07-31 LAB — FERRITIN: Ferritin: 94 ng/ml (ref 9–269)

## 2016-08-03 ENCOUNTER — Other Ambulatory Visit: Payer: Self-pay | Admitting: Family Medicine

## 2016-08-03 ENCOUNTER — Telehealth: Payer: Self-pay | Admitting: *Deleted

## 2016-08-03 NOTE — Telephone Encounter (Addendum)
Message left on patient's voice mail  ----- Message from Eliezer Bottom, NP sent at 07/31/2016 10:53 AM EDT ----- Regarding: Iron  No iron needed at this time. Thank you!  Sarah  ----- Message ----- From: Interface, Lab In Three Zero One Sent: 07/28/2016   1:12 PM To: Eliezer Bottom, NP

## 2016-08-10 ENCOUNTER — Ambulatory Visit (INDEPENDENT_AMBULATORY_CARE_PROVIDER_SITE_OTHER): Payer: Medicare Other | Admitting: Family

## 2016-08-10 ENCOUNTER — Other Ambulatory Visit: Payer: Self-pay | Admitting: Cardiovascular Disease

## 2016-08-10 ENCOUNTER — Encounter: Payer: Self-pay | Admitting: Family

## 2016-08-10 VITALS — BP 125/69 | HR 68 | Temp 98.0°F | Ht 63.0 in | Wt 161.8 lb

## 2016-08-10 DIAGNOSIS — B002 Herpesviral gingivostomatitis and pharyngotonsillitis: Secondary | ICD-10-CM | POA: Diagnosis not present

## 2016-08-10 DIAGNOSIS — R51 Headache: Secondary | ICD-10-CM

## 2016-08-10 DIAGNOSIS — G373 Acute transverse myelitis in demyelinating disease of central nervous system: Secondary | ICD-10-CM | POA: Diagnosis not present

## 2016-08-10 DIAGNOSIS — R519 Headache, unspecified: Secondary | ICD-10-CM

## 2016-08-10 MED ORDER — VALACYCLOVIR HCL 1 G PO TABS: 2000.0000 mg | ORAL_TABLET | Freq: Two times a day (BID) | ORAL | 0 refills | Status: DC

## 2016-08-10 NOTE — Patient Instructions (Signed)
General Headache Without Cause A headache is pain or discomfort felt around the head or neck area. The specific cause of a headache may not be found. There are many causes and types of headaches. A few common ones are:  Tension headaches.  Migraine headaches.  Cluster headaches.  Chronic daily headaches.  Follow these instructions at home: Watch your condition for any changes. Take these steps to help with your condition: Managing pain  Take over-the-counter and prescription medicines only as told by your health care provider.  Lie down in a dark, quiet room when you have a headache.  If directed, apply ice to the head and neck area: ? Put ice in a plastic bag. ? Place a towel between your skin and the bag. ? Leave the ice on for 20 minutes, 2-3 times per day.  Use a heating pad or hot shower to apply heat to the head and neck area as told by your health care provider.  Keep lights dim if bright lights bother you or make your headaches worse. Eating and drinking  Eat meals on a regular schedule.  Limit alcohol use.  Decrease the amount of caffeine you drink, or stop drinking caffeine. General instructions  Keep all follow-up visits as told by your health care provider. This is important.  Keep a headache journal to help find out what may trigger your headaches. For example, write down: ? What you eat and drink. ? How much sleep you get. ? Any change to your diet or medicines.  Try massage or other relaxation techniques.  Limit stress.  Sit up straight, and do not tense your muscles.  Do not use tobacco products, including cigarettes, chewing tobacco, or e-cigarettes. If you need help quitting, ask your health care provider.  Exercise regularly as told by your health care provider.  Sleep on a regular schedule. Get 7-9 hours of sleep, or the amount recommended by your health care provider. Contact a health care provider if:  Your symptoms are not helped by  medicine.  You have a headache that is different from the usual headache.  You have nausea or you vomit.  You have a fever. Get help right away if:  Your headache becomes severe.  You have repeated vomiting.  You have a stiff neck.  You have a loss of vision.  You have problems with speech.  You have pain in the eye or ear.  You have muscular weakness or loss of muscle control.  You lose your balance or have trouble walking.  You feel faint or pass out.  You have confusion. This information is not intended to replace advice given to you by your health care provider. Make sure you discuss any questions you have with your health care provider. Document Released: 01/23/2005 Document Revised: 07/01/2015 Document Reviewed: 05/18/2014 Elsevier Interactive Patient Education  2017 Elsevier Inc.  

## 2016-08-10 NOTE — Progress Notes (Signed)
   Subjective:    Patient ID: Amy Black, female    DOB: 1940-03-13, 76 y.o.   MRN: [id number]  Pt presents to the office with headache that started about a month ago. PT states she was diagnosed with transverse myelitis in 2014. PT is requesting a MRI today. She reports she has seen 4 different neurologists that "do not know how to treat my transverse myelitis" and her last appt was 2 years ago.   Pt also has a "cold sore" on her left upper lip that she has tried OTC with no relief that started two days ago.  Headache   This is a new problem. The current episode started more than 1 month ago. The problem occurs intermittently. The problem has been waxing and waning. The pain is located in the left unilateral (switches from left unilateral to right unilateral and top of head) region. The pain quality is not similar to prior headaches. The quality of the pain is described as aching. The pain is at a severity of 10/10. The pain is moderate. Associated symptoms include dizziness, a visual change and weakness. Pertinent negatives include no blurred vision, ear pain, eye pain, eye redness, eye watering or loss of balance. Nothing aggravates the symptoms. She has tried nothing for the symptoms. The treatment provided no relief.      Review of Systems  HENT: Negative for ear pain.   Eyes: Negative for blurred vision, pain and redness.  Neurological: Positive for dizziness, weakness and headaches. Negative for loss of balance.  All other systems reviewed and are negative.      Objective:   Physical Exam  Constitutional: She is oriented to person, place, and time. She appears well-developed and well-nourished. No distress.  HENT:  Head: Normocephalic and atraumatic.  Eyes: Pupils are equal, round, and reactive to light.  Neck: Normal range of motion. Neck supple. No thyromegaly present.  Cardiovascular: Normal rate, regular rhythm, normal heart sounds and intact distal pulses.   No murmur  heard. Pulmonary/Chest: Effort normal and breath sounds normal. No respiratory distress. She has no wheezes.  Abdominal: Soft. Bowel sounds are normal. She exhibits no distension. There is no tenderness.  Musculoskeletal: Normal range of motion. She exhibits no edema or tenderness.  Pt using cane to walk, full ROM   Neurological: She is alert and oriented to person, place, and time.  Skin: Skin is warm and dry.  Psychiatric: She has a normal mood and affect. Her behavior is normal. Judgment and thought content normal.  Vitals reviewed.     BP 125/69   Pulse 68   Temp 98 F (36.7 C) (Oral)   Ht 5\' 3"  (1.6 m)   Wt 161 lb 12.8 oz (73.4 kg)   BMI 28.66 kg/m      Assessment & Plan:  1. Nonintractable episodic headache, unspecified headache type - MR BRAIN W WO CONTRAST; Future  2. Transverse myelitis ([place])  3. Oral herpes simplex infection Start Valtrex today Do not share drinks, lipstick Avoid oral sex - valACYclovir (VALTREX) 1000 MG tablet; Take 2 tablets (2,000 mg total) by mouth 2 (two) times daily.  Dispense: 4 tablet; Refill: 0  We will order MRI today I do not believe her headaches are being caused from her transverse myelitis. Encouraged stress management Avoid caffeine  Encourage 8-9 hours of sleep a night Keep follow up with PCP and neurologists  Evelina Dun, FNP

## 2016-08-11 ENCOUNTER — Other Ambulatory Visit: Payer: Self-pay | Admitting: Family

## 2016-08-11 ENCOUNTER — Ambulatory Visit (INDEPENDENT_AMBULATORY_CARE_PROVIDER_SITE_OTHER): Payer: Medicare Other | Admitting: Family Medicine

## 2016-08-11 ENCOUNTER — Encounter: Payer: Self-pay | Admitting: Family Medicine

## 2016-08-11 VITALS — BP 137/87 | HR 62 | Temp 97.6°F | Ht 63.0 in | Wt 162.0 lb

## 2016-08-11 DIAGNOSIS — T7849XA Other allergy, initial encounter: Secondary | ICD-10-CM | POA: Diagnosis not present

## 2016-08-11 DIAGNOSIS — T7840XA Allergy, unspecified, initial encounter: Secondary | ICD-10-CM

## 2016-08-11 DIAGNOSIS — R21 Rash and other nonspecific skin eruption: Secondary | ICD-10-CM | POA: Diagnosis not present

## 2016-08-11 MED ORDER — BETAMETHASONE SOD PHOS & ACET 6 (3-3) MG/ML IJ SUSP
6.0000 mg | Freq: Once | INTRAMUSCULAR | Status: AC
  Administered 2016-08-11: 6 mg via INTRAMUSCULAR

## 2016-08-11 NOTE — Addendum Note (Signed)
Addended by: Evelina Dun A on: 08/11/2016 08:43 AM   Modules accepted: Orders

## 2016-08-11 NOTE — Progress Notes (Signed)
Chief Complaint  Patient presents with  . Rash    pt here today c/o rash that is itching on her neck and back since yesterday    HPI  Patient presents today for Onset yesterday morning of itching primarily on the left side of her back and in the posterior left of her neck. She first noticed it when she was taking her bath yesterday morning. Seemed to progress through the day. It  is very annoying today.  PMH: Smoking status noted ROS: Per HPI  Objective: BP 137/87   Pulse 62   Temp 97.6 F (36.4 C) (Oral)   Ht 5\' 3"  (1.6 m)   Wt 162 lb (73.5 kg)   BMI 28.70 kg/m  Gen: NAD, alert, cooperative with exam HEENT: NCAT, EOMI, PERRL CV: RRR, good S1/S2, no murmur Resp: CTABL, no wheezes, non-labored Skin: There is 1 red raised papule to the left of the midline at the level of the left angle of the scapula. It measures about 6 mm. No other lesions palpable or visible. Ext: No edema, warm Neuro: Alert and oriented, No gross deficits  Assessment and plan:  1. Allergic reaction, initial encounter     Meds ordered this encounter  Medications  . betamethasone acetate-betamethasone sodium phosphate (CELESTONE) injection 6 mg     Follow up as needed.  [name], MD

## 2016-08-16 ENCOUNTER — Ambulatory Visit (HOSPITAL_COMMUNITY)
Admission: RE | Admit: 2016-08-16 | Discharge: 2016-08-16 | Disposition: A | Discharge status: Home / Self Care | Payer: Medicare Other | Source: Ambulatory Visit | Attending: Family | Admitting: Family

## 2016-08-16 DIAGNOSIS — R51 Headache: Secondary | ICD-10-CM | POA: Insufficient documentation

## 2016-08-16 DIAGNOSIS — R519 Headache, unspecified: Secondary | ICD-10-CM

## 2016-08-16 DIAGNOSIS — R9082 White matter disease, unspecified: Secondary | ICD-10-CM | POA: Diagnosis not present

## 2016-08-16 DIAGNOSIS — G373 Acute transverse myelitis in demyelinating disease of central nervous system: Secondary | ICD-10-CM

## 2016-08-16 DIAGNOSIS — R531 Weakness: Secondary | ICD-10-CM | POA: Diagnosis not present

## 2016-08-18 ENCOUNTER — Ambulatory Visit (INDEPENDENT_AMBULATORY_CARE_PROVIDER_SITE_OTHER): Payer: Medicare Other | Admitting: Family Medicine

## 2016-08-18 ENCOUNTER — Ambulatory Visit: Payer: Medicare Other | Admitting: Family Medicine

## 2016-08-18 VITALS — BP 128/73 | HR 65 | Temp 97.2°F | Ht 63.0 in | Wt 159.0 lb

## 2016-08-18 DIAGNOSIS — R21 Rash and other nonspecific skin eruption: Secondary | ICD-10-CM | POA: Diagnosis not present

## 2016-08-18 MED ORDER — MUPIROCIN 2 % EX OINT: 1.0000 "application " | TOPICAL_OINTMENT | Freq: Two times a day (BID) | CUTANEOUS | 0 refills | Status: DC

## 2016-08-18 MED ORDER — TRIAMCINOLONE ACETONIDE 0.5 % EX OINT: 1.0000 "application " | TOPICAL_OINTMENT | Freq: Two times a day (BID) | CUTANEOUS | 0 refills | Status: DC

## 2016-08-18 NOTE — Progress Notes (Signed)
   HPI  Patient presents today here with rash.  Patient explains that she's had symptoms of this same rash for about one day.  She noticed first lesions on her left calf. She did notice one her back in her right calf.  Her daughter is recently come back from a trip to [place]. She does not have any clear allergic contact. She denies any fever, chills, sweats. The lesions are more painful than they are itchy, however they are very bothersome to her. She's tried Benadryl without good improvement.    PMH: Smoking status noted ROS: Per HPI  Objective: BP 128/73   Pulse 65   Temp (!) 97.2 F (36.2 C) (Oral)   Ht 5\' 3"  (1.6 m)   Wt 159 lb (72.1 kg)   BMI 28.17 kg/m  Gen: NAD, alert, cooperative with exam HEENT: NCAT CV: RRR, good S1/S2, no murmur Resp: CTABL, no wheezes, non-labored Ext: No edema, warm Neuro: Alert and oriented Skin:  4 lesions in the left calf measuring approximately 2 cm in diameter with a small vesicular central lesion, one similar lesion on the right calf, and one similar lesion on the left upper mid back between the scapula.  Assessment and plan:  # Rash Most likely arthropod bite per my exam Given Kenalog ointment for itching relief, mupirocin ointment given as well  Considered herpetic lesion, bacterial infection ( not likely but if impetigo will be helped by simple mupirocin), urticaria which do not seem to fit her clinical picture. Discussed usual course of healing for arthropod bite Return to clinic as needed.      Meds ordered this encounter  Medications  . mupirocin ointment (BACTROBAN) 2 %    Sig: Apply 1 application topically 2 (two) times daily. antibiotic    Dispense:  22 g    Refill:  0  . triamcinolone ointment (KENALOG) 0.5 %    Sig: Apply 1 application topically 2 (two) times daily. For itching    Dispense:  30 g    Refill:  0    Laroy Apple, MD [facility] 08/18/2016, 5:46 PM

## 2016-08-18 NOTE — Patient Instructions (Signed)
Great to see you!  I think you have a bug bite of some sort, Take a good look around you rbed to see if you can find any. Let me know if these do not heal as expected.   Try mupirocin and kenalog, you will have to wait an hour or so between applications.

## 2016-09-02 ENCOUNTER — Other Ambulatory Visit: Payer: Self-pay | Admitting: Family Medicine

## 2016-09-02 DIAGNOSIS — R252 Cramp and spasm: Secondary | ICD-10-CM

## 2016-09-04 ENCOUNTER — Encounter: Payer: Medicare Other | Admitting: *Deleted

## 2016-09-04 ENCOUNTER — Telehealth: Payer: Self-pay | Admitting: Family Medicine

## 2016-09-13 ENCOUNTER — Telehealth: Payer: Self-pay | Admitting: Cardiology

## 2016-09-13 NOTE — Telephone Encounter (Signed)
New message   Pt wants to be ordered to have either an echo, or some type of scan so that someone can see what kind of chest pain she is having. States she had an appointment with Dr. Percival Spanish in February and one in June with Bernerd Pho. She states they were talking about a possible test and she would like to go through with it.

## 2016-09-13 NOTE — Telephone Encounter (Signed)
Spoke with patient regarding chest pains she has been having. Same pains she was having in June when she saw [place]. Pain come on with rest, comes and goes. Episodes are coming more frequent. Patient wants further testing. Will forward to Dr Percival Spanish and [place] for review

## 2016-09-13 NOTE — Telephone Encounter (Signed)
Stop Cardizem and start Norvasc 10 mg daily one PO.  Disp number 30 with 11 refills.

## 2016-09-14 MED ORDER — AMLODIPINE BESYLATE 10 MG PO TABS: 10.0000 mg | ORAL_TABLET | Freq: Every day | ORAL | 11 refills | Status: DC

## 2016-09-14 NOTE — Telephone Encounter (Signed)
Pt notified of Dr Percival Spanish message. Verified pharmacy and new rx sent as ordered

## 2016-09-14 NOTE — Addendum Note (Signed)
Addended by: Waylan Rocher on: 09/14/2016 09:34 AM   Modules accepted: Orders

## 2016-09-25 ENCOUNTER — Telehealth: Payer: Self-pay | Admitting: Cardiology

## 2016-09-25 NOTE — Telephone Encounter (Signed)
Agree with changing amlodipine administratio time to evening to improve tolerability.

## 2016-09-25 NOTE — Telephone Encounter (Signed)
New message   Pt has additional questions for [place]

## 2016-09-25 NOTE — Telephone Encounter (Signed)
Patient called with pharmacist's recommendations and agrees w/plan. Advised to continue monitoring home BP

## 2016-09-25 NOTE — Telephone Encounter (Signed)
New message   Pt has more questions for [place]

## 2016-09-25 NOTE — Telephone Encounter (Signed)
Returned call to patient who wanted to verify that she was supposed to stop diltiazem and replace w/amlodipine (which she has done). Advised this is correct. No further assistance needed.

## 2016-09-25 NOTE — Telephone Encounter (Signed)
New message  Pt c/o medication issue:  1. Name of Medication: Norvasc  2. How are you currently taking this medication (dosage and times per day)? 10mg    3. Are you having a reaction (difficulty breathing--STAT)? Per pt cant stay awake. When pt is woke she states she is out of it. Sweating, really hot  4. What is your medication issue? Pt would like to discuss another medication or different dosage instructions. Please call back to discuss

## 2016-09-25 NOTE — Telephone Encounter (Signed)
Returned call to patient of Dr. Percival Spanish regarding amlodipine She had her meds changed on 8/9 from diltiazem to amlodipine to help with chest pain She reports her chest pain is better Patient reports her BP is 122/70 - this is normal for her She feels "lousy" and feels drained during the day and she breaks out in a sweat She takes amlodipine in the AM  Will route to pharmacy staff/MD to advise - try taking in PM?

## 2016-09-27 ENCOUNTER — Encounter: Payer: Self-pay | Admitting: Family Medicine

## 2016-09-27 ENCOUNTER — Ambulatory Visit (INDEPENDENT_AMBULATORY_CARE_PROVIDER_SITE_OTHER): Payer: Medicare Other | Admitting: Family Medicine

## 2016-09-27 VITALS — BP 98/62 | HR 71 | Temp 96.9°F | Ht 63.0 in | Wt 159.0 lb

## 2016-09-27 DIAGNOSIS — R42 Dizziness and giddiness: Secondary | ICD-10-CM

## 2016-09-27 LAB — URINALYSIS
Bilirubin, UA: NEGATIVE
Glucose, UA: NEGATIVE
Ketones, UA: NEGATIVE
Nitrite, UA: NEGATIVE
Protein, UA: NEGATIVE
RBC, UA: NEGATIVE
Specific Gravity, UA: 1.02 (ref 1.005–1.030)
Urobilinogen, Ur: 0.2 mg/dL (ref 0.2–1.0)
pH, UA: 7 (ref 5.0–7.5)

## 2016-09-27 NOTE — Progress Notes (Signed)
Subjective:  Patient ID: Amy Black, female    DOB: 1940-10-27  Age: 76 y.o. MRN: [id number]  CC: Fatigue (pt here today c/o being "zapped" no energy and had some dizzy spells since this weekend but admits to not drinking any water and only tea. )   HPI Amy Black presents for 4 days of feeling weak and lightheaded and off balance. 4 days ago she was going about her regular day when she had sudden onset of her head feeling full and funny then a weak feeling swept over her whole body she broke out in a sweat and felt lightheaded and off balance. She denies any vertigo/room spinning. She says that episode gradually faded over hours time. However it's recurred daily. She felt faint with each spell but did not pass out she's had just a little dyspnea but no chest pain. She has a history of high blood pressure but did not check her pressure, however it is a bit low in the office today. Additionally she has a history of anemia and takes ferrous sulfate. She had a cancer approximately 6 years ago with an unknown primary. Follow-up indicates that there is no evidence for cancer recently.  Depression screen [facility] 2/9 09/27/2016 08/18/2016 08/11/2016  Decreased Interest 0 0 0  Down, Depressed, Hopeless 0 - 0  PHQ - 2 Score 0 0 0  Some recent data might be hidden    History Amy Black has a past medical history of Adenocarcinoma ([place]); Adenocarcinoma of unknown primary ([place]) (02/06/2011); Adenosquamous carcinoma; Allergy; Anxiety; CAD (coronary artery disease); Cataract; Coronary artery spasm ([place]); Diverticulosis of colon (without mention of hemorrhage); GERD (gastroesophageal reflux disease); hepatic cyst; Hip fracture, right ([place]); History of cardiac catheterization; History of nuclear stress test (04/2011); HTN (hypertension); Hyperlipidemia; Insomnia; Irritable bowel syndrome; Transverse myelitis ([place]); Unstable angina ([facility]); and Vitamin D deficiency.   She has a past surgical history that includes  Resection soft tissue tumor leg / ankle radical (2004); Diagnostic laparoscopy; Lysis of adhesion; Salpingoophorectomy (Left); Pelvic laparoscopy (1992); Cardiac catheterization; transthoracic echocardiogram (07/2012); Cardiac catheterization (N/A, 08/28/2014); Colonoscopy; Upper gastrointestinal endoscopy; Total hip arthroplasty (Right, 10/04/2015); and Abdominal hysterectomy (Bilateral).   Her family history includes Bladder Cancer in her mother; Coronary artery disease in her unknown relative; Depression in her sister; Diabetes in her brother and unknown relative; Heart disease in her father and mother; Hyperlipidemia in her sister; Hypertension in her brother and mother; Seizures in her brother; Stroke in her father.She reports that she quit smoking about 43 years ago. Her smoking use included Cigarettes. She started smoking about 47 years ago. She has a 2.00 pack-year smoking history. She has never used smokeless tobacco. She reports that she does not drink alcohol or use drugs.    ROS Review of Systems  Constitutional: Negative for activity change, appetite change and fever.  HENT: Negative for congestion, rhinorrhea and sore throat.   Eyes: Negative for visual disturbance.  Respiratory: Negative for cough and shortness of breath.   Cardiovascular: Negative for chest pain and palpitations.  Gastrointestinal: Negative for abdominal pain, diarrhea and nausea.  Genitourinary: Negative for dysuria.  Musculoskeletal: Negative for arthralgias and myalgias.  Neurological: Positive for dizziness, weakness, light-headedness and headaches. Negative for seizures, syncope and numbness.    Objective:  BP 98/62   Pulse 71   Temp (!) 96.9 F (36.1 C) (Oral)   Ht _0  (1.6 m)   Wt 159 lb (72.1 kg)   BMI 28.17 kg/m   BP Readings  from Last 3 Encounters:  09/27/16 98/62  08/18/16 128/73  08/11/16 137/87    Wt Readings from Last 3 Encounters:  09/27/16 159 lb (72.1 kg)  08/18/16 159 lb (72.1 kg)   08/11/16 162 lb (73.5 kg)     Physical Exam  Constitutional: She is oriented to person, place, and time. She appears well-developed and well-nourished. No distress.  HENT:  Head: Normocephalic and atraumatic.  Right Ear: External ear normal.  Left Ear: External ear normal.  Nose: Nose normal.  Mouth/Throat: Oropharynx is clear and moist.  Eyes: Pupils are equal, round, and reactive to light. Conjunctivae and EOM are normal.  Neck: Normal range of motion. Neck supple. No thyromegaly present.  Cardiovascular: Normal rate, regular rhythm and normal heart sounds.   No murmur heard. Pulmonary/Chest: Effort normal and breath sounds normal. No respiratory distress. She has no wheezes. She has no rales.  Abdominal: Soft. Bowel sounds are normal. She exhibits no distension. There is no tenderness.  Musculoskeletal: Normal range of motion.  Lymphadenopathy:    She has no cervical adenopathy.  Neurological: She is alert and oriented to person, place, and time. She has normal reflexes.  Skin: Skin is warm and dry.  Psychiatric: She has a normal mood and affect. Her behavior is normal. Judgment and thought content normal.      Assessment & Plan:   Amy Black was seen today for fatigue.  Diagnoses and all orders for this visit:  Dizziness -     EKG 12-Lead -     Urinalysis -     TSH -     CBC with Differential/Platelet -     CMP14+EGFR -     Vitamin B12 -     Sedimentation rate -     Urine Culture       I have discontinued Amy Black's mupirocin ointment. I am also having her maintain her cetirizine, fluticasone, loperamide, aspirin, triamterene-hydrochlorothiazide, Calcium Citrate-Vitamin D, promethazine, traMADol, ALPRAZolam, NITROSTAT, isosorbide mononitrate, ferrous sulfate, pantoprazole, valACYclovir, triamcinolone ointment, baclofen, and amLODipine.  Allergies as of 09/27/2016      Reactions   Iodine Itching   Cymbalta [duloxetine Hcl] Other (See Comments)   sleepiness    Gabapentin Other (See Comments)   sleepiness   Ivp Dye [iodinated Diagnostic Agents] Itching   Zanaflex [tizanidine Hcl] Other (See Comments)   Very drunk feeling next day      Medication List       Accurate as of 09/27/16 12:47 PM. Always use your most recent med list.          ALPRAZolam 0.5 MG tablet Commonly known as:  XANAX Take 1 tablet (0.5 mg total) by mouth 3 (three) times daily.   amLODipine 10 MG tablet Commonly known as:  NORVASC Take 1 tablet (10 mg total) by mouth daily.   aspirin 81 MG tablet Take 1 tablet (81 mg total) by mouth 2 (two) times daily with a meal.   baclofen 10 MG tablet Commonly known as:  LIORESAL TAKE ONE TABLET BY MOUTH THREE TIMES DAILY   Calcium Citrate-Vitamin D 315-250 MG-UNIT Tabs Commonly known as:  CALCIUM CITRATE + D3 Take 2 tablets by mouth daily.   cetirizine 10 MG tablet Commonly known as:  ZYRTEC Take 1 tablet (10 mg total) by mouth 2 (two) times daily.   ferrous sulfate 325 (65 FE) MG tablet TAKE ONE TABLET BY MOUTH DAILY WITH BREAKFAST   fluticasone 50 MCG/ACT nasal spray Commonly known as:  FLONASE Place 2 sprays  into both nostrils daily.   isosorbide mononitrate 30 MG 24 hr tablet Commonly known as:  IMDUR Take 30 mg by mouth daily.   loperamide 2 MG capsule Commonly known as:  IMODIUM TAKE ONE CAPSULE BY MOUTH FOUR TIMES DAILY AS NEEDED FOR DIARRHEA / LOOSE STOOLS   NITROSTAT 0.4 MG SL tablet Generic drug:  nitroGLYCERIN DISSOLVE ONE TABLET UNDER TONGUE EVERY 5 MINUTES UP TO 3 DOSES AS NEEDED FOR CHEST PAIN   pantoprazole 40 MG tablet Commonly known as:  PROTONIX TAKE ONE TABLET BY MOUTH TWICE DAILY   promethazine 12.5 MG tablet Commonly known as:  PHENERGAN Take 1 tablet (12.5 mg total) by mouth every 8 (eight) hours as needed for nausea or vomiting.   traMADol 50 MG tablet Commonly known as:  ULTRAM TAKE ONE TABLET BY MOUTH EVERY TWELVE HOURS AS NEEDED.   triamcinolone ointment 0.5 % Commonly  known as:  KENALOG Apply 1 application topically 2 (two) times daily. For itching   triamterene-hydrochlorothiazide 37.5-25 MG capsule Commonly known as:  DYAZIDE TAKE ONE CAPSULE BY MOUTH 3 TIMES PER WEEK   valACYclovir 1000 MG tablet Commonly known as:  VALTREX Take 2 tablets (2,000 mg total) by mouth 2 (two) times daily.            Discharge Care Instructions        Start     Ordered   09/27/16 0000  EKG 12-Lead     09/27/16 1214   09/27/16 0000  Urinalysis     09/27/16 1226   09/27/16 0000  TSH     09/27/16 1233   09/27/16 0000  CBC with Differential/Platelet     09/27/16 1233   09/27/16 0000  CMP14+EGFR     09/27/16 1233   09/27/16 0000  Vitamin B12     09/27/16 1235   09/27/16 0000  Sedimentation rate     09/27/16 1235   09/27/16 0000  Urine Culture     09/27/16 1246       Follow-up: Return in about 1 month (around 10/28/2016).  [name], M.D.

## 2016-09-28 LAB — CMP14+EGFR
ALT: 10 IU/L (ref 0–32)
AST: 18 IU/L (ref 0–40)
Albumin/Globulin Ratio: 1.6 (ref 1.2–2.2)
Albumin: 4.7 g/dL (ref 3.5–4.8)
Alkaline Phosphatase: 121 IU/L — ABNORMAL HIGH (ref 39–117)
BUN/Creatinine Ratio: 18 (ref 12–28)
BUN: 18 mg/dL (ref 8–27)
Bilirubin Total: 0.3 mg/dL (ref 0.0–1.2)
CO2: 26 mmol/L (ref 20–29)
Calcium: 10.3 mg/dL (ref 8.7–10.3)
Chloride: 101 mmol/L (ref 96–106)
Creatinine, Ser: 1 mg/dL (ref 0.57–1.00)
GFR calc Af Amer: 65 mL/min/{1.73_m2} (ref >59)
GFR calc non Af Amer: 56 mL/min/{1.73_m2} — ABNORMAL LOW (ref >59)
Globulin, Total: 2.9 g/dL (ref 1.5–4.5)
Glucose: 82 mg/dL (ref 65–99)
Potassium: 5.6 mmol/L — ABNORMAL HIGH (ref 3.5–5.2)
Sodium: 142 mmol/L (ref 134–144)
Total Protein: 7.6 g/dL (ref 6.0–8.5)

## 2016-09-28 LAB — CBC WITH DIFFERENTIAL/PLATELET
Basophils Absolute: 0.1 10*3/uL (ref 0.0–0.2)
Basos: 1 %
EOS (ABSOLUTE): 0.2 10*3/uL (ref 0.0–0.4)
Eos: 3 %
Hematocrit: 39.8 % (ref 34.0–46.6)
Hemoglobin: 12.7 g/dL (ref 11.1–15.9)
Immature Grans (Abs): 0 10*3/uL (ref 0.0–0.1)
Immature Granulocytes: 0 %
Lymphocytes Absolute: 2.5 10*3/uL (ref 0.7–3.1)
Lymphs: 37 %
MCH: 30 pg (ref 26.6–33.0)
MCHC: 31.9 g/dL (ref 31.5–35.7)
MCV: 94 fL (ref 79–97)
Monocytes Absolute: 0.5 10*3/uL (ref 0.1–0.9)
Monocytes: 7 %
Neutrophils Absolute: 3.6 10*3/uL (ref 1.4–7.0)
Neutrophils: 52 %
Platelets: 476 10*3/uL — ABNORMAL HIGH (ref 150–379)
RBC: 4.24 x10E6/uL (ref 3.77–5.28)
RDW: 14 % (ref 12.3–15.4)
WBC: 6.9 10*3/uL (ref 3.4–10.8)

## 2016-09-28 LAB — SEDIMENTATION RATE: Sed Rate: 8 mm/hr (ref 0–40)

## 2016-09-28 LAB — VITAMIN B12: Vitamin B-12: 354 pg/mL (ref 232–1245)

## 2016-09-28 LAB — TSH: TSH: 0.675 u[IU]/mL (ref 0.450–4.500)

## 2016-09-29 ENCOUNTER — Telehealth: Payer: Self-pay | Admitting: Family Medicine

## 2016-09-29 LAB — URINE CULTURE

## 2016-09-29 NOTE — Telephone Encounter (Signed)
Reviewed results with pt .

## 2016-10-02 ENCOUNTER — Telehealth: Payer: Self-pay | Admitting: Cardiology

## 2016-10-02 NOTE — Telephone Encounter (Signed)
New message      Pt c/o medication issue:  1. Name of Medication:  norvasc  2. How are you currently taking this medication (dosage and times per day)?  10mg  3. Are you having a reaction (difficulty breathing--STAT)? no  4. What is your medication issue?  Pt want to stop medication because it causes her to have "crazy" headaches.  Pt want to go back on verapamil.  If ok, please call in presc to [facility] in [place]

## 2016-10-02 NOTE — Telephone Encounter (Signed)
Returned the call to the patient. She stated that amlodipine is giving her bad headaches and would like to be switched back to verapamil. Will route to the provider for his recommendation.

## 2016-10-04 ENCOUNTER — Telehealth: Payer: Self-pay | Admitting: Cardiology

## 2016-10-04 NOTE — Telephone Encounter (Signed)
New message    Pt is calling asking for a call back about her medication.

## 2016-10-04 NOTE — Telephone Encounter (Signed)
Returned call to patient-patient calling to follow up on switching medication back to verapamil (see previous telephone note).  Patient states she continues to have headaches and feels "terrible".   Reports her BP has been "great" but wants to switch back to verapamil.      Per chart review:  OV 03/15/16 with Dr. Percival Spanish  "She has been on verapamil for greater than 25 years.  She has had recurrent chest pain and has been treated with increased doses of Imdur.  She had relief of her pain after being taken off of verapamil and started on Cardizem.  Since she was seen she has done well. "   Advised a message has been sent to Dr. Percival Spanish and we will follow up once reviewed.  Patient aware and verbalized understanding.

## 2016-10-10 ENCOUNTER — Telehealth: Payer: Self-pay | Admitting: Cardiology

## 2016-10-10 NOTE — Telephone Encounter (Signed)
Verapamil and diltiazem stopped due to chest pain and potential coronary spasms.  Okay to stop taking amlodipine. Monitor BP twice daily and keep records.  May need ACEi or ARB for blood pressure control. May schedule for HTN clinic for further evuluation and recommendation .

## 2016-10-10 NOTE — Telephone Encounter (Signed)
Returned call to patient of Dr. Percival Spanish who has complaints about amlodipine. This has been ongoing issue since at least 09/25/16 (see telephone notes). She spoke with Angie Fava, RN on 8/29 about this and would like to go back on verapamil. Informed patient that the message was sent to MD and we are waiting on response. Will route to MD, CMA, and pharmacy staff for assistance w/med changes.

## 2016-10-10 NOTE — Telephone Encounter (Signed)
New message   Pt is returning a call to a nurse

## 2016-10-10 NOTE — Telephone Encounter (Signed)
Attempted to contact patient. Phone line busy

## 2016-10-10 NOTE — Telephone Encounter (Signed)
Agree 

## 2016-10-11 ENCOUNTER — Telehealth: Payer: Self-pay | Admitting: Family Medicine

## 2016-10-11 DIAGNOSIS — I2510 Atherosclerotic heart disease of native coronary artery without angina pectoris: Secondary | ICD-10-CM

## 2016-10-11 NOTE — Telephone Encounter (Signed)
Pt. Needs to be seen for this. Thanks, WS 

## 2016-10-11 NOTE — Telephone Encounter (Signed)
Is this ok to refill since patient hasn't had in a while? Please advise and route to [facility]

## 2016-10-11 NOTE — Telephone Encounter (Signed)
Patient last had it filled in November of 2017. Appt made for tomorrow with [facility]

## 2016-10-11 NOTE — Telephone Encounter (Signed)
Contacted patient and notified her of pharmacist/MD recommendation to STOP amlodipine. Patient reports she is off this medication. She wants to know what medication she can take in place of amlodipine to help with her chest pain. Explained that no new med was recommended and that she is already on imdur - could try Ranexa? Patient is insistent that she needs a medication to replace amlodipine, whether it be verapamil or a different medication. She expressed concern that her medication issues have not been addressed to her satisfaction. She states she may call her PCP for recommendations, but I did inform her that her PCP may defer back to cardiology for medication requests r/t chest pain.   Will defer to Dr. Percival Spanish to advise on a medication to replace amlodipine

## 2016-10-11 NOTE — Telephone Encounter (Signed)
Cardiology referral please

## 2016-10-11 NOTE — Telephone Encounter (Signed)
What is the name of the medication? Verapamil 80mg   Have you contacted your pharmacy to request a refill? No. She has not had it for a while  Which pharmacy would you like this sent to? [facility]   Patient notified that their request is being sent to the clinical staff for review and that they should receive a call once it is complete. If they do not receive a call within 24 hours they can check with their pharmacy or our office.

## 2016-10-11 NOTE — Telephone Encounter (Signed)
Can we get her an appt in the office this week with APP to discuss.

## 2016-10-12 ENCOUNTER — Encounter: Payer: Self-pay | Admitting: Family Medicine

## 2016-10-12 ENCOUNTER — Ambulatory Visit (INDEPENDENT_AMBULATORY_CARE_PROVIDER_SITE_OTHER): Payer: Medicare Other | Admitting: Family Medicine

## 2016-10-12 VITALS — BP 122/68 | HR 70 | Temp 93.9°F | Ht 63.0 in | Wt 158.0 lb

## 2016-10-12 DIAGNOSIS — I10 Essential (primary) hypertension: Secondary | ICD-10-CM | POA: Diagnosis not present

## 2016-10-12 DIAGNOSIS — I208 Other forms of angina pectoris: Secondary | ICD-10-CM | POA: Diagnosis not present

## 2016-10-12 MED ORDER — VERAPAMIL HCL ER 120 MG PO TBCR: 120.0000 mg | EXTENDED_RELEASE_TABLET | Freq: Every day | ORAL | 1 refills | Status: DC

## 2016-10-12 NOTE — Progress Notes (Signed)
Subjective:  Patient ID: Amy Black, female    DOB: 08/14/1940  Age: 76 y.o. MRN: [id number]  CC: Medication Refill (pt here today wanting a verapamil because the Norvasc causes her to feel "hungover")   HPI Amy Black presents for  follow-up of hypertension. Patient has no history of headache chest pain or shortness of breath or recent cough. Patient also denies symptoms of TIA such as focal numbness or weakness. Patient checks  blood pressure at home. Readings recently Have been good at home. Her main concern is that the amlodipine made her feel drugged so she discontinued it. She had been taking it also for angina. That has not recurred as yet. However she does continue to take Imdur. In the past she has needed both. She would like to resume the verapamil to give her the protection she is required in the past..    History Amy Black has a past medical history of Adenocarcinoma ([place]); Adenocarcinoma of unknown primary ([place]) (02/06/2011); Adenosquamous carcinoma; Allergy; Anxiety; CAD (coronary artery disease); Cataract; Coronary artery spasm ([place]); Diverticulosis of colon (without mention of hemorrhage); GERD (gastroesophageal reflux disease); hepatic cyst; Hip fracture, right ([place]); History of cardiac catheterization; History of nuclear stress test (04/2011); HTN (hypertension); Hyperlipidemia; Insomnia; Irritable bowel syndrome; Transverse myelitis ([place]); Unstable angina ([facility]); and Vitamin D deficiency.   She has a past surgical history that includes Resection soft tissue tumor leg / ankle radical (2004); Diagnostic laparoscopy; Lysis of adhesion; Salpingoophorectomy (Left); Pelvic laparoscopy (1992); Cardiac catheterization; transthoracic echocardiogram (07/2012); Cardiac catheterization (N/A, 08/28/2014); Colonoscopy; Upper gastrointestinal endoscopy; Total hip arthroplasty (Right, 10/04/2015); and Abdominal hysterectomy (Bilateral).   Her family history includes Bladder Cancer in her  mother; Coronary artery disease in her unknown relative; Depression in her sister; Diabetes in her brother and unknown relative; Heart disease in her father and mother; Hyperlipidemia in her sister; Hypertension in her brother and mother; Seizures in her brother; Stroke in her father.She reports that she quit smoking about 43 years ago. Her smoking use included Cigarettes. She started smoking about 47 years ago. She has a 2.00 pack-year smoking history. She has never used smokeless tobacco. She reports that she does not drink alcohol or use drugs.  Current Outpatient Prescriptions on File Prior to Visit  Medication Sig Dispense Refill  . ALPRAZolam (XANAX) 0.5 MG tablet Take 1 tablet (0.5 mg total) by mouth 3 (three) times daily. 90 tablet 5  . aspirin 81 MG tablet Take 1 tablet (81 mg total) by mouth 2 (two) times daily with a meal. 30 tablet   . baclofen (LIORESAL) 10 MG tablet TAKE ONE TABLET BY MOUTH THREE TIMES DAILY 30 tablet 2  . Calcium Citrate-Vitamin D (CALCIUM CITRATE + D3) 315-250 MG-UNIT TABS Take 2 tablets by mouth daily. 120 tablet   . cetirizine (ZYRTEC) 10 MG tablet Take 1 tablet (10 mg total) by mouth 2 (two) times daily. 30 tablet 0  . ferrous sulfate 325 (65 FE) MG tablet TAKE ONE TABLET BY MOUTH DAILY WITH BREAKFAST 30 tablet 2  . fluticasone (FLONASE) 50 MCG/ACT nasal spray Place 2 sprays into both nostrils daily. 16 g 6  . isosorbide mononitrate (IMDUR) 30 MG 24 hr tablet Take 30 mg by mouth daily.    Marland Kitchen loperamide (IMODIUM) 2 MG capsule TAKE ONE CAPSULE BY MOUTH FOUR TIMES DAILY AS NEEDED FOR DIARRHEA / LOOSE STOOLS 120 capsule 3  . NITROSTAT 0.4 MG SL tablet DISSOLVE ONE TABLET UNDER TONGUE EVERY 5 MINUTES UP TO 3 DOSES  AS NEEDED FOR CHEST PAIN 25 tablet 5  . pantoprazole (PROTONIX) 40 MG tablet TAKE ONE TABLET BY MOUTH TWICE DAILY 180 tablet 2  . promethazine (PHENERGAN) 12.5 MG tablet Take 1 tablet (12.5 mg total) by mouth every 8 (eight) hours as needed for nausea or  vomiting. 20 tablet 0  . traMADol (ULTRAM) 50 MG tablet TAKE ONE TABLET BY MOUTH EVERY TWELVE HOURS AS NEEDED. 60 tablet 5  . triamcinolone ointment (KENALOG) 0.5 % Apply 1 application topically 2 (two) times daily. For itching 30 g 0  . triamterene-hydrochlorothiazide (DYAZIDE) 37.5-25 MG capsule TAKE ONE CAPSULE BY MOUTH 3 TIMES PER WEEK 30 capsule 4  . valACYclovir (VALTREX) 1000 MG tablet Take 2 tablets (2,000 mg total) by mouth 2 (two) times daily. 4 tablet 0   No current facility-administered medications on file prior to visit.     ROS Review of Systems  Constitutional: Negative for activity change, appetite change and fever.  HENT: Negative for congestion, rhinorrhea and sore throat.   Eyes: Negative for visual disturbance.  Respiratory: Negative for cough and shortness of breath.   Cardiovascular: Positive for chest pain. Negative for palpitations.  Gastrointestinal: Negative for abdominal pain, diarrhea and nausea.  Genitourinary: Negative for dysuria.  Musculoskeletal: Negative for arthralgias and myalgias.  Neurological: Positive for dizziness.    Objective:  BP 122/68   Pulse 70   Temp (!) 93.9 F (34.4 C) (Oral)   Ht 5\' 3"  (1.6 m)   Wt 158 lb (71.7 kg)   BMI 27.99 kg/m   BP Readings from Last 3 Encounters:  10/12/16 122/68  09/27/16 98/62  08/18/16 128/73    Wt Readings from Last 3 Encounters:  10/12/16 158 lb (71.7 kg)  09/27/16 159 lb (72.1 kg)  08/18/16 159 lb (72.1 kg)     Physical Exam  Constitutional: She is oriented to person, place, and time. She appears well-developed and well-nourished. No distress.  HENT:  Head: Normocephalic and atraumatic.  Eyes: Pupils are equal, round, and reactive to light. Conjunctivae are normal.  Neck: Normal range of motion. Neck supple. No thyromegaly present.  Cardiovascular: Normal rate, regular rhythm and normal heart sounds.   No murmur heard. Pulmonary/Chest: Effort normal and breath sounds normal. No  respiratory distress. She has no wheezes. She has no rales.  Abdominal: Soft. Bowel sounds are normal. She exhibits no distension. There is no tenderness.  Musculoskeletal: Normal range of motion.  Lymphadenopathy:    She has no cervical adenopathy.  Neurological: She is alert and oriented to person, place, and time.  Skin: Skin is warm and dry.  Psychiatric: She has a normal mood and affect. Her behavior is normal. Judgment and thought content normal.      Assessment & Plan:   Glenora was seen today for medication refill.  Diagnoses and all orders for this visit:  Essential hypertension  Exertional angina ([place])  Other orders -     verapamil (CALAN-SR) 120 MG CR tablet; Take 1 tablet (120 mg total) by mouth at bedtime.   Allergies as of 10/12/2016      Reactions   Iodine Itching   Amlodipine Other (See Comments)   headaches   Cymbalta [duloxetine Hcl] Other (See Comments)   sleepiness   Gabapentin Other (See Comments)   sleepiness   Ivp Dye [iodinated Diagnostic Agents] Itching   Zanaflex [tizanidine Hcl] Other (See Comments)   Very drunk feeling next day      Medication List  Accurate as of 10/12/16  5:36 PM. Always use your most recent med list.          ALPRAZolam 0.5 MG tablet Commonly known as:  XANAX Take 1 tablet (0.5 mg total) by mouth 3 (three) times daily.   aspirin 81 MG tablet Take 1 tablet (81 mg total) by mouth 2 (two) times daily with a meal.   baclofen 10 MG tablet Commonly known as:  LIORESAL TAKE ONE TABLET BY MOUTH THREE TIMES DAILY   Calcium Citrate-Vitamin D 315-250 MG-UNIT Tabs Commonly known as:  CALCIUM CITRATE + D3 Take 2 tablets by mouth daily.   cetirizine 10 MG tablet Commonly known as:  ZYRTEC Take 1 tablet (10 mg total) by mouth 2 (two) times daily.   ferrous sulfate 325 (65 FE) MG tablet TAKE ONE TABLET BY MOUTH DAILY WITH BREAKFAST   fluticasone 50 MCG/ACT nasal spray Commonly known as:  FLONASE Place 2 sprays  into both nostrils daily.   isosorbide mononitrate 30 MG 24 hr tablet Commonly known as:  IMDUR Take 30 mg by mouth daily.   loperamide 2 MG capsule Commonly known as:  IMODIUM TAKE ONE CAPSULE BY MOUTH FOUR TIMES DAILY AS NEEDED FOR DIARRHEA / LOOSE STOOLS   NITROSTAT 0.4 MG SL tablet Generic drug:  nitroGLYCERIN DISSOLVE ONE TABLET UNDER TONGUE EVERY 5 MINUTES UP TO 3 DOSES AS NEEDED FOR CHEST PAIN   pantoprazole 40 MG tablet Commonly known as:  PROTONIX TAKE ONE TABLET BY MOUTH TWICE DAILY   promethazine 12.5 MG tablet Commonly known as:  PHENERGAN Take 1 tablet (12.5 mg total) by mouth every 8 (eight) hours as needed for nausea or vomiting.   traMADol 50 MG tablet Commonly known as:  ULTRAM TAKE ONE TABLET BY MOUTH EVERY TWELVE HOURS AS NEEDED.   triamcinolone ointment 0.5 % Commonly known as:  KENALOG Apply 1 application topically 2 (two) times daily. For itching   triamterene-hydrochlorothiazide 37.5-25 MG capsule Commonly known as:  DYAZIDE TAKE ONE CAPSULE BY MOUTH 3 TIMES PER WEEK   valACYclovir 1000 MG tablet Commonly known as:  VALTREX Take 2 tablets (2,000 mg total) by mouth 2 (two) times daily.   verapamil 120 MG CR tablet Commonly known as:  CALAN-SR Take 1 tablet (120 mg total) by mouth at bedtime.            Discharge Care Instructions        Start     Ordered   10/12/16 0000  verapamil (CALAN-SR) 120 MG CR tablet  Daily at bedtime     10/12/16 1651      Meds ordered this encounter  Medications  . verapamil (CALAN-SR) 120 MG CR tablet    Sig: Take 1 tablet (120 mg total) by mouth at bedtime.    Dispense:  90 tablet    Refill:  1    Follow-up: Return in about 3 months (around 01/11/2017) for hypertension, CAD.  [name], M.D.

## 2016-10-12 NOTE — Telephone Encounter (Signed)
Called pt to make appointment for next week, because Dr Percival Spanish have open some lot on his schedule, pt stated she will call back and make appt

## 2016-10-20 ENCOUNTER — Other Ambulatory Visit: Payer: Self-pay | Admitting: Cardiology

## 2016-10-20 DIAGNOSIS — I208 Other forms of angina pectoris: Secondary | ICD-10-CM

## 2016-10-25 ENCOUNTER — Other Ambulatory Visit: Payer: Self-pay | Admitting: Family Medicine

## 2016-10-25 DIAGNOSIS — R252 Cramp and spasm: Secondary | ICD-10-CM

## 2016-10-30 ENCOUNTER — Ambulatory Visit (INDEPENDENT_AMBULATORY_CARE_PROVIDER_SITE_OTHER): Payer: Medicare Other | Admitting: Family Medicine

## 2016-10-30 ENCOUNTER — Encounter: Payer: Self-pay | Admitting: Family Medicine

## 2016-10-30 VITALS — BP 125/66 | HR 56 | Temp 96.9°F | Ht 63.0 in | Wt 160.0 lb

## 2016-10-30 DIAGNOSIS — I10 Essential (primary) hypertension: Secondary | ICD-10-CM

## 2016-10-30 DIAGNOSIS — K219 Gastro-esophageal reflux disease without esophagitis: Secondary | ICD-10-CM

## 2016-10-30 NOTE — Progress Notes (Signed)
Subjective:  Patient ID: Amy Black, female    DOB: 04/11/1940  Age: 76 y.o. MRN: [id number]  CC: Follow-up (pt here today for follow up after starting Verapamil)   HPI Amy Black presents for  follow-up of hypertension. Patient has no history of headache chest pain or shortness of breath or recent cough. Patient also denies symptoms of TIA such as focal numbness or weakness. Patient checks  blood pressure at home. Readings recently 956O systolic, 13Y diastolic. Patient denies side effects from medication. Specifically no longer having hungover feeling. States taking it regularly.  Patient in for follow-up of GERD. Currently asymptomatic taking  PPI daily. There is no chest pain or heartburn. No hematemesis and no melena. No dysphagia or choking. Onset is remote. Progression is stable. Complicating factors, none.  History Torey has a past medical history of Adenocarcinoma ([place]); Adenocarcinoma of unknown primary ([place]) (02/06/2011); Adenosquamous carcinoma; Allergy; Anxiety; CAD (coronary artery disease); Cataract; Coronary artery spasm ([place]); Diverticulosis of colon (without mention of hemorrhage); GERD (gastroesophageal reflux disease); hepatic cyst; Hip fracture, right ([place]); History of cardiac catheterization; History of nuclear stress test (04/2011); HTN (hypertension); Hyperlipidemia; Insomnia; Irritable bowel syndrome; Transverse myelitis ([place]); Unstable angina ([facility]); and Vitamin D deficiency.   She has a past surgical history that includes Resection soft tissue tumor leg / ankle radical (2004); Diagnostic laparoscopy; Lysis of adhesion; Salpingoophorectomy (Left); Pelvic laparoscopy (1992); Cardiac catheterization; transthoracic echocardiogram (07/2012); Cardiac catheterization (N/A, 08/28/2014); Colonoscopy; Upper gastrointestinal endoscopy; Total hip arthroplasty (Right, 10/04/2015); and Abdominal hysterectomy (Bilateral).   Her family history includes Bladder Cancer in her  mother; Coronary artery disease in her unknown relative; Depression in her sister; Diabetes in her brother and unknown relative; Heart disease in her father and mother; Hyperlipidemia in her sister; Hypertension in her brother and mother; Seizures in her brother; Stroke in her father.She reports that she quit smoking about 43 years ago. Her smoking use included Cigarettes. She started smoking about 47 years ago. She has a 2.00 pack-year smoking history. She has never used smokeless tobacco. She reports that she does not drink alcohol or use drugs.  Current Outpatient Prescriptions on File Prior to Visit  Medication Sig Dispense Refill  . ALPRAZolam (XANAX) 0.5 MG tablet Take 1 tablet (0.5 mg total) by mouth 3 (three) times daily. 90 tablet 5  . aspirin 81 MG tablet Take 1 tablet (81 mg total) by mouth 2 (two) times daily with a meal. 30 tablet   . baclofen (LIORESAL) 10 MG tablet TAKE ONE TABLET BY MOUTH THREE TIMES DAILY 30 tablet 2  . Calcium Citrate-Vitamin D (CALCIUM CITRATE + D3) 315-250 MG-UNIT TABS Take 2 tablets by mouth daily. 120 tablet   . cetirizine (ZYRTEC) 10 MG tablet Take 1 tablet (10 mg total) by mouth 2 (two) times daily. 30 tablet 0  . ferrous sulfate 325 (65 FE) MG tablet TAKE ONE TABLET BY MOUTH DAILY WITH BREAKFAST 30 tablet 2  . fluticasone (FLONASE) 50 MCG/ACT nasal spray Place 2 sprays into both nostrils daily. 16 g 6  . isosorbide mononitrate (IMDUR) 30 MG 24 hr tablet Take 30 mg by mouth daily.    Marland Kitchen loperamide (IMODIUM) 2 MG capsule TAKE ONE CAPSULE BY MOUTH FOUR TIMES DAILY AS NEEDED FOR DIARRHEA / LOOSE STOOLS 120 capsule 3  . NITROSTAT 0.4 MG SL tablet DISSOLVE ONE TABLET UNDER TONGUE EVERY 5 MINUTES UP TO 3 DOSES AS NEEDED FOR CHEST PAIN 25 tablet 5  . pantoprazole (PROTONIX) 40 MG tablet TAKE  ONE TABLET BY MOUTH TWICE DAILY 180 tablet 2  . promethazine (PHENERGAN) 12.5 MG tablet Take 1 tablet (12.5 mg total) by mouth every 8 (eight) hours as needed for nausea or  vomiting. 20 tablet 0  . traMADol (ULTRAM) 50 MG tablet TAKE ONE TABLET BY MOUTH EVERY TWELVE HOURS AS NEEDED. 60 tablet 5  . triamcinolone ointment (KENALOG) 0.5 % Apply 1 application topically 2 (two) times daily. For itching 30 g 0  . triamterene-hydrochlorothiazide (DYAZIDE) 37.5-25 MG capsule TAKE ONE CAPSULE BY MOUTH 3 TIMES PER WEEK 30 capsule 5  . valACYclovir (VALTREX) 1000 MG tablet Take 2 tablets (2,000 mg total) by mouth 2 (two) times daily. 4 tablet 0  . verapamil (CALAN-SR) 120 MG CR tablet Take 1 tablet (120 mg total) by mouth at bedtime. 90 tablet 1   No current facility-administered medications on file prior to visit.     ROS Review of Systems  Constitutional: Negative for activity change, appetite change and fever.  HENT: Negative for congestion, rhinorrhea and sore throat.   Eyes: Negative for visual disturbance.  Respiratory: Negative for cough and shortness of breath.   Cardiovascular: Negative for chest pain and palpitations.  Gastrointestinal: Negative for abdominal pain, diarrhea and nausea.  Genitourinary: Negative for dysuria.  Musculoskeletal: Negative for arthralgias and myalgias.    Objective:  BP 125/66   Pulse (!) 56   Temp (!) 96.9 F (36.1 C) (Oral)   Ht 5\' 3"  (1.6 m)   Wt 160 lb (72.6 kg)   BMI 28.34 kg/m   BP Readings from Last 3 Encounters:  10/30/16 125/66  10/12/16 122/68  09/27/16 98/62    Wt Readings from Last 3 Encounters:  10/30/16 160 lb (72.6 kg)  10/12/16 158 lb (71.7 kg)  09/27/16 159 lb (72.1 kg)     Physical Exam  Constitutional: She is oriented to person, place, and time. She appears well-developed and well-nourished. No distress.  HENT:  Head: Normocephalic and atraumatic.  Right Ear: External ear normal.  Left Ear: External ear normal.  Nose: Nose normal.  Mouth/Throat: Oropharynx is clear and moist.  Eyes: Pupils are equal, round, and reactive to light. Conjunctivae and EOM are normal.  Neck: Normal range of  motion. Neck supple. No thyromegaly present.  Cardiovascular: Normal rate, regular rhythm and normal heart sounds.   No murmur heard. Pulmonary/Chest: Effort normal and breath sounds normal. No respiratory distress. She has no wheezes. She has no rales.  Abdominal: Soft. Bowel sounds are normal. She exhibits no distension. There is no tenderness.  Lymphadenopathy:    She has no cervical adenopathy.  Neurological: She is alert and oriented to person, place, and time. She has normal reflexes.  Skin: Skin is warm and dry.  Psychiatric: She has a normal mood and affect. Her behavior is normal. Judgment and thought content normal.      Assessment & Plan:   Jagger was seen today for follow-up.  Diagnoses and all orders for this visit:  Essential hypertension  Gastroesophageal reflux disease without esophagitis   Allergies as of 10/30/2016      Reactions   Iodine Itching   Amlodipine Other (See Comments)   headaches   Cymbalta [duloxetine Hcl] Other (See Comments)   sleepiness   Gabapentin Other (See Comments)   sleepiness   Ivp Dye [iodinated Diagnostic Agents] Itching   Zanaflex [tizanidine Hcl] Other (See Comments)   Very drunk feeling next day      Medication List  Accurate as of 10/30/16  5:43 PM. Always use your most recent med list.          ALPRAZolam 0.5 MG tablet Commonly known as:  XANAX Take 1 tablet (0.5 mg total) by mouth 3 (three) times daily.   aspirin 81 MG tablet Take 1 tablet (81 mg total) by mouth 2 (two) times daily with a meal.   baclofen 10 MG tablet Commonly known as:  LIORESAL TAKE ONE TABLET BY MOUTH THREE TIMES DAILY   Calcium Citrate-Vitamin D 315-250 MG-UNIT Tabs Commonly known as:  CALCIUM CITRATE + D3 Take 2 tablets by mouth daily.   cetirizine 10 MG tablet Commonly known as:  ZYRTEC Take 1 tablet (10 mg total) by mouth 2 (two) times daily.   ferrous sulfate 325 (65 FE) MG tablet TAKE ONE TABLET BY MOUTH DAILY WITH  BREAKFAST   fluticasone 50 MCG/ACT nasal spray Commonly known as:  FLONASE Place 2 sprays into both nostrils daily.   isosorbide mononitrate 30 MG 24 hr tablet Commonly known as:  IMDUR Take 30 mg by mouth daily.   loperamide 2 MG capsule Commonly known as:  IMODIUM TAKE ONE CAPSULE BY MOUTH FOUR TIMES DAILY AS NEEDED FOR DIARRHEA / LOOSE STOOLS   NITROSTAT 0.4 MG SL tablet Generic drug:  nitroGLYCERIN DISSOLVE ONE TABLET UNDER TONGUE EVERY 5 MINUTES UP TO 3 DOSES AS NEEDED FOR CHEST PAIN   pantoprazole 40 MG tablet Commonly known as:  PROTONIX TAKE ONE TABLET BY MOUTH TWICE DAILY   promethazine 12.5 MG tablet Commonly known as:  PHENERGAN Take 1 tablet (12.5 mg total) by mouth every 8 (eight) hours as needed for nausea or vomiting.   traMADol 50 MG tablet Commonly known as:  ULTRAM TAKE ONE TABLET BY MOUTH EVERY TWELVE HOURS AS NEEDED.   triamcinolone ointment 0.5 % Commonly known as:  KENALOG Apply 1 application topically 2 (two) times daily. For itching   triamterene-hydrochlorothiazide 37.5-25 MG capsule Commonly known as:  DYAZIDE TAKE ONE CAPSULE BY MOUTH 3 TIMES PER WEEK   valACYclovir 1000 MG tablet Commonly known as:  VALTREX Take 2 tablets (2,000 mg total) by mouth 2 (two) times daily.   verapamil 120 MG CR tablet Commonly known as:  CALAN-SR Take 1 tablet (120 mg total) by mouth at bedtime.        Follow-up: Return in about 3 months (around 01/29/2017).  [name], M.D.

## 2016-11-01 ENCOUNTER — Ambulatory Visit: Payer: Medicare Other | Admitting: Family Medicine

## 2016-11-03 ENCOUNTER — Encounter: Payer: Self-pay | Admitting: *Deleted

## 2016-11-03 ENCOUNTER — Telehealth: Payer: Self-pay | Admitting: Family Medicine

## 2016-11-03 NOTE — Telephone Encounter (Signed)
Patient aware that she does need Shingrix vaccine and will need to contact insurance for pricing.

## 2016-11-06 ENCOUNTER — Ambulatory Visit (INDEPENDENT_AMBULATORY_CARE_PROVIDER_SITE_OTHER): Payer: Medicare Other | Admitting: *Deleted

## 2016-11-06 DIAGNOSIS — Z23 Encounter for immunization: Secondary | ICD-10-CM

## 2016-11-06 NOTE — Progress Notes (Signed)
Pt given Shingrix vaccine Tolerated well 

## 2016-11-08 ENCOUNTER — Other Ambulatory Visit: Payer: Self-pay | Admitting: Family Medicine

## 2016-11-10 NOTE — Telephone Encounter (Signed)
Rx phoned it.

## 2016-11-21 ENCOUNTER — Ambulatory Visit: Payer: Medicare Other | Admitting: Family Medicine

## 2016-11-22 DIAGNOSIS — H1013 Acute atopic conjunctivitis, bilateral: Secondary | ICD-10-CM | POA: Diagnosis not present

## 2016-11-27 ENCOUNTER — Ambulatory Visit (INDEPENDENT_AMBULATORY_CARE_PROVIDER_SITE_OTHER): Payer: Medicare Other | Admitting: Gynecology

## 2016-11-27 ENCOUNTER — Encounter: Payer: Self-pay | Admitting: Gynecology

## 2016-11-27 VITALS — BP 118/76

## 2016-11-27 DIAGNOSIS — N9089 Other specified noninflammatory disorders of vulva and perineum: Secondary | ICD-10-CM

## 2016-11-27 DIAGNOSIS — N909 Noninflammatory disorder of vulva and perineum, unspecified: Secondary | ICD-10-CM | POA: Diagnosis not present

## 2016-11-27 NOTE — Patient Instructions (Signed)
Follow-up in 2 weeks for reexamination. 

## 2016-11-27 NOTE — Progress Notes (Signed)
    [place] 20-Nov-1940 [id number]        76 y.o.  G3P3 presents having noticed a bump outside her vagina while she was bathing 2 days ago. It has persisted since she first noticed it. Is nontender and nondraining. No history of the same before.  Past medical history,surgical history, problem list, medications, allergies, family history and social history were all reviewed and documented in the EPIC chart.  Directed ROS with pertinent positives and negatives documented in the history of present illness/assessment and plan.  Exam: Amy Black assistant Vitals:   11/27/16 1202  BP: 118/76   General appearance:  Normal Abdomen soft nontender without masses guarding rebound Pelvic external BUS vagina with atrophic changes. Grape size draining area left lower labia majora in the Bartholin's cyst area. No surrounding skin erythema or cellulitis. Bimanual exam without masses or tenderness.  Assessment/Plan:  76 y.o. G3P3 with small draining area lower left labia majora. Questionable small boil versus Bartholin cyst/abscess.  it is nontender to manipulation and without significant erythema/cellulitis. As it now is draining recommend observation with daily cleansing area follow up exam in 2 weeks regardless to let me reinspect this area. Possible scenarios to include incision and drainage versus excision also discussed.patient will follow up in 2 weeks for reexamination.    Anastasio Auerbach MD, 12:21 PM 11/27/2016

## 2016-11-29 ENCOUNTER — Encounter: Payer: Self-pay | Admitting: Pediatrics

## 2016-11-29 ENCOUNTER — Ambulatory Visit (INDEPENDENT_AMBULATORY_CARE_PROVIDER_SITE_OTHER): Payer: Medicare Other | Admitting: Pediatrics

## 2016-11-29 VITALS — BP 130/75 | HR 67 | Temp 97.4°F | Ht 63.0 in | Wt 160.2 lb

## 2016-11-29 DIAGNOSIS — N3090 Cystitis, unspecified without hematuria: Secondary | ICD-10-CM | POA: Diagnosis not present

## 2016-11-29 DIAGNOSIS — R399 Unspecified symptoms and signs involving the genitourinary system: Secondary | ICD-10-CM

## 2016-11-29 LAB — URINALYSIS, COMPLETE
Bilirubin, UA: NEGATIVE
Glucose, UA: NEGATIVE
Ketones, UA: NEGATIVE
Nitrite, UA: NEGATIVE
Protein, UA: NEGATIVE
RBC, UA: NEGATIVE
Specific Gravity, UA: 1.015 (ref 1.005–1.030)
Urobilinogen, Ur: 0.2 mg/dL (ref 0.2–1.0)
pH, UA: 6 (ref 5.0–7.5)

## 2016-11-29 LAB — MICROSCOPIC EXAMINATION
Epithelial Cells (non renal): >10 /hpf — AB (ref 0–10)
WBC, UA: >30 /hpf — AB (ref 0–?)

## 2016-11-29 MED ORDER — SULFAMETHOXAZOLE-TRIMETHOPRIM 800-160 MG PO TABS: 1.0000 | ORAL_TABLET | Freq: Two times a day (BID) | ORAL | 0 refills | Status: DC

## 2016-11-29 NOTE — Progress Notes (Signed)
  Subjective:   Patient ID: Amy Black, female    DOB: 1941-01-25, 76 y.o.   MRN: [id number] CC: Back Pain  HPI: Etsuko Dierolf Vea is a 76 y.o. female presenting for Back Pain  Feels achy in her back Also with some urinary urgency and frequency Ongoing x 4 days No fevers Appetite has been fine Feels like prior UTIs Otherwise feeling well No abd pain  Relevant past medical, surgical, family and social history reviewed. Allergies and medications reviewed and updated. History  Smoking Status  . Former Smoker  . Packs/day: 0.50  . Years: 4.00  . Types: Cigarettes  . Start date: 12/07/1968  . Quit date: 11/20/1972  Smokeless Tobacco  . Never Used    Comment: quit 40 years ago   ROS: Per HPI   Objective:    BP 130/75   Pulse 67   Temp (!) 97.4 F (36.3 C) (Oral)   Ht 5\' 3"  (1.6 m)   Wt 160 lb 3.2 oz (72.7 kg)   BMI 28.38 kg/m   Wt Readings from Last 3 Encounters:  11/29/16 160 lb 3.2 oz (72.7 kg)  10/30/16 160 lb (72.6 kg)  10/12/16 158 lb (71.7 kg)    Gen: NAD, alert, cooperative with exam, NCAT EYES: EOMI, no conjunctival injection, or no icterus ENT:  OP without erythema CV: NRRR, normal S1/S2, no murmur, distal pulses 2+ b/l Resp: CTABL, no wheezes, normal WOB Abd: +BS, soft, NTND. no guarding or organomegaly, no CVA tenderness Ext: No edema, warm Neuro: Alert and oriented MSK: normal muscle bulk  Assessment & Plan:  Mariangela was seen today for back pain.  Diagnoses and all orders for this visit:  UTI symptoms UA + Start below F/u culture -     Urinalysis, Complete -     sulfamethoxazole-trimethoprim (BACTRIM DS,SEPTRA DS) 800-160 MG tablet; Take 1 tablet by mouth 2 (two) times daily.  Cystitis -     Urine Culture  Other orders -     Microscopic Examination   Follow up plan: Return if symptoms worsen or fail to improve. Assunta Found, MD [place]

## 2016-12-01 ENCOUNTER — Other Ambulatory Visit: Payer: Self-pay | Admitting: Pediatrics

## 2016-12-01 LAB — URINE CULTURE

## 2016-12-01 MED ORDER — CEPHALEXIN 500 MG PO CAPS: 500.0000 mg | ORAL_CAPSULE | Freq: Two times a day (BID) | ORAL | 0 refills | Status: DC

## 2016-12-04 ENCOUNTER — Other Ambulatory Visit: Payer: Self-pay | Admitting: Cardiology

## 2016-12-04 ENCOUNTER — Telehealth: Payer: Self-pay | Admitting: Family Medicine

## 2016-12-04 DIAGNOSIS — I208 Other forms of angina pectoris: Secondary | ICD-10-CM

## 2016-12-04 NOTE — Telephone Encounter (Signed)
Pt requesting refill on Imdur Please advise

## 2016-12-04 NOTE — Telephone Encounter (Signed)
Okay to refill all meds for 6 mos 

## 2016-12-04 NOTE — Telephone Encounter (Signed)
Refill sent in by cardiologist Pt notified

## 2016-12-04 NOTE — Telephone Encounter (Signed)
REFILL 

## 2016-12-12 ENCOUNTER — Ambulatory Visit (INDEPENDENT_AMBULATORY_CARE_PROVIDER_SITE_OTHER): Payer: Medicare Other | Admitting: Family Medicine

## 2016-12-12 ENCOUNTER — Ambulatory Visit: Payer: Medicare Other | Admitting: Gynecology

## 2016-12-12 ENCOUNTER — Encounter: Payer: Self-pay | Admitting: Family Medicine

## 2016-12-12 ENCOUNTER — Other Ambulatory Visit: Payer: Self-pay | Admitting: Family Medicine

## 2016-12-12 ENCOUNTER — Telehealth: Payer: Self-pay | Admitting: *Deleted

## 2016-12-12 VITALS — BP 127/69 | HR 54 | Temp 97.0°F | Ht 63.0 in | Wt 158.0 lb

## 2016-12-12 DIAGNOSIS — M79651 Pain in right thigh: Secondary | ICD-10-CM | POA: Diagnosis not present

## 2016-12-12 DIAGNOSIS — M1611 Unilateral primary osteoarthritis, right hip: Secondary | ICD-10-CM | POA: Diagnosis not present

## 2016-12-12 MED ORDER — MELOXICAM 15 MG PO TABS
15.0000 mg | ORAL_TABLET | Freq: Every day | ORAL | 5 refills | Status: DC
Start: 2016-12-12 — End: 2017-03-01

## 2016-12-12 MED ORDER — MELOXICAM 15 MG PO TABS: 15.0000 mg | ORAL_TABLET | Freq: Every day | ORAL | 5 refills | Status: DC

## 2016-12-12 MED ORDER — CELECOXIB 200 MG PO CAPS: 200.0000 mg | ORAL_CAPSULE | Freq: Every day | ORAL | 2 refills | Status: DC

## 2016-12-12 NOTE — Patient Instructions (Signed)
Apply heat to thigh every 2-4 hours as needed.

## 2016-12-12 NOTE — Telephone Encounter (Signed)
Pt notified of new RX Verbalizes understanding 

## 2016-12-12 NOTE — Telephone Encounter (Signed)
I sent in the requested prescription 

## 2016-12-12 NOTE — Progress Notes (Signed)
Chief Complaint  Patient presents with  . Leg Pain    pt here today c/o right upper leg pain and she doesn't remember hitting it or injuring it.    HPI  Patient presents today for pain in the right upper thigh anteriorly.  Onset about 3 days ago.  She states the pain was 10/10 at onset it is a gnawing sensation.  It has dropped down to a 4/10 currently.  She denies having had any injury from this.  It is interfering with her ability to ambulate normally.  There has been no swelling. PMH: Smoking status noted ROS: Review of Systems  Constitutional: Negative for activity change, appetite change and fever.  HENT: Negative for congestion, rhinorrhea and sore throat.   Eyes: Negative for visual disturbance.  Respiratory: Negative for cough and shortness of breath.   Cardiovascular: Negative for chest pain, palpitations and leg swelling.  Gastrointestinal: Negative for abdominal pain, diarrhea and nausea.  Genitourinary: Negative for dysuria.  Musculoskeletal: Positive for myalgias. Negative for arthralgias.    Objective: BP 127/69   Pulse (!) 54   Temp (!) 97 F (36.1 C) (Oral)   Ht 5\' 3"  (1.6 m)   Wt 158 lb (71.7 kg)   BMI 27.99 kg/m  Gen: NAD, alert, cooperative with exam HEENT: NCAT, EOMI, PERRL CV: RRR, good S1/S2, no murmur Resp: CTABL, no wheezes, non-labored Ext: No edema, warm there is a 1 cm hematoma at the proximal anterior right thigh.  The surrounding region is mildly to moderately tender without palpable mass spasm etc.  Palpation of the greater trochanteric area is tender.  There is some discomfort on hip rotation passively is. Neuro: Alert and oriented, No gross deficits  Assessment and plan:  1. Acute pain of right thigh   2. Arthritis of right hip     Meds ordered this encounter  Medications  . DISCONTD: celecoxib (CELEBREX) 200 MG capsule    Sig: Take 1 capsule (200 mg total) daily by mouth.    Dispense:  30 capsule    Refill:  2  . meloxicam (MOBIC) 15  MG tablet    Sig: Take 1 tablet (15 mg total) daily by mouth. For joint and muscle pain    Dispense:  30 tablet    Refill:  5    Change made from Celebrex to meloxicam based on feedback from pharmacy stating that patient reported to them that the Celebrex was too expensive.  Follow up as needed.  [name], MD

## 2016-12-18 ENCOUNTER — Ambulatory Visit: Payer: Medicare Other | Admitting: Gynecology

## 2016-12-18 ENCOUNTER — Telehealth: Payer: Self-pay | Admitting: Family Medicine

## 2016-12-19 ENCOUNTER — Ambulatory Visit: Payer: Medicare Other | Admitting: Obstetrics & Gynecology

## 2016-12-19 NOTE — Telephone Encounter (Signed)
Pt notified next Shingrix due after 01/06/2017

## 2016-12-22 ENCOUNTER — Telehealth: Payer: Self-pay | Admitting: Cardiology

## 2016-12-22 NOTE — Telephone Encounter (Signed)
Follow up  Patient calling back states she got a notice that she will be seen in [place] office by Dr Bronson Ing.  Patient states she never requested to be seen in [place] office. States [facility] staff asked her to contact Pam in [place] office. Please call.

## 2016-12-22 NOTE — Telephone Encounter (Signed)
Spoke with pt, she stated that she received a letter from our [facility] [facility] office for her to f/u with an [place], pt stated that all her doctor are in [place] and she don't mind coming to [facility] office, she does not want to be f/u in [place], pt asked if Dr Percival Spanish still come out to [facility] advised that he do, appt was schedule for 03/14/2017 @ 3:20 pm.

## 2016-12-22 NOTE — Telephone Encounter (Signed)
S/w pt she states that someone in [place] sent her a letter and it states that she needed to call and make an appt with dr Bronson Ing, she states that she does not know why and would like Nya to call her.

## 2016-12-22 NOTE — Telephone Encounter (Signed)
New message ° ° ° ° °Patient returning call. Please call °

## 2016-12-26 DIAGNOSIS — Z1231 Encounter for screening mammogram for malignant neoplasm of breast: Secondary | ICD-10-CM | POA: Diagnosis not present

## 2016-12-30 ENCOUNTER — Other Ambulatory Visit: Payer: Self-pay | Admitting: Family Medicine

## 2016-12-30 DIAGNOSIS — R252 Cramp and spasm: Secondary | ICD-10-CM

## 2017-01-08 ENCOUNTER — Ambulatory Visit (INDEPENDENT_AMBULATORY_CARE_PROVIDER_SITE_OTHER): Payer: Medicare Other

## 2017-01-08 DIAGNOSIS — Z23 Encounter for immunization: Secondary | ICD-10-CM

## 2017-02-01 ENCOUNTER — Ambulatory Visit: Payer: Medicare Other | Admitting: Family Medicine

## 2017-02-01 ENCOUNTER — Ambulatory Visit (INDEPENDENT_AMBULATORY_CARE_PROVIDER_SITE_OTHER): Payer: Medicare Other | Admitting: Family

## 2017-02-01 ENCOUNTER — Encounter: Payer: Self-pay | Admitting: Family

## 2017-02-01 VITALS — BP 130/71 | HR 60 | Temp 98.1°F | Ht 63.0 in | Wt 159.6 lb

## 2017-02-01 DIAGNOSIS — H109 Unspecified conjunctivitis: Secondary | ICD-10-CM

## 2017-02-01 DIAGNOSIS — L259 Unspecified contact dermatitis, unspecified cause: Secondary | ICD-10-CM | POA: Diagnosis not present

## 2017-02-01 MED ORDER — POLYMYXIN B-TRIMETHOPRIM 10000-0.1 UNIT/ML-% OP SOLN: 1.0000 [drp] | OPHTHALMIC | 0 refills | Status: DC

## 2017-02-01 MED ORDER — TRIAMCINOLONE ACETONIDE 0.025 % EX OINT: 1.0000 "application " | TOPICAL_OINTMENT | Freq: Two times a day (BID) | CUTANEOUS | 0 refills | Status: DC

## 2017-02-01 NOTE — Patient Instructions (Signed)

## 2017-02-01 NOTE — Progress Notes (Signed)
   Subjective:    Patient ID: Amy Black, female    DOB: Dec 04, 1940, 76 y.o.   MRN: [id number]  Conjunctivitis   The current episode started yesterday. The onset was sudden. The problem occurs continuously. The problem has been gradually worsening. The problem is moderate. Associated symptoms include eye itching, photophobia, rash, eye discharge, eye pain and eye redness. Pertinent negatives include no fever and no sore throat.  Rash  This is a new problem. The current episode started in the past 7 days. The problem is unchanged. Location: left face. The rash is characterized by itchiness. She was exposed to nothing. Associated symptoms include eye pain. Pertinent negatives include no fatigue, fever or sore throat. Past treatments include antibiotic cream. The treatment provided mild relief.      Review of Systems  Constitutional: Negative for fatigue and fever.  HENT: Negative for sore throat.   Eyes: Positive for photophobia, pain, discharge, redness and itching.  Skin: Positive for rash.  All other systems reviewed and are negative.      Objective:   Physical Exam  Constitutional: She is oriented to person, place, and time. She appears well-developed and well-nourished. No distress.  HENT:  Head: Normocephalic and atraumatic.  Right Ear: External ear normal.  Mouth/Throat: Oropharynx is clear and moist.  Eyes: Pupils are equal, round, and reactive to light. Left eye exhibits discharge.  Neck: Normal range of motion. Neck supple. No thyromegaly present.  Cardiovascular: Normal rate, regular rhythm, normal heart sounds and intact distal pulses.  No murmur heard. Pulmonary/Chest: Effort normal and breath sounds normal. No respiratory distress. She has no wheezes.  Abdominal: Soft. Bowel sounds are normal. She exhibits no distension. There is no tenderness.  Musculoskeletal: Normal range of motion. She exhibits no edema or tenderness.  Neurological: She is alert and oriented to  person, place, and time.  Skin: Skin is warm and dry. Rash noted.  Localized papule rash on left lower cheek   Psychiatric: She has a normal mood and affect. Her behavior is normal. Judgment and thought content normal.  Vitals reviewed.    BP 130/71   Pulse 60   Temp 98.1 F (36.7 C)   Ht 5\' 3"  (1.6 m)   Wt 159 lb 9.6 oz (72.4 kg)   BMI 28.27 kg/m       Assessment & Plan:  1. Conjunctivitis, bacterial Do not rub Good hand hygiene discussed - trimethoprim-polymyxin b (POLYTRIM) ophthalmic solution; Place 1 drop into the left eye every 4 (four) hours.  Dispense: 10 mL; Refill: 0  2. Contact dermatitis, unspecified contact dermatitis type, unspecified trigger Do not scratch Keep clean and dry Avoid irritants  - triamcinolone (KENALOG) 0.025 % ointment; Apply 1 application topically 2 (two) times daily.  Dispense: 30 g; Refill: 0   Evelina Dun, FNP

## 2017-02-16 ENCOUNTER — Ambulatory Visit: Payer: Medicare Other | Admitting: Family Medicine

## 2017-02-16 ENCOUNTER — Ambulatory Visit: Payer: Medicare Other

## 2017-02-17 ENCOUNTER — Ambulatory Visit: Payer: Medicare Other

## 2017-02-19 ENCOUNTER — Other Ambulatory Visit: Payer: Self-pay | Admitting: Family Medicine

## 2017-02-19 DIAGNOSIS — R252 Cramp and spasm: Secondary | ICD-10-CM

## 2017-02-23 ENCOUNTER — Telehealth: Payer: Self-pay | Admitting: Family Medicine

## 2017-02-23 NOTE — Telephone Encounter (Signed)
Patient has a follow up appointment scheduled. 

## 2017-03-01 ENCOUNTER — Ambulatory Visit (INDEPENDENT_AMBULATORY_CARE_PROVIDER_SITE_OTHER): Payer: Medicare Other | Admitting: Family Medicine

## 2017-03-01 ENCOUNTER — Encounter: Payer: Self-pay | Admitting: Family Medicine

## 2017-03-01 VITALS — BP 132/73 | HR 62 | Temp 96.6°F | Ht 63.0 in | Wt 160.0 lb

## 2017-03-01 DIAGNOSIS — G959 Disease of spinal cord, unspecified: Secondary | ICD-10-CM | POA: Diagnosis not present

## 2017-03-01 DIAGNOSIS — M545 Low back pain: Secondary | ICD-10-CM | POA: Diagnosis not present

## 2017-03-01 DIAGNOSIS — M25511 Pain in right shoulder: Secondary | ICD-10-CM

## 2017-03-01 MED ORDER — BETAMETHASONE SOD PHOS & ACET 6 (3-3) MG/ML IJ SUSP
6.0000 mg | Freq: Once | INTRAMUSCULAR | Status: AC
  Administered 2017-03-01: 6 mg via INTRAMUSCULAR

## 2017-03-01 MED ORDER — TRIAMTERENE-HCTZ 37.5-25 MG PO CAPS: ORAL_CAPSULE | ORAL | 5 refills | Status: DC

## 2017-03-01 MED ORDER — DICLOFENAC SODIUM 75 MG PO TBEC: 75.0000 mg | DELAYED_RELEASE_TABLET | Freq: Two times a day (BID) | ORAL | 0 refills | Status: DC

## 2017-03-01 MED ORDER — VERAPAMIL HCL ER 120 MG PO TBCR: 120.0000 mg | EXTENDED_RELEASE_TABLET | Freq: Every day | ORAL | 1 refills | Status: DC

## 2017-03-01 MED ORDER — CYCLOBENZAPRINE HCL 10 MG PO TABS: 10.0000 mg | ORAL_TABLET | Freq: Three times a day (TID) | ORAL | 0 refills | Status: DC | PRN

## 2017-03-01 NOTE — Progress Notes (Signed)
Subjective:  Patient ID: Amy Black, female    DOB: 13-Dec-1940  Age: 77 y.o. MRN: [id number]  CC: Back Pain (pt here today c/o back pain and right shoulder pain)   HPI Jahyra Sukup Wattenbarger presents for increasing pain in the right lower back.  It starts at the midline lumbar region approximately L4-S1.  It radiates across the lower back to the right lateral buttock and into the right hip.  She recently saw her hip specialist who said that her hip was fine and that this problem is in her back.  She is tried muscle relaxers and tramadol and home exercises without relief.  Pain tends to run 8-9/10 she will have a stinging sensation.  It is constant with only minimal relief from her tramadol.  She is also having some pain in the right shoulder.  It has been less pronounced but still 2008/08/09.  She is hoping that medication can help with that.  She has a full range of motion for the shoulder.  However the lower back limits her ability to flex and extend the spine and causes painful ambulation.  Ambulation is quite slow. Depression screen [facility] 2/9 03/01/2017 02/01/2017 12/12/2016  Decreased Interest 0 0 0  Down, Depressed, Hopeless 0 0 0  PHQ - 2 Score 0 0 0  Some recent data might be hidden    History Elliett has a past medical history of Adenocarcinoma ([place]), Adenocarcinoma of unknown primary ([place]) (02/06/2011), Adenosquamous carcinoma, Allergy, Anxiety, CAD (coronary artery disease), Cataract, Coronary artery spasm ([place]), Diverticulosis of colon (without mention of hemorrhage), GERD (gastroesophageal reflux disease), hepatic cyst, Hip fracture, right ([place]), History of cardiac catheterization, History of nuclear stress test (04/2011), HTN (hypertension), Hyperlipidemia, Insomnia, Irritable bowel syndrome, Transverse myelitis ([place]), Unstable angina ([place]), and Vitamin D deficiency.   She has a past surgical history that includes Resection soft tissue tumor leg / ankle radical (2004); Diagnostic laparoscopy;  Lysis of adhesion; Salpingoophorectomy (Left); Pelvic laparoscopy (1992); Cardiac catheterization; transthoracic echocardiogram (07/2012); Cardiac catheterization (N/A, 08/28/2014); Colonoscopy; Upper gastrointestinal endoscopy; Total hip arthroplasty (Right, 10/04/2015); and Abdominal hysterectomy (Bilateral).   Her family history includes Bladder Cancer in her mother; Coronary artery disease in her unknown relative; Depression in her sister; Diabetes in her brother and unknown relative; Heart disease in her father and mother; Hyperlipidemia in her sister; Hypertension in her brother and mother; Seizures in her brother; Stroke in her father.She reports that she quit smoking about 44 years ago. Her smoking use included cigarettes. She started smoking about 48 years ago. She has a 2.00 pack-year smoking history. she has never used smokeless tobacco. She reports that she does not drink alcohol or use drugs.    ROS Review of Systems  Constitutional: Negative for activity change, appetite change and fever.  HENT: Negative for congestion, rhinorrhea and sore throat.   Eyes: Negative for visual disturbance.  Respiratory: Negative for cough and shortness of breath.   Cardiovascular: Negative for chest pain and palpitations.  Gastrointestinal: Negative for abdominal pain, diarrhea and nausea.  Genitourinary: Negative for dysuria.  Musculoskeletal: Positive for arthralgias, back pain, gait problem and myalgias.    Objective:  BP 132/73   Pulse 62   Temp (!) 96.6 F (35.9 C) (Oral)   Ht 5\' 3"  (1.6 m)   Wt 160 lb (72.6 kg)   BMI 28.34 kg/m   BP Readings from Last 3 Encounters:  03/01/17 132/73  02/01/17 130/71  12/12/16 127/69    Wt Readings from Last 3  Encounters:  03/01/17 160 lb (72.6 kg)  02/01/17 159 lb 9.6 oz (72.4 kg)  12/12/16 158 lb (71.7 kg)     Physical Exam  Constitutional: She is oriented to person, place, and time. She appears well-developed and well-nourished. No distress.    HENT:  Head: Normocephalic and atraumatic.  Right Ear: External ear normal.  Left Ear: External ear normal.  Nose: Nose normal.  Mouth/Throat: Oropharynx is clear and moist.  Eyes: Conjunctivae and EOM are normal. Pupils are equal, round, and reactive to light.  Neck: Normal range of motion. Neck supple. No thyromegaly present.  Cardiovascular: Normal rate, regular rhythm and normal heart sounds.  No murmur heard. Pulmonary/Chest: Effort normal and breath sounds normal. No respiratory distress. She has no wheezes. She has no rales.  Abdominal: Soft. Bowel sounds are normal. She exhibits no distension. There is no tenderness.  Musculoskeletal: She exhibits tenderness (Is recorded on there is marked tenderness noted at the right anterior shoulder especially with rotation and abduction.  The right upper extremity is neurovascularly intact.  ).  The lower back has severe tenderness at the paraspinous musculature of L4 through S1 on the right.  There is tenderness at the sciatic notch on the right as well.  There is a positive Patrick sign.  Lymphadenopathy:    She has no cervical adenopathy.  Neurological: She is alert and oriented to person, place, and time. She has normal reflexes.  Skin: Skin is warm and dry.  Psychiatric: She has a normal mood and affect. Her behavior is normal. Judgment and thought content normal.      Assessment & Plan:   Lee-Ann was seen today for back pain.  Diagnoses and all orders for this visit:  Lumbar myelopathy (HCC) -     betamethasone acetate-betamethasone sodium phosphate (CELESTONE) injection 6 mg -     MR Lumbar Spine Wo Contrast; Future  Acute pain of right shoulder  Other orders -     diclofenac (VOLTAREN) 75 MG EC tablet; Take 1 tablet (75 mg total) by mouth 2 (two) times daily. -     cyclobenzaprine (FLEXERIL) 10 MG tablet; Take 1 tablet (10 mg total) by mouth 3 (three) times daily as needed for muscle spasms. -      triamterene-hydrochlorothiazide (DYAZIDE) 37.5-25 MG capsule; TAKE ONE CAPSULE BY MOUTH 3 TIMES PER WEEK -     verapamil (CALAN-SR) 120 MG CR tablet; Take 1 tablet (120 mg total) by mouth at bedtime.       I have discontinued Lorenz Coaster. Schreur's meloxicam and baclofen. I am also having her start on diclofenac and cyclobenzaprine. Additionally, I am having her maintain her cetirizine, fluticasone, loperamide, aspirin, Calcium Citrate-Vitamin D, traMADol, NITROSTAT, ferrous sulfate, pantoprazole, valACYclovir, ALPRAZolam, isosorbide mononitrate, trimethoprim-polymyxin b, triamcinolone, triamterene-hydrochlorothiazide, and verapamil. We administered betamethasone acetate-betamethasone sodium phosphate.  Allergies as of 03/01/2017      Reactions   Iodine Itching   Amlodipine Other (See Comments)   headaches   Cymbalta [duloxetine Hcl] Other (See Comments)   sleepiness   Gabapentin Other (See Comments)   sleepiness   Ivp Dye [iodinated Diagnostic Agents] Itching   Zanaflex [tizanidine Hcl] Other (See Comments)   Very drunk feeling next day      Medication List        Accurate as of 03/01/17  5:13 PM. Always use your most recent med list.          ALPRAZolam 0.5 MG tablet Commonly known as:  XANAX TAKE ONE TABLET  BY MOUTH THREE TIMES DAILY   aspirin 81 MG tablet Take 1 tablet (81 mg total) by mouth 2 (two) times daily with a meal.   Calcium Citrate-Vitamin D 315-250 MG-UNIT Tabs Commonly known as:  CALCIUM CITRATE + D3 Take 2 tablets by mouth daily.   cetirizine 10 MG tablet Commonly known as:  ZYRTEC Take 1 tablet (10 mg total) by mouth 2 (two) times daily.   cyclobenzaprine 10 MG tablet Commonly known as:  FLEXERIL Take 1 tablet (10 mg total) by mouth 3 (three) times daily as needed for muscle spasms.   diclofenac 75 MG EC tablet Commonly known as:  VOLTAREN Take 1 tablet (75 mg total) by mouth 2 (two) times daily.   ferrous sulfate 325 (65 FE) MG tablet TAKE ONE  TABLET BY MOUTH DAILY WITH BREAKFAST   fluticasone 50 MCG/ACT nasal spray Commonly known as:  FLONASE Place 2 sprays into both nostrils daily.   isosorbide mononitrate 30 MG 24 hr tablet Commonly known as:  IMDUR ONE TABLET BY MOUTH DAILY   loperamide 2 MG capsule Commonly known as:  IMODIUM TAKE ONE CAPSULE BY MOUTH FOUR TIMES DAILY AS NEEDED FOR DIARRHEA / LOOSE STOOLS   NITROSTAT 0.4 MG SL tablet Generic drug:  nitroGLYCERIN DISSOLVE ONE TABLET UNDER TONGUE EVERY 5 MINUTES UP TO 3 DOSES AS NEEDED FOR CHEST PAIN   pantoprazole 40 MG tablet Commonly known as:  PROTONIX TAKE ONE TABLET BY MOUTH TWICE DAILY   traMADol 50 MG tablet Commonly known as:  ULTRAM TAKE ONE TABLET BY MOUTH EVERY TWELVE HOURS AS NEEDED.   triamcinolone 0.025 % ointment Commonly known as:  KENALOG Apply 1 application topically 2 (two) times daily.   triamterene-hydrochlorothiazide 37.5-25 MG capsule Commonly known as:  DYAZIDE TAKE ONE CAPSULE BY MOUTH 3 TIMES PER WEEK   trimethoprim-polymyxin b ophthalmic solution Commonly known as:  POLYTRIM Place 1 drop into the left eye every 4 (four) hours.   valACYclovir 1000 MG tablet Commonly known as:  VALTREX Take 2 tablets (2,000 mg total) by mouth 2 (two) times daily.   verapamil 120 MG CR tablet Commonly known as:  CALAN-SR Take 1 tablet (120 mg total) by mouth at bedtime.        Follow-up: Return in about 1 month (around 04/01/2017).  [name], M.D.

## 2017-03-13 NOTE — Progress Notes (Signed)
Cardiology Office Note   Date:  03/14/2017   ID:  Amy Black, DOB 07-Mar-1940, MRN [id number]  PCP:  [name], MD  Cardiologist:   Minus Breeding, MD    Chief Complaint  Patient presents with  . Chest Pain     History of Present Illness: Amy Black is a 77 y.o. female who presents for a follow up of presumed coronary spasm. On 08/28/2014, she had left heart cath that showed minimal CAD, 20% disease in LAD was normal LVEF. He also had history of palpitations and hypertension. She has been on verapamil for greater than 25 years.  She has had recurrent chest pain and has been treated with increased doses of Imdur.  She had relief of her pain after being taken off of verapamil and started on Cardizem.  She continued to have some symptoms as described subsequently and since I last saw her has been changed back to verapamil. .  Since she was seen she has continued to get some "dullness" in her chest.  She cannot really seem to quantify or qualify this.  Is a vague sensation that happens sporadically.  She has to take about 8 nitroglycerin per month which she says is a somewhat stable pattern.  He is had coronary spasm presumed before.  She thinks it feels like that previous diagnosis.  She is somewhat limited by transverse myelitis and gets around with a cane which she cannot bring on her symptoms with activity such as making her bed or doing the dishes.  She says the discomfort is 6 out of 10.  It might last for 3-4 minutes.  There are no associated symptoms.  There is no radiation.  He denies any new shortness of breath, PND or orthopnea.     Past Medical History:  Diagnosis Date  . Adenocarcinoma (HCC)    LEFT LEG  . Adenocarcinoma of unknown primary ([place]) 02/06/2011  . 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 Allergy   . Anxiety   . CAD (coronary artery disease)    a. minimal CAD by cath in 2016 with presumed spasm  . Cataract    bilateral  cataracts removed  . Coronary artery spasm ([place])   . Diverticulosis of colon (without mention of hemorrhage)   . GERD (gastroesophageal reflux disease)   . hepatic cyst   . Hip fracture, right ([place])   . History of cardiac catheterization   . History of nuclear stress test 04/2011   lexiscan; normal study, no significant ischemia, low risk   . HTN (hypertension)    PT. DENIES  . Hyperlipidemia   . Insomnia   . Irritable bowel syndrome   . Transverse myelitis ([place])   . Unstable angina ([place])   . Vitamin D deficiency     Past Surgical History:  Procedure Laterality Date  . ABDOMINAL HYSTERECTOMY Bilateral   . CARDIAC CATHETERIZATION     coronary spasm  . CARDIAC CATHETERIZATION N/A 08/28/2014   Procedure: Left Heart Cath and Coronary Angiography;  Surgeon: Jettie Booze, MD;  Location: [facility];  Service: Cardiovascular;  Laterality: N/A;  . COLONOSCOPY    . DIAGNOSTIC LAPAROSCOPY    . LYSIS OF ADHESION    . PELVIC LAPAROSCOPY  1992   RSO, AND LSO ON 2007  . RESECTION SOFT TISSUE TUMOR LEG / ANKLE RADICAL  2004   leg lesion resection & radiation  . SALPINGOOPHORECTOMY Left   .  TOTAL HIP ARTHROPLASTY Right 10/04/2015   Procedure: TOTAL HIP ARTHROPLASTY ANTERIOR APPROACH;  Surgeon: Rod Can, MD;  Location: [facility];  Service: Orthopedics;  Laterality: Right;  . TRANSTHORACIC ECHOCARDIOGRAM  07/2012   LV cavity size mildly reduced, normal wall motion; MV with calcified annulus and mild MR; LA mildly dilated; atrial septum with increased thickness - lipomatous hypertrophy; RV systolic pressure increased (borderline pulm HTN)  . UPPER GASTROINTESTINAL ENDOSCOPY       Current Outpatient Medications  Medication Sig Dispense Refill  . ALPRAZolam (XANAX) 0.5 MG tablet TAKE ONE TABLET BY MOUTH THREE TIMES DAILY 90 tablet 5  . aspirin 81 MG tablet Take 1 tablet (81 mg total) by mouth 2 (two) times daily with a meal. 30 tablet   . Calcium Citrate-Vitamin D (CALCIUM  CITRATE + D3) 315-250 MG-UNIT TABS Take 2 tablets by mouth daily. 120 tablet   . cyclobenzaprine (FLEXERIL) 10 MG tablet Take 1 tablet (10 mg total) by mouth 3 (three) times daily as needed for muscle spasms. 60 tablet 0  . ferrous sulfate 325 (65 FE) MG tablet TAKE ONE TABLET BY MOUTH DAILY WITH BREAKFAST 30 tablet 2  . isosorbide mononitrate (IMDUR) 60 MG 24 hr tablet Take 1 tablet (60 mg total) by mouth daily. 90 tablet 3  . loperamide (IMODIUM) 2 MG capsule TAKE ONE CAPSULE BY MOUTH FOUR TIMES DAILY AS NEEDED FOR DIARRHEA / LOOSE STOOLS 120 capsule 3  . NITROSTAT 0.4 MG SL tablet DISSOLVE ONE TABLET UNDER TONGUE EVERY 5 MINUTES UP TO 3 DOSES AS NEEDED FOR CHEST PAIN 25 tablet 5  . pantoprazole (PROTONIX) 40 MG tablet TAKE ONE TABLET BY MOUTH TWICE DAILY 180 tablet 2  . traMADol (ULTRAM) 50 MG tablet TAKE ONE TABLET BY MOUTH EVERY TWELVE HOURS AS NEEDED. 60 tablet 5  . triamcinolone (KENALOG) 0.025 % ointment Apply 1 application topically 2 (two) times daily. 30 g 0  . triamterene-hydrochlorothiazide (DYAZIDE) 37.5-25 MG capsule TAKE ONE CAPSULE BY MOUTH 3 TIMES PER WEEK 30 capsule 5  . verapamil (CALAN-SR) 120 MG CR tablet Take 1 tablet (120 mg total) by mouth at bedtime. 90 tablet 3   No current facility-administered medications for this visit.     Allergies:   Iodine; Amlodipine; Cymbalta [duloxetine hcl]; Gabapentin; Ivp dye [iodinated diagnostic agents]; and Zanaflex [tizanidine hcl]    ROS:  Please see the history of present illness.   Otherwise, review of systems are positive for none.   All other systems are reviewed and negative.    PHYSICAL EXAM: VS:  BP 123/78   Pulse 69   Ht 5\' 3"  (1.6 m)   Wt 160 lb (72.6 kg)   BMI 28.34 kg/m  , BMI Body mass index is 28.34 kg/m.  GENERAL:  Well appearing NECK:  No jugular venous distention, waveform within normal limits, carotid upstroke brisk and symmetric, no bruits, no thyromegaly LUNGS:  Clear to auscultation bilaterally CHEST:   Unremarkable HEART:  PMI not displaced or sustained,S1 and S2 within normal limits, no S3, no S4, no clicks, no rubs, no murmurs ABD:  Flat, positive bowel sounds normal in frequency in pitch, no bruits, no rebound, no guarding, no midline pulsatile mass, no hepatomegaly, no splenomegaly EXT:  2 plus pulses throughout, no edema, no cyanosis no clubbing   EKG:  EKG is not  ordered today. EKG 09/27/16 sinus rhythm, rate 74, axis within normal limits, intervals within normal limits, no acute ST-T wave changes.  Recent Labs: 09/27/2016: ALT 10; BUN  18; Creatinine, Ser 1.00; Hemoglobin 12.7; Platelets 476; Potassium 5.6; Sodium 142; TSH 0.675    Lipid Panel    Component Value Date/Time   CHOL 213 (H) 04/20/2016 1430   CHOL 203 (H) 08/29/2012 1316   TRIG 92 04/20/2016 1430   TRIG 131 08/29/2012 1316   HDL 89 04/20/2016 1430   HDL 68 08/29/2012 1316   CHOLHDL 2.4 04/20/2016 1430   LDLCALC 106 (H) 04/20/2016 1430   LDLCALC 109 (H) 08/29/2012 1316      Wt Readings from Last 3 Encounters:  03/14/17 160 lb (72.6 kg)  03/01/17 160 lb (72.6 kg)  02/01/17 159 lb 9.6 oz (72.4 kg)      Other studies Reviewed: Additional studies/ records that were reviewed today include: None Review of the above records demonstrates:     ASSESSMENT AND PLAN:  Coronary Spasm: Given the ongoing discomfort I would try to increase her Imdur to 60 mg daily.  Further adjustments can be based on early response to this.    HTN:  Her BP is well controlled.  No change in therapy.   Palpitations:  She is not bothered by this.  No change in therapy.    Current medicines are reviewed at length with the patient today.  The patient does not have concerns regarding medicines.  The following changes have been made:  As above.   Labs/ tests ordered today include:  No orders of the defined types were placed in this encounter.    Disposition:   FU with Bernerd Pho PAC.   Signed, Minus Breeding, MD    03/14/2017 4:04 PM    [facility]

## 2017-03-14 ENCOUNTER — Encounter: Payer: Self-pay | Admitting: Cardiology

## 2017-03-14 ENCOUNTER — Ambulatory Visit: Payer: Medicare Other | Admitting: Cardiology

## 2017-03-14 VITALS — BP 123/78 | HR 69 | Ht 63.0 in | Wt 160.0 lb

## 2017-03-14 DIAGNOSIS — I208 Other forms of angina pectoris: Secondary | ICD-10-CM

## 2017-03-14 DIAGNOSIS — I10 Essential (primary) hypertension: Secondary | ICD-10-CM | POA: Diagnosis not present

## 2017-03-14 DIAGNOSIS — R002 Palpitations: Secondary | ICD-10-CM | POA: Diagnosis not present

## 2017-03-14 MED ORDER — ISOSORBIDE MONONITRATE ER 60 MG PO TB24: 60.0000 mg | ORAL_TABLET | Freq: Every day | ORAL | 3 refills | Status: DC

## 2017-03-14 MED ORDER — VERAPAMIL HCL ER 120 MG PO TBCR: 120.0000 mg | EXTENDED_RELEASE_TABLET | Freq: Every day | ORAL | 3 refills | Status: DC

## 2017-03-14 NOTE — Patient Instructions (Signed)
Medication Instructions:  Please increase your Imdur to 60 mg a day. Continue all other medications as listed.  Follow-Up: Follow up in 3 months in the [place] office with [place].  If you need a refill on your cardiac medications before your next appointment, please call your pharmacy.  Thank you for choosing [facility]!!

## 2017-03-19 ENCOUNTER — Ambulatory Visit
Admission: RE | Admit: 2017-03-19 | Discharge: 2017-03-19 | Disposition: A | Discharge status: Home / Self Care | Payer: Medicare Other | Source: Ambulatory Visit | Attending: Family Medicine | Admitting: Family Medicine

## 2017-03-19 DIAGNOSIS — G959 Disease of spinal cord, unspecified: Secondary | ICD-10-CM

## 2017-03-19 DIAGNOSIS — M47817 Spondylosis without myelopathy or radiculopathy, lumbosacral region: Secondary | ICD-10-CM | POA: Diagnosis not present

## 2017-03-27 DIAGNOSIS — Z471 Aftercare following joint replacement surgery: Secondary | ICD-10-CM | POA: Diagnosis not present

## 2017-03-27 DIAGNOSIS — Z96641 Presence of right artificial hip joint: Secondary | ICD-10-CM | POA: Diagnosis not present

## 2017-03-31 ENCOUNTER — Other Ambulatory Visit: Payer: Self-pay | Admitting: Family Medicine

## 2017-04-02 ENCOUNTER — Ambulatory Visit: Payer: Medicare Other | Admitting: Family Medicine

## 2017-04-07 ENCOUNTER — Other Ambulatory Visit: Payer: Self-pay | Admitting: Family Medicine

## 2017-04-09 NOTE — Telephone Encounter (Signed)
Last seen 03/01/17  Dr Stacks 

## 2017-04-13 DIAGNOSIS — N319 Neuromuscular dysfunction of bladder, unspecified: Secondary | ICD-10-CM | POA: Diagnosis not present

## 2017-04-13 DIAGNOSIS — R338 Other retention of urine: Secondary | ICD-10-CM | POA: Diagnosis not present

## 2017-05-07 ENCOUNTER — Other Ambulatory Visit: Payer: Self-pay | Admitting: Family Medicine

## 2017-05-14 ENCOUNTER — Ambulatory Visit (INDEPENDENT_AMBULATORY_CARE_PROVIDER_SITE_OTHER): Payer: Medicare Other | Admitting: Family Medicine

## 2017-05-14 ENCOUNTER — Encounter: Payer: Self-pay | Admitting: Family Medicine

## 2017-05-14 VITALS — BP 137/78 | HR 59 | Ht 63.0 in | Wt 161.0 lb

## 2017-05-14 DIAGNOSIS — F419 Anxiety disorder, unspecified: Secondary | ICD-10-CM | POA: Diagnosis not present

## 2017-05-14 DIAGNOSIS — I10 Essential (primary) hypertension: Secondary | ICD-10-CM | POA: Diagnosis not present

## 2017-05-14 DIAGNOSIS — Z79891 Long term (current) use of opiate analgesic: Secondary | ICD-10-CM | POA: Diagnosis not present

## 2017-05-14 DIAGNOSIS — M47812 Spondylosis without myelopathy or radiculopathy, cervical region: Secondary | ICD-10-CM

## 2017-05-14 DIAGNOSIS — G959 Disease of spinal cord, unspecified: Secondary | ICD-10-CM

## 2017-05-14 MED ORDER — NITROGLYCERIN 0.4 MG SL SUBL: SUBLINGUAL_TABLET | SUBLINGUAL | 5 refills | Status: DC

## 2017-05-14 MED ORDER — TRIAMTERENE-HCTZ 37.5-25 MG PO CAPS: ORAL_CAPSULE | ORAL | 5 refills | Status: DC

## 2017-05-14 MED ORDER — SERTRALINE HCL 50 MG PO TABS: 50.0000 mg | ORAL_TABLET | Freq: Every day | ORAL | 5 refills | Status: DC

## 2017-05-14 MED ORDER — TRIAMCINOLONE ACETONIDE 0.025 % EX OINT: 1.0000 "application " | TOPICAL_OINTMENT | Freq: Two times a day (BID) | CUTANEOUS | 0 refills | Status: DC

## 2017-05-14 MED ORDER — PANTOPRAZOLE SODIUM 40 MG PO TBEC: 40.0000 mg | DELAYED_RELEASE_TABLET | Freq: Two times a day (BID) | ORAL | 2 refills | Status: DC

## 2017-05-14 MED ORDER — TRAMADOL HCL 50 MG PO TABS: ORAL_TABLET | ORAL | 5 refills | Status: DC

## 2017-05-14 MED ORDER — ALPRAZOLAM 0.5 MG PO TABS: 0.5000 mg | ORAL_TABLET | Freq: Three times a day (TID) | ORAL | 5 refills | Status: DC

## 2017-05-14 NOTE — Progress Notes (Signed)
Subjective:  Patient ID: Amy Black, female    DOB: [birth year]  Age: 77 y.o. MRN: [id number]  CC: Medication Refill (pt here today wanting a refill on Tramadol for back pain and also has a cough at night)   HPI Amy Black presents for  follow-up of hypertension. Patient has no history of headache chest pain or shortness of breath or recent cough. Patient also denies symptoms of TIA such as focal numbness or weakness. Patient denies side effects from medication. States taking it regularly. Patient needs to have her meds for anxiety renewed.  She is very nervous frequently feeling like something bad is going to happen and occasionally has physical symptoms of chest tightening.  She does not get shortness of breath.  She also has arthritic pain primarily in the neck and the knees.  She uses tramadol and diclofenac for this she is due for refills.  History Amy Black has a past medical history of Adenocarcinoma ([place]), Adenocarcinoma of unknown primary ([place]) (02/06/2011), Adenosquamous carcinoma, Allergy, Anxiety, CAD (coronary artery disease), Cataract, Coronary artery spasm ([place]), Diverticulosis of colon (without mention of hemorrhage), GERD (gastroesophageal reflux disease), hepatic cyst, Hip fracture, right ([place]), History of cardiac catheterization, History of nuclear stress test (04/2011), HTN (hypertension), Hyperlipidemia, Insomnia, Irritable bowel syndrome, Transverse myelitis ([place]), Unstable angina ([place]), and Vitamin D deficiency.   She has a past surgical history that includes Resection soft tissue tumor leg / ankle radical (2004); Diagnostic laparoscopy; Lysis of adhesion; Salpingoophorectomy (Left); Pelvic laparoscopy (1992); Cardiac catheterization; transthoracic echocardiogram (07/2012); Cardiac catheterization (N/A, 08/28/2014); Colonoscopy; Upper gastrointestinal endoscopy; Total hip arthroplasty (Right, 10/04/2015); and Abdominal hysterectomy (Bilateral).   Her family history  includes Bladder Cancer in her mother; Coronary artery disease in her unknown relative; Depression in her sister; Diabetes in her brother and unknown relative; Heart disease in her father and mother; Hyperlipidemia in her sister; Hypertension in her brother and mother; Seizures in her brother; Stroke in her father.She reports that she quit smoking about 44 years ago. Her smoking use included cigarettes. She started smoking about 48 years ago. She has a 2.00 pack-year smoking history. She has never used smokeless tobacco. She reports that she does not drink alcohol or use drugs.  Current Outpatient Medications on File Prior to Visit  Medication Sig Dispense Refill  . aspirin 81 MG tablet Take 1 tablet (81 mg total) by mouth 2 (two) times daily with a meal. 30 tablet   . Calcium Citrate-Vitamin D (CALCIUM CITRATE + D3) 315-250 MG-UNIT TABS Take 2 tablets by mouth daily. 120 tablet   . cyclobenzaprine (FLEXERIL) 10 MG tablet Take 1 tablet (10 mg total) by mouth 3 (three) times daily as needed for muscle spasms. 60 tablet 0  . ferrous sulfate 325 (65 FE) MG tablet TAKE ONE TABLET BY MOUTH DAILY WITH BREAKFAST 30 tablet 2  . isosorbide mononitrate (IMDUR) 60 MG 24 hr tablet Take 1 tablet (60 mg total) by mouth daily. 90 tablet 3  . loperamide (IMODIUM) 2 MG capsule TAKE ONE CAPSULE BY MOUTH FOUR TIMES DAILY AS NEEDED FOR DIARRHEA / LOOSE STOOLS 120 capsule 3  . verapamil (CALAN-SR) 120 MG CR tablet Take 1 tablet (120 mg total) by mouth at bedtime. 90 tablet 3   No current facility-administered medications on file prior to visit.     ROS Review of Systems  Constitutional: Negative.   HENT: Negative for congestion.   Eyes: Negative for visual disturbance.  Respiratory: Negative for shortness of breath.   Cardiovascular:  Negative for chest pain.  Gastrointestinal: Negative for abdominal pain, constipation, diarrhea, nausea and vomiting.  Genitourinary: Negative for difficulty urinating.    Musculoskeletal: Negative for arthralgias and myalgias.  Neurological: Negative for headaches.  Psychiatric/Behavioral: Negative for sleep disturbance.    Objective:  BP 137/78   Pulse (!) 59   Ht 5\' 3"  (1.6 m)   Wt 161 lb (73 kg)   BMI 28.52 kg/m   BP Readings from Last 3 Encounters:  05/14/17 137/78  03/14/17 123/78  03/01/17 132/73    Wt Readings from Last 3 Encounters:  05/14/17 161 lb (73 kg)  03/14/17 160 lb (72.6 kg)  03/01/17 160 lb (72.6 kg)     Physical Exam  Constitutional: She is oriented to person, place, and time. She appears well-developed and well-nourished. No distress.  HENT:  Head: Normocephalic and atraumatic.  Right Ear: External ear normal.  Left Ear: External ear normal.  Nose: Nose normal.  Mouth/Throat: Oropharynx is clear and moist.  Eyes: Pupils are equal, round, and reactive to light. Conjunctivae and EOM are normal.  Neck: Normal range of motion. Neck supple. No thyromegaly present.  Cardiovascular: Normal rate, regular rhythm and normal heart sounds.  No murmur heard. Pulmonary/Chest: Effort normal and breath sounds normal. No respiratory distress. She has no wheezes. She has no rales.  Abdominal: Soft. Bowel sounds are normal. She exhibits no distension. There is no tenderness.  Lymphadenopathy:    She has no cervical adenopathy.  Neurological: She is alert and oriented to person, place, and time. She has normal reflexes.  Skin: Skin is warm and dry.  Psychiatric: She has a normal mood and affect. Her behavior is normal. Judgment and thought content normal.      Assessment & Plan:   Amy Black was seen today for medication refill.  Diagnoses and all orders for this visit:  Essential hypertension  Arthritis of neck  Lumbar myelopathy (HCC) -     ToxASSURE Select 13 (MW), Urine  Anxiety -     ToxASSURE Select 13 (MW), Urine  Other orders -     ALPRAZolam (XANAX) 0.5 MG tablet; Take 1 tablet (0.5 mg total) by mouth 3 (three)  times daily. -     traMADol (ULTRAM) 50 MG tablet; TAKE ONE TABLET BY MOUTH EVERY TWELVE HOURS AS NEEDED. -     triamterene-hydrochlorothiazide (DYAZIDE) 37.5-25 MG capsule; TAKE ONE CAPSULE BY MOUTH 3 TIMES PER WEEK -     triamcinolone (KENALOG) 0.025 % ointment; Apply 1 application topically 2 (two) times daily. -     nitroGLYCERIN (NITROSTAT) 0.4 MG SL tablet; DISSOLVE ONE TABLET UNDER TONGUE EVERY 5 MINUTES UP TO 3 DOSES AS NEEDED FOR CHEST PAIN -     pantoprazole (PROTONIX) 40 MG tablet; Take 1 tablet (40 mg total) by mouth 2 (two) times daily. -     sertraline (ZOLOFT) 50 MG tablet; Take 1 tablet (50 mg total) by mouth at bedtime. For nerves   Allergies as of 05/14/2017      Reactions   Iodine Itching   Amlodipine Other (See Comments)   headaches   Cymbalta [duloxetine Hcl] Other (See Comments)   sleepiness   Gabapentin Other (See Comments)   sleepiness   Ivp Dye [iodinated Diagnostic Agents] Itching   Zanaflex [tizanidine Hcl] Other (See Comments)   Very drunk feeling next day      Medication List        Accurate as of 05/14/17  7:04 PM. Always use your most recent  med list.          ALPRAZolam 0.5 MG tablet Commonly known as:  XANAX Take 1 tablet (0.5 mg total) by mouth 3 (three) times daily.   aspirin 81 MG tablet Take 1 tablet (81 mg total) by mouth 2 (two) times daily with a meal.   Calcium Citrate-Vitamin D 315-250 MG-UNIT Tabs Commonly known as:  CALCIUM CITRATE + D3 Take 2 tablets by mouth daily.   cyclobenzaprine 10 MG tablet Commonly known as:  FLEXERIL Take 1 tablet (10 mg total) by mouth 3 (three) times daily as needed for muscle spasms.   ferrous sulfate 325 (65 FE) MG tablet TAKE ONE TABLET BY MOUTH DAILY WITH BREAKFAST   isosorbide mononitrate 60 MG 24 hr tablet Commonly known as:  IMDUR Take 1 tablet (60 mg total) by mouth daily.   loperamide 2 MG capsule Commonly known as:  IMODIUM TAKE ONE CAPSULE BY MOUTH FOUR TIMES DAILY AS NEEDED FOR  DIARRHEA / LOOSE STOOLS   nitroGLYCERIN 0.4 MG SL tablet Commonly known as:  NITROSTAT DISSOLVE ONE TABLET UNDER TONGUE EVERY 5 MINUTES UP TO 3 DOSES AS NEEDED FOR CHEST PAIN   pantoprazole 40 MG tablet Commonly known as:  PROTONIX Take 1 tablet (40 mg total) by mouth 2 (two) times daily.   sertraline 50 MG tablet Commonly known as:  ZOLOFT Take 1 tablet (50 mg total) by mouth at bedtime. For nerves   traMADol 50 MG tablet Commonly known as:  ULTRAM TAKE ONE TABLET BY MOUTH EVERY TWELVE HOURS AS NEEDED.   triamcinolone 0.025 % ointment Commonly known as:  KENALOG Apply 1 application topically 2 (two) times daily.   triamterene-hydrochlorothiazide 37.5-25 MG capsule Commonly known as:  DYAZIDE TAKE ONE CAPSULE BY MOUTH 3 TIMES PER WEEK   verapamil 120 MG CR tablet Commonly known as:  CALAN-SR Take 1 tablet (120 mg total) by mouth at bedtime.       Meds ordered this encounter  Medications  . ALPRAZolam (XANAX) 0.5 MG tablet    Sig: Take 1 tablet (0.5 mg total) by mouth 3 (three) times daily.    Dispense:  90 tablet    Refill:  5    This prescription was filled on 04/07/2017. Any refills authorized will be placed on file.  . traMADol (ULTRAM) 50 MG tablet    Sig: TAKE ONE TABLET BY MOUTH EVERY TWELVE HOURS AS NEEDED.    Dispense:  60 tablet    Refill:  5  . triamterene-hydrochlorothiazide (DYAZIDE) 37.5-25 MG capsule    Sig: TAKE ONE CAPSULE BY MOUTH 3 TIMES PER WEEK    Dispense:  30 capsule    Refill:  5    This prescription was filled on 10/25/2016. Any refills authorized will be placed on file.  . triamcinolone (KENALOG) 0.025 % ointment    Sig: Apply 1 application topically 2 (two) times daily.    Dispense:  30 g    Refill:  0  . nitroGLYCERIN (NITROSTAT) 0.4 MG SL tablet    Sig: DISSOLVE ONE TABLET UNDER TONGUE EVERY 5 MINUTES UP TO 3 DOSES AS NEEDED FOR CHEST PAIN    Dispense:  25 tablet    Refill:  5  . pantoprazole (PROTONIX) 40 MG tablet    Sig: Take 1  tablet (40 mg total) by mouth 2 (two) times daily.    Dispense:  180 tablet    Refill:  2  . sertraline (ZOLOFT) 50 MG tablet    Sig: Take 1  tablet (50 mg total) by mouth at bedtime. For nerves    Dispense:  30 tablet    Refill:  5      Follow-up: Return in about 6 months (around 11/13/2017).  [name], M.D.

## 2017-05-18 DIAGNOSIS — H20011 Primary iridocyclitis, right eye: Secondary | ICD-10-CM | POA: Diagnosis not present

## 2017-05-18 DIAGNOSIS — M7061 Trochanteric bursitis, right hip: Secondary | ICD-10-CM | POA: Diagnosis not present

## 2017-05-19 LAB — TOXASSURE SELECT 13 (MW), URINE

## 2017-05-24 ENCOUNTER — Telehealth: Payer: Self-pay | Admitting: Family Medicine

## 2017-05-24 NOTE — Telephone Encounter (Signed)
Please contact the patient have her start taking 2 pills at bedtime, we started with a low dose.

## 2017-05-24 NOTE — Telephone Encounter (Signed)
DC med. Set up appt.

## 2017-05-24 NOTE — Telephone Encounter (Signed)
Patient aware and appt made 

## 2017-05-24 NOTE — Telephone Encounter (Signed)
Pt states she can not take it. It makes her feel like she is drunk with it and she takes them at night wants something completely different called in. Please advise.

## 2017-05-28 ENCOUNTER — Telehealth: Payer: Self-pay | Admitting: Family Medicine

## 2017-05-28 NOTE — Telephone Encounter (Signed)
Discussed med with pt, it was an automatice refill

## 2017-06-01 ENCOUNTER — Ambulatory Visit: Payer: Medicare Other | Admitting: Family Medicine

## 2017-06-04 ENCOUNTER — Telehealth: Payer: Self-pay | Admitting: Gastroenterology

## 2017-06-04 ENCOUNTER — Emergency Department (HOSPITAL_COMMUNITY)
Admission: EM | Admit: 2017-06-04 | Discharge: 2017-06-04 | Disposition: A | Discharge status: Home / Self Care | Payer: Medicare Other | Attending: Emergency Medicine | Admitting: Emergency Medicine

## 2017-06-04 ENCOUNTER — Encounter (HOSPITAL_COMMUNITY): Payer: Self-pay | Admitting: *Deleted

## 2017-06-04 ENCOUNTER — Ambulatory Visit: Payer: Medicare Other | Admitting: Nurse Practitioner

## 2017-06-04 DIAGNOSIS — K6289 Other specified diseases of anus and rectum: Secondary | ICD-10-CM | POA: Insufficient documentation

## 2017-06-04 DIAGNOSIS — Z5321 Procedure and treatment not carried out due to patient leaving prior to being seen by health care provider: Secondary | ICD-10-CM | POA: Diagnosis not present

## 2017-06-04 NOTE — ED Notes (Signed)
Pt did not want to wait any longer to see a MD.

## 2017-06-04 NOTE — ED Triage Notes (Signed)
Pt complains of yellow drainage and foul odor from her anus for the past 2 days. Pt states she has not had bowel movement for the past few days. Pt states she now has pain in her anus from having to wipe frequently.

## 2017-06-04 NOTE — Telephone Encounter (Signed)
Spoke to patient, she is having yellowish rectal drainage every time she uses the bathroom. She states has an odor, she has not been on any antibiotics. She did not try any imodium. Patient was scheduled to see Dr. Loletha Carrow on 5/30 but did not want to wait that long. Was available appointment today at 10:30, she felt she could get here in time. Next available APP appointment was not until 06/15/17. Scheduled for today with Colletta [place].

## 2017-06-05 ENCOUNTER — Ambulatory Visit: Payer: Medicare Other | Admitting: Student

## 2017-06-05 ENCOUNTER — Encounter: Payer: Self-pay | Admitting: Family Medicine

## 2017-06-05 ENCOUNTER — Telehealth: Payer: Self-pay | Admitting: Family Medicine

## 2017-06-05 ENCOUNTER — Ambulatory Visit (INDEPENDENT_AMBULATORY_CARE_PROVIDER_SITE_OTHER): Payer: Medicare Other | Admitting: Family Medicine

## 2017-06-05 VITALS — BP 130/72 | HR 71 | Temp 97.0°F | Ht 63.0 in | Wt 160.0 lb

## 2017-06-05 DIAGNOSIS — R3915 Urgency of urination: Secondary | ICD-10-CM | POA: Diagnosis not present

## 2017-06-05 DIAGNOSIS — R198 Other specified symptoms and signs involving the digestive system and abdomen: Secondary | ICD-10-CM

## 2017-06-05 DIAGNOSIS — R109 Unspecified abdominal pain: Secondary | ICD-10-CM | POA: Diagnosis not present

## 2017-06-05 LAB — URINALYSIS, COMPLETE
Bilirubin, UA: NEGATIVE
Glucose, UA: NEGATIVE
Ketones, UA: NEGATIVE
Nitrite, UA: NEGATIVE
Protein, UA: NEGATIVE
RBC, UA: NEGATIVE
Specific Gravity, UA: 1.01 (ref 1.005–1.030)
Urobilinogen, Ur: 0.2 mg/dL (ref 0.2–1.0)
pH, UA: 6.5 (ref 5.0–7.5)

## 2017-06-05 LAB — MICROSCOPIC EXAMINATION: Renal Epithel, UA: NONE SEEN /hpf

## 2017-06-05 NOTE — Telephone Encounter (Signed)
Amy Black it looks like Dr Darnell Level saw this pt today, do I need to put another referral in for her to see another GI or?

## 2017-06-05 NOTE — Telephone Encounter (Signed)
I know I talked to her. Do you know any other GI not associated with [facility] that we can send her to?

## 2017-06-05 NOTE — Telephone Encounter (Signed)
Yes [facility]

## 2017-06-05 NOTE — Telephone Encounter (Signed)
Do I need to put in another referral for GI? If not can you set up with [facility] please.

## 2017-06-05 NOTE — Telephone Encounter (Signed)
So she needs a GI that isn't associated with [facility] because she is no longer a pt with [facility]. She needs a GI somewhere.

## 2017-06-05 NOTE — Telephone Encounter (Signed)
The message is talking about a GI Doctor and the patient states she wants to see Dr Junious Silk but Dr Junious Silk is a urologist????

## 2017-06-05 NOTE — Telephone Encounter (Signed)
There are 2 phone calls regarding this. Will close this encounter, see other call.

## 2017-06-05 NOTE — Telephone Encounter (Signed)
ok 

## 2017-06-05 NOTE — Patient Instructions (Addendum)
I will contact you with the results of your urine culture.  We will plan for antibiotics pending this result. I have placed a referral to a new Gastroenterologist per your request.  Fecal Incontinence Fecal incontinence, also called accidental bowel leakage, is not being able to control your bowels. This condition happens because the nerves or muscles around the anus do not work the way they should. This affects their ability to hold stool. What are the causes? This condition may be caused by:  Damage to the muscles at the end of the rectum (sphincter).  Damage to the nerves that control bowel movements.  Diarrhea.  Chronic constipation.  Pelvic floor dysfunction. This means the muscles in the pelvis do not work well.  Loss of bowel storage capacity.  What increases the risk? This condition is more likely to develop in people who:  Are born with bowels or a pelvis that has not formed correctly.  Have had rectal surgery.  Have had radiation treatment for certain cancers.  Have irritable bowel syndrome (IBS).  Have an inflammatory bowel disease (IBD), such as Crohn disease.  Have been pregnant, had a vaginal delivery, or had surgery that damaged the pelvic floor muscles.  Have a complicated childbirth, spinal cord injury, or other trauma that causes nerve damage.  Have a condition that can affect nerve function, such as diabetes, Parkinson disease, or multiple sclerosis.  Have a condition where the rectum drops down into the anus or vagina (prolapse).  Are older.  What are the signs or symptoms? The main symptom of this condition is not being able to control your bowels. You also might not be able get to the bathroom before a bowel movement. How is this diagnosed? This condition is diagnosed with a medical history and physical exam. You may also have tests, including:  Blood tests.  Urine tests.  A rectal exam.  Ultrasound.  MRI.  Colonoscopy. This is an exam  that looks at your large intestine (colon).  Anal manometry. This is a test that measures the strength of the anal sphincter.  Anal electromyogram (EMG). This is a test that uses small electrodes to check for nerve damage.  How is this treated? Treatment varies depending on the cause and severity of your condition. Treatment may also focus on addressing any underlying causes of this condition. Treatment may include:  Medicines. This may include medicines to: ? Prevent diarrhea. ? Help with constipation (laxatives). ? Treat any underlying conditions.  Physical therapy.  Fiber supplements. These can help manage your bowel movements.  Nerve stimulation.  Injectable gel to promote tissue growth and better muscle control.  Surgery. You may need: ? Sphincter repair surgery. ? Diversion surgery. This procedure lets feces pass out of your body through a hole in your abdomen.  Follow these instructions at home: Diet  Follow instructions from your health care provider about any eating or drinking restrictions. Work with a dietitian to come up with a healthy diet and to help you avoid the foods that can make your condition worse. Keep a diet diary to find out which foods or drinks could be making your fecal incontinence worse.  Drink enough fluid to keep your urine clear or pale yellow. Lifestyle  If you smoke, talk to your health care provider about quitting. This may help your condition.  If you are overweight, talk to your health care provider about how to safely lose weight. This may help your condition.  Increase your physical activity as told  by your health care provider. This may help your condition. Always talk to your health care provider before starting a new exercise program.  Carry a change of clothes and supplies to clean up quickly if you have an episode of fetal incontinence.  Consider joining a fecal incontinence support group. You can find a support group online or in  your local community. General instructions  Take over-the-counter and prescription medicines only as told by your health care provider. This includes any supplements.  Apply a moisture barrier, such as petroleum jelly, to your rectum. This protects the skin from irritation caused by ongoing leaking or diarrhea.  Tell your health care provider if you are upset or depressed about your condition. Where to find more information: American Academy [employer]: www.AromatherapyParty.no International Foundation for Functional Gastrointestinal Disorders: www.iffgd.org Contact a health care provider if:  You have a fever.  You have redness, swelling, or pain around your rectum.  Your pain is getting worse or you lose feeling in your rectal area.  You have blood in your stool.  You feel sad or hopeless.  You avoid social or work situations. Get help right away if:  You stop having bowel movements.  You cannot eat or drink without vomiting.  You have rectal bleeding that does not stop.  You have severe pain that is getting worse.  You have symptoms of dehydration, including: ? Sleepiness or fatigue. ? Producing little or no urine, tears, or sweat. ? Dizziness. ? Dry mouth. ? Unusual irritability. ? Headache. ? Inability to think clearly. This information is not intended to replace advice given to you by your health care provider. Make sure you discuss any questions you have with your health care provider. Document Released: 01/05/2004 Document Revised: 07/01/2015 Document Reviewed: 07/01/2014 Elsevier Interactive Patient Education  [employer].

## 2017-06-05 NOTE — Progress Notes (Signed)
Subjective: CC: rectal discharge PCP: [name], MD [id number] Amy Black is a 77 y.o. female presenting to clinic today for:  1. Rectal discharge Patient reports that she has been having yellow discharge from the rectum.  She actually tried to see her gastroenterologist yesterday but they did not have an appointment.  She is scheduled to see 1 of the PAs on 06/15/2017.  She reports she would actually like to see a new gastroenterologist.  She notes that the anal leakage tends to happen only in the middle the night around 2 AM when she is sleeping.  She has no anal leakage during the day.  She is having normal bowel movements throughout the day.  Denies any nausea, vomiting, diarrhea, constipation.  She has chronic bloating due to her underlying IBS.  She has not eaten anything that is greasy or unusual.  She notes she did not have the symptoms this morning.  Past medical history is significant for transverse myelitis.  2. Urinary symptoms Patient reports a 3 day h/o intermittent urinary urgency and low back pressure .  Denies urinary frequency, hematuria, fevers, chills, abdominal pain, nausea, vomiting, vaginal discharge.  Patient has used nothing for symptoms.  Patient denies a h/o frequent or recurrent UTIs.      ROS: Per HPI  Allergies  Allergen Reactions  . Iodine Itching  . Amlodipine Other (See Comments)    headaches  . Cymbalta [Duloxetine Hcl] Other (See Comments)    sleepiness  . Gabapentin Other (See Comments)    sleepiness  . Ivp Dye [Iodinated Diagnostic Agents] Itching  . Zanaflex [Tizanidine Hcl] Other (See Comments)    Very drunk feeling next day   Past Medical History:  Diagnosis Date  . Adenocarcinoma (HCC)    LEFT LEG  . Adenocarcinoma of unknown primary ([place]) 02/06/2011  . 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 Allergy   . Anxiety   . CAD (coronary artery disease)    a. minimal CAD by cath in 2016 with presumed spasm  .  Cataract    bilateral cataracts removed  . Coronary artery spasm ([place])   . Diverticulosis of colon (without mention of hemorrhage)   . GERD (gastroesophageal reflux disease)   . hepatic cyst   . Hip fracture, right ([place])   . History of cardiac catheterization   . History of nuclear stress test 04/2011   lexiscan; normal study, no significant ischemia, low risk   . HTN (hypertension)    PT. DENIES  . Hyperlipidemia   . Insomnia   . Irritable bowel syndrome   . Transverse myelitis ([place])   . Unstable angina ([place])   . Vitamin D deficiency     Current Outpatient Medications:  .  ALPRAZolam (XANAX) 0.5 MG tablet, Take 1 tablet (0.5 mg total) by mouth 3 (three) times daily., Disp: 90 tablet, Rfl: 5 .  aspirin 81 MG tablet, Take 1 tablet (81 mg total) by mouth 2 (two) times daily with a meal., Disp: 30 tablet, Rfl:  .  Calcium Citrate-Vitamin D (CALCIUM CITRATE + D3) 315-250 MG-UNIT TABS, Take 2 tablets by mouth daily., Disp: 120 tablet, Rfl:  .  cyclobenzaprine (FLEXERIL) 10 MG tablet, Take 1 tablet (10 mg total) by mouth 3 (three) times daily as needed for muscle spasms., Disp: 60 tablet, Rfl: 0 .  ferrous sulfate 325 (65 FE) MG tablet, TAKE ONE TABLET BY MOUTH DAILY WITH BREAKFAST, Disp: 30 tablet, Rfl: 2 .  isosorbide mononitrate (IMDUR) 60 MG 24 hr tablet, Take 1 tablet (60 mg total) by mouth daily., Disp: 90 tablet, Rfl: 3 .  loperamide (IMODIUM) 2 MG capsule, TAKE ONE CAPSULE BY MOUTH FOUR TIMES DAILY AS NEEDED FOR DIARRHEA / LOOSE STOOLS, Disp: 120 capsule, Rfl: 3 .  nitroGLYCERIN (NITROSTAT) 0.4 MG SL tablet, DISSOLVE ONE TABLET UNDER TONGUE EVERY 5 MINUTES UP TO 3 DOSES AS NEEDED FOR CHEST PAIN, Disp: 25 tablet, Rfl: 5 .  pantoprazole (PROTONIX) 40 MG tablet, Take 1 tablet (40 mg total) by mouth 2 (two) times daily., Disp: 180 tablet, Rfl: 2 .  traMADol (ULTRAM) 50 MG tablet, TAKE ONE TABLET BY MOUTH EVERY TWELVE HOURS AS NEEDED., Disp: 60 tablet, Rfl: 5 .  triamcinolone (KENALOG)  0.025 % ointment, Apply 1 application topically 2 (two) times daily., Disp: 30 g, Rfl: 0 .  triamterene-hydrochlorothiazide (DYAZIDE) 37.5-25 MG capsule, TAKE ONE CAPSULE BY MOUTH 3 TIMES PER WEEK, Disp: 30 capsule, Rfl: 5 .  verapamil (CALAN-SR) 120 MG CR tablet, Take 1 tablet (120 mg total) by mouth at bedtime., Disp: 90 tablet, Rfl: 3 Social History   Socioeconomic History  . Marital status: Married    Spouse name: Not on file  . Number of children: 2  . Years of education: Not on file  . Highest education level: Not on file  Occupational History  . Occupation: Disabilty  . Occupation: DISABILITY    Employer: UNEMPLOYED  Social Needs  . Financial resource strain: Not on file  . Food insecurity:    Worry: Not on file    Inability: Not on file  . Transportation needs:    Medical: Not on file    Non-medical: Not on file  Tobacco Use  . Smoking status: Former Smoker    Packs/day: 0.50    Years: 4.00    Pack years: 2.00    Types: Cigarettes    Start date: 12/07/1968    Last attempt to quit: 11/20/1972    Years since quitting: 44.5  . Smokeless tobacco: Never Used  . Tobacco comment: quit 40 years ago  Substance and Sexual Activity  . Alcohol use: No    Alcohol/week: 0.0 oz  . Drug use: No  . Sexual activity: Never  Lifestyle  . Physical activity:    Days per week: Not on file    Minutes per session: Not on file  . Stress: Not on file  Relationships  . Social connections:    Talks on phone: Not on file    Gets together: Not on file    Attends religious service: Not on file    Active member of club or organization: Not on file    Attends meetings of clubs or organizations: Not on file    Relationship status: Not on file  . Intimate partner violence:    Fear of current or ex partner: Not on file    Emotionally abused: Not on file    Physically abused: Not on file    Forced sexual activity: Not on file  Other Topics Concern  . Not on file  Social History Narrative    . Not on file   Family History  Problem Relation Age of Onset  . Bladder Cancer Mother   . Heart disease Mother   . Hypertension Mother   . Heart disease Father   . Stroke Father   . Diabetes Unknown        Multiple family members on both sides   . Coronary  artery disease Unknown        Multiple family members on both sides   . Hyperlipidemia Sister   . Depression Sister   . Diabetes Brother   . Seizures Brother   . Hypertension Brother   . Colon cancer Neg Hx   . Esophageal cancer Neg Hx   . Pancreatic cancer Neg Hx   . Rectal cancer Neg Hx   . Stomach cancer Neg Hx     Objective: Office vital signs reviewed. BP 130/72   Pulse 71   Temp (!) 97 F (36.1 C) (Oral)   Ht 5\' 3"  (1.6 m)   Wt 160 lb (72.6 kg)   BMI 28.34 kg/m   Physical Examination:  General: Awake, alert, well nourished, No acute distress HEENT: sclera white, MMM Rectal: rectal tone slightly decreased.  No palpable abnormalities within the rectal vault. No external hemorrhoids or lesions. MSK; uses cane for ambulation Neuro: Alert and oriented.  Follows commands.  Assessment/ Plan: 77 y.o. female   1. Rectal discharge Patient with slightly decreased rectal tone on exam.  She has IBS and a history of transverse myelitis.  These may be contributing as well.  I recommended that she keep her appointment with gastroenterology as scheduled.  Anal leakage likely secondary to above medical issues.  Pelvic floor rehab may be beneficial.  Will await assessment by gastroenterology.  As per patient's request, I have placed a referral to a new gastroenterologist.  Follow-up with PCP as needed. - Ambulatory referral to Gastroenterology  2. Urinary urgency Intermittent in nature.  No other signs or symptoms of urinary tract infection.  Her urinalysis today with 6-10 white blood cells, and few bacteria.  We will send for culture.  If culture is positive, we will plan to initiate oral antibiotics.  Patient is agreeable  to this plan.  We will contact her with results once available. - Urinalysis, Complete - Urine Culture   Orders Placed This Encounter  Procedures  . Urine Culture  . Urinalysis, Complete  . Ambulatory referral to Gastroenterology    Referral Priority:   Routine    Referral Type:   Consultation    Referral Reason:   Specialty Services Required    Number of Visits Requested:   [place], [place] [phone]

## 2017-06-06 LAB — URINE CULTURE

## 2017-06-06 NOTE — Telephone Encounter (Signed)
I ll set up with [facility]

## 2017-06-13 ENCOUNTER — Encounter: Payer: Self-pay | Admitting: *Deleted

## 2017-06-13 ENCOUNTER — Ambulatory Visit (INDEPENDENT_AMBULATORY_CARE_PROVIDER_SITE_OTHER): Payer: Medicare Other | Admitting: *Deleted

## 2017-06-13 VITALS — BP 131/73 | HR 63 | Ht 63.0 in | Wt 161.0 lb

## 2017-06-13 DIAGNOSIS — Z0000 Encounter for general adult medical examination without abnormal findings: Secondary | ICD-10-CM

## 2017-06-13 NOTE — Patient Instructions (Signed)
Please work on your goal of reducing snacks throughout the day.   Please review the information given on Advanced Directives, and if you complete these please bring our office a copy to file in your chart.   Thank you for coming in for your Annual Wellness Visit today!   Preventive Care 75 Years and Older, Female Preventive care refers to lifestyle choices and visits with your health care provider that can promote health and wellness. What does preventive care include?  A yearly physical exam. This is also called an annual well check.  Dental exams once or twice a year.  Routine eye exams. Ask your health care provider how often you should have your eyes checked.  Personal lifestyle choices, including: ? Daily care of your teeth and gums. ? Regular physical activity. ? Eating a healthy diet. ? Avoiding tobacco and drug use. ? Limiting alcohol use. ? Practicing safe sex. ? Taking low-dose aspirin every day. ? Taking vitamin and mineral supplements as recommended by your health care provider. What happens during an annual well check? The services and screenings done by your health care provider during your annual well check will depend on your age, overall health, lifestyle risk factors, and family history of disease. Counseling Your health care provider may ask you questions about your:  Alcohol use.  Tobacco use.  Drug use.  Emotional well-being.  Home and relationship well-being.  Sexual activity.  Eating habits.  History of falls.  Memory and ability to understand (cognition).  Work and work Statistician.  Reproductive health.  Screening You may have the following tests or measurements:  Height, weight, and BMI.  Blood pressure.  Lipid and cholesterol levels. These may be checked every 5 years, or more frequently if you are over 75 years old.  Skin check.  Lung cancer screening. You may have this screening every year starting at age 75 if you have a  30-pack-year history of smoking and currently smoke or have quit within the past 15 years.  Fecal occult blood test (FOBT) of the stool. You may have this test every year starting at age 75.  Flexible sigmoidoscopy or colonoscopy. You may have a sigmoidoscopy every 5 years or a colonoscopy every 10 years starting at age 43.  Hepatitis C blood test.  Hepatitis B blood test.  Sexually transmitted disease (STD) testing.  Diabetes screening. This is done by checking your blood sugar (glucose) after you have not eaten for a while (fasting). You may have this done every 1-3 years.  Bone density scan. This is done to screen for osteoporosis. You may have this done starting at age 75.  Mammogram. This may be done every 1-2 years. Talk to your health care provider about how often you should have regular mammograms.  Talk with your health care provider about your test results, treatment options, and if necessary, the need for more tests. Vaccines Your health care provider may recommend certain vaccines, such as:  Influenza vaccine. This is recommended every year.  Tetanus, diphtheria, and acellular pertussis (Tdap, Td) vaccine. You may need a Td booster every 10 years.  Varicella vaccine. You may need this if you have not been vaccinated.  Zoster vaccine. You may need this after age 75.  Measles, mumps, and rubella (MMR) vaccine. You may need at least one dose of MMR if you were born in 1957 or later. You may also need a second dose.  Pneumococcal 13-valent conjugate (PCV13) vaccine. One dose is recommended after age 75.  Pneumococcal polysaccharide (PPSV23) vaccine. One dose is recommended after age 75.  Meningococcal vaccine. You may need this if you have certain conditions.  Hepatitis A vaccine. You may need this if you have certain conditions or if you travel or work in places where you may be exposed to hepatitis A.  Hepatitis B vaccine. You may need this if you have certain  conditions or if you travel or work in places where you may be exposed to hepatitis B.  Haemophilus influenzae type b (Hib) vaccine. You may need this if you have certain conditions.  Talk to your health care provider about which screenings and vaccines you need and how often you need them. This information is not intended to replace advice given to you by your health care provider. Make sure you discuss any questions you have with your health care provider. Document Released: 02/19/2015 Document Revised: 10/13/2015 Document Reviewed: 11/24/2014 Elsevier Interactive Patient Education  [employer].

## 2017-06-14 NOTE — Progress Notes (Signed)
Subjective:   Amy Black is a 77 y.o. female who presents for Medicare Annual (Subsequent) preventive examination.  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  She enjoys reading and doing crossword puzzles.  She is involved in her church. Amy Black has 2 children and one grandson.   She lives with her husband, daughter, and grandson.  She feels her health is better this year than last.  She reports no hospitalizations, ER visits, or surgeries in the past year.   Review of Systems:  All negative today       Objective:     Vitals: BP 131/73   Pulse 63   Ht 5\' 3"  (1.6 m)   Wt 161 lb (73 kg)   BMI 28.52 kg/m   Body mass index is 28.52 kg/m.  Advanced Directives 06/14/2017 06/13/2017 07/28/2016 01/28/2016 12/07/2015 10/03/2015 07/23/2015  Does Patient Have a Medical Advance Directive? - No No No No No No  Would patient like information on creating a medical advance directive? Yes (MAU/Ambulatory/Procedural Areas - Information given) - - - Yes - Educational materials given - No - patient declined information  Pre-existing out of facility DNR order (yellow form or pink MOST form) - - - - - - -    Tobacco Social History   Tobacco Use  Smoking Status Former Smoker  . Packs/day: 0.50  . Years: 4.00  . Pack years: 2.00  . Types: Cigarettes  . Start date: 12/07/1968  . Last attempt to quit: 11/20/1972  . Years since quitting: 44.5  Smokeless Tobacco Never Used  Tobacco Comment   quit 40 years ago     Counseling given: No Comment: quit 40 years ago   Clinical Intake:     Pain Score: 0-No pain                 Past Medical History:  Diagnosis Date  . Adenocarcinoma (HCC)    LEFT LEG  . Adenocarcinoma of unknown primary ([place]) 02/06/2011  . 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 Allergy   . Anxiety   . CAD (coronary artery disease)    a. minimal CAD by cath in 2016 with presumed spasm  . Cataract     bilateral cataracts removed  . Coronary artery spasm ([place])   . Diverticulosis of colon (without mention of hemorrhage)   . GERD (gastroesophageal reflux disease)   . hepatic cyst   . Hip fracture, right ([place])   . History of cardiac catheterization   . History of nuclear stress test 04/2011   lexiscan; normal study, no significant ischemia, low risk   . HTN (hypertension)    PT. DENIES  . Hyperlipidemia   . Insomnia   . Irritable bowel syndrome   . Transverse myelitis ([place])   . Unstable angina ([place])   . Vitamin D deficiency    Past Surgical History:  Procedure Laterality Date  . ABDOMINAL HYSTERECTOMY Bilateral   . CARDIAC CATHETERIZATION     coronary spasm  . CARDIAC CATHETERIZATION N/A 08/28/2014   Procedure: Left Heart Cath and Coronary Angiography;  Surgeon: Jettie Booze, MD;  Location: [facility];  Service: Cardiovascular;  Laterality: N/A;  . COLONOSCOPY    . DIAGNOSTIC LAPAROSCOPY    . LYSIS OF ADHESION    . PELVIC LAPAROSCOPY  1992   RSO, AND LSO ON 2007  . RESECTION SOFT TISSUE TUMOR LEG / ANKLE RADICAL  2004  leg lesion resection & radiation  . SALPINGOOPHORECTOMY Left   . TOTAL HIP ARTHROPLASTY Right 10/04/2015   Procedure: TOTAL HIP ARTHROPLASTY ANTERIOR APPROACH;  Surgeon: Rod Can, MD;  Location: [facility];  Service: Orthopedics;  Laterality: Right;  . TRANSTHORACIC ECHOCARDIOGRAM  07/2012   LV cavity size mildly reduced, normal wall motion; MV with calcified annulus and mild MR; LA mildly dilated; atrial septum with increased thickness - lipomatous hypertrophy; RV systolic pressure increased (borderline pulm HTN)  . UPPER GASTROINTESTINAL ENDOSCOPY     Family History  Problem Relation Age of Onset  . Bladder Cancer Mother   . Heart disease Mother   . Hypertension Mother   . Heart disease Father   . Stroke Father   . Diabetes Unknown        Multiple family members on both sides   . Coronary artery disease Unknown        Multiple family  members on both sides   . Hyperlipidemia Sister   . Depression Sister   . Diabetes Brother   . Seizures Brother   . Hypertension Brother   . Colon cancer Neg Hx   . Esophageal cancer Neg Hx   . Pancreatic cancer Neg Hx   . Rectal cancer Neg Hx   . Stomach cancer Neg Hx    Social History   Socioeconomic History  . Marital status: Married    Spouse name: Not on file  . Number of children: 2  . Years of education: Not on file  . Highest education level: Not on file  Occupational History  . Occupation: Disabilty  . Occupation: DISABILITY    Employer: UNEMPLOYED  Social Needs  . Financial resource strain: Not on file  . Food insecurity:    Worry: Not on file    Inability: Not on file  . Transportation needs:    Medical: Not on file    Non-medical: Not on file  Tobacco Use  . Smoking status: Former Smoker    Packs/day: 0.50    Years: 4.00    Pack years: 2.00    Types: Cigarettes    Start date: 12/07/1968    Last attempt to quit: 11/20/1972    Years since quitting: 44.5  . Smokeless tobacco: Never Used  . Tobacco comment: quit 40 years ago  Substance and Sexual Activity  . Alcohol use: No    Alcohol/week: 0.0 oz  . Drug use: No  . Sexual activity: Never  Lifestyle  . Physical activity:    Days per week: Not on file    Minutes per session: Not on file  . Stress: Not on file  Relationships  . Social connections:    Talks on phone: Not on file    Gets together: Not on file    Attends religious service: Not on file    Active member of club or organization: Not on file    Attends meetings of clubs or organizations: Not on file    Relationship status: Not on file  Other Topics Concern  . Not on file  Social History Narrative  . Not on file    Outpatient Encounter Medications as of 06/13/2017  Medication Sig  . ALPRAZolam (XANAX) 0.5 MG tablet Take 1 tablet (0.5 mg total) by mouth 3 (three) times daily.  Marland Kitchen aspirin 81 MG tablet Take 1 tablet (81 mg total) by mouth  2 (two) times daily with a meal. (Patient taking differently: Take 81 mg by mouth daily. )  . Calcium  Citrate-Vitamin D (CALCIUM CITRATE + D3) 315-250 MG-UNIT TABS Take 2 tablets by mouth daily.  . cyclobenzaprine (FLEXERIL) 10 MG tablet Take 1 tablet (10 mg total) by mouth 3 (three) times daily as needed for muscle spasms.  . ferrous sulfate 325 (65 FE) MG tablet TAKE ONE TABLET BY MOUTH DAILY WITH BREAKFAST  . isosorbide mononitrate (IMDUR) 60 MG 24 hr tablet Take 1 tablet (60 mg total) by mouth daily.  Marland Kitchen loperamide (IMODIUM) 2 MG capsule TAKE ONE CAPSULE BY MOUTH FOUR TIMES DAILY AS NEEDED FOR DIARRHEA / LOOSE STOOLS  . nitroGLYCERIN (NITROSTAT) 0.4 MG SL tablet DISSOLVE ONE TABLET UNDER TONGUE EVERY 5 MINUTES UP TO 3 DOSES AS NEEDED FOR CHEST PAIN  . pantoprazole (PROTONIX) 40 MG tablet Take 1 tablet (40 mg total) by mouth 2 (two) times daily.  . traMADol (ULTRAM) 50 MG tablet TAKE ONE TABLET BY MOUTH EVERY TWELVE HOURS AS NEEDED.  Marland Kitchen triamcinolone (KENALOG) 0.025 % ointment Apply 1 application topically 2 (two) times daily. (Patient taking differently: Apply 1 application topically 2 (two) times daily as needed. )  . triamterene-hydrochlorothiazide (DYAZIDE) 37.5-25 MG capsule TAKE ONE CAPSULE BY MOUTH 3 TIMES PER WEEK  . verapamil (CALAN-SR) 120 MG CR tablet Take 1 tablet (120 mg total) by mouth at bedtime.   No facility-administered encounter medications on file as of 06/13/2017.     Activities of Daily Living In your present state of health, do you have any difficulty performing the following activities: 06/13/2017  Hearing? N  Vision? N  Difficulty concentrating or making decisions? N  Walking or climbing stairs? Y  Comment impaired balance on right side of body  Dressing or bathing? N  Doing errands, shopping? N  Preparing Food and eating ? N  Using the Toilet? N  In the past six months, have you accidently leaked urine? N  Do you have problems with loss of bowel control? N    Managing your Medications? N  Managing your Finances? N  Housekeeping or managing your Housekeeping? Y  Comment Daughter helps with house work   Some recent data might be hidden    Patient Care Team: [name], MD as PCP - General (Family Medicine) Lonna Cobb, PT (Inactive) as Physical Therapist (Physical Therapy) Minus Breeding, MD as Consulting Physician (Cardiology) [place], Holli Humbles, NP as Nurse Practitioner (Nurse Practitioner) Doran Stabler, MD as Consulting Physician (Gastroenterology) Rod Can, MD as Consulting Physician (Orthopedic Surgery) Volanda Napoleon, MD as Consulting Physician (Oncology)    Assessment:   This is a routine wellness examination for [place].  Exercise Activities and Dietary recommendations Current Exercise Habits: Home exercise routine, Type of exercise: strength training/weights;Other - see comments(rides stationary bicycle), Time (Minutes): 60, Frequency (Times/Week): 3, Weekly Exercise (Minutes/Week): 180, Intensity: Mild  Patient states she usually eats 2-3 meals per day containing plenty of fruits, vegetables, and lean proteins.  She snacks throughout the day, and has set a goal of decreasing snacks because she would like to lose weight.    Goals  Decrease snacks and extra servings throughout the day to help with weight loss   Fall Risk Fall Risk  06/14/2017 06/05/2017 03/01/2017 02/01/2017 12/12/2016  Falls in the past year? No No No No No  Number falls in past yr: - - - - -  Injury with Fall? - - - - -  Risk Factor Category  - - - - -  Risk for fall due to : - - - - -  Follow  up - - - - -   Is the patient's home free of loose throw rugs in walkways, pet beds, electrical cords, etc?   yes      Grab bars in the bathroom? no      Handrails on the stairs?   yes      Adequate lighting?   yes    Depression Screen PHQ 2/9 Scores 06/14/2017 06/05/2017 03/01/2017 02/01/2017  PHQ - 2 Score 0 0 0 0     Cognitive  Function MMSE - Mini Mental State Exam 06/14/2017 12/07/2015  Orientation to time 5 5  Orientation to Place 5 5  Registration 3 3  Attention/ Calculation 3 3  Recall 3 3  Language- name 2 objects 2 2  Language- repeat 1 1  Language- follow 3 step command 3 3  Language- read & follow direction 1 1  Write a sentence 1 1  Copy design 1 1  Total score 28 28        Immunization History  Administered Date(s) Administered  . Pneumococcal Polysaccharide-23 02/07/2000  . Tdap 02/06/2002  . Zoster 02/06/2010  . Zoster Recombinat (Shingrix) 11/06/2016, 01/08/2017    Qualifies for Shingles Vaccine? yes, declined today  Screening Tests Health Maintenance  Topic Date Due  . PNA vac Low Risk Adult (1 of 2 - PCV13) 10/13/2007  . PAP SMEAR  11/28/2011  . TETANUS/TDAP  02/07/2012  . INFLUENZA VACCINE  09/06/2017  . DEXA SCAN  12/06/2017  . MAMMOGRAM  12/26/2017  . COLONOSCOPY  08/11/2020   tdap and prevnar 13 declined today        Plan:     Work on your goal of reducing snacks throughout the day.  Review the information given on Advanced Directives, and if you complete these please bring our office a copy to file in your chart.  Follow up with Dr. Livia Snellen as scheduled  I have personally reviewed and noted the following in the patient's chart:   . Medical and social history . Use of alcohol, tobacco or illicit drugs  . Current medications and supplements . Functional ability and status . Nutritional status . Physical activity . Advanced directives . List of other physicians . Hospitalizations, surgeries, and ER visits in previous 12 months . Vitals . Screenings to include cognitive, depression, and falls . Referrals and appointments  In addition, I have reviewed and discussed with patient certain preventive protocols, quality metrics, and best practice recommendations. A written personalized care plan for preventive services as well as general preventive health  recommendations were provided to patient.     Daichi Moris M, RN  06/14/2017   I have reviewed and agree with the above AWV documentation.  [name], M.D.

## 2017-06-20 ENCOUNTER — Encounter: Payer: Self-pay | Admitting: *Deleted

## 2017-06-20 DIAGNOSIS — R197 Diarrhea, unspecified: Secondary | ICD-10-CM | POA: Diagnosis not present

## 2017-06-20 NOTE — Progress Notes (Addendum)
Cardiology Office Note    Date:  06/21/2017  ID:  Amy Black, DOB [birth year], MRN [id number] PCP:  [name], MD  Cardiologist:  Dr. Percival Spanish  Chief Complaint:  3 month f/u med titration (Imdur)  History of Present Illness:  Amy Black is a 77 y.o. female with history of presumed coronary spasm, nonobstructive CAD, HTN, palpitations, transverse myelitis limiting mobility, diverticulosis, adenosquamous carcinoma L leg s/p radiation & resection, anxiety, GERD, hyperlipidemia, IBS, insomnia who presents for f/u of chest pain.  She has a history of chest pain and palpitations.  She has been on verapamil for greater than 25 years. Nuclear stress test in 2015 was normal. Cardiac cath 08/2014 showed minimal CAD with 20% mid LAD, 20% D2. She has had recurrent chest pain and has been treated with increased doses of Imdur. She previously had relief of her pain after being taken off of verapamil and started on Cardizem. However, at some point she was changed back to verapamil. When last seen in clinic 03/2014 she was reporting dull chest pain with sporadic pattern. EKG was nonacute. Imdur was titrated to '60mg'$  daily. Last labs 09/2016 showed Cr 1.0, K 5.6, alk phos 121, plt 476, Hgb 12.7, TSH wnl.  She returns for follow-up feeling well from a cardiac standpoint. Her atypical chest pain is much less frequently, only 3 episodes in 4 months' time. All of these episodes tend to happen when she is at rest doing nothing at all, described as a sharp pain that catches her off guard. The last few episodes have not gone away with NTG but instead a "pain pill" after about 17mn. It is not worse with inspiration, palpation or movement. It is not associated with any symptoms. She is exercising at the gym regularly and denies any exertional sx. She established care with GI yesterday due to some rectal discharge issues which are felt due to her transverse myelitis.   Past Medical History:  Diagnosis Date  .  Adenocarcinoma (HCC)    LEFT LEG  . Adenocarcinoma of unknown primary ([facility] 02/06/2011  . 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 Allergy   . Anxiety   . CAD (coronary artery disease)    a. minimal CAD by cath in 2016 with presumed spasm (20% mLAD, 20% D2).  . Cataract    bilateral cataracts removed  . Coronary artery spasm ([facility]   . Diverticulosis of colon (without mention of hemorrhage)   . GERD (gastroesophageal reflux disease)   . hepatic cyst   . Hip fracture, right ([place]   . History of cardiac catheterization   . History of nuclear stress test 04/2011   lexiscan; normal study, no significant ischemia, low risk; 2015 - normal  . HTN (hypertension)    PT. DENIES  . Hyperlipidemia   . Insomnia   . Irritable bowel syndrome   . Transverse myelitis ([facility]   . Vitamin D deficiency     Past Surgical History:  Procedure Laterality Date  . ABDOMINAL HYSTERECTOMY Bilateral   . CARDIAC CATHETERIZATION     coronary spasm  . CARDIAC CATHETERIZATION N/A 08/28/2014   Procedure: Left Heart Cath and Coronary Angiography;  Surgeon: JJettie Booze MD;  [facility];  Service: Cardiovascular;  Laterality: N/A;  . COLONOSCOPY    . DIAGNOSTIC LAPAROSCOPY    . LYSIS OF ADHESION    . PELVIC LAPAROSCOPY  1992   RSO, AND LSO ON 2007  .  RESECTION SOFT TISSUE TUMOR LEG / ANKLE RADICAL  2004   leg lesion resection & radiation  . SALPINGOOPHORECTOMY Left   . TOTAL HIP ARTHROPLASTY Right 10/04/2015   Procedure: TOTAL HIP ARTHROPLASTY ANTERIOR APPROACH;  Surgeon: Rod Can, MD;  Location: [facility];  Service: Orthopedics;  Laterality: Right;  . TRANSTHORACIC ECHOCARDIOGRAM  07/2012   LV cavity size mildly reduced, normal wall motion; MV with calcified annulus and mild MR; LA mildly dilated; atrial septum with increased thickness - lipomatous hypertrophy; RV systolic pressure increased (borderline pulm HTN)  . UPPER GASTROINTESTINAL ENDOSCOPY       Current Medications: Current Meds  Medication Sig  . ALPRAZolam (XANAX) 0.5 MG tablet Take 1 tablet (0.5 mg total) by mouth 3 (three) times daily.  Marland Kitchen aspirin 81 MG tablet Take 1 tablet (81 mg total) by mouth 2 (two) times daily with a meal. (Patient taking differently: Take 81 mg by mouth daily. )  . Calcium Citrate-Vitamin D (CALCIUM CITRATE + D3) 315-250 MG-UNIT TABS Take 2 tablets by mouth daily.  . cyclobenzaprine (FLEXERIL) 10 MG tablet Take 1 tablet (10 mg total) by mouth 3 (three) times daily as needed for muscle spasms.  . ferrous sulfate 325 (65 FE) MG tablet TAKE ONE TABLET BY MOUTH DAILY WITH BREAKFAST  . isosorbide mononitrate (IMDUR) 60 MG 24 hr tablet Take 1 tablet (60 mg total) by mouth daily.  Marland Kitchen loperamide (IMODIUM) 2 MG capsule TAKE ONE CAPSULE BY MOUTH FOUR TIMES DAILY AS NEEDED FOR DIARRHEA / LOOSE STOOLS  . nitroGLYCERIN (NITROSTAT) 0.4 MG SL tablet DISSOLVE ONE TABLET UNDER TONGUE EVERY 5 MINUTES UP TO 3 DOSES AS NEEDED FOR CHEST PAIN  . pantoprazole (PROTONIX) 40 MG tablet Take 1 tablet (40 mg total) by mouth 2 (two) times daily.  . traMADol (ULTRAM) 50 MG tablet TAKE ONE TABLET BY MOUTH EVERY TWELVE HOURS AS NEEDED.  Marland Kitchen triamcinolone (KENALOG) 0.025 % ointment Apply 1 application topically 2 (two) times daily. (Patient taking differently: Apply 1 application topically 2 (two) times daily as needed. )  . triamterene-hydrochlorothiazide (DYAZIDE) 37.5-25 MG capsule TAKE ONE CAPSULE BY MOUTH 3 TIMES PER WEEK  . verapamil (CALAN-SR) 120 MG CR tablet Take 1 tablet (120 mg total) by mouth at bedtime.    Allergies:   Iodine; Amlodipine; Cymbalta [duloxetine hcl]; Gabapentin; Ivp dye [iodinated diagnostic agents]; and Zanaflex [tizanidine hcl]   Social History   Socioeconomic History  . Marital status: Married    Spouse name: Not on file  . Number of children: 2  . Years of education: Not on file  . Highest education level: Not on file  Occupational History  .  Occupation: Disabilty  . Occupation: DISABILITY    Employer: UNEMPLOYED  Social Needs  . Financial resource strain: Not on file  . Food insecurity:    Worry: Not on file    Inability: Not on file  . Transportation needs:    Medical: Not on file    Non-medical: Not on file  Tobacco Use  . Smoking status: Former Smoker    Packs/day: 0.50    Years: 4.00    Pack years: 2.00    Types: Cigarettes    Start date: 12/07/1968    Last attempt to quit: 11/20/1972    Years since quitting: 44.6  . Smokeless tobacco: Never Used  . Tobacco comment: quit 40 years ago  Substance and Sexual Activity  . Alcohol use: No    Alcohol/week: 0.0 oz  . Drug use: No  .  Sexual activity: Never  Lifestyle  . Physical activity:    Days per week: Not on file    Minutes per session: Not on file  . Stress: Not on file  Relationships  . Social connections:    Talks on phone: Not on file    Gets together: Not on file    Attends religious service: Not on file    Active member of club or organization: Not on file    Attends meetings of clubs or organizations: Not on file    Relationship status: Not on file  Other Topics Concern  . Not on file  Social History Narrative  . Not on file     Family History:  Family History  Problem Relation Age of Onset  . Bladder Cancer Mother   . Heart disease Mother   . Hypertension Mother   . Heart disease Father   . Stroke Father   . Diabetes Unknown        Multiple family members on both sides   . Coronary artery disease Unknown        Multiple family members on both sides   . Hyperlipidemia Sister   . Depression Sister   . Diabetes Brother   . Seizures Brother   . Hypertension Brother   . Colon cancer Neg Hx   . Esophageal cancer Neg Hx   . Pancreatic cancer Neg Hx   . Rectal cancer Neg Hx   . Stomach cancer Neg Hx      ROS:   Please see the history of present illness.  All other systems are reviewed and otherwise negative.    PHYSICAL EXAM:    VS:  BP 134/80   Pulse 74   Ht '5\' 3"'$  (1.6 m)   Wt 160 lb (72.6 kg)   SpO2 97%   BMI 28.34 kg/m   BMI: Body mass index is 28.34 kg/m. GEN: Well nourished, well developed AAF, in no acute distress  HEENT: normocephalic, atraumatic Neck: no JVD, carotid bruits, or masses Cardiac: RRR; no rubs, or gallops, no edema . Very very soft SEM mid sternal which pt reports has been told she had for 8 years - nearly impossible to hear at times Respiratory:  clear to auscultation bilaterally, normal work of breathing GI: soft, nontender, nondistended, + BS MS: no deformity or atrophy  Skin: warm and dry, no rash Neuro:  Alert and Oriented x 3, Strength and sensation are intact, follows commands Psych: euthymic mood, full affect  Wt Readings from Last 3 Encounters:  06/21/17 160 lb (72.6 kg)  06/13/17 161 lb (73 kg)  06/05/17 160 lb (72.6 kg)      Studies/Labs Reviewed:   EKG:  EKG was ordered today and personally reviewed by me and demonstrates NSR no acute changes.  Recent Labs: 09/27/2016: ALT 10; BUN 18; Creatinine, Ser 1.00; Hemoglobin 12.7; Platelets 476; Potassium 5.6; Sodium 142; TSH 0.675   Lipid Panel    Component Value Date/Time   CHOL 213 (H) 04/20/2016 1430   CHOL 203 (H) 08/29/2012 1316   TRIG 92 04/20/2016 1430   TRIG 131 08/29/2012 1316   HDL 89 04/20/2016 1430   HDL 68 08/29/2012 1316   CHOLHDL 2.4 04/20/2016 1430   LDLCALC 106 (H) 04/20/2016 1430   LDLCALC 109 (H) 08/29/2012 1316    Additional studies/ records that were reviewed today include: Summarized above.    ASSESSMENT & PLAN:   1. Atypical chest pain with history of coronary vasospasm - the  true etiology of her chest pain is unclear. She carries a prior dx of presumed coronary vasospasm but there are also atypical components of her chest pain that sound neuropathic rather than cardiac. Esophageal spasm is also a consideration. Regardless, she is pleased with the minimal frequency of her symptoms so will  continue current regimen. She will notify our office of any increasing symptoms at which time it might be worthwhile having her gastroenterologist weigh in. 2. Mild CAD - as above. Continue current regimen.  3. HTN - BP initially elevated by nurse, recheck by me 130s/80s c/w prior. She did have a fall last year where she broke her hip so I would not aggressively lower BP in the setting of her chronic mobility issues. Continue to monitor on present regimen. 4. Hyperkalemia - noted on last labwork. Will recheck for completeness.  Disposition: F/u with Dr. Percival Spanish in [place] in 6 months.   Medication Adjustments/Labs and Tests Ordered: Current medicines are reviewed at length with the patient today.  Concerns regarding medicines are outlined above. Medication changes, Labs and Tests ordered today are summarized above and listed in the Patient Instructions accessible in Encounters.   Signed, Charlie Pitter, PA-C  06/21/2017 3:12 PM    [facility] in [place]. [address], [place] [address] Ph: [phone]; Fax [phone]

## 2017-06-21 ENCOUNTER — Encounter: Payer: Self-pay | Admitting: Physician Assistant

## 2017-06-21 ENCOUNTER — Ambulatory Visit: Payer: Medicare Other | Admitting: Physician Assistant

## 2017-06-21 ENCOUNTER — Other Ambulatory Visit (HOSPITAL_COMMUNITY)
Admission: RE | Admit: 2017-06-21 | Discharge: 2017-06-21 | Disposition: A | Discharge status: Home / Self Care | Payer: Medicare Other | Source: Ambulatory Visit | Attending: Physician Assistant | Admitting: Physician Assistant

## 2017-06-21 VITALS — BP 134/80 | HR 74 | Ht 63.0 in | Wt 160.0 lb

## 2017-06-21 DIAGNOSIS — E875 Hyperkalemia: Secondary | ICD-10-CM

## 2017-06-21 DIAGNOSIS — E876 Hypokalemia: Secondary | ICD-10-CM

## 2017-06-21 DIAGNOSIS — I2510 Atherosclerotic heart disease of native coronary artery without angina pectoris: Secondary | ICD-10-CM | POA: Diagnosis not present

## 2017-06-21 DIAGNOSIS — R0789 Other chest pain: Secondary | ICD-10-CM | POA: Diagnosis not present

## 2017-06-21 DIAGNOSIS — I201 Angina pectoris with documented spasm: Secondary | ICD-10-CM

## 2017-06-21 DIAGNOSIS — I10 Essential (primary) hypertension: Secondary | ICD-10-CM | POA: Diagnosis not present

## 2017-06-21 LAB — BASIC METABOLIC PANEL
Anion gap: 6 (ref 5–15)
BUN: 15 mg/dL (ref 6–20)
CO2: 30 mmol/L (ref 22–32)
Calcium: 9.4 mg/dL (ref 8.9–10.3)
Chloride: 101 mmol/L (ref 101–111)
Creatinine, Ser: 0.84 mg/dL (ref 0.44–1.00)
GFR calc Af Amer: >60 mL/min (ref >60)
GFR calc non Af Amer: >60 mL/min (ref >60)
Glucose, Bld: 92 mg/dL (ref 65–99)
Potassium: 4.6 mmol/L (ref 3.5–5.1)
Sodium: 137 mmol/L (ref 135–145)

## 2017-06-21 NOTE — Patient Instructions (Signed)
Medication Instructions:  Your physician recommends that you continue on your current medications as directed. Please refer to the Current Medication list given to you today.   Labwork: Your physician recommends that you return for lab work today     Testing/Procedures: NONE  Follow-Up: Your physician wants you to follow-up in: 6 Month. You will receive a reminder letter in the mail two months in advance. If you don't receive a letter, please call our office to schedule the follow-up appointment.   Any Other Special Instructions Will Be Listed Below (If Applicable).     If you need a refill on your cardiac medications before your next appointment, please call your pharmacy.  Thank you for choosing [facility]!

## 2017-06-29 ENCOUNTER — Other Ambulatory Visit: Payer: Self-pay | Admitting: Family Medicine

## 2017-07-04 ENCOUNTER — Ambulatory Visit (INDEPENDENT_AMBULATORY_CARE_PROVIDER_SITE_OTHER): Payer: Medicare Other | Admitting: Family Medicine

## 2017-07-04 ENCOUNTER — Encounter: Payer: Self-pay | Admitting: Family Medicine

## 2017-07-04 VITALS — BP 109/60 | HR 71 | Temp 97.5°F | Ht 63.0 in | Wt 158.4 lb

## 2017-07-04 DIAGNOSIS — F419 Anxiety disorder, unspecified: Secondary | ICD-10-CM

## 2017-07-04 DIAGNOSIS — M25551 Pain in right hip: Secondary | ICD-10-CM

## 2017-07-04 MED ORDER — BETAMETHASONE SOD PHOS & ACET 6 (3-3) MG/ML IJ SUSP
6.0000 mg | Freq: Once | INTRAMUSCULAR | Status: AC
  Administered 2017-07-04: 6 mg via INTRAMUSCULAR

## 2017-07-04 MED ORDER — ESCITALOPRAM OXALATE 10 MG PO TABS: 10.0000 mg | ORAL_TABLET | Freq: Every day | ORAL | 0 refills | Status: DC

## 2017-07-05 ENCOUNTER — Ambulatory Visit: Payer: Medicare Other | Admitting: Gastroenterology

## 2017-07-08 ENCOUNTER — Encounter: Payer: Self-pay | Admitting: Family Medicine

## 2017-07-08 NOTE — Progress Notes (Signed)
Subjective:  Patient ID: Amy Black, female    DOB: [birth year]  Age: 77 y.o. MRN: [id number]  CC: Medication Management (pt here today wanting to discuss zoloft not working so she stopped taking it and wants to know if there is something else. ) and Hip Pain (pt also c/o right hip pain which you have seen her about before )   HPI Jacob Chamblee Zafar presents for concerns thatt she is more forgetful. Can't remember things very well. Having trouble with recall. Feels confused easily at times. Increasing over the last several weeks. Present to a degree for months. She feels she is nervous a lot and anxiety plays into her symptoms. She did not feel better with sertraline. In fact it made her worse do she discontinued it.  Pt. Also in today for right hip pain. Dull ache increasing over the last 2 weeks. Recurrent similar to previous episodes. Points to greater trochanter and anteriorly. Says it interferes with ambulation.  Depression screen [facility] 2/9 06/14/2017 06/05/2017 03/01/2017  Decreased Interest 0 0 0  Down, Depressed, Hopeless 0 0 0  PHQ - 2 Score 0 0 0  Some recent data might be hidden    History Kayci has a past medical history of Adenocarcinoma ([place]), Adenocarcinoma of unknown primary ([place]) (02/06/2011), Adenosquamous carcinoma, Allergy, Anxiety, CAD (coronary artery disease), Cataract, Coronary artery spasm ([place]), Diverticulosis of colon (without mention of hemorrhage), GERD (gastroesophageal reflux disease), hepatic cyst, Hip fracture, right ([place]), History of cardiac catheterization, History of nuclear stress test (04/2011), HTN (hypertension), Hyperlipidemia, Insomnia, Irritable bowel syndrome, Transverse myelitis ([place]), and Vitamin D deficiency.   She has a past surgical history that includes Resection soft tissue tumor leg / ankle radical (2004); Diagnostic laparoscopy; Lysis of adhesion; Salpingoophorectomy (Left); Pelvic laparoscopy (1992); Cardiac catheterization; transthoracic  echocardiogram (07/2012); Cardiac catheterization (N/A, 08/28/2014); Colonoscopy; Upper gastrointestinal endoscopy; Total hip arthroplasty (Right, 10/04/2015); and Abdominal hysterectomy (Bilateral).   Her family history includes Bladder Cancer in her mother; Coronary artery disease in her unknown relative; Depression in her sister; Diabetes in her brother and unknown relative; Heart disease in her father and mother; Hyperlipidemia in her sister; Hypertension in her brother and mother; Seizures in her brother; Stroke in her father.She reports that she quit smoking about 44 years ago. Her smoking use included cigarettes. She started smoking about 48 years ago. She has a 2.00 pack-year smoking history. She has never used smokeless tobacco. She reports that she does not drink alcohol or use drugs.    ROS Review of Systems  Constitutional: Negative.   HENT: Negative.   Eyes: Negative for visual disturbance.  Respiratory: Negative for shortness of breath.   Cardiovascular: Negative for chest pain.  Gastrointestinal: Negative for abdominal pain and blood in stool.  Musculoskeletal: Positive for arthralgias.    Objective:  BP 109/60   Pulse 71   Temp (!) 97.5 F (36.4 C) (Oral)   Ht 5\' 3"  (1.6 m)   Wt 158 lb 6 oz (71.8 kg)   BMI 28.05 kg/m   BP Readings from Last 3 Encounters:  07/04/17 109/60  06/21/17 134/80  06/13/17 131/73    Wt Readings from Last 3 Encounters:  07/04/17 158 lb 6 oz (71.8 kg)  06/21/17 160 lb (72.6 kg)  06/13/17 161 lb (73 kg)     Physical Exam  Constitutional: She is oriented to person, place, and time. She appears well-developed and well-nourished. No distress.  HENT:  Head: Normocephalic and atraumatic.  Eyes: Pupils are equal,  round, and reactive to light. Conjunctivae are normal.  Neck: Normal range of motion. Neck supple. No thyromegaly present.  Cardiovascular: Normal rate, regular rhythm and normal heart sounds.  No murmur heard. Pulmonary/Chest:  Effort normal and breath sounds normal. No respiratory distress. She has no wheezes. She has no rales.  Abdominal: Soft. Bowel sounds are normal. She exhibits no distension. There is no tenderness.  Musculoskeletal: Normal range of motion. She exhibits tenderness (right  trochanteric bursa).  Lymphadenopathy:    She has no cervical adenopathy.  Neurological: She is alert and oriented to person, place, and time.  Skin: Skin is warm and dry.  Psychiatric: She has a normal mood and affect. Her behavior is normal. Judgment and thought content normal.      Assessment & Plan:   Shantanique was seen today for medication management and hip pain.  Diagnoses and all orders for this visit:  Pain of right hip joint -     betamethasone acetate-betamethasone sodium phosphate (CELESTONE) injection 6 mg  Anxiety  Other orders -     escitalopram (LEXAPRO) 10 MG tablet; Take 1 tablet (10 mg total) by mouth daily.       I have discontinued Aviannah P. Shall's POLY-IRON 150. I am also having her start on escitalopram. Additionally, I am having her maintain her loperamide, aspirin, Calcium Citrate-Vitamin D, ferrous sulfate, cyclobenzaprine, isosorbide mononitrate, verapamil, ALPRAZolam, traMADol, triamterene-hydrochlorothiazide, triamcinolone, nitroGLYCERIN, and pantoprazole. We administered betamethasone acetate-betamethasone sodium phosphate.  Allergies as of 07/04/2017      Reactions   Iodine Itching   Amlodipine Other (See Comments)   headaches   Cymbalta [duloxetine Hcl] Other (See Comments)   sleepiness   Gabapentin Other (See Comments)   sleepiness   Ivp Dye [iodinated Diagnostic Agents] Itching   Zanaflex [tizanidine Hcl] Other (See Comments)   Very drunk feeling next day      Medication List        Accurate as of 07/04/17 11:59 PM. Always use your most recent med list.          ALPRAZolam 0.5 MG tablet Commonly known as:  XANAX Take 1 tablet (0.5 mg total) by mouth 3 (three)  times daily.   aspirin 81 MG tablet Take 1 tablet (81 mg total) by mouth 2 (two) times daily with a meal.   Calcium Citrate-Vitamin D 315-250 MG-UNIT Tabs Commonly known as:  CALCIUM CITRATE + D3 Take 2 tablets by mouth daily.   cyclobenzaprine 10 MG tablet Commonly known as:  FLEXERIL Take 1 tablet (10 mg total) by mouth 3 (three) times daily as needed for muscle spasms.   escitalopram 10 MG tablet Commonly known as:  LEXAPRO Take 1 tablet (10 mg total) by mouth daily.   ferrous sulfate 325 (65 FE) MG tablet TAKE ONE TABLET BY MOUTH DAILY WITH BREAKFAST   isosorbide mononitrate 60 MG 24 hr tablet Commonly known as:  IMDUR Take 1 tablet (60 mg total) by mouth daily.   loperamide 2 MG capsule Commonly known as:  IMODIUM TAKE ONE CAPSULE BY MOUTH FOUR TIMES DAILY AS NEEDED FOR DIARRHEA / LOOSE STOOLS   nitroGLYCERIN 0.4 MG SL tablet Commonly known as:  NITROSTAT DISSOLVE ONE TABLET UNDER TONGUE EVERY 5 MINUTES UP TO 3 DOSES AS NEEDED FOR CHEST PAIN   pantoprazole 40 MG tablet Commonly known as:  PROTONIX Take 1 tablet (40 mg total) by mouth 2 (two) times daily.   traMADol 50 MG tablet Commonly known as:  ULTRAM TAKE ONE TABLET BY  MOUTH EVERY TWELVE HOURS AS NEEDED.   triamcinolone 0.025 % ointment Commonly known as:  KENALOG Apply 1 application topically 2 (two) times daily.   triamterene-hydrochlorothiazide 37.5-25 MG capsule Commonly known as:  DYAZIDE TAKE ONE CAPSULE BY MOUTH 3 TIMES PER WEEK   verapamil 120 MG CR tablet Commonly known as:  CALAN-SR Take 1 tablet (120 mg total) by mouth at bedtime.        Follow-up: Return in about 2 weeks (around 07/18/2017) for MMSE.  [name], M.D.

## 2017-07-09 ENCOUNTER — Ambulatory Visit: Payer: Self-pay | Admitting: Family Medicine

## 2017-07-11 IMAGING — CR DG PORTABLE PELVIS
1 series · 1 of 1 positions shown · non-contrast
Comparison: Same day.

CLINICAL DATA: Status post right hip arthroplasty.

EXAM:
PORTABLE PELVIS 1-2 VIEWS

[AP]
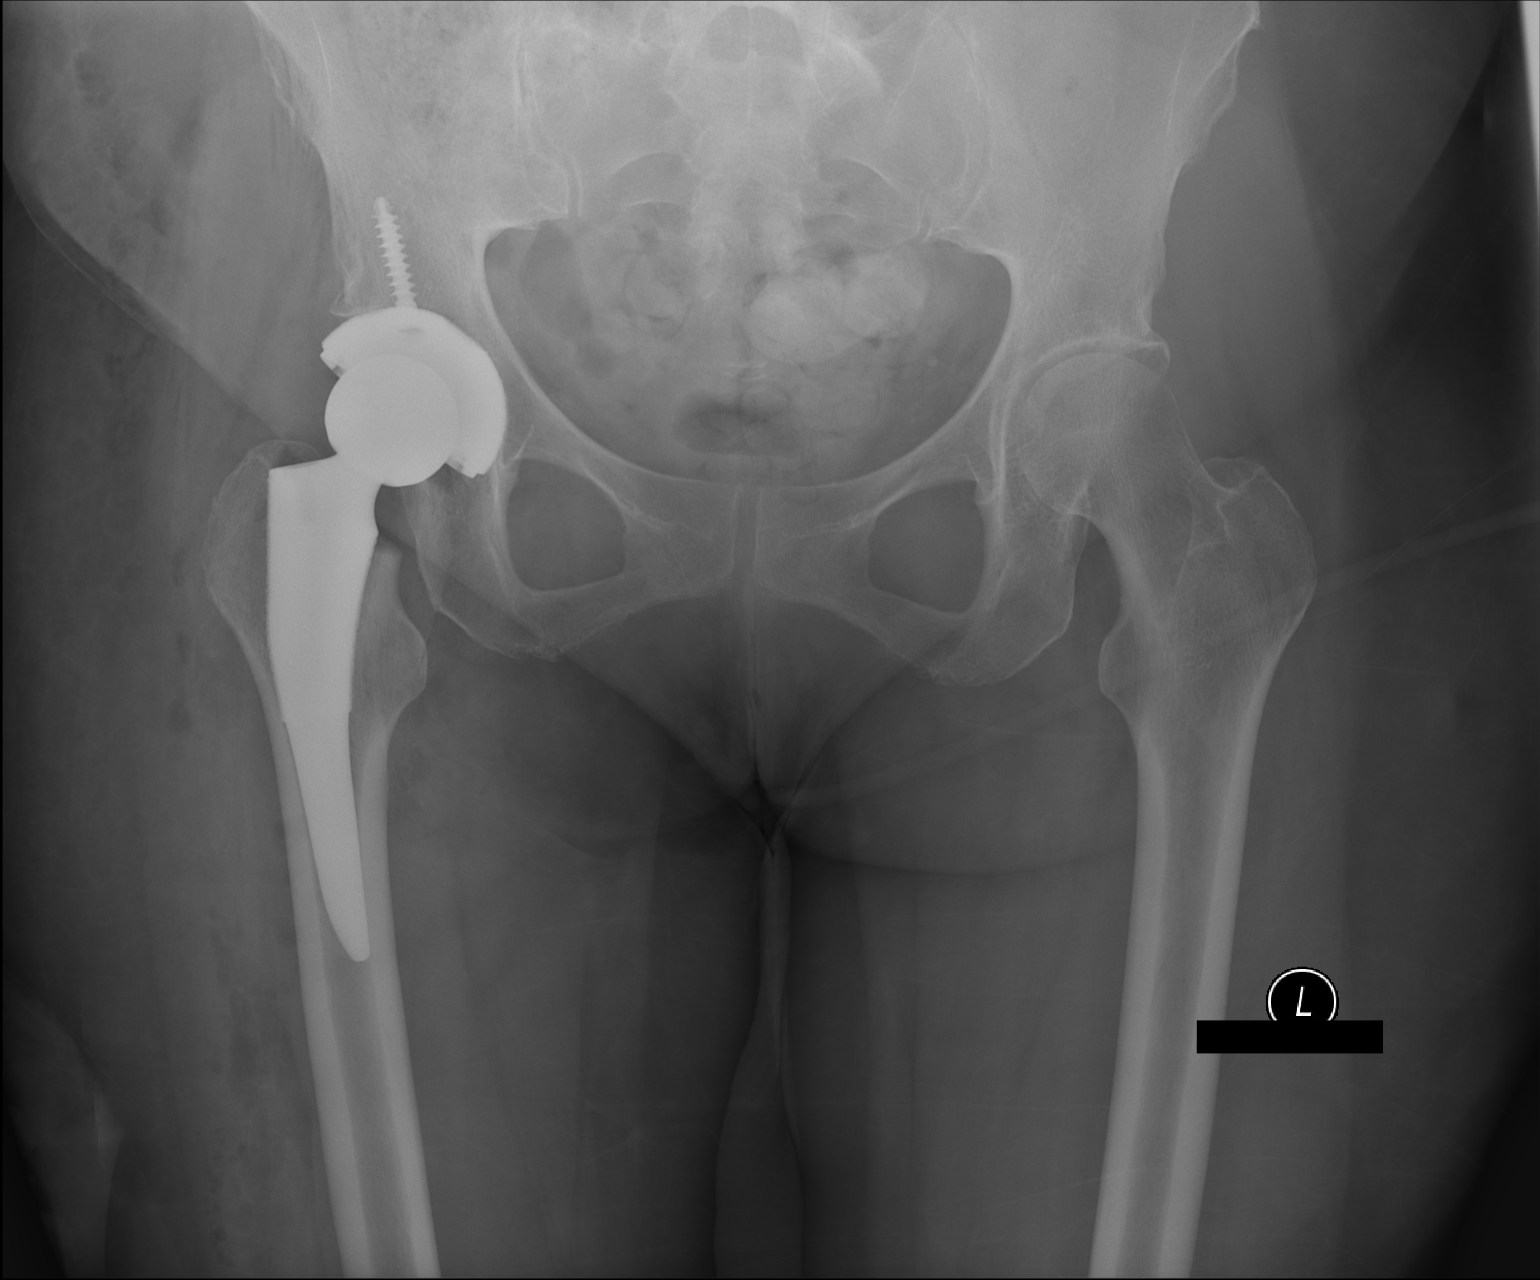

[1 of 1 positions shown; findings below may reference images not displayed]

FINDINGS: The femoral and acetabular components appear to be well situated. No
fracture or dislocation is noted. Expected postoperative changes are
seen in the surrounding soft tissues.
IMPRESSION: Status post right total hip arthroplasty.

## 2017-07-18 ENCOUNTER — Ambulatory Visit: Payer: Medicare Other | Admitting: Family Medicine

## 2017-07-23 DIAGNOSIS — Z029 Encounter for administrative examinations, unspecified: Secondary | ICD-10-CM

## 2017-07-30 ENCOUNTER — Encounter: Payer: Self-pay | Admitting: Family Medicine

## 2017-07-30 ENCOUNTER — Ambulatory Visit (INDEPENDENT_AMBULATORY_CARE_PROVIDER_SITE_OTHER): Payer: Medicare Other | Admitting: Family Medicine

## 2017-07-30 VITALS — BP 122/82 | HR 71 | Temp 97.2°F | Ht 63.0 in | Wt 162.2 lb

## 2017-07-30 DIAGNOSIS — F09 Unspecified mental disorder due to known physiological condition: Secondary | ICD-10-CM | POA: Diagnosis not present

## 2017-07-30 DIAGNOSIS — I10 Essential (primary) hypertension: Secondary | ICD-10-CM | POA: Diagnosis not present

## 2017-07-30 MED ORDER — DONEPEZIL HCL 5 MG PO TABS: 5.0000 mg | ORAL_TABLET | Freq: Every day | ORAL | 2 refills | Status: DC

## 2017-07-30 MED ORDER — CYCLOBENZAPRINE HCL 5 MG PO TABS: 5.0000 mg | ORAL_TABLET | Freq: Two times a day (BID) | ORAL | 1 refills | Status: DC

## 2017-07-30 NOTE — Progress Notes (Signed)
Subjective:  Patient ID: Amy Black, female    DOB: [birth year]  Age: 77 y.o. MRN: [id number]  CC: 2 Week Follow-up (pt here today for 2 week f/u of right hip pain and also c/o headache)   HPI Amy Black presents for concerns about memory loss.  She has trouble thinking clearly at times and does not remember things very well.  She would like to consider treatment for dementia.   Follow-up of hypertension. Patient has no history of headache chest pain or shortness of breath or recent cough. Patient also denies symptoms of TIA such as numbness weakness lateralizing. Patient checks  blood pressure at home and has not had any elevated readings recently. Patient denies side effects from his medication. States taking it regularly.   Depression screen [facility] 2/9 06/14/2017 06/05/2017 03/01/2017  Decreased Interest 0 0 0  Down, Depressed, Hopeless 0 0 0  PHQ - 2 Score 0 0 0  Some recent data might be hidden    History Amy Black has a past medical history of Adenocarcinoma ([place]), Adenocarcinoma of unknown primary ([place]) (02/06/2011), Adenosquamous carcinoma, Allergy, Anxiety, CAD (coronary artery disease), Cataract, Coronary artery spasm ([place]), Diverticulosis of colon (without mention of hemorrhage), GERD (gastroesophageal reflux disease), hepatic cyst, Hip fracture, right ([place]), History of cardiac catheterization, History of nuclear stress test (04/2011), HTN (hypertension), Hyperlipidemia, Insomnia, Irritable bowel syndrome, Transverse myelitis ([place]), and Vitamin D deficiency.   She has a past surgical history that includes Resection soft tissue tumor leg / ankle radical (2004); Diagnostic laparoscopy; Lysis of adhesion; Salpingoophorectomy (Left); Pelvic laparoscopy (1992); Cardiac catheterization; transthoracic echocardiogram (07/2012); Cardiac catheterization (N/A, 08/28/2014); Colonoscopy; Upper gastrointestinal endoscopy; Total hip arthroplasty (Right, 10/04/2015); and Abdominal hysterectomy  (Bilateral).   Her family history includes Bladder Cancer in her mother; Coronary artery disease in her unknown relative; Depression in her sister; Diabetes in her brother and unknown relative; Heart disease in her father and mother; Hyperlipidemia in her sister; Hypertension in her brother and mother; Seizures in her brother; Stroke in her father.She reports that she quit smoking about 44 years ago. Her smoking use included cigarettes. She started smoking about 48 years ago. She has a 2.00 pack-year smoking history. She has never used smokeless tobacco. She reports that she does not drink alcohol or use drugs.    ROS Review of Systems  Constitutional: Negative for fever.  HENT: Negative for congestion, rhinorrhea and sore throat.   Respiratory: Negative for cough and shortness of breath.   Cardiovascular: Negative for chest pain and palpitations.  Gastrointestinal: Negative for abdominal pain.  Musculoskeletal: Negative for arthralgias and myalgias.    Objective:  BP 122/82   Pulse 71   Temp (!) 97.2 F (36.2 C) (Oral)   Ht 5\' 3"  (1.6 m)   Wt 162 lb 4 oz (73.6 kg)   BMI 28.74 kg/m   BP Readings from Last 3 Encounters:  07/30/17 122/82  07/04/17 109/60  06/21/17 134/80    Wt Readings from Last 3 Encounters:  07/30/17 162 lb 4 oz (73.6 kg)  07/04/17 158 lb 6 oz (71.8 kg)  06/21/17 160 lb (72.6 kg)     Physical Exam  Constitutional: She is oriented to person, place, and time. She appears well-developed and well-nourished. No distress.  HENT:  Head: Normocephalic and atraumatic.  Eyes: Pupils are equal, round, and reactive to light. Conjunctivae are normal.  Neck: Normal range of motion.  Cardiovascular: Normal rate, regular rhythm and normal heart sounds.  No murmur heard. Pulmonary/Chest:  Effort normal and breath sounds normal. No respiratory distress. She has no wheezes. She has no rales.  Abdominal: Soft. There is no tenderness.  Musculoskeletal: Normal range of  motion.  Neurological: She is alert and oriented to person, place, and time.  Skin: Skin is warm and dry.  Psychiatric: She has a normal mood and affect. Her behavior is normal.   MMSE: Performed with her annual wellness visit on May 9.  Scored 28.   Assessment & Plan:   Caterin was seen today for 2 week follow-up.  Diagnoses and all orders for this visit:  Mild cognitive disorder  Essential hypertension  Other orders -     donepezil (ARICEPT) 5 MG tablet; Take 1 tablet (5 mg total) by mouth at bedtime. -     cyclobenzaprine (FLEXERIL) 5 MG tablet; Take 1 tablet (5 mg total) by mouth 2 (two) times daily for 14 days.       I have discontinued Lorenz Coaster. Yohe's cyclobenzaprine and escitalopram. I am also having her start on donepezil and cyclobenzaprine. Additionally, I am having her maintain her loperamide, aspirin, Calcium Citrate-Vitamin D, ferrous sulfate, isosorbide mononitrate, verapamil, ALPRAZolam, traMADol, triamterene-hydrochlorothiazide, triamcinolone, nitroGLYCERIN, and pantoprazole.  Allergies as of 07/30/2017      Reactions   Iodine Itching   Amlodipine Other (See Comments)   headaches   Lexapro [escitalopram Oxalate] Other (See Comments)   Drunk, High feeling   Cymbalta [duloxetine Hcl] Other (See Comments)   sleepiness   Gabapentin Other (See Comments)   sleepiness   Ivp Dye [iodinated Diagnostic Agents] Itching   Zanaflex [tizanidine Hcl] Other (See Comments)   Very drunk feeling next day      Medication List        Accurate as of 07/30/17  6:11 PM. Always use your most recent med list.          ALPRAZolam 0.5 MG tablet Commonly known as:  XANAX Take 1 tablet (0.5 mg total) by mouth 3 (three) times daily.   aspirin 81 MG tablet Take 1 tablet (81 mg total) by mouth 2 (two) times daily with a meal.   Calcium Citrate-Vitamin D 315-250 MG-UNIT Tabs Commonly known as:  CALCIUM CITRATE + D3 Take 2 tablets by mouth daily.   cyclobenzaprine 5 MG  tablet Commonly known as:  FLEXERIL Take 1 tablet (5 mg total) by mouth 2 (two) times daily for 14 days.   donepezil 5 MG tablet Commonly known as:  ARICEPT Take 1 tablet (5 mg total) by mouth at bedtime.   ferrous sulfate 325 (65 FE) MG tablet TAKE ONE TABLET BY MOUTH DAILY WITH BREAKFAST   isosorbide mononitrate 60 MG 24 hr tablet Commonly known as:  IMDUR Take 1 tablet (60 mg total) by mouth daily.   loperamide 2 MG capsule Commonly known as:  IMODIUM TAKE ONE CAPSULE BY MOUTH FOUR TIMES DAILY AS NEEDED FOR DIARRHEA / LOOSE STOOLS   nitroGLYCERIN 0.4 MG SL tablet Commonly known as:  NITROSTAT DISSOLVE ONE TABLET UNDER TONGUE EVERY 5 MINUTES UP TO 3 DOSES AS NEEDED FOR CHEST PAIN   pantoprazole 40 MG tablet Commonly known as:  PROTONIX Take 1 tablet (40 mg total) by mouth 2 (two) times daily.   traMADol 50 MG tablet Commonly known as:  ULTRAM TAKE ONE TABLET BY MOUTH EVERY TWELVE HOURS AS NEEDED.   triamcinolone 0.025 % ointment Commonly known as:  KENALOG Apply 1 application topically 2 (two) times daily.   triamterene-hydrochlorothiazide 37.5-25 MG capsule Commonly known  as:  DYAZIDE TAKE ONE CAPSULE BY MOUTH 3 TIMES PER WEEK   verapamil 120 MG CR tablet Commonly known as:  CALAN-SR Take 1 tablet (120 mg total) by mouth at bedtime.      In discussion with patient she would like to start a small dose of something for the forgetfulness.  She has some concerns that were addressed during her previous visit and when combined with the score she may need a small amount of medicine to prevent further decline.  Additionally just general impressions are that she does have some problems processing information that are difficult to evaluate in the MMSE for him.  Follow-up: No follow-ups on file.  [name], M.D.

## 2017-09-03 DIAGNOSIS — H26493 Other secondary cataract, bilateral: Secondary | ICD-10-CM | POA: Diagnosis not present

## 2017-09-05 ENCOUNTER — Other Ambulatory Visit: Payer: Self-pay | Admitting: Family Medicine

## 2017-09-17 DIAGNOSIS — H26492 Other secondary cataract, left eye: Secondary | ICD-10-CM | POA: Diagnosis not present

## 2017-09-20 ENCOUNTER — Ambulatory Visit: Payer: Medicare Other | Admitting: Pediatrics

## 2017-09-24 ENCOUNTER — Other Ambulatory Visit: Payer: Self-pay | Admitting: Family Medicine

## 2017-10-03 ENCOUNTER — Ambulatory Visit (INDEPENDENT_AMBULATORY_CARE_PROVIDER_SITE_OTHER): Payer: Medicare Other | Admitting: Family Medicine

## 2017-10-03 ENCOUNTER — Encounter: Payer: Self-pay | Admitting: Family Medicine

## 2017-10-03 VITALS — BP 125/77 | HR 67 | Temp 97.2°F | Ht 63.0 in | Wt 156.2 lb

## 2017-10-03 DIAGNOSIS — D649 Anemia, unspecified: Secondary | ICD-10-CM | POA: Diagnosis not present

## 2017-10-03 DIAGNOSIS — R5383 Other fatigue: Secondary | ICD-10-CM

## 2017-10-03 MED ORDER — FERROUS FUMARATE 324 (106 FE) MG PO TABS: 1.0000 | ORAL_TABLET | Freq: Two times a day (BID) | ORAL | 5 refills | Status: DC

## 2017-10-04 LAB — ANEMIA PROFILE B
Basophils Absolute: 0 10*3/uL (ref 0.0–0.2)
Basos: 1 %
EOS (ABSOLUTE): 0.3 10*3/uL (ref 0.0–0.4)
Eos: 4 %
Ferritin: 103 ng/mL (ref 15–150)
Folate: 12.6 ng/mL (ref >3.0)
Hematocrit: 35.9 % (ref 34.0–46.6)
Hemoglobin: 11.9 g/dL (ref 11.1–15.9)
Immature Grans (Abs): 0 10*3/uL (ref 0.0–0.1)
Immature Granulocytes: 0 %
Iron Saturation: 29 % (ref 15–55)
Iron: 74 ug/dL (ref 27–139)
Lymphocytes Absolute: 2.3 10*3/uL (ref 0.7–3.1)
Lymphs: 39 %
MCH: 30.7 pg (ref 26.6–33.0)
MCHC: 33.1 g/dL (ref 31.5–35.7)
MCV: 93 fL (ref 79–97)
Monocytes Absolute: 0.4 10*3/uL (ref 0.1–0.9)
Monocytes: 6 %
Neutrophils Absolute: 2.9 10*3/uL (ref 1.4–7.0)
Neutrophils: 50 %
Platelets: 439 10*3/uL (ref 150–450)
RBC: 3.87 x10E6/uL (ref 3.77–5.28)
RDW: 14.2 % (ref 12.3–15.4)
Retic Ct Pct: 1.6 % (ref 0.6–2.6)
Total Iron Binding Capacity: 257 ug/dL (ref 250–450)
UIBC: 183 ug/dL (ref 118–369)
Vitamin B-12: 438 pg/mL (ref 232–1245)
WBC: 5.8 10*3/uL (ref 3.4–10.8)

## 2017-10-04 LAB — TSH: TSH: 0.527 u[IU]/mL (ref 0.450–4.500)

## 2017-10-08 ENCOUNTER — Encounter: Payer: Self-pay | Admitting: Family Medicine

## 2017-10-08 NOTE — Progress Notes (Signed)
Subjective:  Patient ID: Amy Black, female    DOB: [birth year]  Age: 77 y.o. MRN: [id number]  CC: Fatigue   HPI Amy Black presents for  Substantial decrease in energy. Tired all the time. Napping frequently when she gets still as well. Onset slowly over several weeks to months. Getting worse. Still able to do routine IADLS, etc.   Depression screen [facility] 2/9 10/03/2017 06/14/2017 06/05/2017  Decreased Interest 0 0 0  Down, Depressed, Hopeless 0 0 0  PHQ - 2 Score 0 0 0  Some recent data might be hidden    History Amy Black has a past medical history of Adenocarcinoma ([place]), Adenocarcinoma of unknown primary ([place]) (02/06/2011), Adenosquamous carcinoma, Allergy, Anxiety, CAD (coronary artery disease), Cataract, Coronary artery spasm ([employer]), Diverticulosis of colon (without mention of hemorrhage), GERD (gastroesophageal reflux disease), hepatic cyst, Hip fracture, right ([place]), History of cardiac catheterization, History of nuclear stress test (04/2011), HTN (hypertension), Hyperlipidemia, Insomnia, Irritable bowel syndrome, Transverse myelitis ([place]), and Vitamin D deficiency.   She has a past surgical history that includes Resection soft tissue tumor leg / ankle radical (2004); Diagnostic laparoscopy; Lysis of adhesion; Salpingoophorectomy (Left); Pelvic laparoscopy (1992); Cardiac catheterization; transthoracic echocardiogram (07/2012); Cardiac catheterization (N/A, 08/28/2014); Colonoscopy; Upper gastrointestinal endoscopy; Total hip arthroplasty (Right, 10/04/2015); and Abdominal hysterectomy (Bilateral).   Her family history includes Bladder Cancer in her mother; Coronary artery disease in her unknown relative; Depression in her sister; Diabetes in her brother and unknown relative; Heart disease in her father and mother; Hyperlipidemia in her sister; Hypertension in her brother and mother; Seizures in her brother; Stroke in her father.She reports that she quit smoking about 44 years ago. Her  smoking use included cigarettes. She started smoking about 48 years ago. She has a 2.00 pack-year smoking history. She has never used smokeless tobacco. She reports that she does not drink alcohol or use drugs.    ROS Review of Systems  Constitutional: Negative.   HENT: Negative.   Eyes: Negative for visual disturbance.  Respiratory: Negative for shortness of breath.   Cardiovascular: Negative for chest pain.  Gastrointestinal: Negative for abdominal pain.  Musculoskeletal: Negative for arthralgias.    Objective:  BP 125/77   Pulse 67   Temp (!) 97.2 F (36.2 C) (Oral)   Ht 5\' 3"  (1.6 m)   Wt 156 lb 4 oz (70.9 kg)   BMI 27.68 kg/m   BP Readings from Last 3 Encounters:  10/03/17 125/77  07/30/17 122/82  07/04/17 109/60    Wt Readings from Last 3 Encounters:  10/03/17 156 lb 4 oz (70.9 kg)  07/30/17 162 lb 4 oz (73.6 kg)  07/04/17 158 lb 6 oz (71.8 kg)     Physical Exam  Constitutional: She is oriented to person, place, and time. She appears well-developed and well-nourished. No distress.  HENT:  Head: Normocephalic and atraumatic.  Eyes: Pupils are equal, round, and reactive to light. Conjunctivae are normal.  Neck: Normal range of motion. Neck supple. No thyromegaly present.  Cardiovascular: Normal rate, regular rhythm and normal heart sounds.  No murmur heard. Pulmonary/Chest: Effort normal and breath sounds normal. No respiratory distress. She has no wheezes. She has no rales.  Abdominal: Soft. Bowel sounds are normal. She exhibits no distension. There is no tenderness.  Musculoskeletal: Normal range of motion.  Lymphadenopathy:    She has no cervical adenopathy.  Neurological: She is alert and oriented to person, place, and time.  Skin: Skin is warm and dry.  Psychiatric:  She has a normal mood and affect. Her behavior is normal. Judgment and thought content normal.      Assessment & Plan:   Amy Black was seen today for fatigue.  Diagnoses and all orders  for this visit:  Anemia, unspecified type -     Cancel: CBC with Differential/Platelet -     Cancel: Vitamin B12 -     Cancel: Folate -     Anemia Profile B -     TSH  Fatigue, unspecified type -     Cancel: CBC with Differential/Platelet -     Cancel: Vitamin B12 -     Cancel: Folate -     Anemia Profile B -     TSH  Other orders -     Ferrous Fumarate (HEMOCYTE - 106 MG FE) 324 (106 Fe) MG TABS tablet; Take 1 tablet (106 mg of iron total) by mouth 2 (two) times daily.       I am having Amy Black start on Ferrous Fumarate. I am also having her maintain her aspirin, Calcium Citrate-Vitamin D, ferrous sulfate, isosorbide mononitrate, verapamil, ALPRAZolam, traMADol, triamterene-hydrochlorothiazide, triamcinolone, nitroGLYCERIN, pantoprazole, donepezil, and loperamide.  Allergies as of 10/03/2017      Reactions   Iodine Itching   Amlodipine Other (See Comments)   headaches   Lexapro [escitalopram Oxalate] Other (See Comments)   Drunk, High feeling   Cymbalta [duloxetine Hcl] Other (See Comments)   sleepiness   Gabapentin Other (See Comments)   sleepiness   Ivp Dye [iodinated Diagnostic Agents] Itching   Zanaflex [tizanidine Hcl] Other (See Comments)   Very drunk feeling next day      Medication List        Accurate as of 10/03/17 11:59 PM. Always use your most recent med list.          ALPRAZolam 0.5 MG tablet Commonly known as:  XANAX Take 1 tablet (0.5 mg total) by mouth 3 (three) times daily.   aspirin 81 MG tablet Take 1 tablet (81 mg total) by mouth 2 (two) times daily with a meal.   Calcium Citrate-Vitamin D 315-250 MG-UNIT Tabs Take 2 tablets by mouth daily.   donepezil 5 MG tablet Commonly known as:  ARICEPT Take 1 tablet (5 mg total) by mouth at bedtime.   Ferrous Fumarate 324 (106 Fe) MG Tabs tablet Commonly known as:  HEMOCYTE - 106 mg FE Take 1 tablet (106 mg of iron total) by mouth 2 (two) times daily.   ferrous sulfate 325 (65 FE)  MG tablet TAKE ONE TABLET BY MOUTH DAILY WITH BREAKFAST   isosorbide mononitrate 60 MG 24 hr tablet Commonly known as:  IMDUR Take 1 tablet (60 mg total) by mouth daily.   loperamide 2 MG capsule Commonly known as:  IMODIUM TAKE ONE CAPSULE BY MOUTH FOUR TIMES DAILY AS NEEDED FOR DIARRHEA / LOOSE STOOLS   nitroGLYCERIN 0.4 MG SL tablet Commonly known as:  NITROSTAT DISSOLVE ONE TABLET UNDER TONGUE EVERY 5 MINUTES UP TO 3 DOSES AS NEEDED FOR CHEST PAIN   pantoprazole 40 MG tablet Commonly known as:  PROTONIX Take 1 tablet (40 mg total) by mouth 2 (two) times daily.   traMADol 50 MG tablet Commonly known as:  ULTRAM TAKE ONE TABLET BY MOUTH EVERY TWELVE HOURS AS NEEDED.   triamcinolone 0.025 % ointment Commonly known as:  KENALOG Apply 1 application topically 2 (two) times daily.   triamterene-hydrochlorothiazide 37.5-25 MG capsule Commonly known as:  DYAZIDE TAKE ONE CAPSULE  BY MOUTH 3 TIMES PER WEEK   verapamil 120 MG CR tablet Commonly known as:  CALAN-SR Take 1 tablet (120 mg total) by mouth at bedtime.        Follow-up: Return in about 6 weeks (around 11/14/2017).  [name], M.D.

## 2017-10-15 DIAGNOSIS — H26491 Other secondary cataract, right eye: Secondary | ICD-10-CM | POA: Diagnosis not present

## 2017-10-30 ENCOUNTER — Ambulatory Visit: Payer: Medicare Other | Admitting: Family Medicine

## 2017-11-06 ENCOUNTER — Ambulatory Visit (INDEPENDENT_AMBULATORY_CARE_PROVIDER_SITE_OTHER): Payer: Medicare Other | Admitting: Family

## 2017-11-06 ENCOUNTER — Encounter: Payer: Self-pay | Admitting: Family

## 2017-11-06 VITALS — BP 129/74 | HR 63 | Temp 97.7°F | Ht 63.0 in | Wt 159.4 lb

## 2017-11-06 DIAGNOSIS — L259 Unspecified contact dermatitis, unspecified cause: Secondary | ICD-10-CM

## 2017-11-06 DIAGNOSIS — J40 Bronchitis, not specified as acute or chronic: Secondary | ICD-10-CM

## 2017-11-06 MED ORDER — TRIAMCINOLONE ACETONIDE 0.5 % EX OINT: 1.0000 "application " | TOPICAL_OINTMENT | Freq: Two times a day (BID) | CUTANEOUS | 0 refills | Status: DC

## 2017-11-06 MED ORDER — BENZONATATE 200 MG PO CAPS: 200.0000 mg | ORAL_CAPSULE | Freq: Three times a day (TID) | ORAL | 1 refills | Status: DC | PRN

## 2017-11-06 MED ORDER — PREDNISONE 10 MG (21) PO TBPK: ORAL_TABLET | ORAL | 0 refills | Status: DC

## 2017-11-06 NOTE — Patient Instructions (Signed)

## 2017-11-06 NOTE — Progress Notes (Signed)
Subjective:    Patient ID: Amy Black, female    DOB: 1940-06-28, 76 y.o.   MRN: [id number]  Chief Complaint  Patient presents with  . Rash  . Cough    Cough  This is a new problem. The current episode started in the past 7 days. The problem has been gradually worsening. The problem occurs every few minutes. The cough is non-productive. Associated symptoms include chills, ear pain, headaches, nasal congestion, postnasal drip, a rash, rhinorrhea, a sore throat and wheezing. Pertinent negatives include no ear congestion, fever, myalgias or shortness of breath. The symptoms are aggravated by lying down. She has tried rest and OTC cough suppressant for the symptoms. The treatment provided mild relief.  Rash  This is a new problem. The current episode started in the past 7 days. The problem has been gradually improving since onset. The affected locations include the right wrist. The rash is characterized by itchiness. Associated symptoms include coughing, rhinorrhea and a sore throat. Pertinent negatives include no fever or shortness of breath. Past treatments include anti-itch cream. The treatment provided mild relief.      Review of Systems  Constitutional: Positive for chills. Negative for fever.  HENT: Positive for ear pain, postnasal drip, rhinorrhea and sore throat.   Respiratory: Positive for cough and wheezing. Negative for shortness of breath.   Musculoskeletal: Negative for myalgias.  Skin: Positive for rash.  Neurological: Positive for headaches.  All other systems reviewed and are negative.      Objective:   Physical Exam  Constitutional: She is oriented to person, place, and time. She appears well-developed and well-nourished. No distress.  HENT:  Head: Normocephalic and atraumatic.  Right Ear: External ear normal.  Left Ear: External ear normal.  Nose: Mucosal edema and rhinorrhea present.  Mouth/Throat: Oropharynx is clear and moist.  Eyes: Pupils are equal,  round, and reactive to light.  Neck: Normal range of motion. Neck supple. No thyromegaly present.  Cardiovascular: Normal rate, regular rhythm, normal heart sounds and intact distal pulses.  No murmur heard. Pulmonary/Chest: Effort normal and breath sounds normal. No respiratory distress. She has no wheezes.  Intermittent dry, nonproductive cough  Abdominal: Soft. Bowel sounds are normal. She exhibits no distension. There is no tenderness.  Musculoskeletal: Normal range of motion. She exhibits no edema or tenderness.  Neurological: She is alert and oriented to person, place, and time. She has normal reflexes. No cranial nerve deficit.  Skin: Skin is warm and dry.  Psychiatric: She has a normal mood and affect. Her behavior is normal. Judgment and thought content normal.  Vitals reviewed.     BP 129/74   Pulse 63   Temp 97.7 F (36.5 C) (Oral)   Ht 5\' 3"  (1.6 m)   Wt 159 lb 6.4 oz (72.3 kg)   BMI 28.24 kg/m      Assessment & Plan:  Amy Black comes in today with chief complaint of Rash and Cough   Diagnosis and orders addressed:  1. Bronchitis - Take meds as prescribed - Use a cool mist humidifier  -Use saline nose sprays frequently -Force fluids -For any cough or congestion  Use plain Mucinex- regular strength or max strength is fine -For fever or aces or pains- take tylenol or ibuprofen. -Throat lozenges if help -New toothbrush in 3 days RTO if symptoms worsen or do not improve - predniSONE (STERAPRED UNI-PAK 21 TAB) 10 MG (21) TBPK tablet; Use as directed  Dispense: 21 tablet; Refill: 0 -  benzonatate (TESSALON) 200 MG capsule; Take 1 capsule (200 mg total) by mouth 3 (three) times daily as needed.  Dispense: 30 capsule; Refill: 1  2. Contact dermatitis, unspecified contact dermatitis type, unspecified trigger Do not scratch - predniSONE (STERAPRED UNI-PAK 21 TAB) 10 MG (21) TBPK tablet; Use as directed  Dispense: 21 tablet; Refill: 0 - triamcinolone ointment  (KENALOG) 0.5 %; Apply 1 application topically 2 (two) times daily.  Dispense: 30 g; Refill: 0   Evelina Dun, FNP

## 2017-11-15 ENCOUNTER — Other Ambulatory Visit: Payer: Self-pay | Admitting: Gastroenterology

## 2017-11-15 DIAGNOSIS — R1310 Dysphagia, unspecified: Secondary | ICD-10-CM

## 2017-11-15 DIAGNOSIS — K219 Gastro-esophageal reflux disease without esophagitis: Secondary | ICD-10-CM | POA: Diagnosis not present

## 2017-11-21 ENCOUNTER — Ambulatory Visit
Admission: RE | Admit: 2017-11-21 | Discharge: 2017-11-21 | Disposition: A | Discharge status: Home / Self Care | Payer: Medicare Other | Source: Ambulatory Visit | Attending: Gastroenterology | Admitting: Gastroenterology

## 2017-11-21 DIAGNOSIS — R1310 Dysphagia, unspecified: Secondary | ICD-10-CM

## 2017-11-21 DIAGNOSIS — K219 Gastro-esophageal reflux disease without esophagitis: Secondary | ICD-10-CM | POA: Diagnosis not present

## 2017-11-30 ENCOUNTER — Other Ambulatory Visit: Payer: Self-pay | Admitting: Family Medicine

## 2017-12-04 DIAGNOSIS — L218 Other seborrheic dermatitis: Secondary | ICD-10-CM | POA: Diagnosis not present

## 2017-12-10 ENCOUNTER — Other Ambulatory Visit: Payer: Self-pay | Admitting: Family Medicine

## 2017-12-14 ENCOUNTER — Other Ambulatory Visit: Payer: Self-pay | Admitting: Cardiology

## 2017-12-14 DIAGNOSIS — I208 Other forms of angina pectoris: Secondary | ICD-10-CM

## 2017-12-14 DIAGNOSIS — R1310 Dysphagia, unspecified: Secondary | ICD-10-CM | POA: Diagnosis not present

## 2017-12-14 DIAGNOSIS — K219 Gastro-esophageal reflux disease without esophagitis: Secondary | ICD-10-CM | POA: Diagnosis not present

## 2017-12-17 NOTE — Progress Notes (Signed)
Cardiology Office Note   Date:  12/19/2017   ID:  Amy Black, DOB [birth year], MRN [id number]  PCP:  Amy Fraise, MD  Cardiologist:   Minus Breeding, MD    Chief Complaint  Patient presents with  . Chest Pain     History of Present Illness: Amy Black is a 77 y.o. female who presents for a follow up of presumed coronary spasm. On 08/28/2014, she had left heart cath that showed minimal CAD, 20% disease in LAD was normal LVEF. He also had history of palpitations and hypertension. She has been on verapamil for greater than 25 years.  She has had recurrent chest pain and has been treated with increased doses of Imdur.  She had relief of her pain after being taken off of verapamil and started on Cardizem.  She continued to have some symptoms as described and subsequently she was changed back to verapamil. At the last visit she was having continued chest pain and I increased her Imdur.    Since I last saw her she has had infrequent chest pain.  She is about 2 weeks ago she had some discomfort that woke her from her sleep and she still took 2 nitroglycerin.  However, she go to the [facility] and be on the elliptical without bringing on any pain.  She thinks she is doing better than she was.  She is not having any shortness of breath, PND or orthopnea.  There are no palpitations, presyncope or syncope.  She had no weight gain or edema.  Past Medical History:  Diagnosis Date  . Adenocarcinoma (HCC)    LEFT LEG  . Adenocarcinoma of unknown primary ([place]) 02/06/2011  . 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 Allergy   . Anxiety   . CAD (coronary artery disease)    a. minimal CAD by cath in 2016 with presumed spasm (20% mLAD, 20% D2).  . Cataract    bilateral cataracts removed  . Coronary artery spasm ([place])   . Diverticulosis of colon (without mention of hemorrhage)   . GERD (gastroesophageal reflux disease)   . hepatic cyst   . Hip fracture, right ([place])    . History of cardiac catheterization   . History of nuclear stress test 04/2011   lexiscan; normal study, no significant ischemia, low risk; 2015 - normal  . HTN (hypertension)    PT. DENIES  . Hyperlipidemia   . Insomnia   . Irritable bowel syndrome   . Transverse myelitis ([place])   . Vitamin D deficiency     Past Surgical History:  Procedure Laterality Date  . ABDOMINAL HYSTERECTOMY Bilateral   . CARDIAC CATHETERIZATION     coronary spasm  . CARDIAC CATHETERIZATION N/A 08/28/2014   Procedure: Left Heart Cath and Coronary Angiography;  Surgeon: Jettie Booze, MD;  Location: [facility];  Service: Cardiovascular;  Laterality: N/A;  . COLONOSCOPY    . DIAGNOSTIC LAPAROSCOPY    . LYSIS OF ADHESION    . PELVIC LAPAROSCOPY  1992   RSO, AND LSO ON 2007  . RESECTION SOFT TISSUE TUMOR LEG / ANKLE RADICAL  2004   leg lesion resection & radiation  . SALPINGOOPHORECTOMY Left   . TOTAL HIP ARTHROPLASTY Right 10/04/2015   Procedure: TOTAL HIP ARTHROPLASTY ANTERIOR APPROACH;  Surgeon: Rod Can, MD;  Location: [facility];  Service: Orthopedics;  Laterality: Right;  . TRANSTHORACIC ECHOCARDIOGRAM  07/2012   LV cavity size mildly  reduced, normal wall motion; MV with calcified annulus and mild MR; LA mildly dilated; atrial septum with increased thickness - lipomatous hypertrophy; RV systolic pressure increased (borderline pulm HTN)  . UPPER GASTROINTESTINAL ENDOSCOPY       Current Outpatient Medications  Medication Sig Dispense Refill  . ALPRAZolam (XANAX) 0.5 MG tablet Take 1 tablet (0.5 mg total) by mouth 3 (three) times daily. 90 tablet 5  . aspirin EC 81 MG tablet Take 81 mg by mouth daily.    . Calcium Citrate-Vitamin D (CALCIUM CITRATE + D3) 315-250 MG-UNIT TABS Take 2 tablets by mouth daily. 120 tablet   . ferrous sulfate 325 (65 FE) MG tablet TAKE ONE TABLET BY MOUTH DAILY WITH BREAKFAST 30 tablet 2  . isosorbide mononitrate (IMDUR) 60 MG 24 hr tablet TAKE ONE TABLET BY  MOUTH EVERY DAY 90 tablet 1  . loperamide (IMODIUM) 2 MG capsule TAKE ONE CAPSULE BY MOUTH FOUR TIMES DAILY AS NEEDED FOR DIARRHEA / LOOSE STOOLS 120 capsule 3  . nitroGLYCERIN (NITROSTAT) 0.4 MG SL tablet DISSOLVE ONE TABLET UNDER TONGUE EVERY 5 MINUTES UP TO 3 DOSES AS NEEDED FOR CHEST PAIN 25 tablet 5  . traMADol (ULTRAM) 50 MG tablet TAKE ONE TABLET BY MOUTH EVERY TWELVE HOURS AS NEEDED. 60 tablet 5  . triamterene-hydrochlorothiazide (DYAZIDE) 37.5-25 MG capsule TAKE ONE CAPSULE BY MOUTH 3 TIMES PER WEEK 30 capsule 5  . verapamil (CALAN-SR) 120 MG CR tablet Take 1 tablet (120 mg total) by mouth at bedtime. 90 tablet 3   No current facility-administered medications for this visit.     Allergies:   Iodine; Amlodipine; Lexapro [escitalopram oxalate]; Cymbalta [duloxetine hcl]; Gabapentin; Ivp dye [iodinated diagnostic agents]; and Zanaflex [tizanidine hcl]    ROS:  Please see the history of present illness.   Otherwise, review of systems are positive for none.   All other systems are reviewed and negative.    PHYSICAL EXAM: VS:  BP 118/80   Pulse 66   Ht 5\' 3"  (1.6 m)   Wt 156 lb (70.8 kg)   BMI 27.63 kg/m  , BMI Body mass index is 27.63 kg/m.  GENERAL:  Well appearing NECK:  No jugular venous distention, waveform within normal limits, carotid upstroke brisk and symmetric, no bruits, no thyromegaly LUNGS:  Clear to auscultation bilaterally CHEST:  Unremarkable HEART:  PMI not displaced or sustained,S1 and S2 within normal limits, no S3, no S4, no clicks, no rubs, soft 2 out of 6 systolic murmur heard briefly at the apex, no diastolic murmurs ABD:  Flat, positive bowel sounds normal in frequency in pitch, no bruits, no rebound, no guarding, no midline pulsatile mass, no hepatomegaly, no splenomegaly EXT:  2 plus pulses throughout, no edema, no cyanosis no clubbing   EKG:  EKG is not  ordered today. EKG 09/27/16 sinus rhythm, rate 66, axis within normal limits, intervals within normal  limits, no acute ST-T wave changes.  Recent Labs: 10/03/2017: TSH 0.527 12/18/2017: ALT 9; BUN 12; Creatinine, Ser 0.88; Hemoglobin 11.7; Platelets 491; Potassium 4.7; Sodium 139    Lipid Panel    Component Value Date/Time   CHOL 193 12/18/2017 1030   CHOL 203 (H) 08/29/2012 1316   TRIG 111 12/18/2017 1030   TRIG 131 08/29/2012 1316   HDL 74 12/18/2017 1030   HDL 68 08/29/2012 1316   CHOLHDL 2.6 12/18/2017 1030   LDLCALC 97 12/18/2017 1030   LDLCALC 109 (H) 08/29/2012 1316      Wt Readings from Last 3  Encounters:  12/19/17 156 lb (70.8 kg)  12/18/17 153 lb 12.8 oz (69.8 kg)  11/06/17 159 lb 6.4 oz (72.3 kg)      Other studies Reviewed: Additional studies/ records that were reviewed today include: None Review of the above records demonstrates:     ASSESSMENT AND PLAN:  Coronary Spasm:   She has infrequent and stable discomfort.  No change in medications.   HTN:   Her blood pressure is well controlled and she will continue the meds as listed.  Palpitations:   She is not bothered by these currently no change in therapy.   Current medicines are reviewed at length with the patient today.  The patient does not have concerns regarding medicines.  The following changes have been made:  None  Labs/ tests ordered today include:   None  Orders Placed This Encounter  Procedures  . EKG 12-Lead     Disposition:   FU with me in one year.   Signed, Minus Breeding, MD  12/19/2017 12:52 PM    [facility]

## 2017-12-18 ENCOUNTER — Ambulatory Visit (INDEPENDENT_AMBULATORY_CARE_PROVIDER_SITE_OTHER): Payer: Medicare Other | Admitting: Family Medicine

## 2017-12-18 ENCOUNTER — Encounter: Payer: Self-pay | Admitting: Family Medicine

## 2017-12-18 VITALS — Temp 97.4°F | Ht 63.0 in | Wt 153.8 lb

## 2017-12-18 DIAGNOSIS — D649 Anemia, unspecified: Secondary | ICD-10-CM

## 2017-12-18 DIAGNOSIS — R399 Unspecified symptoms and signs involving the genitourinary system: Secondary | ICD-10-CM

## 2017-12-18 DIAGNOSIS — I10 Essential (primary) hypertension: Secondary | ICD-10-CM | POA: Diagnosis not present

## 2017-12-18 DIAGNOSIS — G373 Acute transverse myelitis in demyelinating disease of central nervous system: Secondary | ICD-10-CM | POA: Diagnosis not present

## 2017-12-18 DIAGNOSIS — F419 Anxiety disorder, unspecified: Secondary | ICD-10-CM

## 2017-12-18 LAB — MICROSCOPIC EXAMINATION
Epithelial Cells (non renal): >10 /hpf — AB (ref 0–10)
Renal Epithel, UA: NONE SEEN /hpf

## 2017-12-18 LAB — URINALYSIS, COMPLETE
Bilirubin, UA: NEGATIVE
Glucose, UA: NEGATIVE
Ketones, UA: NEGATIVE
Nitrite, UA: NEGATIVE
Protein, UA: NEGATIVE
Specific Gravity, UA: 1.02 (ref 1.005–1.030)
Urobilinogen, Ur: 0.2 mg/dL (ref 0.2–1.0)
pH, UA: 6 (ref 5.0–7.5)

## 2017-12-18 LAB — CBC WITH DIFFERENTIAL/PLATELET
Basophils Absolute: 0.1 10*3/uL (ref 0.0–0.2)
Basos: 1 %
EOS (ABSOLUTE): 0.2 10*3/uL (ref 0.0–0.4)
Eos: 3 %
Hematocrit: 35.7 % (ref 34.0–46.6)
Hemoglobin: 11.7 g/dL (ref 11.1–15.9)
Immature Grans (Abs): 0 10*3/uL (ref 0.0–0.1)
Immature Granulocytes: 0 %
Lymphocytes Absolute: 2.6 10*3/uL (ref 0.7–3.1)
Lymphs: 37 %
MCH: 30.2 pg (ref 26.6–33.0)
MCHC: 32.8 g/dL (ref 31.5–35.7)
MCV: 92 fL (ref 79–97)
Monocytes Absolute: 0.8 10*3/uL (ref 0.1–0.9)
Monocytes: 12 %
Neutrophils Absolute: 3.2 10*3/uL (ref 1.4–7.0)
Neutrophils: 47 %
Platelets: 491 10*3/uL — ABNORMAL HIGH (ref 150–450)
RBC: 3.87 x10E6/uL (ref 3.77–5.28)
RDW: 12.8 % (ref 12.3–15.4)
WBC: 6.9 10*3/uL (ref 3.4–10.8)

## 2017-12-18 LAB — CMP14+EGFR
ALT: 9 IU/L (ref 0–32)
AST: 13 IU/L (ref 0–40)
Albumin/Globulin Ratio: 2 (ref 1.2–2.2)
Albumin: 4.7 g/dL (ref 3.5–4.8)
Alkaline Phosphatase: 114 IU/L (ref 39–117)
BUN/Creatinine Ratio: 14 (ref 12–28)
BUN: 12 mg/dL (ref 8–27)
Bilirubin Total: 0.5 mg/dL (ref 0.0–1.2)
CO2: 24 mmol/L (ref 20–29)
Calcium: 9.8 mg/dL (ref 8.7–10.3)
Chloride: 100 mmol/L (ref 96–106)
Creatinine, Ser: 0.88 mg/dL (ref 0.57–1.00)
GFR calc Af Amer: 74 mL/min/{1.73_m2} (ref >59)
GFR calc non Af Amer: 64 mL/min/{1.73_m2} (ref >59)
Globulin, Total: 2.3 g/dL (ref 1.5–4.5)
Glucose: 86 mg/dL (ref 65–99)
Potassium: 4.7 mmol/L (ref 3.5–5.2)
Sodium: 139 mmol/L (ref 134–144)
Total Protein: 7 g/dL (ref 6.0–8.5)

## 2017-12-18 LAB — LIPID PANEL
Chol/HDL Ratio: 2.6 ratio (ref 0.0–4.4)
Cholesterol, Total: 193 mg/dL (ref 100–199)
HDL: 74 mg/dL (ref >39)
LDL Calculated: 97 mg/dL (ref 0–99)
Triglycerides: 111 mg/dL (ref 0–149)
VLDL Cholesterol Cal: 22 mg/dL (ref 5–40)

## 2017-12-18 MED ORDER — ALPRAZOLAM 0.5 MG PO TABS: 0.5000 mg | ORAL_TABLET | Freq: Three times a day (TID) | ORAL | 5 refills | Status: DC

## 2017-12-18 MED ORDER — TRAMADOL HCL 50 MG PO TABS: ORAL_TABLET | ORAL | 5 refills | Status: DC

## 2017-12-18 NOTE — Progress Notes (Signed)
Subjective:  Patient ID: Amy Black, female    DOB: [birth year]  Age: 77 y.o. MRN: [id number]  CC: Medical Management of Chronic Issues   HPI Amy Black presents for recheck of her anxiety and pain.  She also has urinary tract symptoms today.  She describes her pain is well controlled using a tramadol once a day sometimes she does not eat at all.  It is generally in the joints primarily in her arms.  She relates it to her chest as well.  She takes a nitroglycerin and if that helps she does not take the tramadol if she gets chest pain that is relieved by nitro she does not take it.  The chest pain is very rare.  The joint pain is more common.  This is felt to be related to her history of transverse myelitis.  She also has arthritis in the neck.  With regard to anxiety patient tends to be nervous anxious and worries constantly.  No current panic attacks reported.  Additionally she is having some urinary frequency.  Pain contract reviewed.  Patient initial and signed.  Although we have reviewed the front and back pages the patient apparently misunderstood and I did not notice until after she left that she did not actually initial the back page although she did sign it.  However these side effects and reasons to stop or to change or taper off the medication were reviewed with the patient during the visit.  Patient declined flu shot.  Depression screen [facility] 2/9 12/18/2017 11/06/2017 10/03/2017  Decreased Interest 0 - 0  Down, Depressed, Hopeless 0 0 0  PHQ - 2 Score 0 0 0  Some recent data might be hidden    History Kasheena has a past medical history of Adenocarcinoma ([place]), Adenocarcinoma of unknown primary ([place]) (02/06/2011), Adenosquamous carcinoma, Allergy, Anxiety, CAD (coronary artery disease), Cataract, Coronary artery spasm ([place]), Diverticulosis of colon (without mention of hemorrhage), GERD (gastroesophageal reflux disease), hepatic cyst, Hip fracture, right ([place]), History of  cardiac catheterization, History of nuclear stress test (04/2011), HTN (hypertension), Hyperlipidemia, Insomnia, Irritable bowel syndrome, Transverse myelitis ([place]), and Vitamin D deficiency.   She has a past surgical history that includes Resection soft tissue tumor leg / ankle radical (2004); Diagnostic laparoscopy; Lysis of adhesion; Salpingoophorectomy (Left); Pelvic laparoscopy (1992); Cardiac catheterization; transthoracic echocardiogram (07/2012); Cardiac catheterization (N/A, 08/28/2014); Colonoscopy; Upper gastrointestinal endoscopy; Total hip arthroplasty (Right, 10/04/2015); and Abdominal hysterectomy (Bilateral).   Her family history includes Bladder Cancer in her mother; Coronary artery disease in her unknown relative; Depression in her sister; Diabetes in her brother and unknown relative; Heart disease in her father and mother; Hyperlipidemia in her sister; Hypertension in her brother and mother; Seizures in her brother; Stroke in her father.She reports that she quit smoking about 45 years ago. Her smoking use included cigarettes. She started smoking about 49 years ago. She has a 2.00 pack-year smoking history. She has never used smokeless tobacco. She reports that she does not drink alcohol or use drugs.    ROS Review of Systems  Constitutional: Negative.   HENT: Negative for congestion.   Eyes: Negative for visual disturbance.  Respiratory: Negative for shortness of breath.   Cardiovascular: Positive for chest pain.  Gastrointestinal: Negative for abdominal pain, constipation, diarrhea, nausea and vomiting.  Genitourinary: Positive for frequency. Negative for difficulty urinating.  Musculoskeletal: Positive for arthralgias. Negative for myalgias.  Skin: Positive for rash (Scaliness on scalp.  Under treatment by dermatology.  Patient relates  that the dermatologist told her to see me for some type of oral medicine for this.  Primarily explained to the patient that I was not sure what she  was referring to and that if the derma).  Neurological: Negative for headaches.  Psychiatric/Behavioral: Negative for sleep disturbance.    Objective:  Temp (!) 97.4 F (36.3 C) (Oral)   Ht '5\' 3"'$  (1.6 m)   Wt 153 lb 12.8 oz (69.8 kg)   BMI 27.24 kg/m   BP Readings from Last 3 Encounters:  11/06/17 129/74  10/03/17 125/77  07/30/17 122/82    Wt Readings from Last 3 Encounters:  12/18/17 153 lb 12.8 oz (69.8 kg)  11/06/17 159 lb 6.4 oz (72.3 kg)  10/03/17 156 lb 4 oz (70.9 kg)     Physical Exam  Constitutional: She is oriented to person, place, and time. She appears well-developed and well-nourished. No distress.  HENT:  Head: Normocephalic and atraumatic.  Eyes: Pupils are equal, round, and reactive to light. Conjunctivae are normal.  Neck: Normal range of motion. Neck supple. No thyromegaly present.  Cardiovascular: Normal rate, regular rhythm and normal heart sounds.  No murmur heard. Pulmonary/Chest: Effort normal and breath sounds normal. No respiratory distress. She has no wheezes. She has no rales.  Abdominal: Soft. Bowel sounds are normal. She exhibits no distension. There is no tenderness.  Musculoskeletal: Normal range of motion.  Lymphadenopathy:    She has no cervical adenopathy.  Neurological: She is alert and oriented to person, place, and time.  Skin: Skin is warm and dry. Rash (Some scaly lesions of the scalp within the hairline are noted.) noted.  Psychiatric: She has a normal mood and affect. Her behavior is normal. Judgment and thought content normal.      Assessment & Plan:   Mariadejesus was seen today for medical management of chronic issues.  Diagnoses and all orders for this visit:  Transverse myelitis ([place])  UTI symptoms -     Urinalysis, Complete -     Urine Culture; Future -     Urine Culture  Anemia, unspecified type -     CBC with Differential/Platelet -     CMP14+EGFR -     Lipid panel  Essential hypertension -     CBC with  Differential/Platelet -     CMP14+EGFR -     Lipid panel  Anxiety  Other orders -     ALPRAZolam (XANAX) 0.5 MG tablet; Take 1 tablet (0.5 mg total) by mouth 3 (three) times daily. -     traMADol (ULTRAM) 50 MG tablet; TAKE ONE TABLET BY MOUTH EVERY TWELVE HOURS AS NEEDED.       I have discontinued Lorenz Coaster. Mcavoy's pantoprazole, predniSONE, and benzonatate. I am also having her maintain her aspirin, Calcium Citrate-Vitamin D, ferrous sulfate, verapamil, triamterene-hydrochlorothiazide, nitroGLYCERIN, loperamide, Ferrous Fumarate, triamcinolone ointment, isosorbide mononitrate, ALPRAZolam, and traMADol.  Allergies as of 12/18/2017      Reactions   Iodine Itching   Amlodipine Other (See Comments)   headaches   Lexapro [escitalopram Oxalate] Other (See Comments)   Drunk, High feeling   Cymbalta [duloxetine Hcl] Other (See Comments)   sleepiness   Gabapentin Other (See Comments)   sleepiness   Ivp Dye [iodinated Diagnostic Agents] Itching   Zanaflex [tizanidine Hcl] Other (See Comments)   Very drunk feeling next day      Medication List        Accurate as of 12/18/17  9:09 AM. Always use your most  recent med list.          ALPRAZolam 0.5 MG tablet Commonly known as:  XANAX Take 1 tablet (0.5 mg total) by mouth 3 (three) times daily.   aspirin 81 MG tablet Take 1 tablet (81 mg total) by mouth 2 (two) times daily with a meal.   Calcium Citrate-Vitamin D 315-250 MG-UNIT Tabs Take 2 tablets by mouth daily.   Ferrous Fumarate 324 (106 Fe) MG Tabs tablet Commonly known as:  HEMOCYTE - 106 mg FE Take 1 tablet (106 mg of iron total) by mouth 2 (two) times daily.   ferrous sulfate 325 (65 FE) MG tablet TAKE ONE TABLET BY MOUTH DAILY WITH BREAKFAST   isosorbide mononitrate 60 MG 24 hr tablet Commonly known as:  IMDUR TAKE ONE TABLET BY MOUTH EVERY DAY   loperamide 2 MG capsule Commonly known as:  IMODIUM TAKE ONE CAPSULE BY MOUTH FOUR TIMES DAILY AS NEEDED FOR  DIARRHEA / LOOSE STOOLS   nitroGLYCERIN 0.4 MG SL tablet Commonly known as:  NITROSTAT DISSOLVE ONE TABLET UNDER TONGUE EVERY 5 MINUTES UP TO 3 DOSES AS NEEDED FOR CHEST PAIN   traMADol 50 MG tablet Commonly known as:  ULTRAM TAKE ONE TABLET BY MOUTH EVERY TWELVE HOURS AS NEEDED.   triamcinolone ointment 0.5 % Commonly known as:  KENALOG Apply 1 application topically 2 (two) times daily.   triamterene-hydrochlorothiazide 37.5-25 MG capsule Commonly known as:  DYAZIDE TAKE ONE CAPSULE BY MOUTH 3 TIMES PER WEEK   verapamil 120 MG CR tablet Commonly known as:  CALAN-SR Take 1 tablet (120 mg total) by mouth at bedtime.      Should the current topical treatment for the scalp not be successful I encouraged the patient to check back in a dermatologist.  I have no report from dermatology suggesting that I prescribed an unknown oral medication for this problem.  Follow-up: Return in about 6 months (around 06/18/2018).  [name], M.D.

## 2017-12-19 ENCOUNTER — Encounter: Payer: Self-pay | Admitting: Cardiology

## 2017-12-19 ENCOUNTER — Ambulatory Visit (INDEPENDENT_AMBULATORY_CARE_PROVIDER_SITE_OTHER): Payer: Medicare Other | Admitting: Cardiology

## 2017-12-19 VITALS — BP 118/80 | HR 66 | Ht 63.0 in | Wt 156.0 lb

## 2017-12-19 DIAGNOSIS — I10 Essential (primary) hypertension: Secondary | ICD-10-CM | POA: Diagnosis not present

## 2017-12-19 DIAGNOSIS — I25111 Atherosclerotic heart disease of native coronary artery with angina pectoris with documented spasm: Secondary | ICD-10-CM | POA: Diagnosis not present

## 2017-12-19 NOTE — Progress Notes (Signed)
Hello Adalis,  Your lab result is normal.Some minor variations that are not significant are commonly marked abnormal, but do not represent any medical problem for you.  Best regards, Lenox Bink, M.D.

## 2017-12-19 NOTE — Patient Instructions (Signed)
Medication Instructions:  The current medical regimen is effective;  continue present plan and medications.  If you need a refill on your cardiac medications before your next appointment, please call your pharmacy.   Follow-Up: Follow up in 1 year with Dr. Hochrein.  You will receive a letter in the mail 2 months before you are due.  Please call us when you receive this letter to schedule your follow up appointment.  Thank you for choosing [facility]!!     

## 2017-12-20 ENCOUNTER — Other Ambulatory Visit: Payer: Self-pay | Admitting: Family Medicine

## 2017-12-20 LAB — URINE CULTURE

## 2017-12-20 MED ORDER — AMOXICILLIN 875 MG PO TABS: 875.0000 mg | ORAL_TABLET | Freq: Two times a day (BID) | ORAL | 0 refills | Status: AC

## 2018-02-11 DIAGNOSIS — Z1231 Encounter for screening mammogram for malignant neoplasm of breast: Secondary | ICD-10-CM | POA: Diagnosis not present

## 2018-02-11 LAB — HM MAMMOGRAPHY

## 2018-03-08 DIAGNOSIS — L298 Other pruritus: Secondary | ICD-10-CM | POA: Diagnosis not present

## 2018-04-09 ENCOUNTER — Other Ambulatory Visit: Payer: Self-pay | Admitting: Family Medicine

## 2018-04-15 ENCOUNTER — Ambulatory Visit (INDEPENDENT_AMBULATORY_CARE_PROVIDER_SITE_OTHER): Payer: Medicare Other | Admitting: Family Medicine

## 2018-04-15 ENCOUNTER — Encounter: Payer: Self-pay | Admitting: Family Medicine

## 2018-04-15 VITALS — BP 109/60 | HR 70 | Temp 96.7°F | Ht 63.0 in | Wt 148.5 lb

## 2018-04-15 DIAGNOSIS — R109 Unspecified abdominal pain: Secondary | ICD-10-CM

## 2018-04-15 DIAGNOSIS — N3090 Cystitis, unspecified without hematuria: Secondary | ICD-10-CM | POA: Diagnosis not present

## 2018-04-15 LAB — URINALYSIS
Bilirubin, UA: NEGATIVE
Glucose, UA: NEGATIVE
Ketones, UA: NEGATIVE
Nitrite, UA: NEGATIVE
Protein, UA: NEGATIVE
RBC, UA: NEGATIVE
Specific Gravity, UA: 1.015 (ref 1.005–1.030)
Urobilinogen, Ur: 0.2 mg/dL (ref 0.2–1.0)
pH, UA: 7 (ref 5.0–7.5)

## 2018-04-15 MED ORDER — AMOXICILLIN 500 MG PO CAPS: 500.0000 mg | ORAL_CAPSULE | Freq: Three times a day (TID) | ORAL | 0 refills | Status: DC

## 2018-04-15 NOTE — Progress Notes (Signed)
Chief Complaint  Patient presents with  . Flank Pain    pt here today c/o flank pain which she thinks it is UTI    HPI  Patient presents today for urinary frequency for several days. Nocturia as well. Denies fever . Moderate bilateral flank pain for three days has decreased today. No nausea, vomiting.   PMH: Smoking status noted ROS: Per HPI  Objective: BP 109/60   Pulse 70   Temp (!) 96.7 F (35.9 C) (Oral)   Ht 5\' 3"  (1.6 m)   Wt 148 lb 8 oz (67.4 kg)   BMI 26.31 kg/m  Gen: NAD, alert, cooperative with exam HEENT: NCAT, EOMI, PERRL CV: RRR, good S1/S2, no murmur Resp: CTABL, no wheezes, non-labored Abd: SNTND, BS present, no guarding or organomegaly Ext: No edema, warm Neuro: Alert and oriented, No gross deficits  Assessment and plan:  1. Flank pain   2. Cystitis     Meds ordered this encounter  Medications  . amoxicillin (AMOXIL) 500 MG capsule    Sig: Take 1 capsule (500 mg total) by mouth 3 (three) times daily.    Dispense:  21 capsule    Refill:  0    Orders Placed This Encounter  Procedures  . Urine Culture  . Urinalysis    Follow up as needed.  [name], MD

## 2018-04-17 LAB — URINE CULTURE

## 2018-04-18 ENCOUNTER — Telehealth: Payer: Self-pay | Admitting: Family Medicine

## 2018-04-18 ENCOUNTER — Other Ambulatory Visit: Payer: Self-pay | Admitting: Family Medicine

## 2018-04-18 NOTE — Telephone Encounter (Signed)
Message was took earlier this morning but due to Epic outage message is being routed to pools now ° °Rtn call for lab results °

## 2018-04-18 NOTE — Telephone Encounter (Signed)
Patient aware of culture results

## 2018-04-18 NOTE — Telephone Encounter (Signed)
I do not see on patients medication list 

## 2018-04-24 ENCOUNTER — Other Ambulatory Visit: Payer: Self-pay | Admitting: Family

## 2018-04-24 DIAGNOSIS — C801 Malignant (primary) neoplasm, unspecified: Secondary | ICD-10-CM

## 2018-04-24 DIAGNOSIS — D75839 Thrombocytosis, unspecified: Secondary | ICD-10-CM

## 2018-04-24 DIAGNOSIS — D473 Essential (hemorrhagic) thrombocythemia: Secondary | ICD-10-CM

## 2018-04-24 DIAGNOSIS — D509 Iron deficiency anemia, unspecified: Secondary | ICD-10-CM

## 2018-04-25 ENCOUNTER — Inpatient Hospital Stay: Payer: Medicare Other | Attending: Family | Admitting: Family

## 2018-04-25 ENCOUNTER — Encounter: Payer: Self-pay | Admitting: Family

## 2018-04-25 ENCOUNTER — Other Ambulatory Visit: Payer: Self-pay

## 2018-04-25 ENCOUNTER — Inpatient Hospital Stay: Payer: Medicare Other

## 2018-04-25 VITALS — BP 163/76 | HR 58 | Temp 97.6°F | Resp 19 | Wt 152.0 lb

## 2018-04-25 DIAGNOSIS — D473 Essential (hemorrhagic) thrombocythemia: Secondary | ICD-10-CM

## 2018-04-25 DIAGNOSIS — D75839 Thrombocytosis, unspecified: Secondary | ICD-10-CM

## 2018-04-25 DIAGNOSIS — Z859 Personal history of malignant neoplasm, unspecified: Secondary | ICD-10-CM | POA: Diagnosis not present

## 2018-04-25 DIAGNOSIS — D509 Iron deficiency anemia, unspecified: Secondary | ICD-10-CM

## 2018-04-25 LAB — CBC WITH DIFFERENTIAL (CANCER CENTER ONLY)
Abs Immature Granulocytes: 0.01 10*3/uL (ref 0.00–0.07)
Basophils Absolute: 0.1 10*3/uL (ref 0.0–0.1)
Basophils Relative: 1 %
Eosinophils Absolute: 0.3 10*3/uL (ref 0.0–0.5)
Eosinophils Relative: 5 %
HCT: 34.2 % — ABNORMAL LOW (ref 36.0–46.0)
Hemoglobin: 10.9 g/dL — ABNORMAL LOW (ref 12.0–15.0)
Immature Granulocytes: 0 %
Lymphocytes Relative: 36 %
Lymphs Abs: 1.9 10*3/uL (ref 0.7–4.0)
MCH: 30.8 pg (ref 26.0–34.0)
MCHC: 31.9 g/dL (ref 30.0–36.0)
MCV: 96.6 fL (ref 80.0–100.0)
Monocytes Absolute: 0.5 10*3/uL (ref 0.1–1.0)
Monocytes Relative: 10 %
Neutro Abs: 2.6 10*3/uL (ref 1.7–7.7)
Neutrophils Relative %: 48 %
Platelet Count: 364 10*3/uL (ref 150–400)
RBC: 3.54 MIL/uL — ABNORMAL LOW (ref 3.87–5.11)
RDW: 13.5 % (ref 11.5–15.5)
WBC Count: 5.3 10*3/uL (ref 4.0–10.5)
nRBC: 0 % (ref 0.0–0.2)

## 2018-04-25 LAB — CMP (CANCER CENTER ONLY)
ALT: 10 U/L (ref 0–44)
AST: 12 U/L — ABNORMAL LOW (ref 15–41)
Albumin: 4.4 g/dL (ref 3.5–5.0)
Alkaline Phosphatase: 90 U/L (ref 38–126)
Anion gap: 6 (ref 5–15)
BUN: 12 mg/dL (ref 8–23)
CO2: 31 mmol/L (ref 22–32)
Calcium: 9.6 mg/dL (ref 8.9–10.3)
Chloride: 103 mmol/L (ref 98–111)
Creatinine: 0.71 mg/dL (ref 0.44–1.00)
GFR, Est AFR Am: >60 mL/min (ref >60)
GFR, Estimated: >60 mL/min (ref >60)
Glucose, Bld: 97 mg/dL (ref 70–99)
Potassium: 4.9 mmol/L (ref 3.5–5.1)
Sodium: 140 mmol/L (ref 135–145)
Total Bilirubin: 0.4 mg/dL (ref 0.3–1.2)
Total Protein: 6.9 g/dL (ref 6.5–8.1)

## 2018-04-25 NOTE — Progress Notes (Signed)
Hematology and Oncology Follow Up Visit  Amy Black [id number] August 28, 1940 78 y.o. 04/25/2018   Principle Diagnosis:  Transient thrombocytosis History transverse myelitis History of adenosquamous carcinoma of unknown primary  Current Therapy:   Observation   Interim History:  Amy Black is here today for follow-up. She was treated last week for a UTI with Amoxicillin. She has completed Amy Black antibiotic and is feeling much better.  No fever, chills, n/v, cough, rash, dizziness, SOB, chest pain, palpitations, abdominal pain or changes in bowel or bladder habits.  No episodes of bleeding. No bruising or petechiae.  She has puffiness in Amy Black ankles that comes and goes. She states that this is unchanged.  No tenderness, numbness or tingling in Amy Black extremities at this time.   No falls or syncopal episodes to report.  No lymphadenopathy noted on exam.  She has twinges of stinging pain in Amy Black left back that comes and go. This is unchanged. She states that she had this for over a year and Amy Black PCP is aware. She is eating well and staying hydrated. Amy Black weight is stable.    ECOG Performance Status: 1 - Symptomatic but completely ambulatory  Medications:  Allergies as of 04/25/2018      Reactions   Iodine Itching   Amlodipine Other (See Comments)   headaches   Lexapro [escitalopram Oxalate] Other (See Comments)   Drunk, High feeling   Cymbalta [duloxetine Hcl] Other (See Comments)   sleepiness   Gabapentin Other (See Comments)   sleepiness   Ivp Dye [iodinated Diagnostic Agents] Itching   Zanaflex [tizanidine Hcl] Other (See Comments)   Very drunk feeling next day      Medication List       Accurate as of April 25, 2018  1:27 PM. Always use your most recent med list.        ALPRAZolam 0.5 MG tablet Commonly known as:  XANAX Take 1 tablet (0.5 mg total) by mouth 3 (three) times daily.   amoxicillin 500 MG capsule Commonly known as:  AMOXIL Take 1 capsule (500 mg total) by  mouth 3 (three) times daily.   aspirin EC 81 MG tablet Take 81 mg by mouth daily.   Calcium Citrate-Vitamin D 315-250 MG-UNIT Tabs Commonly known as:  Calcium Citrate + D3 Take 2 tablets by mouth daily.   cyclobenzaprine 5 MG tablet Commonly known as:  FLEXERIL TAKE ONE TABLET BY MOUTH TWICE DAILY FOR 14 DAYS   Ferrex 150 150 MG capsule Generic drug:  iron polysaccharides TAKE ONE CAPSULE BY MOUTH ONCE DAILY   ferrous sulfate 325 (65 FE) MG tablet TAKE ONE TABLET BY MOUTH DAILY WITH BREAKFAST   isosorbide mononitrate 60 MG 24 hr tablet Commonly known as:  IMDUR TAKE ONE TABLET BY MOUTH EVERY DAY   loperamide 2 MG capsule Commonly known as:  IMODIUM TAKE ONE CAPSULE BY MOUTH FOUR TIMES DAILY AS NEEDED FOR DIARRHEA / LOOSE STOOLS   nitroGLYCERIN 0.4 MG SL tablet Commonly known as:  Nitrostat DISSOLVE ONE TABLET UNDER TONGUE EVERY 5 MINUTES UP TO 3 DOSES AS NEEDED FOR CHEST PAIN   traMADol 50 MG tablet Commonly known as:  ULTRAM TAKE ONE TABLET BY MOUTH EVERY TWELVE HOURS AS NEEDED.   triamterene-hydrochlorothiazide 37.5-25 MG capsule Commonly known as:  DYAZIDE TAKE ONE CAPSULE BY MOUTH 3 TIMES PER WEEK   verapamil 120 MG CR tablet Commonly known as:  CALAN-SR Take 1 tablet (120 mg total) by mouth at bedtime.  Allergies:  Allergies  Allergen Reactions  . Iodine Itching  . Amlodipine Other (See Comments)    headaches  . Lexapro [Escitalopram Oxalate] Other (See Comments)    Drunk, High feeling  . Cymbalta [Duloxetine Hcl] Other (See Comments)    sleepiness  . Gabapentin Other (See Comments)    sleepiness  . Ivp Dye [Iodinated Diagnostic Agents] Itching  . Zanaflex [Tizanidine Hcl] Other (See Comments)    Very drunk feeling next day    Past Medical History, Surgical history, Social history, and Family History were reviewed and updated.  Review of Systems: All other 10 point review of systems is negative.   Physical Exam:  weight is 152 lb (68.9  kg). Amy Black oral temperature is 97.6 F (36.4 C). Amy Black blood pressure is 163/76 (abnormal) and Amy Black pulse is 58 (abnormal). Amy Black respiration is 19 and oxygen saturation is 100%.   Wt Readings from Last 3 Encounters:  04/25/18 152 lb (68.9 kg)  04/15/18 148 lb 8 oz (67.4 kg)  12/19/17 156 lb (70.8 kg)    Ocular: Sclerae unicteric, pupils equal, round and reactive to light Ear-nose-throat: Oropharynx clear, dentition fair Lymphatic: No cervical, supraclavicular or axillary adenopathy Lungs no rales or rhonchi, good excursion bilaterally Heart regular rate and rhythm, no murmur appreciated Abd soft, nontender, positive bowel sounds, no liver or spleen tip palpated on exam, no fluid wave  MSK no focal spinal tenderness, no joint edema Neuro: non-focal, well-oriented, appropriate affect Breasts: Deferred   Lab Results  Component Value Date   WBC 5.3 04/25/2018   HGB 10.9 (L) 04/25/2018   HCT 34.2 (L) 04/25/2018   MCV 96.6 04/25/2018   PLT 364 04/25/2018   Lab Results  Component Value Date   FERRITIN 103 10/03/2017   IRON 74 10/03/2017   TIBC 257 10/03/2017   UIBC 183 10/03/2017   IRONPCTSAT 29 10/03/2017   Lab Results  Component Value Date   RETICCTPCT 1.6 10/03/2017   RBC 3.54 (L) 04/25/2018   No results found for: KPAFRELGTCHN, LAMBDASER, KAPLAMBRATIO No results found for: IGGSERUM, IGA, IGMSERUM No results found for: Odetta Pink, SPEI   Chemistry      Component Value Date/Time   NA 139 12/18/2017 1030   NA 138 01/28/2016 1303   K 4.7 12/18/2017 1030   K 4.2 07/28/2016 1243   K 4.2 01/28/2016 1303   CL 100 12/18/2017 1030   CL 98 07/28/2016 1243   CL 101 07/20/2014 1131   CO2 24 12/18/2017 1030   CO2 29 07/28/2016 1243   CO2 30 (H) 01/28/2016 1303   BUN 12 12/18/2017 1030   BUN 10.0 01/28/2016 1303   CREATININE 0.88 12/18/2017 1030   CREATININE 0.94 07/28/2016 1243   CREATININE 0.8 01/28/2016 1303       Component Value Date/Time   CALCIUM 9.8 12/18/2017 1030   CALCIUM 9.9 07/28/2016 1243   CALCIUM 10.0 01/28/2016 1303   ALKPHOS 114 12/18/2017 1030   ALKPHOS 105 07/28/2016 1243   ALKPHOS 123 01/28/2016 1303   AST 13 12/18/2017 1030   AST 12 07/28/2016 1243   AST 13 01/28/2016 1303   ALT 9 12/18/2017 1030   ALT 9 07/28/2016 1243   ALT 12 01/28/2016 1303   BILITOT 0.5 12/18/2017 1030   BILITOT 0.51 01/28/2016 1303       Impression and Plan: Amy Black is a very pleasant 78 yo African American female with history of transient thrombocytosis and also history of adenosquamous  carcinoma with unknown primary. So far, there is no evidence of recurrence.  Platelet count is stable at 364, Hgb stable at 10.9 and WBC count 5.3.  We will plan to see Amy Black back in another 6 months.  She will contact our office with any questions or concerns. We can certainly see Amy Black sooner if need be.   Laverna Peace, NP 3/19/20201:27 PM

## 2018-04-26 ENCOUNTER — Telehealth: Payer: Self-pay | Admitting: Family

## 2018-04-26 LAB — IRON AND TIBC
Iron: 71 ug/dL (ref 41–142)
Saturation Ratios: 30 % (ref 21–57)
TIBC: 239 ug/dL (ref 236–444)
UIBC: 168 ug/dL (ref 120–384)

## 2018-04-26 LAB — FERRITIN: Ferritin: 74 ng/mL (ref 11–307)

## 2018-04-26 NOTE — Telephone Encounter (Signed)
Appointments scheduled letter/calendar mailed per 3/19 los

## 2018-05-18 ENCOUNTER — Other Ambulatory Visit: Payer: Self-pay | Admitting: Family Medicine

## 2018-05-20 NOTE — Telephone Encounter (Signed)
Forwarding for his approval

## 2018-05-20 NOTE — Telephone Encounter (Signed)
Last seen 04/15/2018 Dr Livia Snellen

## 2018-05-23 ENCOUNTER — Telehealth: Payer: Self-pay | Admitting: Family Medicine

## 2018-05-23 ENCOUNTER — Telehealth: Payer: Self-pay | Admitting: *Deleted

## 2018-05-23 NOTE — Telephone Encounter (Signed)
Call received from patient stating that she is having stinging and itching across her mid back for the last 4-5 months and would like to know if she can have a MRI done.  Jory Ee NP notified and order received for patient to contact her PCP regarding back issues.  Patient notified of orders per NP and appreciative of call back.

## 2018-05-23 NOTE — Telephone Encounter (Signed)
Please refer as pt requests. DX lumbar radiculopathy

## 2018-05-24 ENCOUNTER — Other Ambulatory Visit: Payer: Self-pay | Admitting: *Deleted

## 2018-05-24 DIAGNOSIS — M5416 Radiculopathy, lumbar region: Secondary | ICD-10-CM

## 2018-05-24 NOTE — Telephone Encounter (Signed)
Left message to please call our office to confirm you are aware of MRI ordered.

## 2018-06-05 ENCOUNTER — Other Ambulatory Visit: Payer: Self-pay

## 2018-06-05 ENCOUNTER — Other Ambulatory Visit: Payer: Self-pay | Admitting: Family Medicine

## 2018-06-05 ENCOUNTER — Ambulatory Visit
Admission: RE | Admit: 2018-06-05 | Discharge: 2018-06-05 | Disposition: A | Discharge status: Home / Self Care | Payer: Medicare Other | Source: Ambulatory Visit | Attending: Family Medicine | Admitting: Family Medicine

## 2018-06-05 ENCOUNTER — Ambulatory Visit: Payer: Medicare Other | Admitting: Cardiology

## 2018-06-05 DIAGNOSIS — M48061 Spinal stenosis, lumbar region without neurogenic claudication: Secondary | ICD-10-CM | POA: Diagnosis not present

## 2018-06-05 DIAGNOSIS — M5416 Radiculopathy, lumbar region: Secondary | ICD-10-CM

## 2018-06-05 MED ORDER — CELECOXIB 200 MG PO CAPS: 200.0000 mg | ORAL_CAPSULE | Freq: Every day | ORAL | 5 refills | Status: DC

## 2018-06-06 ENCOUNTER — Other Ambulatory Visit: Payer: Self-pay | Admitting: Cardiology

## 2018-06-06 ENCOUNTER — Telehealth: Payer: Self-pay | Admitting: Family Medicine

## 2018-06-06 ENCOUNTER — Other Ambulatory Visit: Payer: Self-pay | Admitting: Family Medicine

## 2018-06-06 DIAGNOSIS — I208 Other forms of angina pectoris: Secondary | ICD-10-CM

## 2018-06-06 NOTE — Telephone Encounter (Signed)
I need to know why she can't  Take it in order to figure out what she can take. Please call and ask.

## 2018-06-07 NOTE — Telephone Encounter (Signed)
Attempted to contact patient - NA °

## 2018-06-10 ENCOUNTER — Telehealth: Payer: Self-pay | Admitting: Family Medicine

## 2018-06-10 NOTE — Telephone Encounter (Signed)
PT states that she has took the celecoxib (CELEBREX) 200 MG capsule before and is not going to take it again, she took it back to the drug store and wants to know if Dr Livia Snellen could send her something else in   [facility]

## 2018-06-11 ENCOUNTER — Other Ambulatory Visit: Payer: Self-pay | Admitting: Family Medicine

## 2018-06-11 MED ORDER — NABUMETONE 500 MG PO TABS: 500.0000 mg | ORAL_TABLET | Freq: Two times a day (BID) | ORAL | 1 refills | Status: DC

## 2018-06-11 NOTE — Telephone Encounter (Signed)
I sent in the requested prescription 

## 2018-06-11 NOTE — Telephone Encounter (Signed)
Patient aware of medication change. 

## 2018-06-18 ENCOUNTER — Ambulatory Visit: Payer: Medicare Other | Admitting: Family Medicine

## 2018-06-18 ENCOUNTER — Other Ambulatory Visit: Payer: Self-pay | Admitting: Family Medicine

## 2018-06-18 ENCOUNTER — Other Ambulatory Visit: Payer: Self-pay

## 2018-06-18 ENCOUNTER — Ambulatory Visit (INDEPENDENT_AMBULATORY_CARE_PROVIDER_SITE_OTHER): Payer: Medicare Other | Admitting: Family Medicine

## 2018-06-18 ENCOUNTER — Encounter: Payer: Self-pay | Admitting: Family Medicine

## 2018-06-18 VITALS — BP 130/73 | HR 59 | Temp 97.5°F | Ht 63.0 in | Wt 150.0 lb

## 2018-06-18 DIAGNOSIS — E1169 Type 2 diabetes mellitus with other specified complication: Secondary | ICD-10-CM

## 2018-06-18 DIAGNOSIS — I10 Essential (primary) hypertension: Secondary | ICD-10-CM

## 2018-06-18 DIAGNOSIS — G373 Acute transverse myelitis in demyelinating disease of central nervous system: Secondary | ICD-10-CM

## 2018-06-18 DIAGNOSIS — Z79891 Long term (current) use of opiate analgesic: Secondary | ICD-10-CM | POA: Diagnosis not present

## 2018-06-18 DIAGNOSIS — R52 Pain, unspecified: Secondary | ICD-10-CM

## 2018-06-18 DIAGNOSIS — I208 Other forms of angina pectoris: Secondary | ICD-10-CM | POA: Diagnosis not present

## 2018-06-18 DIAGNOSIS — M47812 Spondylosis without myelopathy or radiculopathy, cervical region: Secondary | ICD-10-CM

## 2018-06-18 DIAGNOSIS — E785 Hyperlipidemia, unspecified: Secondary | ICD-10-CM | POA: Diagnosis not present

## 2018-06-18 DIAGNOSIS — I2089 Other forms of angina pectoris: Secondary | ICD-10-CM

## 2018-06-18 MED ORDER — TRAMADOL HCL 50 MG PO TABS: ORAL_TABLET | ORAL | 5 refills | Status: DC

## 2018-06-18 MED ORDER — TRIAMTERENE-HCTZ 37.5-25 MG PO CAPS: ORAL_CAPSULE | ORAL | 5 refills | Status: DC

## 2018-06-18 MED ORDER — ALPRAZOLAM 0.5 MG PO TABS: 0.5000 mg | ORAL_TABLET | Freq: Three times a day (TID) | ORAL | 2 refills | Status: DC

## 2018-06-18 MED ORDER — ISOSORBIDE MONONITRATE ER 60 MG PO TB24: 60.0000 mg | ORAL_TABLET | Freq: Every day | ORAL | 1 refills | Status: DC

## 2018-06-18 NOTE — Progress Notes (Signed)
Subjective:  Patient ID: Amy Black, female    DOB: January 23, 1941  Age: 78 y.o. MRN: [id number]  CC: Medication Refill   HPI Amy Black presents for Follow-up of chronic pain.  She takes occasional tramadol.  Review of PDMP shows that she is not actually even taking the total dose offered.  It looks like she is taking about half.  And this is confirmed by what she tells me today.  She does have some chronic arthritic back pain.  Additionally she suffers from anxiety and has been a longtime user of Xanax.  She is taking that 3 times a day every day.  PDMP shows that she is filling that routinely close to the 30 days interval allowed.  Her pain generates from transverse myelitis.  It is mild to moderate usually.  Relief is adequate with the tramadol.  She does get nervous anxious and restless.  She says that this is exacerbated by her worries about the COVID virus.   Follow-up of hypertension. Patient has no history of headache chest pain or shortness of breath or recent cough. Patient also denies symptoms of TIA such as numbness weakness lateralizing. Patient checks  blood pressure at home and has not had any elevated readings recently. Patient denies side effects from his medication. States taking it regularly. Of note is that she is currently not having chest pain but she does take isosorbide for control of that potential angina.  She has not used her sublingual nitroglycerin recently.   Depression screen [facility] 2/9 06/18/2018 04/15/2018 12/18/2017  Decreased Interest 0 0 0  Down, Depressed, Hopeless 0 0 0  PHQ - 2 Score 0 0 0  Altered sleeping 0 - -  Tired, decreased energy 0 - -  Change in appetite 0 - -  Feeling bad or failure about yourself  0 - -  Trouble concentrating 0 - -  Moving slowly or fidgety/restless 0 - -  Suicidal thoughts 0 - -  PHQ-9 Score 0 - -  Some recent data might be hidden    History Amy Black has a past medical history of Adenocarcinoma ([place]), Adenocarcinoma  of unknown primary ([place]) (02/06/2011), Adenosquamous carcinoma, Allergy, Anxiety, CAD (coronary artery disease), Cataract, Coronary artery spasm ([place]), Diverticulosis of colon (without mention of hemorrhage), GERD (gastroesophageal reflux disease), hepatic cyst, Hip fracture, right ([place]), History of cardiac catheterization, History of nuclear stress test (04/2011), HTN (hypertension), Hyperlipidemia, Insomnia, Irritable bowel syndrome, Transverse myelitis ([place]), and Vitamin D deficiency.   She has a past surgical history that includes Resection soft tissue tumor leg / ankle radical (2004); Diagnostic laparoscopy; Lysis of adhesion; Salpingoophorectomy (Left); Pelvic laparoscopy (1992); Cardiac catheterization; transthoracic echocardiogram (07/2012); Cardiac catheterization (N/A, 08/28/2014); Colonoscopy; Upper gastrointestinal endoscopy; Total hip arthroplasty (Right, 10/04/2015); and Abdominal hysterectomy (Bilateral).   Her family history includes Bladder Cancer in her mother; Coronary artery disease in her unknown relative; Depression in her sister; Diabetes in her brother and unknown relative; Heart disease in her father and mother; Hyperlipidemia in her sister; Hypertension in her brother and mother; Seizures in her brother; Stroke in her father.She reports that she quit smoking about 45 years ago. Her smoking use included cigarettes. She started smoking about 49 years ago. She has a 2.00 pack-year smoking history. She has never used smokeless tobacco. She reports that she does not drink alcohol or use drugs.    ROS Review of Systems  Constitutional: Negative.   HENT: Negative for congestion.   Eyes: Negative for visual disturbance.  Respiratory: Negative for shortness of breath.   Cardiovascular: Negative for chest pain.  Gastrointestinal: Negative for abdominal pain, constipation, diarrhea, nausea and vomiting.  Genitourinary: Negative for difficulty urinating.  Musculoskeletal: Positive for  arthralgias, back pain and neck pain. Negative for myalgias.  Neurological: Negative for headaches.  Psychiatric/Behavioral: Positive for sleep disturbance. The patient is nervous/anxious and is hyperactive.     Objective:  BP 130/73   Pulse (!) 59   Temp (!) 97.5 F (36.4 C) (Oral)   Ht 5' 3" (1.6 m)   Wt 150 lb (68 kg)   BMI 26.57 kg/m   BP Readings from Last 3 Encounters:  06/18/18 130/73  04/25/18 (!) 163/76  04/15/18 109/60    Wt Readings from Last 3 Encounters:  06/18/18 150 lb (68 kg)  04/25/18 152 lb (68.9 kg)  04/15/18 148 lb 8 oz (67.4 kg)     Physical Exam Constitutional:      General: She is not in acute distress.    Appearance: She is well-developed.  Cardiovascular:     Rate and Rhythm: Normal rate and regular rhythm.  Pulmonary:     Breath sounds: Normal breath sounds.  Skin:    General: Skin is warm and dry.  Neurological:     Mental Status: She is alert and oriented to person, place, and time.       Assessment & Plan:   Amy Black was seen today for medication refill.  Diagnoses and all orders for this visit:  Pain management -     ToxASSURE Select 52 (MW), Urine  Essential hypertension -     CBC with Differential/Platelet -     CMP14+EGFR  Hyperlipidemia associated with type 2 diabetes mellitus (HCC) -     CMP14+EGFR -     Lipid panel  Exertional angina (HCC) -     isosorbide mononitrate (IMDUR) 60 MG 24 hr tablet; Take 1 tablet (60 mg total) by mouth daily.  Arthritis of neck  Transverse myelitis ([place])  Other orders -     ALPRAZolam (XANAX) 0.5 MG tablet; Take 1 tablet (0.5 mg total) by mouth 3 (three) times daily. -     traMADol (ULTRAM) 50 MG tablet; TAKE ONE TABLET BY MOUTH EVERY TWELVE HOURS AS NEEDED. -     triamterene-hydrochlorothiazide (DYAZIDE) 37.5-25 MG capsule; TAKE ONE CAPSULE BY MOUTH 3 TIMES PER WEEK       I have discontinued Lorenz Coaster. Gatt's ferrous sulfate. I have also changed her ALPRAZolam and  isosorbide mononitrate. Additionally, I am having her maintain her Calcium Citrate-Vitamin D, nitroGLYCERIN, loperamide, aspirin EC, Ferrex 150, cyclobenzaprine, verapamil, nabumetone, traMADol, and triamterene-hydrochlorothiazide.  Allergies as of 06/18/2018      Reactions   Iodine Itching   Amlodipine Other (See Comments)   headaches   Lexapro [escitalopram Oxalate] Other (See Comments)   Drunk, High feeling   Cymbalta [duloxetine Hcl] Other (See Comments)   sleepiness   Gabapentin Other (See Comments)   sleepiness   Ivp Dye [iodinated Diagnostic Agents] Itching   Zanaflex [tizanidine Hcl] Other (See Comments)   Very drunk feeling next day      Medication List       Accurate as of Jun 18, 2018  5:15 PM. If you have any questions, ask your nurse or doctor.        STOP taking these medications   ferrous sulfate 325 (65 FE) MG tablet Stopped by:  [name], MD     TAKE these medications   ALPRAZolam  0.5 MG tablet Commonly known as:  XANAX Take 1 tablet (0.5 mg total) by mouth 3 (three) times daily.   aspirin EC 81 MG tablet Take 81 mg by mouth daily.   Calcium Citrate-Vitamin D 315-250 MG-UNIT Tabs Commonly known as:  Calcium Citrate + D3 Take 2 tablets by mouth daily.   cyclobenzaprine 5 MG tablet Commonly known as:  FLEXERIL TAKE ONE TABLET BY MOUTH TWICE DAILY FOR 14 DAYS   Ferrex 150 150 MG capsule Generic drug:  iron polysaccharides TAKE ONE CAPSULE BY MOUTH ONCE DAILY   isosorbide mononitrate 60 MG 24 hr tablet Commonly known as:  IMDUR Take 1 tablet (60 mg total) by mouth daily.   loperamide 2 MG capsule Commonly known as:  IMODIUM TAKE ONE CAPSULE BY MOUTH FOUR TIMES DAILY AS NEEDED FOR DIARRHEA / LOOSE STOOLS   nabumetone 500 MG tablet Commonly known as:  RELAFEN Take 1 tablet (500 mg total) by mouth 2 (two) times daily. For muscle and joint pain   nitroGLYCERIN 0.4 MG SL tablet Commonly known as:  Nitrostat DISSOLVE ONE TABLET UNDER TONGUE  EVERY 5 MINUTES UP TO 3 DOSES AS NEEDED FOR CHEST PAIN   traMADol 50 MG tablet Commonly known as:  ULTRAM TAKE ONE TABLET BY MOUTH EVERY TWELVE HOURS AS NEEDED.   triamterene-hydrochlorothiazide 37.5-25 MG capsule Commonly known as:  DYAZIDE TAKE ONE CAPSULE BY MOUTH 3 TIMES PER WEEK   verapamil 120 MG CR tablet Commonly known as:  CALAN-SR TAKE ONE TABLET BY MOUTH AT BEDTIME        Follow-up: Return in about 3 months (around 09/18/2018).  [name], M.D.

## 2018-06-19 LAB — LIPID PANEL
Chol/HDL Ratio: 2.6 ratio (ref 0.0–4.4)
Cholesterol, Total: 192 mg/dL (ref 100–199)
HDL: 75 mg/dL (ref >39)
LDL Calculated: 94 mg/dL (ref 0–99)
Triglycerides: 116 mg/dL (ref 0–149)
VLDL Cholesterol Cal: 23 mg/dL (ref 5–40)

## 2018-06-19 LAB — CBC WITH DIFFERENTIAL/PLATELET
Basophils Absolute: 0.1 10*3/uL (ref 0.0–0.2)
Basos: 1 %
EOS (ABSOLUTE): 0.1 10*3/uL (ref 0.0–0.4)
Eos: 2 %
Hematocrit: 34.7 % (ref 34.0–46.6)
Hemoglobin: 11.6 g/dL (ref 11.1–15.9)
Immature Grans (Abs): 0 10*3/uL (ref 0.0–0.1)
Immature Granulocytes: 0 %
Lymphocytes Absolute: 2.7 10*3/uL (ref 0.7–3.1)
Lymphs: 41 %
MCH: 31.1 pg (ref 26.6–33.0)
MCHC: 33.4 g/dL (ref 31.5–35.7)
MCV: 93 fL (ref 79–97)
Monocytes Absolute: 0.5 10*3/uL (ref 0.1–0.9)
Monocytes: 8 %
Neutrophils Absolute: 3.2 10*3/uL (ref 1.4–7.0)
Neutrophils: 48 %
Platelets: 412 10*3/uL (ref 150–450)
RBC: 3.73 x10E6/uL — ABNORMAL LOW (ref 3.77–5.28)
RDW: 12.8 % (ref 11.7–15.4)
WBC: 6.7 10*3/uL (ref 3.4–10.8)

## 2018-06-19 LAB — CMP14+EGFR
ALT: 12 IU/L (ref 0–32)
AST: 12 IU/L (ref 0–40)
Albumin/Globulin Ratio: 2 (ref 1.2–2.2)
Albumin: 4.7 g/dL (ref 3.7–4.7)
Alkaline Phosphatase: 108 IU/L (ref 39–117)
BUN/Creatinine Ratio: 15 (ref 12–28)
BUN: 14 mg/dL (ref 8–27)
Bilirubin Total: <0.2 mg/dL (ref 0.0–1.2)
CO2: 25 mmol/L (ref 20–29)
Calcium: 10 mg/dL (ref 8.7–10.3)
Chloride: 101 mmol/L (ref 96–106)
Creatinine, Ser: 0.92 mg/dL (ref 0.57–1.00)
GFR calc Af Amer: 70 mL/min/{1.73_m2} (ref >59)
GFR calc non Af Amer: 61 mL/min/{1.73_m2} (ref >59)
Globulin, Total: 2.3 g/dL (ref 1.5–4.5)
Glucose: 92 mg/dL (ref 65–99)
Potassium: 5.2 mmol/L (ref 3.5–5.2)
Sodium: 139 mmol/L (ref 134–144)
Total Protein: 7 g/dL (ref 6.0–8.5)

## 2018-06-19 NOTE — Telephone Encounter (Signed)
Left message to please call our office.  Why can't you take celebrex?

## 2018-06-19 NOTE — Telephone Encounter (Signed)
rx was sent in yesterday at pt OV per [facility] notes.

## 2018-06-19 NOTE — Progress Notes (Signed)
Hello Ronnette,  Your lab result is normal.Some minor variations that are not significant are commonly marked abnormal, but do not represent any medical problem for you.  Best regards, [name], M.D.

## 2018-06-21 DIAGNOSIS — H35372 Puckering of macula, left eye: Secondary | ICD-10-CM | POA: Diagnosis not present

## 2018-06-21 LAB — TOXASSURE SELECT 13 (MW), URINE

## 2018-08-15 ENCOUNTER — Other Ambulatory Visit: Payer: Self-pay | Admitting: Family Medicine

## 2018-08-21 ENCOUNTER — Ambulatory Visit (INDEPENDENT_AMBULATORY_CARE_PROVIDER_SITE_OTHER): Payer: Medicare Other | Admitting: Family Medicine

## 2018-08-21 ENCOUNTER — Other Ambulatory Visit: Payer: Self-pay

## 2018-08-21 DIAGNOSIS — N3000 Acute cystitis without hematuria: Secondary | ICD-10-CM | POA: Diagnosis not present

## 2018-08-21 DIAGNOSIS — R3915 Urgency of urination: Secondary | ICD-10-CM | POA: Diagnosis not present

## 2018-08-21 LAB — URINALYSIS, COMPLETE
Bilirubin, UA: NEGATIVE
Glucose, UA: NEGATIVE
Ketones, UA: NEGATIVE
Nitrite, UA: POSITIVE — AB
Protein,UA: NEGATIVE
Specific Gravity, UA: 1.015 (ref 1.005–1.030)
Urobilinogen, Ur: 0.2 mg/dL (ref 0.2–1.0)
pH, UA: 6 (ref 5.0–7.5)

## 2018-08-21 LAB — MICROSCOPIC EXAMINATION: WBC, UA: >30 /hpf — AB (ref 0–5)

## 2018-08-21 MED ORDER — CEPHALEXIN 500 MG PO CAPS: 500.0000 mg | ORAL_CAPSULE | Freq: Two times a day (BID) | ORAL | 0 refills | Status: AC

## 2018-08-21 NOTE — Patient Instructions (Signed)
Urinary Tract Infection, Adult A urinary tract infection (UTI) is an infection of any part of the urinary tract. The urinary tract includes:  The kidneys.  The ureters.  The bladder.  The urethra. These organs make, store, and get rid of pee (urine) in the body. What are the causes? This is caused by germs (bacteria) in your genital area. These germs grow and cause swelling (inflammation) of your urinary tract. What increases the risk? You are more likely to develop this condition if:  You have a small, thin tube (catheter) to drain pee.  You cannot control when you pee or poop (incontinence).  You are female, and: ? You use these methods to prevent pregnancy: ? A medicine that kills sperm (spermicide). ? A device that blocks sperm (diaphragm). ? You have low levels of a female hormone (estrogen). ? You are pregnant.  You have genes that add to your risk.  You are sexually active.  You take antibiotic medicines.  You have trouble peeing because of: ? A prostate that is bigger than normal, if you are female. ? A blockage in the part of your body that drains pee from the bladder (urethra). ? A kidney stone. ? A nerve condition that affects your bladder (neurogenic bladder). ? Not getting enough to drink. ? Not peeing often enough.  You have other conditions, such as: ? Diabetes. ? A weak disease-fighting system (immune system). ? Sickle cell disease. ? Gout. ? Injury of the spine. What are the signs or symptoms? Symptoms of this condition include:  Needing to pee right away (urgently).  Peeing often.  Peeing small amounts often.  Pain or burning when peeing.  Blood in the pee.  Pee that smells bad or not like normal.  Trouble peeing.  Pee that is cloudy.  Fluid coming from the vagina, if you are female.  Pain in the belly or lower back. Other symptoms include:  Throwing up (vomiting).  No urge to eat.  Feeling mixed up (confused).  Being tired  and grouchy (irritable).  A fever.  Watery poop (diarrhea). How is this treated? This condition may be treated with:  Antibiotic medicine.  Other medicines.  Drinking enough water. Follow these instructions at home:  Medicines  Take over-the-counter and prescription medicines only as told by your doctor.  If you were prescribed an antibiotic medicine, take it as told by your doctor. Do not stop taking it even if you start to feel better. General instructions  Make sure you: ? Pee until your bladder is empty. ? Do not hold pee for a long time. ? Empty your bladder after sex. ? Wipe from front to back after pooping if you are a female. Use each tissue one time when you wipe.  Drink enough fluid to keep your pee pale yellow.  Keep all follow-up visits as told by your doctor. This is important. Contact a doctor if:  You do not get better after 1-2 days.  Your symptoms go away and then come back. Get help right away if:  You have very bad back pain.  You have very bad pain in your lower belly.  You have a fever.  You are sick to your stomach (nauseous).  You are throwing up. Summary  A urinary tract infection (UTI) is an infection of any part of the urinary tract.  This condition is caused by germs in your genital area.  There are many risk factors for a UTI. These include having a small, thin   tube to drain pee and not being able to control when you pee or poop.  Treatment includes antibiotic medicines for germs.  Drink enough fluid to keep your pee pale yellow. This information is not intended to replace advice given to you by your health care provider. Make sure you discuss any questions you have with your health care provider. Document Released: 07/12/2007 Document Revised: 01/10/2018 Document Reviewed: 08/02/2017 Elsevier Patient Education  2020 [employer]  

## 2018-08-21 NOTE — Progress Notes (Signed)
Telephone visit  Subjective: CC: ?UTI PCP: [name], MD [id number] P Amy Black is a 78 y.o. female calls for telephone consult today. Patient provides verbal consent for consult held via phone.  Location of patient: [facility] clinic Location of provider: Working remotely from home Others present for call: none  1. UTI Patient reports a couple day history of urinary urgency and incontinence.  She notes some mild low back pain as well.  Denies any hematuria, dysuria, nausea, vomiting or fevers.  No abdominal pain.   ROS: Per HPI  Allergies  Allergen Reactions  . Iodine Itching  . Amlodipine Other (See Comments)    headaches  . Lexapro [Escitalopram Oxalate] Other (See Comments)    Drunk, High feeling  . Cymbalta [Duloxetine Hcl] Other (See Comments)    sleepiness  . Gabapentin Other (See Comments)    sleepiness  . Ivp Dye [Iodinated Diagnostic Agents] Itching  . Zanaflex [Tizanidine Hcl] Other (See Comments)    Very drunk feeling next day   Past Medical History:  Diagnosis Date  . Adenocarcinoma (HCC)    LEFT LEG  . Adenocarcinoma of unknown primary ([place]) 02/06/2011  . 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 Allergy   . Anxiety   . CAD (coronary artery disease)    a. minimal CAD by cath in 2016 with presumed spasm (20% mLAD, 20% D2).  . Cataract    bilateral cataracts removed  . Coronary artery spasm ([place])   . Diverticulosis of colon (without mention of hemorrhage)   . GERD (gastroesophageal reflux disease)   . hepatic cyst   . Hip fracture, right ([place])   . History of cardiac catheterization   . History of nuclear stress test 04/2011   lexiscan; normal study, no significant ischemia, low risk; 2015 - normal  . HTN (hypertension)    PT. DENIES  . Hyperlipidemia   . Insomnia   . Irritable bowel syndrome   . Transverse myelitis ([place])   . Vitamin D deficiency     Current Outpatient Medications:  .  ALPRAZolam (XANAX) 0.5 MG tablet,  TAKE ONE TABLET BY MOUTH THREE TIMES DAILY, Disp: 90 tablet, Rfl: 2 .  aspirin EC 81 MG tablet, Take 81 mg by mouth daily., Disp: , Rfl:  .  Calcium Citrate-Vitamin D (CALCIUM CITRATE + D3) 315-250 MG-UNIT TABS, Take 2 tablets by mouth daily., Disp: 120 tablet, Rfl:  .  cyclobenzaprine (FLEXERIL) 5 MG tablet, TAKE ONE TABLET BY MOUTH TWICE DAILY FOR 14 DAYS, Disp: 30 tablet, Rfl: 1 .  FERREX 150 150 MG capsule, TAKE ONE CAPSULE BY MOUTH ONCE DAILY, Disp: 100 capsule, Rfl: 2 .  isosorbide mononitrate (IMDUR) 60 MG 24 hr tablet, Take 1 tablet (60 mg total) by mouth daily., Disp: 90 tablet, Rfl: 1 .  loperamide (IMODIUM) 2 MG capsule, TAKE ONE CAPSULE BY MOUTH FOUR TIMES DAILY AS NEEDED FOR DIARRHEA / LOOSE STOOLS (Patient not taking: Reported on 06/18/2018), Disp: 120 capsule, Rfl: 3 .  nabumetone (RELAFEN) 500 MG tablet, Take 1 tablet (500 mg total) by mouth 2 (two) times daily. For muscle and joint pain, Disp: 180 tablet, Rfl: 1 .  nitroGLYCERIN (NITROSTAT) 0.4 MG SL tablet, DISSOLVE ONE TABLET UNDER TONGUE EVERY 5 MINUTES UP TO 3 DOSES AS NEEDED FOR CHEST PAIN, Disp: 25 tablet, Rfl: 5 .  traMADol (ULTRAM) 50 MG tablet, TAKE ONE TABLET BY MOUTH EVERY TWELVE HOURS AS NEEDED., Disp: 60 tablet, Rfl: 5 .  triamterene-hydrochlorothiazide (DYAZIDE)  37.5-25 MG capsule, TAKE ONE CAPSULE BY MOUTH 3 TIMES PER WEEK, Disp: 30 capsule, Rfl: 5 .  verapamil (CALAN-SR) 120 MG CR tablet, TAKE ONE TABLET BY MOUTH AT BEDTIME, Disp: 90 tablet, Rfl: 3  Assessment/ Plan: 78 y.o. female   1. Acute cystitis without hematuria Urinalysis consistent with acute urinary tract infection.  I have sent in Keflex 500 mg p.o. twice daily for her to take for 7 days.  Discussed that if symptoms worsen or do not resolve she is to seek reevaluation.  She is aware of red flag signs and symptoms warranting emergent evaluation.  She will follow-up PRN - Urinalysis, Complete - Urine Culture  Meds ordered this encounter  Medications  .  cephALEXin (KEFLEX) 500 MG capsule    Sig: Take 1 capsule (500 mg total) by mouth 2 (two) times daily for 7 days.    Dispense:  14 capsule    Refill:  0   Start time: 2:15pm-2:17pm (patient was at office leaving sample), called again 2:43pm End time: 2:45pm  Total time spent on patient care (including telephone call/ virtual visit): 15 minutes  [place], [place] [phone]

## 2018-08-23 LAB — URINE CULTURE

## 2018-08-26 ENCOUNTER — Telehealth: Payer: Self-pay | Admitting: Cardiology

## 2018-08-26 DIAGNOSIS — I208 Other forms of angina pectoris: Secondary | ICD-10-CM

## 2018-08-26 MED ORDER — ISOSORBIDE MONONITRATE ER 120 MG PO TB24: 120.0000 mg | ORAL_TABLET | Freq: Every day | ORAL | Status: DC

## 2018-08-26 NOTE — Telephone Encounter (Signed)
She can increase the Imdur to 120 mg daily and then follow up with an APP in one week.  She should present to the ED with any increase in symptoms.

## 2018-08-26 NOTE — Telephone Encounter (Signed)
Patient has been called and made aware of the provider's recommendations to increase the Imdur to 120 mg once daily. An appointment has been made with a Jory Sims, DNP on Wednesday.

## 2018-08-26 NOTE — Telephone Encounter (Signed)
Spoke with the patient. She stated that at 11 am this morning she was sitting at the kitchen table when she experienced chest discomfort. She stated that this usually happens 1-3 times in a two week time span. Lately she has been having a "gnawing" feeling in her stomach as well when she experiences the chest discomfort. She currently takes Imdur 60 mg once daily.  She would like have Dr. Rosezella [place] recommendation on whether she needs an increase in medication and an office visit.

## 2018-08-26 NOTE — Telephone Encounter (Signed)
  Pt c/o of Chest Pain: STAT if CP now or developed within 24 hours  1. Are you having CP right now? no  2. Are you experiencing any other symptoms (ex. SOB, nausea, vomiting, sweating)? SOB, nausea, lightheadedness but only during the episode of chest pain  3. How long have you been experiencing CP? Since June  4. Is your CP continuous or coming and going? Comes and goes, she says she has a stomach noise when she has the chest pain  5. Have you taken Nitroglycerin? yes ?

## 2018-08-27 ENCOUNTER — Telehealth: Payer: Self-pay | Admitting: Adult Health

## 2018-08-27 NOTE — Telephone Encounter (Signed)

## 2018-08-27 NOTE — Progress Notes (Signed)
Cardiology Office Note   Date:  08/28/2018   ID:  Amy Black, DOB 01-06-1941, MRN [id number]  PCP:  [name], MD  Cardiologist: Dr. Percival Spanish CC: Recurrent abdominal pain and chest pain     History of Present Illness: Amy Black is a 78 y.o. female who presents for ongoing assessment and management of coronary artery disease with a 20% mid LAD with normal LVEF per cardiac catheterization in July 2016.  Other history includes frequent palpitations, hyperlipidemia and hypertension, with recurrent chest pain on nitrates.    The patient was last seen in the office on 12/19/2017 and was taken off of verapamil and started on diltiazem with improvement in her symptoms initially but was subsequently changed back to verapamil as this was controlling her more consistently.  Other noncardiac history includes insomnia, irritable bowel syndrome, and anxiety.  The patient called our office on 08/26/2018 with worsening chest discomfort.  She was increased on isosorbide to 120 mg daily was asked to follow-up in the office for further evaluation of her symptoms.  She states that her symptoms have gotten some better with increased dose of isosorbide.  When discussing her symptoms with her further she states that she has been having some abdominal pain and epigastric pain with associated nausea.  She has been seen by gastroenterologist, Dr. Michail Sermon, but has not seen him in several months.  The patient still has her gallbladder.  Her energy level has unchanged.  Past Medical History:  Diagnosis Date  . Adenocarcinoma (HCC)    LEFT LEG  . Adenocarcinoma of unknown primary ([place]) 02/06/2011  . 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 Allergy   . Anxiety   . CAD (coronary artery disease)    a. minimal CAD by cath in 2016 with presumed spasm (20% mLAD, 20% D2).  . Cataract    bilateral cataracts removed  . Coronary artery spasm ([place])   . Diverticulosis of colon  (without mention of hemorrhage)   . GERD (gastroesophageal reflux disease)   . hepatic cyst   . Hip fracture, right ([place])   . History of cardiac catheterization   . History of nuclear stress test 04/2011   lexiscan; normal study, no significant ischemia, low risk; 2015 - normal  . HTN (hypertension)    PT. DENIES  . Hyperlipidemia   . Insomnia   . Irritable bowel syndrome   . Transverse myelitis ([place])   . Vitamin D deficiency     Past Surgical History:  Procedure Laterality Date  . ABDOMINAL HYSTERECTOMY Bilateral   . CARDIAC CATHETERIZATION     coronary spasm  . CARDIAC CATHETERIZATION N/A 08/28/2014   Procedure: Left Heart Cath and Coronary Angiography;  Surgeon: Jettie Booze, MD;  Location: [place] [facility];  Service: Cardiovascular;  Laterality: N/A;  . COLONOSCOPY    . DIAGNOSTIC LAPAROSCOPY    . LYSIS OF ADHESION    . PELVIC LAPAROSCOPY  1992   RSO, AND LSO ON 2007  . RESECTION SOFT TISSUE TUMOR LEG / ANKLE RADICAL  2004   leg lesion resection & radiation  . SALPINGOOPHORECTOMY Left   . TOTAL HIP ARTHROPLASTY Right 10/04/2015   Procedure: TOTAL HIP ARTHROPLASTY ANTERIOR APPROACH;  Surgeon: Rod Can, MD;  Location: [place];  Service: Orthopedics;  Laterality: Right;  . TRANSTHORACIC ECHOCARDIOGRAM  07/2012   LV cavity size mildly reduced, normal wall motion; MV with calcified annulus and mild MR; LA mildly dilated; atrial septum  with increased thickness - lipomatous hypertrophy; RV systolic pressure increased (borderline pulm HTN)  . UPPER GASTROINTESTINAL ENDOSCOPY       Current Outpatient Medications  Medication Sig Dispense Refill  . ALPRAZolam (XANAX) 0.5 MG tablet TAKE ONE TABLET BY MOUTH THREE TIMES DAILY 90 tablet 2  . aspirin EC 81 MG tablet Take 81 mg by mouth daily.    . Calcium Citrate-Vitamin D (CALCIUM CITRATE + D3) 315-250 MG-UNIT TABS Take 2 tablets by mouth daily. 120 tablet   . cephALEXin (KEFLEX) 500 MG capsule Take 1 capsule (500 mg total)  by mouth 2 (two) times daily for 7 days. 14 capsule 0  . cyclobenzaprine (FLEXERIL) 5 MG tablet TAKE ONE TABLET BY MOUTH TWICE DAILY FOR 14 DAYS 30 tablet 1  . FERREX 150 150 MG capsule TAKE ONE CAPSULE BY MOUTH ONCE DAILY 100 capsule 2  . isosorbide mononitrate (IMDUR) 120 MG 24 hr tablet Take 1 tablet (120 mg total) by mouth daily.    Marland Kitchen loperamide (IMODIUM) 2 MG capsule TAKE ONE CAPSULE BY MOUTH FOUR TIMES DAILY AS NEEDED FOR DIARRHEA / LOOSE STOOLS 120 capsule 3  . nabumetone (RELAFEN) 500 MG tablet Take 1 tablet (500 mg total) by mouth 2 (two) times daily. For muscle and joint pain 180 tablet 1  . nitroGLYCERIN (NITROSTAT) 0.4 MG SL tablet DISSOLVE ONE TABLET UNDER TONGUE EVERY 5 MINUTES UP TO 3 DOSES AS NEEDED FOR CHEST PAIN 25 tablet 5  . traMADol (ULTRAM) 50 MG tablet TAKE ONE TABLET BY MOUTH EVERY TWELVE HOURS AS NEEDED. 60 tablet 5  . triamterene-hydrochlorothiazide (DYAZIDE) 37.5-25 MG capsule TAKE ONE CAPSULE BY MOUTH 3 TIMES PER WEEK 30 capsule 5  . verapamil (CALAN-SR) 120 MG CR tablet TAKE ONE TABLET BY MOUTH AT BEDTIME 90 tablet 3  . pantoprazole (PROTONIX) 20 MG tablet Take 1 tablet (20 mg total) by mouth daily. 30 tablet 6   No current facility-administered medications for this visit.     Allergies:   Iodine, Amlodipine, Lexapro [escitalopram oxalate], Cymbalta [duloxetine hcl], Gabapentin, Ivp dye [iodinated diagnostic agents], and Zanaflex [tizanidine hcl]    Social History:  The patient  reports that she quit smoking about 45 years ago. Her smoking use included cigarettes. She started smoking about 49 years ago. She has a 2.00 pack-year smoking history. She has never used smokeless tobacco. She reports that she does not drink alcohol or use drugs.   Family History:  The patient's family history includes Bladder Cancer in her mother; Coronary artery disease in her unknown relative; Depression in her sister; Diabetes in her brother and unknown relative; Heart disease in her  father and mother; Hyperlipidemia in her sister; Hypertension in her brother and mother; Seizures in her brother; Stroke in her father.    ROS: All other systems are reviewed and negative. Unless otherwise mentioned in H&P    PHYSICAL EXAM: VS:  BP 117/71   Pulse 71   Ht 5\' 3"  (1.6 m)   Wt 148 lb 3.2 oz (67.2 kg)   BMI 26.25 kg/m  , BMI Body mass index is 26.25 kg/m. GEN: Well nourished, well developed, in no acute distress HEENT: normal Neck: no JVD, carotid bruits, or masses Cardiac: RRR; no murmurs, rubs, or gallops,no edema  Respiratory:  Clear to auscultation bilaterally, normal work of breathing GI: soft, nontender, nondistended, + BS MS: no deformity or atrophy Skin: warm and dry, no rash Neuro:  Strength and sensation are intact Psych: euthymic mood, full affect  EKG: Not completed during this office visit  Recent Labs: 10/03/2017: TSH 0.527 06/18/2018: ALT 12; BUN 14; Creatinine, Ser 0.92; Hemoglobin 11.6; Platelets 412; Potassium 5.2; Sodium 139    Lipid Panel    Component Value Date/Time   CHOL 192 06/18/2018 1637   CHOL 203 (H) 08/29/2012 1316   TRIG 116 06/18/2018 1637   TRIG 131 08/29/2012 1316   HDL 75 06/18/2018 1637   HDL 68 08/29/2012 1316   CHOLHDL 2.6 06/18/2018 1637   LDLCALC 94 06/18/2018 1637   LDLCALC 109 (H) 08/29/2012 1316      Wt Readings from Last 3 Encounters:  08/28/18 148 lb 3.2 oz (67.2 kg)  06/18/18 150 lb (68 kg)  04/25/18 152 lb (68.9 kg)      Other studies Reviewed:  Cardiac Cath 08/28/2014  The left ventricular systolic function is normal.  Minimal CAD.  Echocardiogram 12/19/2013 Left ventricle: The cavity size was normal. Systolic function was  normal. The estimated ejection fraction was in the range of 55%  to 60%. Wall motion was normal; there were no regional wall  motion abnormalities. Left ventricular diastolic function  parameters were normal.  - Atrial septum: No defect or patent foramen ovale was  identified.   ASSESSMENT AND PLAN:  1.  Recurrent chest pain: This has resolved with increased dose of isosorbide to 120 mg daily.  She continues on verapamil 120 mg daily as well.  She may be having more GI disturbance then cardiac disturbance as her most recent cardiac catheterization in 2016 only demonstrated minimal CAD.  I have started her on a PPI, omeprazole 20 mg daily.  I have explained to her that it may be a couple of days before she can feel the difference.  She is advised to follow-up with her primary care physician or gastroenterologist for further evaluation and possible check of her gallbladder.  She does not have abdominal tenderness with palpation.  2.  Hypertension: Blood pressures currently well controlled.  No changes in her regimen at this time.  If she begins to feel dizzy or lightheaded on higher dose of isosorbide we may have to cut back on verapamil dose.  But so far she is feeling okay.  Current medicines are reviewed at length with the patient today.    Labs/ tests ordered today include: None  Phill Myron. [place] Pugh, ANP, AACC   08/28/2018 1:02 PM    [facility] [place] [address] Office [phone] Fax [phone]

## 2018-08-28 ENCOUNTER — Ambulatory Visit (INDEPENDENT_AMBULATORY_CARE_PROVIDER_SITE_OTHER): Payer: Medicare Other | Admitting: Adult Health

## 2018-08-28 ENCOUNTER — Other Ambulatory Visit: Payer: Self-pay

## 2018-08-28 ENCOUNTER — Telehealth: Payer: Self-pay | Admitting: Family Medicine

## 2018-08-28 ENCOUNTER — Encounter: Payer: Self-pay | Admitting: Adult Health

## 2018-08-28 VITALS — BP 117/71 | HR 71 | Ht 63.0 in | Wt 148.2 lb

## 2018-08-28 DIAGNOSIS — K219 Gastro-esophageal reflux disease without esophagitis: Secondary | ICD-10-CM

## 2018-08-28 DIAGNOSIS — I10 Essential (primary) hypertension: Secondary | ICD-10-CM

## 2018-08-28 DIAGNOSIS — R0789 Other chest pain: Secondary | ICD-10-CM | POA: Diagnosis not present

## 2018-08-28 MED ORDER — PANTOPRAZOLE SODIUM 20 MG PO TBEC: 20.0000 mg | DELAYED_RELEASE_TABLET | Freq: Every day | ORAL | 6 refills | Status: DC

## 2018-08-28 NOTE — Telephone Encounter (Signed)
Pt aware that she would need to either call here for appt OR call [facility] that she seen last year and get an appt there.

## 2018-08-28 NOTE — Patient Instructions (Addendum)
Medication Instructions:  START- Protonix 20 mg daily  If you need a refill on your cardiac medications before your next appointment, please call your pharmacy.  Labwork: None Ordered   Testing/Procedures: None Ordered  Follow-Up: You will need a follow up appointment in 6 months.  Please call our office 2 months in advance to schedule this appointment.  You may see Minus Breeding, MD or one of the following Advanced Practice Providers on your designated Care Team:   Rosaria Ferries, PA-C . Jory Sims, DNP, ANP     At [facility], you and your health needs are our priority.  As part of our continuing mission to provide you with exceptional heart care, we have created designated Provider Care Teams.  These Care Teams include your primary Cardiologist (physician) and Advanced Practice Providers (APPs -  Physician Assistants and Nurse Practitioners) who all work together to provide you with the care you need, when you need it.  Thank you for choosing [facility] at [facility]!!    Dr. Lear Ng, MD Address: [address], [place], [place] [address] Phone: [phone]

## 2018-08-31 ENCOUNTER — Other Ambulatory Visit (INDEPENDENT_AMBULATORY_CARE_PROVIDER_SITE_OTHER): Payer: Medicare Other | Admitting: Family Medicine

## 2018-08-31 DIAGNOSIS — N3000 Acute cystitis without hematuria: Secondary | ICD-10-CM | POA: Diagnosis not present

## 2018-08-31 MED ORDER — CEPHALEXIN 500 MG PO CAPS: 500.0000 mg | ORAL_CAPSULE | Freq: Four times a day (QID) | ORAL | 0 refills | Status: DC

## 2018-08-31 NOTE — Progress Notes (Signed)
   Virtual Visit via telephone Note  I connected with Amy Black on 08/31/18 at 0735 by telephone and verified that I am speaking with the correct person using two identifiers. Amy Black is currently located at home and No other people are currently with her during visit. The provider, Fransisca Kaufmann Zettie Gootee, MD is located in their office at time of visit.  Call ended at 0744  I discussed the limitations, risks, security and privacy concerns of performing an evaluation and management service by telephone and the availability of in person appointments. I also discussed with the patient that there may be a patient responsible charge related to this service. The patient expressed understanding and agreed to proceed.   History and Present Illness: Patient is coming in today with continued urinary tract symptoms.  She says she took the Keflex twice a day and finished this last Tuesday and does not feel like it fully cleared her urinary tract infection.  She denies any fevers or chills or flank pain but just a lot of burning and irritation with urination and lower abdominal pressure.  She says it got better on the antibiotic but now it is worse again.  She is wondering if something can be called back in.  She did have a urine culture which showed the Keflex should work, is likely just not sufficient dose and will give her another course with a stronger dose.  No diagnosis found.    Review of Systems  Constitutional: Negative for chills and fever.  Eyes: Negative for visual disturbance.  Respiratory: Negative for chest tightness and shortness of breath.   Cardiovascular: Negative for chest pain and leg swelling.  Gastrointestinal: Negative for abdominal pain.  Genitourinary: Positive for dysuria, frequency and urgency. Negative for difficulty urinating, flank pain, hematuria, vaginal bleeding, vaginal discharge and vaginal pain.  Musculoskeletal: Negative for back pain and gait problem.   Skin: Negative for rash.  Neurological: Negative for light-headedness and headaches.  Psychiatric/Behavioral: Negative for agitation and behavioral problems.  All other systems reviewed and are negative.   Observations/Objective: Patient sounds comfortable and in no acute distress  Assessment and Plan: Problem List Items Addressed This Visit    None    Visit Diagnoses    Acute cystitis without hematuria    -  Primary   Relevant Medications   cephALEXin (KEFLEX) 500 MG capsule       Follow Up Instructions:  As needed   I discussed the assessment and treatment plan with the patient. The patient was provided an opportunity to ask questions and all were answered. The patient agreed with the plan and demonstrated an understanding of the instructions.   The patient was advised to call back or seek an in-person evaluation if the symptoms worsen or if the condition fails to improve as anticipated.  The above assessment and management plan was discussed with the patient. The patient verbalized understanding of and has agreed to the management plan. Patient is aware to call the clinic if symptoms persist or worsen. Patient is aware when to return to the clinic for a follow-up visit. Patient educated on when it is appropriate to go to the emergency department.    I provided 9 minutes of non-face-to-face time during this encounter.    Worthy Rancher, MD

## 2018-09-09 ENCOUNTER — Telehealth: Payer: Self-pay | Admitting: Family Medicine

## 2018-09-10 ENCOUNTER — Ambulatory Visit (INDEPENDENT_AMBULATORY_CARE_PROVIDER_SITE_OTHER): Payer: Medicare Other | Admitting: *Deleted

## 2018-09-10 ENCOUNTER — Encounter: Payer: Self-pay | Admitting: *Deleted

## 2018-09-10 DIAGNOSIS — Z0000 Encounter for general adult medical examination without abnormal findings: Secondary | ICD-10-CM | POA: Diagnosis not present

## 2018-09-10 NOTE — Progress Notes (Addendum)
MEDICARE ANNUAL WELLNESS VISIT  09/10/2018  Telephone Visit Disclaimer This Medicare AWV was conducted by telephone due to national recommendations for restrictions regarding the COVID-19 Pandemic (e.g. social distancing).  I verified, using two identifiers, that I am speaking with [place] or their authorized healthcare agent. I discussed the limitations, risks, security, and privacy concerns of performing an evaluation and management service by telephone and the potential availability of an in-person appointment in the future. The patient expressed understanding and agreed to proceed.   Subjective:  Amy Black is a 78 y.o. female patient of Stacks, Cletus Gash, MD who had a Medicare Annual Wellness Visit today via telephone. Mardy is Disabled and lives with their spouse. she has 2 children. she reports that she is socially active and does interact with friends/family regularly. she is not physically active and enjoys reading.  Patient Care Team: [name], MD as PCP - General (Family Medicine) Minus Breeding, MD as PCP - Cardiology (Cardiology) Lonna Cobb, PT (Inactive) as Physical Therapist (Physical Therapy) [place], Holli Humbles, NP as Nurse Practitioner (Nurse Practitioner) Doran Stabler, MD as Consulting Physician (Gastroenterology) Rod Can, MD as Consulting Physician (Orthopedic Surgery) Volanda Napoleon, MD as Consulting Physician (Oncology)  Advanced Directives 09/10/2018 06/14/2017 06/13/2017 07/28/2016 01/28/2016 12/07/2015 10/03/2015  Does Patient Have a Medical Advance Directive? No - No No No No No  Would patient like information on creating a medical advance directive? No - Patient declined Yes (MAU/Ambulatory/Procedural Areas - Information given) - - - Yes - Educational materials given -  Pre-existing out of facility DNR order (yellow form or pink MOST form) - - - - - - -    Hospital Utilization Over the Past 12 Months: # of  hospitalizations or ER visits: 0 # of surgeries: 0  Review of Systems    Patient reports that her overall health is unchanged compared to last year.  Patient Reported Readings (BP, Pulse, CBG, Weight, etc) none  Review of Systems: No complaints  All other systems negative.  Pain Assessment Pain : No/denies pain     Current Medications & Allergies (verified) Allergies as of 09/10/2018      Reactions   Iodine Itching   Amlodipine Other (See Comments)   headaches   Lexapro [escitalopram Oxalate] Other (See Comments)   Drunk, High feeling   Cymbalta [duloxetine Hcl] Other (See Comments)   sleepiness   Gabapentin Other (See Comments)   sleepiness   Ivp Dye [iodinated Diagnostic Agents] Itching   Zanaflex [tizanidine Hcl] Other (See Comments)   Very drunk feeling next day      Medication List       Accurate as of September 10, 2018  2:31 PM. If you have any questions, ask your nurse or doctor.        STOP taking these medications   cephALEXin 500 MG capsule Commonly known as: KEFLEX Stopped by: WYATT, AMY M, RN     TAKE these medications   ALPRAZolam 0.5 MG tablet Commonly known as: XANAX TAKE ONE TABLET BY MOUTH THREE TIMES DAILY   aspirin EC 81 MG tablet Take 81 mg by mouth daily.   Calcium Citrate-Vitamin D 315-250 MG-UNIT Tabs Commonly known as: Calcium Citrate + D3 Take 2 tablets by mouth daily.   cyclobenzaprine 5 MG tablet Commonly known as: FLEXERIL TAKE ONE TABLET BY MOUTH TWICE DAILY FOR 14 DAYS   Ferrex 150 150 MG capsule Generic drug: iron polysaccharides TAKE ONE CAPSULE BY MOUTH ONCE  DAILY   isosorbide mononitrate 120 MG 24 hr tablet Commonly known as: IMDUR Take 1 tablet (120 mg total) by mouth daily.   loperamide 2 MG capsule Commonly known as: IMODIUM TAKE ONE CAPSULE BY MOUTH FOUR TIMES DAILY AS NEEDED FOR DIARRHEA / LOOSE STOOLS   nabumetone 500 MG tablet Commonly known as: RELAFEN Take 1 tablet (500 mg total) by mouth 2 (two)  times daily. For muscle and joint pain   nitroGLYCERIN 0.4 MG SL tablet Commonly known as: Nitrostat DISSOLVE ONE TABLET UNDER TONGUE EVERY 5 MINUTES UP TO 3 DOSES AS NEEDED FOR CHEST PAIN   pantoprazole 20 MG tablet Commonly known as: Protonix Take 1 tablet (20 mg total) by mouth daily.   traMADol 50 MG tablet Commonly known as: ULTRAM TAKE ONE TABLET BY MOUTH EVERY TWELVE HOURS AS NEEDED.   triamterene-hydrochlorothiazide 37.5-25 MG capsule Commonly known as: DYAZIDE TAKE ONE CAPSULE BY MOUTH 3 TIMES PER WEEK   verapamil 120 MG CR tablet Commonly known as: CALAN-SR TAKE ONE TABLET BY MOUTH AT BEDTIME       History (reviewed): Past Medical History:  Diagnosis Date  . Adenocarcinoma (HCC)    LEFT LEG  . Adenocarcinoma of unknown primary ([place]) 02/06/2011  . 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 Allergy   . Anxiety   . CAD (coronary artery disease)    a. minimal CAD by cath in 2016 with presumed spasm (20% mLAD, 20% D2).  . Cataract    bilateral cataracts removed  . Coronary artery spasm ([place])   . Diverticulosis of colon (without mention of hemorrhage)   . GERD (gastroesophageal reflux disease)   . hepatic cyst   . Hip fracture, right ([place])   . History of cardiac catheterization   . History of nuclear stress test 04/2011   lexiscan; normal study, no significant ischemia, low risk; 2015 - normal  . HTN (hypertension)    PT. DENIES  . Hyperlipidemia   . Insomnia   . Irritable bowel syndrome   . Transverse myelitis ([place])   . Vitamin D deficiency    Past Surgical History:  Procedure Laterality Date  . ABDOMINAL HYSTERECTOMY Bilateral   . CARDIAC CATHETERIZATION     coronary spasm  . CARDIAC CATHETERIZATION N/A 08/28/2014   Procedure: Left Heart Cath and Coronary Angiography;  Surgeon: Jettie Booze, MD;  Location: [facility];  Service: Cardiovascular;  Laterality: N/A;  . COLONOSCOPY    . DIAGNOSTIC LAPAROSCOPY    .  LYSIS OF ADHESION    . PELVIC LAPAROSCOPY  1992   RSO, AND LSO ON 2007  . RESECTION SOFT TISSUE TUMOR LEG / ANKLE RADICAL  2004   leg lesion resection & radiation  . SALPINGOOPHORECTOMY Left   . TOTAL HIP ARTHROPLASTY Right 10/04/2015   Procedure: TOTAL HIP ARTHROPLASTY ANTERIOR APPROACH;  Surgeon: Rod Can, MD;  Location: [facility];  Service: Orthopedics;  Laterality: Right;  . TRANSTHORACIC ECHOCARDIOGRAM  07/2012   LV cavity size mildly reduced, normal wall motion; MV with calcified annulus and mild MR; LA mildly dilated; atrial septum with increased thickness - lipomatous hypertrophy; RV systolic pressure increased (borderline pulm HTN)  . UPPER GASTROINTESTINAL ENDOSCOPY     Family History  Problem Relation Age of Onset  . Bladder Cancer Mother   . Heart disease Mother   . Hypertension Mother   . Heart disease Father   . Stroke Father   . Diabetes Other  Multiple family members on both sides   . Coronary artery disease Other        Multiple family members on both sides   . Hyperlipidemia Sister   . Depression Sister   . Diabetes Brother   . Seizures Brother   . Hypertension Brother   . Colon cancer Neg Hx   . Esophageal cancer Neg Hx   . Pancreatic cancer Neg Hx   . Rectal cancer Neg Hx   . Stomach cancer Neg Hx    Social History   Socioeconomic History  . Marital status: Married    Spouse name: Augustin Coupe  . Number of children: 2  . Years of education: 63  . Highest education level: High school graduate  Occupational History  . Occupation: Disabilty  . Occupation: DISABILITY    Employer: UNEMPLOYED  Social Needs  . Financial resource strain: Somewhat hard  . Food insecurity    Worry: Never true    Inability: Never true  . Transportation needs    Medical: No    Non-medical: No  Tobacco Use  . Smoking status: Former Smoker    Packs/day: 0.50    Years: 4.00    Pack years: 2.00    Types: Cigarettes    Start date: 12/07/1968    Quit date: 11/20/1972     Years since quitting: 45.8  . Smokeless tobacco: Never Used  . Tobacco comment: quit 40 years ago  Substance and Sexual Activity  . Alcohol use: No    Alcohol/week: 0.0 standard drinks  . Drug use: No  . Sexual activity: Not Currently    Birth control/protection: Surgical  Lifestyle  . Physical activity    Days per week: 0 days    Minutes per session: 0 min  . Stress: Not at all  Relationships  . Social connections    Talks on phone: More than three times a week    Gets together: More than three times a week    Attends religious service: More than 4 times per year    Active member of club or organization: Yes    Attends meetings of clubs or organizations: More than 4 times per year    Relationship status: Married  Other Topics Concern  . Not on file  Social History Narrative  . Not on file    Activities of Daily Living In your present state of health, do you have any difficulty performing the following activities: 09/10/2018  Hearing? Y  Comment pt is waiting on her hearing aids  Vision? N  Difficulty concentrating or making decisions? N  Walking or climbing stairs? N  Dressing or bathing? N  Doing errands, shopping? N  Preparing Food and eating ? N  Using the Toilet? N  In the past six months, have you accidently leaked urine? N  Do you have problems with loss of bowel control? N  Managing your Medications? N  Managing your Finances? N  Housekeeping or managing your Housekeeping? N  Some recent data might be hidden    Patient Education/ Literacy How often do you need to have someone help you when you read instructions, pamphlets, or other written materials from your doctor or pharmacy?: 1 - Never What is the last grade level you completed in school?: 12th grade  Exercise Current Exercise Habits: The patient does not participate in regular exercise at present, Exercise limited by: cardiac condition(s);orthopedic condition(s)  Diet Patient reports consuming 2 meals  a day and 2 snack(s) a day Patient  reports that her primary diet is: Regular Patient reports that she does have regular access to food.   Depression Screen PHQ 2/9 Scores 09/10/2018 06/18/2018 04/15/2018 12/18/2017 11/06/2017 10/03/2017 06/14/2017  PHQ - 2 Score 0 0 0 0 0 0 0  PHQ- 9 Score - 0 - - - - -     Fall Risk Fall Risk  09/10/2018 06/18/2018 12/18/2017 11/06/2017 10/03/2017  Falls in the past year? 0 0 0 No No  Number falls in past yr: - - - - -  Injury with Fall? - - - - -  Risk Factor Category  - - - - -  Risk for fall due to : - - - - -  Follow up - - - - -     Objective:  [place] seemed alert and oriented and she participated appropriately during our telephone visit.  Blood Pressure Weight BMI  BP Readings from Last 3 Encounters:  08/28/18 117/71  06/18/18 130/73  04/25/18 (!) 163/76   Wt Readings from Last 3 Encounters:  08/28/18 148 lb 3.2 oz (67.2 kg)  06/18/18 150 lb (68 kg)  04/25/18 152 lb (68.9 kg)   BMI Readings from Last 1 Encounters:  08/28/18 26.25 kg/m    *Unable to obtain current vital signs, weight, and BMI due to telephone visit type  Hearing/Vision  . Rosene did not seem to have difficulty with hearing/understanding during the telephone conversation . Reports that she has not had a formal eye exam by an eye care professional within the past year . Reports that she has had a formal hearing evaluation within the past year *Unable to fully assess hearing and vision during telephone visit type  Cognitive Function: 6CIT Screen 09/10/2018  What Year? 0 points  What month? 0 points  What time? 0 points  Count back from 20 0 points  Months in reverse 2 points  Repeat phrase 0 points  Total Score 2   (Normal:0-7, Significant for Dysfunction: >8)  Normal Cognitive Function Screening: Yes   Immunization & Health Maintenance Record Immunization History  Administered Date(s) Administered  . Pneumococcal Polysaccharide-23 02/07/2000  . Tdap  02/06/2002  . Zoster 02/06/2010  . Zoster Recombinat (Shingrix) 11/06/2016, 01/08/2017    Health Maintenance  Topic Date Due  . URINE MICROALBUMIN  10/12/1950  . PNA vac Low Risk Adult (1 of 2 - PCV13) 10/13/2007  . PAP SMEAR-Modifier  11/28/2011  . TETANUS/TDAP  02/07/2012  . DEXA SCAN  12/06/2017  . INFLUENZA VACCINE  09/07/2018  . MAMMOGRAM  02/12/2019  . COLONOSCOPY  08/11/2020       Assessment  This is a routine wellness examination for [facility].  Health Maintenance: Due or Overdue Health Maintenance Due  Topic Date Due  . URINE MICROALBUMIN  10/12/1950  . PNA vac Low Risk Adult (1 of 2 - PCV13) 10/13/2007  . PAP SMEAR-Modifier  11/28/2011  . TETANUS/TDAP  02/07/2012  . DEXA SCAN  12/06/2017  . INFLUENZA VACCINE  09/07/2018    Lorenz Coaster Stella does not need a referral for Community Assistance: Care Management:   no Social Work:    no Prescription Assistance:  no Nutrition/Diabetes Education:  no   Plan:  Personalized Goals Goals Addressed            This Visit's Progress   . DIET - INCREASE WATER INTAKE       Try to drink 6-8 glasses of water daily.      Personalized Health Maintenance &  Screening Recommendations  Pneumococcal vaccine  Td vaccine Bone densitometry screening  Lung Cancer Screening Recommended: no (Low Dose CT Chest recommended if Age 58-80 years, 30 pack-year currently smoking OR have quit w/in past 15 years) Hepatitis C Screening recommended: no HIV Screening recommended: no  Advanced Directives: Written information was not prepared per patient's request.  Referrals & Orders No orders of the defined types were placed in this encounter.   Follow-up Plan . Follow-up with [name], MD as planned . Schedule your DEXA scan as discussed . Consider TDAP and Prevnar13 vaccines at your next visit with your PCP   I have personally reviewed and noted the following in the patient's chart:   . Medical and social history  . Use of alcohol, tobacco or illicit drugs  . Current medications and supplements . Functional ability and status . Nutritional status . Physical activity . Advanced directives . List of other physicians . Hospitalizations, surgeries, and ER visits in previous 12 months . Vitals . Screenings to include cognitive, depression, and falls . Referrals and appointments  In addition, I have reviewed and discussed with Lorenz Coaster Rimel certain preventive protocols, quality metrics, and best practice recommendations. A written personalized care plan for preventive services as well as general preventive health recommendations is available and can be mailed to the patient at her request.      Marylin Crosby, LPN  [id number]/03/7251   I have reviewed and agree with the above AWV documentation.   Mary-Margaret Hassell Done, FNP

## 2018-09-10 NOTE — Patient Instructions (Signed)
Preventive Care 38 Years and Older, Female Preventive care refers to lifestyle choices and visits with your health care provider that can promote health and wellness. This includes:  A yearly physical exam. This is also called an annual well check.  Regular dental and eye exams.  Immunizations.  Screening for certain conditions.  Healthy lifestyle choices, such as diet and exercise. What can I expect for my preventive care visit? Physical exam Your health care provider will check:  Height and weight. These may be used to calculate body mass index (BMI), which is a measurement that tells if you are at a healthy weight.  Heart rate and blood pressure.  Your skin for abnormal spots. Counseling Your health care provider may ask you questions about:  Alcohol, tobacco, and drug use.  Emotional well-being.  Home and relationship well-being.  Sexual activity.  Eating habits.  History of falls.  Memory and ability to understand (cognition).  Work and work Statistician.  Pregnancy and menstrual history. What immunizations do I need?  Influenza (flu) vaccine  This is recommended every year. Tetanus, diphtheria, and pertussis (Tdap) vaccine  You may need a Td booster every 10 years. Varicella (chickenpox) vaccine  You may need this vaccine if you have not already been vaccinated. Zoster (shingles) vaccine  You may need this after age 33. Pneumococcal conjugate (PCV13) vaccine  One dose is recommended after age 33. Pneumococcal polysaccharide (PPSV23) vaccine  One dose is recommended after age 72. Measles, mumps, and rubella (MMR) vaccine  You may need at least one dose of MMR if you were born in 1957 or later. You may also need a second dose. Meningococcal conjugate (MenACWY) vaccine  You may need this if you have certain conditions. Hepatitis A vaccine  You may need this if you have certain conditions or if you travel or work in places where you may be exposed  to hepatitis A. Hepatitis B vaccine  You may need this if you have certain conditions or if you travel or work in places where you may be exposed to hepatitis B. Haemophilus influenzae type b (Hib) vaccine  You may need this if you have certain conditions. You may receive vaccines as individual doses or as more than one vaccine together in one shot (combination vaccines). Talk with your health care provider about the risks and benefits of combination vaccines. What tests do I need? Blood tests  Lipid and cholesterol levels. These may be checked every 5 years, or more frequently depending on your overall health.  Hepatitis C test.  Hepatitis B test. Screening  Lung cancer screening. You may have this screening every year starting at age 39 if you have a 30-pack-year history of smoking and currently smoke or have quit within the past 15 years.  Colorectal cancer screening. All adults should have this screening starting at age 36 and continuing until age 15. Your health care provider may recommend screening at age 23 if you are at increased risk. You will have tests every 1-10 years, depending on your results and the type of screening test.  Diabetes screening. This is done by checking your blood sugar (glucose) after you have not eaten for a while (fasting). You may have this done every 1-3 years.  Mammogram. This may be done every 1-2 years. Talk with your health care provider about how often you should have regular mammograms.  BRCA-related cancer screening. This may be done if you have a family history of breast, ovarian, tubal, or peritoneal cancers.  Other tests  Sexually transmitted disease (STD) testing.  Bone density scan. This is done to screen for osteoporosis. You may have this done starting at age 55. Follow these instructions at home: Eating and drinking  Eat a diet that includes fresh fruits and vegetables, whole grains, lean protein, and low-fat dairy products. Limit  your intake of foods with high amounts of sugar, saturated fats, and salt.  Take vitamin and mineral supplements as recommended by your health care provider.  Do not drink alcohol if your health care provider tells you not to drink.  If you drink alcohol: ? Limit how much you have to 0-1 drink a day. ? Be aware of how much alcohol is in your drink. In the [place] one drink equals one 12 oz bottle of beer (355 mL), one 5 oz glass of wine (148 mL), or one 1 oz glass of hard liquor (44 mL). Lifestyle  Take daily care of your teeth and gums.  Stay active. Exercise for at least 30 minutes on 5 or more days each week.  Do not use any products that contain nicotine or tobacco, such as cigarettes, e-cigarettes, and chewing tobacco. If you need help quitting, ask your health care provider.  If you are sexually active, practice safe sex. Use a condom or other form of protection in order to prevent STIs (sexually transmitted infections).  Talk with your health care provider about taking a low-dose aspirin or statin. What's next?  Go to your health care provider once a year for a well check visit.  Ask your health care provider how often you should have your eyes and teeth checked.  Stay up to date on all vaccines. This information is not intended to replace advice given to you by your health care provider. Make sure you discuss any questions you have with your health care provider. Document Released: 02/19/2015 Document Revised: 01/17/2018 Document Reviewed: 01/17/2018 Elsevier Patient Education  2020 [employer].

## 2018-09-23 ENCOUNTER — Other Ambulatory Visit: Payer: Self-pay | Admitting: Family Medicine

## 2018-09-25 ENCOUNTER — Encounter: Payer: Self-pay | Admitting: Family Medicine

## 2018-09-25 ENCOUNTER — Other Ambulatory Visit: Payer: Self-pay

## 2018-09-25 ENCOUNTER — Ambulatory Visit (INDEPENDENT_AMBULATORY_CARE_PROVIDER_SITE_OTHER): Payer: Medicare Other | Admitting: Family Medicine

## 2018-09-25 VITALS — BP 118/65 | HR 61 | Temp 98.0°F | Ht 63.0 in | Wt 151.0 lb

## 2018-09-25 DIAGNOSIS — R1011 Right upper quadrant pain: Secondary | ICD-10-CM | POA: Diagnosis not present

## 2018-09-25 DIAGNOSIS — R109 Unspecified abdominal pain: Secondary | ICD-10-CM | POA: Diagnosis not present

## 2018-09-25 LAB — URINALYSIS
Bilirubin, UA: NEGATIVE
Glucose, UA: NEGATIVE
Ketones, UA: NEGATIVE
Nitrite, UA: NEGATIVE
Protein,UA: NEGATIVE
RBC, UA: NEGATIVE
Specific Gravity, UA: 1.015 (ref 1.005–1.030)
Urobilinogen, Ur: 0.2 mg/dL (ref 0.2–1.0)
pH, UA: 7 (ref 5.0–7.5)

## 2018-09-25 NOTE — Progress Notes (Signed)
Subjective:  Patient ID: Amy Black, female    DOB: 19-Apr-1940  Age: 78 y.o. MRN: [id number]  CC: Wants to have gallbladder checked and left side pain (Wants urine checked)   HPI Amy Black presents for chronic upper and lower abdominal pain.  Recently it has been chronic abdominal pain.  This has been frequently at the right upper quadrant to the substernal region recently.  In fact her cardiologist is concerned that as it does not appear to be cardiac that she probably has some gallbladder problems.  The patient has no clear history of postprandial pain related to fat intake.  No radiation to the right shoulder blade or shoulder.  Pain tends to go to the lower abdomen as well.  She would like to have a urine specimen today because of concerns about her abdominal pain.  She denies dysuria at this time although it does occur periodically as a burning sensation.  Depression screen [facility] 2/9 09/25/2018 09/10/2018 06/18/2018  Decreased Interest 0 0 0  Down, Depressed, Hopeless 0 0 0  PHQ - 2 Score 0 0 0  Altered sleeping - - 0  Tired, decreased energy - - 0  Change in appetite - - 0  Feeling bad or failure about yourself  - - 0  Trouble concentrating - - 0  Moving slowly or fidgety/restless - - 0  Suicidal thoughts - - 0  PHQ-9 Score - - 0  Some recent data might be hidden    History Amy Black has a past medical history of Adenocarcinoma ([place]), Adenocarcinoma of unknown primary ([place]) (02/06/2011), Adenosquamous carcinoma, Allergy, Anxiety, CAD (coronary artery disease), Cataract, Coronary artery spasm ([place]), Diverticulosis of colon (without mention of hemorrhage), GERD (gastroesophageal reflux disease), hepatic cyst, Hip fracture, right ([place]), History of cardiac catheterization, History of nuclear stress test (04/2011), HTN (hypertension), Hyperlipidemia, Insomnia, Irritable bowel syndrome, Transverse myelitis ([place]), and Vitamin D deficiency.   She has a past surgical history that  includes Resection soft tissue tumor leg / ankle radical (2004); Diagnostic laparoscopy; Lysis of adhesion; Salpingoophorectomy (Left); Pelvic laparoscopy (1992); Cardiac catheterization; transthoracic echocardiogram (07/2012); Cardiac catheterization (N/A, 08/28/2014); Colonoscopy; Upper gastrointestinal endoscopy; Total hip arthroplasty (Right, 10/04/2015); and Abdominal hysterectomy (Bilateral).   Her family history includes Bladder Cancer in her mother; Coronary artery disease in an other family member; Depression in her sister; Diabetes in her brother and another family member; Heart disease in her father and mother; Hyperlipidemia in her sister; Hypertension in her brother and mother; Seizures in her brother; Stroke in her father.She reports that she quit smoking about 45 years ago. Her smoking use included cigarettes. She started smoking about 49 years ago. She has a 2.00 pack-year smoking history. She has never used smokeless tobacco. She reports that she does not drink alcohol or use drugs.    ROS Review of Systems  Constitutional: Negative.   HENT: Negative for congestion.   Eyes: Negative for visual disturbance.  Respiratory: Negative for shortness of breath.   Cardiovascular: Negative for chest pain.  Gastrointestinal: Positive for abdominal pain. Negative for constipation, diarrhea, nausea and vomiting.  Genitourinary: Negative for difficulty urinating.  Musculoskeletal: Negative for arthralgias and myalgias.  Neurological: Negative for headaches.  Psychiatric/Behavioral: Negative for sleep disturbance.    Objective:  BP 118/65   Pulse 61   Temp 98 F (36.7 C) (Oral)   Ht 5\' 3"  (1.6 m)   Wt 151 lb (68.5 kg)   BMI 26.75 kg/m   BP Readings from Last 3  Encounters:  09/25/18 118/65  08/28/18 117/71  06/18/18 130/73    Wt Readings from Last 3 Encounters:  09/25/18 151 lb (68.5 kg)  08/28/18 148 lb 3.2 oz (67.2 kg)  06/18/18 150 lb (68 kg)     Physical Exam  Constitutional:      Appearance: She is well-developed.  HENT:     Head: Normocephalic and atraumatic.  Cardiovascular:     Rate and Rhythm: Normal rate and regular rhythm.     Heart sounds: No murmur.  Pulmonary:     Effort: Pulmonary effort is normal.     Breath sounds: Normal breath sounds.  Abdominal:     General: Bowel sounds are normal.     Palpations: Abdomen is soft. There is no mass.     Tenderness: There is abdominal tenderness (mild RUQ. Murphy negative). There is no right CVA tenderness, left CVA tenderness, guarding or rebound.  Skin:    General: Skin is warm and dry.  Neurological:     Mental Status: She is alert and oriented to person, place, and time.  Psychiatric:        Behavior: Behavior normal.       Assessment & Plan:   Amy Black was seen today for wants to have gallbladder checked and left side pain.  Diagnoses and all orders for this visit:  Right upper quadrant abdominal pain -     Urinalysis -     US Abdomen Complete; Future       I am having Amy Black maintain her Calcium Citrate-Vitamin D, nitroGLYCERIN, loperamide, aspirin EC, cyclobenzaprine, verapamil, nabumetone, traMADol, triamterene-hydrochlorothiazide, ALPRAZolam, isosorbide mononitrate, pantoprazole, and Ferrex 150.  Allergies as of 09/25/2018      Reactions   Iodine Itching   Amlodipine Other (See Comments)   headaches   Lexapro [escitalopram Oxalate] Other (See Comments)   Drunk, High feeling   Cymbalta [duloxetine Hcl] Other (See Comments)   sleepiness   Gabapentin Other (See Comments)   sleepiness   Ivp Dye [iodinated Diagnostic Agents] Itching   Zanaflex [tizanidine Hcl] Other (See Comments)   Very drunk feeling next day      Medication List       Accurate as of September 25, 2018 11:59 PM. If you have any questions, ask your nurse or doctor.        ALPRAZolam 0.5 MG tablet Commonly known as: XANAX TAKE ONE TABLET BY MOUTH THREE TIMES DAILY   aspirin EC 81  MG tablet Take 81 mg by mouth daily.   Calcium Citrate-Vitamin D 315-250 MG-UNIT Tabs Commonly known as: Calcium Citrate + D3 Take 2 tablets by mouth daily.   cyclobenzaprine 5 MG tablet Commonly known as: FLEXERIL TAKE ONE TABLET BY MOUTH TWICE DAILY FOR 14 DAYS   Ferrex 150 150 MG capsule Generic drug: iron polysaccharides TAKE ONE CAPSULE BY MOUTH ONCE DAILY   isosorbide mononitrate 120 MG 24 hr tablet Commonly known as: IMDUR Take 1 tablet (120 mg total) by mouth daily.   loperamide 2 MG capsule Commonly known as: IMODIUM TAKE ONE CAPSULE BY MOUTH FOUR TIMES DAILY AS NEEDED FOR DIARRHEA / LOOSE STOOLS   nabumetone 500 MG tablet Commonly known as: RELAFEN Take 1 tablet (500 mg total) by mouth 2 (two) times daily. For muscle and joint pain   nitroGLYCERIN 0.4 MG SL tablet Commonly known as: Nitrostat DISSOLVE ONE TABLET UNDER TONGUE EVERY 5 MINUTES UP TO 3 DOSES AS NEEDED FOR CHEST PAIN   pantoprazole 20 MG tablet Commonly  known as: Protonix Take 1 tablet (20 mg total) by mouth daily.   traMADol 50 MG tablet Commonly known as: ULTRAM TAKE ONE TABLET BY MOUTH EVERY TWELVE HOURS AS NEEDED.   triamterene-hydrochlorothiazide 37.5-25 MG capsule Commonly known as: DYAZIDE TAKE ONE CAPSULE BY MOUTH 3 TIMES PER WEEK   verapamil 120 MG CR tablet Commonly known as: CALAN-SR TAKE ONE TABLET BY MOUTH AT BEDTIME        Follow-up: Return if symptoms worsen or fail to improve.  [name], M.D.

## 2018-09-26 ENCOUNTER — Encounter: Payer: Self-pay | Admitting: Family Medicine

## 2018-10-09 ENCOUNTER — Other Ambulatory Visit: Payer: Self-pay

## 2018-10-09 ENCOUNTER — Ambulatory Visit (HOSPITAL_COMMUNITY)
Admission: RE | Admit: 2018-10-09 | Discharge: 2018-10-09 | Disposition: A | Discharge status: Home / Self Care | Payer: Medicare Other | Source: Ambulatory Visit | Attending: Family Medicine | Admitting: Family Medicine

## 2018-10-09 DIAGNOSIS — N281 Cyst of kidney, acquired: Secondary | ICD-10-CM | POA: Diagnosis not present

## 2018-10-09 DIAGNOSIS — R1011 Right upper quadrant pain: Secondary | ICD-10-CM

## 2018-10-10 ENCOUNTER — Other Ambulatory Visit: Payer: Self-pay | Admitting: Family Medicine

## 2018-10-18 ENCOUNTER — Telehealth: Payer: Self-pay | Admitting: Family Medicine

## 2018-10-18 NOTE — Telephone Encounter (Signed)
Shingles vaccine complete pt aware

## 2018-10-21 ENCOUNTER — Other Ambulatory Visit: Payer: Self-pay

## 2018-10-21 ENCOUNTER — Emergency Department (HOSPITAL_COMMUNITY): Payer: Medicare Other

## 2018-10-21 ENCOUNTER — Encounter (HOSPITAL_COMMUNITY): Payer: Self-pay

## 2018-10-21 DIAGNOSIS — Z87891 Personal history of nicotine dependence: Secondary | ICD-10-CM | POA: Diagnosis not present

## 2018-10-21 DIAGNOSIS — I10 Essential (primary) hypertension: Secondary | ICD-10-CM | POA: Insufficient documentation

## 2018-10-21 DIAGNOSIS — R51 Headache: Secondary | ICD-10-CM | POA: Insufficient documentation

## 2018-10-21 DIAGNOSIS — I2510 Atherosclerotic heart disease of native coronary artery without angina pectoris: Secondary | ICD-10-CM | POA: Insufficient documentation

## 2018-10-21 DIAGNOSIS — Z7982 Long term (current) use of aspirin: Secondary | ICD-10-CM | POA: Diagnosis not present

## 2018-10-21 DIAGNOSIS — Z79899 Other long term (current) drug therapy: Secondary | ICD-10-CM | POA: Diagnosis not present

## 2018-10-21 NOTE — ED Triage Notes (Signed)
Pt reports 3 episodes of a pounding in the R side of her head. That happened about 1.5 hours ago. She denies current pounding, but reports a headache across her forehead. Equal grips, no difficulty ambulating, no facial droop, or slurred speech noted. A&Ox4.

## 2018-10-22 ENCOUNTER — Emergency Department (HOSPITAL_COMMUNITY)
Admission: EM | Admit: 2018-10-22 | Discharge: 2018-10-22 | Disposition: A | Discharge status: Home / Self Care | Payer: Medicare Other | Attending: Emergency Medicine | Admitting: Emergency Medicine

## 2018-10-22 ENCOUNTER — Telehealth: Payer: Self-pay | Admitting: Family Medicine

## 2018-10-22 DIAGNOSIS — R519 Headache, unspecified: Secondary | ICD-10-CM

## 2018-10-22 DIAGNOSIS — I10 Essential (primary) hypertension: Secondary | ICD-10-CM

## 2018-10-22 MED ORDER — ACETAMINOPHEN 325 MG PO TABS
650.0000 mg | ORAL_TABLET | Freq: Once | ORAL | Status: AC
  Administered 2018-10-22: 01:00:00 650 mg via ORAL
  Filled 2018-10-22: qty 2

## 2018-10-22 MED ORDER — HYDROCHLOROTHIAZIDE 12.5 MG PO CAPS
12.5000 mg | ORAL_CAPSULE | Freq: Once | ORAL | Status: AC
  Administered 2018-10-22: 12.5 mg via ORAL
  Filled 2018-10-22: qty 1

## 2018-10-22 MED ORDER — VERAPAMIL HCL ER 120 MG PO TBCR
120.0000 mg | EXTENDED_RELEASE_TABLET | Freq: Once | ORAL | Status: AC
  Administered 2018-10-22: 120 mg via ORAL
  Filled 2018-10-22: qty 1

## 2018-10-22 NOTE — Telephone Encounter (Signed)
Called to schedule appt Pt wants to wait and see how she is feeling Will call back to schedule

## 2018-10-22 NOTE — ED Provider Notes (Signed)
[facility] DEPT Provider Note   CSN: [id number] Arrival date & time: 10/21/18  1955     History   Chief Complaint Chief Complaint  Patient presents with  . Headache    HPI Amy Black is a 79 y.o. female.      Headache Pain location:  Frontal Quality:  Dull Radiates to:  Does not radiate Timing:  Constant Progression:  Worsening Chronicity:  Recurrent Similar to prior headaches: yes   Context: not activity, not eating and not intercourse   Relieved by:  None tried Worsened by:  Nothing Ineffective treatments:  None tried   Past Medical History:  Diagnosis Date  . Adenocarcinoma (HCC)    LEFT LEG  . Adenocarcinoma of unknown primary ([place]) 02/06/2011  . 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 Allergy   . Anxiety   . CAD (coronary artery disease)    a. minimal CAD by cath in 2016 with presumed spasm (20% mLAD, 20% D2).  . Cataract    bilateral cataracts removed  . Coronary artery spasm ([place])   . Diverticulosis of colon (without mention of hemorrhage)   . GERD (gastroesophageal reflux disease)   . hepatic cyst   . Hip fracture, right ([place])   . History of cardiac catheterization   . History of nuclear stress test 04/2011   lexiscan; normal study, no significant ischemia, low risk; 2015 - normal  . HTN (hypertension)    PT. DENIES  . Hyperlipidemia   . Insomnia   . Irritable bowel syndrome   . Transverse myelitis ([place])   . Vitamin D deficiency     Patient Active Problem List   Diagnosis Date Noted  . Femoral neck fracture, right, closed, initial encounter 10/04/2015  . Thrombocytosis ([place]) 07/23/2015  . Exertional angina (HCC)   . Baker's cyst of knee 02/14/2013  . Coronary artery spasm ([place]) 11/19/2012  . Transverse myelitis ([place]) 10/10/2012  . HTN (hypertension) 08/29/2012  . Arthritis of neck 08/29/2012  . Urinary retention 02/20/2012  . Lower extremity weakness 02/20/2012  .  Adenocarcinoma of unknown primary ([place]) 02/06/2011  . Anxiety 05/19/2010  . Coronary atherosclerosis 12/03/2009  . GERD 12/03/2009    Past Surgical History:  Procedure Laterality Date  . ABDOMINAL HYSTERECTOMY Bilateral   . CARDIAC CATHETERIZATION     coronary spasm  . CARDIAC CATHETERIZATION N/A 08/28/2014   Procedure: Left Heart Cath and Coronary Angiography;  Surgeon: Jettie Booze, MD;  Location: [facility];  Service: Cardiovascular;  Laterality: N/A;  . COLONOSCOPY    . DIAGNOSTIC LAPAROSCOPY    . LYSIS OF ADHESION    . PELVIC LAPAROSCOPY  1992   RSO, AND LSO ON 2007  . RESECTION SOFT TISSUE TUMOR LEG / ANKLE RADICAL  2004   leg lesion resection & radiation  . SALPINGOOPHORECTOMY Left   . TOTAL HIP ARTHROPLASTY Right 10/04/2015   Procedure: TOTAL HIP ARTHROPLASTY ANTERIOR APPROACH;  Surgeon: Rod Can, MD;  Location: [facility];  Service: Orthopedics;  Laterality: Right;  . TRANSTHORACIC ECHOCARDIOGRAM  07/2012   LV cavity size mildly reduced, normal wall motion; MV with calcified annulus and mild MR; LA mildly dilated; atrial septum with increased thickness - lipomatous hypertrophy; RV systolic pressure increased (borderline pulm HTN)  . UPPER GASTROINTESTINAL ENDOSCOPY       OB History    Gravida  3   Para  3   Term      Preterm  AB      Living  3     SAB      TAB      Ectopic      Multiple      Live Births  3            Home Medications    Prior to Admission medications   Medication Sig Start Date End Date Taking? Authorizing Provider  ALPRAZolam Duanne Moron) 0.5 MG tablet TAKE ONE TABLET BY MOUTH THREE TIMES DAILY 08/19/18   [name], MD  aspirin EC 81 MG tablet Take 81 mg by mouth daily.    [provider]  Calcium Citrate-Vitamin D (CALCIUM CITRATE + D3) 315-250 MG-UNIT TABS Take 2 tablets by mouth daily. 12/07/15   Cherre Robins, PharmD  cyclobenzaprine (FLEXERIL) 5 MG tablet TAKE ONE TABLET BY MOUTH TWICE DAILY FOR  14 DAYS 04/19/18   [name], MD  FERREX 150 150 MG capsule TAKE ONE CAPSULE BY MOUTH ONCE DAILY 09/24/18   [name], MD  isosorbide mononitrate (IMDUR) 120 MG 24 hr tablet Take 1 tablet (120 mg total) by mouth daily. 08/26/18   Minus Breeding, MD  loperamide (IMODIUM) 2 MG capsule TAKE ONE CAPSULE BY MOUTH FOUR TIMES DAILY AS NEEDED FOR DIARRHEA / LOOSE STOOLS 09/06/17   Eustaquio Maize, MD  nabumetone (RELAFEN) 500 MG tablet TAKE ONE TABLET BY MOUTH TWICE DAILY FOR MUSCLE & JOINT PAIN 10/11/18   [name], MD  nitroGLYCERIN (NITROSTAT) 0.4 MG SL tablet DISSOLVE ONE TABLET UNDER TONGUE EVERY 5 MINUTES UP TO 3 DOSES AS NEEDED FOR CHEST PAIN 05/14/17   [name], MD  pantoprazole (PROTONIX) 20 MG tablet Take 1 tablet (20 mg total) by mouth daily. 08/28/18   Lendon Colonel, NP  traMADol (ULTRAM) 50 MG tablet TAKE ONE TABLET BY MOUTH EVERY TWELVE HOURS AS NEEDED. 06/18/18   [name], MD  triamterene-hydrochlorothiazide (DYAZIDE) 37.5-25 MG capsule TAKE ONE CAPSULE BY MOUTH 3 TIMES PER WEEK 06/18/18   [name], MD  verapamil (CALAN-SR) 120 MG CR tablet TAKE ONE TABLET BY MOUTH AT BEDTIME 06/06/18   [name], MD    Family History Family History  Problem Relation Age of Onset  . Bladder Cancer Mother   . Heart disease Mother   . Hypertension Mother   . Heart disease Father   . Stroke Father   . Diabetes Other        Multiple family members on both sides   . Coronary artery disease Other        Multiple family members on both sides   . Hyperlipidemia Sister   . Depression Sister   . Diabetes Brother   . Seizures Brother   . Hypertension Brother   . Colon cancer Neg Hx   . Esophageal cancer Neg Hx   . Pancreatic cancer Neg Hx   . Rectal cancer Neg Hx   . Stomach cancer Neg Hx     Social History Social History   Tobacco Use  . Smoking status: Former Smoker    Packs/day: 0.50    Years: 4.00    Pack years: 2.00    Types: Cigarettes    Start date:  12/07/1968    Quit date: 11/20/1972    Years since quitting: 45.9  . Smokeless tobacco: Never Used  . Tobacco comment: quit 40 years ago  Substance Use Topics  . Alcohol use: No    Alcohol/week: 0.0 standard drinks  . Drug use: No  Allergies   Iodine, Amlodipine, Lexapro [escitalopram oxalate], Cymbalta [duloxetine hcl], Gabapentin, Ivp dye [iodinated diagnostic agents], and Zanaflex [tizanidine hcl]   Review of Systems Review of Systems  Neurological: Positive for headaches.  All other systems reviewed and are negative.    Physical Exam Updated Vital Signs BP (!) 193/89   Pulse 64   Temp 98.2 F (36.8 C) (Oral)   Resp 14   Ht 5\' 3"  (1.6 m)   Wt 68 kg   SpO2 100%   BMI 26.57 kg/m   Physical Exam Vitals signs and nursing note reviewed.  Constitutional:      Appearance: She is well-developed.  HENT:     Head: Normocephalic and atraumatic.  Neck:     Musculoskeletal: Normal range of motion.  Cardiovascular:     Rate and Rhythm: Normal rate and regular rhythm.     Heart sounds: No murmur. No friction rub.  Pulmonary:     Effort: No respiratory distress.     Breath sounds: No stridor.  Abdominal:     General: There is no distension.  Musculoskeletal: Normal range of motion.        General: No swelling or tenderness.  Skin:    General: Skin is warm and dry.  Neurological:     Mental Status: She is alert.     GCS: GCS eye subscore is 4. GCS verbal subscore is 5. GCS motor subscore is 6.     Cranial Nerves: No cranial nerve deficit.     Sensory: No sensory deficit.     Motor: No weakness.     Deep Tendon Reflexes: Reflexes normal.  Psychiatric:        Mood and Affect: Mood is not anxious or depressed.      ED Treatments / Results  Labs (all labs ordered are listed, but only abnormal results are displayed) Labs Reviewed - No data to display  EKG None  Radiology Ct Head Wo Contrast  Result Date: 10/21/2018 CLINICAL DATA:  78 year old female with  acute headache, worst headache of life. Right side symptoms for 1-2 hours. EXAM: CT HEAD WITHOUT CONTRAST TECHNIQUE: Contiguous axial images were obtained from the base of the skull through the vertex without intravenous contrast. COMPARISON:  Brain MRI 08/16/2016. Head CT 08/19/2007. FINDINGS: Brain: Patchy and confluent bilateral cerebral white matter hypodensity has substantially progressed since the 2009 CT but appears similar to FLAIR abnormality on the 2018 MRI. Stable cerebral volume. No midline shift, ventriculomegaly, mass effect, evidence of mass lesion, intracranial hemorrhage or evidence of cortically based acute infarction. No cortical encephalomalacia. Vascular: Mild Calcified atherosclerosis at the skull base. No suspicious intracranial vascular hyperdensity. Skull: Stable and negative. Sinuses/Orbits: Visualized paranasal sinuses and mastoids are stable and well pneumatized. Other: No acute orbit or scalp soft tissue findings. IMPRESSION: 1. No acute intracranial abnormality. 2. Advanced cerebral white matter disease stable since the 2018 MRI. Electronically Signed   By: Genevie Ann M.D.   On: 10/21/2018 23:27    Procedures Procedures (including critical care time)  Medications Ordered in ED Medications  verapamil (CALAN-SR) CR tablet 120 mg (120 mg Oral Given 10/22/18 0111)  hydrochlorothiazide (MICROZIDE) capsule 12.5 mg (12.5 mg Oral Given 10/22/18 0111)  acetaminophen (TYLENOL) tablet 650 mg (650 mg Oral Given 10/22/18 0110)     Initial Impression / Assessment and Plan / ED Course  I have reviewed the triage vital signs and the nursing notes.  Pertinent labs & imaging results that were available during my care of  the patient were reviewed by me and considered in my medical decision making (see chart for details).        Headache possibly related to HTN but no e/o htn urgency. Improved with BP meds but won't allow [place] to recheck bp. Will dc w/ pcp follow up.  Final Clinical  Impressions(s) / ED Diagnoses   Final diagnoses:  Nonintractable headache, unspecified chronicity pattern, unspecified headache type  Hypertension, unspecified type    ED Discharge Orders    None       Kaelen Brennan, Corene Cornea, MD 10/22/18 [phone]

## 2018-10-22 NOTE — ED Notes (Signed)
Contacted daughter stacey to let her know whats going on [phone]

## 2018-10-28 ENCOUNTER — Inpatient Hospital Stay (HOSPITAL_BASED_OUTPATIENT_CLINIC_OR_DEPARTMENT_OTHER): Payer: Medicare Other | Admitting: Family

## 2018-10-28 ENCOUNTER — Other Ambulatory Visit: Payer: Self-pay | Admitting: Family

## 2018-10-28 ENCOUNTER — Telehealth: Payer: Self-pay | Admitting: Hematology & Oncology

## 2018-10-28 ENCOUNTER — Inpatient Hospital Stay: Payer: Medicare Other | Attending: Hematology & Oncology

## 2018-10-28 ENCOUNTER — Other Ambulatory Visit: Payer: Self-pay

## 2018-10-28 ENCOUNTER — Encounter: Payer: Self-pay | Admitting: Family

## 2018-10-28 VITALS — BP 156/61 | HR 70 | Temp 97.0°F | Resp 18 | Ht 63.0 in | Wt 150.8 lb

## 2018-10-28 DIAGNOSIS — R5383 Other fatigue: Secondary | ICD-10-CM | POA: Diagnosis not present

## 2018-10-28 DIAGNOSIS — Z859 Personal history of malignant neoplasm, unspecified: Secondary | ICD-10-CM | POA: Diagnosis not present

## 2018-10-28 DIAGNOSIS — K219 Gastro-esophageal reflux disease without esophagitis: Secondary | ICD-10-CM | POA: Diagnosis not present

## 2018-10-28 DIAGNOSIS — D473 Essential (hemorrhagic) thrombocythemia: Secondary | ICD-10-CM | POA: Insufficient documentation

## 2018-10-28 DIAGNOSIS — R110 Nausea: Secondary | ICD-10-CM | POA: Insufficient documentation

## 2018-10-28 DIAGNOSIS — R0609 Other forms of dyspnea: Secondary | ICD-10-CM | POA: Insufficient documentation

## 2018-10-28 DIAGNOSIS — D75839 Thrombocytosis, unspecified: Secondary | ICD-10-CM

## 2018-10-28 DIAGNOSIS — D509 Iron deficiency anemia, unspecified: Secondary | ICD-10-CM

## 2018-10-28 LAB — CBC WITH DIFFERENTIAL (CANCER CENTER ONLY)
Abs Immature Granulocytes: 0.01 10*3/uL (ref 0.00–0.07)
Basophils Absolute: 0.1 10*3/uL (ref 0.0–0.1)
Basophils Relative: 1 %
Eosinophils Absolute: 0.3 10*3/uL (ref 0.0–0.5)
Eosinophils Relative: 6 %
HCT: 36.4 % (ref 36.0–46.0)
Hemoglobin: 11.5 g/dL — ABNORMAL LOW (ref 12.0–15.0)
Immature Granulocytes: 0 %
Lymphocytes Relative: 35 %
Lymphs Abs: 2.1 10*3/uL (ref 0.7–4.0)
MCH: 31 pg (ref 26.0–34.0)
MCHC: 31.6 g/dL (ref 30.0–36.0)
MCV: 98.1 fL (ref 80.0–100.0)
Monocytes Absolute: 0.5 10*3/uL (ref 0.1–1.0)
Monocytes Relative: 8 %
Neutro Abs: 3 10*3/uL (ref 1.7–7.7)
Neutrophils Relative %: 50 %
Platelet Count: 384 10*3/uL (ref 150–400)
RBC: 3.71 MIL/uL — ABNORMAL LOW (ref 3.87–5.11)
RDW: 13.2 % (ref 11.5–15.5)
WBC Count: 5.9 10*3/uL (ref 4.0–10.5)
nRBC: 0 % (ref 0.0–0.2)

## 2018-10-28 LAB — CMP (CANCER CENTER ONLY)
ALT: 10 U/L (ref 0–44)
AST: 13 U/L — ABNORMAL LOW (ref 15–41)
Albumin: 4.3 g/dL (ref 3.5–5.0)
Alkaline Phosphatase: 96 U/L (ref 38–126)
Anion gap: 5 (ref 5–15)
BUN: 17 mg/dL (ref 8–23)
CO2: 31 mmol/L (ref 22–32)
Calcium: 9.8 mg/dL (ref 8.9–10.3)
Chloride: 105 mmol/L (ref 98–111)
Creatinine: 0.86 mg/dL (ref 0.44–1.00)
GFR, Est AFR Am: >60 mL/min (ref >60)
GFR, Estimated: >60 mL/min (ref >60)
Glucose, Bld: 89 mg/dL (ref 70–99)
Potassium: 5.1 mmol/L (ref 3.5–5.1)
Sodium: 141 mmol/L (ref 135–145)
Total Bilirubin: 0.4 mg/dL (ref 0.3–1.2)
Total Protein: 7.4 g/dL (ref 6.5–8.1)

## 2018-10-28 MED ORDER — ONDANSETRON 4 MG PO TBDP: 4.0000 mg | ORAL_TABLET | Freq: Three times a day (TID) | ORAL | 0 refills | Status: DC | PRN

## 2018-10-28 NOTE — Telephone Encounter (Signed)
Called and LM w/ date/time of appointments/ letter/calendar mailed per 9/21 los

## 2018-10-28 NOTE — Progress Notes (Addendum)
Hematology and Oncology Follow Up Visit  Mackenzie Kooiman Zundel [id number] [birth year] 78 y.o. 10/28/2018   Principle Diagnosis:  Transient thrombocytosis History transverse myelitis History of adenosquamous carcinoma of unknown primary  Current Therapy:   Observation   Interim History:  Ms. Danks is here today for follow-up. She is doing well but states she does have some fatigue at times and occasional SOB with over exertion. She will take a break to rest when needed.  She has had some occasional nausea and this has caused her appetite to decrease.  She has history of GERD and takes Protonix daily.  No episodes of bleeding, no bruising or petechiae.  No fever, chills, cough, rash, dizziness, SOB, chest pain, palpitations, abdominal pain or changes in bowel or bladder habits. No swelling, tenderness, numbness or tingling in her extremities.  She is staying well hydrated. Her weight is stable.   ECOG Performance Status: 1 - Symptomatic but completely ambulatory  Medications:  Allergies as of 10/28/2018      Reactions   Iodine Itching   Amlodipine Other (See Comments)   headaches   Lexapro [escitalopram Oxalate] Other (See Comments)   Drunk, High feeling   Cymbalta [duloxetine Hcl] Other (See Comments)   sleepiness   Gabapentin Other (See Comments)   sleepiness   Ivp Dye [iodinated Diagnostic Agents] Itching   Zanaflex [tizanidine Hcl] Other (See Comments)   Very drunk feeling next day      Medication List       Accurate as of October 28, 2018 12:51 PM. If you have any questions, ask your nurse or doctor.        ALPRAZolam 0.5 MG tablet Commonly known as: XANAX TAKE ONE TABLET BY MOUTH THREE TIMES DAILY   aspirin EC 81 MG tablet Take 81 mg by mouth daily.   Calcium Citrate-Vitamin D 315-250 MG-UNIT Tabs Commonly known as: Calcium Citrate + D3 Take 2 tablets by mouth daily.   cyclobenzaprine 5 MG tablet Commonly known as: FLEXERIL TAKE ONE TABLET BY MOUTH TWICE  DAILY FOR 14 DAYS   Ferrex 150 150 MG capsule Generic drug: iron polysaccharides TAKE ONE CAPSULE BY MOUTH ONCE DAILY   isosorbide mononitrate 120 MG 24 hr tablet Commonly known as: IMDUR Take 1 tablet (120 mg total) by mouth daily.   loperamide 2 MG capsule Commonly known as: IMODIUM TAKE ONE CAPSULE BY MOUTH FOUR TIMES DAILY AS NEEDED FOR DIARRHEA / LOOSE STOOLS   nabumetone 500 MG tablet Commonly known as: RELAFEN TAKE ONE TABLET BY MOUTH TWICE DAILY FOR MUSCLE & JOINT PAIN   nitroGLYCERIN 0.4 MG SL tablet Commonly known as: Nitrostat DISSOLVE ONE TABLET UNDER TONGUE EVERY 5 MINUTES UP TO 3 DOSES AS NEEDED FOR CHEST PAIN   pantoprazole 20 MG tablet Commonly known as: Protonix Take 1 tablet (20 mg total) by mouth daily.   traMADol 50 MG tablet Commonly known as: ULTRAM TAKE ONE TABLET BY MOUTH EVERY TWELVE HOURS AS NEEDED.   triamterene-hydrochlorothiazide 37.5-25 MG capsule Commonly known as: DYAZIDE TAKE ONE CAPSULE BY MOUTH 3 TIMES PER WEEK   verapamil 120 MG CR tablet Commonly known as: CALAN-SR TAKE ONE TABLET BY MOUTH AT BEDTIME       Allergies:  Allergies  Allergen Reactions  . Iodine Itching  . Amlodipine Other (See Comments)    headaches  . Lexapro [Escitalopram Oxalate] Other (See Comments)    Drunk, High feeling  . Cymbalta [Duloxetine Hcl] Other (See Comments)    sleepiness  . Gabapentin Other (  See Comments)    sleepiness  . Ivp Dye [Iodinated Diagnostic Agents] Itching  . Zanaflex [Tizanidine Hcl] Other (See Comments)    Very drunk feeling next day    Past Medical History, Surgical history, Social history, and Family History were reviewed and updated.  Review of Systems: All other 10 point review of systems is negative.   Physical Exam:  height is 5\' 3"  (1.6 m) and weight is 150 lb 12.8 oz (68.4 kg). Her temporal temperature is 97 F (36.1 C) (abnormal). Her blood pressure is 156/61 (abnormal) and her pulse is 70. Her respiration is 18  and oxygen saturation is 100%.   Wt Readings from Last 3 Encounters:  10/28/18 150 lb 12.8 oz (68.4 kg)  10/21/18 150 lb (68 kg)  09/25/18 151 lb (68.5 kg)    Ocular: Sclerae unicteric, pupils equal, round and reactive to light Ear-nose-throat: Oropharynx clear, dentition fair Lymphatic: No cervical or supraclavicular adenopathy Lungs no rales or rhonchi, good excursion bilaterally Heart regular rate and rhythm, no murmur appreciated Abd soft, nontender, positive bowel sounds, no liver or spleen tip palpated on exam, no fluid wave  MSK no focal spinal tenderness, no joint edema Neuro: non-focal, well-oriented, appropriate affect Breasts: Deferred   Lab Results  Component Value Date   WBC 5.9 10/28/2018   HGB 11.5 (L) 10/28/2018   HCT 36.4 10/28/2018   MCV 98.1 10/28/2018   PLT 384 10/28/2018   Lab Results  Component Value Date   FERRITIN 74 04/25/2018   IRON 71 04/25/2018   TIBC 239 04/25/2018   UIBC 168 04/25/2018   IRONPCTSAT 30 04/25/2018   Lab Results  Component Value Date   RETICCTPCT 1.6 10/03/2017   RBC 3.71 (L) 10/28/2018   No results found for: KPAFRELGTCHN, LAMBDASER, KAPLAMBRATIO No results found for: IGGSERUM, IGA, IGMSERUM No results found for: Odetta Pink, SPEI   Chemistry      Component Value Date/Time   NA 141 10/28/2018 1102   NA 139 06/18/2018 1637   NA 138 01/28/2016 1303   K 5.1 10/28/2018 1102   K 4.2 07/28/2016 1243   K 4.2 01/28/2016 1303   CL 105 10/28/2018 1102   CL 98 07/28/2016 1243   CL 101 07/20/2014 1131   CO2 31 10/28/2018 1102   CO2 29 07/28/2016 1243   CO2 30 (H) 01/28/2016 1303   BUN 17 10/28/2018 1102   BUN 14 06/18/2018 1637   BUN 10.0 01/28/2016 1303   CREATININE 0.86 10/28/2018 1102   CREATININE 0.94 07/28/2016 1243   CREATININE 0.8 01/28/2016 1303      Component Value Date/Time   CALCIUM 9.8 10/28/2018 1102   CALCIUM 9.9 07/28/2016 1243   CALCIUM 10.0  01/28/2016 1303   ALKPHOS 96 10/28/2018 1102   ALKPHOS 105 07/28/2016 1243   ALKPHOS 123 01/28/2016 1303   AST 13 (L) 10/28/2018 1102   AST 13 01/28/2016 1303   ALT 10 10/28/2018 1102   ALT 12 01/28/2016 1303   BILITOT 0.4 10/28/2018 1102   BILITOT 0.51 01/28/2016 1303       Impression and Plan: Ms. Jarret is a very pleasant 78 yo African American female with history of transient thrombocytosis and also history of adenosquamous carcinoma with unknown primary. So far, there is no evidence of recurrence.  Platelet count was 384, Hgb 11.5 and WBC count 5.9.  We will see what her iron studies show and bring her back in for infusion if needed.  She requested something from episodes of nausea. Prescription for Zofran ODT sent to her pharmacy.  We will go ahead and plan to see her back in another 6 months.  She will contact our office with any questions or concerns. We can certainly see her sooner.   Laverna Peace, NP 9/21/202012:51 PM

## 2018-10-29 LAB — FERRITIN: Ferritin: 109 ng/mL (ref 11–307)

## 2018-10-29 LAB — IRON AND TIBC
Iron: 92 ug/dL (ref 41–142)
Saturation Ratios: 39 % (ref 21–57)
TIBC: 236 ug/dL (ref 236–444)
UIBC: 144 ug/dL (ref 120–384)

## 2018-11-04 ENCOUNTER — Telehealth: Payer: Self-pay | Admitting: Family Medicine

## 2018-11-04 NOTE — Telephone Encounter (Signed)
Left message to call back  

## 2018-11-04 NOTE — Telephone Encounter (Signed)
Appointment scheduled for ER follow up on 11/07/2018 at 7:55 am.

## 2018-11-05 ENCOUNTER — Other Ambulatory Visit: Payer: Self-pay

## 2018-11-05 MED ORDER — LOPERAMIDE HCL 2 MG PO CAPS: ORAL_CAPSULE | ORAL | 0 refills | Status: DC

## 2018-11-05 NOTE — Telephone Encounter (Signed)
Patient was seen 06/18/2018 for check up- rx sent per protocol.

## 2018-11-07 ENCOUNTER — Ambulatory Visit: Payer: Medicare Other | Admitting: Family Medicine

## 2018-11-14 ENCOUNTER — Encounter: Payer: Self-pay | Admitting: Gynecology

## 2018-11-14 DIAGNOSIS — R002 Palpitations: Secondary | ICD-10-CM | POA: Insufficient documentation

## 2018-11-14 NOTE — Progress Notes (Signed)
Cardiology Office Note   Date:  11/15/2018   ID:  Amy Black, DOB Jul 06, 1940, MRN [id number]  PCP:  [name], MD  Cardiologist:   Minus Breeding, MD    Chief Complaint  Patient presents with  . Dizziness     History of Present Illness: Amy Black is a 78 y.o. female who presents for a follow up of presumed coronary spasm. On 08/28/2014, she had left heart cath that showed minimal CAD, 20% disease in LAD was normal LVEF. He also had history of palpitations and hypertension. She has been on verapamil for greater than 25 years.  She has had recurrent chest pain and has been treated with increased doses of Imdur.  She had relief of her pain after being taken off of verapamil and started on Cardizem.  She continued to have some symptoms as described and subsequently she was changed back to verapamil. She had chest pain when I last saw her and she I increased her Imdur.    She says that she has been getting dizziness.  This seems to be happening more than before.  She described the same type of sensation but with less frequency in the past.  She just might be sitting or standing or doing something that she will feel weak.  Says this last for several minutes.  She has to go lie down before goes away.  She does not describe visual changes or motor changes.  She does not feel dizzy.  She does not necessarily notice her heart skipping although she has had palpitations.  She has not had any frank syncope.  She says she has less tolerance for doing things like going grocery shopping.  To get more tired.  She has had blood work to include CBC with mild anemia but normal iron studies.  She is not having any chest pressure, neck or arm discomfort.  There is no new shortness of breath, PND or orthopnea.  She is not really describing orthostatic symptoms.   Past Medical History:  Diagnosis Date  . Adenocarcinoma (HCC)    LEFT LEG  . Adenocarcinoma of unknown primary ([place]) 02/06/2011  .  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 Allergy   . Anxiety   . CAD (coronary artery disease)    a. minimal CAD by cath in 2016 with presumed spasm (20% mLAD, 20% D2).  . Cataract    bilateral cataracts removed  . Coronary artery spasm ([place])   . Diverticulosis of colon (without mention of hemorrhage)   . GERD (gastroesophageal reflux disease)   . hepatic cyst   . Hip fracture, right ([place])   . History of cardiac catheterization   . History of nuclear stress test 04/2011   lexiscan; normal study, no significant ischemia, low risk; 2015 - normal  . HTN (hypertension)    PT. DENIES  . Hyperlipidemia   . Insomnia   . Irritable bowel syndrome   . Transverse myelitis ([place])   . Vitamin D deficiency     Past Surgical History:  Procedure Laterality Date  . ABDOMINAL HYSTERECTOMY Bilateral   . CARDIAC CATHETERIZATION     coronary spasm  . CARDIAC CATHETERIZATION N/A 08/28/2014   Procedure: Left Heart Cath and Coronary Angiography;  Surgeon: Jettie Booze, MD;  Location: [facility];  Service: Cardiovascular;  Laterality: N/A;  . COLONOSCOPY    . DIAGNOSTIC LAPAROSCOPY    . LYSIS OF ADHESION    .  PELVIC LAPAROSCOPY  1992   RSO, AND LSO ON 2007  . RESECTION SOFT TISSUE TUMOR LEG / ANKLE RADICAL  2004   leg lesion resection & radiation  . SALPINGOOPHORECTOMY Left   . TOTAL HIP ARTHROPLASTY Right 10/04/2015   Procedure: TOTAL HIP ARTHROPLASTY ANTERIOR APPROACH;  Surgeon: Rod Can, MD;  Location: [facility];  Service: Orthopedics;  Laterality: Right;  . TRANSTHORACIC ECHOCARDIOGRAM  07/2012   LV cavity size mildly reduced, normal wall motion; MV with calcified annulus and mild MR; LA mildly dilated; atrial septum with increased thickness - lipomatous hypertrophy; RV systolic pressure increased (borderline pulm HTN)  . UPPER GASTROINTESTINAL ENDOSCOPY       Current Outpatient Medications  Medication Sig Dispense Refill  . ALPRAZolam (XANAX) 0.5  MG tablet TAKE ONE TABLET BY MOUTH THREE TIMES DAILY 90 tablet 2  . aspirin EC 81 MG tablet Take 81 mg by mouth daily.    . Calcium Citrate-Vitamin D (CALCIUM CITRATE + D3) 315-250 MG-UNIT TABS Take 2 tablets by mouth daily. 120 tablet   . cyclobenzaprine (FLEXERIL) 5 MG tablet TAKE ONE TABLET BY MOUTH TWICE DAILY FOR 14 DAYS 30 tablet 1  . FERREX 150 150 MG capsule TAKE ONE CAPSULE BY MOUTH ONCE DAILY 100 capsule 0  . isosorbide mononitrate (IMDUR) 120 MG 24 hr tablet Take 1 tablet (120 mg total) by mouth daily.    Marland Kitchen loperamide (IMODIUM) 2 MG capsule 1 capsule by mouth four times daily PRN 120 capsule 0  . nabumetone (RELAFEN) 500 MG tablet TAKE ONE TABLET BY MOUTH TWICE DAILY FOR MUSCLE & JOINT PAIN 180 tablet 1  . nitroGLYCERIN (NITROSTAT) 0.4 MG SL tablet DISSOLVE ONE TABLET UNDER TONGUE EVERY 5 MINUTES UP TO 3 DOSES AS NEEDED FOR CHEST PAIN 25 tablet 5  . ondansetron (ZOFRAN ODT) 4 MG disintegrating tablet Take 1 tablet (4 mg total) by mouth every 8 (eight) hours as needed for nausea or vomiting. 20 tablet 0  . pantoprazole (PROTONIX) 20 MG tablet Take 1 tablet (20 mg total) by mouth daily. 30 tablet 6  . traMADol (ULTRAM) 50 MG tablet TAKE ONE TABLET BY MOUTH EVERY TWELVE HOURS AS NEEDED. 60 tablet 5  . triamterene-hydrochlorothiazide (DYAZIDE) 37.5-25 MG capsule TAKE ONE CAPSULE BY MOUTH 3 TIMES PER WEEK 30 capsule 5  . verapamil (CALAN-SR) 120 MG CR tablet TAKE ONE TABLET BY MOUTH AT BEDTIME 90 tablet 3   No current facility-administered medications for this visit.     Allergies:   Iodine, Amlodipine, Lexapro [escitalopram oxalate], Cymbalta [duloxetine hcl], Gabapentin, Ivp dye [iodinated diagnostic agents], and Zanaflex [tizanidine hcl]    ROS:  Please see the history of present illness.   Otherwise, review of systems are positive for transverse myelitis with weakness of the right leg chronic.   All other systems are reviewed and negative.    PHYSICAL EXAM: VS:  BP (!) 153/88    Pulse 69   Ht 5\' 3"  (1.6 m)   Wt 151 lb (68.5 kg)   SpO2 99%   BMI 26.75 kg/m  , BMI Body mass index is 26.75 kg/m.  GENERAL:  Well appearing NECK:  No jugular venous distention, waveform within normal limits, carotid upstroke brisk and symmetric, no bruits, no thyromegaly LUNGS:  Clear to auscultation bilaterally CHEST:  Unremarkable HEART:  PMI not displaced or sustained,S1 and S2 within normal limits, no S3, no S4, no clicks, no rubs, 2 out of 6 left upper sternal border systolic murmur nonradiating, no diastolic murmurs ABD:  Flat, positive bowel sounds normal in frequency in pitch, no bruits, no rebound, no guarding, no midline pulsatile mass, no hepatomegaly, no splenomegaly EXT:  2 plus pulses throughout, no edema, no cyanosis no clubbing    EKG:  EKG is ordered today. EKG 09/27/16 sinus rhythm, rate 69, axis within normal limits, intervals within normal limits, no acute ST-T wave changes.  Recent Labs: 10/28/2018: ALT 10; BUN 17; Creatinine 0.86; Hemoglobin 11.5; Platelet Count 384; Potassium 5.1; Sodium 141    Lipid Panel    Component Value Date/Time   CHOL 192 06/18/2018 1637   CHOL 203 (H) 08/29/2012 1316   TRIG 116 06/18/2018 1637   TRIG 131 08/29/2012 1316   HDL 75 06/18/2018 1637   HDL 68 08/29/2012 1316   CHOLHDL 2.6 06/18/2018 1637   LDLCALC 94 06/18/2018 1637   LDLCALC 109 (H) 08/29/2012 1316      Wt Readings from Last 3 Encounters:  11/15/18 151 lb (68.5 kg)  10/28/18 150 lb 12.8 oz (68.4 kg)  10/21/18 150 lb (68 kg)      Other studies Reviewed: Additional studies/ records that were reviewed today include:  Labs Review of the above records demonstrates:  NA   ASSESSMENT AND PLAN:  Coronary Spasm:    Patient's not having chest pain.  I am not going to suggest further work-up.  I doubt that she has developed obstructive coronary disease as the etiology of these complaints.  She will continue with primary risk reduction.   HTN:   Her blood pressure  is elevated today but she says typically is in the 120s over 70s.  I am not going to reduce her medications given the fact that it does fluctuate on her home readings to 150s not infrequently systolic.    Palpitations:   Unclear whether this could be part of the etiology of her weakness.  I will check a 2-week event monitor.   Dizziness:  As above.  She had mild orthostatic blood pressure drop in her blood pressure came back after 3 minutes.  I will think this is contributing.  Current medicines are reviewed at length with the patient today.  The patient does not have concerns regarding medicines.  The following changes have been made:  None  Labs/ tests ordered today include:     Orders Placed This Encounter  Procedures  . TSH  . LONG TERM MONITOR (3-14 DAYS)  . EKG 12-Lead     Disposition:   FU with me in six months in [place].  Signed, Minus Breeding, MD  11/15/2018 10:55 AM    [place]

## 2018-11-15 ENCOUNTER — Ambulatory Visit: Payer: Medicare Other | Admitting: Cardiology

## 2018-11-15 ENCOUNTER — Encounter: Payer: Self-pay | Admitting: Cardiology

## 2018-11-15 ENCOUNTER — Other Ambulatory Visit: Payer: Self-pay

## 2018-11-15 VITALS — BP 153/88 | HR 69 | Ht 63.0 in | Wt 151.0 lb

## 2018-11-15 DIAGNOSIS — I25111 Atherosclerotic heart disease of native coronary artery with angina pectoris with documented spasm: Secondary | ICD-10-CM | POA: Diagnosis not present

## 2018-11-15 DIAGNOSIS — R002 Palpitations: Secondary | ICD-10-CM

## 2018-11-15 DIAGNOSIS — I10 Essential (primary) hypertension: Secondary | ICD-10-CM

## 2018-11-15 LAB — TSH: TSH: 0.717 u[IU]/mL (ref 0.450–4.500)

## 2018-11-15 NOTE — Patient Instructions (Addendum)
Medication Instructions:  Your physician recommends that you continue on your current medications as directed. Please refer to the Current Medication list given to you today.  If you need a refill on your cardiac medications before your next appointment, please call your pharmacy.   Lab work: TSH If you have labs (blood work) drawn today and your tests are completely normal, you will receive your results only by: [place] (if you have MyChart) OR A paper copy in the mail If you have any lab test that is abnormal or we need to change your treatment, we will call you to review the results.  Testing/Procedures: Your physician has recommended that you wear a 14 day event monitor. Event monitors are medical devices that record the heart's electrical activity. Doctors most often [place] these monitors to diagnose arrhythmias. Arrhythmias are problems with the speed or rhythm of the heartbeat. The monitor is a small, portable device. You can wear one while you do your normal daily activities. This is usually used to diagnose what is causing palpitations/syncope (passing out). You will receive a call regarding your monitor. It will then be mailed with instructions.     Follow-Up: At [facility], you and your health needs are our priority.  As part of our continuing mission to provide you with exceptional heart care, we have created designated Provider Care Teams.  These Care Teams include your primary Cardiologist (physician) and Advanced Practice Providers (APPs -  Physician Assistants and Nurse Practitioners) who all work together to provide you with the care you need, when you need it. You will need a follow up appointment in 6 months at the [facility] [place] office.  Please call our office 2 months in advance to schedule this appointment.  You may see Minus Breeding, MD or one of the following Advanced Practice Providers on your designated Care Team:   Rosaria Ferries, PA-C Jory Sims, DNP, ANP    Any Other Special Instructions Will Be Listed Below (If Applicable).  Ambulatory Cardiac Monitoring An ambulatory cardiac monitor is a small recording device that is used to detect abnormal heart rhythms (arrhythmias). Most monitors are connected by wires to flat, sticky disks (electrodes) that are then attached to your chest. You may need to wear a monitor if you have had symptoms such as:  Fast heartbeats (palpitations).  Dizziness.  Fainting or light-headedness.  Unexplained weakness.  Shortness of breath. There are several types of monitors. Some common monitors include:  Holter monitor. This records your heart rhythm continuously, usually for 24-48 hours.  Event (episodic) monitor. This monitor has a symptoms button, and when pushed, it will begin recording. You need to activate this monitor to record when you have a heart-related symptom.  Automatic detection monitor. This monitor will begin recording when it detects an abnormal heartbeat. What are the risks? Generally, these devices are safe to use. However, it is possible that the skin under the electrodes will become irritated. How to prepare for monitoring Your health care provider will prepare your chest for the electrode placement and show you how to use the monitor.  Do not apply lotions to your chest before monitoring.  Follow directions on how to care for the monitor, and how to return the monitor when the testing period is complete. How to use your cardiac monitor  Follow directions about how long to wear the monitor, and if you can take the monitor off in order to shower or bathe. ? Do not let the monitor get wet. ?  Do not bathe, swim, or use a hot tub while wearing the monitor.  Keep your skin clean. Do not put body lotion or moisturizer on your chest.  Change the electrodes as told by your health care provider, or any time they stop sticking to your skin. You may need to use medical tape to keep them on.   Try to put the electrodes in slightly different places on your chest to help prevent skin irritation. Follow directions from your health care provider about where to place the electrodes.  Make sure the monitor is safely clipped to your clothing or in a location close to your body as recommended by your health care provider.  If your monitor has a symptoms button, press the button to mark an event as soon as you feel a heart-related symptom, such as: ? Dizziness. ? Weakness. ? Light-headedness. ? Palpitations. ? Thumping or pounding in your chest. ? Shortness of breath. ? Unexplained weakness.  Keep a diary of your activities, such as walking, doing chores, and taking medicine. It is very important to note what you were doing when you pushed the button to record your symptoms. This will help your health care provider determine what might be contributing to your symptoms.  Send the recorded information as recommended by your health care provider. It may take some time for your health care provider to process the results.  Change the batteries as told by your health care provider.  Keep electronic devices away from your monitor. These include: ? Tablets. ? MP3 players. ? Cell phones.  While wearing your monitor you should avoid: ? Electric blankets. ? Armed forces operational officer. ? Electric toothbrushes. ? Microwave ovens. ? Magnets. ? Metal detectors. Get help right away if:  You have chest pain.  You have shortness of breath or extreme difficulty breathing.  You develop a very fast heartbeat that does not get better.  You develop dizziness that does not go away.  You faint or constantly feel like you are about to faint. Summary  An ambulatory cardiac monitor is a small recording device that is used to detect abnormal heart rhythms (arrhythmias).  Make sure you understand how to send the information from the monitor to your health care provider.  It is important to press the button  on the monitor when you have any heart-related symptoms.  Keep a diary of your activities, such as walking, doing chores, and taking medicine. It is very important to note what you were doing when you pushed the button to record your symptoms. This will help your health care provider learn what might be causing your symptoms. This information is not intended to replace advice given to you by your health care provider. Make sure you discuss any questions you have with your health care provider. Document Released: 11/02/2007 Document Revised: 01/05/2017 Document Reviewed: 01/08/2016 Elsevier Patient Education  2020 [employer].

## 2018-11-16 ENCOUNTER — Other Ambulatory Visit: Payer: Self-pay | Admitting: Family Medicine

## 2018-11-19 ENCOUNTER — Ambulatory Visit: Payer: Medicare Other | Admitting: Family Medicine

## 2018-11-20 ENCOUNTER — Encounter: Payer: Self-pay | Admitting: Family Medicine

## 2018-11-21 ENCOUNTER — Telehealth: Payer: Self-pay | Admitting: *Deleted

## 2018-11-21 NOTE — Telephone Encounter (Signed)
[employer] to ship a 14 day cardiac event monitor to the patients home.  Instructions included in the monitor kit. 

## 2018-11-23 ENCOUNTER — Other Ambulatory Visit: Payer: Self-pay | Admitting: Family

## 2018-11-23 DIAGNOSIS — R197 Diarrhea, unspecified: Secondary | ICD-10-CM

## 2018-11-28 ENCOUNTER — Ambulatory Visit (INDEPENDENT_AMBULATORY_CARE_PROVIDER_SITE_OTHER): Payer: Medicare Other

## 2018-11-28 DIAGNOSIS — R002 Palpitations: Secondary | ICD-10-CM | POA: Diagnosis not present

## 2018-12-23 ENCOUNTER — Encounter: Payer: Self-pay | Admitting: Family Medicine

## 2018-12-23 ENCOUNTER — Ambulatory Visit (INDEPENDENT_AMBULATORY_CARE_PROVIDER_SITE_OTHER): Payer: Medicare Other | Admitting: Family Medicine

## 2018-12-23 DIAGNOSIS — F419 Anxiety disorder, unspecified: Secondary | ICD-10-CM

## 2018-12-23 DIAGNOSIS — I25119 Atherosclerotic heart disease of native coronary artery with unspecified angina pectoris: Secondary | ICD-10-CM

## 2018-12-23 DIAGNOSIS — I208 Other forms of angina pectoris: Secondary | ICD-10-CM

## 2018-12-23 DIAGNOSIS — I10 Essential (primary) hypertension: Secondary | ICD-10-CM | POA: Diagnosis not present

## 2018-12-23 MED ORDER — ALPRAZOLAM 0.5 MG PO TABS: 0.5000 mg | ORAL_TABLET | Freq: Three times a day (TID) | ORAL | 5 refills | Status: DC

## 2018-12-23 NOTE — Progress Notes (Signed)
Subjective:    Patient ID: Amy Black, female    DOB: [birth year], 78 y.o.   MRN: [id number]   HPI: Amy Black is a 78 y.o. female presenting for went to E.D. for a terrible headachetwo months ago, but doing fine now. Scan done in E.D. was normal and sge has had no further headache problem. She needs her med for anxiety refilled. She says that if she forgets it her body will let her know, she starts trmbling due to the nervousness and anxiety. Has been taking xanax for many years (possibly 30?)  She is about out of her angina medication, imdur as well. No chest pain recently as long as she takes the medication.    Depression screen [facility] 2/9 09/25/2018 09/10/2018 06/18/2018 04/15/2018 12/18/2017  Decreased Interest 0 0 0 0 0  Down, Depressed, Hopeless 0 0 0 0 0  PHQ - 2 Score 0 0 0 0 0  Altered sleeping - - 0 - -  Tired, decreased energy - - 0 - -  Change in appetite - - 0 - -  Feeling bad or failure about yourself  - - 0 - -  Trouble concentrating - - 0 - -  Moving slowly or fidgety/restless - - 0 - -  Suicidal thoughts - - 0 - -  PHQ-9 Score - - 0 - -  Some recent data might be hidden     Relevant past medical, surgical, family and social history reviewed and updated as indicated.  Interim medical history since our last visit reviewed. Allergies and medications reviewed and updated.  ROS:  Review of Systems  Constitutional: Negative.   HENT: Negative for congestion.   Eyes: Negative for visual disturbance.  Respiratory: Negative for shortness of breath.   Cardiovascular: Negative for chest pain.  Gastrointestinal: Negative for abdominal pain, constipation, diarrhea, nausea and vomiting.  Genitourinary: Negative for difficulty urinating.  Musculoskeletal: Negative for arthralgias and myalgias.  Neurological: Negative for headaches.  Psychiatric/Behavioral: Negative for sleep disturbance.     Social History   Tobacco Use  Smoking Status Former Smoker  .  Packs/day: 0.50  . Years: 4.00  . Pack years: 2.00  . Types: Cigarettes  . Start date: 12/07/1968  . Quit date: 11/20/1972  . Years since quitting: 46.1  Smokeless Tobacco Never Used  Tobacco Comment   quit 40 years ago       Objective:     Wt Readings from Last 3 Encounters:  11/15/18 151 lb (68.5 kg)  10/28/18 150 lb 12.8 oz (68.4 kg)  10/21/18 150 lb (68 kg)     Exam deferred. Pt. Harboring due to COVID 19. Phone visit performed.   Assessment & Plan:   1. Essential hypertension   2. Anxiety   3. Atherosclerosis of native coronary artery of native heart with angina pectoris ([place])   4. Exertional angina (HCC)     Meds ordered this encounter  Medications  . ALPRAZolam (XANAX) 0.5 MG tablet    Sig: Take 1 tablet (0.5 mg total) by mouth 3 (three) times daily.    Dispense:  90 tablet    Refill:  5    This prescription was filled on 11/16/2018. Any refills authorized will be placed on file.    No orders of the defined types were placed in this encounter.     Diagnoses and all orders for this visit:  Essential hypertension  Anxiety  Atherosclerosis of native coronary artery of native heart with angina  pectoris ([place])  Exertional angina ([place])  Other orders -     ALPRAZolam (XANAX) 0.5 MG tablet; Take 1 tablet (0.5 mg total) by mouth 3 (three) times daily.    Virtual Visit via telephone Note  I discussed the limitations, risks, security and privacy concerns of performing an evaluation and management service by telephone and the availability of in person appointments. The patient was identified with two identifiers. Pt.expressed understanding and agreed to proceed. Pt. Is at home. Dr. Livia Snellen is in his office.  Follow Up Instructions:   I discussed the assessment and treatment plan with the patient. The patient was provided an opportunity to ask questions and all were answered. The patient agreed with the plan and demonstrated an understanding of the  instructions.   The patient was advised to call back or seek an in-person evaluation if the symptoms worsen or if the condition fails to improve as anticipated.   Total minutes including chart review and phone contact time: 26   Follow up plan: Return in about 3 months (around 03/25/2019).  [name], MD [place]

## 2019-01-08 ENCOUNTER — Other Ambulatory Visit: Payer: Self-pay

## 2019-01-08 ENCOUNTER — Telehealth: Payer: Self-pay | Admitting: *Deleted

## 2019-01-08 ENCOUNTER — Encounter: Payer: Self-pay | Admitting: Family Medicine

## 2019-01-08 ENCOUNTER — Other Ambulatory Visit: Payer: Self-pay | Admitting: Cardiology

## 2019-01-08 ENCOUNTER — Ambulatory Visit (INDEPENDENT_AMBULATORY_CARE_PROVIDER_SITE_OTHER): Payer: Medicare Other | Admitting: Family Medicine

## 2019-01-08 DIAGNOSIS — L989 Disorder of the skin and subcutaneous tissue, unspecified: Secondary | ICD-10-CM

## 2019-01-08 DIAGNOSIS — I208 Other forms of angina pectoris: Secondary | ICD-10-CM

## 2019-01-08 NOTE — Telephone Encounter (Signed)
Called and spoke with patient who is reporting having a stabbing pain in her back that went around to her front on Saturday.  She reports it was the worst pain she has had since 1982.  A 10 or 11 on a scale from 1 to 10.  She had nausea and shortness of breath but no vomiting.  Some radiation to the right side of her neck. She took Tramadol without much relief.  She later tried SL ntg without relief.  She ended up just laying down and obtaining relief with rest.  Today she has had some discomfort under her breast after walking to the kitchen.  She has taken Tramadol (this AM) with relief but now c/o feeling weak.  Reviewed information with Dr Percival Spanish along with pt's cardiac history.  His recommendation is to add her as a virtual appt with him or an APP.  Will call patient to review the process, obtain consent and schedule.  Virtual visit appt scheduled for 12/4 with Dr Percival Spanish.  Pt does not know how to use her cell phone and prefers a phone call only.    OF NOTE:  Pt does not use her MyChart at all  [place] E-VISIT FOR YOUR APPOINTMENT - PLEASE REVIEW IMPORTANT INFORMATION BELOW SEVERAL DAYS PRIOR TO YOUR APPOINTMENT  Due to the recent COVID-19 pandemic, we are transitioning in-person office visits to tele-medicine visits in an effort to decrease unnecessary exposure to our patients, their families, and staff. These visits are billed to your insurance just like a normal visit is. We also encourage you to sign up for MyChart if you have not already done so. You will need a smartphone if possible. For patients that do not have this, we can still complete the visit using a regular telephone but do prefer a smartphone to enable video when possible. You may have a family member that lives with you that can help. If possible, we also ask that you have a blood pressure cuff and scale at home to measure your blood pressure, heart rate and weight prior to your scheduled appointment.  Patients with clinical needs that need an in-person evaluation and testing will still be able to come to the office if absolutely necessary. If you have any questions, feel free to call our office.  YOUR PROVIDER WILL BE USING THE FOLLOWING PLATFORM TO COMPLETE YOUR VISIT: Phone Call  2-3 DAYS BEFORE YOUR APPOINTMENT  You will receive a telephone call from one of our [place] team members - your caller ID may say "Unknown caller." If this is a video visit, we will walk you through how to get the video launched on your phone. We will remind you check your blood pressure, heart rate and weight prior to your scheduled appointment. If you have an Apple Watch or Kardia, please upload any pertinent ECG strips the day before or morning of your appointment to [place]. Our staff will also make sure you have reviewed the consent and agree to move forward with your scheduled tele-health visit.   THE DAY OF YOUR APPOINTMENT  Approximately 15 minutes prior to your scheduled appointment, you will receive a telephone call from one of [place] team - your caller ID may say "Unknown caller."  Our staff will confirm medications, vital signs for the day and any symptoms you may be experiencing. Please have this information available prior to the time of visit start. It may also be helpful for you to have a pad  of paper and pen handy for any instructions given during your visit. They will also walk you through joining the smartphone meeting if this is a video visit.  CONSENT FOR TELE-HEALTH VISIT - PLEASE REVIEW  I hereby voluntarily request, consent and authorize [facility] and its employed or contracted physicians, physician assistants, nurse practitioners or other licensed health care professionals (the Practitioner), to provide me with telemedicine health care services (the "Services") as deemed necessary by the treating Practitioner. I acknowledge and consent to receive the Services by the Practitioner via  telemedicine. I understand that the telemedicine visit will involve communicating with the Practitioner through live audiovisual communication technology and the disclosure of certain medical information by electronic transmission. I acknowledge that I have been given the opportunity to request an in-person assessment or other available alternative prior to the telemedicine visit and am voluntarily participating in the telemedicine visit.  I understand that I have the right to withhold or withdraw my consent to the use of telemedicine in the course of my care at any time, without affecting my right to future care or treatment, and that the Practitioner or I may terminate the telemedicine visit at any time. I understand that I have the right to inspect all information obtained and/or recorded in the course of the telemedicine visit and may receive copies of available information for a reasonable fee.  I understand that some of the potential risks of receiving the Services via telemedicine include:  [place] Delay or interruption in medical evaluation due to technological equipment failure or disruption; . Information transmitted may not be sufficient (e.g. poor resolution of images) to allow for appropriate medical decision making by the Practitioner; and/or  . In rare instances, security protocols could fail, causing a breach of personal health information.  Furthermore, I acknowledge that it is my responsibility to provide information about my medical history, conditions and care that is complete and accurate to the best of my ability. I acknowledge that Practitioner's advice, recommendations, and/or decision may be based on factors not within their control, such as incomplete or inaccurate data provided by me or distortions of diagnostic images or specimens that may result from electronic transmissions. I understand that the practice of medicine is not an exact science and that Practitioner makes no warranties or  guarantees regarding treatment outcomes. I acknowledge that I will receive a copy of this consent concurrently upon execution via email to the email address I last provided but may also request a printed copy by calling the office of [place].    I understand that my insurance will be billed for this visit.   I have read or had this consent read to me. . I understand the contents of this consent, which adequately explains the benefits and risks of the Services being provided via telemedicine.  . I have been provided ample opportunity to ask questions regarding this consent and the Services and have had my questions answered to my satisfaction. . I give my informed consent for the services to be provided through the use of telemedicine in my medical care  By participating in this telemedicine visit I agree to the above. Pt agreeable 01/08/2019

## 2019-01-08 NOTE — Telephone Encounter (Signed)
Received a handwritten note that pt had called into the [facility] office c/o having recent pain under her breasts with chest pain on Saturday. Recently seen and evaluated in October by Dr Percival Spanish - last cardiac cath 08/28/2014 demonstrated minimal CAD with presumed spasm 20% mLAD, 20% D2.  Had 14 day monitor d/t dizziness resulted 12/26/2018 demonstrating no significant arrhythmias to explain any dizziness.

## 2019-01-08 NOTE — Progress Notes (Signed)
Virtual Visit via Telephone Note  I connected with Amy Black on 01/08/19 at 3:57 PM by telephone and verified that I am speaking with the correct person using two identifiers. Amy Black is currently located at home and nobody is currently with her during this visit. The provider, Loman Brooklyn, FNP is located in their office at time of visit.  I discussed the limitations, risks, security and privacy concerns of performing an evaluation and management service by telephone and the availability of in person appointments. I also discussed with the patient that there may be a patient responsible charge related to this service. The patient expressed understanding and agreed to proceed.  Subjective: PCP: [name], MD  Chief Complaint  Patient presents with  . Cyst   Patient reports last night she was just sitting there and put her hand on her face and noticed that she had a " cyst" on the right side in front of her ear next to her sideburn.  She reports that it is firm and sore to the touch.  There is no erythema and it has no head on it.  She is not sure if it has been there previously as this is the first she has noticed it.  She did try to apply a warm compress once last night.   ROS: Per HPI  Current Outpatient Medications:  .  ALPRAZolam (XANAX) 0.5 MG tablet, Take 1 tablet (0.5 mg total) by mouth 3 (three) times daily., Disp: 90 tablet, Rfl: 5 .  aspirin EC 81 MG tablet, Take 81 mg by mouth daily., Disp: , Rfl:  .  Calcium Citrate-Vitamin D (CALCIUM CITRATE + D3) 315-250 MG-UNIT TABS, Take 2 tablets by mouth daily., Disp: 120 tablet, Rfl:  .  cyclobenzaprine (FLEXERIL) 5 MG tablet, TAKE ONE TABLET BY MOUTH TWICE DAILY FOR 14 DAYS, Disp: 30 tablet, Rfl: 1 .  FERREX 150 150 MG capsule, TAKE ONE CAPSULE BY MOUTH ONCE DAILY, Disp: 100 capsule, Rfl: 0 .  hydrOXYzine (ATARAX/VISTARIL) 25 MG tablet, Take 25-50 mg by mouth at bedtime as needed., Disp: , Rfl:  .  isosorbide  mononitrate (IMDUR) 120 MG 24 hr tablet, TAKE ONE TABLET BY MOUTH EVERY DAY, Disp: 90 tablet, Rfl: 0 .  loperamide (IMODIUM) 2 MG capsule, 1 capsule by mouth four times daily PRN, Disp: 120 capsule, Rfl: 0 .  nabumetone (RELAFEN) 500 MG tablet, TAKE ONE TABLET BY MOUTH TWICE DAILY FOR MUSCLE & JOINT PAIN, Disp: 180 tablet, Rfl: 1 .  nitroGLYCERIN (NITROSTAT) 0.4 MG SL tablet, DISSOLVE ONE TABLET UNDER TONGUE EVERY 5 MINUTES UP TO 3 DOSES AS NEEDED FOR CHEST PAIN, Disp: 25 tablet, Rfl: 5 .  ondansetron (ZOFRAN ODT) 4 MG disintegrating tablet, Take 1 tablet (4 mg total) by mouth every 8 (eight) hours as needed for nausea or vomiting., Disp: 20 tablet, Rfl: 0 .  pantoprazole (PROTONIX) 20 MG tablet, Take 1 tablet (20 mg total) by mouth daily., Disp: 30 tablet, Rfl: 6 .  PROCTOZONE-HC 2.5 % rectal cream, APPLY RECTALLY 2 TIMES A DAY AS DIRECTED, Disp: 30 g, Rfl: 0 .  traMADol (ULTRAM) 50 MG tablet, TAKE ONE TABLET BY MOUTH EVERY TWELVE HOURS AS NEEDED., Disp: 60 tablet, Rfl: 5 .  triamterene-hydrochlorothiazide (DYAZIDE) 37.5-25 MG capsule, TAKE ONE CAPSULE BY MOUTH 3 TIMES PER WEEK, Disp: 30 capsule, Rfl: 5 .  verapamil (CALAN-SR) 120 MG CR tablet, TAKE ONE TABLET BY MOUTH AT BEDTIME, Disp: 90 tablet, Rfl: 3  Allergies  Allergen  Reactions  . Iodine Itching  . Amlodipine Other (See Comments)    headaches  . Lexapro [Escitalopram Oxalate] Other (See Comments)    Drunk, High feeling  . Cymbalta [Duloxetine Hcl] Other (See Comments)    sleepiness  . Gabapentin Other (See Comments)    sleepiness  . Ivp Dye [Iodinated Diagnostic Agents] Itching  . Zanaflex [Tizanidine Hcl] Other (See Comments)    Very drunk feeling next day   Past Medical History:  Diagnosis Date  . Adenocarcinoma (HCC)    LEFT LEG  . Adenocarcinoma of unknown primary ([place]) 02/06/2011  . 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 Allergy   . Anxiety   . CAD (coronary artery disease)    a. minimal  CAD by cath in 2016 with presumed spasm (20% mLAD, 20% D2).  . Cataract    bilateral cataracts removed  . Coronary artery spasm ([place])   . Diverticulosis of colon (without mention of hemorrhage)   . GERD (gastroesophageal reflux disease)   . hepatic cyst   . Hip fracture, right ([place])   . History of cardiac catheterization   . History of nuclear stress test 04/2011   lexiscan; normal study, no significant ischemia, low risk; 2015 - normal  . HTN (hypertension)    PT. DENIES  . Hyperlipidemia   . Insomnia   . Irritable bowel syndrome   . Transverse myelitis ([place])   . Vitamin D deficiency     Observations/Objective: A&O  No respiratory distress or wheezing audible over the phone Mood, judgement, and thought processes all WNL   Assessment and Plan: 1. Skin lesion of face - Encouraged to apply warm compresses for 10 to 15 minutes several times throughout the day.  Cleanse face twice daily.  Contact us if it does not resolve or worsens.   Follow Up Instructions:  I discussed the assessment and treatment plan with the patient. The patient was provided an opportunity to ask questions and all were answered. The patient agreed with the plan and demonstrated an understanding of the instructions.   The patient was advised to call back or seek an in-person evaluation if the symptoms worsen or if the condition fails to improve as anticipated.  The above assessment and management plan was discussed with the patient. The patient verbalized understanding of and has agreed to the management plan. Patient is aware to call the clinic if symptoms persist or worsen. Patient is aware when to return to the clinic for a follow-up visit. Patient educated on when it is appropriate to go to the emergency department.   Time call ended: 4:02 PM  I provided 7 minutes of non-face-to-face time during this encounter.  Hendricks Limes, MSN, APRN, FNP-C [facility] 01/08/19

## 2019-01-09 DIAGNOSIS — M549 Dorsalgia, unspecified: Secondary | ICD-10-CM | POA: Insufficient documentation

## 2019-01-09 DIAGNOSIS — R42 Dizziness and giddiness: Secondary | ICD-10-CM

## 2019-01-09 HISTORY — DX: Dizziness and giddiness: R42

## 2019-01-09 NOTE — Progress Notes (Signed)
Virtual Visit via Telephone Note   This visit type was conducted due to national recommendations for restrictions regarding the COVID-19 Pandemic (e.g. social distancing) in an effort to limit this patient's exposure and mitigate transmission in our community.  Due to her co-morbid illnesses, this patient is at least at moderate risk for complications without adequate follow up.  This format is felt to be most appropriate for this patient at this time.  The patient did not have access to video technology/had technical difficulties with video requiring transitioning to audio format only (telephone).  All issues noted in this document were discussed and addressed.  No physical exam could be performed with this format.  Please refer to the patient's chart for her  consent to telehealth for [facility].   Date:  01/10/2019   ID:  [place], DOB 05-15-1940, MRN [id number]  Patient Location: Home Provider Location: Home  PCP:  [name], MD  Cardiologist:  Minus Breeding, MD  Electrophysiologist:  None   Evaluation Performed:  Follow-Up Visit  Chief Complaint:  Back pain  History of Present Illness:    Amy Black is a 78 y.o. female who presents for a follow up of presumed coronary spasm. On 08/28/2014, she had left heart cath that showed minimal CAD, 20% disease in LAD was normal LVEF. He also had history of palpitations and hypertension. She has been on verapamil for greater than 25 years.  She has had recurrent chest pain and has been treated with increased doses of Imdur.  She had relief of her pain after being taken off of verapamil and started on Cardizem.  She continued to have some symptoms as described and subsequently she was changed back to verapamil. She had chest pain when I last saw her and she I increased her Imdur.    She called because of stabbing back pain.  She was having back pain that was really around the left side.  It radiated around a little bit to the  front.  It was a sharp stabbing pain.  It would come and go.  She could not bring it on.  She was not having any associated symptoms such as nausea vomiting or diaphoresis.  She did not describe substernal chest pain, jaw or arm pain.  She said it has since resolved.  She says when she is up and moving around and being active in the house it is not bothering her  The patient does not have symptoms concerning for COVID-19 infection (fever, chills, cough, or new shortness of breath).    Past Medical History:  Diagnosis Date  . Adenocarcinoma (HCC)    LEFT LEG  . Adenocarcinoma of unknown primary ([place]) 02/06/2011  . 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 Allergy   . Anxiety   . CAD (coronary artery disease)    a. minimal CAD by cath in 2016 with presumed spasm (20% mLAD, 20% D2).  . Cataract    bilateral cataracts removed  . Coronary artery spasm ([place])   . Diverticulosis of colon (without mention of hemorrhage)   . GERD (gastroesophageal reflux disease)   . hepatic cyst   . Hip fracture, right ([place])   . History of cardiac catheterization   . History of nuclear stress test 04/2011   lexiscan; normal study, no significant ischemia, low risk; 2015 - normal  . HTN (hypertension)    PT. DENIES  . Hyperlipidemia   . Insomnia   .  Irritable bowel syndrome   . Transverse myelitis ([place])   . Vitamin D deficiency    Past Surgical History:  Procedure Laterality Date  . ABDOMINAL HYSTERECTOMY Bilateral   . CARDIAC CATHETERIZATION     coronary spasm  . CARDIAC CATHETERIZATION N/A 08/28/2014   Procedure: Left Heart Cath and Coronary Angiography;  Surgeon: Jettie Booze, MD;  Location: [place] [facility];  Service: Cardiovascular;  Laterality: N/A;  . COLONOSCOPY    . DIAGNOSTIC LAPAROSCOPY    . LYSIS OF ADHESION    . PELVIC LAPAROSCOPY  1992   RSO, AND LSO ON 2007  . RESECTION SOFT TISSUE TUMOR LEG / ANKLE RADICAL  2004   leg lesion resection & radiation   . SALPINGOOPHORECTOMY Left   . TOTAL HIP ARTHROPLASTY Right 10/04/2015   Procedure: TOTAL HIP ARTHROPLASTY ANTERIOR APPROACH;  Surgeon: Rod Can, MD;  Location: [facility];  Service: Orthopedics;  Laterality: Right;  . TRANSTHORACIC ECHOCARDIOGRAM  07/2012   LV cavity size mildly reduced, normal wall motion; MV with calcified annulus and mild MR; LA mildly dilated; atrial septum with increased thickness - lipomatous hypertrophy; RV systolic pressure increased (borderline pulm HTN)  . UPPER GASTROINTESTINAL ENDOSCOPY       Current Meds  Medication Sig  . ALPRAZolam (XANAX) 0.5 MG tablet Take 1 tablet (0.5 mg total) by mouth 3 (three) times daily.  Marland Kitchen aspirin EC 81 MG tablet Take 81 mg by mouth daily.  . Calcium Citrate-Vitamin D (CALCIUM CITRATE + D3) 315-250 MG-UNIT TABS Take 2 tablets by mouth daily.  . cyclobenzaprine (FLEXERIL) 5 MG tablet TAKE ONE TABLET BY MOUTH TWICE DAILY FOR 14 DAYS  . FERREX 150 150 MG capsule TAKE ONE CAPSULE BY MOUTH ONCE DAILY  . hydrOXYzine (ATARAX/VISTARIL) 25 MG tablet Take 25-50 mg by mouth at bedtime as needed.  . isosorbide mononitrate (IMDUR) 120 MG 24 hr tablet TAKE ONE TABLET BY MOUTH EVERY DAY  . loperamide (IMODIUM) 2 MG capsule 1 capsule by mouth four times daily PRN  . nabumetone (RELAFEN) 500 MG tablet TAKE ONE TABLET BY MOUTH TWICE DAILY FOR MUSCLE & JOINT PAIN  . nitroGLYCERIN (NITROSTAT) 0.4 MG SL tablet DISSOLVE ONE TABLET UNDER TONGUE EVERY 5 MINUTES UP TO 3 DOSES AS NEEDED FOR CHEST PAIN  . ondansetron (ZOFRAN ODT) 4 MG disintegrating tablet Take 1 tablet (4 mg total) by mouth every 8 (eight) hours as needed for nausea or vomiting.  . pantoprazole (PROTONIX) 20 MG tablet Take 1 tablet (20 mg total) by mouth daily.  Marland Kitchen PROCTOZONE-HC 2.5 % rectal cream APPLY RECTALLY 2 TIMES A DAY AS DIRECTED  . traMADol (ULTRAM) 50 MG tablet TAKE ONE TABLET BY MOUTH EVERY TWELVE HOURS AS NEEDED.  Marland Kitchen triamterene-hydrochlorothiazide (DYAZIDE) 37.5-25 MG capsule TAKE  ONE CAPSULE BY MOUTH 3 TIMES PER WEEK  . verapamil (CALAN-SR) 120 MG CR tablet TAKE ONE TABLET BY MOUTH AT BEDTIME     Allergies:   Iodine, Amlodipine, Lexapro [escitalopram oxalate], Cymbalta [duloxetine hcl], Gabapentin, Ivp dye [iodinated diagnostic agents], and Zanaflex [tizanidine hcl]   Social History   Tobacco Use  . Smoking status: Former Smoker    Packs/day: 0.50    Years: 4.00    Pack years: 2.00    Types: Cigarettes    Start date: 12/07/1968    Quit date: 11/20/1972    Years since quitting: 46.1  . Smokeless tobacco: Never Used  . Tobacco comment: quit 40 years ago  Substance Use Topics  . Alcohol use: No  Alcohol/week: 0.0 standard drinks  . Drug use: No     Family Hx: The patient's family history includes Bladder Cancer in her mother; Coronary artery disease in an other family member; Depression in her sister; Diabetes in her brother and another family member; Heart disease in her father and mother; Hyperlipidemia in her sister; Hypertension in her brother and mother; Seizures in her brother; Stroke in her father. There is no history of Colon cancer, Esophageal cancer, Pancreatic cancer, Rectal cancer, or Stomach cancer.  ROS:   Please see the history of present illness.     All other systems reviewed and are negative.   Prior CV studies:   The following studies were reviewed today:  None  Labs/Other Tests and Data Reviewed:    EKG:  No ECG reviewed.  Recent Labs: 10/28/2018: ALT 10; BUN 17; Creatinine 0.86; Hemoglobin 11.5; Platelet Count 384; Potassium 5.1; Sodium 141 11/15/2018: TSH 0.717   Recent Lipid Panel Lab Results  Component Value Date/Time   CHOL 192 06/18/2018 04:37 PM   CHOL 203 (H) 08/29/2012 01:16 PM   TRIG 116 06/18/2018 04:37 PM   TRIG 131 08/29/2012 01:16 PM   HDL 75 06/18/2018 04:37 PM   HDL 68 08/29/2012 01:16 PM   CHOLHDL 2.6 06/18/2018 04:37 PM   LDLCALC 94 06/18/2018 04:37 PM   LDLCALC 109 (H) 08/29/2012 01:16 PM    Wt  Readings from Last 3 Encounters:  01/10/19 152 lb (68.9 kg)  11/15/18 151 lb (68.5 kg)  10/28/18 150 lb 12.8 oz (68.4 kg)     Objective:    Vital Signs:  BP 127/77   Pulse 77   Ht 5\' 3"  (1.6 m)   Wt 152 lb (68.9 kg)   BMI 26.93 kg/m    VITAL SIGNS:  reviewed  ASSESSMENT & PLAN:     Back pain: This was atypical.  It seems to have resolved.  No further work-up.  Coronary Spasm:  She is not had any substernal chest pain.  No change in therapy.  HTN:   Her blood pressure is well controlled.  No change in therapy.   Palpitations:   She has had no symptomatic tachypalpitations.   She did wear an event monitor earlier this year without any significant arrhythmias.  No further work-up.  I did review this today for this appointment.  Dizziness:   She has some mild dizziness.  She had some mild orthostatic symptoms previously.  She has been counseled and educated about this.  She is not had any syncope.  The symptoms are not any worse and they seem to be mild.  No change in therapy.   COVID-19 Education: The signs and symptoms of COVID-19 were discussed with the patient and how to seek care for testing (follow up with PCP or arrange E-visit).  We talked about a vaccine and she will think about it.  She does not want to get the flu vaccine.  The importance of social distancing was discussed today.  Time:   Today, I have spent 25 minutes with the patient with telehealth technology discussing the above problems.  Greater than half the time with the patient.  Remainder of the time with the chart and reviewing previous study results including the monitor.   Medication Adjustments/Labs and Tests Ordered: Current medicines are reviewed at length with the patient today.  Concerns regarding medicines are outlined above.   Tests Ordered: No orders of the defined types were placed in this encounter.   Medication  Changes: No orders of the defined types were placed in this encounter.    Follow Up:  In Person in one year  Signed, Minus Breeding, MD  01/10/2019 10:24 AM    [place]

## 2019-01-10 ENCOUNTER — Encounter: Payer: Self-pay | Admitting: Cardiology

## 2019-01-10 ENCOUNTER — Telehealth (INDEPENDENT_AMBULATORY_CARE_PROVIDER_SITE_OTHER): Payer: Medicare Other | Admitting: Cardiology

## 2019-01-10 ENCOUNTER — Encounter: Payer: Self-pay | Admitting: Family Medicine

## 2019-01-10 ENCOUNTER — Ambulatory Visit (INDEPENDENT_AMBULATORY_CARE_PROVIDER_SITE_OTHER): Payer: Medicare Other | Admitting: Family Medicine

## 2019-01-10 VITALS — BP 127/77 | HR 77 | Ht 63.0 in | Wt 152.0 lb

## 2019-01-10 DIAGNOSIS — I208 Other forms of angina pectoris: Secondary | ICD-10-CM

## 2019-01-10 DIAGNOSIS — R1084 Generalized abdominal pain: Secondary | ICD-10-CM | POA: Diagnosis not present

## 2019-01-10 DIAGNOSIS — R42 Dizziness and giddiness: Secondary | ICD-10-CM

## 2019-01-10 DIAGNOSIS — R002 Palpitations: Secondary | ICD-10-CM

## 2019-01-10 DIAGNOSIS — I10 Essential (primary) hypertension: Secondary | ICD-10-CM

## 2019-01-10 DIAGNOSIS — M549 Dorsalgia, unspecified: Secondary | ICD-10-CM

## 2019-01-10 MED ORDER — ISOSORBIDE MONONITRATE ER 120 MG PO TB24: 120.0000 mg | ORAL_TABLET | Freq: Every day | ORAL | 3 refills | Status: DC

## 2019-01-10 NOTE — Patient Instructions (Signed)
  Medication Instructions:  Your physician recommends that you continue on your current medications as directed. Please refer to the Current Medication list given to you today.  Your prescription refill for Imdur has been sent to your pharmacy for pickup *If you need a refill on your cardiac medications before your next appointment, please call your pharmacy*  Lab Work: none If you have labs (blood work) drawn today and your tests are completely normal, you will receive your results only by: Marland Kitchen MyChart Message (if you have MyChart) OR . A paper copy in the mail If you have any lab test that is abnormal or we need to change your treatment, we will call you to review the results.  Testing/Procedures: none  Follow-Up: At [facility], you and your health needs are our priority.  As part of our continuing mission to provide you with exceptional heart care, we have created designated Provider Care Teams.  These Care Teams include your primary Cardiologist (physician) and Advanced Practice Providers (APPs -  Physician Assistants and Nurse Practitioners) who all work together to provide you with the care you need, when you need it.  Your next appointment:   12 month(s)  The format for your next appointment:   In Person  Provider:   Minus Breeding, MD

## 2019-01-10 NOTE — Progress Notes (Signed)
Subjective:    Patient ID: Amy Black, female    DOB: 1940-11-06, 78 y.o.   MRN: [id number]   HPI: Amy Black is a 78 y.o. female presenting for  Abdomen and across stomach and back.Moderately severe with cramping and aching. Eating can exacerbate. BM can relieve. Cramps real bad for a little bit, for a few minutes. It just hurts sometimes. Causes nausea too. Sometimes vomiting and diarrhea. Onset was 2-3 years ago and getting worse over the last few weeks. .   Depression screen [facility] 2/9 09/25/2018 09/10/2018 06/18/2018 04/15/2018 12/18/2017  Decreased Interest 0 0 0 0 0  Down, Depressed, Hopeless 0 0 0 0 0  PHQ - 2 Score 0 0 0 0 0  Altered sleeping - - 0 - -  Tired, decreased energy - - 0 - -  Change in appetite - - 0 - -  Feeling bad or failure about yourself  - - 0 - -  Trouble concentrating - - 0 - -  Moving slowly or fidgety/restless - - 0 - -  Suicidal thoughts - - 0 - -  PHQ-9 Score - - 0 - -  Some recent data might be hidden     Relevant past medical, surgical, family and social history reviewed and updated as indicated.  Interim medical history since our last visit reviewed. Allergies and medications reviewed and updated.  ROS:  Review of Systems  Constitutional: Positive for appetite change.  HENT: Negative for congestion.   Respiratory: Negative for shortness of breath.   Cardiovascular: Negative for chest pain.  Gastrointestinal: Positive for constipation. Negative for abdominal pain, diarrhea, nausea and vomiting.  Genitourinary: Negative for difficulty urinating.  Neurological: Negative for headaches.  Psychiatric/Behavioral: Negative for sleep disturbance.     Social History   Tobacco Use  Smoking Status Former Smoker  . Packs/day: 0.50  . Years: 4.00  . Pack years: 2.00  . Types: Cigarettes  . Start date: 12/07/1968  . Quit date: 11/20/1972  . Years since quitting: 46.1  Smokeless Tobacco Never Used  Tobacco Comment   quit 40 years ago       Objective:     Wt Readings from Last 3 Encounters:  01/10/19 152 lb (68.9 kg)  11/15/18 151 lb (68.5 kg)  10/28/18 150 lb 12.8 oz (68.4 kg)     Exam deferred. Pt. Harboring due to COVID 19. Phone visit performed.   Assessment & Plan:   1. Generalized abdominal pain     No orders of the defined types were placed in this encounter.   Orders Placed This Encounter  Procedures  . CT Abdomen Pelvis W Contrast    Standing Status:   Future    Standing Expiration Date:   04/09/2020    Order Specific Question:   ** REASON FOR EXAM (FREE TEXT)    Answer:   NVD &  cramping diffusely, intermittently, getting worse    Order Specific Question:   If indicated for the ordered procedure, I authorize the administration of contrast media per Radiology protocol    Answer:   Yes    Order Specific Question:   Preferred imaging location?    Answer:   [facility]    Order Specific Question:   Is Oral Contrast requested for this exam?    Answer:   Yes, Per Radiology protocol    Order Specific Question:   Radiology Contrast Protocol - do NOT remove file path    Answer:   \\  [email or web address]      Diagnoses and all orders for this visit:  Generalized abdominal pain -     CT Abdomen Pelvis W Contrast; Future    Virtual Visit via telephone Note  I discussed the limitations, risks, security and privacy concerns of performing an evaluation and management service by telephone and the availability of in person appointments. The patient was identified with two identifiers. Pt.expressed understanding and agreed to proceed. Pt. Is at home. Dr. Livia Snellen is in his office.  Follow Up Instructions:   I discussed the assessment and treatment plan with the patient. The patient was provided an opportunity to ask questions and all were answered. The patient agreed with the plan and demonstrated an understanding of the instructions.   The patient was advised to call back or  seek an in-person evaluation if the symptoms worsen or if the condition fails to improve as anticipated.   Total minutes including chart review and phone contact time: 22   Follow up plan: Return in about 2 weeks (around 01/24/2019).  [name], MD [place]

## 2019-01-13 ENCOUNTER — Other Ambulatory Visit: Payer: Self-pay | Admitting: Family Medicine

## 2019-01-13 DIAGNOSIS — I10 Essential (primary) hypertension: Secondary | ICD-10-CM

## 2019-01-27 ENCOUNTER — Other Ambulatory Visit: Payer: Medicare Other

## 2019-01-27 ENCOUNTER — Other Ambulatory Visit: Payer: Self-pay

## 2019-01-27 DIAGNOSIS — I10 Essential (primary) hypertension: Secondary | ICD-10-CM | POA: Diagnosis not present

## 2019-01-28 LAB — BMP8+EGFR
BUN/Creatinine Ratio: 19 (ref 12–28)
BUN: 15 mg/dL (ref 8–27)
CO2: 25 mmol/L (ref 20–29)
Calcium: 10 mg/dL (ref 8.7–10.3)
Chloride: 99 mmol/L (ref 96–106)
Creatinine, Ser: 0.81 mg/dL (ref 0.57–1.00)
GFR calc Af Amer: 82 mL/min/{1.73_m2} (ref >59)
GFR calc non Af Amer: 71 mL/min/{1.73_m2} (ref >59)
Glucose: 84 mg/dL (ref 65–99)
Potassium: 4.4 mmol/L (ref 3.5–5.2)
Sodium: 139 mmol/L (ref 134–144)

## 2019-01-28 NOTE — Telephone Encounter (Signed)
Hello Shatisha,  Your lab result is normal and/or stable.Some minor variations that are not significant are commonly marked abnormal, but do not represent any medical problem for you.  Best regards, Bryden Darden, M.D.

## 2019-02-03 ENCOUNTER — Other Ambulatory Visit: Payer: Self-pay

## 2019-02-03 ENCOUNTER — Ambulatory Visit (HOSPITAL_COMMUNITY)
Admission: RE | Admit: 2019-02-03 | Discharge: 2019-02-03 | Disposition: A | Discharge status: Home / Self Care | Payer: Medicare Other | Source: Ambulatory Visit | Attending: Family Medicine | Admitting: Family Medicine

## 2019-02-03 DIAGNOSIS — R109 Unspecified abdominal pain: Secondary | ICD-10-CM | POA: Diagnosis not present

## 2019-02-03 DIAGNOSIS — R1084 Generalized abdominal pain: Secondary | ICD-10-CM | POA: Insufficient documentation

## 2019-02-03 MED ORDER — IOHEXOL 300 MG/ML  SOLN
100.0000 mL | Freq: Once | INTRAMUSCULAR | Status: AC | PRN
  Administered 2019-02-03: 100 mL via INTRAVENOUS

## 2019-02-24 ENCOUNTER — Ambulatory Visit (INDEPENDENT_AMBULATORY_CARE_PROVIDER_SITE_OTHER): Payer: Medicare Other | Admitting: Physician Assistant

## 2019-02-24 ENCOUNTER — Encounter: Payer: Self-pay | Admitting: Physician Assistant

## 2019-02-24 DIAGNOSIS — J0100 Acute maxillary sinusitis, unspecified: Secondary | ICD-10-CM | POA: Diagnosis not present

## 2019-02-24 MED ORDER — AMOXICILLIN 500 MG PO CAPS: 500.0000 mg | ORAL_CAPSULE | Freq: Three times a day (TID) | ORAL | 0 refills | Status: DC

## 2019-02-24 MED ORDER — FLUTICASONE PROPIONATE 50 MCG/ACT NA SUSP: 2.0000 | Freq: Every day | NASAL | 9 refills | Status: DC

## 2019-02-24 NOTE — Progress Notes (Signed)
Telephone visit  Subjective: [id number] pain 2 weeks PCP: [name], MD [id number] Amy Black is a 79 y.o. female calls for telephone consult today. Patient provides verbal consent for consult held via phone.  Patient is identified with 2 separate identifiers.  At this time the entire area is on COVID-19 social distancing and stay home orders are in place.  Patient is of higher risk and therefore we are performing this by a virtual method.  Location of patient: home Location of provider: [facility] Others present for call: no  /This patient has had many 2 weeks of sinus headache and postnasal drainage. There is copious drainage at times. Denies any fever at this time. There has been a history of sinus infections in the past.  No history of sinus surgery. There is cough at night. It has become more prevalent in recent days.  She also tried Mucinex without any relief.   ROS: Per HPI  Allergies  Allergen Reactions  . Iodine Itching  . Amlodipine Other (See Comments)    headaches  . Lexapro [Escitalopram Oxalate] Other (See Comments)    Drunk, High feeling  . Cymbalta [Duloxetine Hcl] Other (See Comments)    sleepiness  . Gabapentin Other (See Comments)    sleepiness  . Ivp Dye [Iodinated Diagnostic Agents] Itching  . Zanaflex [Tizanidine Hcl] Other (See Comments)    Very drunk feeling next day   Past Medical History:  Diagnosis Date  . Adenocarcinoma (HCC)    LEFT LEG  . Adenocarcinoma of unknown primary ([place]) 02/06/2011  . 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 Allergy   . Anxiety   . CAD (coronary artery disease)    a. minimal CAD by cath in 2016 with presumed spasm (20% mLAD, 20% D2).  . Cataract    bilateral cataracts removed  . Coronary artery spasm ([place])   . Diverticulosis of colon (without mention of hemorrhage)   . GERD (gastroesophageal reflux disease)   . hepatic cyst   . Hip fracture, right ([place])   . History of cardiac  catheterization   . History of nuclear stress test 04/2011   lexiscan; normal study, no significant ischemia, low risk; 2015 - normal  . HTN (hypertension)    PT. DENIES  . Hyperlipidemia   . Insomnia   . Irritable bowel syndrome   . Transverse myelitis ([place])   . Vitamin D deficiency     Current Outpatient Medications:  .  ALPRAZolam (XANAX) 0.5 MG tablet, Take 1 tablet (0.5 mg total) by mouth 3 (three) times daily., Disp: 90 tablet, Rfl: 5 .  amoxicillin (AMOXIL) 500 MG capsule, Take 1 capsule (500 mg total) by mouth 3 (three) times daily., Disp: 30 capsule, Rfl: 0 .  aspirin EC 81 MG tablet, Take 81 mg by mouth daily., Disp: , Rfl:  .  Calcium Citrate-Vitamin D (CALCIUM CITRATE + D3) 315-250 MG-UNIT TABS, Take 2 tablets by mouth daily., Disp: 120 tablet, Rfl:  .  cyclobenzaprine (FLEXERIL) 5 MG tablet, TAKE ONE TABLET BY MOUTH TWICE DAILY FOR 14 DAYS, Disp: 30 tablet, Rfl: 1 .  FERREX 150 150 MG capsule, TAKE ONE CAPSULE BY MOUTH ONCE DAILY, Disp: 100 capsule, Rfl: 0 .  fluticasone (FLONASE) 50 MCG/ACT nasal spray, Place 2 sprays into both nostrils daily., Disp: 16 g, Rfl: 9 .  hydrOXYzine (ATARAX/VISTARIL) 25 MG tablet, Take 25-50 mg by mouth at bedtime as needed., Disp: , Rfl:  .  isosorbide mononitrate (IMDUR) 120 MG 24 hr tablet, Take 1 tablet (120 mg total) by mouth daily., Disp: 90 tablet, Rfl: 3 .  loperamide (IMODIUM) 2 MG capsule, 1 capsule by mouth four times daily PRN, Disp: 120 capsule, Rfl: 0 .  nabumetone (RELAFEN) 500 MG tablet, TAKE ONE TABLET BY MOUTH TWICE DAILY FOR MUSCLE & JOINT PAIN, Disp: 180 tablet, Rfl: 1 .  nitroGLYCERIN (NITROSTAT) 0.4 MG SL tablet, DISSOLVE ONE TABLET UNDER TONGUE EVERY 5 MINUTES UP TO 3 DOSES AS NEEDED FOR CHEST PAIN, Disp: 25 tablet, Rfl: 5 .  ondansetron (ZOFRAN ODT) 4 MG disintegrating tablet, Take 1 tablet (4 mg total) by mouth every 8 (eight) hours as needed for nausea or vomiting., Disp: 20 tablet, Rfl: 0 .  pantoprazole (PROTONIX) 20 MG  tablet, Take 1 tablet (20 mg total) by mouth daily., Disp: 30 tablet, Rfl: 6 .  PROCTOZONE-HC 2.5 % rectal cream, APPLY RECTALLY 2 TIMES A DAY AS DIRECTED, Disp: 30 g, Rfl: 0 .  traMADol (ULTRAM) 50 MG tablet, TAKE ONE TABLET BY MOUTH EVERY TWELVE HOURS AS NEEDED., Disp: 60 tablet, Rfl: 5 .  triamterene-hydrochlorothiazide (DYAZIDE) 37.5-25 MG capsule, TAKE ONE CAPSULE BY MOUTH 3 TIMES PER WEEK, Disp: 30 capsule, Rfl: 5 .  verapamil (CALAN-SR) 120 MG CR tablet, TAKE ONE TABLET BY MOUTH AT BEDTIME, Disp: 90 tablet, Rfl: 3  Assessment/ Plan: 79 y.o. female   1. Acute non-recurrent maxillary sinusitis - amoxicillin (AMOXIL) 500 MG capsule; Take 1 capsule (500 mg total) by mouth 3 (three) times daily.  Dispense: 30 capsule; Refill: 0 - fluticasone (FLONASE) 50 MCG/ACT nasal spray; Place 2 sprays into both nostrils daily.  Dispense: 16 g; Refill: 9   No follow-ups on file.  Continue all other maintenance medications as listed above.  Start time: 2:20 PM End time: 2:27 PM  Meds ordered this encounter  Medications  . amoxicillin (AMOXIL) 500 MG capsule    Sig: Take 1 capsule (500 mg total) by mouth 3 (three) times daily.    Dispense:  30 capsule    Refill:  0    Order Specific Question:   Supervising Provider    Answer:   Janora Norlander [id number]  . fluticasone (FLONASE) 50 MCG/ACT nasal spray    Sig: Place 2 sprays into both nostrils daily.    Dispense:  16 g    Refill:  9    Order Specific Question:   Supervising Provider    Answer:   Janora Norlander [id number]    Particia Nearing PA-C [place] [phone]

## 2019-03-12 ENCOUNTER — Other Ambulatory Visit: Payer: Self-pay | Admitting: Family Medicine

## 2019-04-02 ENCOUNTER — Telehealth: Payer: Self-pay | Admitting: Cardiology

## 2019-04-02 NOTE — Telephone Encounter (Signed)
Pt c/o medication issue:  1. Name of Medication: pantoprazole (PROTONIX) 20 MG tablet  2. How are you currently taking this medication (dosage and times per day)? once a day  3. Are you having a reaction (difficulty breathing--STAT)? no  4. What is your medication issue? Patient states she was taking 60mg  of the medication and would like to go back to that. She states the 20mg  is not working for her and she has been having trouble resting.

## 2019-04-02 NOTE — Telephone Encounter (Signed)
Pt states that she used to take 60mg  Protonix daily and once her PCP retired Verdell Carmine, NP prescribed her 20mg  daily Protonix. Pt states she has been having difficulty resting, laying down and struggles with heart burn. She thinks she would feel better if she could go back to 60mg  daily. Will route to [facility] for advisement.

## 2019-04-02 NOTE — Telephone Encounter (Signed)
Okay to take Protonix 40 mg in the am and 20 mg in pm.She will need to see GI for follow up of symptoms

## 2019-04-02 NOTE — Telephone Encounter (Signed)
Pt aware of KTs recommendations:   Okay to take Protonix 40 mg in the am and 20 mg in pm.She will need to see GI for follow up of symptoms   Pt verbalized understanding and is aware to call back with any other concerns.

## 2019-04-03 ENCOUNTER — Other Ambulatory Visit: Payer: Self-pay

## 2019-04-03 ENCOUNTER — Encounter: Payer: Self-pay | Admitting: Family Medicine

## 2019-04-03 ENCOUNTER — Ambulatory Visit (INDEPENDENT_AMBULATORY_CARE_PROVIDER_SITE_OTHER): Payer: Medicare Other | Admitting: Family Medicine

## 2019-04-03 VITALS — BP 139/78 | HR 73 | Temp 98.7°F | Ht 63.0 in | Wt 152.4 lb

## 2019-04-03 DIAGNOSIS — M5416 Radiculopathy, lumbar region: Secondary | ICD-10-CM

## 2019-04-03 DIAGNOSIS — F411 Generalized anxiety disorder: Secondary | ICD-10-CM

## 2019-04-03 DIAGNOSIS — R52 Pain, unspecified: Secondary | ICD-10-CM

## 2019-04-03 MED ORDER — TRAMADOL HCL 50 MG PO TABS: ORAL_TABLET | ORAL | 5 refills | Status: DC

## 2019-04-03 MED ORDER — TRIAMTERENE-HCTZ 37.5-25 MG PO CAPS: ORAL_CAPSULE | ORAL | 5 refills | Status: DC

## 2019-04-03 MED ORDER — ALPRAZOLAM 0.5 MG PO TABS: 0.5000 mg | ORAL_TABLET | Freq: Three times a day (TID) | ORAL | 5 refills | Status: DC

## 2019-04-03 NOTE — Addendum Note (Signed)
Addended by: Wardell Heath on: 04/03/2019 03:26 PM   Modules accepted: Orders

## 2019-04-03 NOTE — Progress Notes (Signed)
Subjective:  Patient ID: Amy Black, female    DOB: [birth year]  Age: 79 y.o. MRN: [id number]  CC: Medication Refill   HPI [name] presents for recheck on her use of tramadol for pain.  She uses it primarily as needed for low back pain.  The back pain is moderately severe at times.  It is intermittent.  It does radiate down the left leg at times.  It has started flaring recently.  She has not needed it in several weeks.  PDMP review showed no inappropriate use of the medication no early refills and no use of multiple prescribers or pharmacies.  Her morphine milligram equivalent is 10 she does not exceed 2 a day.  Her last fill was for 60 tablets approximately 3 months ago.  Patient does use Xanax 3 times a day every day.  This is for ongoing anxiety.  She is doing well mild but if she misses her Xanax she will get very agitated.  She has been a long-term user of the medication will increase in the dose and several years.   Depression screen [facility] 2/9 04/03/2019 09/25/2018 09/10/2018  Decreased Interest 0 0 0  Down, Depressed, Hopeless 0 0 0  PHQ - 2 Score 0 0 0  Altered sleeping - - -  Tired, decreased energy - - -  Change in appetite - - -  Feeling bad or failure about yourself  - - -  Trouble concentrating - - -  Moving slowly or fidgety/restless - - -  Suicidal thoughts - - -  PHQ-9 Score - - -  Some recent data might be hidden    History Adileigh has a past medical history of Adenocarcinoma ([place]), Adenocarcinoma of unknown primary ([place]) (02/06/2011), Adenosquamous carcinoma, Allergy, Anxiety, CAD (coronary artery disease), Cataract, Coronary artery spasm ([place]), Diverticulosis of colon (without mention of hemorrhage), GERD (gastroesophageal reflux disease), hepatic cyst, Hip fracture, right ([place]), History of cardiac catheterization, History of nuclear stress test (04/2011), HTN (hypertension), Hyperlipidemia, Insomnia, Irritable bowel syndrome, Transverse myelitis ([place]), and  Vitamin D deficiency.   She has a past surgical history that includes Resection soft tissue tumor leg / ankle radical (2004); Diagnostic laparoscopy; Lysis of adhesion; Salpingoophorectomy (Left); Pelvic laparoscopy (1992); Cardiac catheterization; transthoracic echocardiogram (07/2012); Cardiac catheterization (N/A, 08/28/2014); Colonoscopy; Upper gastrointestinal endoscopy; Total hip arthroplasty (Right, 10/04/2015); and Abdominal hysterectomy (Bilateral).   Her family history includes Bladder Cancer in her mother; Coronary artery disease in an other family member; Depression in her sister; Diabetes in her brother and another family member; Heart disease in her father and mother; Hyperlipidemia in her sister; Hypertension in her brother and mother; Seizures in her brother; Stroke in her father.She reports that she quit smoking about 46 years ago. Her smoking use included cigarettes. She started smoking about 50 years ago. She has a 2.00 pack-year smoking history. She has never used smokeless tobacco. She reports that she does not drink alcohol or use drugs.    ROS Review of Systems  Constitutional: Negative.   HENT: Negative.   Eyes: Negative for visual disturbance.  Respiratory: Negative for shortness of breath.   Cardiovascular: Negative for chest pain.  Gastrointestinal: Negative for abdominal pain.  Musculoskeletal: Negative for arthralgias.    Objective:  BP 139/78   Pulse 73   Temp 98.7 F (37.1 C) (Temporal)   Ht 5\' 3"  (1.6 m)   Wt 152 lb 6.4 oz (69.1 kg)   BMI 27.00 kg/m   BP Readings from Last  3 Encounters:  04/03/19 139/78  01/10/19 127/77  11/15/18 (!) 153/88    Wt Readings from Last 3 Encounters:  04/03/19 152 lb 6.4 oz (69.1 kg)  01/10/19 152 lb (68.9 kg)  11/15/18 151 lb (68.5 kg)     Physical Exam Constitutional:      General: She is not in acute distress.    Appearance: She is well-developed.  Cardiovascular:     Rate and Rhythm: Normal rate and regular  rhythm.  Pulmonary:     Breath sounds: Normal breath sounds.  Skin:    General: Skin is warm and dry.  Neurological:     Mental Status: She is alert and oriented to person, place, and time.       Assessment & Plan:   Karuna was seen today for medication refill.  Diagnoses and all orders for this visit:  Lumbar radiculopathy -     ToxASSURE Select 13 (MW), Urine  Pain management -     ToxASSURE Select 13 (MW), Urine  GAD (generalized anxiety disorder) -     ToxASSURE Select 13 (MW), Urine  Other orders -     ALPRAZolam (XANAX) 0.5 MG tablet; Take 1 tablet (0.5 mg total) by mouth 3 (three) times daily. -     traMADol (ULTRAM) 50 MG tablet; TAKE ONE TABLET BY MOUTH EVERY TWELVE HOURS AS NEEDED. -     triamterene-hydrochlorothiazide (DYAZIDE) 37.5-25 MG capsule; TAKE ONE CAPSULE BY MOUTH 3 TIMES PER WEEK       I have discontinued Lorenz Coaster. Martino's Proctozone-HC and amoxicillin. I am also having her maintain her Calcium Citrate-Vitamin D, nitroGLYCERIN, aspirin EC, cyclobenzaprine, verapamil, pantoprazole, Ferrex 150, nabumetone, ondansetron, loperamide, hydrOXYzine, isosorbide mononitrate, fluticasone, ALPRAZolam, traMADol, and triamterene-hydrochlorothiazide.  Allergies as of 04/03/2019      Reactions   Iodine Itching   Amlodipine Other (See Comments)   headaches   Lexapro [escitalopram Oxalate] Other (See Comments)   Drunk, High feeling   Cymbalta [duloxetine Hcl] Other (See Comments)   sleepiness   Gabapentin Other (See Comments)   sleepiness   Ivp Dye [iodinated Diagnostic Agents] Itching   Zanaflex [tizanidine Hcl] Other (See Comments)   Very drunk feeling next day      Medication List       Accurate as of April 03, 2019  2:56 PM. If you have any questions, ask your nurse or doctor.        STOP taking these medications   amoxicillin 500 MG capsule Commonly known as: AMOXIL Stopped by: [name], MD   Proctozone-HC 2.5 % rectal  cream Generic drug: hydrocortisone Stopped by: [name], MD     TAKE these medications   ALPRAZolam 0.5 MG tablet Commonly known as: XANAX Take 1 tablet (0.5 mg total) by mouth 3 (three) times daily.   aspirin EC 81 MG tablet Take 81 mg by mouth daily.   Calcium Citrate-Vitamin D 315-250 MG-UNIT Tabs Commonly known as: Calcium Citrate + D3 Take 2 tablets by mouth daily.   cyclobenzaprine 5 MG tablet Commonly known as: FLEXERIL TAKE ONE TABLET BY MOUTH TWICE DAILY FOR 14 DAYS   Ferrex 150 150 MG capsule Generic drug: iron polysaccharides TAKE ONE CAPSULE BY MOUTH ONCE DAILY   fluticasone 50 MCG/ACT nasal spray Commonly known as: FLONASE Place 2 sprays into both nostrils daily.   hydrOXYzine 25 MG tablet Commonly known as: ATARAX/VISTARIL Take 25-50 mg by mouth at bedtime as needed.   isosorbide mononitrate 120 MG 24 hr tablet Commonly known  as: IMDUR Take 1 tablet (120 mg total) by mouth daily.   loperamide 2 MG capsule Commonly known as: IMODIUM 1 capsule by mouth four times daily PRN   nabumetone 500 MG tablet Commonly known as: RELAFEN TAKE ONE TABLET BY MOUTH TWICE DAILY FOR MUSCLE & JOINT PAIN   nitroGLYCERIN 0.4 MG SL tablet Commonly known as: Nitrostat DISSOLVE ONE TABLET UNDER TONGUE EVERY 5 MINUTES UP TO 3 DOSES AS NEEDED FOR CHEST PAIN   ondansetron 4 MG disintegrating tablet Commonly known as: Zofran ODT Take 1 tablet (4 mg total) by mouth every 8 (eight) hours as needed for nausea or vomiting.   pantoprazole 20 MG tablet Commonly known as: Protonix Take 1 tablet (20 mg total) by mouth daily.   traMADol 50 MG tablet Commonly known as: ULTRAM TAKE ONE TABLET BY MOUTH EVERY TWELVE HOURS AS NEEDED.   triamterene-hydrochlorothiazide 37.5-25 MG capsule Commonly known as: DYAZIDE TAKE ONE CAPSULE BY MOUTH 3 TIMES PER WEEK   verapamil 120 MG CR tablet Commonly known as: CALAN-SR TAKE ONE TABLET BY MOUTH AT BEDTIME        Follow-up:  Return in about 3 months (around 07/01/2019).  [name], M.D.

## 2019-04-07 LAB — TOXASSURE SELECT 13 (MW), URINE

## 2019-04-08 ENCOUNTER — Telehealth: Payer: Self-pay | Admitting: Adult Health

## 2019-04-08 MED ORDER — PANTOPRAZOLE SODIUM 20 MG PO TBEC: DELAYED_RELEASE_TABLET | ORAL | 0 refills | Status: DC

## 2019-04-08 NOTE — Telephone Encounter (Signed)
   Pt c/o medication issue:  1. Name of Medication: pantoprazole (PROTONIX) 20 MG tablet  2. How are you currently taking this medication (dosage and times per day)?   3. Are you having a reaction (difficulty breathing--STAT)?   4. What is your medication issue? Kristin from [facility] called and requesting a new prescription for protonix with the new dosage instruction on it. Per phone note 04/02/19 40 mg in the am and 20 mg in pm

## 2019-04-08 NOTE — Telephone Encounter (Signed)
New prescription sent in per phone note.

## 2019-04-10 ENCOUNTER — Telehealth: Payer: Self-pay | Admitting: Family Medicine

## 2019-04-10 NOTE — Telephone Encounter (Signed)
Patient aware she did not have a urine culture done last week that she had a drug screen.  Verbalized understanding.

## 2019-04-28 ENCOUNTER — Inpatient Hospital Stay: Payer: Medicare Other | Attending: Hematology & Oncology

## 2019-04-28 ENCOUNTER — Other Ambulatory Visit: Payer: Self-pay

## 2019-04-28 ENCOUNTER — Encounter: Payer: Self-pay | Admitting: Hematology & Oncology

## 2019-04-28 ENCOUNTER — Inpatient Hospital Stay (HOSPITAL_BASED_OUTPATIENT_CLINIC_OR_DEPARTMENT_OTHER): Payer: Medicare Other | Admitting: Hematology & Oncology

## 2019-04-28 VITALS — BP 108/70 | HR 74 | Temp 97.3°F | Resp 16 | Wt 151.0 lb

## 2019-04-28 DIAGNOSIS — D473 Essential (hemorrhagic) thrombocythemia: Secondary | ICD-10-CM | POA: Insufficient documentation

## 2019-04-28 DIAGNOSIS — D75839 Thrombocytosis, unspecified: Secondary | ICD-10-CM

## 2019-04-28 DIAGNOSIS — D509 Iron deficiency anemia, unspecified: Secondary | ICD-10-CM

## 2019-04-28 DIAGNOSIS — Z859 Personal history of malignant neoplasm, unspecified: Secondary | ICD-10-CM | POA: Insufficient documentation

## 2019-04-28 DIAGNOSIS — C801 Malignant (primary) neoplasm, unspecified: Secondary | ICD-10-CM | POA: Diagnosis not present

## 2019-04-28 LAB — CBC WITH DIFFERENTIAL (CANCER CENTER ONLY)
Abs Immature Granulocytes: 0.01 10*3/uL (ref 0.00–0.07)
Basophils Absolute: 0.1 10*3/uL (ref 0.0–0.1)
Basophils Relative: 1 %
Eosinophils Absolute: 0.4 10*3/uL (ref 0.0–0.5)
Eosinophils Relative: 6 %
HCT: 38.2 % (ref 36.0–46.0)
Hemoglobin: 12 g/dL (ref 12.0–15.0)
Immature Granulocytes: 0 %
Lymphocytes Relative: 43 %
Lymphs Abs: 2.6 10*3/uL (ref 0.7–4.0)
MCH: 31 pg (ref 26.0–34.0)
MCHC: 31.4 g/dL (ref 30.0–36.0)
MCV: 98.7 fL (ref 80.0–100.0)
Monocytes Absolute: 0.6 10*3/uL (ref 0.1–1.0)
Monocytes Relative: 9 %
Neutro Abs: 2.5 10*3/uL (ref 1.7–7.7)
Neutrophils Relative %: 41 %
Platelet Count: 411 10*3/uL — ABNORMAL HIGH (ref 150–400)
RBC: 3.87 MIL/uL (ref 3.87–5.11)
RDW: 13.2 % (ref 11.5–15.5)
WBC Count: 6 10*3/uL (ref 4.0–10.5)
nRBC: 0 % (ref 0.0–0.2)

## 2019-04-28 LAB — CMP (CANCER CENTER ONLY)
ALT: 8 U/L (ref 0–44)
AST: 11 U/L — ABNORMAL LOW (ref 15–41)
Albumin: 4.4 g/dL (ref 3.5–5.0)
Alkaline Phosphatase: 95 U/L (ref 38–126)
Anion gap: 6 (ref 5–15)
BUN: 16 mg/dL (ref 8–23)
CO2: 32 mmol/L (ref 22–32)
Calcium: 10.1 mg/dL (ref 8.9–10.3)
Chloride: 103 mmol/L (ref 98–111)
Creatinine: 0.89 mg/dL (ref 0.44–1.00)
GFR, Est AFR Am: >60 mL/min (ref >60)
GFR, Estimated: >60 mL/min (ref >60)
Glucose, Bld: 94 mg/dL (ref 70–99)
Potassium: 5.2 mmol/L — ABNORMAL HIGH (ref 3.5–5.1)
Sodium: 141 mmol/L (ref 135–145)
Total Bilirubin: 0.3 mg/dL (ref 0.3–1.2)
Total Protein: 7.4 g/dL (ref 6.5–8.1)

## 2019-04-28 NOTE — Progress Notes (Signed)
Hematology and Oncology Follow Up Visit  Amy Yasuda Evitt [id number] 02-Jan-1941 79 y.o. 04/28/2019   Principle Diagnosis:  Transient thrombocytosis History transverse myelitis History of adenosquamous carcinoma of unknown primary  Current Therapy:   Observation   Interim History:  Amy Black is here today for follow-up.  We last saw her back in September.  Since then, she has been doing quite well.  She really has had no complaints.  She has not yet gotten the coronavirus vaccine.  There is been no problems with nausea or vomiting.  She has had no headache.  She has had no pain in her hands or feet.  She has had no redness in her hands or feet.  There is been no change in bowel or bladder habits.  She has had no fever.  She has had no cough or shortness of breath.  There is been no rashes.  She has had no leg swelling.  As far she can remember, there is no new medications.  Overall, her performance status is ECOG 1.  Medications:  Allergies as of 04/28/2019      Reactions   Iodine Itching   Amlodipine Other (See Comments)   headaches   Lexapro [escitalopram Oxalate] Other (See Comments)   Drunk, High feeling   Cymbalta [duloxetine Hcl] Other (See Comments)   sleepiness   Gabapentin Other (See Comments)   sleepiness   Ivp Dye [iodinated Diagnostic Agents] Itching   Zanaflex [tizanidine Hcl] Other (See Comments)   Very drunk feeling next day      Medication List       Accurate as of April 28, 2019  3:10 PM. If you have any questions, ask your nurse or doctor.        ALPRAZolam 0.5 MG tablet Commonly known as: XANAX Take 1 tablet (0.5 mg total) by mouth 3 (three) times daily.   aspirin EC 81 MG tablet Take 81 mg by mouth daily.   Calcium Citrate-Vitamin D 315-250 MG-UNIT Tabs Commonly known as: Calcium Citrate + D3 Take 2 tablets by mouth daily.   cyclobenzaprine 5 MG tablet Commonly known as: FLEXERIL TAKE ONE TABLET BY MOUTH TWICE DAILY FOR 14 DAYS     Ferrex 150 150 MG capsule Generic drug: iron polysaccharides TAKE ONE CAPSULE BY MOUTH ONCE DAILY   fluticasone 50 MCG/ACT nasal spray Commonly known as: FLONASE Place 2 sprays into both nostrils daily.   hydrOXYzine 25 MG tablet Commonly known as: ATARAX/VISTARIL Take 25-50 mg by mouth at bedtime as needed.   isosorbide mononitrate 120 MG 24 hr tablet Commonly known as: IMDUR Take 1 tablet (120 mg total) by mouth daily.   loperamide 2 MG capsule Commonly known as: IMODIUM 1 capsule by mouth four times daily PRN   nabumetone 500 MG tablet Commonly known as: RELAFEN TAKE ONE TABLET BY MOUTH TWICE DAILY FOR MUSCLE & JOINT PAIN   nitroGLYCERIN 0.4 MG SL tablet Commonly known as: Nitrostat DISSOLVE ONE TABLET UNDER TONGUE EVERY 5 MINUTES UP TO 3 DOSES AS NEEDED FOR CHEST PAIN   ondansetron 4 MG disintegrating tablet Commonly known as: Zofran ODT Take 1 tablet (4 mg total) by mouth every 8 (eight) hours as needed for nausea or vomiting.   pantoprazole 20 MG tablet Commonly known as: Protonix Take 2 tablets (40 mg total) by mouth every morning AND 1 tablet (20 mg total) every evening.   traMADol 50 MG tablet Commonly known as: ULTRAM TAKE ONE TABLET BY MOUTH EVERY TWELVE HOURS AS NEEDED.  triamterene-hydrochlorothiazide 37.5-25 MG capsule Commonly known as: DYAZIDE TAKE ONE CAPSULE BY MOUTH 3 TIMES PER WEEK   verapamil 120 MG CR tablet Commonly known as: CALAN-SR TAKE ONE TABLET BY MOUTH AT BEDTIME       Allergies:  Allergies  Allergen Reactions  . Iodine Itching  . Amlodipine Other (See Comments)    headaches  . Lexapro [Escitalopram Oxalate] Other (See Comments)    Drunk, High feeling  . Cymbalta [Duloxetine Hcl] Other (See Comments)    sleepiness  . Gabapentin Other (See Comments)    sleepiness  . Ivp Dye [Iodinated Diagnostic Agents] Itching  . Zanaflex [Tizanidine Hcl] Other (See Comments)    Very drunk feeling next day    Past Medical History,  Surgical history, Social history, and Family History were reviewed and updated.  Review of Systems: Review of Systems  Constitutional: Negative.   HENT: Negative.   Eyes: Negative.   Respiratory: Negative.   Cardiovascular: Negative.   Gastrointestinal: Negative.   Genitourinary: Negative.   Musculoskeletal: Negative.   Skin: Negative.   Neurological: Negative.   Endo/Heme/Allergies: Negative.   Psychiatric/Behavioral: Negative.       Physical Exam:  weight is 151 lb (68.5 kg). Her temporal temperature is 97.3 F (36.3 C) (abnormal). Her blood pressure is 108/70 and her pulse is 74. Her respiration is 16 and oxygen saturation is 100%.   Wt Readings from Last 3 Encounters:  04/28/19 151 lb (68.5 kg)  04/03/19 152 lb 6.4 oz (69.1 kg)  01/10/19 152 lb (68.9 kg)    Physical Exam Vitals reviewed.  HENT:     Head: Normocephalic and atraumatic.  Eyes:     Pupils: Pupils are equal, round, and reactive to light.  Cardiovascular:     Rate and Rhythm: Normal rate and regular rhythm.     Heart sounds: Normal heart sounds.  Pulmonary:     Effort: Pulmonary effort is normal.     Breath sounds: Normal breath sounds.  Abdominal:     General: Bowel sounds are normal.     Palpations: Abdomen is soft.  Musculoskeletal:        General: No tenderness or deformity. Normal range of motion.     Cervical back: Normal range of motion.  Lymphadenopathy:     Cervical: No cervical adenopathy.  Skin:    General: Skin is warm and dry.     Findings: No erythema or rash.  Neurological:     Mental Status: She is alert and oriented to person, place, and time.  Psychiatric:        Behavior: Behavior normal.        Thought Content: Thought content normal.        Judgment: Judgment normal.      Lab Results  Component Value Date   WBC 6.0 04/28/2019   HGB 12.0 04/28/2019   HCT 38.2 04/28/2019   MCV 98.7 04/28/2019   PLT 411 (H) 04/28/2019   Lab Results  Component Value Date    FERRITIN 109 10/28/2018   IRON 92 10/28/2018   TIBC 236 10/28/2018   UIBC 144 10/28/2018   IRONPCTSAT 39 10/28/2018   Lab Results  Component Value Date   RETICCTPCT 1.6 10/03/2017   RBC 3.87 04/28/2019   No results found for: KPAFRELGTCHN, LAMBDASER, KAPLAMBRATIO No results found for: IGGSERUM, IGA, IGMSERUM No results found for: TOTALPROTELP, ALBUMINELP, A1GS, A2GS, BETS, BETA2SER, GAMS, MSPIKE, SPEI   Chemistry      Component Value Date/Time   NA  139 01/27/2019 1053   NA 138 01/28/2016 1303   K 4.4 01/27/2019 1053   K 4.2 07/28/2016 1243   K 4.2 01/28/2016 1303   CL 99 01/27/2019 1053   CL 98 07/28/2016 1243   CL 101 07/20/2014 1131   CO2 25 01/27/2019 1053   CO2 29 07/28/2016 1243   CO2 30 (H) 01/28/2016 1303   BUN 15 01/27/2019 1053   BUN 10.0 01/28/2016 1303   CREATININE 0.81 01/27/2019 1053   CREATININE 0.86 10/28/2018 1102   CREATININE 0.94 07/28/2016 1243   CREATININE 0.8 01/28/2016 1303      Component Value Date/Time   CALCIUM 10.0 01/27/2019 1053   CALCIUM 9.9 07/28/2016 1243   CALCIUM 10.0 01/28/2016 1303   ALKPHOS 96 10/28/2018 1102   ALKPHOS 105 07/28/2016 1243   ALKPHOS 123 01/28/2016 1303   AST 13 (L) 10/28/2018 1102   AST 13 01/28/2016 1303   ALT 10 10/28/2018 1102   ALT 12 01/28/2016 1303   BILITOT 0.4 10/28/2018 1102   BILITOT 0.51 01/28/2016 1303       Impression and Plan: Amy Black is a very pleasant 79yo African American female with history of transient thrombocytosis and also history of adenosquamous carcinoma with unknown primary.   So far, there is no evidence of recurrence of the adenosquamous carcinoma.  This was in her leg.  She had surgery and radiation for this.  This had to be about 15 years ago.  Her platelet count is basically the same.  I really do not think that this is going to be much of a problem for her.  We will still follow her up every 6 months.  I think this is still reasonable for her.  Hopefully, when we see her  back, she would have had her coronavirus vaccines.Volanda Napoleon, MD 3/22/20213:10 PM

## 2019-04-29 LAB — IRON AND TIBC
Iron: 52 ug/dL (ref 41–142)
Saturation Ratios: 20 % — ABNORMAL LOW (ref 21–57)
TIBC: 259 ug/dL (ref 236–444)
UIBC: 207 ug/dL (ref 120–384)

## 2019-04-29 LAB — FERRITIN: Ferritin: 81 ng/mL (ref 11–307)

## 2019-04-30 ENCOUNTER — Encounter: Payer: Self-pay | Admitting: Family Medicine

## 2019-04-30 ENCOUNTER — Ambulatory Visit (INDEPENDENT_AMBULATORY_CARE_PROVIDER_SITE_OTHER): Payer: Medicare Other | Admitting: Family Medicine

## 2019-04-30 ENCOUNTER — Telehealth: Payer: Self-pay | Admitting: Family Medicine

## 2019-04-30 DIAGNOSIS — M545 Low back pain, unspecified: Secondary | ICD-10-CM

## 2019-04-30 DIAGNOSIS — N3090 Cystitis, unspecified without hematuria: Secondary | ICD-10-CM

## 2019-04-30 LAB — URINALYSIS, COMPLETE
Bilirubin, UA: NEGATIVE
Glucose, UA: NEGATIVE
Ketones, UA: NEGATIVE
Nitrite, UA: NEGATIVE
Protein,UA: NEGATIVE
RBC, UA: NEGATIVE
Specific Gravity, UA: 1.015 (ref 1.005–1.030)
Urobilinogen, Ur: 0.2 mg/dL (ref 0.2–1.0)
pH, UA: 7 (ref 5.0–7.5)

## 2019-04-30 LAB — MICROSCOPIC EXAMINATION: RBC, Urine: NONE SEEN /hpf (ref 0–2)

## 2019-04-30 NOTE — Telephone Encounter (Signed)
There are no medications to send. She needs to provide a urine sample.

## 2019-04-30 NOTE — Telephone Encounter (Signed)
Patient aware states husband brought in

## 2019-04-30 NOTE — Progress Notes (Signed)
Virtual Visit via telephone Note Due to COVID-19 pandemic this visit was conducted virtually. This visit type was conducted due to national recommendations for restrictions regarding the COVID-19 Pandemic (e.g. social distancing, sheltering in place) in an effort to limit this patient's exposure and mitigate transmission in our community. All issues noted in this document were discussed and addressed.  A physical exam was not performed with this format.   I connected with Amy Black on 04/30/2019 at 1305 by telephone and verified that I am speaking with the correct person using two identifiers. Amy Black is currently located at home and family is currently with them during visit. The provider, Monia Pouch, FNP is located in their office at time of visit.  I discussed the limitations, risks, security and privacy concerns of performing an evaluation and management service by telephone and the availability of in person appointments. I also discussed with the patient that there may be a patient responsible charge related to this service. The patient expressed understanding and agreed to proceed.  Subjective:  Patient ID: Amy Black, female    DOB: 1941-01-25, 79 y.o.   MRN: [id number]  Chief Complaint:  Back Pain   HPI: Amy Black is a 79 y.o. female presenting on 04/30/2019 for Back Pain   Pt has a history of chronic lower back pain. Pt states she continues to have this pain. No urinary symptoms but feels this may be related to an UTI. Pt denies fever, chills, dysuria, urgency, or frequency. No other associated symptoms. No radiculopathy. No cauda equina concerns. No new injuries.     Relevant past medical, surgical, family, and social history reviewed and updated as indicated.  Allergies and medications reviewed and updated.   Past Medical History:  Diagnosis Date  . Adenocarcinoma (HCC)    LEFT LEG  . Adenocarcinoma of unknown primary ([place]) 02/06/2011  .  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 Allergy   . Anxiety   . CAD (coronary artery disease)    a. minimal CAD by cath in 2016 with presumed spasm (20% mLAD, 20% D2).  . Cataract    bilateral cataracts removed  . Coronary artery spasm ([place])   . Diverticulosis of colon (without mention of hemorrhage)   . GERD (gastroesophageal reflux disease)   . hepatic cyst   . Hip fracture, right ([place])   . History of cardiac catheterization   . History of nuclear stress test 04/2011   lexiscan; normal study, no significant ischemia, low risk; 2015 - normal  . HTN (hypertension)    PT. DENIES  . Hyperlipidemia   . Insomnia   . Irritable bowel syndrome   . Transverse myelitis ([place])   . Vitamin D deficiency     Past Surgical History:  Procedure Laterality Date  . ABDOMINAL HYSTERECTOMY Bilateral   . CARDIAC CATHETERIZATION     coronary spasm  . CARDIAC CATHETERIZATION N/A 08/28/2014   Procedure: Left Heart Cath and Coronary Angiography;  Surgeon: Jettie Booze, MD;  Location: [facility];  Service: Cardiovascular;  Laterality: N/A;  . COLONOSCOPY    . DIAGNOSTIC LAPAROSCOPY    . LYSIS OF ADHESION    . PELVIC LAPAROSCOPY  1992   RSO, AND LSO ON 2007  . RESECTION SOFT TISSUE TUMOR LEG / ANKLE RADICAL  2004   leg lesion resection & radiation  . SALPINGOOPHORECTOMY Left   . TOTAL HIP ARTHROPLASTY Right 10/04/2015  Procedure: TOTAL HIP ARTHROPLASTY ANTERIOR APPROACH;  Surgeon: Rod Can, MD;  Location: [facility];  Service: Orthopedics;  Laterality: Right;  . TRANSTHORACIC ECHOCARDIOGRAM  07/2012   LV cavity size mildly reduced, normal wall motion; MV with calcified annulus and mild MR; LA mildly dilated; atrial septum with increased thickness - lipomatous hypertrophy; RV systolic pressure increased (borderline pulm HTN)  . UPPER GASTROINTESTINAL ENDOSCOPY      Social History   Socioeconomic History  . Marital status: Married    Spouse name: Augustin Coupe  .  Number of children: 2  . Years of education: 47  . Highest education level: High school graduate  Occupational History  . Occupation: Disabilty  . Occupation: DISABILITY    Employer: UNEMPLOYED  Tobacco Use  . Smoking status: Former Smoker    Packs/day: 0.50    Years: 4.00    Pack years: 2.00    Types: Cigarettes    Start date: 12/07/1968    Quit date: 11/20/1972    Years since quitting: 46.4  . Smokeless tobacco: Never Used  . Tobacco comment: quit 40 years ago  Substance and Sexual Activity  . Alcohol use: No    Alcohol/week: 0.0 standard drinks  . Drug use: No  . Sexual activity: Not Currently    Birth control/protection: Surgical  Other Topics Concern  . Not on file  Social History Narrative  . Not on file   Social Determinants of Health   Financial Resource Strain: Medium Risk  . Difficulty of Paying Living Expenses: Somewhat hard  Food Insecurity: No Food Insecurity  . Worried About Charity fundraiser in the Last Year: Never true  . Ran Out of Food in the Last Year: Never true  Transportation Needs: No Transportation Needs  . Lack of Transportation (Medical): No  . Lack of Transportation (Non-Medical): No  Physical Activity: Inactive  . Days of Exercise per Week: 0 days  . Minutes of Exercise per Session: 0 min  Stress: No Stress Concern Present  . Feeling of Stress : Not at all  Social Connections: Not Isolated  . Frequency of Communication with Friends and Family: More than three times a week  . Frequency of Social Gatherings with Friends and Family: More than three times a week  . Attends Religious Services: More than 4 times per year  . Active Member of Clubs or Organizations: Yes  . Attends Archivist Meetings: More than 4 times per year  . Marital Status: Married  Human resources officer Violence: Not At Risk  . Fear of Current or Ex-Partner: No  . Emotionally Abused: No  . Physically Abused: No  . Sexually Abused: No    Outpatient Encounter  Medications as of 04/30/2019  Medication Sig  . ALPRAZolam (XANAX) 0.5 MG tablet Take 1 tablet (0.5 mg total) by mouth 3 (three) times daily.  Marland Kitchen aspirin EC 81 MG tablet Take 81 mg by mouth daily.  . Calcium Citrate-Vitamin D (CALCIUM CITRATE + D3) 315-250 MG-UNIT TABS Take 2 tablets by mouth daily.  . cyclobenzaprine (FLEXERIL) 5 MG tablet TAKE ONE TABLET BY MOUTH TWICE DAILY FOR 14 DAYS  . FERREX 150 150 MG capsule TAKE ONE CAPSULE BY MOUTH ONCE DAILY  . fluticasone (FLONASE) 50 MCG/ACT nasal spray Place 2 sprays into both nostrils daily.  . hydrOXYzine (ATARAX/VISTARIL) 25 MG tablet Take 25-50 mg by mouth at bedtime as needed.  . isosorbide mononitrate (IMDUR) 120 MG 24 hr tablet Take 1 tablet (120 mg total) by mouth daily.  Marland Kitchen  loperamide (IMODIUM) 2 MG capsule 1 capsule by mouth four times daily PRN  . nabumetone (RELAFEN) 500 MG tablet TAKE ONE TABLET BY MOUTH TWICE DAILY FOR MUSCLE & JOINT PAIN  . nitroGLYCERIN (NITROSTAT) 0.4 MG SL tablet DISSOLVE ONE TABLET UNDER TONGUE EVERY 5 MINUTES UP TO 3 DOSES AS NEEDED FOR CHEST PAIN  . ondansetron (ZOFRAN ODT) 4 MG disintegrating tablet Take 1 tablet (4 mg total) by mouth every 8 (eight) hours as needed for nausea or vomiting. (Patient not taking: Reported on 04/03/2019)  . pantoprazole (PROTONIX) 20 MG tablet Take 2 tablets (40 mg total) by mouth every morning AND 1 tablet (20 mg total) every evening.  . traMADol (ULTRAM) 50 MG tablet TAKE ONE TABLET BY MOUTH EVERY TWELVE HOURS AS NEEDED.  Marland Kitchen triamterene-hydrochlorothiazide (DYAZIDE) 37.5-25 MG capsule TAKE ONE CAPSULE BY MOUTH 3 TIMES PER WEEK  . verapamil (CALAN-SR) 120 MG CR tablet TAKE ONE TABLET BY MOUTH AT BEDTIME   No facility-administered encounter medications on file as of 04/30/2019.    Allergies  Allergen Reactions  . Iodine Itching  . Amlodipine Other (See Comments)    headaches  . Lexapro [Escitalopram Oxalate] Other (See Comments)    Drunk, High feeling  . Cymbalta [Duloxetine  Hcl] Other (See Comments)    sleepiness  . Gabapentin Other (See Comments)    sleepiness  . Ivp Dye [Iodinated Diagnostic Agents] Itching  . Zanaflex [Tizanidine Hcl] Other (See Comments)    Very drunk feeling next day    Review of Systems  Constitutional: Negative for activity change, appetite change, chills, fatigue and fever.  HENT: Negative.   Eyes: Negative.   Respiratory: Negative for cough, chest tightness and shortness of breath.   Cardiovascular: Negative for chest pain, palpitations and leg swelling.  Gastrointestinal: Negative for abdominal pain, blood in stool, constipation, diarrhea, nausea and vomiting.  Endocrine: Negative.   Genitourinary: Negative for decreased urine volume, difficulty urinating, dysuria, flank pain, frequency, hematuria and urgency.  Musculoskeletal: Positive for arthralgias, back pain and gait problem. Negative for myalgias.  Skin: Negative.   Allergic/Immunologic: Negative.   Neurological: Negative for dizziness, weakness and headaches.  Hematological: Negative.   Psychiatric/Behavioral: Negative for confusion, hallucinations, sleep disturbance and suicidal ideas.  All other systems reviewed and are negative.        Observations/Objective: No vital signs or physical exam, this was a telephone or virtual health encounter.  Pt alert and oriented, answers all questions appropriately, and able to speak in full sentences.    Assessment and Plan: Sofie was seen today for back pain.  Diagnoses and all orders for this visit:  Acute bilateral low back pain without sciatica Pt with chronic back pain. Concerned about UTI. Pt will provide urinalysis. Treatment pending results.  -     Urine Culture -     Urinalysis, Complete     Follow Up Instructions: Return if symptoms worsen or fail to improve.    I discussed the assessment and treatment plan with the patient. The patient was provided an opportunity to ask questions and all were  answered. The patient agreed with the plan and demonstrated an understanding of the instructions.   The patient was advised to call back or seek an in-person evaluation if the symptoms worsen or if the condition fails to improve as anticipated.  The above assessment and management plan was discussed with the patient. The patient verbalized understanding of and has agreed to the management plan. Patient is aware to call the clinic  if they develop any new symptoms or if symptoms persist or worsen. Patient is aware when to return to the clinic for a follow-up visit. Patient educated on when it is appropriate to go to the emergency department.    I provided 15 minutes of non-face-to-face time during this encounter. The call started at 1305. The call ended at 1320. The other time was used for coordination of care.    Monia Pouch, FNP-C [facility] [address] [place],  [address] [phone] 04/30/2019

## 2019-05-01 ENCOUNTER — Telehealth: Payer: Self-pay | Admitting: Family Medicine

## 2019-05-01 NOTE — Chronic Care Management (AMB) (Signed)
°  Chronic Care Management   Outreach Note  05/01/2019 Name: Crystale Pfohl Cdebaca MRN: [id number] DOB: 25-Aug-1940  Lorenz Coaster Frizell is a 79 y.o. year old female who is a primary care patient of Stacks, Cletus Gash, MD. I reached out to [employer] by phone today in response to a referral sent by Ms. Lorenz Coaster Zelman's health plan.     An unsuccessful telephone outreach was attempted today. The patient was referred to the case management team for assistance with care management and care coordination.   Follow Up Plan: A HIPPA compliant phone message was left for the patient providing contact information and requesting a return call.  The care management team will reach out to the patient again over the next 7 days.  If patient returns call to provider office, please advise to call [facility]  at [place], [place], [place], [place] [address] Direct Dial: [phone] [email or web address] Website: [email or web address]

## 2019-05-02 LAB — URINE CULTURE

## 2019-05-02 MED ORDER — AMOXICILLIN-POT CLAVULANATE 875-125 MG PO TABS: 1.0000 | ORAL_TABLET | Freq: Two times a day (BID) | ORAL | 0 refills | Status: AC

## 2019-05-02 NOTE — Addendum Note (Signed)
Addended by: Baruch Gouty on: 05/02/2019 09:40 AM   Modules accepted: Orders

## 2019-05-05 NOTE — Chronic Care Management (AMB) (Signed)
  Chronic Care Management   Note  05/05/2019 Name: Navaya Wiatrek Rosales MRN: [id number] DOB: [birth year]  Lorenz Coaster Krinke is a 79 y.o. year old female who is a primary care patient of Stacks, Cletus Gash, MD. I reached out to [employer] by phone today in response to a referral sent by Ms. Lorenz Coaster Mersman's health plan.     Ms. Trahan was given information about Chronic Care Management services today including:  1. CCM service includes personalized support from designated clinical staff supervised by her physician, including individualized plan of care and coordination with other care providers 2. 24/7 contact phone numbers for assistance for urgent and routine care needs. 3. Service will only be billed when office clinical staff spend 20 minutes or more in a month to coordinate care. 4. Only one practitioner may furnish and bill the service in a calendar month. 5. The patient may stop CCM services at any time (effective at the end of the month) by phone call to the office staff. 6. The patient will be responsible for cost sharing (co-pay) of up to 20% of the service fee (after annual deductible is met).  Patient did not agree to enrollment in care management services and does not wish to consider at this time.  Follow up plan: The patient has been provided with contact information for the care management team and has been advised to call with any health related questions or concerns.   Noreene Larsson, [place], [place], [place] [address] Direct Dial: [phone] [email or web address] Website: [email or web address]

## 2019-05-07 ENCOUNTER — Other Ambulatory Visit: Payer: Self-pay | Admitting: Family

## 2019-05-07 DIAGNOSIS — D509 Iron deficiency anemia, unspecified: Secondary | ICD-10-CM | POA: Insufficient documentation

## 2019-05-07 DIAGNOSIS — D508 Other iron deficiency anemias: Secondary | ICD-10-CM

## 2019-05-08 ENCOUNTER — Other Ambulatory Visit: Payer: Medicare Other

## 2019-05-08 ENCOUNTER — Other Ambulatory Visit: Payer: Self-pay | Admitting: *Deleted

## 2019-05-08 ENCOUNTER — Inpatient Hospital Stay: Payer: Medicare Other | Attending: Hematology & Oncology

## 2019-05-08 ENCOUNTER — Other Ambulatory Visit: Payer: Self-pay

## 2019-05-08 ENCOUNTER — Telehealth: Payer: Self-pay | Admitting: Family Medicine

## 2019-05-08 VITALS — BP 148/62 | HR 64

## 2019-05-08 DIAGNOSIS — D509 Iron deficiency anemia, unspecified: Secondary | ICD-10-CM | POA: Insufficient documentation

## 2019-05-08 DIAGNOSIS — R399 Unspecified symptoms and signs involving the genitourinary system: Secondary | ICD-10-CM

## 2019-05-08 DIAGNOSIS — D508 Other iron deficiency anemias: Secondary | ICD-10-CM

## 2019-05-08 LAB — URINALYSIS, COMPLETE
Bilirubin, UA: NEGATIVE
Glucose, UA: NEGATIVE
Ketones, UA: NEGATIVE
Leukocytes,UA: NEGATIVE
Nitrite, UA: NEGATIVE
Protein,UA: NEGATIVE
RBC, UA: NEGATIVE
Specific Gravity, UA: 1.025 (ref 1.005–1.030)
Urobilinogen, Ur: 0.2 mg/dL (ref 0.2–1.0)
pH, UA: 7 (ref 5.0–7.5)

## 2019-05-08 LAB — MICROSCOPIC EXAMINATION
Bacteria, UA: NONE SEEN
RBC, Urine: NONE SEEN /hpf (ref 0–2)
Renal Epithel, UA: NONE SEEN /hpf

## 2019-05-08 MED ORDER — SODIUM CHLORIDE 0.9 % IV SOLN
200.0000 mg | Freq: Once | INTRAVENOUS | Status: AC
  Administered 2019-05-08: 200 mg via INTRAVENOUS
  Filled 2019-05-08: qty 200

## 2019-05-08 MED ORDER — SODIUM CHLORIDE 0.9 % IV SOLN
Freq: Once | INTRAVENOUS | Status: AC
  Filled 2019-05-08: qty 250

## 2019-05-08 NOTE — Telephone Encounter (Signed)
She will need to repeat a urinalysis and culture

## 2019-05-08 NOTE — Telephone Encounter (Signed)
Pt called stating that she had a televisit with Dr Thayer Ohm on 04/30/19 for UTI. Says she has taken all of the antibiotics that was sent in for her, but is still having same issues, and not much better. Wants to know if she needs to be seen or if something else can be called in for her.

## 2019-05-08 NOTE — Patient Instructions (Signed)

## 2019-05-08 NOTE — Telephone Encounter (Signed)
Aware, she will need to come do another specimen .

## 2019-05-09 LAB — URINE CULTURE

## 2019-05-13 DIAGNOSIS — Z1231 Encounter for screening mammogram for malignant neoplasm of breast: Secondary | ICD-10-CM | POA: Diagnosis not present

## 2019-05-14 DIAGNOSIS — N200 Calculus of kidney: Secondary | ICD-10-CM | POA: Diagnosis not present

## 2019-05-14 DIAGNOSIS — N1330 Unspecified hydronephrosis: Secondary | ICD-10-CM | POA: Diagnosis not present

## 2019-05-16 ENCOUNTER — Other Ambulatory Visit: Payer: Self-pay | Admitting: Adult Health

## 2019-05-22 DIAGNOSIS — H905 Unspecified sensorineural hearing loss: Secondary | ICD-10-CM | POA: Diagnosis not present

## 2019-05-28 DIAGNOSIS — N390 Urinary tract infection, site not specified: Secondary | ICD-10-CM | POA: Diagnosis not present

## 2019-05-28 DIAGNOSIS — B951 Streptococcus, group B, as the cause of diseases classified elsewhere: Secondary | ICD-10-CM | POA: Diagnosis not present

## 2019-05-28 DIAGNOSIS — R1084 Generalized abdominal pain: Secondary | ICD-10-CM | POA: Diagnosis not present

## 2019-06-05 ENCOUNTER — Other Ambulatory Visit: Payer: Self-pay | Admitting: Family Medicine

## 2019-06-24 DIAGNOSIS — Z961 Presence of intraocular lens: Secondary | ICD-10-CM | POA: Diagnosis not present

## 2019-06-24 DIAGNOSIS — H5213 Myopia, bilateral: Secondary | ICD-10-CM | POA: Diagnosis not present

## 2019-06-24 DIAGNOSIS — H35372 Puckering of macula, left eye: Secondary | ICD-10-CM | POA: Diagnosis not present

## 2019-06-24 DIAGNOSIS — H52203 Unspecified astigmatism, bilateral: Secondary | ICD-10-CM | POA: Diagnosis not present

## 2019-07-02 ENCOUNTER — Ambulatory Visit: Payer: Medicare Other | Admitting: Family Medicine

## 2019-07-03 ENCOUNTER — Other Ambulatory Visit: Payer: Self-pay

## 2019-07-03 ENCOUNTER — Encounter: Payer: Self-pay | Admitting: Family Medicine

## 2019-07-03 ENCOUNTER — Ambulatory Visit (INDEPENDENT_AMBULATORY_CARE_PROVIDER_SITE_OTHER): Payer: Medicare Other | Admitting: Family Medicine

## 2019-07-03 VITALS — BP 135/79 | HR 72 | Temp 97.1°F | Resp 20 | Ht 63.0 in | Wt 153.0 lb

## 2019-07-03 DIAGNOSIS — I10 Essential (primary) hypertension: Secondary | ICD-10-CM

## 2019-07-03 DIAGNOSIS — Z79899 Other long term (current) drug therapy: Secondary | ICD-10-CM

## 2019-07-03 DIAGNOSIS — E785 Hyperlipidemia, unspecified: Secondary | ICD-10-CM

## 2019-07-03 DIAGNOSIS — E1169 Type 2 diabetes mellitus with other specified complication: Secondary | ICD-10-CM

## 2019-07-03 DIAGNOSIS — F419 Anxiety disorder, unspecified: Secondary | ICD-10-CM | POA: Diagnosis not present

## 2019-07-03 DIAGNOSIS — M47812 Spondylosis without myelopathy or radiculopathy, cervical region: Secondary | ICD-10-CM

## 2019-07-03 NOTE — Progress Notes (Signed)
Subjective:  Patient ID: Amy Black, female    DOB: 08/21/1940  Age: 79 y.o. MRN: [id number]  CC: Medical Management of Chronic Issues   HPI Amy Black presents for recheck for her use of xanax for anxiety. She also takes tramadol for her neck pain. She rates her pain as 8/10.  It is primarily at the posterior and left side of her neck.  She says the Flexeril is not helping.  She would like to try tizanidine.  Patient says she feels bad without her Xanax.  We discussed taper she refused this option.  She understands the risk involved with continuing to take these medications.  GAD 7 : Generalized Anxiety Score 07/03/2019 01/25/2016  Nervous, Anxious, on Edge 0 0  Control/stop worrying 0 0  Worry too much - different things 0 0  Trouble relaxing 0 0  Restless 0 0  Easily annoyed or irritable 0 0  Afraid - awful might happen 0 0  Total GAD 7 Score 0 0  Anxiety Difficulty - Not difficult at all      Depression screen [facility] 2/9 07/03/2019 04/03/2019 09/25/2018  Decreased Interest 0 0 0  Down, Depressed, Hopeless 0 0 0  PHQ - 2 Score 0 0 0  Altered sleeping - - -  Tired, decreased energy - - -  Change in appetite - - -  Feeling bad or failure about yourself  - - -  Trouble concentrating - - -  Moving slowly or fidgety/restless - - -  Suicidal thoughts - - -  PHQ-9 Score - - -  Some recent data might be hidden    History Amy Black has a past medical history of Adenocarcinoma ([place]), Adenocarcinoma of unknown primary ([place]) (02/06/2011), Adenosquamous carcinoma, Allergy, Anxiety, CAD (coronary artery disease), Cataract, Coronary artery spasm ([place]), Diverticulosis of colon (without mention of hemorrhage), GERD (gastroesophageal reflux disease), hepatic cyst, Hip fracture, right ([place]), History of cardiac catheterization, History of nuclear stress test (04/2011), HTN (hypertension), Hyperlipidemia, Insomnia, Irritable bowel syndrome, Transverse myelitis ([place]), and Vitamin D  deficiency.   She has a past surgical history that includes Resection soft tissue tumor leg / ankle radical (2004); Diagnostic laparoscopy; Lysis of adhesion; Salpingoophorectomy (Left); Pelvic laparoscopy (1992); Cardiac catheterization; transthoracic echocardiogram (07/2012); Cardiac catheterization (N/A, 08/28/2014); Colonoscopy; Upper gastrointestinal endoscopy; Total hip arthroplasty (Right, 10/04/2015); and Abdominal hysterectomy (Bilateral).   Her family history includes Bladder Cancer in her mother; Coronary artery disease in an other family member; Depression in her sister; Diabetes in her brother and another family member; Heart disease in her father and mother; Hyperlipidemia in her sister; Hypertension in her brother and mother; Seizures in her brother; Stroke in her father.She reports that she quit smoking about 46 years ago. Her smoking use included cigarettes. She started smoking about 50 years ago. She has a 2.00 pack-year smoking history. She has never used smokeless tobacco. She reports that she does not drink alcohol or use drugs.    ROS Review of Systems  Constitutional: Negative.   HENT: Negative.   Eyes: Negative for visual disturbance.  Respiratory: Negative for shortness of breath.   Cardiovascular: Negative for chest pain.  Gastrointestinal: Negative for abdominal pain.  Musculoskeletal: Negative for arthralgias.    Objective:  BP 135/79   Pulse 72   Temp (!) 97.1 F (36.2 C) (Temporal)   Resp 20   Ht 5\' 3"  (1.6 m)   Wt 153 lb (69.4 kg)   SpO2 97%   BMI 27.10 kg/m  BP Readings from Last 3 Encounters:  07/03/19 135/79  05/08/19 (!) 148/62  04/28/19 108/70    Wt Readings from Last 3 Encounters:  07/03/19 153 lb (69.4 kg)  04/28/19 151 lb (68.5 kg)  04/03/19 152 lb 6.4 oz (69.1 kg)     Physical Exam Constitutional:      General: She is not in acute distress.    Appearance: She is well-developed.  HENT:     Head: Normocephalic and atraumatic.    Eyes:     Conjunctiva/sclera: Conjunctivae normal.     Pupils: Pupils are equal, round, and reactive to light.  Neck:     Thyroid: No thyromegaly.  Cardiovascular:     Rate and Rhythm: Normal rate and regular rhythm.     Heart sounds: Normal heart sounds. No murmur.  Pulmonary:     Effort: Pulmonary effort is normal. No respiratory distress.     Breath sounds: Normal breath sounds. No wheezing or rales.  Abdominal:     General: Bowel sounds are normal. There is no distension.     Palpations: Abdomen is soft.     Tenderness: There is no abdominal tenderness.  Musculoskeletal:        General: Normal range of motion.     Cervical back: Normal range of motion and neck supple.  Lymphadenopathy:     Cervical: No cervical adenopathy.  Skin:    General: Skin is warm and dry.  Neurological:     Mental Status: She is alert and oriented to person, place, and time.  Psychiatric:        Behavior: Behavior normal.        Thought Content: Thought content normal.        Judgment: Judgment normal.    Results for orders placed or performed in visit on 07/03/19  ToxASSURE Select 70 (MW), Urine  Result Value Ref Range   Summary Note   Course longus swelling personality none none she recognized the room also therefore   Assessment & Plan:   Amy Black was seen today for medical management of chronic issues.  Diagnoses and all orders for this visit:  Arthritis of neck -     nabumetone (RELAFEN) 500 MG tablet; TAKE ONE TABLET BY MOUTH TWICE DAILY FOR MUSCLE & JOINT PAIN -     methocarbamol (ROBAXIN) 500 MG tablet; Take 1 tablet (500 mg total) by mouth 4 (four) times daily.  Controlled substance agreement signed -     ToxASSURE Select 13 (MW), Urine  Anxiety  Essential hypertension  Hyperlipidemia associated with type 2 diabetes mellitus ([place])       I have discontinued Amy Black's methocarbamol. I am also having her start on methocarbamol. Additionally, I am having her  maintain her Calcium Citrate-Vitamin D, nitroGLYCERIN, aspirin EC, cyclobenzaprine, Ferrex 150, ondansetron, loperamide, hydrOXYzine, isosorbide mononitrate, fluticasone, ALPRAZolam, traMADol, triamterene-hydrochlorothiazide, pantoprazole, verapamil, and nabumetone.  Allergies as of 07/03/2019      Reactions   Iodine Itching   Amlodipine Other (See Comments)   headaches   Lexapro [escitalopram Oxalate] Other (See Comments)   Drunk, High feeling   Cymbalta [duloxetine Hcl] Other (See Comments)   sleepiness   Gabapentin Other (See Comments)   sleepiness   Ivp Dye [iodinated Diagnostic Agents] Itching   Zanaflex [tizanidine Hcl] Other (See Comments)   Very drunk feeling next day      Medication List       Accurate as of Jul 03, 2019 11:59 PM. If you have any questions,  ask your nurse or doctor.        ALPRAZolam 0.5 MG tablet Commonly known as: XANAX Take 1 tablet (0.5 mg total) by mouth 3 (three) times daily.   aspirin EC 81 MG tablet Take 81 mg by mouth daily.   Calcium Citrate-Vitamin D 315-250 MG-UNIT Tabs Commonly known as: Calcium Citrate + D3 Take 2 tablets by mouth daily.   cyclobenzaprine 5 MG tablet Commonly known as: FLEXERIL TAKE ONE TABLET BY MOUTH TWICE DAILY FOR 14 DAYS   Ferrex 150 150 MG capsule Generic drug: iron polysaccharides TAKE ONE CAPSULE BY MOUTH ONCE DAILY   fluticasone 50 MCG/ACT nasal spray Commonly known as: FLONASE Place 2 sprays into both nostrils daily.   hydrOXYzine 25 MG tablet Commonly known as: ATARAX/VISTARIL Take 25-50 mg by mouth at bedtime as needed.   isosorbide mononitrate 120 MG 24 hr tablet Commonly known as: IMDUR Take 1 tablet (120 mg total) by mouth daily.   loperamide 2 MG capsule Commonly known as: IMODIUM 1 capsule by mouth four times daily PRN   methocarbamol 500 MG tablet Commonly known as: ROBAXIN Take 1 tablet (500 mg total) by mouth 4 (four) times daily. Started by: [name], MD   nabumetone 500  MG tablet Commonly known as: RELAFEN TAKE ONE TABLET BY MOUTH TWICE DAILY FOR MUSCLE & JOINT PAIN   nitroGLYCERIN 0.4 MG SL tablet Commonly known as: Nitrostat DISSOLVE ONE TABLET UNDER TONGUE EVERY 5 MINUTES UP TO 3 DOSES AS NEEDED FOR CHEST PAIN   ondansetron 4 MG disintegrating tablet Commonly known as: Zofran ODT Take 1 tablet (4 mg total) by mouth every 8 (eight) hours as needed for nausea or vomiting.   pantoprazole 20 MG tablet Commonly known as: PROTONIX TAKE TWO (2) TABLETS BY MOUTH IN THE MORNING AND TAKE ONE TABLET IN THE EVENING   traMADol 50 MG tablet Commonly known as: ULTRAM TAKE ONE TABLET BY MOUTH EVERY TWELVE HOURS AS NEEDED.   triamterene-hydrochlorothiazide 37.5-25 MG capsule Commonly known as: DYAZIDE TAKE ONE CAPSULE BY MOUTH 3 TIMES PER WEEK   verapamil 120 MG CR tablet Commonly known as: CALAN-SR TAKE ONE TABLET BY MOUTH AT BEDTIME        Follow-up: Return in about 6 months (around 01/03/2020) for Anxiety, Pain.  [name], M.D.

## 2019-07-06 LAB — TOXASSURE SELECT 13 (MW), URINE

## 2019-07-09 ENCOUNTER — Telehealth: Payer: Self-pay | Admitting: Family Medicine

## 2019-07-09 NOTE — Telephone Encounter (Signed)
Patient aware, script of xanax has reefills and flexeril is on your medication list not tizanidine.

## 2019-07-09 NOTE — Telephone Encounter (Signed)
  Prescription Request  07/09/2019  What is the name of the medication or equipment? ALPRAZolam (XANAX) 0.5 MG tablet  And Tizanidine  Have you contacted your pharmacy to request a refill? (if applicable) yes  Which pharmacy would you like this sent to? [facility] in [place]   Patient notified that their request is being sent to the clinical staff for review and that they should receive a response within 2 business days.

## 2019-07-10 ENCOUNTER — Other Ambulatory Visit: Payer: Self-pay | Admitting: Family Medicine

## 2019-07-10 ENCOUNTER — Telehealth: Payer: Self-pay

## 2019-07-10 NOTE — Telephone Encounter (Signed)
Patient was seen on 07/03/19 and states that tizandine was supposed to be sent in.  Please advise

## 2019-07-10 NOTE — Telephone Encounter (Signed)
Patient aware and verbalized understanding. Patient states she has been on cyclobenzaprine (FLEXERIL) 5 MG tablet too long and would like to try something else she does not feel like it helps

## 2019-07-10 NOTE — Telephone Encounter (Signed)
She may have forgotten that she had a reaction to that medicine. She should continue the cyclobenzaprine instead.

## 2019-07-11 ENCOUNTER — Other Ambulatory Visit: Payer: Self-pay | Admitting: Family Medicine

## 2019-07-11 MED ORDER — METHOCARBAMOL 500 MG PO TABS: 500.0000 mg | ORAL_TABLET | Freq: Four times a day (QID) | ORAL | 1 refills | Status: DC

## 2019-07-11 NOTE — Telephone Encounter (Signed)
Pt aware.

## 2019-07-11 NOTE — Telephone Encounter (Signed)
I sent in an alternative that should help, metaxalone.

## 2019-07-12 ENCOUNTER — Encounter: Payer: Self-pay | Admitting: Family Medicine

## 2019-07-12 MED ORDER — NABUMETONE 500 MG PO TABS: ORAL_TABLET | ORAL | 1 refills | Status: DC

## 2019-07-12 MED ORDER — METHOCARBAMOL 500 MG PO TABS: 500.0000 mg | ORAL_TABLET | Freq: Four times a day (QID) | ORAL | 2 refills | Status: DC

## 2019-07-25 ENCOUNTER — Other Ambulatory Visit: Payer: Self-pay | Admitting: Cardiology

## 2019-08-29 IMAGING — RF DG ESOPHAGUS
7 of 8 series · 14 of 21 positions shown · non-contrast
Comparison: CT abdomen pelvis of 07/06/2015

CLINICAL DATA: Dysphagia

EXAM:
ESOPHOGRAM / BARIUM SWALLOW / BARIUM TABLET STUDY
TECHNIQUE: Combined double contrast and single contrast examination performed
using effervescent crystals, thick barium liquid, and thin barium
liquid. The patient was observed with fluoroscopy swallowing a 13 mm
barium sulphate tablet.
FLUOROSCOPY TIME:  Fluoroscopy Time:  1 minutes 24 seconds
Radiation Exposure Index (if provided by the fluoroscopic device):
98 mGy
Number of Acquired Spot Images: 0

[Series 1: sequence · 0.28mm/px · 2 of 16 frames shown (1 of 6)]
[frame 3/16]
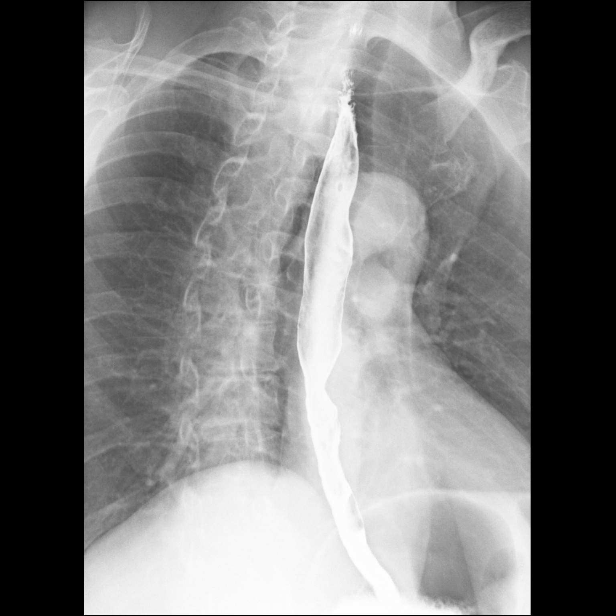
[frame 14/16]
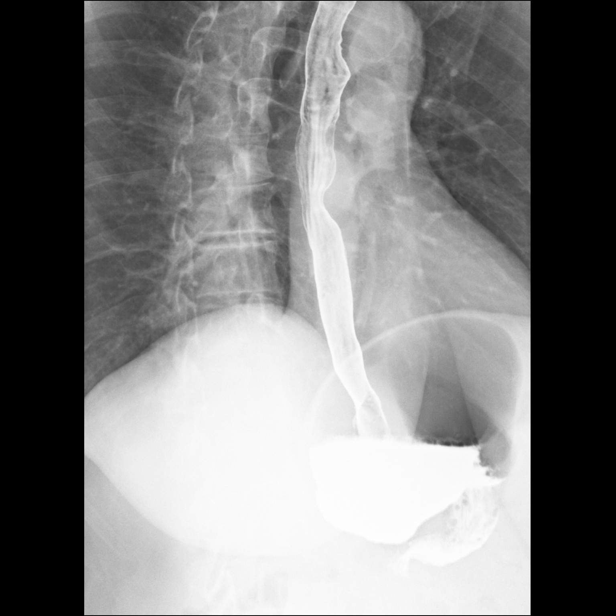

[Series 2: sequence · 0.28mm/px · 2 of 30 frames shown (2 of 6)]
[frame 5/30]
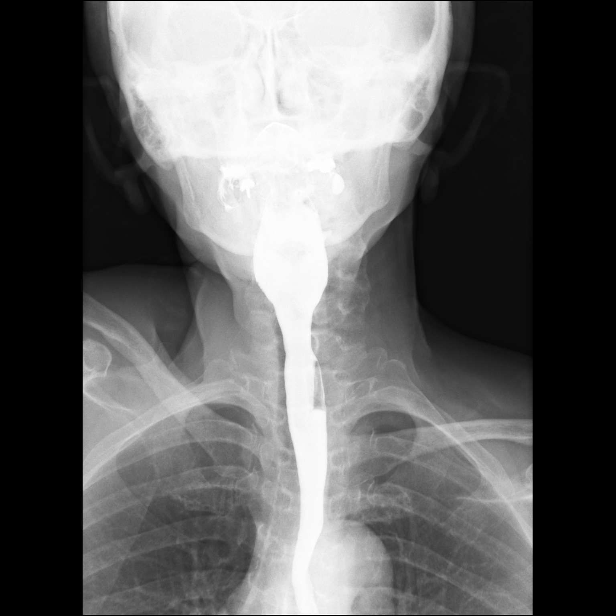
[frame 26/30]
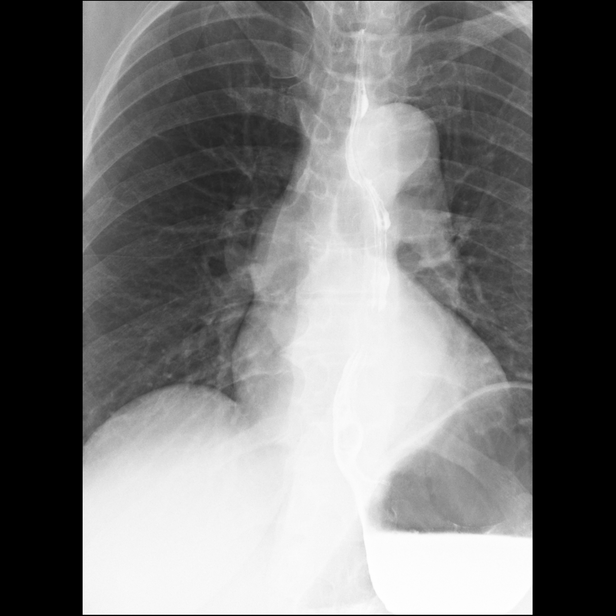

[Series 3: sequence · 0.28mm/px · 3 of 16 frames shown (3 of 6)]
[frame 1/16]
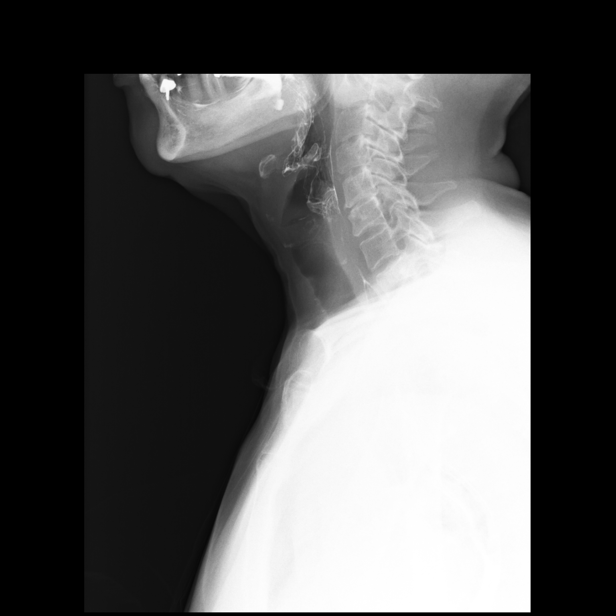
[frame 9/16]
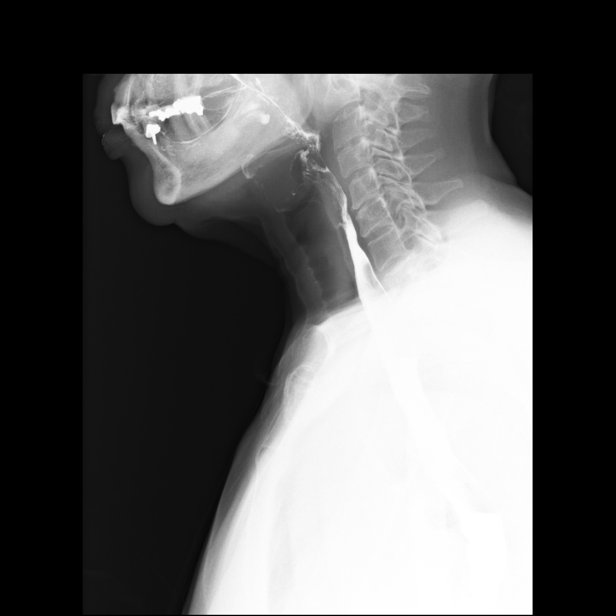
[frame 14/16]
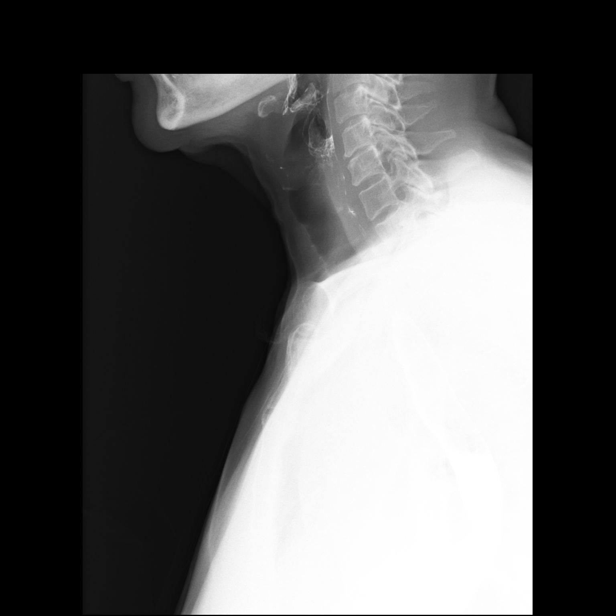

[Series 4: sequence · 0.28mm/px · 2 of 20 frames shown (4 of 6)]
[frame 4/20]
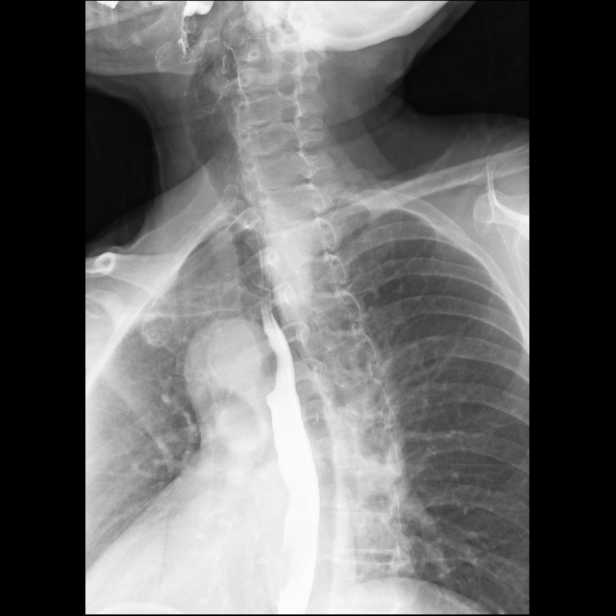
[frame 11/20]
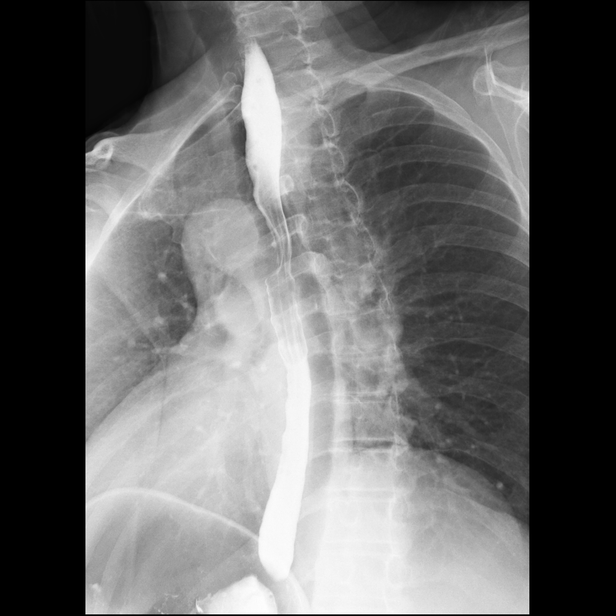

[Series 5: sequence · 0.28mm/px · 3 of 10 frames shown (5 of 6)]
[frame 2/10]
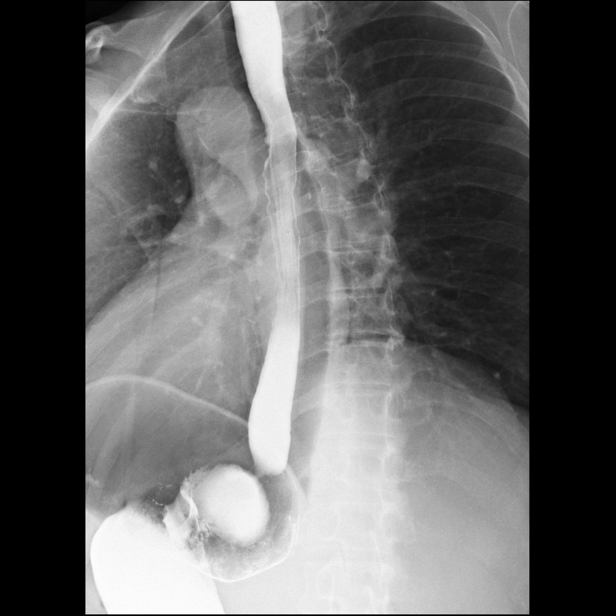
[frame 5/10]
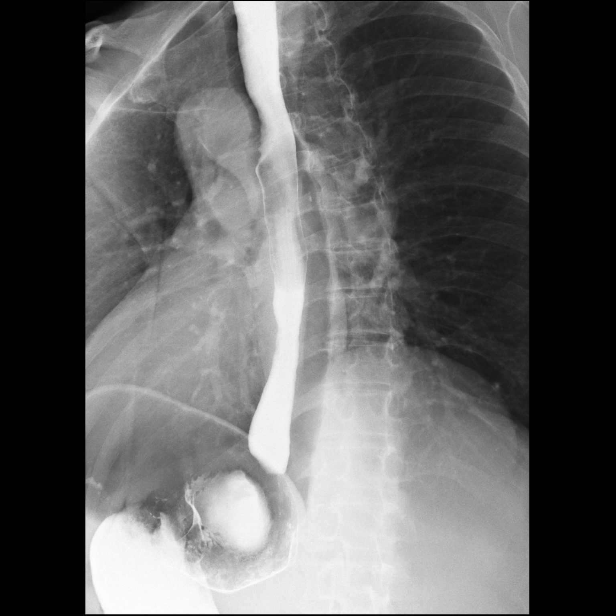
[frame 9/10]
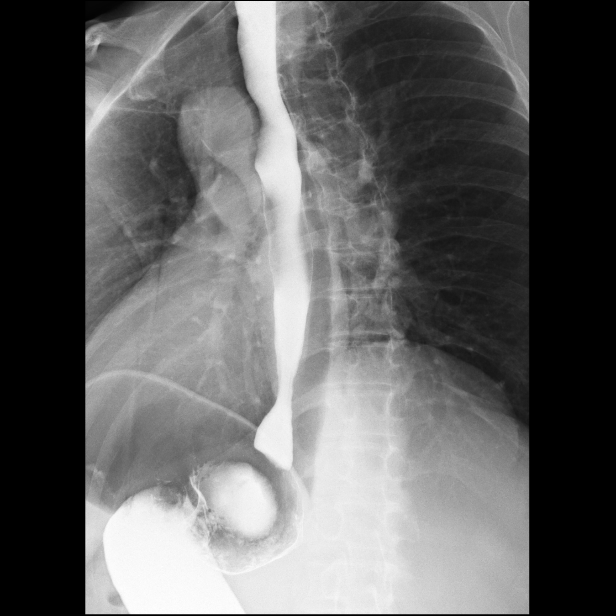

[Series 6: sequence · 0.28mm/px · 1 of 1 slices shown (6 of 6)]
[im 1/1]
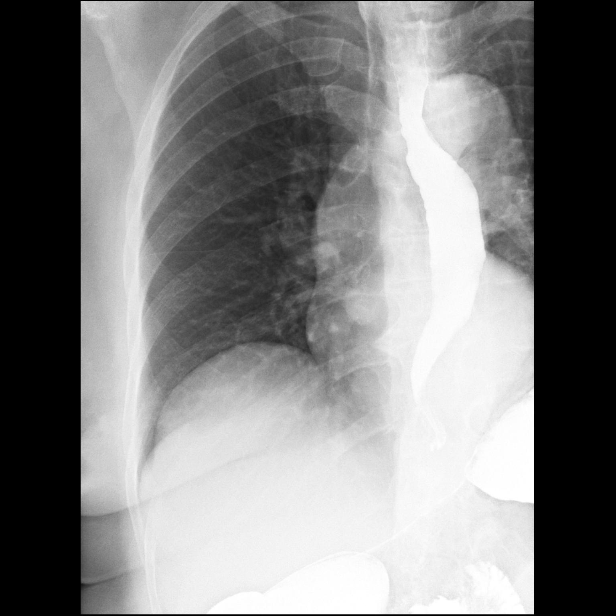

[Series 8: one shot · 1 of 1 slices shown]
[im 1/1]
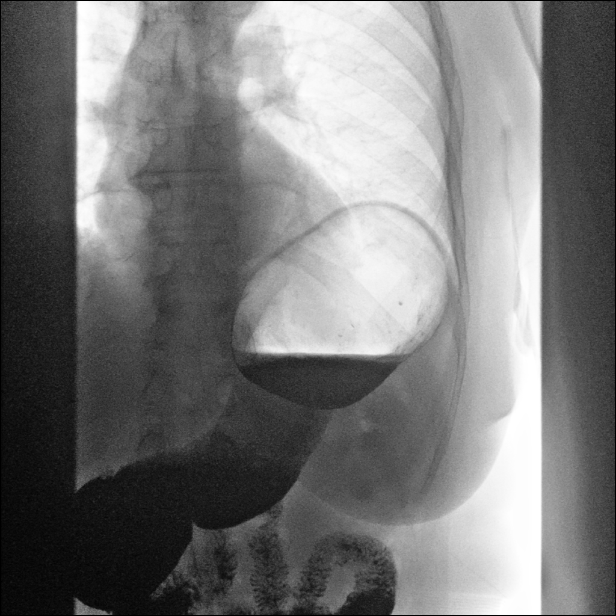

[14 of 21 positions shown; findings below may reference images not displayed]

FINDINGS: Initially double-contrast barium swallow was performed. The mucosa
of the esophagus is unremarkable. A single contrast study shows the
swallowing mechanism to be normal. No penetration or aspiration is
seen. Mild to moderate tertiary contractions are present. No hiatal
hernia is seen. Mild gastroesophageal reflux is noted. A barium pill
was given at the end of the study which did delay in passing at the
gastroesophageal junction, but after repeated swallows, did pass
into the stomach. This may indicate mild narrowing of the distal
esophagus.
IMPRESSION: 1. Mild gastroesophageal reflux.  No hiatal hernia.
2. The barium pill delays in passing at the gastroesophageal
junction which may indicate mild narrowing of the distal esophagus.
The pill did pass into the stomach after additional swallows.

## 2019-09-16 ENCOUNTER — Ambulatory Visit (INDEPENDENT_AMBULATORY_CARE_PROVIDER_SITE_OTHER): Payer: Medicare Other | Admitting: *Deleted

## 2019-09-16 DIAGNOSIS — Z0000 Encounter for general adult medical examination without abnormal findings: Secondary | ICD-10-CM | POA: Diagnosis not present

## 2019-09-16 NOTE — Progress Notes (Signed)
MEDICARE ANNUAL WELLNESS VISIT  09/16/2019   Provider calling from [facility], patient participating in call from home.   Telephone Visit Disclaimer This Medicare AWV was conducted by telephone due to national recommendations for restrictions regarding the COVID-19 Pandemic (e.g. social distancing).  I verified, using two identifiers, that I am speaking with Amy Black or their authorized healthcare agent. I discussed the limitations, risks, security, and privacy concerns of performing an evaluation and management service by telephone and the potential availability of an in-person appointment in the future. The patient expressed understanding and agreed to proceed.   Subjective:  Amy Black is a 79 y.o. female patient of [facility], Cletus Gash, MD who had a Medicare Annual Wellness Visit today via telephone. Amy Black is Disabled and lives with husband Amy Black and daughter Amy Black. She has 2 children. She reports that she is socially active and does interact with friends/family regularly. She is not physically active and enjoys shopping.  Patient Care Team: [name], MD as PCP - General (Family Medicine) Minus Breeding, MD as PCP - Cardiology (Cardiology) Lonna Cobb, PT (Inactive) as Physical Therapist (Physical Therapy) [place], Holli Humbles, NP as Nurse Practitioner (Nurse Practitioner) Doran Stabler, MD as Consulting Physician (Gastroenterology) Rod Can, MD as Consulting Physician (Orthopedic Surgery) Volanda Napoleon, MD as Consulting Physician (Oncology)  Advanced Directives 09/16/2019 04/28/2019 10/28/2018 10/21/2018 09/10/2018 06/14/2017 06/13/2017  Does Patient Have a Medical Advance Directive? No No No No No - No  Would patient like information on creating a medical advance directive? No - Patient declined No - Patient declined No - Patient declined No - Patient declined No - Patient declined Yes (MAU/Ambulatory/Procedural Areas - Information given) -  Pre-existing  out of facility DNR order (yellow form or pink MOST form) - - - - - - -    Hospital Utilization Over the Past 12 Months: # of hospitalizations or ER visits: 2 # of surgeries: 0  Review of Systems    Patient reports that her overall health is unchanged compared to last year.  History obtained from the patient and patient chart.   Patient Reported Readings (BP, Pulse, CBG, Weight, etc) none  Pain Assessment Pain : No/denies pain     Current Medications & Allergies (verified) Allergies as of 09/16/2019      Reactions   Iodine Itching   Amlodipine Other (See Comments)   headaches   Lexapro [escitalopram Oxalate] Other (See Comments)   Drunk, High feeling   Cymbalta [duloxetine Hcl] Other (See Comments)   sleepiness   Gabapentin Other (See Comments)   sleepiness   Ivp Dye [iodinated Diagnostic Agents] Itching   Zanaflex [tizanidine Hcl] Other (See Comments)   Very drunk feeling next day      Medication List       Accurate as of September 16, 2019  9:41 AM. If you have any questions, ask your nurse or doctor.        ALPRAZolam 0.5 MG tablet Commonly known as: XANAX Take 1 tablet (0.5 mg total) by mouth 3 (three) times daily.   aspirin EC 81 MG tablet Take 81 mg by mouth daily.   Calcium Citrate-Vitamin D 315-250 MG-UNIT Tabs Commonly known as: Calcium Citrate + D3 Take 2 tablets by mouth daily.   cyclobenzaprine 5 MG tablet Commonly known as: FLEXERIL TAKE ONE TABLET BY MOUTH TWICE DAILY FOR 14 DAYS   Ferrex 150 150 MG capsule Generic drug: iron polysaccharides TAKE ONE CAPSULE BY MOUTH ONCE DAILY  fluticasone 50 MCG/ACT nasal spray Commonly known as: FLONASE Place 2 sprays into both nostrils daily.   hydrOXYzine 25 MG tablet Commonly known as: ATARAX/VISTARIL Take 25-50 mg by mouth at bedtime as needed.   isosorbide mononitrate 120 MG 24 hr tablet Commonly known as: IMDUR Take 1 tablet (120 mg total) by mouth daily.   loperamide 2 MG  capsule Commonly known as: IMODIUM 1 capsule by mouth four times daily PRN   methocarbamol 500 MG tablet Commonly known as: ROBAXIN Take 1 tablet (500 mg total) by mouth 4 (four) times daily.   nabumetone 500 MG tablet Commonly known as: RELAFEN TAKE ONE TABLET BY MOUTH TWICE DAILY FOR MUSCLE & JOINT PAIN   nitroGLYCERIN 0.4 MG SL tablet Commonly known as: NITROSTAT DISSOLVE ONE TABLET UNDER TONGUE EVERY 5 MINUTES UP TO 3 DOSES AS NEEDED FOR CHEST PAIN   ondansetron 4 MG disintegrating tablet Commonly known as: Zofran ODT Take 1 tablet (4 mg total) by mouth every 8 (eight) hours as needed for nausea or vomiting.   pantoprazole 20 MG tablet Commonly known as: PROTONIX TAKE TWO (2) TABLETS BY MOUTH IN THE MORNING AND TAKE ONE TABLET IN THE EVENING   traMADol 50 MG tablet Commonly known as: ULTRAM TAKE ONE TABLET BY MOUTH EVERY TWELVE HOURS AS NEEDED.   triamterene-hydrochlorothiazide 37.5-25 MG capsule Commonly known as: DYAZIDE TAKE ONE CAPSULE BY MOUTH 3 TIMES PER WEEK   verapamil 120 MG CR tablet Commonly known as: CALAN-SR TAKE ONE TABLET BY MOUTH AT BEDTIME       History (reviewed): Past Medical History:  Diagnosis Date  . Adenocarcinoma (HCC)    LEFT LEG  . Adenocarcinoma of unknown primary ([place]) 02/06/2011  . 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 Allergy   . Anxiety   . CAD (coronary artery disease)    a. minimal CAD by cath in 2016 with presumed spasm (20% mLAD, 20% D2).  . Cataract    bilateral cataracts removed  . Coronary artery spasm ([place])   . Diverticulosis of colon (without mention of hemorrhage)   . GERD (gastroesophageal reflux disease)   . hepatic cyst   . Hip fracture, right ([place])   . History of cardiac catheterization   . History of nuclear stress test 04/2011   lexiscan; normal study, no significant ischemia, low risk; 2015 - normal  . HTN (hypertension)    PT. DENIES  . Hyperlipidemia   . Insomnia   .  Irritable bowel syndrome   . Transverse myelitis ([place])   . Vitamin D deficiency    Past Surgical History:  Procedure Laterality Date  . ABDOMINAL HYSTERECTOMY Bilateral   . CARDIAC CATHETERIZATION     coronary spasm  . CARDIAC CATHETERIZATION N/A 08/28/2014   Procedure: Left Heart Cath and Coronary Angiography;  Surgeon: Jettie Booze, MD;  Location: [facility];  Service: Cardiovascular;  Laterality: N/A;  . COLONOSCOPY    . DIAGNOSTIC LAPAROSCOPY    . LYSIS OF ADHESION    . PELVIC LAPAROSCOPY  1992   RSO, AND LSO ON 2007  . RESECTION SOFT TISSUE TUMOR LEG / ANKLE RADICAL  2004   leg lesion resection & radiation  . SALPINGOOPHORECTOMY Left   . TOTAL HIP ARTHROPLASTY Right 10/04/2015   Procedure: TOTAL HIP ARTHROPLASTY ANTERIOR APPROACH;  Surgeon: Rod Can, MD;  Location: [facility];  Service: Orthopedics;  Laterality: Right;  . TRANSTHORACIC ECHOCARDIOGRAM  07/2012   LV cavity size  mildly reduced, normal wall motion; MV with calcified annulus and mild MR; LA mildly dilated; atrial septum with increased thickness - lipomatous hypertrophy; RV systolic pressure increased (borderline pulm HTN)  . UPPER GASTROINTESTINAL ENDOSCOPY     Family History  Problem Relation Age of Onset  . Bladder Cancer Mother   . Heart disease Mother   . Hypertension Mother   . Heart disease Father   . Stroke Father   . Diabetes Other        Multiple family members on both sides   . Coronary artery disease Other        Multiple family members on both sides   . Hyperlipidemia Sister   . Depression Sister   . Diabetes Brother   . Seizures Brother   . Hypertension Brother   . Colon cancer Neg Hx   . Esophageal cancer Neg Hx   . Pancreatic cancer Neg Hx   . Rectal cancer Neg Hx   . Stomach cancer Neg Hx    Social History   Socioeconomic History  . Marital status: Married    Spouse name: Amy Black  . Number of children: 2  . Years of education: 25  . Highest education level: High school  graduate  Occupational History  . Occupation: Disabilty  . Occupation: DISABILITY    Employer: UNEMPLOYED  Tobacco Use  . Smoking status: Former Smoker    Packs/day: 0.50    Years: 4.00    Pack years: 2.00    Types: Cigarettes    Start date: 12/07/1968    Quit date: 11/20/1972    Years since quitting: 46.8  . Smokeless tobacco: Never Used  . Tobacco comment: quit 40 years ago  Vaping Use  . Vaping Use: Never used  Substance and Sexual Activity  . Alcohol use: No    Alcohol/week: 0.0 standard drinks  . Drug use: No  . Sexual activity: Not Currently    Birth control/protection: Surgical  Other Topics Concern  . Not on file  Social History Narrative   Disabled, lives with husband and daughter.  Enjoys shopping.    Social Determinants of Health   Financial Resource Strain:   . Difficulty of Paying Living Expenses:   Food Insecurity:   . Worried About Charity fundraiser in the Last Year:   . Arboriculturist in the Last Year:   Transportation Needs:   . Film/video editor (Medical):   Marland Kitchen Lack of Transportation (Non-Medical):   Physical Activity:   . Days of Exercise per Week:   . Minutes of Exercise per Session:   Stress:   . Feeling of Stress :   Social Connections:   . Frequency of Communication with Friends and Family:   . Frequency of Social Gatherings with Friends and Family:   . Attends Religious Services:   . Active Member of Clubs or Organizations:   . Attends Archivist Meetings:   Marland Kitchen Marital Status:     Activities of Daily Living In your present state of health, do you have any difficulty performing the following activities: 09/16/2019  Hearing? Y  Comment has hearing aids  Vision? N  Difficulty concentrating or making decisions? N  Walking or climbing stairs? Y  Comment avoids  Dressing or bathing? N  Doing errands, shopping? Y  Comment daughter assists  Conservation officer, nature and eating ? N  Using the Toilet? N  In the past six months, have  you accidently leaked urine? N  Do  you have problems with loss of bowel control? N  Managing your Medications? N  Managing your Finances? N  Housekeeping or managing your Housekeeping? Y  Comment daughter takes care of housekeeping  Some recent data might be hidden    Patient Education/ Literacy How often do you need to have someone help you when you read instructions, pamphlets, or other written materials from your doctor or pharmacy?: 1 - Never What is the last grade level you completed in school?: 12  Exercise Current Exercise Habits: The patient does not participate in regular exercise at present, Exercise limited by: Other - see comments (transverse mylitis)  Diet Patient reports consuming 3 meals a day and 2 snack(s) a day Patient reports that her primary diet is: Regular Patient reports that she does have regular access to food.   Depression Screen PHQ 2/9 Scores 09/16/2019 07/03/2019 04/03/2019 09/25/2018 09/10/2018 06/18/2018 04/15/2018  PHQ - 2 Score 0 0 0 0 0 0 0  PHQ- 9 Score - - - - - 0 -     Fall Risk Fall Risk  09/16/2019 07/03/2019 04/03/2019 09/25/2018 09/10/2018  Falls in the past year? 0 0 0 0 0  Number falls in past yr: - - 0 - -  Injury with Fall? - - 0 - -  Risk Factor Category  - - - - -  Risk for fall due to : - - Impaired mobility;Impaired balance/gait - -  Follow up - Falls evaluation completed Falls evaluation completed - -     Objective:  Amy Black seemed alert and oriented and she participated appropriately during our telephone visit.  Blood Pressure Weight BMI  BP Readings from Last 3 Encounters:  07/03/19 135/79  05/08/19 (!) 148/62  04/28/19 108/70   Wt Readings from Last 3 Encounters:  07/03/19 153 lb (69.4 kg)  04/28/19 151 lb (68.5 kg)  04/03/19 152 lb 6.4 oz (69.1 kg)   BMI Readings from Last 1 Encounters:  07/03/19 27.10 kg/m    *Unable to obtain current vital signs, weight, and BMI due to telephone visit type  Hearing/Vision   . Amy Black did not seem to have difficulty with hearing/understanding during the telephone conversation . Reports that she has had a formal eye exam by an eye care professional within the past year . Reports that she has had a formal hearing evaluation within the past year *Unable to fully assess hearing and vision during telephone visit type  Cognitive Function: 6CIT Screen 09/16/2019 09/10/2018  What Year? 0 points 0 points  What month? 0 points 0 points  What time? 0 points 0 points  Count back from 20 2 points 0 points  Months in reverse 4 points 2 points  Repeat phrase 0 points 0 points  Total Score 6 2   (Normal:0-7, Significant for Dysfunction: >8)  Normal Cognitive Function Screening: yes   Immunization & Health Maintenance Record Immunization History  Administered Date(s) Administered  . PFIZER SARS-COV-2 Vaccination 05/08/2019, 05/29/2019  . Pneumococcal Polysaccharide-23 02/07/2000  . Tdap 02/06/2002  . Zoster 02/06/2010  . Zoster Recombinat (Shingrix) 11/06/2016, 01/08/2017    Health Maintenance  Topic Date Due  . Hepatitis C Screening  Never done  . URINE MICROALBUMIN  Never done  . PAP SMEAR-Modifier  11/28/2011  . TETANUS/TDAP  02/07/2012  . DEXA SCAN  12/06/2017  . INFLUENZA VACCINE  09/07/2019  . PNA vac Low Risk Adult (1 of 2 - PCV13) 09/15/2020 (Originally 10/13/2007)  . MAMMOGRAM  05/12/2020  . COLONOSCOPY  08/11/2020  . COVID-19 Vaccine  Completed       Assessment  This is a routine wellness examination for Amy Black.  Health Maintenance: Due or Overdue Health Maintenance Due  Topic Date Due  . Hepatitis C Screening  Never done  . URINE MICROALBUMIN  Never done  . PAP SMEAR-Modifier  11/28/2011  . TETANUS/TDAP  02/07/2012  . DEXA SCAN  12/06/2017  . INFLUENZA VACCINE  09/07/2019    Amy Black does not need a referral for Community Assistance: Care Management:   no Social Work:    no Prescription  Assistance:  no Nutrition/Diabetes Education:  no   Plan:  Personalized Goals Goals Addressed            This Visit's Progress   . Patient Stated       09/16/2019 AWV Goal: Keep All Scheduled Appointments  Over the next year, patient will attend all scheduled appointments with their PCP and any specialists that they see.       Personalized Health Maintenance & Screening Recommendations    Lung Cancer Screening Recommended: no (Low Dose CT Chest recommended if Age 8-80 years, 30 pack-year currently smoking OR have quit w/in past 15 years) Hepatitis C Screening recommended: no HIV Screening recommended: no  Advanced Directives: Written information was not prepared per patient's request.  Referrals & Orders No orders of the defined types were placed in this encounter.   Follow-up Plan . Follow-up with [name], MD as planned   I have personally reviewed and noted the following in the patient's chart:   . Medical and social history . Use of alcohol, tobacco or illicit drugs  . Current medications and supplements . Functional ability and status . Nutritional status . Physical activity . Advanced directives . List of other physicians . Hospitalizations, surgeries, and ER visits in previous 12 months . Vitals . Screenings to include cognitive, depression, and falls . Referrals and appointments  In addition, I have reviewed and discussed with Amy Black certain preventive protocols, quality metrics, and best practice recommendations. A written personalized care plan for preventive services as well as general preventive health recommendations is available and can be mailed to the patient at her request.     Baldomero Lamy, LPN  [id number]

## 2019-09-26 ENCOUNTER — Other Ambulatory Visit: Payer: Self-pay | Admitting: Gastroenterology

## 2019-09-26 DIAGNOSIS — K5900 Constipation, unspecified: Secondary | ICD-10-CM | POA: Diagnosis not present

## 2019-09-26 DIAGNOSIS — R1084 Generalized abdominal pain: Secondary | ICD-10-CM

## 2019-10-02 ENCOUNTER — Telehealth: Payer: Self-pay | Admitting: Nurse Practitioner

## 2019-10-02 NOTE — Telephone Encounter (Signed)
Phone call to patient to review instructions for 13 hr prep for CT w/ contrast on 10/08/2019 at 1240. Prescription called into [facility]. Pt aware and verbalized understanding of instructions. Prescription: 2340- 50mg  Prednisone 0540- 50mg  Prednisone 1140- 50mg  Prednisone and 50mg  Benadryl

## 2019-10-08 ENCOUNTER — Ambulatory Visit
Admission: RE | Admit: 2019-10-08 | Discharge: 2019-10-08 | Disposition: A | Discharge status: Home / Self Care | Payer: Medicare Other | Source: Ambulatory Visit | Attending: Gastroenterology | Admitting: Gastroenterology

## 2019-10-08 DIAGNOSIS — R109 Unspecified abdominal pain: Secondary | ICD-10-CM | POA: Diagnosis not present

## 2019-10-08 DIAGNOSIS — R1084 Generalized abdominal pain: Secondary | ICD-10-CM

## 2019-10-08 MED ORDER — IOPAMIDOL (ISOVUE-300) INJECTION 61%
100.0000 mL | Freq: Once | INTRAVENOUS | Status: AC | PRN
  Administered 2019-10-08: 100 mL via INTRAVENOUS

## 2019-10-14 ENCOUNTER — Other Ambulatory Visit: Payer: Self-pay | Admitting: Family Medicine

## 2019-10-27 ENCOUNTER — Telehealth: Payer: Self-pay | Admitting: Family Medicine

## 2019-10-27 NOTE — Telephone Encounter (Signed)
Pt asked to talk to [facility] nurse. I asked what was the message she said I could not help her and for a nurse to call her back.

## 2019-10-27 NOTE — Telephone Encounter (Signed)
Spoke with patient. She wants to change her PCP to [facility]. States that she has an appt with [facility] on Thursday to discuss her Xanax refill but she is in need of the prescription before then. Informed patient that Dr. Warrick Parisian was not accepting new pt's. I started to clarify that I would need to ask if she could switch to [facility] but pt said " ok thanks bye" then hung up.

## 2019-10-27 NOTE — Telephone Encounter (Signed)
Returned call- Patient is requesting to ONLY speak to Dr. Neldon Mc nurse.  Advise that she was with patients and I could help. And she states no she ONLY wanted to speak to Dr. Neldon Mc nurse.  Previous note states she wanted to talk to [facility] nurse and she states that she already figured that out and she just needed to talk to Secretary/administrator.

## 2019-10-29 ENCOUNTER — Inpatient Hospital Stay: Payer: Medicare Other

## 2019-10-29 ENCOUNTER — Inpatient Hospital Stay: Payer: Medicare Other | Attending: Family | Admitting: Hematology & Oncology

## 2019-10-29 ENCOUNTER — Encounter: Payer: Self-pay | Admitting: Hematology & Oncology

## 2019-10-29 ENCOUNTER — Other Ambulatory Visit: Payer: Self-pay

## 2019-10-29 ENCOUNTER — Telehealth: Payer: Self-pay | Admitting: Hematology & Oncology

## 2019-10-29 VITALS — BP 138/69 | HR 71 | Temp 98.1°F | Resp 18 | Wt 150.2 lb

## 2019-10-29 DIAGNOSIS — D75839 Thrombocytosis, unspecified: Secondary | ICD-10-CM

## 2019-10-29 DIAGNOSIS — C801 Malignant (primary) neoplasm, unspecified: Secondary | ICD-10-CM

## 2019-10-29 DIAGNOSIS — D649 Anemia, unspecified: Secondary | ICD-10-CM | POA: Diagnosis not present

## 2019-10-29 DIAGNOSIS — Z862 Personal history of diseases of the blood and blood-forming organs and certain disorders involving the immune mechanism: Secondary | ICD-10-CM | POA: Diagnosis not present

## 2019-10-29 DIAGNOSIS — Z859 Personal history of malignant neoplasm, unspecified: Secondary | ICD-10-CM | POA: Insufficient documentation

## 2019-10-29 LAB — CBC WITH DIFFERENTIAL (CANCER CENTER ONLY)
Abs Immature Granulocytes: 0 10*3/uL (ref 0.00–0.07)
Basophils Absolute: 0.1 10*3/uL (ref 0.0–0.1)
Basophils Relative: 1 %
Eosinophils Absolute: 0.2 10*3/uL (ref 0.0–0.5)
Eosinophils Relative: 3 %
HCT: 34.3 % — ABNORMAL LOW (ref 36.0–46.0)
Hemoglobin: 10.9 g/dL — ABNORMAL LOW (ref 12.0–15.0)
Immature Granulocytes: 0 %
Lymphocytes Relative: 35 %
Lymphs Abs: 1.9 10*3/uL (ref 0.7–4.0)
MCH: 31.2 pg (ref 26.0–34.0)
MCHC: 31.8 g/dL (ref 30.0–36.0)
MCV: 98.3 fL (ref 80.0–100.0)
Monocytes Absolute: 0.5 10*3/uL (ref 0.1–1.0)
Monocytes Relative: 9 %
Neutro Abs: 2.8 10*3/uL (ref 1.7–7.7)
Neutrophils Relative %: 52 %
Platelet Count: 388 10*3/uL (ref 150–400)
RBC: 3.49 MIL/uL — ABNORMAL LOW (ref 3.87–5.11)
RDW: 13.4 % (ref 11.5–15.5)
WBC Count: 5.4 10*3/uL (ref 4.0–10.5)
nRBC: 0 % (ref 0.0–0.2)

## 2019-10-29 LAB — CMP (CANCER CENTER ONLY)
ALT: 9 U/L (ref 0–44)
AST: 13 U/L — ABNORMAL LOW (ref 15–41)
Albumin: 4.4 g/dL (ref 3.5–5.0)
Alkaline Phosphatase: 77 U/L (ref 38–126)
Anion gap: 6 (ref 5–15)
BUN: 11 mg/dL (ref 8–23)
CO2: 31 mmol/L (ref 22–32)
Calcium: 10 mg/dL (ref 8.9–10.3)
Chloride: 99 mmol/L (ref 98–111)
Creatinine: 0.83 mg/dL (ref 0.44–1.00)
GFR, Est AFR Am: >60 mL/min (ref >60)
GFR, Estimated: >60 mL/min (ref >60)
Glucose, Bld: 96 mg/dL (ref 70–99)
Potassium: 3.9 mmol/L (ref 3.5–5.1)
Sodium: 136 mmol/L (ref 135–145)
Total Bilirubin: 0.6 mg/dL (ref 0.3–1.2)
Total Protein: 7.3 g/dL (ref 6.5–8.1)

## 2019-10-29 LAB — SAVE SMEAR(SSMR), FOR PROVIDER SLIDE REVIEW

## 2019-10-29 MED ORDER — FUSION PLUS PO CAPS: 1.0000 | ORAL_CAPSULE | Freq: Every day | ORAL | 6 refills | Status: DC

## 2019-10-29 NOTE — Progress Notes (Signed)
Hematology and Oncology Follow Up Visit  Amy Black [id number] 05-Aug-1940 79 y.o. 10/29/2019   Principle Diagnosis:  Transient thrombocytosis History transverse myelitis History of adenosquamous carcinoma of unknown primary Iron def anemia  Current Therapy:   Fusion Plus -- daily   Interim History:  Amy Black is here today for follow-up.  She seems to be doing pretty well.  We last saw her 6 months ago.  She went out to [place], [place] to see a grandson.  He is in the [employer].  She has had no problems with bowels or bladder.  She has had no obvious bleeding.  When we last saw her 6 months ago, she did have a little bit of low iron.  I think her ferritin was 81 with an iron saturation of 20%.  She was taking some oral iron.  I think she may have run out of oral iron.  We will try her on Fusion Plus.  She has had no cough or shortness of breath.  She has had a coronavirus vaccines.  There has been no rashes.  She has had no leg swelling.  She has had no headache.  Overall, her performance status is ECOG 1.   Medications:  Allergies as of 10/29/2019      Reactions   Iodine Itching   Amlodipine Other (See Comments)   headaches   Lexapro [escitalopram Oxalate] Other (See Comments)   Drunk, High feeling   Cymbalta [duloxetine Hcl] Other (See Comments)   sleepiness   Gabapentin Other (See Comments)   sleepiness   Ivp Dye [iodinated Diagnostic Agents] Itching   Zanaflex [tizanidine Hcl] Other (See Comments)   Very drunk feeling next day      Medication List       Accurate as of October 29, 2019  2:31 PM. If you have any questions, ask your nurse or doctor.        aspirin EC 81 MG tablet Take 81 mg by mouth daily.   Calcium Citrate-Vitamin D 315-250 MG-UNIT Tabs Commonly known as: Calcium Citrate + D3 Take 2 tablets by mouth daily.   cyclobenzaprine 5 MG tablet Commonly known as: FLEXERIL TAKE ONE TABLET BY MOUTH TWICE DAILY FOR 14 DAYS   Ferrex 150 150 MG  capsule Generic drug: iron polysaccharides TAKE ONE CAPSULE BY MOUTH ONCE DAILY   fluticasone 50 MCG/ACT nasal spray Commonly known as: FLONASE Place 2 sprays into both nostrils daily.   hydrOXYzine 25 MG tablet Commonly known as: ATARAX/VISTARIL Take 25-50 mg by mouth at bedtime as needed.   isosorbide mononitrate 120 MG 24 hr tablet Commonly known as: IMDUR Take 1 tablet (120 mg total) by mouth daily.   loperamide 2 MG capsule Commonly known as: IMODIUM 1 capsule by mouth four times daily PRN   methocarbamol 500 MG tablet Commonly known as: ROBAXIN Take 1 tablet (500 mg total) by mouth 4 (four) times daily.   nabumetone 500 MG tablet Commonly known as: RELAFEN TAKE ONE TABLET BY MOUTH TWICE DAILY FOR MUSCLE & JOINT PAIN   nitroGLYCERIN 0.4 MG SL tablet Commonly known as: NITROSTAT DISSOLVE ONE TABLET UNDER TONGUE EVERY 5 MINUTES UP TO 3 DOSES AS NEEDED FOR CHEST PAIN   ondansetron 4 MG disintegrating tablet Commonly known as: Zofran ODT Take 1 tablet (4 mg total) by mouth every 8 (eight) hours as needed for nausea or vomiting.   pantoprazole 20 MG tablet Commonly known as: PROTONIX TAKE TWO (2) TABLETS BY MOUTH IN THE MORNING AND TAKE  ONE TABLET IN THE EVENING   traMADol 50 MG tablet Commonly known as: ULTRAM TAKE ONE TABLET BY MOUTH EVERY TWELVE HOURS AS NEEDED.   triamterene-hydrochlorothiazide 37.5-25 MG capsule Commonly known as: DYAZIDE TAKE ONE CAPSULE BY MOUTH 3 TIMES PER WEEK   verapamil 120 MG CR tablet Commonly known as: CALAN-SR TAKE ONE TABLET BY MOUTH AT BEDTIME       Allergies:  Allergies  Allergen Reactions  . Iodine Itching  . Amlodipine Other (See Comments)    headaches  . Lexapro [Escitalopram Oxalate] Other (See Comments)    Drunk, High feeling  . Cymbalta [Duloxetine Hcl] Other (See Comments)    sleepiness  . Gabapentin Other (See Comments)    sleepiness  . Ivp Dye [Iodinated Diagnostic Agents] Itching  . Zanaflex [Tizanidine  Hcl] Other (See Comments)    Very drunk feeling next day    Past Medical History, Surgical history, Social history, and Family History were reviewed and updated.  Review of Systems: Review of Systems  Constitutional: Negative.   HENT: Negative.   Eyes: Negative.   Respiratory: Negative.   Cardiovascular: Negative.   Gastrointestinal: Negative.   Genitourinary: Negative.   Musculoskeletal: Negative.   Skin: Negative.   Neurological: Negative.   Endo/Heme/Allergies: Negative.   Psychiatric/Behavioral: Negative.       Physical Exam:  weight is 150 lb 4 oz (68.2 kg). Her oral temperature is 98.1 F (36.7 C). Her blood pressure is 138/69 and her pulse is 71. Her respiration is 18 and oxygen saturation is 100%.   Wt Readings from Last 3 Encounters:  10/29/19 150 lb 4 oz (68.2 kg)  07/03/19 153 lb (69.4 kg)  04/28/19 151 lb (68.5 kg)    Physical Exam Vitals reviewed.  HENT:     Head: Normocephalic and atraumatic.  Eyes:     Pupils: Pupils are equal, round, and reactive to light.  Cardiovascular:     Rate and Rhythm: Normal rate and regular rhythm.     Heart sounds: Normal heart sounds.  Pulmonary:     Effort: Pulmonary effort is normal.     Breath sounds: Normal breath sounds.  Abdominal:     General: Bowel sounds are normal.     Palpations: Abdomen is soft.  Musculoskeletal:        General: No tenderness or deformity. Normal range of motion.     Cervical back: Normal range of motion.  Lymphadenopathy:     Cervical: No cervical adenopathy.  Skin:    General: Skin is warm and dry.     Findings: No erythema or rash.  Neurological:     Mental Status: She is alert and oriented to person, place, and time.  Psychiatric:        Behavior: Behavior normal.        Thought Content: Thought content normal.        Judgment: Judgment normal.      Lab Results  Component Value Date   WBC 5.4 10/29/2019   HGB 10.9 (L) 10/29/2019   HCT 34.3 (L) 10/29/2019   MCV 98.3  10/29/2019   PLT 388 10/29/2019   Lab Results  Component Value Date   FERRITIN 81 04/28/2019   IRON 52 04/28/2019   TIBC 259 04/28/2019   UIBC 207 04/28/2019   IRONPCTSAT 20 (L) 04/28/2019   Lab Results  Component Value Date   RETICCTPCT 1.6 10/03/2017   RBC 3.49 (L) 10/29/2019   No results found for: KPAFRELGTCHN, LAMBDASER, KAPLAMBRATIO No results found  for: Kandis Cocking, IGMSERUM No results found for: Odetta Pink, SPEI   Chemistry      Component Value Date/Time   NA 136 10/29/2019 1315   NA 139 01/27/2019 1053   NA 138 01/28/2016 1303   K 3.9 10/29/2019 1315   K 4.2 07/28/2016 1243   K 4.2 01/28/2016 1303   CL 99 10/29/2019 1315   CL 98 07/28/2016 1243   CL 101 07/20/2014 1131   CO2 31 10/29/2019 1315   CO2 29 07/28/2016 1243   CO2 30 (H) 01/28/2016 1303   BUN 11 10/29/2019 1315   BUN 15 01/27/2019 1053   BUN 10.0 01/28/2016 1303   CREATININE 0.83 10/29/2019 1315   CREATININE 0.94 07/28/2016 1243   CREATININE 0.8 01/28/2016 1303      Component Value Date/Time   CALCIUM 10.0 10/29/2019 1315   CALCIUM 9.9 07/28/2016 1243   CALCIUM 10.0 01/28/2016 1303   ALKPHOS 77 10/29/2019 1315   ALKPHOS 105 07/28/2016 1243   ALKPHOS 123 01/28/2016 1303   AST 13 (L) 10/29/2019 1315   AST 13 01/28/2016 1303   ALT 9 10/29/2019 1315   ALT 12 01/28/2016 1303   BILITOT 0.6 10/29/2019 1315   BILITOT 0.51 01/28/2016 1303       Impression and Plan: Amy Black is a very pleasant 79yo African American female with history of transient thrombocytosis and also history of adenosquamous carcinoma with unknown primary.   So far, there is no evidence of recurrence of the adenosquamous carcinoma.  This was in her leg.  She had surgery and radiation for this.  This had to be about 16 years ago.  She is little more anemic today.  I am sure that her iron is on the low side.  We will have to see about getting her back on oral iron.  If  this does not work, then we will have to do IV iron.  I will have to get her back probably close to the holidays to make sure that her anemia is improving.      Volanda Napoleon, MD 9/22/20212:31 PM

## 2019-10-29 NOTE — Telephone Encounter (Signed)
Appointments scheduled calendar printed per 9/22 los

## 2019-10-30 ENCOUNTER — Other Ambulatory Visit: Payer: Self-pay

## 2019-10-30 ENCOUNTER — Ambulatory Visit (INDEPENDENT_AMBULATORY_CARE_PROVIDER_SITE_OTHER): Payer: Medicare Other | Admitting: Family Medicine

## 2019-10-30 ENCOUNTER — Encounter: Payer: Self-pay | Admitting: Family Medicine

## 2019-10-30 VITALS — BP 127/76 | HR 79 | Temp 96.7°F | Resp 20 | Ht 63.0 in | Wt 151.0 lb

## 2019-10-30 DIAGNOSIS — G373 Acute transverse myelitis in demyelinating disease of central nervous system: Secondary | ICD-10-CM | POA: Diagnosis not present

## 2019-10-30 DIAGNOSIS — M47812 Spondylosis without myelopathy or radiculopathy, cervical region: Secondary | ICD-10-CM | POA: Diagnosis not present

## 2019-10-30 DIAGNOSIS — I208 Other forms of angina pectoris: Secondary | ICD-10-CM | POA: Diagnosis not present

## 2019-10-30 DIAGNOSIS — F419 Anxiety disorder, unspecified: Secondary | ICD-10-CM

## 2019-10-30 DIAGNOSIS — I10 Essential (primary) hypertension: Secondary | ICD-10-CM

## 2019-10-30 DIAGNOSIS — R350 Frequency of micturition: Secondary | ICD-10-CM | POA: Diagnosis not present

## 2019-10-30 DIAGNOSIS — I2089 Other forms of angina pectoris: Secondary | ICD-10-CM

## 2019-10-30 LAB — LACTATE DEHYDROGENASE: LDH: 155 U/L (ref 98–192)

## 2019-10-30 LAB — URINALYSIS, COMPLETE
Bilirubin, UA: NEGATIVE
Glucose, UA: NEGATIVE
Ketones, UA: NEGATIVE
Nitrite, UA: NEGATIVE
Protein,UA: NEGATIVE
RBC, UA: NEGATIVE
Specific Gravity, UA: 1.015 (ref 1.005–1.030)
Urobilinogen, Ur: 0.2 mg/dL (ref 0.2–1.0)
pH, UA: 6 (ref 5.0–7.5)

## 2019-10-30 LAB — IRON AND TIBC
Iron: 107 ug/dL (ref 41–142)
Saturation Ratios: 43 % (ref 21–57)
TIBC: 246 ug/dL (ref 236–444)
UIBC: 140 ug/dL (ref 120–384)

## 2019-10-30 LAB — MICROSCOPIC EXAMINATION
Bacteria, UA: NONE SEEN
RBC, Urine: NONE SEEN /hpf (ref 0–2)
WBC, UA: NONE SEEN /hpf (ref 0–5)

## 2019-10-30 LAB — FERRITIN: Ferritin: 141 ng/mL (ref 11–307)

## 2019-10-30 MED ORDER — CYCLOBENZAPRINE HCL 5 MG PO TABS: 5.0000 mg | ORAL_TABLET | Freq: Three times a day (TID) | ORAL | 5 refills | Status: DC | PRN

## 2019-10-30 MED ORDER — TRIAMTERENE-HCTZ 37.5-25 MG PO CAPS: ORAL_CAPSULE | ORAL | 5 refills | Status: DC

## 2019-10-30 MED ORDER — TRAMADOL HCL 50 MG PO TABS: ORAL_TABLET | ORAL | 5 refills | Status: DC

## 2019-10-30 MED ORDER — ALPRAZOLAM 0.5 MG PO TABS: 0.5000 mg | ORAL_TABLET | Freq: Three times a day (TID) | ORAL | 5 refills | Status: DC

## 2019-10-30 NOTE — Progress Notes (Signed)
Subjective:  Patient ID: Amy Black, female    DOB: November 11, 1940  Age: 79 y.o. MRN: [id number]  CC: Medication Refill and Urinary Tract Infection   HPI Amy Black presents for follow-up of her chronic pain based on diagnosis of transverse myelitis.  She has been taking tramadol long-term as a result.  Today she has some right lower abdominal pain near the inguinal crease.  He is also been having some chest pain.  It is episodic.  It is not clearly related to exertion nor does it radiate.  Is also followed for chronic anxiety.  She has been using Xanax for many years.  She has been out for several days and says she has been very jittery and nervous.  She is not sleeping well.  He cannot without the Xanax and tramadol.  PDMP review shows a NARx score of 080.  Her narcotic score is 320 and her sedative score is 410.  She has been filling both prescriptions on a regular basis at 10 morphine milligram equivalents daily for the tramadol and 3 lorazepam milligram equivalents for the alprazolam.  There is no evidence for doctor shopping or overfilling or overusing the medications.  Patient in for follow-up of GERD. Currently asymptomatic taking  PPI daily. There is no heartburn. No hematemesis and no melena. No dysphagia or choking. Onset is remote. Progression is stable. Complicating factors, none.  Follow-up of hypertension. Patient has no history of headacheor shortness of breath or recent cough. Patient also denies symptoms of TIA such as numbness weakness lateralizing. Patient checks  blood pressure at home and has not had any elevated readings recently. Patient denies side effects from his medication. States taking it regularly.    Depression screen [facility] 2/9 10/30/2019 09/16/2019 07/03/2019  Decreased Interest 0 0 0  Down, Depressed, Hopeless 0 0 0  PHQ - 2 Score 0 0 0  Altered sleeping - - -  Tired, decreased energy - - -  Change in appetite - - -  Feeling bad or failure about yourself  - - -   Trouble concentrating - - -  Moving slowly or fidgety/restless - - -  Suicidal thoughts - - -  PHQ-9 Score - - -  Some recent data might be hidden    History Amy Black has a past medical history of Adenocarcinoma ([place]), Adenocarcinoma of unknown primary ([place]) (02/06/2011), Adenosquamous carcinoma, Allergy, Anxiety, CAD (coronary artery disease), Cataract, Coronary artery spasm ([place]), Diverticulosis of colon (without mention of hemorrhage), GERD (gastroesophageal reflux disease), hepatic cyst, Hip fracture, right ([place]), History of cardiac catheterization, History of nuclear stress test (04/2011), HTN (hypertension), Hyperlipidemia, Insomnia, Irritable bowel syndrome, Transverse myelitis ([place]), and Vitamin D deficiency.   She has a past surgical history that includes Resection soft tissue tumor leg / ankle radical (2004); Diagnostic laparoscopy; Lysis of adhesion; Salpingoophorectomy (Left); Pelvic laparoscopy (1992); Cardiac catheterization; transthoracic echocardiogram (07/2012); Cardiac catheterization (N/A, 08/28/2014); Colonoscopy; Upper gastrointestinal endoscopy; Total hip arthroplasty (Right, 10/04/2015); and Abdominal hysterectomy (Bilateral).   Her family history includes Bladder Cancer in her mother; Coronary artery disease in an other family member; Depression in her sister; Diabetes in her brother and another family member; Heart disease in her father and mother; Hyperlipidemia in her sister; Hypertension in her brother and mother; Seizures in her brother; Stroke in her father.She reports that she quit smoking about 46 years ago. Her smoking use included cigarettes. She started smoking about 50 years ago. She has a 2.00 pack-year smoking history. She has never  used smokeless tobacco. She reports that she does not drink alcohol and does not use drugs.    ROS Review of Systems  Constitutional: Negative.   HENT: Negative for congestion.   Eyes: Negative for visual disturbance.  Respiratory:  Negative for shortness of breath.   Cardiovascular: Negative for chest pain.  Gastrointestinal: Negative for abdominal pain (Right lower quadrant to suprapubic region), constipation, diarrhea, nausea and vomiting.  Genitourinary: Positive for frequency. Negative for difficulty urinating.  Musculoskeletal: Negative for arthralgias and myalgias.  Neurological: Negative for headaches.  Psychiatric/Behavioral: Negative for sleep disturbance.    Objective:  BP 127/76   Pulse 79   Temp (!) 96.7 F (35.9 C) (Temporal)   Resp 20   Ht 5\' 3"  (1.6 m)   Wt 151 lb (68.5 kg)   SpO2 98%   BMI 26.75 kg/m   BP Readings from Last 3 Encounters:  10/30/19 127/76  10/29/19 138/69  07/03/19 135/79    Wt Readings from Last 3 Encounters:  10/30/19 151 lb (68.5 kg)  10/29/19 150 lb 4 oz (68.2 kg)  07/03/19 153 lb (69.4 kg)     Physical Exam    Assessment & Plan:   Amy Black was seen today for medication refill and urinary tract infection.  Diagnoses and all orders for this visit:  Transverse myelitis ([place]) -     cyclobenzaprine (FLEXERIL) 5 MG tablet; Take 1 tablet (5 mg total) by mouth 3 (three) times daily as needed for muscle spasms. -     traMADol (ULTRAM) 50 MG tablet; TAKE ONE TABLET BY MOUTH EVERY TWELVE HOURS AS NEEDED.  Urinary frequency -     Urinalysis, Complete -     Urine Culture -     Microscopic Examination  Anxiety -     ALPRAZolam (XANAX) 0.5 MG tablet; Take 1 tablet (0.5 mg total) by mouth 3 (three) times daily.  Exertional angina (H[facility])  Arthritis of neck -     nabumetone (RELAFEN) 500 MG tablet; TAKE ONE TABLET BY MOUTH TWICE DAILY FOR MUSCLE & JOINT PAIN  Essential hypertension -     triamterene-hydrochlorothiazide (DYAZIDE) 37.5-25 MG capsule; TAKE ONE CAPSULE BY MOUTH 3 TIMES PER WEEK -     verapamil (CALAN-SR) 120 MG CR tablet; Take 1 tablet (120 mg total) by mouth at bedtime.   Amy Black is followed by Dr. Percival Spanish of cardiology for her angina..  She  was encouraged to follow-up for this condition particularly if they are exertional and low-grade..   I have discontinued Amy Black. Amy Black's ondansetron and hydrOXYzine. I have also changed her cyclobenzaprine and verapamil. Additionally, I am having her maintain her Calcium Citrate-Vitamin D, aspirin EC, Ferrex 150, loperamide, isosorbide mononitrate, fluticasone, pantoprazole, methocarbamol, nitroGLYCERIN, Fusion Plus, traMADol, triamterene-hydrochlorothiazide, ALPRAZolam, and nabumetone.  Allergies as of 10/30/2019      Reactions   Iodine Itching   Amlodipine Other (See Comments)   headaches   Lexapro [escitalopram Oxalate] Other (See Comments)   Drunk, High feeling   Cymbalta [duloxetine Hcl] Other (See Comments)   sleepiness   Gabapentin Other (See Comments)   sleepiness   Ivp Dye [iodinated Diagnostic Agents] Itching   Zanaflex [tizanidine Hcl] Other (See Comments)   Very drunk feeling next day      Medication List       Accurate as of October 30, 2019 11:59 PM. If you have any questions, ask your nurse or doctor.        STOP taking these medications   hydrOXYzine 25  MG tablet Commonly known as: ATARAX/VISTARIL Stopped by: [name], MD   ondansetron 4 MG disintegrating tablet Commonly known as: Zofran ODT Stopped by: [name], MD     TAKE these medications   ALPRAZolam 0.5 MG tablet Commonly known as: XANAX Take 1 tablet (0.5 mg total) by mouth 3 (three) times daily.   aspirin EC 81 MG tablet Take 81 mg by mouth daily.   Calcium Citrate-Vitamin D 315-250 MG-UNIT Tabs Commonly known as: Calcium Citrate + D3 Take 2 tablets by mouth daily.   cyclobenzaprine 5 MG tablet Commonly known as: FLEXERIL Take 1 tablet (5 mg total) by mouth 3 (three) times daily as needed for muscle spasms. What changed: See the new instructions. Changed by: [name], MD   Ferrex 150 150 MG capsule Generic drug: iron polysaccharides TAKE ONE CAPSULE BY MOUTH ONCE  DAILY   fluticasone 50 MCG/ACT nasal spray Commonly known as: FLONASE Place 2 sprays into both nostrils daily.   Fusion Plus Caps Take 1 capsule by mouth daily.   isosorbide mononitrate 120 MG 24 hr tablet Commonly known as: IMDUR Take 1 tablet (120 mg total) by mouth daily.   loperamide 2 MG capsule Commonly known as: IMODIUM 1 capsule by mouth four times daily PRN   methocarbamol 500 MG tablet Commonly known as: ROBAXIN Take 1 tablet (500 mg total) by mouth 4 (four) times daily.   nabumetone 500 MG tablet Commonly known as: RELAFEN TAKE ONE TABLET BY MOUTH TWICE DAILY FOR MUSCLE & JOINT PAIN   nitroGLYCERIN 0.4 MG SL tablet Commonly known as: NITROSTAT DISSOLVE ONE TABLET UNDER TONGUE EVERY 5 MINUTES UP TO 3 DOSES AS NEEDED FOR CHEST PAIN   pantoprazole 20 MG tablet Commonly known as: PROTONIX TAKE TWO (2) TABLETS BY MOUTH IN THE MORNING AND TAKE ONE TABLET IN THE EVENING   traMADol 50 MG tablet Commonly known as: ULTRAM TAKE ONE TABLET BY MOUTH EVERY TWELVE HOURS AS NEEDED.   triamterene-hydrochlorothiazide 37.5-25 MG capsule Commonly known as: DYAZIDE TAKE ONE CAPSULE BY MOUTH 3 TIMES PER WEEK   verapamil 120 MG CR tablet Commonly known as: CALAN-SR Take 1 tablet (120 mg total) by mouth at bedtime.        Follow-up: Return in about 6 months (around 04/28/2020).  [name], M.D.

## 2019-10-31 ENCOUNTER — Encounter: Payer: Self-pay | Admitting: Family Medicine

## 2019-10-31 MED ORDER — NABUMETONE 500 MG PO TABS: ORAL_TABLET | ORAL | 1 refills | Status: DC

## 2019-10-31 MED ORDER — VERAPAMIL HCL ER 120 MG PO TBCR: 120.0000 mg | EXTENDED_RELEASE_TABLET | Freq: Every day | ORAL | 1 refills | Status: DC

## 2019-11-01 LAB — URINE CULTURE

## 2019-11-04 ENCOUNTER — Other Ambulatory Visit: Payer: Self-pay | Admitting: Family Medicine

## 2019-11-04 MED ORDER — AMOXICILLIN 500 MG PO CAPS: 500.0000 mg | ORAL_CAPSULE | Freq: Three times a day (TID) | ORAL | 0 refills | Status: DC

## 2019-11-04 NOTE — Progress Notes (Signed)
Your culture shows the presence of a germ that is resistant to the current antibiotic you are taking. Please discontinue that medication and take the new one I have sent to your pharmacy.  Best Regards, [name], M.D.

## 2019-11-05 ENCOUNTER — Telehealth: Payer: Self-pay | Admitting: Family Medicine

## 2019-11-10 ENCOUNTER — Telehealth: Payer: Self-pay | Admitting: Cardiology

## 2019-11-10 NOTE — Telephone Encounter (Signed)
Pt c/o of Chest Pain: STAT if CP now or developed within 24 hours  1. Are you having CP right now? no  2. Are you experiencing any other symptoms (ex. SOB, nausea, vomiting, sweating)? nausea  3. How long have you been experiencing CP? Since Saturday   4. Is your CP continuous or coming and going? continuous  5. Have you taken Nitroglycerin? Yes   Patient states she has been having a dull pain in on the right side of her breast. She states she has also been having nausea.    ?

## 2019-11-10 NOTE — Telephone Encounter (Signed)
Spoke with patient. Patient had chest pain last week, last episode Wednesday. She took a nitroglycerin and it eased the pain some but it did not go away fully at that time.Chest pain accompanied by nausea and a "hot flash" that comes and goes. Chest pain has since resolved but on Saturday she felt a "crawling" sensation in her chest and describes it as a discomfort not a pain. On Saturday she had to lay around all day. She reports she is not currently having symptoms. Unable to obtain BP or HR at this time.  Offered APP appointment for tomorrow morning at 8:15 but patient refused, patient scheduled for next available on 12/08/19 at 11:40am. Patient educated to call 911 or report to nearest emergency room if pain starts again. Patient verbalized understanding.

## 2019-11-10 NOTE — Telephone Encounter (Signed)
Agree 

## 2019-11-27 DIAGNOSIS — M25552 Pain in left hip: Secondary | ICD-10-CM | POA: Diagnosis not present

## 2019-12-08 ENCOUNTER — Ambulatory Visit: Payer: Medicare Other | Admitting: Cardiology

## 2019-12-10 NOTE — Progress Notes (Signed)
Cardiology Office Note   Date:  12/11/2019   ID:  Sharlon Pfohl Hollett, DOB [birth year], MRN [id number]  PCP:  [name], MD  Cardiologist:   Minus Breeding, MD   Chief Complaint  Patient presents with  . Palpitations      History of Present Illness: Amy Black is a 79 y.o. female who presents for a follow up of presumed coronary spasm. On 08/28/2014, she had left heart cath that showed minimal CAD, 20% disease in LAD was normal LVEF. He also had history of palpitations and hypertension. She has been on verapamil for greater than 25 years.  She has had recurrent chest pain and has been treated with increased doses of Imdur.  She had relief of her pain after being taken off of verapamil and started on Cardizem.  She continued to have some symptoms as described and subsequently she was changed back to verapamil. She had chest pain when I last saw her and she I increased her Imdur.    Since she was last seen she has had continued intermittent chest pain.  This is probably having a little more frequently.  It is happening more at night.  She has to take 2 rarely 3 nitroglycerin on occasion and thinks that she is doing this a little more frequently than previous.  She cannot bring it on with activity.  She actually feels better with activity.  The discomfort is dull.  It is similar to the symptoms she had in 2016 when she was found to have no coronary disease but was thought to have coronary spasm.  She otherwise gets around fairly well.  She uses a cane.  She has no new shortness of breath, PND or orthopnea.  She has no further palpitations or dizziness which was a complaint previously.   Past Medical History:  Diagnosis Date  . Adenocarcinoma (HCC)    LEFT LEG  . Adenocarcinoma of unknown primary ([place]) 02/06/2011  . 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 Allergy   . Anxiety   . CAD (coronary artery disease)    a. minimal CAD by cath in 2016 with  presumed spasm (20% mLAD, 20% D2).  . Cataract    bilateral cataracts removed  . Coronary artery spasm ([place])   . Diverticulosis of colon (without mention of hemorrhage)   . GERD (gastroesophageal reflux disease)   . hepatic cyst   . Hip fracture, right ([place])   . History of cardiac catheterization   . History of nuclear stress test 04/2011   lexiscan; normal study, no significant ischemia, low risk; 2015 - normal  . HTN (hypertension)    PT. DENIES  . Hyperlipidemia   . Insomnia   . Irritable bowel syndrome   . Transverse myelitis ([place])   . Vitamin D deficiency     Past Surgical History:  Procedure Laterality Date  . ABDOMINAL HYSTERECTOMY Bilateral   . CARDIAC CATHETERIZATION     coronary spasm  . CARDIAC CATHETERIZATION N/A 08/28/2014   Procedure: Left Heart Cath and Coronary Angiography;  Surgeon: Jettie Booze, MD;  Location: [facility];  Service: Cardiovascular;  Laterality: N/A;  . COLONOSCOPY    . DIAGNOSTIC LAPAROSCOPY    . LYSIS OF ADHESION    . PELVIC LAPAROSCOPY  1992   RSO, AND LSO ON 2007  . RESECTION SOFT TISSUE TUMOR LEG / ANKLE RADICAL  2004   leg lesion resection & radiation  .  SALPINGOOPHORECTOMY Left   . TOTAL HIP ARTHROPLASTY Right 10/04/2015   Procedure: TOTAL HIP ARTHROPLASTY ANTERIOR APPROACH;  Surgeon: Rod Can, MD;  Location: [facility];  Service: Orthopedics;  Laterality: Right;  . TRANSTHORACIC ECHOCARDIOGRAM  07/2012   LV cavity size mildly reduced, normal wall motion; MV with calcified annulus and mild MR; LA mildly dilated; atrial septum with increased thickness - lipomatous hypertrophy; RV systolic pressure increased (borderline pulm HTN)  . UPPER GASTROINTESTINAL ENDOSCOPY       Current Outpatient Medications  Medication Sig Dispense Refill  . ALPRAZolam (XANAX) 0.5 MG tablet Take 1 tablet (0.5 mg total) by mouth 3 (three) times daily. 90 tablet 5  . aspirin EC 81 MG tablet Take 81 mg by mouth daily.    . Calcium  Citrate-Vitamin D (CALCIUM CITRATE + D3) 315-250 MG-UNIT TABS Take 2 tablets by mouth daily. 120 tablet   . cyclobenzaprine (FLEXERIL) 5 MG tablet Take 1 tablet (5 mg total) by mouth 3 (three) times daily as needed for muscle spasms. 90 tablet 5  . FERREX 150 150 MG capsule TAKE ONE CAPSULE BY MOUTH ONCE DAILY 100 capsule 0  . Iron-FA-B Cmp-C-Biot-Probiotic (FUSION PLUS) CAPS Take 1 capsule by mouth daily. 30 capsule 6  . isosorbide mononitrate (IMDUR) 120 MG 24 hr tablet Take 2 tablets (240 mg total) by mouth daily. 180 tablet 3  . loperamide (IMODIUM) 2 MG capsule 1 capsule by mouth four times daily PRN 120 capsule 0  . nitroGLYCERIN (NITROSTAT) 0.4 MG SL tablet DISSOLVE ONE TABLET UNDER TONGUE EVERY 5 MINUTES UP TO 3 DOSES AS NEEDED FOR CHEST PAIN 25 tablet 5  . pantoprazole (PROTONIX) 20 MG tablet TAKE TWO (2) TABLETS BY MOUTH IN THE MORNING AND TAKE ONE TABLET IN THE EVENING 90 tablet 7  . traMADol (ULTRAM) 50 MG tablet TAKE ONE TABLET BY MOUTH EVERY TWELVE HOURS AS NEEDED. 60 tablet 5  . triamterene-hydrochlorothiazide (DYAZIDE) 37.5-25 MG capsule TAKE ONE CAPSULE BY MOUTH 3 TIMES PER WEEK 30 capsule 5  . verapamil (CALAN-SR) 120 MG CR tablet Take 1 tablet (120 mg total) by mouth at bedtime. 90 tablet 1  . amoxicillin (AMOXIL) 500 MG capsule Take 1 capsule (500 mg total) by mouth 3 (three) times daily. (Patient not taking: Reported on 12/11/2019) 21 capsule 0  . fluticasone (FLONASE) 50 MCG/ACT nasal spray Place 2 sprays into both nostrils daily. (Patient not taking: Reported on 12/11/2019) 16 g 9  . methocarbamol (ROBAXIN) 500 MG tablet Take 1 tablet (500 mg total) by mouth 4 (four) times daily. (Patient not taking: Reported on 12/11/2019) 120 tablet 2  . nabumetone (RELAFEN) 500 MG tablet TAKE ONE TABLET BY MOUTH TWICE DAILY FOR MUSCLE & JOINT PAIN (Patient not taking: Reported on 12/11/2019) 180 tablet 1   No current facility-administered medications for this visit.    Allergies:   Iodine,  Amlodipine, Lexapro [escitalopram oxalate], Cymbalta [duloxetine hcl], Gabapentin, Ivp dye [iodinated diagnostic agents], and Zanaflex [tizanidine hcl]   ROS:  Please see the history of present illness.   Otherwise, review of systems are positive for none.   All other systems are reviewed and negative.    PHYSICAL EXAM: VS:  BP (!) 145/76   Ht 5\' 3"  (1.6 m)   Wt 155 lb 3.2 oz (70.4 kg)   SpO2 100%   BMI 27.49 kg/m  , BMI Body mass index is 27.49 kg/m. GENERAL:  Well appearing NECK:  No jugular venous distention, waveform within normal limits, carotid upstroke brisk and  symmetric, no bruits, no thyromegaly LUNGS:  Clear to auscultation bilaterally CHEST:  Unremarkable HEART:  PMI not displaced or sustained,S1 and S2 within normal limits, no S3, no S4, no clicks, no rubs, no murmurs ABD:  Flat, positive bowel sounds normal in frequency in pitch, no bruits, no rebound, no guarding, no midline pulsatile mass, no hepatomegaly, no splenomegaly EXT:  2 plus pulses throughout, no edema, no cyanosis no clubbing  EKG:  EKG is ordered today. The ekg ordered today demonstrates sinus rhythm, rate 67, axis within normal limits, intervals within normal limits, no acute ST-T wave changes.   Recent Labs: 10/29/2019: ALT 9; BUN 11; Creatinine 0.83; Hemoglobin 10.9; Platelet Count 388; Potassium 3.9; Sodium 136    Lipid Panel    Component Value Date/Time   CHOL 192 06/18/2018 1637   CHOL 203 (H) 08/29/2012 1316   TRIG 116 06/18/2018 1637   TRIG 131 08/29/2012 1316   HDL 75 06/18/2018 1637   HDL 68 08/29/2012 1316   CHOLHDL 2.6 06/18/2018 1637   LDLCALC 94 06/18/2018 1637   LDLCALC 109 (H) 08/29/2012 1316      Wt Readings from Last 3 Encounters:  12/11/19 155 lb 3.2 oz (70.4 kg)  10/30/19 151 lb (68.5 kg)  10/29/19 150 lb 4 oz (68.2 kg)      Other studies Reviewed: Additional studies/ records that were reviewed today include: Labs. Review of the above records demonstrates:  Please see  elsewhere in the note.     ASSESSMENT AND PLAN:   Coronary Spasm:    She does have some increased chest pain and so I will increase her Imdur to 240 mg daily.  Since more the discomfort is at night she will start taking this at noon rather than early in the morning.  HTN: Her blood pressure is very mildly elevated but we will see how it does with this increase in Imdur.  Palpitations:  She is not bothered by these.  No change in therapy.\ Current medicines are reviewed at length with the patient today.  The patient does not have concerns regarding medicines.  The following changes have been made: As above  Labs/ tests ordered today include: None  Orders Placed This Encounter  Procedures  . EKG 12-Lead     Disposition:   FU with 6 months in [place], Minus Breeding, MD  12/11/2019 11:34 AM    [facility]

## 2019-12-11 ENCOUNTER — Other Ambulatory Visit: Payer: Self-pay

## 2019-12-11 ENCOUNTER — Ambulatory Visit: Payer: Medicare Other | Admitting: Cardiology

## 2019-12-11 ENCOUNTER — Encounter: Payer: Self-pay | Admitting: Cardiology

## 2019-12-11 VITALS — BP 145/76 | Ht 63.0 in | Wt 155.2 lb

## 2019-12-11 DIAGNOSIS — I208 Other forms of angina pectoris: Secondary | ICD-10-CM

## 2019-12-11 DIAGNOSIS — R42 Dizziness and giddiness: Secondary | ICD-10-CM

## 2019-12-11 DIAGNOSIS — R002 Palpitations: Secondary | ICD-10-CM | POA: Diagnosis not present

## 2019-12-11 DIAGNOSIS — I10 Essential (primary) hypertension: Secondary | ICD-10-CM

## 2019-12-11 DIAGNOSIS — M549 Dorsalgia, unspecified: Secondary | ICD-10-CM

## 2019-12-11 DIAGNOSIS — I25111 Atherosclerotic heart disease of native coronary artery with angina pectoris with documented spasm: Secondary | ICD-10-CM | POA: Diagnosis not present

## 2019-12-11 MED ORDER — ISOSORBIDE MONONITRATE ER 120 MG PO TB24: 240.0000 mg | ORAL_TABLET | Freq: Every day | ORAL | 3 refills | Status: DC

## 2019-12-11 NOTE — Patient Instructions (Signed)
Medication Instructions:  INCREASE you Imdur to 240 mg once a day around noon *If you need a refill on your cardiac medications before your next appointment, please call your pharmacy*   Lab Work: None ordered If you have labs (blood work) drawn today and your tests are completely normal, you will receive your results only by: Marland Kitchen MyChart Message (if you have MyChart) OR . A paper copy in the mail If you have any lab test that is abnormal or we need to change your treatment, we will call you to review the results.   Testing/Procedures: None ordered   Follow-Up: At [facility], you and your health needs are our priority.  As part of our continuing mission to provide you with exceptional heart care, we have created designated Provider Care Teams.  These Care Teams include your primary Cardiologist (physician) and Advanced Practice Providers (APPs -  Physician Assistants and Nurse Practitioners) who all work together to provide you with the care you need, when you need it.  We recommend signing up for the patient portal called "MyChart".  Sign up information is provided on this After Visit Summary.  MyChart is used to connect with patients for Virtual Visits (Telemedicine).  Patients are able to view lab/test results, encounter notes, upcoming appointments, etc.  Non-urgent messages can be sent to your provider as well.   To learn more about what you can do with MyChart, go to [email or web address].    Your next appointment:   6 month(s) in the [facility]  The format for your next appointment:   In Person  Provider:   Dr. Minus Breeding, MD   Other Instructions None

## 2019-12-29 ENCOUNTER — Inpatient Hospital Stay (HOSPITAL_BASED_OUTPATIENT_CLINIC_OR_DEPARTMENT_OTHER): Payer: Medicare Other | Admitting: Hematology & Oncology

## 2019-12-29 ENCOUNTER — Other Ambulatory Visit: Payer: Self-pay

## 2019-12-29 ENCOUNTER — Inpatient Hospital Stay: Payer: Medicare Other | Attending: Family

## 2019-12-29 ENCOUNTER — Encounter: Payer: Self-pay | Admitting: Hematology & Oncology

## 2019-12-29 ENCOUNTER — Telehealth: Payer: Self-pay

## 2019-12-29 VITALS — BP 162/80 | HR 58 | Temp 97.8°F | Resp 20 | Wt 152.8 lb

## 2019-12-29 DIAGNOSIS — Z859 Personal history of malignant neoplasm, unspecified: Secondary | ICD-10-CM | POA: Diagnosis not present

## 2019-12-29 DIAGNOSIS — C801 Malignant (primary) neoplasm, unspecified: Secondary | ICD-10-CM

## 2019-12-29 DIAGNOSIS — Z862 Personal history of diseases of the blood and blood-forming organs and certain disorders involving the immune mechanism: Secondary | ICD-10-CM | POA: Diagnosis not present

## 2019-12-29 DIAGNOSIS — D649 Anemia, unspecified: Secondary | ICD-10-CM | POA: Insufficient documentation

## 2019-12-29 DIAGNOSIS — D75839 Thrombocytosis, unspecified: Secondary | ICD-10-CM | POA: Diagnosis not present

## 2019-12-29 DIAGNOSIS — D500 Iron deficiency anemia secondary to blood loss (chronic): Secondary | ICD-10-CM

## 2019-12-29 LAB — CBC WITH DIFFERENTIAL (CANCER CENTER ONLY)
Abs Immature Granulocytes: 0.01 10*3/uL (ref 0.00–0.07)
Basophils Absolute: 0.1 10*3/uL (ref 0.0–0.1)
Basophils Relative: 1 %
Eosinophils Absolute: 0.7 10*3/uL — ABNORMAL HIGH (ref 0.0–0.5)
Eosinophils Relative: 13 %
HCT: 34.6 % — ABNORMAL LOW (ref 36.0–46.0)
Hemoglobin: 11.1 g/dL — ABNORMAL LOW (ref 12.0–15.0)
Immature Granulocytes: 0 %
Lymphocytes Relative: 31 %
Lymphs Abs: 1.8 10*3/uL (ref 0.7–4.0)
MCH: 31.4 pg (ref 26.0–34.0)
MCHC: 32.1 g/dL (ref 30.0–36.0)
MCV: 98 fL (ref 80.0–100.0)
Monocytes Absolute: 0.4 10*3/uL (ref 0.1–1.0)
Monocytes Relative: 8 %
Neutro Abs: 2.8 10*3/uL (ref 1.7–7.7)
Neutrophils Relative %: 47 %
Platelet Count: 397 10*3/uL (ref 150–400)
RBC: 3.53 MIL/uL — ABNORMAL LOW (ref 3.87–5.11)
RDW: 13.1 % (ref 11.5–15.5)
WBC Count: 5.8 10*3/uL (ref 4.0–10.5)
nRBC: 0 % (ref 0.0–0.2)

## 2019-12-29 LAB — RETICULOCYTES
Immature Retic Fract: 10.7 % (ref 2.3–15.9)
RBC.: 3.52 MIL/uL — ABNORMAL LOW (ref 3.87–5.11)
Retic Count, Absolute: 69.7 10*3/uL (ref 19.0–186.0)
Retic Ct Pct: 2 % (ref 0.4–3.1)

## 2019-12-29 LAB — CMP (CANCER CENTER ONLY)
ALT: 11 U/L (ref 0–44)
AST: 15 U/L (ref 15–41)
Albumin: 4.4 g/dL (ref 3.5–5.0)
Alkaline Phosphatase: 80 U/L (ref 38–126)
Anion gap: 5 (ref 5–15)
BUN: 12 mg/dL (ref 8–23)
CO2: 33 mmol/L — ABNORMAL HIGH (ref 22–32)
Calcium: 10.1 mg/dL (ref 8.9–10.3)
Chloride: 102 mmol/L (ref 98–111)
Creatinine: 0.84 mg/dL (ref 0.44–1.00)
GFR, Estimated: >60 mL/min (ref >60)
Glucose, Bld: 86 mg/dL (ref 70–99)
Potassium: 4.6 mmol/L (ref 3.5–5.1)
Sodium: 140 mmol/L (ref 135–145)
Total Bilirubin: 0.3 mg/dL (ref 0.3–1.2)
Total Protein: 7.6 g/dL (ref 6.5–8.1)

## 2019-12-29 NOTE — Progress Notes (Signed)
Hematology and Oncology Follow Up Visit  Amy Black [id number] 02-12-1940 79 y.o. 12/29/2019   Principle Diagnosis:  Transient thrombocytosis History transverse myelitis History of adenosquamous carcinoma of unknown primary Iron def anemia  Current Therapy:   Fusion Plus -- daily   Interim History:  [name] is here today for follow-up.  She is a little worried about her blood pressure.  It was on the high side today.  We went ahead and rechecked it at the end of our appointment.  The blood pressure was still high at 170/90.  I told her to see her family doctor.  She is on 3 different blood pressure medications.  She has had no other problems.  She feels okay otherwise.  She has had no problems with nausea or vomiting.  She has had no cough or shortness of breath.  There is no change in bowel or bladder habits.  I am little bit worried about her anemia.  When we last saw her back in September, her ferritin was 141 with an iron saturation of 43%.  Back in March her iron saturation was only 20%.  She is taking some oral iron and having no problems with this.  She has had no problems with bleeding.  There is no fever.  She has had no rashes.  There is no leg swelling.   Overall, her performance status is ECOG 1.   Medications:  Allergies as of 12/29/2019      Reactions   Iodine Itching   Amlodipine Other (See Comments)   headaches   Lexapro [escitalopram Oxalate] Other (See Comments)   Drunk, High feeling   Cymbalta [duloxetine Hcl] Other (See Comments)   sleepiness   Gabapentin Other (See Comments)   sleepiness   Ivp Dye [iodinated Diagnostic Agents] Itching   Zanaflex [tizanidine Hcl] Other (See Comments)   Very drunk feeling next day      Medication List       Accurate as of December 29, 2019  3:21 PM. If you have any questions, ask your nurse or doctor.        STOP taking these medications   amoxicillin 500 MG capsule Commonly known as: AMOXIL Stopped  by: Volanda Napoleon, MD   aspirin EC 81 MG tablet Stopped by: Volanda Napoleon, MD   Ferrex 150 150 MG capsule Generic drug: iron polysaccharides Stopped by: Volanda Napoleon, MD     TAKE these medications   ALPRAZolam 0.5 MG tablet Commonly known as: XANAX Take 1 tablet (0.5 mg total) by mouth 3 (three) times daily.   Calcium Citrate-Vitamin D 315-250 MG-UNIT Tabs Commonly known as: Calcium Citrate + D3 Take 2 tablets by mouth daily.   cyclobenzaprine 5 MG tablet Commonly known as: FLEXERIL Take 1 tablet (5 mg total) by mouth 3 (three) times daily as needed for muscle spasms.   fluticasone 50 MCG/ACT nasal spray Commonly known as: FLONASE Place 2 sprays into both nostrils daily.   Fusion Plus Caps Take 1 capsule by mouth daily.   isosorbide mononitrate 120 MG 24 hr tablet Commonly known as: IMDUR Take 2 tablets (240 mg total) by mouth daily.   loperamide 2 MG capsule Commonly known as: IMODIUM 1 capsule by mouth four times daily PRN   methocarbamol 500 MG tablet Commonly known as: ROBAXIN Take 1 tablet (500 mg total) by mouth 4 (four) times daily.   nabumetone 500 MG tablet Commonly known as: RELAFEN TAKE ONE TABLET BY MOUTH TWICE DAILY FOR  MUSCLE & JOINT PAIN   nitroGLYCERIN 0.4 MG SL tablet Commonly known as: NITROSTAT DISSOLVE ONE TABLET UNDER TONGUE EVERY 5 MINUTES UP TO 3 DOSES AS NEEDED FOR CHEST PAIN   pantoprazole 20 MG tablet Commonly known as: PROTONIX TAKE TWO (2) TABLETS BY MOUTH IN THE MORNING AND TAKE ONE TABLET IN THE EVENING What changed: See the new instructions.   traMADol 50 MG tablet Commonly known as: ULTRAM TAKE ONE TABLET BY MOUTH EVERY TWELVE HOURS AS NEEDED.   triamterene-hydrochlorothiazide 37.5-25 MG capsule Commonly known as: DYAZIDE TAKE ONE CAPSULE BY MOUTH 3 TIMES PER WEEK   verapamil 120 MG CR tablet Commonly known as: CALAN-SR Take 1 tablet (120 mg total) by mouth at bedtime.       Allergies:  Allergies  Allergen  Reactions  . Iodine Itching  . Amlodipine Other (See Comments)    headaches  . Lexapro [Escitalopram Oxalate] Other (See Comments)    Drunk, High feeling  . Cymbalta [Duloxetine Hcl] Other (See Comments)    sleepiness  . Gabapentin Other (See Comments)    sleepiness  . Ivp Dye [Iodinated Diagnostic Agents] Itching  . Zanaflex [Tizanidine Hcl] Other (See Comments)    Very drunk feeling next day    Past Medical History, Surgical history, Social history, and Family History were reviewed and updated.  Review of Systems: Review of Systems  Constitutional: Negative.   HENT: Negative.   Eyes: Negative.   Respiratory: Negative.   Cardiovascular: Negative.   Gastrointestinal: Negative.   Genitourinary: Negative.   Musculoskeletal: Negative.   Skin: Negative.   Neurological: Negative.   Endo/Heme/Allergies: Negative.   Psychiatric/Behavioral: Negative.       Physical Exam:  weight is 152 lb 12.8 oz (69.3 kg). Her oral temperature is 97.8 F (36.6 C). Her blood pressure is 162/80 (abnormal) and her pulse is 58 (abnormal). Her respiration is 20 and oxygen saturation is 100%.   Wt Readings from Last 3 Encounters:  12/29/19 152 lb 12.8 oz (69.3 kg)  12/11/19 155 lb 3.2 oz (70.4 kg)  10/30/19 151 lb (68.5 kg)    Physical Exam Vitals reviewed.  HENT:     Head: Normocephalic and atraumatic.  Eyes:     Pupils: Pupils are equal, round, and reactive to light.  Cardiovascular:     Rate and Rhythm: Normal rate and regular rhythm.     Heart sounds: Normal heart sounds.  Pulmonary:     Effort: Pulmonary effort is normal.     Breath sounds: Normal breath sounds.  Abdominal:     General: Bowel sounds are normal.     Palpations: Abdomen is soft.  Musculoskeletal:        General: No tenderness or deformity. Normal range of motion.     Cervical back: Normal range of motion.  Lymphadenopathy:     Cervical: No cervical adenopathy.  Skin:    General: Skin is warm and dry.      Findings: No erythema or rash.  Neurological:     Mental Status: She is alert and oriented to person, place, and time.  Psychiatric:        Behavior: Behavior normal.        Thought Content: Thought content normal.        Judgment: Judgment normal.      Lab Results  Component Value Date   WBC 5.8 12/29/2019   HGB 11.1 (L) 12/29/2019   HCT 34.6 (L) 12/29/2019   MCV 98.0 12/29/2019   PLT 397  12/29/2019   Lab Results  Component Value Date   FERRITIN 141 10/29/2019   IRON 107 10/29/2019   TIBC 246 10/29/2019   UIBC 140 10/29/2019   IRONPCTSAT 43 10/29/2019   Lab Results  Component Value Date   RETICCTPCT 2.0 12/29/2019   RBC 3.53 (L) 12/29/2019   RBC 3.52 (L) 12/29/2019   No results found for: KPAFRELGTCHN, LAMBDASER, KAPLAMBRATIO No results found for: IGGSERUM, IGA, IGMSERUM No results found for: Odetta Pink, SPEI   Chemistry      Component Value Date/Time   NA 140 12/29/2019 1405   NA 139 01/27/2019 1053   NA 138 01/28/2016 1303   K 4.6 12/29/2019 1405   K 4.2 07/28/2016 1243   K 4.2 01/28/2016 1303   CL 102 12/29/2019 1405   CL 98 07/28/2016 1243   CL 101 07/20/2014 1131   CO2 33 (H) 12/29/2019 1405   CO2 29 07/28/2016 1243   CO2 30 (H) 01/28/2016 1303   BUN 12 12/29/2019 1405   BUN 15 01/27/2019 1053   BUN 10.0 01/28/2016 1303   CREATININE 0.84 12/29/2019 1405   CREATININE 0.94 07/28/2016 1243   CREATININE 0.8 01/28/2016 1303      Component Value Date/Time   CALCIUM 10.1 12/29/2019 1405   CALCIUM 9.9 07/28/2016 1243   CALCIUM 10.0 01/28/2016 1303   ALKPHOS 80 12/29/2019 1405   ALKPHOS 105 07/28/2016 1243   ALKPHOS 123 01/28/2016 1303   AST 15 12/29/2019 1405   AST 13 01/28/2016 1303   ALT 11 12/29/2019 1405   ALT 12 01/28/2016 1303   BILITOT 0.3 12/29/2019 1405   BILITOT 0.51 01/28/2016 1303       Impression and Plan: Ms. Boxx is a very pleasant 79yo African American female with history  of transient thrombocytosis and also history of adenosquamous carcinoma with unknown primary.   So far, there is no evidence of recurrence of the adenosquamous carcinoma.  This was in her leg.  She had surgery and radiation for this.  This had to be about 16 years ago.  For right now, her anemia is pretty stable.  As such, we will just watch this.  She will stay on the oral iron.  She will talk to her family doctor about the blood pressure issues.  We will see her back in 3 months now.  Hopefully if her blood counts are better in 3 months, then we will move her out to every 56-month follow-up.        Volanda Napoleon, MD 11/22/20213:21 PM

## 2019-12-29 NOTE — Telephone Encounter (Signed)
appts made and printed for pt per 12/29/19 los      AOM

## 2019-12-30 ENCOUNTER — Encounter: Payer: Self-pay | Admitting: Family

## 2019-12-30 ENCOUNTER — Ambulatory Visit (INDEPENDENT_AMBULATORY_CARE_PROVIDER_SITE_OTHER): Payer: Medicare Other | Admitting: Family

## 2019-12-30 DIAGNOSIS — R350 Frequency of micturition: Secondary | ICD-10-CM

## 2019-12-30 LAB — URINALYSIS, COMPLETE
Bilirubin, UA: NEGATIVE
Glucose, UA: NEGATIVE
Ketones, UA: NEGATIVE
Leukocytes,UA: NEGATIVE
Nitrite, UA: NEGATIVE
Protein,UA: NEGATIVE
RBC, UA: NEGATIVE
Specific Gravity, UA: 1.015 (ref 1.005–1.030)
Urobilinogen, Ur: 0.2 mg/dL (ref 0.2–1.0)
pH, UA: 7 (ref 5.0–7.5)

## 2019-12-30 LAB — MICROSCOPIC EXAMINATION

## 2019-12-30 LAB — IRON AND TIBC
Iron: 70 ug/dL (ref 41–142)
Saturation Ratios: 29 % (ref 21–57)
TIBC: 243 ug/dL (ref 236–444)
UIBC: 172 ug/dL (ref 120–384)

## 2019-12-30 LAB — FERRITIN: Ferritin: 120 ng/mL (ref 11–307)

## 2019-12-30 NOTE — Progress Notes (Signed)
   Virtual Visit via telephone Note Due to COVID-19 pandemic this visit was conducted virtually. This visit type was conducted due to national recommendations for restrictions regarding the COVID-19 Pandemic (e.g. social distancing, sheltering in place) in an effort to limit this patient's exposure and mitigate transmission in our community. All issues noted in this document were discussed and addressed.  A physical exam was not performed with this format.  I connected with Amy Black on 12/30/19 at 11:07 AM by telephone and verified that I am speaking with the correct person using two identifiers. Amy Black is currently located at home and no one is currently with her during visit. The provider, Evelina Dun, FNP is located in their office at time of visit.  I discussed the limitations, risks, security and privacy concerns of performing an evaluation and management service by telephone and the availability of in person appointments. I also discussed with the patient that there may be a patient responsible charge related to this service. The patient expressed understanding and agreed to proceed.   History and Present Illness:  Urinary Frequency  This is a new problem. The current episode started in the past 7 days. The problem has been unchanged. The pain is at a severity of 0/10. The patient is experiencing no pain. Associated symptoms include frequency. Pertinent negatives include no flank pain, hematuria, hesitancy, nausea or urgency. Associated symptoms comments: Dark golden color. She has tried increased fluids for the symptoms. The treatment provided mild relief.      Review of Systems  Gastrointestinal: Negative for nausea.  Genitourinary: Positive for frequency. Negative for flank pain, hematuria, hesitancy and urgency.  All other systems reviewed and are negative.    Observations/Objective: No SOB or distress noted   Assessment and Plan: 1. Urinary frequency Pt  will come and bring urine sample today to rule out UTI. Denies any dysuria or hesitancy, but states she is having frequency and changes in the color of her urine and wants to rule out UTI. Force fluids - Urinalysis, Complete; Future - Urine Culture; Future     I discussed the assessment and treatment plan with the patient. The patient was provided an opportunity to ask questions and all were answered. The patient agreed with the plan and demonstrated an understanding of the instructions.   The patient was advised to call back or seek an in-person evaluation if the symptoms worsen or if the condition fails to improve as anticipated.  The above assessment and management plan was discussed with the patient. The patient verbalized understanding of and has agreed to the management plan. Patient is aware to call the clinic if symptoms persist or worsen. Patient is aware when to return to the clinic for a follow-up visit. Patient educated on when it is appropriate to go to the emergency department.   Time call ended:  11:18 AM  I provided 11 minutes of non-face-to-face time during this encounter.    Evelina Dun, FNP

## 2019-12-30 NOTE — Addendum Note (Signed)
Addended by: Liliane Bade on: 12/30/2019 02:11 PM   Modules accepted: Orders

## 2020-01-02 LAB — URINE CULTURE

## 2020-01-05 ENCOUNTER — Other Ambulatory Visit: Payer: Self-pay | Admitting: Family

## 2020-01-05 ENCOUNTER — Ambulatory Visit: Payer: Medicare Other | Admitting: Family Medicine

## 2020-01-05 MED ORDER — AMOXICILLIN-POT CLAVULANATE 875-125 MG PO TABS: 1.0000 | ORAL_TABLET | Freq: Two times a day (BID) | ORAL | 0 refills | Status: DC

## 2020-02-02 ENCOUNTER — Encounter: Payer: Self-pay | Admitting: Family Medicine

## 2020-02-02 ENCOUNTER — Ambulatory Visit (INDEPENDENT_AMBULATORY_CARE_PROVIDER_SITE_OTHER): Payer: Medicare Other | Admitting: Family Medicine

## 2020-02-02 DIAGNOSIS — R509 Fever, unspecified: Secondary | ICD-10-CM | POA: Diagnosis not present

## 2020-02-02 DIAGNOSIS — R059 Cough, unspecified: Secondary | ICD-10-CM | POA: Diagnosis not present

## 2020-02-02 DIAGNOSIS — J0100 Acute maxillary sinusitis, unspecified: Secondary | ICD-10-CM | POA: Diagnosis not present

## 2020-02-02 DIAGNOSIS — J029 Acute pharyngitis, unspecified: Secondary | ICD-10-CM | POA: Diagnosis not present

## 2020-02-02 DIAGNOSIS — R0981 Nasal congestion: Secondary | ICD-10-CM | POA: Diagnosis not present

## 2020-02-02 DIAGNOSIS — Z008 Encounter for other general examination: Secondary | ICD-10-CM

## 2020-02-02 NOTE — Progress Notes (Signed)
No answer after multiple calls.  WS

## 2020-02-09 ENCOUNTER — Ambulatory Visit (INDEPENDENT_AMBULATORY_CARE_PROVIDER_SITE_OTHER): Payer: Medicare Other | Admitting: Family Medicine

## 2020-02-09 ENCOUNTER — Encounter: Payer: Self-pay | Admitting: Family Medicine

## 2020-02-09 DIAGNOSIS — J40 Bronchitis, not specified as acute or chronic: Secondary | ICD-10-CM | POA: Diagnosis not present

## 2020-02-09 DIAGNOSIS — J329 Chronic sinusitis, unspecified: Secondary | ICD-10-CM | POA: Diagnosis not present

## 2020-02-09 MED ORDER — LEVOFLOXACIN 500 MG PO TABS: 500.0000 mg | ORAL_TABLET | Freq: Every day | ORAL | 0 refills | Status: DC

## 2020-02-09 NOTE — Progress Notes (Signed)
Subjective:    Patient ID: Amy Black, female    DOB: 03/22/1940, 80 y.o.   MRN: [id number]   HPI: Amy Black is a 80 y.o. female presenting for sore on both sides. Cough and congestion. Some green sputum started today. Right lung is burning. Denies fever. Onset 12/27. Has been taking amoxicillin. Dyspnea present. Can go from bedroom to kitchen without dyspnea.    Depression screen [facility] 2/9 10/30/2019 09/16/2019 07/03/2019 04/03/2019 09/25/2018  Decreased Interest 0 0 0 0 0  Down, Depressed, Hopeless 0 0 0 0 0  PHQ - 2 Score 0 0 0 0 0  Altered sleeping - - - - -  Tired, decreased energy - - - - -  Change in appetite - - - - -  Feeling bad or failure about yourself  - - - - -  Trouble concentrating - - - - -  Moving slowly or fidgety/restless - - - - -  Suicidal thoughts - - - - -  PHQ-9 Score - - - - -  Some recent data might be hidden     Relevant past medical, surgical, family and social history reviewed and updated as indicated.  Interim medical history since our last visit reviewed. Allergies and medications reviewed and updated.  ROS:  Review of Systems  Constitutional: Positive for activity change and appetite change. Negative for chills and fever.  HENT: Positive for congestion, postnasal drip, rhinorrhea and sinus pressure. Negative for ear discharge, ear pain, hearing loss, nosebleeds, sneezing and trouble swallowing.   Respiratory: Positive for cough and shortness of breath. Negative for chest tightness.   Cardiovascular: Negative for chest pain and palpitations.  Skin: Negative for rash.     Social History   Tobacco Use  Smoking Status Former Smoker  . Packs/day: 0.50  . Years: 4.00  . Pack years: 2.00  . Types: Cigarettes  . Start date: 12/07/1968  . Quit date: 11/20/1972  . Years since quitting: 35.2  Smokeless Tobacco Never Used  Tobacco Comment   quit 40 years ago       Objective:     Wt Readings from Last 3 Encounters:  12/29/19 152  lb 12.8 oz (69.3 kg)  12/11/19 155 lb 3.2 oz (70.4 kg)  10/30/19 151 lb (68.5 kg)     Exam deferred. Pt. Harboring due to COVID 19. Phone visit performed.   Assessment & Plan:   1. Sinobronchitis     Meds ordered this encounter  Medications  . levofloxacin (LEVAQUIN) 500 MG tablet    Sig: Take 1 tablet (500 mg total) by mouth daily.    Dispense:  7 tablet    Refill:  0    No orders of the defined types were placed in this encounter.     Diagnoses and all orders for this visit:  Sinobronchitis  Other orders -     levofloxacin (LEVAQUIN) 500 MG tablet; Take 1 tablet (500 mg total) by mouth daily.    Virtual Visit via telephone Note  I discussed the limitations, risks, security and privacy concerns of performing an evaluation and management service by telephone and the availability of in person appointments. The patient was identified with two identifiers. Pt.expressed understanding and agreed to proceed. Pt. Is at home. Dr. Darlyn Read is in his office.  Follow Up Instructions:   I discussed the assessment and treatment plan with the patient. The patient was provided an opportunity to ask questions and all were answered. The patient agreed  with the plan and demonstrated an understanding of the instructions.   The patient was advised to call back or seek an in-person evaluation if the symptoms worsen or if the condition fails to improve as anticipated.   Total minutes including chart review and phone contact time: 16   Follow up plan: Return if symptoms worsen or fail to improve.  Amy Fraise, MD [place]

## 2020-02-10 ENCOUNTER — Ambulatory Visit: Payer: Medicare Other

## 2020-02-16 ENCOUNTER — Ambulatory Visit (INDEPENDENT_AMBULATORY_CARE_PROVIDER_SITE_OTHER): Payer: Medicare Other | Admitting: Nurse Practitioner

## 2020-02-16 ENCOUNTER — Other Ambulatory Visit: Payer: Self-pay

## 2020-02-16 ENCOUNTER — Encounter: Payer: Self-pay | Admitting: Nurse Practitioner

## 2020-02-16 VITALS — BP 104/60 | HR 80 | Temp 97.8°F | Ht 63.0 in | Wt 145.2 lb

## 2020-02-16 DIAGNOSIS — B028 Zoster with other complications: Secondary | ICD-10-CM | POA: Diagnosis not present

## 2020-02-16 DIAGNOSIS — L308 Other specified dermatitis: Secondary | ICD-10-CM | POA: Diagnosis not present

## 2020-02-16 MED ORDER — VALACYCLOVIR HCL 1 G PO TABS
1000.0000 mg | ORAL_TABLET | Freq: Two times a day (BID) | ORAL | 0 refills | Status: DC
Start: 2020-02-16 — End: 2020-06-09

## 2020-02-16 NOTE — Assessment & Plan Note (Signed)
Pain not well controlled.  Started patient on Valtrex 2000 mg tablet twice daily for 7 days. Follow-up with worsening hours of symptoms Rx sent to pharmacy.

## 2020-02-16 NOTE — Progress Notes (Signed)
Acute Office Visit  Subjective:    Patient ID: Amy Black, female    DOB: Nov 22, 1940, 80 y.o.   MRN: [id number]  Chief Complaint  Patient presents with  . Tingling    Patient states she had it for a week but then stopped yesterday.     HPI Patient is in today for hyper zoster on right lateral scalp.  Patient report symptoms started about a week ago.  Patient reports this is not new.  Patient is treated with acyclovir whenever she experiences shingles outbreak with pain.  Patient is not reporting fever, headache, nausea or vomiting.   Past Medical History:  Diagnosis Date  . Adenocarcinoma (HCC)    LEFT LEG  . Adenocarcinoma of unknown primary ([place]) 02/06/2011  . 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 Allergy   . Anxiety   . CAD (coronary artery disease)    a. minimal CAD by cath in 2016 with presumed spasm (20% mLAD, 20% D2).  . Cataract    bilateral cataracts removed  . Coronary artery spasm ([place])   . Diverticulosis of colon (without mention of hemorrhage)   . GERD (gastroesophageal reflux disease)   . hepatic cyst   . Hip fracture, right ([place])   . History of cardiac catheterization   . History of nuclear stress test 04/2011   lexiscan; normal study, no significant ischemia, low risk; 2015 - normal  . HTN (hypertension)    PT. DENIES  . Hyperlipidemia   . Insomnia   . Irritable bowel syndrome   . Transverse myelitis ([place])   . Vitamin D deficiency     Past Surgical History:  Procedure Laterality Date  . ABDOMINAL HYSTERECTOMY Bilateral   . CARDIAC CATHETERIZATION     coronary spasm  . CARDIAC CATHETERIZATION N/A 08/28/2014   Procedure: Left Heart Cath and Coronary Angiography;  Surgeon: Jettie Booze, MD;  Location: [facility];  Service: Cardiovascular;  Laterality: N/A;  . COLONOSCOPY    . DIAGNOSTIC LAPAROSCOPY    . LYSIS OF ADHESION    . PELVIC LAPAROSCOPY  1992   RSO, AND LSO ON 2007  . RESECTION SOFT  TISSUE TUMOR LEG / ANKLE RADICAL  2004   leg lesion resection & radiation  . SALPINGOOPHORECTOMY Left   . TOTAL HIP ARTHROPLASTY Right 10/04/2015   Procedure: TOTAL HIP ARTHROPLASTY ANTERIOR APPROACH;  Surgeon: Rod Can, MD;  Location: [facility];  Service: Orthopedics;  Laterality: Right;  . TRANSTHORACIC ECHOCARDIOGRAM  07/2012   LV cavity size mildly reduced, normal wall motion; MV with calcified annulus and mild MR; LA mildly dilated; atrial septum with increased thickness - lipomatous hypertrophy; RV systolic pressure increased (borderline pulm HTN)  . UPPER GASTROINTESTINAL ENDOSCOPY      Family History  Problem Relation Age of Onset  . Bladder Cancer Mother   . Heart disease Mother   . Hypertension Mother   . Heart disease Father   . Stroke Father   . Diabetes Other        Multiple family members on both sides   . Coronary artery disease Other        Multiple family members on both sides   . Hyperlipidemia Sister   . Depression Sister   . Diabetes Brother   . Seizures Brother   . Hypertension Brother   . Colon cancer Neg Hx   . Esophageal cancer Neg Hx   . Pancreatic cancer Neg Hx   .  Rectal cancer Neg Hx   . Stomach cancer Neg Hx     Social History   Socioeconomic History  . Marital status: Married    Spouse name: Augustin Coupe  . Number of children: 2  . Years of education: 82  . Highest education level: High school graduate  Occupational History  . Occupation: Disabilty  . Occupation: DISABILITY    Employer: UNEMPLOYED  Tobacco Use  . Smoking status: Former Smoker    Packs/day: 0.50    Years: 4.00    Pack years: 2.00    Types: Cigarettes    Start date: 12/07/1968    Quit date: 11/20/1972    Years since quitting: 47.2  . Smokeless tobacco: Never Used  . Tobacco comment: quit 40 years ago  Vaping Use  . Vaping Use: Never used  Substance and Sexual Activity  . Alcohol use: No    Alcohol/week: 0.0 standard drinks  . Drug use: No  . Sexual activity: Not  Currently    Birth control/protection: Surgical  Other Topics Concern  . Not on file  Social History Narrative   Disabled, lives with husband and daughter.  Enjoys shopping.    Social Determinants of Health   Financial Resource Strain: Not on file  Food Insecurity: Not on file  Transportation Needs: Not on file  Physical Activity: Not on file  Stress: Not on file  Social Connections: Not on file  Intimate Partner Violence: Not on file    Outpatient Medications Prior to Visit  Medication Sig Dispense Refill  . ALPRAZolam (XANAX) 0.5 MG tablet Take 1 tablet (0.5 mg total) by mouth 3 (three) times daily. 90 tablet 5  . Calcium Citrate-Vitamin D (CALCIUM CITRATE + D3) 315-250 MG-UNIT TABS Take 2 tablets by mouth daily. 120 tablet   . fluticasone (FLONASE) 50 MCG/ACT nasal spray Place 2 sprays into both nostrils daily. 16 g 9  . Iron-FA-B Cmp-C-Biot-Probiotic (FUSION PLUS) CAPS Take 1 capsule by mouth daily. 30 capsule 6  . isosorbide mononitrate (IMDUR) 120 MG 24 hr tablet Take 2 tablets (240 mg total) by mouth daily. 180 tablet 3  . loperamide (IMODIUM) 2 MG capsule 1 capsule by mouth four times daily PRN 120 capsule 0  . methocarbamol (ROBAXIN) 500 MG tablet Take 1 tablet (500 mg total) by mouth 4 (four) times daily. 120 tablet 2  . nabumetone (RELAFEN) 500 MG tablet TAKE ONE TABLET BY MOUTH TWICE DAILY FOR MUSCLE & JOINT PAIN 180 tablet 1  . nitroGLYCERIN (NITROSTAT) 0.4 MG SL tablet DISSOLVE ONE TABLET UNDER TONGUE EVERY 5 MINUTES UP TO 3 DOSES AS NEEDED FOR CHEST PAIN 25 tablet 5  . pantoprazole (PROTONIX) 20 MG tablet TAKE TWO (2) TABLETS BY MOUTH IN THE MORNING AND TAKE ONE TABLET IN THE EVENING (Patient taking differently: 20 mg daily.) 90 tablet 7  . traMADol (ULTRAM) 50 MG tablet TAKE ONE TABLET BY MOUTH EVERY TWELVE HOURS AS NEEDED. 60 tablet 5  . triamterene-hydrochlorothiazide (DYAZIDE) 37.5-25 MG capsule TAKE ONE CAPSULE BY MOUTH 3 TIMES PER WEEK 30 capsule 5  . verapamil  (CALAN-SR) 120 MG CR tablet Take 1 tablet (120 mg total) by mouth at bedtime. 90 tablet 1  . amoxicillin-clavulanate (AUGMENTIN) 875-125 MG tablet Take 1 tablet by mouth 2 (two) times daily. 14 tablet 0  . cyclobenzaprine (FLEXERIL) 5 MG tablet Take 1 tablet (5 mg total) by mouth 3 (three) times daily as needed for muscle spasms. 90 tablet 5  . levofloxacin (LEVAQUIN) 500 MG tablet Take 1  tablet (500 mg total) by mouth daily. 7 tablet 0   No facility-administered medications prior to visit.    Allergies  Allergen Reactions  . Iodine Itching  . Amlodipine Other (See Comments)    headaches  . Lexapro [Escitalopram Oxalate] Other (See Comments)    Drunk, High feeling  . Cymbalta [Duloxetine Hcl] Other (See Comments)    sleepiness  . Gabapentin Other (See Comments)    sleepiness  . Ivp Dye [Iodinated Diagnostic Agents] Itching  . Zanaflex [Tizanidine Hcl] Other (See Comments)    Very drunk feeling next day    Review of Systems  HENT: Negative.   Respiratory: Negative.   Cardiovascular: Negative.   Gastrointestinal: Negative.   Musculoskeletal: Negative.   Skin:       Tenderness on scalp  Neurological: Negative for light-headedness and headaches.  Psychiatric/Behavioral: Negative.   All other systems reviewed and are negative.      Objective:    Physical Exam Vitals reviewed.  Constitutional:      Appearance: Normal appearance.  HENT:     Head: Normocephalic.     Nose: Nose normal.  Eyes:     Conjunctiva/sclera: Conjunctivae normal.  Cardiovascular:     Pulses: Normal pulses.     Heart sounds: Normal heart sounds.  Pulmonary:     Effort: Pulmonary effort is normal.     Breath sounds: Normal breath sounds.  Abdominal:     General: Bowel sounds are normal.  Musculoskeletal:        General: Tenderness present.  Skin:    Comments: Scalp tenderness  Neurological:     Mental Status: She is alert and oriented to person, place, and time.     BP 104/60   Pulse 80    Temp 97.8 F (36.6 C) (Temporal)   Ht 5' 3" (1.6 m)   Wt 145 lb 3.2 oz (65.9 kg)   BMI 25.72 kg/m  Wt Readings from Last 3 Encounters:  02/16/20 145 lb 3.2 oz (65.9 kg)  12/29/19 152 lb 12.8 oz (69.3 kg)  12/11/19 155 lb 3.2 oz (70.4 kg)    Health Maintenance Due  Topic Date Due  . Hepatitis C Screening  Never done  . URINE MICROALBUMIN  Never done  . PAP SMEAR-Modifier  11/28/2011  . TETANUS/TDAP  02/07/2012  . DEXA SCAN  12/06/2017  . COVID-19 Vaccine (3 - Pfizer risk 4-dose series) 06/26/2019  . INFLUENZA VACCINE  09/07/2019    There are no preventive care reminders to display for this patient.   Lab Results  Component Value Date   TSH 0.717 11/15/2018   Lab Results  Component Value Date   WBC 5.8 12/29/2019   HGB 11.1 (L) 12/29/2019   HCT 34.6 (L) 12/29/2019   MCV 98.0 12/29/2019   PLT 397 12/29/2019   Lab Results  Component Value Date   NA 140 12/29/2019   K 4.6 12/29/2019   CHLORIDE 102 01/28/2016   CO2 33 (H) 12/29/2019   GLUCOSE 86 12/29/2019   BUN 12 12/29/2019   CREATININE 0.84 12/29/2019   BILITOT 0.3 12/29/2019   ALKPHOS 80 12/29/2019   AST 15 12/29/2019   ALT 11 12/29/2019   PROT 7.6 12/29/2019   ALBUMIN 4.4 12/29/2019   CALCIUM 10.1 12/29/2019   ANIONGAP 5 12/29/2019   EGFR >90 01/28/2016   GFR 80.21 08/24/2014   Lab Results  Component Value Date   CHOL 192 06/18/2018   Lab Results  Component Value Date   HDL 75  06/18/2018   Lab Results  Component Value Date   LDLCALC 94 06/18/2018   Lab Results  Component Value Date   TRIG 116 06/18/2018   Lab Results  Component Value Date   CHOLHDL 2.6 06/18/2018   No results found for: HGBA1C     Assessment & Plan:   Problem List Items Addressed This Visit      Musculoskeletal and Integument   Herpes zoster dermatitis - Primary    Pain not well controlled.  Started patient on Valtrex 2000 mg tablet twice daily for 7 days. Follow-up with worsening hours of symptoms Rx sent to  pharmacy.        Relevant Medications   valACYclovir (VALTREX) 1000 MG tablet       Meds ordered this encounter  Medications  . valACYclovir (VALTREX) 1000 MG tablet    Sig: Take 1 tablet (1,000 mg total) by mouth 2 (two) times daily.    Dispense:  14 tablet    Refill:  0    Order Specific Question:   Supervising Provider    Answer:   Janora Norlander [[id number]]     Ivy Lynn, NP

## 2020-02-16 NOTE — Patient Instructions (Signed)
Shingles  Shingles is an infection. It gives you a painful skin rash and blisters that have fluid in them. Shingles is caused by the same germ (virus) that causes chickenpox. Shingles only happens in people who:  Have had chickenpox.  Have been given a shot of medicine (vaccine) to protect against chickenpox. Shingles is rare in this group. The first symptoms of shingles may be itching, tingling, or pain in an area on your skin. A rash will show on your skin a few days or weeks later. The rash is likely to be on one side of your body. The rash usually has a shape like a belt or a band. Over time, the rash turns into fluid-filled blisters. The blisters will break open, change into scabs, and dry up. Medicines may:  Help with pain and itching.  Help you get better sooner.  Help to prevent long-term problems. Follow these instructions at home: Medicines  Take over-the-counter and prescription medicines only as told by your doctor.  Put on an anti-itch cream or numbing cream where you have a rash, blisters, or scabs. Do this as told by your doctor. Helping with itching and discomfort  Put cold, wet cloths (cold compresses) on the area of the rash or blisters as told by your doctor.  Cool baths can help you feel better. Try adding baking soda or dry oatmeal to the water to lessen itching. Do not bathe in hot water.   Blister and rash care  Keep your rash covered with a loose bandage (dressing).  Wear loose clothing that does not rub on your rash.  Keep your rash and blisters clean. To do this, wash the area with mild soap and cool water as told by your doctor.  Check your rash every day for signs of infection. Check for: ? More redness, swelling, or pain. ? Fluid or blood. ? Warmth. ? Pus or a bad smell.  Do not scratch your rash. Do not pick at your blisters. To help you to not scratch: ? Keep your fingernails clean and cut short. ? Wear gloves or mittens when you sleep, if  scratching is a problem. General instructions  Rest as told by your doctor.  Keep all follow-up visits as told by your doctor. This is important.  Wash your hands often with soap and water. If soap and water are not available, use hand sanitizer. Doing this lowers your chance of getting a skin infection caused by germs (bacteria).  Your infection can cause chickenpox in people who have never had chickenpox or never got a shot of chickenpox vaccine. If you have blisters that did not change into scabs yet, try not to touch other people or be around other people, especially: ? Babies. ? Pregnant women. ? Children who have areas of red, itchy, or rough skin (eczema). ? Very old people who have transplants. ? People who have a long-term (chronic) sickness, like cancer or AIDS. Contact a doctor if:  Your pain does not get better with medicine.  Your pain does not get better after the rash heals.  You have any signs of infection in the rash area. These signs include: ? More redness, swelling, or pain around the rash. ? Fluid or blood coming from the rash. ? The rash area feeling warm to the touch. ? Pus or a bad smell coming from the rash. Get help right away if:  The rash is on your face or nose.  You have pain in your face or pain  by your eye.  You lose feeling on one side of your face.  You have trouble seeing.  You have ear pain, or you have ringing in your ear.  You have a loss of taste.  Your condition gets worse. Summary  Shingles gives you a painful skin rash and blisters that have fluid in them.  Shingles is an infection. It is caused by the same germ (virus) that causes chickenpox.  Keep your rash covered with a loose bandage (dressing). Wear loose clothing that does not rub on your rash.  If you have blisters that did not change into scabs yet, try not to touch other people or be around people. This information is not intended to replace advice given to you by  your health care provider. Make sure you discuss any questions you have with your health care provider. Document Revised: 05/17/2018 Document Reviewed: 09/27/2016 Elsevier Patient Education  2021 [employer].

## 2020-02-24 ENCOUNTER — Other Ambulatory Visit: Payer: Self-pay | Admitting: Family Medicine

## 2020-02-24 ENCOUNTER — Other Ambulatory Visit: Payer: Self-pay | Admitting: Adult Health

## 2020-02-24 DIAGNOSIS — I10 Essential (primary) hypertension: Secondary | ICD-10-CM

## 2020-03-23 ENCOUNTER — Encounter: Payer: Self-pay | Admitting: Gastroenterology

## 2020-04-09 ENCOUNTER — Other Ambulatory Visit: Payer: Self-pay | Admitting: Family Medicine

## 2020-04-09 DIAGNOSIS — F419 Anxiety disorder, unspecified: Secondary | ICD-10-CM

## 2020-04-13 ENCOUNTER — Ambulatory Visit (INDEPENDENT_AMBULATORY_CARE_PROVIDER_SITE_OTHER): Payer: Medicare Other | Admitting: Family Medicine

## 2020-04-13 DIAGNOSIS — R3989 Other symptoms and signs involving the genitourinary system: Secondary | ICD-10-CM | POA: Diagnosis not present

## 2020-04-13 DIAGNOSIS — J301 Allergic rhinitis due to pollen: Secondary | ICD-10-CM

## 2020-04-13 MED ORDER — CEPHALEXIN 500 MG PO CAPS: 500.0000 mg | ORAL_CAPSULE | Freq: Two times a day (BID) | ORAL | 0 refills | Status: AC

## 2020-04-13 MED ORDER — LORATADINE 10 MG PO TABS: 10.0000 mg | ORAL_TABLET | Freq: Every day | ORAL | 11 refills | Status: DC

## 2020-04-13 NOTE — Progress Notes (Signed)
Telephone visit  Subjective: CC: UTI? Sore throat PCP: [name], MD Amy Black is a 80 y.o. female calls for telephone consult today. Patient provides verbal consent for consult held via phone.  Due to COVID-19 pandemic this visit was conducted virtually. This visit type was conducted due to national recommendations for restrictions regarding the COVID-19 Pandemic (e.g. social distancing, sheltering in place) in an effort to limit this patient's exposure and mitigate transmission in our community. All issues noted in this document were discussed and addressed.  A physical exam was not performed with this format.   Location of patient: home Location of provider: [facility] Others present for call: none  1. Possible UTI Patient reports urinary frequency x4-5 days. No hematuria, dysuria.  She reports urinary urgency.  She has been drinking cranberry juice.    2. Sore throat Patient reports intermittent sore throat that onset 3 days ago.  She reports post nasal drip.   ROS: Per HPI  Allergies  Allergen Reactions  . Iodine Itching  . Amlodipine Other (See Comments)    headaches  . Lexapro [Escitalopram Oxalate] Other (See Comments)    Drunk, High feeling  . Cymbalta [Duloxetine Hcl] Other (See Comments)    sleepiness  . Gabapentin Other (See Comments)    sleepiness  . Ivp Dye [Iodinated Diagnostic Agents] Itching  . Zanaflex [Tizanidine Hcl] Other (See Comments)    Very drunk feeling next day   Past Medical History:  Diagnosis Date  . Adenocarcinoma (HCC)    LEFT LEG  . Adenocarcinoma of unknown primary ([place]) 02/06/2011  . 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 Allergy   . Anxiety   . CAD (coronary artery disease)    a. minimal CAD by cath in 2016 with presumed spasm (20% mLAD, 20% D2).  . Cataract    bilateral cataracts removed  . Coronary artery spasm ([place])   . Diverticulosis of colon (without mention of hemorrhage)   . GERD  (gastroesophageal reflux disease)   . hepatic cyst   . Hip fracture, right ([place])   . History of cardiac catheterization   . History of nuclear stress test 04/2011   lexiscan; normal study, no significant ischemia, low risk; 2015 - normal  . HTN (hypertension)    PT. DENIES  . Hyperlipidemia   . Insomnia   . Irritable bowel syndrome   . Transverse myelitis ([place])   . Vitamin D deficiency     Current Outpatient Medications:  .  ALPRAZolam (XANAX) 0.5 MG tablet, Take 1 tablet (0.5 mg total) by mouth 3 (three) times daily., Disp: 90 tablet, Rfl: 5 .  Calcium Citrate-Vitamin D (CALCIUM CITRATE + D3) 315-250 MG-UNIT TABS, Take 2 tablets by mouth daily., Disp: 120 tablet, Rfl:  .  fluticasone (FLONASE) 50 MCG/ACT nasal spray, Place 2 sprays into both nostrils daily., Disp: 16 g, Rfl: 9 .  Iron-FA-B Cmp-C-Biot-Probiotic (FUSION PLUS) CAPS, Take 1 capsule by mouth daily., Disp: 30 capsule, Rfl: 6 .  isosorbide mononitrate (IMDUR) 120 MG 24 hr tablet, Take 2 tablets (240 mg total) by mouth daily., Disp: 180 tablet, Rfl: 3 .  loperamide (IMODIUM) 2 MG capsule, 1 capsule by mouth four times daily PRN, Disp: 120 capsule, Rfl: 0 .  methocarbamol (ROBAXIN) 500 MG tablet, Take 1 tablet (500 mg total) by mouth 4 (four) times daily., Disp: 120 tablet, Rfl: 2 .  nabumetone (RELAFEN) 500 MG tablet, TAKE ONE TABLET BY MOUTH TWICE DAILY FOR MUSCLE &  JOINT PAIN, Disp: 180 tablet, Rfl: 1 .  nitroGLYCERIN (NITROSTAT) 0.4 MG SL tablet, DISSOLVE ONE TABLET UNDER TONGUE EVERY 5 MINUTES UP TO 3 DOSES AS NEEDED FOR CHEST PAIN, Disp: 25 tablet, Rfl: 5 .  pantoprazole (PROTONIX) 20 MG tablet, TAKE TWO (2) TABLETS BY MOUTH IN THE MORNING AND TAKE ONE TABLET IN THE EVENING, Disp: 90 tablet, Rfl: 7 .  traMADol (ULTRAM) 50 MG tablet, TAKE ONE TABLET BY MOUTH EVERY TWELVE HOURS AS NEEDED., Disp: 60 tablet, Rfl: 5 .  triamterene-hydrochlorothiazide (DYAZIDE) 37.5-25 MG capsule, TAKE ONE CAPSULE BY MOUTH 3 TIMES PER WEEK, Disp:  30 capsule, Rfl: 5 .  valACYclovir (VALTREX) 1000 MG tablet, Take 1 tablet (1,000 mg total) by mouth 2 (two) times daily., Disp: 14 tablet, Rfl: 0 .  verapamil (CALAN-SR) 120 MG CR tablet, Take 1 tablet (120 mg total) by mouth at bedtime., Disp: 90 tablet, Rfl: 1  Assessment/ Plan: 80 y.o. female   Suspected UTI - Plan: cephALEXin (KEFLEX) 500 MG capsule  Seasonal allergic rhinitis due to pollen - Plan: loratadine (CLARITIN) 10 MG tablet  Keflex sent in.  Home care instructions reviewed including increased water hydration.  We discussed red flag signs and symptoms warranting further evaluation.  She voiced good understanding will follow up as needed  With regards to the postnasal drip and sore throat, I suspect this is allergy mediated given change in weather.  Claritin recommended.  She has Flonase if needed.  Follow-up as needed  Start time: 1:05pm End time: 1:10pm  Total time spent on patient care (including telephone call/ virtual visit): 5 minutes  Glenn Dale, [place] [phone]

## 2020-04-15 ENCOUNTER — Encounter: Payer: Self-pay | Admitting: Hematology & Oncology

## 2020-04-15 ENCOUNTER — Telehealth: Payer: Self-pay

## 2020-04-15 ENCOUNTER — Other Ambulatory Visit: Payer: Self-pay

## 2020-04-15 ENCOUNTER — Inpatient Hospital Stay: Payer: Medicare Other | Attending: Hematology & Oncology

## 2020-04-15 ENCOUNTER — Inpatient Hospital Stay: Payer: Medicare Other | Admitting: Hematology & Oncology

## 2020-04-15 VITALS — BP 152/75 | HR 72 | Temp 97.8°F | Resp 18 | Wt 149.0 lb

## 2020-04-15 DIAGNOSIS — Z859 Personal history of malignant neoplasm, unspecified: Secondary | ICD-10-CM | POA: Insufficient documentation

## 2020-04-15 DIAGNOSIS — D75839 Thrombocytosis, unspecified: Secondary | ICD-10-CM | POA: Insufficient documentation

## 2020-04-15 DIAGNOSIS — Z923 Personal history of irradiation: Secondary | ICD-10-CM | POA: Diagnosis not present

## 2020-04-15 DIAGNOSIS — C801 Malignant (primary) neoplasm, unspecified: Secondary | ICD-10-CM

## 2020-04-15 DIAGNOSIS — D500 Iron deficiency anemia secondary to blood loss (chronic): Secondary | ICD-10-CM

## 2020-04-15 DIAGNOSIS — D649 Anemia, unspecified: Secondary | ICD-10-CM | POA: Insufficient documentation

## 2020-04-15 LAB — CMP (CANCER CENTER ONLY)
ALT: 12 U/L (ref 0–44)
AST: 16 U/L (ref 15–41)
Albumin: 4.6 g/dL (ref 3.5–5.0)
Alkaline Phosphatase: 88 U/L (ref 38–126)
Anion gap: 5 (ref 5–15)
BUN: 12 mg/dL (ref 8–23)
CO2: 32 mmol/L (ref 22–32)
Calcium: 10.4 mg/dL — ABNORMAL HIGH (ref 8.9–10.3)
Chloride: 100 mmol/L (ref 98–111)
Creatinine: 0.8 mg/dL (ref 0.44–1.00)
GFR, Estimated: >60 mL/min (ref >60)
Glucose, Bld: 99 mg/dL (ref 70–99)
Potassium: 4.3 mmol/L (ref 3.5–5.1)
Sodium: 137 mmol/L (ref 135–145)
Total Bilirubin: 0.4 mg/dL (ref 0.3–1.2)
Total Protein: 7.4 g/dL (ref 6.5–8.1)

## 2020-04-15 LAB — RETICULOCYTES
Immature Retic Fract: 12.6 % (ref 2.3–15.9)
RBC.: 3.51 MIL/uL — ABNORMAL LOW (ref 3.87–5.11)
Retic Count, Absolute: 62.1 10*3/uL (ref 19.0–186.0)
Retic Ct Pct: 1.8 % (ref 0.4–3.1)

## 2020-04-15 LAB — CBC WITH DIFFERENTIAL (CANCER CENTER ONLY)
Abs Immature Granulocytes: 0.05 10*3/uL (ref 0.00–0.07)
Basophils Absolute: 0.1 10*3/uL (ref 0.0–0.1)
Basophils Relative: 1 %
Eosinophils Absolute: 0.1 10*3/uL (ref 0.0–0.5)
Eosinophils Relative: 2 %
HCT: 33.7 % — ABNORMAL LOW (ref 36.0–46.0)
Hemoglobin: 11.1 g/dL — ABNORMAL LOW (ref 12.0–15.0)
Immature Granulocytes: 1 %
Lymphocytes Relative: 40 %
Lymphs Abs: 2.5 10*3/uL (ref 0.7–4.0)
MCH: 31.5 pg (ref 26.0–34.0)
MCHC: 32.9 g/dL (ref 30.0–36.0)
MCV: 95.7 fL (ref 80.0–100.0)
Monocytes Absolute: 0.5 10*3/uL (ref 0.1–1.0)
Monocytes Relative: 8 %
Neutro Abs: 3 10*3/uL (ref 1.7–7.7)
Neutrophils Relative %: 48 %
Platelet Count: 408 10*3/uL — ABNORMAL HIGH (ref 150–400)
RBC: 3.52 MIL/uL — ABNORMAL LOW (ref 3.87–5.11)
RDW: 14 % (ref 11.5–15.5)
WBC Count: 6.1 10*3/uL (ref 4.0–10.5)
nRBC: 0 % (ref 0.0–0.2)

## 2020-04-15 NOTE — Telephone Encounter (Signed)
appts made and printed for pt per 04/15/20 los  Amy Black

## 2020-04-15 NOTE — Progress Notes (Signed)
Hematology and Oncology Follow Up Visit  Amy Black [id number] 12-17-1940 80 y.o. 04/15/2020   Principle Diagnosis:  Transient thrombocytosis History transverse myelitis History of adenosquamous carcinoma of unknown primary Iron def anemia  Current Therapy:   Fusion Plus -- daily   Interim History:  Amy Black is here today for follow-up.  She is doing quite well.  We last saw her back in November.  Her blood pressure seems to be doing much better which is nice to see.  She had no problems over the holiday season.  She was with her family.  We still are dealing with her anemia.  She says she is taking her oral iron.  When we last saw her, her ferritin was 120 with an iron saturation of 29%.  I went ahead to check her erythropoietin level to see if that might be on the low side.  She has had no bleeding.  There is been no change in bowel or bladder habits.  She has had no rashes.  There has been no leg swelling.  She has had no fever.  She has been able to avoid the coronavirus.  Overall, performance status is ECOG 1.    Medications:  Allergies as of 04/15/2020      Reactions   Iodine Itching   Amlodipine Other (See Comments)   headaches   Lexapro [escitalopram Oxalate] Other (See Comments)   Drunk, High feeling   Cymbalta [duloxetine Hcl] Other (See Comments)   sleepiness   Gabapentin Other (See Comments)   sleepiness   Ivp Dye [iodinated Diagnostic Agents] Itching   Zanaflex [tizanidine Hcl] Other (See Comments)   Very drunk feeling next day      Medication List       Accurate as of April 15, 2020  2:05 PM. If you have any questions, ask your nurse or doctor.        ALPRAZolam 0.5 MG tablet Commonly known as: XANAX Take 1 tablet (0.5 mg total) by mouth 3 (three) times daily.   Calcium Citrate-Vitamin D 315-250 MG-UNIT Tabs Commonly known as: Calcium Citrate + D3 Take 2 tablets by mouth daily.   cephALEXin 500 MG capsule Commonly known as: KEFLEX Take 1  capsule (500 mg total) by mouth 2 (two) times daily for 7 days.   cyclobenzaprine 5 MG tablet Commonly known as: FLEXERIL Take 5 mg by mouth as needed.   fluticasone 50 MCG/ACT nasal spray Commonly known as: FLONASE Place 2 sprays into both nostrils daily.   Fusion Plus Caps Take 1 capsule by mouth daily.   isosorbide mononitrate 120 MG 24 hr tablet Commonly known as: IMDUR Take 2 tablets (240 mg total) by mouth daily.   loperamide 2 MG capsule Commonly known as: IMODIUM 1 capsule by mouth four times daily PRN   loratadine 10 MG tablet Commonly known as: CLARITIN Take 1 tablet (10 mg total) by mouth daily.   loratadine 10 MG tablet Commonly known as: CLARITIN Take 10 mg by mouth.   methocarbamol 500 MG tablet Commonly known as: ROBAXIN Take 1 tablet (500 mg total) by mouth 4 (four) times daily.   nabumetone 500 MG tablet Commonly known as: RELAFEN TAKE ONE TABLET BY MOUTH TWICE DAILY FOR MUSCLE & JOINT PAIN   nitroGLYCERIN 0.4 MG SL tablet Commonly known as: NITROSTAT DISSOLVE ONE TABLET UNDER TONGUE EVERY 5 MINUTES UP TO 3 DOSES AS NEEDED FOR CHEST PAIN   pantoprazole 20 MG tablet Commonly known as: PROTONIX TAKE TWO (2) TABLETS  BY MOUTH IN THE MORNING AND TAKE ONE TABLET IN THE EVENING   traMADol 50 MG tablet Commonly known as: ULTRAM TAKE ONE TABLET BY MOUTH EVERY TWELVE HOURS AS NEEDED.   triamterene-hydrochlorothiazide 37.5-25 MG capsule Commonly known as: DYAZIDE TAKE ONE CAPSULE BY MOUTH 3 TIMES PER WEEK   valACYclovir 1000 MG tablet Commonly known as: Valtrex Take 1 tablet (1,000 mg total) by mouth 2 (two) times daily.   verapamil 120 MG CR tablet Commonly known as: CALAN-SR Take 1 tablet (120 mg total) by mouth at bedtime.       Allergies:  Allergies  Allergen Reactions  . Iodine Itching  . Amlodipine Other (See Comments)    headaches  . Lexapro [Escitalopram Oxalate] Other (See Comments)    Drunk, High feeling  . Cymbalta [Duloxetine  Hcl] Other (See Comments)    sleepiness  . Gabapentin Other (See Comments)    sleepiness  . Ivp Dye [Iodinated Diagnostic Agents] Itching  . Zanaflex [Tizanidine Hcl] Other (See Comments)    Very drunk feeling next day    Past Medical History, Surgical history, Social history, and Family History were reviewed and updated.  Review of Systems: Review of Systems  Constitutional: Negative.   HENT: Negative.   Eyes: Negative.   Respiratory: Negative.   Cardiovascular: Negative.   Gastrointestinal: Negative.   Genitourinary: Negative.   Musculoskeletal: Negative.   Skin: Negative.   Neurological: Negative.   Endo/Heme/Allergies: Negative.   Psychiatric/Behavioral: Negative.       Physical Exam:  weight is 149 lb (67.6 kg). Her oral temperature is 97.8 F (36.6 C). Her blood pressure is 152/75 (abnormal) and her pulse is 72. Her respiration is 18 and oxygen saturation is 100%.   Wt Readings from Last 3 Encounters:  04/15/20 149 lb (67.6 kg)  02/16/20 145 lb 3.2 oz (65.9 kg)  12/29/19 152 lb 12.8 oz (69.3 kg)    Physical Exam Vitals reviewed.  HENT:     Head: Normocephalic and atraumatic.  Eyes:     Pupils: Pupils are equal, round, and reactive to light.  Cardiovascular:     Rate and Rhythm: Normal rate and regular rhythm.     Heart sounds: Normal heart sounds.  Pulmonary:     Effort: Pulmonary effort is normal.     Breath sounds: Normal breath sounds.  Abdominal:     General: Bowel sounds are normal.     Palpations: Abdomen is soft.  Musculoskeletal:        General: No tenderness or deformity. Normal range of motion.     Cervical back: Normal range of motion.  Lymphadenopathy:     Cervical: No cervical adenopathy.  Skin:    General: Skin is warm and dry.     Findings: No erythema or rash.  Neurological:     Mental Status: She is alert and oriented to person, place, and time.  Psychiatric:        Behavior: Behavior normal.        Thought Content: Thought  content normal.        Judgment: Judgment normal.      Lab Results  Component Value Date   WBC 6.1 04/15/2020   HGB 11.1 (L) 04/15/2020   HCT 33.7 (L) 04/15/2020   MCV 95.7 04/15/2020   PLT 408 (H) 04/15/2020   Lab Results  Component Value Date   FERRITIN 120 12/29/2019   IRON 70 12/29/2019   TIBC 243 12/29/2019   UIBC 172 12/29/2019   IRONPCTSAT  29 12/29/2019   Lab Results  Component Value Date   RETICCTPCT 1.8 04/15/2020   RBC 3.52 (L) 04/15/2020   RBC 3.51 (L) 04/15/2020   No results found for: KPAFRELGTCHN, LAMBDASER, KAPLAMBRATIO No results found for: IGGSERUM, IGA, IGMSERUM No results found for: Odetta Pink, SPEI   Chemistry      Component Value Date/Time   NA 137 04/15/2020 1309   NA 139 01/27/2019 1053   NA 138 01/28/2016 1303   K 4.3 04/15/2020 1309   K 4.2 07/28/2016 1243   K 4.2 01/28/2016 1303   CL 100 04/15/2020 1309   CL 98 07/28/2016 1243   CL 101 07/20/2014 1131   CO2 32 04/15/2020 1309   CO2 29 07/28/2016 1243   CO2 30 (H) 01/28/2016 1303   BUN 12 04/15/2020 1309   BUN 15 01/27/2019 1053   BUN 10.0 01/28/2016 1303   CREATININE 0.80 04/15/2020 1309   CREATININE 0.94 07/28/2016 1243   CREATININE 0.8 01/28/2016 1303      Component Value Date/Time   CALCIUM 10.4 (H) 04/15/2020 1309   CALCIUM 9.9 07/28/2016 1243   CALCIUM 10.0 01/28/2016 1303   ALKPHOS 88 04/15/2020 1309   ALKPHOS 105 07/28/2016 1243   ALKPHOS 123 01/28/2016 1303   AST 16 04/15/2020 1309   AST 13 01/28/2016 1303   ALT 12 04/15/2020 1309   ALT 12 01/28/2016 1303   BILITOT 0.4 04/15/2020 1309   BILITOT 0.51 01/28/2016 1303       Impression and Plan: Amy Black is a very pleasant 80yo African American female with history of transient thrombocytosis and also history of adenosquamous carcinoma with unknown primary.   So far, there is no evidence of recurrence of the adenosquamous carcinoma.  This was in her leg.  She  had surgery and radiation for this.  This had to be about 17 years ago.  For right now, her anemia is pretty stable.  As such, we will just watch this.  She will stay on the oral iron.  We will see her back in 6 months now.   Volanda Napoleon, MD 3/10/20222:05 PM

## 2020-04-16 LAB — IRON AND TIBC
Iron: 72 ug/dL (ref 41–142)
Saturation Ratios: 30 % (ref 21–57)
TIBC: 240 ug/dL (ref 236–444)
UIBC: 168 ug/dL (ref 120–384)

## 2020-04-16 LAB — FERRITIN: Ferritin: 165 ng/mL (ref 11–307)

## 2020-04-16 LAB — ERYTHROPOIETIN: Erythropoietin: 14.3 m[IU]/mL (ref 2.6–18.5)

## 2020-04-28 ENCOUNTER — Ambulatory Visit: Payer: Medicare Other | Admitting: Family Medicine

## 2020-04-29 ENCOUNTER — Other Ambulatory Visit: Payer: Self-pay | Admitting: Hematology & Oncology

## 2020-05-05 ENCOUNTER — Encounter: Payer: Self-pay | Admitting: Family Medicine

## 2020-05-05 ENCOUNTER — Ambulatory Visit (INDEPENDENT_AMBULATORY_CARE_PROVIDER_SITE_OTHER): Payer: Medicare Other | Admitting: Family Medicine

## 2020-05-05 ENCOUNTER — Other Ambulatory Visit: Payer: Self-pay

## 2020-05-05 VITALS — BP 117/64 | HR 63 | Temp 97.5°F | Ht 63.0 in | Wt 148.4 lb

## 2020-05-05 DIAGNOSIS — F419 Anxiety disorder, unspecified: Secondary | ICD-10-CM | POA: Diagnosis not present

## 2020-05-05 DIAGNOSIS — K219 Gastro-esophageal reflux disease without esophagitis: Secondary | ICD-10-CM | POA: Diagnosis not present

## 2020-05-05 DIAGNOSIS — M47812 Spondylosis without myelopathy or radiculopathy, cervical region: Secondary | ICD-10-CM

## 2020-05-05 DIAGNOSIS — G373 Acute transverse myelitis in demyelinating disease of central nervous system: Secondary | ICD-10-CM | POA: Diagnosis not present

## 2020-05-05 MED ORDER — HYOSCYAMINE SULFATE SL 0.125 MG SL SUBL: 0.1250 mg | SUBLINGUAL_TABLET | SUBLINGUAL | 5 refills | Status: DC | PRN

## 2020-05-05 MED ORDER — ALPRAZOLAM 0.5 MG PO TABS: 0.5000 mg | ORAL_TABLET | Freq: Three times a day (TID) | ORAL | 5 refills | Status: DC

## 2020-05-05 MED ORDER — FUSION PLUS PO CAPS: 1.0000 | ORAL_CAPSULE | Freq: Every day | ORAL | 6 refills | Status: DC

## 2020-05-05 MED ORDER — TRAMADOL HCL 50 MG PO TABS: ORAL_TABLET | ORAL | 1 refills | Status: DC

## 2020-05-05 NOTE — Progress Notes (Signed)
Subjective:  Patient ID: Amy Black, female    DOB: 19-Jul-1940  Age: 80 y.o. MRN: [id number]  CC: Medical Management of Chronic Issues   HPI [name] presents for IBS sx. Diarrhea - constant for several hours in a day every 2-3 months. Gets pain in mid abdomen more frequently. No constipation..   presents for  follow-up of hypertension. Patient has no history of headache chest pain or shortness of breath or recent cough. Patient also denies symptoms of TIA such as focal numbness or weakness. Patient denies side effects from medication. States taking it regularly.  Patient in for follow-up of GERD. Currently asymptomatic taking  PPI daily. There is no chest pain or heartburn. No hematemesis and no melena. No dysphagia or choking. Onset is remote. Progression is stable. Complicating factors, none.   States anxiety is stable.  She is taking her Xanax 3 times a day every day.  She denies any adverse reactions.  She also takes tramadol.  She takes this infrequently.Marland Kitchen  PDMP shows that she has not filled it since early December, almost 4 months ago.  She says she has 8 pills left from that bottle.  She takes it when her pain gets really bad from arthritis either in the neck or from her transverse myelitis. She has been having trouble tolerating her iron so Dr. Marin Olp gave her a prescription for a different type of iron.  She wants that renewed here as well.   Depression screen [facility] 2/9 05/05/2020 10/30/2019 09/16/2019  Decreased Interest 0 0 0  Down, Depressed, Hopeless 0 0 0  PHQ - 2 Score 0 0 0  Altered sleeping - - -  Tired, decreased energy - - -  Change in appetite - - -  Feeling bad or failure about yourself  - - -  Trouble concentrating - - -  Moving slowly or fidgety/restless - - -  Suicidal thoughts - - -  PHQ-9 Score - - -  Some recent data might be hidden    History [name] has a past medical history of Adenocarcinoma ([place]), Adenocarcinoma of unknown primary ([place])  (02/06/2011), Adenosquamous carcinoma, Allergy, Anxiety, CAD (coronary artery disease), Cataract, Coronary artery spasm ([place]), Diverticulosis of colon (without mention of hemorrhage), Dizziness (01/09/2019), Femoral neck fracture, right, closed, initial encounter (10/04/2015), GERD (gastroesophageal reflux disease), hepatic cyst, Hip fracture, right ([place]), History of cardiac catheterization, History of nuclear stress test (04/2011), HTN (hypertension), Hyperlipidemia, Insomnia, Irritable bowel syndrome, Transverse myelitis ([place]), and Vitamin D deficiency.   She has a past surgical history that includes Resection soft tissue tumor leg / ankle radical (2004); Diagnostic laparoscopy; Lysis of adhesion; Salpingoophorectomy (Left); Pelvic laparoscopy (1992); Cardiac catheterization; transthoracic echocardiogram (07/2012); Cardiac catheterization (N/A, 08/28/2014); Colonoscopy; Upper gastrointestinal endoscopy; Total hip arthroplasty (Right, 10/04/2015); and Abdominal hysterectomy (Bilateral).   Her family history includes Bladder Cancer in her mother; Coronary artery disease in an other family member; Depression in her sister; Diabetes in her brother and another family member; Heart disease in her father and mother; Hyperlipidemia in her sister; Hypertension in her brother and mother; Seizures in her brother; Stroke in her father.She reports that she quit smoking about 47 years ago. Her smoking use included cigarettes. She started smoking about 51 years ago. She has a 2.00 pack-year smoking history. She has never used smokeless tobacco. She reports that she does not drink alcohol and does not use drugs.    ROS Review of Systems  Constitutional: Negative.   HENT: Negative.  Eyes: Negative for visual disturbance.  Respiratory: Negative for shortness of breath.   Cardiovascular: Negative for chest pain.  Gastrointestinal: Positive for abdominal distention and abdominal pain (See HPI see HPI).  Musculoskeletal:  Negative for arthralgias.    Objective:  BP 117/64   Pulse 63   Temp (!) 97.5 F (36.4 C)   Ht 5\' 3"  (1.6 m)   Wt 148 lb 6.4 oz (67.3 kg)   SpO2 100%   BMI 26.29 kg/m   BP Readings from Last 3 Encounters:  05/05/20 117/64  04/15/20 (!) 152/75  02/16/20 104/60    Wt Readings from Last 3 Encounters:  05/05/20 148 lb 6.4 oz (67.3 kg)  04/15/20 149 lb (67.6 kg)  02/16/20 145 lb 3.2 oz (65.9 kg)     Physical Exam Constitutional:      General: She is not in acute distress.    Appearance: She is well-developed.  HENT:     Head: Normocephalic and atraumatic.  Eyes:     Conjunctiva/sclera: Conjunctivae normal.     Pupils: Pupils are equal, round, and reactive to light.  Neck:     Thyroid: No thyromegaly.  Cardiovascular:     Rate and Rhythm: Normal rate and regular rhythm.     Heart sounds: Normal heart sounds. No murmur heard.   Pulmonary:     Effort: Pulmonary effort is normal. No respiratory distress.     Breath sounds: Normal breath sounds. No wheezing or rales.  Abdominal:     General: Bowel sounds are normal. There is no distension.     Palpations: Abdomen is soft.     Tenderness: There is no abdominal tenderness.  Musculoskeletal:        General: Normal range of motion.     Cervical back: Normal range of motion and neck supple.  Lymphadenopathy:     Cervical: No cervical adenopathy.  Skin:    General: Skin is warm and dry.  Neurological:     Mental Status: She is alert and oriented to person, place, and time.  Psychiatric:        Behavior: Behavior normal.        Thought Content: Thought content normal.        Judgment: Judgment normal.       Assessment & Plan:   Christiona was seen today for medical management of chronic issues.  Diagnoses and all orders for this visit:  Gastroesophageal reflux disease without esophagitis  Anxiety -     ALPRAZolam (XANAX) 0.5 MG tablet; Take 1 tablet (0.5 mg total) by mouth 3 (three) times daily.  Transverse  myelitis (HCC) -     traMADol (ULTRAM) 50 MG tablet; TAKE ONE TABLET BY MOUTH EVERY TWELVE HOURS AS NEEDED.  Arthritis of neck  Other orders -     Iron-FA-B Cmp-C-Biot-Probiotic (FUSION PLUS) CAPS; Take 1 capsule by mouth daily. -     Hyoscyamine Sulfate SL (LEVSIN/SL) 0.125 MG SUBL; Place 0.125 mg under the tongue every 4 (four) hours as needed (abd cramping and diarrhea).       I have discontinued Lorenz Coaster. Enlow's loperamide. I have also changed her Fusion Plus. Additionally, I am having her start on Hyoscyamine Sulfate SL. Lastly, I am having her maintain her Calcium Citrate-Vitamin D, fluticasone, methocarbamol, nitroGLYCERIN, triamterene-hydrochlorothiazide, nabumetone, verapamil, isosorbide mononitrate, valACYclovir, pantoprazole, loratadine, cyclobenzaprine, ALPRAZolam, and traMADol.  Allergies as of 05/05/2020      Reactions   Iodine Itching   Amlodipine Other (See Comments)   headaches  Lexapro [escitalopram Oxalate] Other (See Comments)   Drunk, High feeling   Cymbalta [duloxetine Hcl] Other (See Comments)   sleepiness   Gabapentin Other (See Comments)   sleepiness   Ivp Dye [iodinated Diagnostic Agents] Itching   Zanaflex [tizanidine Hcl] Other (See Comments)   Very drunk feeling next day      Medication List       Accurate as of May 05, 2020  2:01 PM. If you have any questions, ask your nurse or doctor.        STOP taking these medications   loperamide 2 MG capsule Commonly known as: IMODIUM Stopped by: [name], MD     TAKE these medications   ALPRAZolam 0.5 MG tablet Commonly known as: XANAX Take 1 tablet (0.5 mg total) by mouth 3 (three) times daily.   Calcium Citrate-Vitamin D 315-250 MG-UNIT Tabs Commonly known as: Calcium Citrate + D3 Take 2 tablets by mouth daily.   cyclobenzaprine 5 MG tablet Commonly known as: FLEXERIL Take 5 mg by mouth as needed.   fluticasone 50 MCG/ACT nasal spray Commonly known as: FLONASE Place 2 sprays  into both nostrils daily.   Fusion Plus Caps Take 1 capsule by mouth daily.   Hyoscyamine Sulfate SL 0.125 MG Subl Commonly known as: Levsin/SL Place 0.125 mg under the tongue every 4 (four) hours as needed (abd cramping and diarrhea). Started by: [name], MD   isosorbide mononitrate 120 MG 24 hr tablet Commonly known as: IMDUR Take 2 tablets (240 mg total) by mouth daily.   loratadine 10 MG tablet Commonly known as: CLARITIN Take 1 tablet (10 mg total) by mouth daily. What changed: Another medication with the same name was removed. Continue taking this medication, and follow the directions you see here. Changed by: [name], MD   methocarbamol 500 MG tablet Commonly known as: ROBAXIN Take 1 tablet (500 mg total) by mouth 4 (four) times daily.   nabumetone 500 MG tablet Commonly known as: RELAFEN TAKE ONE TABLET BY MOUTH TWICE DAILY FOR MUSCLE & JOINT PAIN   nitroGLYCERIN 0.4 MG SL tablet Commonly known as: NITROSTAT DISSOLVE ONE TABLET UNDER TONGUE EVERY 5 MINUTES UP TO 3 DOSES AS NEEDED FOR CHEST PAIN   pantoprazole 20 MG tablet Commonly known as: PROTONIX TAKE TWO (2) TABLETS BY MOUTH IN THE MORNING AND TAKE ONE TABLET IN THE EVENING   traMADol 50 MG tablet Commonly known as: ULTRAM TAKE ONE TABLET BY MOUTH EVERY TWELVE HOURS AS NEEDED.   triamterene-hydrochlorothiazide 37.5-25 MG capsule Commonly known as: DYAZIDE TAKE ONE CAPSULE BY MOUTH 3 TIMES PER WEEK   valACYclovir 1000 MG tablet Commonly known as: Valtrex Take 1 tablet (1,000 mg total) by mouth 2 (two) times daily.   verapamil 120 MG CR tablet Commonly known as: CALAN-SR Take 1 tablet (120 mg total) by mouth at bedtime.        Follow-up: Return in about 6 months (around 11/05/2020).  [name], M.D.

## 2020-05-18 DIAGNOSIS — R350 Frequency of micturition: Secondary | ICD-10-CM | POA: Diagnosis not present

## 2020-05-18 DIAGNOSIS — N281 Cyst of kidney, acquired: Secondary | ICD-10-CM | POA: Diagnosis not present

## 2020-05-31 ENCOUNTER — Other Ambulatory Visit: Payer: Self-pay | Admitting: Family Medicine

## 2020-05-31 DIAGNOSIS — I10 Essential (primary) hypertension: Secondary | ICD-10-CM

## 2020-06-07 ENCOUNTER — Encounter: Payer: Self-pay | Admitting: Cardiology

## 2020-06-07 NOTE — Progress Notes (Signed)
Cardiology Office Note   Date:  06/09/2020   ID:  Amy Black, DOB March 07, 1940, MRN [id number]  PCP:  [name], MD  Cardiologist:   Minus Breeding, MD   Chief Complaint  Patient presents with  . precordial chest pain      History of Present Illness: Amy Black is a 80 y.o. female who presents for a follow up of presumed coronary spasm. On 08/28/2014, she had left heart cath that showed minimal CAD, 20% disease in LAD was normal LVEF. He also had history of palpitations and hypertension. She has been on verapamil for greater than 25 years.  She has had recurrent chest pain and has been treated with increased doses of Imdur.  She had relief of her pain after being taken off of verapamil and started on Cardizem.  She continued to have some symptoms as described and subsequently she was changed back to verapamil. She had chest pain when I last saw her and she I increased her Imdur.    Since I last saw her she has been getting spells that have started relatively recently.  These are what she calls me episodes.  They happen when she has been off typically although she has had some more lying down.  She might be in her kitchen doing some stuff and she has to go lie down.  She feels like she might pass out.  This happened about 3 or 4 years ago.  She does not think this is similar to the symptoms she had when she wore her last monitor.  She is not describing chest pain.  She has a vague discomfort no change previous.  This does not happen at the same time as the spells.  She said these happen 3 out of 7 days.  She has to lie down for 3 to 4 hours.  She says she is checked her blood pressure and gets normal at these times.  She is not really describing orthostatic symptoms.  She says her heart is beating hard when it happens but she does not notice particularly fast or irregular.  She has not had any frank syncope.  She denies any new shortness of breath, PND or orthopnea.  She gets around  with a cane because of her transverse myelitis.   Past Medical History:  Diagnosis Date  . Adenocarcinoma (HCC)    LEFT LEG  . 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 Allergy   . Anxiety   . CAD (coronary artery disease)    a. minimal CAD by cath in 2016 with presumed spasm (20% mLAD, 20% D2).  . Cataract    bilateral cataracts removed  . Coronary artery spasm ([place])   . Diverticulosis of colon (without mention of hemorrhage)   . Femoral neck fracture, right, closed, initial encounter 10/04/2015  . GERD (gastroesophageal reflux disease)   . hepatic cyst   . History of nuclear stress test 04/2011   lexiscan; normal study, no significant ischemia, low risk; 2015 - normal  . HTN (hypertension)    PT. DENIES  . Hyperlipidemia   . Insomnia   . Irritable bowel syndrome   . Transverse myelitis ([place])   . Vitamin D deficiency     Past Surgical History:  Procedure Laterality Date  . ABDOMINAL HYSTERECTOMY Bilateral   . CARDIAC CATHETERIZATION     coronary spasm  . CARDIAC CATHETERIZATION N/A 08/28/2014   Procedure: Left Heart Cath and  Coronary Angiography;  Surgeon: Jettie Booze, MD;  Location: [facility];  Service: Cardiovascular;  Laterality: N/A;  . COLONOSCOPY    . DIAGNOSTIC LAPAROSCOPY    . LYSIS OF ADHESION    . PELVIC LAPAROSCOPY  1992   RSO, AND LSO ON 2007  . RESECTION SOFT TISSUE TUMOR LEG / ANKLE RADICAL  2004   leg lesion resection & radiation  . SALPINGOOPHORECTOMY Left   . TOTAL HIP ARTHROPLASTY Right 10/04/2015   Procedure: TOTAL HIP ARTHROPLASTY ANTERIOR APPROACH;  Surgeon: Rod Can, MD;  Location: [facility];  Service: Orthopedics;  Laterality: Right;  . TRANSTHORACIC ECHOCARDIOGRAM  07/2012   LV cavity size mildly reduced, normal wall motion; MV with calcified annulus and mild MR; LA mildly dilated; atrial septum with increased thickness - lipomatous hypertrophy; RV systolic pressure increased (borderline pulm HTN)  .  UPPER GASTROINTESTINAL ENDOSCOPY       Current Outpatient Medications  Medication Sig Dispense Refill  . ALPRAZolam (XANAX) 0.5 MG tablet Take 1 tablet (0.5 mg total) by mouth 3 (three) times daily. 90 tablet 5  . Calcium Citrate-Vitamin D (CALCIUM CITRATE + D3) 315-250 MG-UNIT TABS Take 2 tablets by mouth daily. 120 tablet   . cyclobenzaprine (FLEXERIL) 5 MG tablet Take 5 mg by mouth as needed.    . fluticasone (FLONASE) 50 MCG/ACT nasal spray Place 2 sprays into both nostrils daily. 16 g 9  . Hyoscyamine Sulfate SL (LEVSIN/SL) 0.125 MG SUBL Place 0.125 mg under the tongue every 4 (four) hours as needed (abd cramping and diarrhea). 120 tablet 5  . Iron-FA-B Cmp-C-Biot-Probiotic (FUSION PLUS) CAPS Take 1 capsule by mouth daily. 30 capsule 6  . isosorbide mononitrate (IMDUR) 120 MG 24 hr tablet Take 2 tablets (240 mg total) by mouth daily. 180 tablet 3  . methocarbamol (ROBAXIN) 500 MG tablet Take 1 tablet (500 mg total) by mouth 4 (four) times daily. 120 tablet 2  . nabumetone (RELAFEN) 500 MG tablet TAKE ONE TABLET BY MOUTH TWICE DAILY FOR MUSCLE & JOINT PAIN 180 tablet 1  . nitroGLYCERIN (NITROSTAT) 0.4 MG SL tablet DISSOLVE ONE TABLET UNDER TONGUE EVERY 5 MINUTES UP TO 3 DOSES AS NEEDED FOR CHEST PAIN 25 tablet 5  . pantoprazole (PROTONIX) 20 MG tablet TAKE TWO (2) TABLETS BY MOUTH IN THE MORNING AND TAKE ONE TABLET IN THE EVENING 90 tablet 7  . traMADol (ULTRAM) 50 MG tablet TAKE ONE TABLET BY MOUTH EVERY TWELVE HOURS AS NEEDED. 60 tablet 1  . triamterene-hydrochlorothiazide (DYAZIDE) 37.5-25 MG capsule TAKE ONE CAPSULE BY MOUTH 3 TIMES PER WEEK 30 capsule 5  . verapamil (CALAN-SR) 120 MG CR tablet TAKE ONE TABLET BY MOUTH AT BEDTIME 90 tablet 1   No current facility-administered medications for this visit.    Allergies:   Iodine, Amlodipine, Lexapro [escitalopram oxalate], Cymbalta [duloxetine hcl], Gabapentin, Ivp dye [iodinated diagnostic agents], and Zanaflex [tizanidine hcl]   ROS:   Please see the history of present illness.   Otherwise, review of systems are positive for none.   All other systems are reviewed and negative.    PHYSICAL EXAM: VS:  BP 128/70 (BP Location: Right Arm, Patient Position: Sitting, Cuff Size: Normal)   Pulse 71   Ht 5\' 3"  (1.6 m)   Wt 147 lb 3.2 oz (66.8 kg)   SpO2 99%   BMI 26.08 kg/m  , BMI Body mass index is 26.08 kg/m. GENERAL:  Well appearing NECK:  No jugular venous distention, waveform within normal limits, carotid  upstroke brisk and symmetric, no bruits, no thyromegaly LUNGS:  Clear to auscultation bilaterally CHEST:  Unremarkable HEART:  PMI not displaced or sustained,S1 and S2 within normal limits, no S3, no S4, no clicks, no rubs, no murmurs ABD:  Flat, positive bowel sounds normal in frequency in pitch, no bruits, no rebound, no guarding, no midline pulsatile mass, no hepatomegaly, no splenomegaly EXT:  2 plus pulses throughout, no edema, no cyanosis no clubbing   EKG:  EKG is not ordered today.    Recent Labs: 04/15/2020: ALT 12; BUN 12; Creatinine 0.80; Hemoglobin 11.1; Platelet Count 408; Potassium 4.3; Sodium 137    Lipid Panel    Component Value Date/Time   CHOL 192 06/18/2018 1637   CHOL 203 (H) 08/29/2012 1316   TRIG 116 06/18/2018 1637   TRIG 131 08/29/2012 1316   HDL 75 06/18/2018 1637   HDL 68 08/29/2012 1316   CHOLHDL 2.6 06/18/2018 1637   LDLCALC 94 06/18/2018 1637   LDLCALC 109 (H) 08/29/2012 1316      Wt Readings from Last 3 Encounters:  06/09/20 147 lb 3.2 oz (66.8 kg)  05/05/20 148 lb 6.4 oz (67.3 kg)  04/15/20 149 lb (67.6 kg)      Other studies Reviewed: Additional studies/ records that were reviewed today include: Labs. Review of the above records demonstrates:  Please see elsewhere in the note.     ASSESSMENT AND PLAN:   Coronary Spasm:     I do not think she is describing episodes consistent with her coronary spasm.  No change in therapy or further evaluation of nonischemic  etiology is indicated.   HTN: Her blood pressure is controlled.  No change in therapy.   Weakness:   Patient is having episodic weakness.  I am going to check a TSH and a 2-week event monitor.  I will see her back after this.  Current medicines are reviewed at length with the patient today.  The patient does not have concerns regarding medicines.  The following changes have been made: As above  Labs/ tests ordered today include:   Orders Placed This Encounter  Procedures  . TSH  . LONG TERM MONITOR (3-14 DAYS)     Disposition:   FU with 6 weeks in [place], Minus Breeding, MD  06/09/2020 12:24 PM    [facility]

## 2020-06-09 ENCOUNTER — Other Ambulatory Visit: Payer: Self-pay

## 2020-06-09 ENCOUNTER — Encounter: Payer: Self-pay | Admitting: Cardiology

## 2020-06-09 ENCOUNTER — Ambulatory Visit (INDEPENDENT_AMBULATORY_CARE_PROVIDER_SITE_OTHER): Payer: Medicare Other

## 2020-06-09 ENCOUNTER — Ambulatory Visit: Payer: Medicare Other | Admitting: Cardiology

## 2020-06-09 VITALS — BP 128/70 | HR 71 | Ht 63.0 in | Wt 147.2 lb

## 2020-06-09 DIAGNOSIS — I10 Essential (primary) hypertension: Secondary | ICD-10-CM | POA: Diagnosis not present

## 2020-06-09 DIAGNOSIS — R002 Palpitations: Secondary | ICD-10-CM

## 2020-06-09 DIAGNOSIS — R072 Precordial pain: Secondary | ICD-10-CM

## 2020-06-09 NOTE — Patient Instructions (Signed)
Medication Instructions:  Your Physician recommend you continue on your current medication as directed.    *If you need a refill on your cardiac medications before your next appointment, please call your pharmacy*   Lab Work: Your physician recommends lab work today (TSH).  If you have labs (blood work) drawn today and your tests are completely normal, you will receive your results only by: Marland Kitchen MyChart Message (if you have MyChart) OR . A paper copy in the mail If you have any lab test that is abnormal or we need to change your treatment, we will call you to review the results.   Testing/Procedures: Our physician has recommended that you wear an 14  DAY ZIO-PATCH monitor. The Zio patch cardiac monitor continuously records heart rhythm data for up to 14 days, this is for patients being evaluated for multiple types heart rhythms. For the first 24 hours post application, please avoid getting the Zio monitor wet in the shower or by excessive sweating during exercise. After that, feel free to carry on with regular activities. Keep soaps and lotions away from the ZIO XT Patch.   Someone from our office will call to verify address and mail monitor.     Follow-Up: At [facility], you and your health needs are our priority.  As part of our continuing mission to provide you with exceptional heart care, we have created designated Provider Care Teams.  These Care Teams include your primary Cardiologist (physician) and Advanced Practice Providers (APPs -  Physician Assistants and Nurse Practitioners) who all work together to provide you with the care you need, when you need it.  We recommend signing up for the patient portal called "MyChart".  Sign up information is provided on this After Visit Summary.  MyChart is used to connect with patients for Virtual Visits (Telemedicine).  Patients are able to view lab/test results, encounter notes, upcoming appointments, etc.  Non-urgent messages can be sent to  your provider as well.   To learn more about what you can do with MyChart, go to [email or web address].    Your next appointment:   6 week(s)  The format for your next appointment:   In Person  Provider:   Minus Breeding, MD in [place] Instructions   Your physician has requested you wear a ZIO patch monitor for _14__ days.  This is a single patch monitor.   [employer] supplies one patch monitor per enrollment. Additional stickers are not available. Please do not apply patch if you will be having a Nuclear Stress Test, Echocardiogram, Cardiac CT, MRI, or Chest Xray during the period you would be wearing the monitor. The patch cannot be worn during these tests. You cannot remove and re-apply the ZIO XT patch monitor.  Your ZIO patch monitor will be sent Fed Ex from [employer] directly to your home address. It may take 3-5 days to receive your monitor after you have been enrolled.  Once you have received your monitor, please review the enclosed instructions. Your monitor has already been registered assigning a specific monitor serial # to you.  Billing and Patient Assistance Program Information   We have supplied [employer] with any of your insurance information on file for billing purposes. [employer] offers a sliding scale Patient Assistance Program for patients that do not have insurance, or whose insurance does not completely cover the cost of the ZIO monitor.   You must apply for the Patient Assistance Program to qualify for this discounted rate.  To apply, please call [employer] at [phone], select option 4, then select option 2, and ask to apply for Patient Assistance Program.  Theodore Demark will ask your household income, and how many people are in your household.  They will quote your out-of-pocket cost based on that information.  [employer] will also be able to set up a 65-month, interest-free payment plan if needed.  Applying the monitor   Shave hair from  upper left chest.  Hold abrader disc by orange tab. Rub abrader in 40 strokes over the upper left chest as indicated in your monitor instructions.  Clean area with 4 enclosed alcohol pads. Let dry.  Apply patch as indicated in monitor instructions. Patch will be placed under collarbone on left side of chest with arrow pointing upward.  Rub patch adhesive wings for 2 minutes. Remove white label marked "1". Remove the white label marked "2". Rub patch adhesive wings for 2 additional minutes.  While looking in a mirror, press and release button in center of patch. A small green light will flash 3-4 times. This will be your only indicator that the monitor has been turned on. ?  Do not shower for the first 24 hours. You may shower after the first 24 hours.  Press the button if you feel a symptom. You will hear a small click. Record Date, Time and Symptom in the Patient Logbook.  When you are ready to remove the patch, follow instructions on the last 2 pages of the Patient Logbook. Stick patch monitor onto the last page of Patient Logbook.  Place Patient Logbook in the blue and white box.  Use locking tab on box and tape box closed securely.  The blue and white box has prepaid postage on it. Please place it in the mailbox as soon as possible. Your physician should have your test results approximately 7 days after the monitor has been mailed back to [facility].  Call [place] at [phone] if you have questions regarding your ZIO XT patch monitor. Call them immediately if you see an orange light blinking on your monitor.  If your monitor falls off in less than 4 days, contact our Monitor department at [phone]. ?If your monitor becomes loose or falls off after 4 days call [employer] at [phone] for suggestions on securing your monitor.?

## 2020-06-09 NOTE — Progress Notes (Unsigned)
Patient enrolled for [employer] to ship a 14 day ZIO XT long term holter monitor to her home. 

## 2020-06-12 DIAGNOSIS — R002 Palpitations: Secondary | ICD-10-CM | POA: Diagnosis not present

## 2020-06-24 DIAGNOSIS — H524 Presbyopia: Secondary | ICD-10-CM | POA: Diagnosis not present

## 2020-06-24 DIAGNOSIS — H35372 Puckering of macula, left eye: Secondary | ICD-10-CM | POA: Diagnosis not present

## 2020-06-24 DIAGNOSIS — H52203 Unspecified astigmatism, bilateral: Secondary | ICD-10-CM | POA: Diagnosis not present

## 2020-06-24 DIAGNOSIS — Z961 Presence of intraocular lens: Secondary | ICD-10-CM | POA: Diagnosis not present

## 2020-06-29 LAB — TSH: TSH: 1.72 u[IU]/mL (ref 0.450–4.500)

## 2020-06-30 ENCOUNTER — Ambulatory Visit (INDEPENDENT_AMBULATORY_CARE_PROVIDER_SITE_OTHER): Payer: Medicare Other | Admitting: Nurse Practitioner

## 2020-06-30 ENCOUNTER — Encounter: Payer: Self-pay | Admitting: Nurse Practitioner

## 2020-06-30 ENCOUNTER — Other Ambulatory Visit: Payer: Self-pay

## 2020-06-30 VITALS — BP 133/81 | HR 75 | Temp 97.4°F | Ht 63.0 in | Wt 151.0 lb

## 2020-06-30 DIAGNOSIS — G4489 Other headache syndrome: Secondary | ICD-10-CM

## 2020-06-30 NOTE — Patient Instructions (Signed)
General Headache Without Cause A headache is pain or discomfort that is felt around the head or neck area. There are many causes and types of headaches. In some cases, the cause may not be found. Follow these instructions at home: Watch your condition for any changes. Let your doctor know about them. Take these steps to help with your condition: Managing pain  Take over-the-counter and prescription medicines only as told by your doctor.  Lie down in a dark, quiet room when you have a headache.  If told, put ice on your head and neck area: ? Put ice in a plastic bag. ? Place a towel between your skin and the bag. ? Leave the ice on for 20 minutes, 2-3 times per day.  If told, put heat on the affected area. Use the heat source that your doctor recommends, such as a moist heat pack or a heating pad. ? Place a towel between your skin and the heat source. ? Leave the heat on for 20-30 minutes. ? Remove the heat if your skin turns bright red. This is very important if you are unable to feel pain, heat, or cold. You may have a greater risk of getting burned.  Keep lights dim if bright lights bother you or make your headaches worse.      Eating and drinking  Eat meals on a regular schedule.  If you drink alcohol: ? Limit how much you use to:  0-1 drink a day for women.  0-2 drinks a day for men. ? Be aware of how much alcohol is in your drink. In the [place] one drink equals one 12 oz bottle of beer (355 mL), one 5 oz glass of wine (148 mL), or one 1 oz glass of hard liquor (44 mL).  Stop drinking caffeine, or reduce how much caffeine you drink. General instructions  Keep a journal to find out if certain things bring on headaches. For example, write down: ? What you eat and drink. ? How much sleep you get. ? Any change to your diet or medicines.  Get a massage or try other ways to relax.  Limit stress.  Sit up straight. Do not tighten (tense) your muscles.  Do not use any  products that contain nicotine or tobacco. This includes cigarettes, e-cigarettes, and chewing tobacco. If you need help quitting, ask your doctor.  Exercise regularly as told by your doctor.  Get enough sleep. This often means 7-9 hours of sleep each night.  Keep all follow-up visits as told by your doctor. This is important.   Contact a doctor if:  Your symptoms are not helped by medicine.  You have a headache that feels different than the other headaches.  You feel sick to your stomach (nauseous) or you throw up (vomit).  You have a fever. Get help right away if:  Your headache gets very bad quickly.  Your headache gets worse after a lot of physical activity.  You keep throwing up.  You have a stiff neck.  You have trouble seeing.  You have trouble speaking.  You have pain in the eye or ear.  Your muscles are weak or you lose muscle control.  You lose your balance or have trouble walking.  You feel like you will pass out (faint) or you pass out.  You are mixed up (confused).  You have a seizure. Summary  A headache is pain or discomfort that is felt around the head or neck area.  There are many   causes and types of headaches. In some cases, the cause may not be found.  Keep a journal to help find out what causes your headaches. Watch your condition for any changes. Let your doctor know about them.  Contact a doctor if you have a headache that is different from usual, or if your headache is not helped by medicine.  Get help right away if your headache gets very bad, you throw up, you have trouble seeing, you lose your balance, or you have a seizure. This information is not intended to replace advice given to you by your health care provider. Make sure you discuss any questions you have with your health care provider. Document Revised: 08/13/2017 Document Reviewed: 08/13/2017 Elsevier Patient Education  2021 [employer]  

## 2020-06-30 NOTE — Progress Notes (Signed)
Acute Office Visit  Subjective:    Patient ID: Amy Black, female    DOB: [birth year], 80 y.o.   MRN: [id number]  Chief Complaint  Patient presents with  . Headache    Headache  This is a recurrent problem. The current episode started in the past 7 days. The problem occurs intermittently. The problem has been unchanged. The pain is located in the bilateral region. The pain does not radiate. The pain quality is similar to prior headaches. The quality of the pain is described as aching. The pain is at a severity of 4/10. The pain is mild. Pertinent negatives include no anorexia, blurred vision, coughing, fever, hearing loss, loss of balance or nausea. The symptoms are aggravated by unknown.     Past Medical History:  Diagnosis Date  . Adenocarcinoma (HCC)    LEFT LEG  . 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 Allergy   . Anxiety   . CAD (coronary artery disease)    a. minimal CAD by cath in 2016 with presumed spasm (20% mLAD, 20% D2).  . Cataract    bilateral cataracts removed  . Coronary artery spasm ([place])   . Diverticulosis of colon (without mention of hemorrhage)   . Femoral neck fracture, right, closed, initial encounter 10/04/2015  . GERD (gastroesophageal reflux disease)   . hepatic cyst   . History of nuclear stress test 04/2011   lexiscan; normal study, no significant ischemia, low risk; 2015 - normal  . HTN (hypertension)    PT. DENIES  . Hyperlipidemia   . Insomnia   . Irritable bowel syndrome   . Transverse myelitis ([place])   . Vitamin D deficiency     Past Surgical History:  Procedure Laterality Date  . ABDOMINAL HYSTERECTOMY Bilateral   . CARDIAC CATHETERIZATION     coronary spasm  . CARDIAC CATHETERIZATION N/A 08/28/2014   Procedure: Left Heart Cath and Coronary Angiography;  Surgeon: Jettie Booze, MD;  Location: [facility];  Service: Cardiovascular;  Laterality: N/A;  . COLONOSCOPY    . DIAGNOSTIC  LAPAROSCOPY    . LYSIS OF ADHESION    . PELVIC LAPAROSCOPY  1992   RSO, AND LSO ON 2007  . RESECTION SOFT TISSUE TUMOR LEG / ANKLE RADICAL  2004   leg lesion resection & radiation  . SALPINGOOPHORECTOMY Left   . TOTAL HIP ARTHROPLASTY Right 10/04/2015   Procedure: TOTAL HIP ARTHROPLASTY ANTERIOR APPROACH;  Surgeon: Rod Can, MD;  Location: [facility];  Service: Orthopedics;  Laterality: Right;  . TRANSTHORACIC ECHOCARDIOGRAM  07/2012   LV cavity size mildly reduced, normal wall motion; MV with calcified annulus and mild MR; LA mildly dilated; atrial septum with increased thickness - lipomatous hypertrophy; RV systolic pressure increased (borderline pulm HTN)  . UPPER GASTROINTESTINAL ENDOSCOPY      Family History  Problem Relation Age of Onset  . Bladder Cancer Mother   . Heart disease Mother   . Hypertension Mother   . Heart disease Father   . Stroke Father   . Diabetes Other        Multiple family members on both sides   . Coronary artery disease Other        Multiple family members on both sides   . Hyperlipidemia Sister   . Depression Sister   . Diabetes Brother   . Seizures Brother   . Hypertension Brother   . Colon cancer Neg Hx   .  Esophageal cancer Neg Hx   . Pancreatic cancer Neg Hx   . Rectal cancer Neg Hx   . Stomach cancer Neg Hx     Social History   Socioeconomic History  . Marital status: Married    Spouse name: Augustin Coupe  . Number of children: 2  . Years of education: 63  . Highest education level: High school graduate  Occupational History  . Occupation: Disabilty  . Occupation: DISABILITY    Employer: UNEMPLOYED  Tobacco Use  . Smoking status: Former Smoker    Packs/day: 0.50    Years: 4.00    Pack years: 2.00    Types: Cigarettes    Start date: 12/07/1968    Quit date: 11/20/1972    Years since quitting: 47.6  . Smokeless tobacco: Never Used  . Tobacco comment: quit 40 years ago  Vaping Use  . Vaping Use: Never used  Substance and Sexual  Activity  . Alcohol use: No    Alcohol/week: 0.0 standard drinks  . Drug use: No  . Sexual activity: Not Currently    Birth control/protection: Surgical  Other Topics Concern  . Not on file  Social History Narrative   Disabled, lives with husband and daughter.  Enjoys shopping.    Social Determinants of Health   Financial Resource Strain: Not on file  Food Insecurity: Not on file  Transportation Needs: Not on file  Physical Activity: Not on file  Stress: Not on file  Social Connections: Not on file  Intimate Partner Violence: Not on file    Outpatient Medications Prior to Visit  Medication Sig Dispense Refill  . ALPRAZolam (XANAX) 0.5 MG tablet Take 1 tablet (0.5 mg total) by mouth 3 (three) times daily. 90 tablet 5  . Calcium Citrate-Vitamin D (CALCIUM CITRATE + D3) 315-250 MG-UNIT TABS Take 2 tablets by mouth daily. 120 tablet   . cyclobenzaprine (FLEXERIL) 5 MG tablet Take 5 mg by mouth as needed.    . fluticasone (FLONASE) 50 MCG/ACT nasal spray Place 2 sprays into both nostrils daily. 16 g 9  . Hyoscyamine Sulfate SL (LEVSIN/SL) 0.125 MG SUBL Place 0.125 mg under the tongue every 4 (four) hours as needed (abd cramping and diarrhea). 120 tablet 5  . Iron-FA-B Cmp-C-Biot-Probiotic (FUSION PLUS) CAPS Take 1 capsule by mouth daily. 30 capsule 6  . isosorbide mononitrate (IMDUR) 120 MG 24 hr tablet Take 2 tablets (240 mg total) by mouth daily. 180 tablet 3  . methocarbamol (ROBAXIN) 500 MG tablet Take 1 tablet (500 mg total) by mouth 4 (four) times daily. 120 tablet 2  . nabumetone (RELAFEN) 500 MG tablet TAKE ONE TABLET BY MOUTH TWICE DAILY FOR MUSCLE & JOINT PAIN 180 tablet 1  . nitroGLYCERIN (NITROSTAT) 0.4 MG SL tablet DISSOLVE ONE TABLET UNDER TONGUE EVERY 5 MINUTES UP TO 3 DOSES AS NEEDED FOR CHEST PAIN 25 tablet 5  . pantoprazole (PROTONIX) 20 MG tablet TAKE TWO (2) TABLETS BY MOUTH IN THE MORNING AND TAKE ONE TABLET IN THE EVENING 90 tablet 7  . traMADol (ULTRAM) 50 MG  tablet TAKE ONE TABLET BY MOUTH EVERY TWELVE HOURS AS NEEDED. 60 tablet 1  . triamterene-hydrochlorothiazide (DYAZIDE) 37.5-25 MG capsule TAKE ONE CAPSULE BY MOUTH 3 TIMES PER WEEK 30 capsule 5  . verapamil (CALAN-SR) 120 MG CR tablet TAKE ONE TABLET BY MOUTH AT BEDTIME 90 tablet 1   No facility-administered medications prior to visit.    Allergies  Allergen Reactions  . Iodine Itching  . Amlodipine Other (See  Comments)    headaches  . Lexapro [Escitalopram Oxalate] Other (See Comments)    Drunk, High feeling  . Cymbalta [Duloxetine Hcl] Other (See Comments)    sleepiness  . Gabapentin Other (See Comments)    sleepiness  . Ivp Dye [Iodinated Diagnostic Agents] Itching  . Zanaflex [Tizanidine Hcl] Other (See Comments)    Very drunk feeling next day    Review of Systems  Constitutional: Negative for fever.  HENT: Negative for hearing loss.   Eyes: Negative for blurred vision.  Respiratory: Negative for cough.   Gastrointestinal: Negative for anorexia and nausea.  Neurological: Positive for headaches. Negative for loss of balance.  All other systems reviewed and are negative.      Objective:    Physical Exam Vitals and nursing note reviewed.  Constitutional:      Appearance: Normal appearance. She is well-developed.  HENT:     Head: Normocephalic.  Cardiovascular:     Rate and Rhythm: Normal rate and regular rhythm.  Pulmonary:     Effort: Pulmonary effort is normal.     Breath sounds: Normal breath sounds.  Abdominal:     General: Bowel sounds are normal.  Musculoskeletal:        General: Normal range of motion.  Skin:    General: Skin is warm.     Findings: No rash.  Neurological:     Mental Status: She is alert and oriented to person, place, and time.     Gait: Gait is intact.  Psychiatric:        Mood and Affect: Mood normal.        Behavior: Behavior normal.     BP 133/81   Pulse 75   Temp (!) 97.4 F (36.3 C) (Temporal)   Ht 5' 3" (1.6 m)   Wt  151 lb (68.5 kg)   SpO2 96%   BMI 26.75 kg/m  Wt Readings from Last 3 Encounters:  06/30/20 151 lb (68.5 kg)  06/09/20 147 lb 3.2 oz (66.8 kg)  05/05/20 148 lb 6.4 oz (67.3 kg)    Health Maintenance Due  Topic Date Due  . URINE MICROALBUMIN  Never done  . Hepatitis C Screening  Never done  . PAP SMEAR-Modifier  11/28/2011  . TETANUS/TDAP  02/07/2012  . DEXA SCAN  12/06/2017  . COVID-19 Vaccine (3 - Pfizer risk 4-dose series) 06/26/2019  . MAMMOGRAM  05/12/2020      Lab Results  Component Value Date   TSH 1.720 06/09/2020   Lab Results  Component Value Date   WBC 6.1 04/15/2020   HGB 11.1 (L) 04/15/2020   HCT 33.7 (L) 04/15/2020   MCV 95.7 04/15/2020   PLT 408 (H) 04/15/2020   Lab Results  Component Value Date   NA 137 04/15/2020   K 4.3 04/15/2020   CHLORIDE 102 01/28/2016   CO2 32 04/15/2020   GLUCOSE 99 04/15/2020   BUN 12 04/15/2020   CREATININE 0.80 04/15/2020   BILITOT 0.4 04/15/2020   ALKPHOS 88 04/15/2020   AST 16 04/15/2020   ALT 12 04/15/2020   PROT 7.4 04/15/2020   ALBUMIN 4.6 04/15/2020   CALCIUM 10.4 (H) 04/15/2020   ANIONGAP 5 04/15/2020   EGFR >90 01/28/2016   GFR 80.21 08/24/2014   Lab Results  Component Value Date   CHOL 192 06/18/2018   Lab Results  Component Value Date   HDL 75 06/18/2018   Lab Results  Component Value Date   LDLCALC 94 06/18/2018   Lab  Results  Component Value Date   TRIG 116 06/18/2018   Lab Results  Component Value Date   CHOLHDL 2.6 06/18/2018   No results found for: HGBA1C     Assessment & Plan:  Other headache syndrome This is not new for patient in the last months. Patient advised to continue on all medications as prescribed, Tylenol or ibuprofen for head ache.  follow up with worsening or unresolved symptoms  Problem List Items Addressed This Visit      Other   Other headache syndrome - Primary    This is not new for patient in the last months. Patient advised to continue on all  medications as prescribed, Tylenol or ibuprofen for head ache.  follow up with worsening or unresolved symptoms          No orders of the defined types were placed in this encounter.    Ivy Lynn, NP

## 2020-07-01 DIAGNOSIS — R002 Palpitations: Secondary | ICD-10-CM | POA: Diagnosis not present

## 2020-07-03 DIAGNOSIS — G4489 Other headache syndrome: Secondary | ICD-10-CM | POA: Insufficient documentation

## 2020-07-03 NOTE — Assessment & Plan Note (Signed)
This is not new for patient in the last months. Patient advised to continue on all medications as prescribed, Tylenol or ibuprofen for head ache.  follow up with worsening or unresolved symptoms

## 2020-07-09 ENCOUNTER — Encounter: Payer: Self-pay | Admitting: *Deleted

## 2020-07-13 DIAGNOSIS — K5900 Constipation, unspecified: Secondary | ICD-10-CM | POA: Diagnosis not present

## 2020-07-13 DIAGNOSIS — R197 Diarrhea, unspecified: Secondary | ICD-10-CM | POA: Diagnosis not present

## 2020-08-01 NOTE — Progress Notes (Addendum)
Cardiology Office Note   Date:  08/04/2020   ID:  Amy Black, DOB 08-22-1940, MRN [id number]  PCP:  [name], MD  Cardiologist:   Minus Breeding, MD   No chief complaint on file.     History of Present Illness: Amy Black is a 80 y.o. female who presents for a follow up of presumed coronary spasm. On 08/28/2014, she had left heart cath that showed minimal CAD, 20% disease in LAD was normal LVEF. She also had history of palpitations and hypertension. She has been on verapamil for greater than 25 years.  She has had recurrent chest pain and has been treated with increased doses of Imdur.  She had relief of her pain after being taken off of verapamil and started on Cardizem.   She was having presyncope when I saw her last.    She wore a monitor for 2 weeks and had runs of SVT that did not correlate with her episodes of weakness.  Since I last saw her she has had no further problems.  She is not getting any of the weak spells. The patient denies any new symptoms such as chest discomfort, neck or arm discomfort. There has been no new shortness of breath, PND or orthopnea. There have been no reported palpitations, presyncope or syncope.  She does her household shopping and some light chores.  She is no longer reporting any limitations with these.   Past Medical History:  Diagnosis Date   Adenocarcinoma ([place])    LEFT LEG   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 minimal CAD by cath in 2016 with presumed spasm (20% mLAD, 20% D2).   Cataract    bilateral cataracts removed   Coronary artery spasm (HCC)    Diverticulosis of colon (without mention of hemorrhage)    Femoral neck fracture, right, closed, initial encounter 10/04/2015   GERD (gastroesophageal reflux disease)    hepatic cyst    History of nuclear stress test 04/2011   lexiscan; normal study, no significant ischemia, low risk;  2015 - normal   HTN (hypertension)    PT. DENIES   Hyperlipidemia    Insomnia    Irritable bowel syndrome    Transverse myelitis ([place])    Vitamin D deficiency     Past Surgical History:  Procedure Laterality Date   ABDOMINAL HYSTERECTOMY Bilateral    CARDIAC CATHETERIZATION     coronary spasm   CARDIAC CATHETERIZATION N/A 08/28/2014   Procedure: Left Heart Cath and Coronary Angiography;  Surgeon: Jettie Booze, MD;  Location: [facility];  Service: Cardiovascular;  Laterality: N/A;   COLONOSCOPY     DIAGNOSTIC LAPAROSCOPY     LYSIS OF ADHESION     PELVIC LAPAROSCOPY  1992   RSO, AND LSO ON 2007   RESECTION SOFT TISSUE TUMOR LEG / ANKLE RADICAL  2004   leg lesion resection & radiation   SALPINGOOPHORECTOMY Left    TOTAL HIP ARTHROPLASTY Right 10/04/2015   Procedure: TOTAL HIP ARTHROPLASTY ANTERIOR APPROACH;  Surgeon: Rod Can, MD;  Location: [facility];  Service: Orthopedics;  Laterality: Right;   TRANSTHORACIC ECHOCARDIOGRAM  07/2012   LV cavity size mildly reduced, normal wall motion; MV with calcified annulus and mild MR; LA mildly dilated; atrial septum with increased thickness - lipomatous hypertrophy; RV systolic pressure increased (borderline pulm HTN)  UPPER GASTROINTESTINAL ENDOSCOPY       Current Outpatient Medications  Medication Sig Dispense Refill   ALPRAZolam (XANAX) 0.5 MG tablet Take 1 tablet (0.5 mg total) by mouth 3 (three) times daily. 90 tablet 5   Calcium Citrate-Vitamin D (CALCIUM CITRATE + D3) 315-250 MG-UNIT TABS Take 2 tablets by mouth daily. 120 tablet    cyclobenzaprine (FLEXERIL) 5 MG tablet Take 5 mg by mouth as needed.     fluticasone (FLONASE) 50 MCG/ACT nasal spray Place 2 sprays into both nostrils daily. 16 g 9   Hyoscyamine Sulfate SL (LEVSIN/SL) 0.125 MG SUBL Place 0.125 mg under the tongue every 4 (four) hours as needed (abd cramping and diarrhea). 120 tablet 5   Iron-FA-B Cmp-C-Biot-Probiotic (FUSION PLUS) CAPS Take 1 capsule by  mouth daily. 30 capsule 6   isosorbide mononitrate (IMDUR) 120 MG 24 hr tablet Take 2 tablets (240 mg total) by mouth daily. 180 tablet 3   methocarbamol (ROBAXIN) 500 MG tablet Take 1 tablet (500 mg total) by mouth 4 (four) times daily. 120 tablet 2   nitroGLYCERIN (NITROSTAT) 0.4 MG SL tablet DISSOLVE ONE TABLET UNDER TONGUE EVERY 5 MINUTES UP TO 3 DOSES AS NEEDED FOR CHEST PAIN 25 tablet 5   pantoprazole (PROTONIX) 20 MG tablet TAKE TWO (2) TABLETS BY MOUTH IN THE MORNING AND TAKE ONE TABLET IN THE EVENING 90 tablet 7   traMADol (ULTRAM) 50 MG tablet TAKE ONE TABLET BY MOUTH EVERY TWELVE HOURS AS NEEDED. 60 tablet 1   triamterene-hydrochlorothiazide (DYAZIDE) 37.5-25 MG capsule TAKE ONE CAPSULE BY MOUTH 3 TIMES PER WEEK 30 capsule 5   verapamil (CALAN-SR) 120 MG CR tablet TAKE ONE TABLET BY MOUTH AT BEDTIME 90 tablet 1   No current facility-administered medications for this visit.    Allergies:   Iodine, Amlodipine, Lexapro [escitalopram oxalate], Cymbalta [duloxetine hcl], Gabapentin, Ivp dye [iodinated diagnostic agents], and Zanaflex [tizanidine hcl]   ROS:  Please see the history of present illness.   Otherwise, review of systems are positive for none.   All other systems are reviewed and negative.    PHYSICAL EXAM: VS:  BP 124/80   Pulse 80   Ht 5\' 3"  (1.6 m)   Wt 151 lb (68.5 kg)   BMI 26.75 kg/m  , BMI Body mass index is 26.75 kg/m. GENERAL:  Well appearing NECK:  No jugular venous distention, waveform within normal limits, carotid upstroke brisk and symmetric, no bruits, no thyromegaly LUNGS:  Clear to auscultation bilaterally CHEST:  Unremarkable HEART:  PMI not displaced or sustained,S1 and S2 within normal limits, no S3, no S4, no clicks, no rubs, soft apical and right upper sternal brief systolic murmur, no diastolic murmurs ABD:  Flat, positive bowel sounds normal in frequency in pitch, no bruits, no rebound, no guarding, no midline pulsatile mass, no hepatomegaly, no  splenomegaly EXT:  2 plus pulses throughout, no edema, no cyanosis no clubbing   EKG:  EKG is not ordered today. NA   Recent Labs: 04/15/2020: ALT 12; BUN 12; Creatinine 0.80; Hemoglobin 11.1; Platelet Count 408; Potassium 4.3; Sodium 137 06/09/2020: TSH 1.720    Lipid Panel    Component Value Date/Time   CHOL 192 06/18/2018 1637   CHOL 203 (H) 08/29/2012 1316   TRIG 116 06/18/2018 1637   TRIG 131 08/29/2012 1316   HDL 75 06/18/2018 1637   HDL 68 08/29/2012 1316   CHOLHDL 2.6 06/18/2018 1637   LDLCALC 94 06/18/2018 1637   LDLCALC 109 (H) 08/29/2012 1316  Wt Readings from Last 3 Encounters:  08/04/20 151 lb (68.5 kg)  06/30/20 151 lb (68.5 kg)  06/09/20 147 lb 3.2 oz (66.8 kg)      Other studies Reviewed: Additional studies/ records that were reviewed today include: None. Review of the above records demonstrates:  NA  ASSESSMENT AND PLAN:    Coronary Spasm:    She is not having any chest discomfort.  She has not used nitroglycerin in months.  No change in therapy.  HTN:   Her blood pressure is well controlled.  Continue the meds as listed.  Weakness:   She is not having any weak spells.  Current medicines are reviewed at length with the patient today.  The patient does not have concerns regarding medicines.  The following changes have been made: None  Labs/ tests ordered today include:  None  No orders of the defined types were placed in this encounter.    Disposition:   FU with me as needed.    Signed, Minus Breeding, MD  08/04/2020 1:26 PM     [facility]

## 2020-08-04 ENCOUNTER — Encounter: Payer: Self-pay | Admitting: Cardiology

## 2020-08-04 ENCOUNTER — Ambulatory Visit: Payer: Medicare Other | Admitting: Cardiology

## 2020-08-04 ENCOUNTER — Other Ambulatory Visit: Payer: Self-pay

## 2020-08-04 VITALS — BP 124/80 | HR 80 | Ht 63.0 in | Wt 151.0 lb

## 2020-08-04 DIAGNOSIS — I10 Essential (primary) hypertension: Secondary | ICD-10-CM

## 2020-08-04 DIAGNOSIS — R42 Dizziness and giddiness: Secondary | ICD-10-CM | POA: Diagnosis not present

## 2020-08-04 NOTE — Patient Instructions (Signed)
Medication Instructions:  The current medical regimen is effective;  continue present plan and medications.  *If you need a refill on your cardiac medications before your next appointment, please call your pharmacy*  Follow-Up: At [facility], you and your health needs are our priority.  As part of our continuing mission to provide you with exceptional heart care, we have created designated Provider Care Teams.  These Care Teams include your primary Cardiologist (physician) and Advanced Practice Providers (APPs -  Physician Assistants and Nurse Practitioners) who all work together to provide you with the care you need, when you need it.  We recommend signing up for the patient portal called "MyChart".  Sign up information is provided on this After Visit Summary.  MyChart is used to connect with patients for Virtual Visits (Telemedicine).  Patients are able to view lab/test results, encounter notes, upcoming appointments, etc.  Non-urgent messages can be sent to your provider as well.   To learn more about what you can do with MyChart, go to [email or web address].    Follow up as needed with Dr Percival Spanish.  Thank you for choosing [facility]!!

## 2020-08-19 ENCOUNTER — Encounter: Payer: Self-pay | Admitting: Nurse Practitioner

## 2020-08-19 ENCOUNTER — Ambulatory Visit (INDEPENDENT_AMBULATORY_CARE_PROVIDER_SITE_OTHER): Payer: Medicare Other | Admitting: Nurse Practitioner

## 2020-08-19 ENCOUNTER — Other Ambulatory Visit: Payer: Self-pay

## 2020-08-19 DIAGNOSIS — J0110 Acute frontal sinusitis, unspecified: Secondary | ICD-10-CM | POA: Insufficient documentation

## 2020-08-19 MED ORDER — AMOXICILLIN-POT CLAVULANATE 875-125 MG PO TABS: 1.0000 | ORAL_TABLET | Freq: Two times a day (BID) | ORAL | 0 refills | Status: DC

## 2020-08-19 NOTE — Patient Instructions (Signed)

## 2020-08-19 NOTE — Progress Notes (Signed)
Acute Office Visit  Subjective:    Patient ID: Amy Black, female    DOB: 1940-08-17, 80 y.o.   MRN: [id number]  Chief Complaint  Patient presents with  . Sinusitis    Sinusitis This is a recurrent problem. The current episode started in the past 7 days. The problem is unchanged. There has been no fever. The pain is mild. Associated symptoms include chills and congestion. Pertinent negatives include no coughing, ear pain, sore throat or swollen glands. Past treatments include nothing. The treatment provided no relief.    Past Medical History:  Diagnosis Date  . Adenocarcinoma (HCC)    LEFT LEG  . 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 Allergy   . Anxiety   . CAD (coronary artery disease)    a. minimal CAD by cath in 2016 with presumed spasm (20% mLAD, 20% D2).  . Cataract    bilateral cataracts removed  . Coronary artery spasm ([place])   . Diverticulosis of colon (without mention of hemorrhage)   . Femoral neck fracture, right, closed, initial encounter 10/04/2015  . GERD (gastroesophageal reflux disease)   . hepatic cyst   . History of nuclear stress test 04/2011   lexiscan; normal study, no significant ischemia, low risk; 2015 - normal  . HTN (hypertension)    PT. DENIES  . Hyperlipidemia   . Insomnia   . Irritable bowel syndrome   . Transverse myelitis ([place])   . Vitamin D deficiency     Past Surgical History:  Procedure Laterality Date  . ABDOMINAL HYSTERECTOMY Bilateral   . CARDIAC CATHETERIZATION     coronary spasm  . CARDIAC CATHETERIZATION N/A 08/28/2014   Procedure: Left Heart Cath and Coronary Angiography;  Surgeon: Jettie Booze, MD;  Location: [facility];  Service: Cardiovascular;  Laterality: N/A;  . COLONOSCOPY    . DIAGNOSTIC LAPAROSCOPY    . LYSIS OF ADHESION    . PELVIC LAPAROSCOPY  1992   RSO, AND LSO ON 2007  . RESECTION SOFT TISSUE TUMOR LEG / ANKLE RADICAL  2004   leg lesion resection & radiation   . SALPINGOOPHORECTOMY Left   . TOTAL HIP ARTHROPLASTY Right 10/04/2015   Procedure: TOTAL HIP ARTHROPLASTY ANTERIOR APPROACH;  Surgeon: Rod Can, MD;  Location: [facility];  Service: Orthopedics;  Laterality: Right;  . TRANSTHORACIC ECHOCARDIOGRAM  07/2012   LV cavity size mildly reduced, normal wall motion; MV with calcified annulus and mild MR; LA mildly dilated; atrial septum with increased thickness - lipomatous hypertrophy; RV systolic pressure increased (borderline pulm HTN)  . UPPER GASTROINTESTINAL ENDOSCOPY      Family History  Problem Relation Age of Onset  . Bladder Cancer Mother   . Heart disease Mother   . Hypertension Mother   . Heart disease Father   . Stroke Father   . Diabetes Other        Multiple family members on both sides   . Coronary artery disease Other        Multiple family members on both sides   . Hyperlipidemia Sister   . Depression Sister   . Diabetes Brother   . Seizures Brother   . Hypertension Brother   . Colon cancer Neg Hx   . Esophageal cancer Neg Hx   . Pancreatic cancer Neg Hx   . Rectal cancer Neg Hx   . Stomach cancer Neg Hx     Social History   Socioeconomic History  .  Marital status: Married    Spouse name: Juel Burrow  . Number of children: 2  . Years of education: 71  . Highest education level: High school graduate  Occupational History  . Occupation: Disabilty  . Occupation: DISABILITY    Employer: UNEMPLOYED  Tobacco Use  . Smoking status: Former    Packs/day: 0.50    Years: 4.00    Pack years: 2.00    Types: Cigarettes    Start date: 12/07/1968    Quit date: 11/20/1972    Years since quitting: 47.7  . Smokeless tobacco: Never  . Tobacco comments:    quit 40 years ago  Vaping Use  . Vaping Use: Never used  Substance and Sexual Activity  . Alcohol use: No    Alcohol/week: 0.0 standard drinks  . Drug use: No  . Sexual activity: Not Currently    Birth control/protection: Surgical  Other Topics Concern  . Not on file   Social History Narrative   Disabled, lives with husband and daughter.  Enjoys shopping.    Social Determinants of Health   Financial Resource Strain: Not on file  Food Insecurity: Not on file  Transportation Needs: Not on file  Physical Activity: Not on file  Stress: Not on file  Social Connections: Not on file  Intimate Partner Violence: Not on file    Outpatient Medications Prior to Visit  Medication Sig Dispense Refill  . ALPRAZolam (XANAX) 0.5 MG tablet Take 1 tablet (0.5 mg total) by mouth 3 (three) times daily. 90 tablet 5  . Calcium Citrate-Vitamin D (CALCIUM CITRATE + D3) 315-250 MG-UNIT TABS Take 2 tablets by mouth daily. 120 tablet   . cyclobenzaprine (FLEXERIL) 5 MG tablet Take 5 mg by mouth as needed.    . fluticasone (FLONASE) 50 MCG/ACT nasal spray Place 2 sprays into both nostrils daily. 16 g 9  . Hyoscyamine Sulfate SL (LEVSIN/SL) 0.125 MG SUBL Place 0.125 mg under the tongue every 4 (four) hours as needed (abd cramping and diarrhea). 120 tablet 5  . Iron-FA-B Cmp-C-Biot-Probiotic (FUSION PLUS) CAPS Take 1 capsule by mouth daily. 30 capsule 6  . isosorbide mononitrate (IMDUR) 120 MG 24 hr tablet Take 2 tablets (240 mg total) by mouth daily. 180 tablet 3  . methocarbamol (ROBAXIN) 500 MG tablet Take 1 tablet (500 mg total) by mouth 4 (four) times daily. 120 tablet 2  . nitroGLYCERIN (NITROSTAT) 0.4 MG SL tablet DISSOLVE ONE TABLET UNDER TONGUE EVERY 5 MINUTES UP TO 3 DOSES AS NEEDED FOR CHEST PAIN 25 tablet 5  . pantoprazole (PROTONIX) 20 MG tablet TAKE TWO (2) TABLETS BY MOUTH IN THE MORNING AND TAKE ONE TABLET IN THE EVENING 90 tablet 7  . traMADol (ULTRAM) 50 MG tablet TAKE ONE TABLET BY MOUTH EVERY TWELVE HOURS AS NEEDED. 60 tablet 1  . triamterene-hydrochlorothiazide (DYAZIDE) 37.5-25 MG capsule TAKE ONE CAPSULE BY MOUTH 3 TIMES PER WEEK 30 capsule 5  . verapamil (CALAN-SR) 120 MG CR tablet TAKE ONE TABLET BY MOUTH AT BEDTIME 90 tablet 1   No facility-administered  medications prior to visit.    Allergies  Allergen Reactions  . Iodine Itching  . Amlodipine Other (See Comments)    headaches  . Lexapro [Escitalopram Oxalate] Other (See Comments)    Drunk, High feeling  . Cymbalta [Duloxetine Hcl] Other (See Comments)    sleepiness  . Gabapentin Other (See Comments)    sleepiness  . Ivp Dye [Iodinated Diagnostic Agents] Itching  . Zanaflex [Tizanidine Hcl] Other (See Comments)  Very drunk feeling next day    Review of Systems  Constitutional:  Positive for chills.  HENT:  Positive for congestion. Negative for ear pain and sore throat.   Eyes: Negative.   Respiratory:  Negative for cough.   Cardiovascular: Negative.   Gastrointestinal: Negative.   All other systems reviewed and are negative.     Objective:    Physical Exam Vitals and nursing note reviewed.  Constitutional:      Appearance: Normal appearance.  HENT:     Head: Normocephalic.     Nose: Congestion present.  Eyes:     Conjunctiva/sclera: Conjunctivae normal.  Cardiovascular:     Rate and Rhythm: Normal rate and regular rhythm.     Pulses: Normal pulses.     Heart sounds: Normal heart sounds.  Pulmonary:     Effort: Pulmonary effort is normal.     Breath sounds: Normal breath sounds.  Abdominal:     General: Bowel sounds are normal.  Musculoskeletal:     Cervical back: Normal range of motion.  Neurological:     Mental Status: She is alert.    There were no vitals taken for this visit. Wt Readings from Last 3 Encounters:  08/04/20 151 lb (68.5 kg)  06/30/20 151 lb (68.5 kg)  06/09/20 147 lb 3.2 oz (66.8 kg)    Health Maintenance Due  Topic Date Due  . URINE MICROALBUMIN  Never done  . Hepatitis C Screening  Never done  . PAP SMEAR-Modifier  11/28/2011  . TETANUS/TDAP  02/07/2012  . DEXA SCAN  12/06/2017  . COVID-19 Vaccine (3 - Pfizer risk series) 06/26/2019  . MAMMOGRAM  05/12/2020  . COLONOSCOPY (Pts 45-18yrs Insurance coverage will need to be  confirmed)  08/11/2020    There are no preventive care reminders to display for this patient.   Lab Results  Component Value Date   TSH 1.720 06/09/2020   Lab Results  Component Value Date   WBC 6.1 04/15/2020   HGB 11.1 (L) 04/15/2020   HCT 33.7 (L) 04/15/2020   MCV 95.7 04/15/2020   PLT 408 (H) 04/15/2020   Lab Results  Component Value Date   NA 137 04/15/2020   K 4.3 04/15/2020   CHLORIDE 102 01/28/2016   CO2 32 04/15/2020   GLUCOSE 99 04/15/2020   BUN 12 04/15/2020   CREATININE 0.80 04/15/2020   BILITOT 0.4 04/15/2020   ALKPHOS 88 04/15/2020   AST 16 04/15/2020   ALT 12 04/15/2020   PROT 7.4 04/15/2020   ALBUMIN 4.6 04/15/2020   CALCIUM 10.4 (H) 04/15/2020   ANIONGAP 5 04/15/2020   EGFR >90 01/28/2016   GFR 80.21 08/24/2014   Lab Results  Component Value Date   CHOL 192 06/18/2018   Lab Results  Component Value Date   HDL 75 06/18/2018   Lab Results  Component Value Date   LDLCALC 94 06/18/2018   Lab Results  Component Value Date   TRIG 116 06/18/2018   Lab Results  Component Value Date   CHOLHDL 2.6 06/18/2018   No results found for: HGBA1C     Assessment & Plan:   Problem List Items Addressed This Visit       Respiratory   Subacute frontal sinusitis - Primary    Unresolved signs and symptoms of sinusitis.  Patient reports symptoms started about 7 days ago with night chills, sore throat which is resolved today and congestion.  Patient continues to experience sinus pressure, and headache.  Completed COVID-19 swab  results pending.  Started patient on Augmentin 875 mg tablet by mouth daily.       Relevant Medications   amoxicillin-clavulanate (AUGMENTIN) 875-125 MG tablet   Other Relevant Orders   Novel Coronavirus, NAA ([facility])     Meds ordered this encounter  Medications  . amoxicillin-clavulanate (AUGMENTIN) 875-125 MG tablet    Sig: Take 1 tablet by mouth 2 (two) times daily.    Dispense:  14 tablet    Refill:  0    Order  Specific Question:   Supervising Provider    Answer:   Janora Norlander [[id number]]     Ivy Lynn, NP

## 2020-08-19 NOTE — Assessment & Plan Note (Signed)
Unresolved signs and symptoms of sinusitis.  Patient reports symptoms started about 7 days ago with night chills, sore throat which is resolved today and congestion.  Patient continues to experience sinus pressure, and headache.  Completed COVID-19 swab results pending.  Started patient on Augmentin 875 mg tablet by mouth daily.

## 2020-08-20 LAB — SARS-COV-2, NAA 2 DAY TAT

## 2020-08-20 LAB — NOVEL CORONAVIRUS, NAA: SARS-CoV-2, NAA: NOT DETECTED

## 2020-08-30 ENCOUNTER — Other Ambulatory Visit: Payer: Self-pay | Admitting: Cardiology

## 2020-08-30 DIAGNOSIS — I208 Other forms of angina pectoris: Secondary | ICD-10-CM

## 2020-09-28 ENCOUNTER — Encounter: Payer: Self-pay | Admitting: Family Medicine

## 2020-09-28 ENCOUNTER — Ambulatory Visit (INDEPENDENT_AMBULATORY_CARE_PROVIDER_SITE_OTHER): Payer: Medicare Other | Admitting: Family Medicine

## 2020-09-28 DIAGNOSIS — R519 Headache, unspecified: Secondary | ICD-10-CM

## 2020-09-28 NOTE — Progress Notes (Signed)
Subjective:    Patient ID: Amy Black, female    DOB: [birth year], 80 y.o.   MRN: [id number]   HPI: Amy Black is a 80 y.o. female presenting for ENT referral. Having a throb  on the left side of her head from the temple to the ear. DEscribes it as moderate. Accompanied by dizziness. Described as being off balance. Ongoing for 2 weeks. No relief with tylenol. Seen on 7/14 for sinusitis and took a course of augmentin without resolution.    Depression screen [facility] 2/9 08/19/2020 06/30/2020 05/05/2020 05/05/2020 10/30/2019  Decreased Interest 0 0 0 0 0  Down, Depressed, Hopeless 0 0 0 0 0  PHQ - 2 Score 0 0 0 0 0  Altered sleeping - - 2 - -  Tired, decreased energy - - 0 - -  Change in appetite - - 0 - -  Feeling bad or failure about yourself  - - 0 - -  Trouble concentrating - - 0 - -  Moving slowly or fidgety/restless - - 0 - -  Suicidal thoughts - - 0 - -  PHQ-9 Score - - 2 - -  Difficult doing work/chores - - Somewhat difficult - -  Some recent data might be hidden     Relevant past medical, surgical, family and social history reviewed and updated as indicated.  Interim medical history since our last visit reviewed. Allergies and medications reviewed and updated.  ROS:  Review of Systems  Constitutional: Negative.  Negative for activity change, appetite change and fever.  HENT:  Positive for congestion and ear pain.   Eyes:  Negative for visual disturbance.  Respiratory:  Positive for cough (at night). Negative for shortness of breath.   Cardiovascular:  Negative for chest pain.  Gastrointestinal:  Negative for abdominal pain.  Musculoskeletal:  Negative for arthralgias.  Neurological:  Positive for dizziness.    Social History   Tobacco Use  Smoking Status Former   Packs/day: 0.50   Years: 4.00   Pack years: 2.00   Types: Cigarettes   Start date: 12/07/1968   Quit date: 11/20/1972   Years since quitting: 47.8  Smokeless Tobacco Never  Tobacco Comments    quit 40 years ago       Objective:     Wt Readings from Last 3 Encounters:  08/04/20 151 lb (68.5 kg)  06/30/20 151 lb (68.5 kg)  06/09/20 147 lb 3.2 oz (66.8 kg)     Exam deferred. Pt. Harboring due to COVID 19. Phone visit performed.   Assessment & Plan:   1. Left facial pain     No orders of the defined types were placed in this encounter.   Orders Placed This Encounter  Procedures   Ambulatory referral to ENT    Referral Priority:   Routine    Referral Type:   Consultation    Referral Reason:   Specialty Services Required    Requested Specialty:   Otolaryngology    Number of Visits Requested:   1       Diagnoses and all orders for this visit:  Left facial pain -     Ambulatory referral to ENT   Virtual Visit via telephone Note  I discussed the limitations, risks, security and privacy concerns of performing an evaluation and management service by telephone and the availability of in person appointments. The patient was identified with two identifiers. Pt.expressed understanding and agreed to proceed. Pt. Is at home. Dr. Livia Snellen is in  his office.  Follow Up Instructions:   I discussed the assessment and treatment plan with the patient. The patient was provided an opportunity to ask questions and all were answered. The patient agreed with the plan and demonstrated an understanding of the instructions.   The patient was advised to call back or seek an in-person evaluation if the symptoms worsen or if the condition fails to improve as anticipated.   Total minutes phone contact time: 13   Follow up plan: Return if symptoms worsen or fail to improve.  [name], MD [place]

## 2020-09-29 ENCOUNTER — Telehealth: Payer: Self-pay

## 2020-09-29 NOTE — Telephone Encounter (Signed)
Patient saw Dr. Livia Snellen yesterday for pain in left side of head and dizziness.  Dr. Livia Snellen placed a referral to ENT.  Patient called this morning to let us know the pain has started in the top of her head now and is worse.  She feels like the pain in the left side of head has decreased some.  She wanted to make sure we are aware and would like to know if she needs to see ENT quicker or should she do something else.  She is still experiencing some dizziness, but no visual changes, etc.

## 2020-09-29 NOTE — Telephone Encounter (Signed)
If dizziness has worsened, or if she has weakness, changes in vision or speech- she should be seen right away. Otherwise she should follow up with ENT as discussed with [facility].

## 2020-09-29 NOTE — Telephone Encounter (Signed)
Patient aware and verbalized understanding. °

## 2020-10-05 ENCOUNTER — Other Ambulatory Visit: Payer: Self-pay | Admitting: Family Medicine

## 2020-10-05 DIAGNOSIS — F419 Anxiety disorder, unspecified: Secondary | ICD-10-CM

## 2020-10-06 DIAGNOSIS — Z0289 Encounter for other administrative examinations: Secondary | ICD-10-CM

## 2020-10-07 ENCOUNTER — Other Ambulatory Visit: Payer: Self-pay | Admitting: Family Medicine

## 2020-10-07 DIAGNOSIS — F419 Anxiety disorder, unspecified: Secondary | ICD-10-CM

## 2020-10-18 ENCOUNTER — Encounter: Payer: Self-pay | Admitting: Family

## 2020-10-18 ENCOUNTER — Telehealth: Payer: Self-pay

## 2020-10-18 ENCOUNTER — Inpatient Hospital Stay: Payer: Medicare Other | Attending: Family

## 2020-10-18 ENCOUNTER — Inpatient Hospital Stay (HOSPITAL_BASED_OUTPATIENT_CLINIC_OR_DEPARTMENT_OTHER): Payer: Medicare Other | Admitting: Family

## 2020-10-18 ENCOUNTER — Other Ambulatory Visit: Payer: Self-pay

## 2020-10-18 VITALS — BP 134/67 | HR 69 | Temp 98.0°F | Resp 18 | Ht 63.0 in | Wt 155.1 lb

## 2020-10-18 DIAGNOSIS — D631 Anemia in chronic kidney disease: Secondary | ICD-10-CM

## 2020-10-18 DIAGNOSIS — D500 Iron deficiency anemia secondary to blood loss (chronic): Secondary | ICD-10-CM | POA: Diagnosis not present

## 2020-10-18 DIAGNOSIS — C801 Malignant (primary) neoplasm, unspecified: Secondary | ICD-10-CM

## 2020-10-18 DIAGNOSIS — D75838 Other thrombocytosis: Secondary | ICD-10-CM | POA: Insufficient documentation

## 2020-10-18 DIAGNOSIS — Z859 Personal history of malignant neoplasm, unspecified: Secondary | ICD-10-CM | POA: Insufficient documentation

## 2020-10-18 DIAGNOSIS — D509 Iron deficiency anemia, unspecified: Secondary | ICD-10-CM | POA: Diagnosis not present

## 2020-10-18 LAB — RETICULOCYTES
Immature Retic Fract: 12.7 % (ref 2.3–15.9)
RBC.: 3.44 MIL/uL — ABNORMAL LOW (ref 3.87–5.11)
Retic Count, Absolute: 75.7 10*3/uL (ref 19.0–186.0)
Retic Ct Pct: 2.2 % (ref 0.4–3.1)

## 2020-10-18 LAB — FERRITIN: Ferritin: 158 ng/mL (ref 11–307)

## 2020-10-18 LAB — CMP (CANCER CENTER ONLY)
ALT: 10 U/L (ref 0–44)
AST: 14 U/L — ABNORMAL LOW (ref 15–41)
Albumin: 4.2 g/dL (ref 3.5–5.0)
Alkaline Phosphatase: 74 U/L (ref 38–126)
Anion gap: 6 (ref 5–15)
BUN: 13 mg/dL (ref 8–23)
CO2: 31 mmol/L (ref 22–32)
Calcium: 9.7 mg/dL (ref 8.9–10.3)
Chloride: 103 mmol/L (ref 98–111)
Creatinine: 0.84 mg/dL (ref 0.44–1.00)
GFR, Estimated: >60 mL/min (ref >60)
Glucose, Bld: 80 mg/dL (ref 70–99)
Potassium: 3.8 mmol/L (ref 3.5–5.1)
Sodium: 140 mmol/L (ref 135–145)
Total Bilirubin: 0.5 mg/dL (ref 0.3–1.2)
Total Protein: 6.8 g/dL (ref 6.5–8.1)

## 2020-10-18 LAB — CBC WITH DIFFERENTIAL (CANCER CENTER ONLY)
Abs Immature Granulocytes: 0.03 10*3/uL (ref 0.00–0.07)
Basophils Absolute: 0.1 10*3/uL (ref 0.0–0.1)
Basophils Relative: 1 %
Eosinophils Absolute: 0.1 10*3/uL (ref 0.0–0.5)
Eosinophils Relative: 3 %
HCT: 32.8 % — ABNORMAL LOW (ref 36.0–46.0)
Hemoglobin: 10.7 g/dL — ABNORMAL LOW (ref 12.0–15.0)
Immature Granulocytes: 1 %
Lymphocytes Relative: 38 %
Lymphs Abs: 1.9 10*3/uL (ref 0.7–4.0)
MCH: 31.6 pg (ref 26.0–34.0)
MCHC: 32.6 g/dL (ref 30.0–36.0)
MCV: 96.8 fL (ref 80.0–100.0)
Monocytes Absolute: 0.5 10*3/uL (ref 0.1–1.0)
Monocytes Relative: 9 %
Neutro Abs: 2.4 10*3/uL (ref 1.7–7.7)
Neutrophils Relative %: 48 %
Platelet Count: 336 10*3/uL (ref 150–400)
RBC: 3.39 MIL/uL — ABNORMAL LOW (ref 3.87–5.11)
RDW: 12.9 % (ref 11.5–15.5)
WBC Count: 4.9 10*3/uL (ref 4.0–10.5)
nRBC: 0 % (ref 0.0–0.2)

## 2020-10-18 LAB — IRON AND TIBC
Iron: 92 ug/dL (ref 28–170)
Saturation Ratios: 39 % — ABNORMAL HIGH (ref 10.4–31.8)
TIBC: 235 ug/dL — ABNORMAL LOW (ref 250–450)
UIBC: 143 ug/dL

## 2020-10-18 NOTE — Telephone Encounter (Signed)
Appt s made and printed for pt per 10/18/20 los  [employer]

## 2020-10-18 NOTE — Progress Notes (Signed)
Hematology and Oncology Follow Up Visit  Amy Starkovich Kuhrt [id number] Mar 02, 1940 80 y.o. 10/18/2020   Principle Diagnosis:  Transient thrombocytosis History transverse myelitis History of adenosquamous carcinoma of unknown primary Iron def anemia   Current Therapy:        Fusion Plus -- daily   Interim History:  Amy Black is here today for follow-up. She is doing well and has no complaints at this time. No fever, chills, n/v, cough, rash, dizziness, SOB, chest pain, palpitations, abdominal pain or changes in  bladder habits.  She has IBS and episodes of diarrhea.  She has occasional episodes of nausea but these seem to be random and not associated with food.  She has not noted any blood loss. No bruising or petechiae.  No swelling, tenderness, numbness or tingling in her extremities at this time. No falls or syncope to report. She ambulates with a cane.  She has maintained a good appetite and is staying well hydrated. Her weight is stable at 155 lbs.   ECOG Performance Status: 1 - Symptomatic but completely ambulatory  Medications:  Allergies as of 10/18/2020       Reactions   Iodine Itching   Amlodipine Other (See Comments)   headaches   Lexapro [escitalopram Oxalate] Other (See Comments)   Drunk, High feeling   Cymbalta [duloxetine Hcl] Other (See Comments)   sleepiness   Gabapentin Other (See Comments)   sleepiness   Ivp Dye [iodinated Diagnostic Agents] Itching   Zanaflex [tizanidine Hcl] Other (See Comments)   Very drunk feeling next day        Medication List        Accurate as of October 18, 2020  1:27 PM. If you have any questions, ask your nurse or doctor.          Allergy Relief 10 MG tablet Generic drug: loratadine Take 10 mg by mouth daily.   ALPRAZolam 0.5 MG tablet Commonly known as: XANAX Take 1 tablet (0.5 mg total) by mouth 3 (three) times daily.   amoxicillin-clavulanate 875-125 MG tablet Commonly known as: AUGMENTIN Take 1 tablet  by mouth 2 (two) times daily.   Calcium Citrate-Vitamin D 315-250 MG-UNIT Tabs Commonly known as: Calcium Citrate + D3 Take 2 tablets by mouth daily.   cyclobenzaprine 5 MG tablet Commonly known as: FLEXERIL Take 5 mg by mouth as needed.   fluticasone 50 MCG/ACT nasal spray Commonly known as: FLONASE Place 2 sprays into both nostrils daily.   Fusion Plus Caps Take 1 capsule by mouth daily.   Hyoscyamine Sulfate SL 0.125 MG Subl Commonly known as: Levsin/SL Place 0.125 mg under the tongue every 4 (four) hours as needed (abd cramping and diarrhea).   isosorbide mononitrate 120 MG 24 hr tablet Commonly known as: IMDUR TAKE TWO (2) TABLETS BY MOUTH ONCE DAILY   methocarbamol 500 MG tablet Commonly known as: ROBAXIN Take 1 tablet (500 mg total) by mouth 4 (four) times daily.   nitroGLYCERIN 0.4 MG SL tablet Commonly known as: NITROSTAT DISSOLVE ONE TABLET UNDER TONGUE EVERY 5 MINUTES UP TO 3 DOSES AS NEEDED FOR CHEST PAIN   pantoprazole 20 MG tablet Commonly known as: PROTONIX TAKE TWO (2) TABLETS BY MOUTH IN THE MORNING AND TAKE ONE TABLET IN THE EVENING   traMADol 50 MG tablet Commonly known as: ULTRAM TAKE ONE TABLET BY MOUTH EVERY TWELVE HOURS AS NEEDED.   triamterene-hydrochlorothiazide 37.5-25 MG capsule Commonly known as: DYAZIDE TAKE ONE CAPSULE BY MOUTH 3 TIMES PER WEEK  verapamil 120 MG CR tablet Commonly known as: CALAN-SR TAKE ONE TABLET BY MOUTH AT BEDTIME        Allergies:  Allergies  Allergen Reactions   Iodine Itching   Amlodipine Other (See Comments)    headaches   Lexapro [Escitalopram Oxalate] Other (See Comments)    Drunk, High feeling   Cymbalta [Duloxetine Hcl] Other (See Comments)    sleepiness   Gabapentin Other (See Comments)    sleepiness   Ivp Dye [Iodinated Diagnostic Agents] Itching   Zanaflex [Tizanidine Hcl] Other (See Comments)    Very drunk feeling next day    Past Medical History, Surgical history, Social history, and  Family History were reviewed and updated.  Review of Systems: All other 10 point review of systems is negative.   Physical Exam:  vitals were not taken for this visit.   Wt Readings from Last 3 Encounters:  08/04/20 151 lb (68.5 kg)  06/30/20 151 lb (68.5 kg)  06/09/20 147 lb 3.2 oz (66.8 kg)    Ocular: Sclerae unicteric, pupils equal, round and reactive to light Ear-nose-throat: Oropharynx clear, dentition fair Lymphatic: No cervical or supraclavicular adenopathy Lungs no rales or rhonchi, good excursion bilaterally Heart regular rate and rhythm, no murmur appreciated Abd soft, nontender, positive bowel sounds MSK no focal spinal tenderness, no joint edema Neuro: non-focal, well-oriented, appropriate affect Breasts: Deferred   Lab Results  Component Value Date   WBC 4.9 10/18/2020   HGB 10.7 (L) 10/18/2020   HCT 32.8 (L) 10/18/2020   MCV 96.8 10/18/2020   PLT 336 10/18/2020   Lab Results  Component Value Date   FERRITIN 165 04/15/2020   IRON 72 04/15/2020   TIBC 240 04/15/2020   UIBC 168 04/15/2020   IRONPCTSAT 30 04/15/2020   Lab Results  Component Value Date   RETICCTPCT 2.2 10/18/2020   RBC 3.39 (L) 10/18/2020   RBC 3.44 (L) 10/18/2020   No results found for: KPAFRELGTCHN, LAMBDASER, KAPLAMBRATIO No results found for: IGGSERUM, IGA, IGMSERUM No results found for: Odetta Pink, SPEI   Chemistry      Component Value Date/Time   NA 137 04/15/2020 1309   NA 139 01/27/2019 1053   NA 138 01/28/2016 1303   K 4.3 04/15/2020 1309   K 4.2 07/28/2016 1243   K 4.2 01/28/2016 1303   CL 100 04/15/2020 1309   CL 98 07/28/2016 1243   CL 101 07/20/2014 1131   CO2 32 04/15/2020 1309   CO2 29 07/28/2016 1243   CO2 30 (H) 01/28/2016 1303   BUN 12 04/15/2020 1309   BUN 15 01/27/2019 1053   BUN 10.0 01/28/2016 1303   CREATININE 0.80 04/15/2020 1309   CREATININE 0.94 07/28/2016 1243   CREATININE 0.8 01/28/2016  1303      Component Value Date/Time   CALCIUM 10.4 (H) 04/15/2020 1309   CALCIUM 9.9 07/28/2016 1243   CALCIUM 10.0 01/28/2016 1303   ALKPHOS 88 04/15/2020 1309   ALKPHOS 105 07/28/2016 1243   ALKPHOS 123 01/28/2016 1303   AST 16 04/15/2020 1309   AST 13 01/28/2016 1303   ALT 12 04/15/2020 1309   ALT 12 01/28/2016 1303   BILITOT 0.4 04/15/2020 1309   BILITOT 0.51 01/28/2016 1303       Impression and Plan: Amy Black is a very pleasant 80 yo African American female with history of transient thrombocytosis and also history of adenosquamous carcinoma with unknown primary.  So far she has done well  and there has been no evidence of recurrence.  Iron studies are pending. We will replace if needed.  Epo level also pending. Follow-up in 6 months. She can contact our office with any qestions or concerns.   Lottie Dawson, NP 9/12/20221:27 PM

## 2020-10-19 LAB — ERYTHROPOIETIN: Erythropoietin: 19.3 m[IU]/mL — ABNORMAL HIGH (ref 2.6–18.5)

## 2020-11-02 ENCOUNTER — Other Ambulatory Visit: Payer: Self-pay | Admitting: Family Medicine

## 2020-11-02 DIAGNOSIS — I10 Essential (primary) hypertension: Secondary | ICD-10-CM

## 2020-11-04 ENCOUNTER — Other Ambulatory Visit: Payer: Self-pay | Admitting: Family Medicine

## 2020-11-05 ENCOUNTER — Ambulatory Visit: Payer: Medicare Other | Admitting: Family Medicine

## 2020-11-10 DIAGNOSIS — K648 Other hemorrhoids: Secondary | ICD-10-CM | POA: Diagnosis not present

## 2020-11-10 DIAGNOSIS — D125 Benign neoplasm of sigmoid colon: Secondary | ICD-10-CM | POA: Diagnosis not present

## 2020-11-10 DIAGNOSIS — K5730 Diverticulosis of large intestine without perforation or abscess without bleeding: Secondary | ICD-10-CM | POA: Diagnosis not present

## 2020-11-10 DIAGNOSIS — R197 Diarrhea, unspecified: Secondary | ICD-10-CM | POA: Diagnosis not present

## 2020-11-12 DIAGNOSIS — D125 Benign neoplasm of sigmoid colon: Secondary | ICD-10-CM | POA: Diagnosis not present

## 2020-11-17 ENCOUNTER — Other Ambulatory Visit: Payer: Self-pay | Admitting: Family Medicine

## 2020-11-17 DIAGNOSIS — Z1231 Encounter for screening mammogram for malignant neoplasm of breast: Secondary | ICD-10-CM

## 2020-11-19 ENCOUNTER — Ambulatory Visit: Payer: Medicare Other | Admitting: Family Medicine

## 2020-11-23 ENCOUNTER — Other Ambulatory Visit: Payer: Self-pay

## 2020-11-23 ENCOUNTER — Encounter: Payer: Self-pay | Admitting: Family Medicine

## 2020-11-23 ENCOUNTER — Ambulatory Visit (INDEPENDENT_AMBULATORY_CARE_PROVIDER_SITE_OTHER): Payer: Medicare Other | Admitting: Family Medicine

## 2020-11-23 VITALS — BP 128/77 | HR 75 | Temp 98.2°F | Resp 20 | Ht 63.0 in | Wt 157.0 lb

## 2020-11-23 DIAGNOSIS — F419 Anxiety disorder, unspecified: Secondary | ICD-10-CM

## 2020-11-23 DIAGNOSIS — G373 Acute transverse myelitis in demyelinating disease of central nervous system: Secondary | ICD-10-CM

## 2020-11-23 DIAGNOSIS — I10 Essential (primary) hypertension: Secondary | ICD-10-CM | POA: Diagnosis not present

## 2020-11-23 DIAGNOSIS — M25561 Pain in right knee: Secondary | ICD-10-CM | POA: Diagnosis not present

## 2020-11-23 DIAGNOSIS — Z79899 Other long term (current) drug therapy: Secondary | ICD-10-CM | POA: Diagnosis not present

## 2020-11-23 DIAGNOSIS — G8929 Other chronic pain: Secondary | ICD-10-CM

## 2020-11-23 MED ORDER — TRAMADOL HCL 50 MG PO TABS: ORAL_TABLET | ORAL | 1 refills | Status: DC

## 2020-11-23 MED ORDER — VERAPAMIL HCL ER 120 MG PO TBCR: 120.0000 mg | EXTENDED_RELEASE_TABLET | Freq: Every day | ORAL | 3 refills | Status: DC

## 2020-11-23 MED ORDER — ALPRAZOLAM 0.5 MG PO TABS: 0.5000 mg | ORAL_TABLET | Freq: Three times a day (TID) | ORAL | 5 refills | Status: DC

## 2020-11-23 MED ORDER — NITROGLYCERIN 0.4 MG SL SUBL: SUBLINGUAL_TABLET | SUBLINGUAL | 11 refills | Status: DC

## 2020-11-23 MED ORDER — TRIAMTERENE-HCTZ 37.5-25 MG PO CAPS: ORAL_CAPSULE | ORAL | 3 refills | Status: DC

## 2020-11-23 NOTE — Progress Notes (Signed)
Subjective:  Patient ID: Amy Black, female    DOB: December 29, 1940  Age: 80 y.o. MRN: [id number]  CC: Medical Management of Chronic Issues (6 mo )   HPI Amy Black presents for  presents for  follow-up of hypertension. Patient has no history of headache chest pain or shortness of breath or recent cough. Patient also denies symptoms of TIA such as focal numbness or weakness. Patient denies side effects from medication. States taking it regularly.  Had a squeezing in her chest while watching TV in bed several days ago. Took two NTG and pain resolved in under 10 minutes. Substernal . No radiation. Broke out in a sweat. No dyspnea. No nausea. Sudden onset.   Colonoscopy Done 2 weeks ago. It came out okay. Mammo to be done on 11/30.   Patient in for follow-up of GERD. Currently asymptomatic taking  PPI daily. There is no chest pain or heartburn. No hematemesis and no melena. No dysphagia or choking. Onset is remote. Progression is stable. Complicating factors, none.   Depression screen [facility] 2/9 11/23/2020 08/19/2020 06/30/2020  Decreased Interest 0 0 0  Down, Depressed, Hopeless 0 0 0  PHQ - 2 Score 0 0 0  Altered sleeping - - -  Tired, decreased energy - - -  Change in appetite - - -  Feeling bad or failure about yourself  - - -  Trouble concentrating - - -  Moving slowly or fidgety/restless - - -  Suicidal thoughts - - -  PHQ-9 Score - - -  Difficult doing work/chores - - -  Some recent data might be hidden    History Amy Black has a past medical history of Adenocarcinoma ([place]), Adenosquamous carcinoma, Allergy, Anxiety, CAD (coronary artery disease), Cataract, Coronary artery spasm ([place]), Diverticulosis of colon (without mention of hemorrhage), Femoral neck fracture, right, closed, initial encounter (10/04/2015), GERD (gastroesophageal reflux disease), hepatic cyst, History of nuclear stress test (04/2011), HTN (hypertension), Hyperlipidemia, Insomnia, Irritable bowel syndrome,  Transverse myelitis ([place]), and Vitamin D deficiency.   Amy Black has a past surgical history that includes Resection soft tissue tumor leg / ankle radical (2004); Diagnostic laparoscopy; Lysis of adhesion; Salpingoophorectomy (Left); Pelvic laparoscopy (1992); Cardiac catheterization; transthoracic echocardiogram (07/2012); Cardiac catheterization (N/A, 08/28/2014); Colonoscopy; Upper gastrointestinal endoscopy; Total hip arthroplasty (Right, 10/04/2015); and Abdominal hysterectomy (Bilateral).   Her family history includes Bladder Cancer in her mother; Coronary artery disease in an other family member; Depression in her sister; Diabetes in her brother and another family member; Heart disease in her father and mother; Hyperlipidemia in her sister; Hypertension in her brother and mother; Seizures in her brother; Stroke in her father.Amy Black reports that Amy Black quit smoking about 48 years ago. Her smoking use included cigarettes. Amy Black started smoking about 51 years ago. Amy Black has a 2.00 pack-year smoking history. Amy Black has never used smokeless tobacco. Amy Black reports that Amy Black does not drink alcohol and does not use drugs.    ROS Review of Systems  Constitutional: Negative.   HENT: Negative.    Eyes:  Negative for visual disturbance.  Respiratory:  Negative for shortness of breath.   Cardiovascular:  Positive for chest pain.  Gastrointestinal:  Negative for abdominal pain.  Musculoskeletal:  Positive for arthralgias (chronic right knee pain. HAsn't responded to physical therapy).   Objective:  BP 128/77   Pulse 75   Temp 98.2 F (36.8 C)   Resp 20   Ht _0  (1.6 m)   Wt 157 lb (71.2 kg)   SpO2  97%   BMI 27.81 kg/m   BP Readings from Last 3 Encounters:  11/23/20 128/77  10/18/20 134/67  08/04/20 124/80    Wt Readings from Last 3 Encounters:  11/23/20 157 lb (71.2 kg)  10/18/20 155 lb 1.9 oz (70.4 kg)  08/04/20 151 lb (68.5 kg)     Physical Exam Constitutional:      General: Amy Black is not in acute  distress.    Appearance: Amy Black is well-developed.  HENT:     Head: Normocephalic and atraumatic.  Eyes:     Conjunctiva/sclera: Conjunctivae normal.     Pupils: Pupils are equal, round, and reactive to light.  Neck:     Thyroid: No thyromegaly.  Cardiovascular:     Rate and Rhythm: Normal rate and regular rhythm.     Heart sounds: Normal heart sounds. No murmur heard. Pulmonary:     Effort: Pulmonary effort is normal. No respiratory distress.     Breath sounds: Normal breath sounds. No wheezing or rales.  Abdominal:     General: Bowel sounds are normal. There is no distension.     Palpations: Abdomen is soft.     Tenderness: There is no abdominal tenderness.  Musculoskeletal:        General: Normal range of motion.     Cervical back: Normal range of motion and neck supple.  Lymphadenopathy:     Cervical: No cervical adenopathy.  Skin:    General: Skin is warm and dry.  Neurological:     Mental Status: Amy Black is alert and oriented to person, place, and time.  Psychiatric:        Behavior: Behavior normal.        Thought Content: Thought content normal.        Judgment: Judgment normal.      Assessment & Plan:   Vasiliki was seen today for medical management of chronic issues.  Diagnoses and all orders for this visit:  Controlled substance agreement signed -     ToxASSURE Select 13 (MW), Urine -     CMP14+EGFR  Primary hypertension  Transverse myelitis (HCC) -     traMADol (ULTRAM) 50 MG tablet; TAKE ONE TABLET BY MOUTH EVERY TWELVE HOURS AS NEEDED.  Anxiety -     ALPRAZolam (XANAX) 0.5 MG tablet; Take 1 tablet (0.5 mg total) by mouth 3 (three) times daily.  Essential hypertension -     triamterene-hydrochlorothiazide (DYAZIDE) 37.5-25 MG capsule; TAKE ONE CAPSULE BY MOUTH 3 TIMES PER WEEK -     verapamil (CALAN-SR) 120 MG CR tablet; Take 1 tablet (120 mg total) by mouth at bedtime.  Chronic pain of right knee -     Ambulatory referral to Orthopedics  Other  orders -     nitroGLYCERIN (NITROSTAT) 0.4 MG SL tablet; DISSOLVE ONE TABLET UNDER TONGUE EVERY 5 MINUTES UP TO 3 DOSES AS NEEDED FOR CHEST PAIN      I have changed Juliann P. Tooker's verapamil. I am also having her maintain her Calcium Citrate-Vitamin D, fluticasone, Fusion Plus, Hyoscyamine Sulfate SL, isosorbide mononitrate, Allergy Relief, cyclobenzaprine, traMADol, ALPRAZolam, triamterene-hydrochlorothiazide, and nitroGLYCERIN.  Allergies as of 11/23/2020       Reactions   Iodine Itching   Amlodipine Other (See Comments)   headaches   Lexapro [escitalopram Oxalate] Other (See Comments)   Drunk, High feeling   Cymbalta [duloxetine Hcl] Other (See Comments)   sleepiness   Gabapentin Other (See Comments)   sleepiness   Ivp Dye [iodinated Diagnostic Agents] Itching  Zanaflex [tizanidine Hcl] Other (See Comments)   Very drunk feeling next day        Medication List        Accurate as of November 23, 2020  3:54 PM. If you have any questions, ask your nurse or doctor.          Allergy Relief 10 MG tablet Generic drug: loratadine Take 10 mg by mouth daily.   ALPRAZolam 0.5 MG tablet Commonly known as: XANAX Take 1 tablet (0.5 mg total) by mouth 3 (three) times daily.   Calcium Citrate-Vitamin D 315-250 MG-UNIT Tabs Commonly known as: Calcium Citrate + D3 Take 2 tablets by mouth daily.   cyclobenzaprine 5 MG tablet Commonly known as: FLEXERIL TAKE ONE TABLET BY MOUTH THREE TIMES DAILY AS NEEDED FOR MUSCLE SPASMS.   fluticasone 50 MCG/ACT nasal spray Commonly known as: FLONASE Place 2 sprays into both nostrils daily.   Fusion Plus Caps Take 1 capsule by mouth daily.   Hyoscyamine Sulfate SL 0.125 MG Subl Commonly known as: Levsin/SL Place 0.125 mg under the tongue every 4 (four) hours as needed (abd cramping and diarrhea).   isosorbide mononitrate 120 MG 24 hr tablet Commonly known as: IMDUR TAKE TWO (2) TABLETS BY MOUTH ONCE DAILY   nitroGLYCERIN 0.4  MG SL tablet Commonly known as: NITROSTAT DISSOLVE ONE TABLET UNDER TONGUE EVERY 5 MINUTES UP TO 3 DOSES AS NEEDED FOR CHEST PAIN   traMADol 50 MG tablet Commonly known as: ULTRAM TAKE ONE TABLET BY MOUTH EVERY TWELVE HOURS AS NEEDED.   triamterene-hydrochlorothiazide 37.5-25 MG capsule Commonly known as: DYAZIDE TAKE ONE CAPSULE BY MOUTH 3 TIMES PER WEEK   verapamil 120 MG CR tablet Commonly known as: CALAN-SR Take 1 tablet (120 mg total) by mouth at bedtime.         Follow-up: Return in about 6 months (around 05/24/2021).  [name], M.D.

## 2020-11-24 DIAGNOSIS — H903 Sensorineural hearing loss, bilateral: Secondary | ICD-10-CM | POA: Diagnosis not present

## 2020-11-24 DIAGNOSIS — H9313 Tinnitus, bilateral: Secondary | ICD-10-CM | POA: Diagnosis not present

## 2020-11-24 DIAGNOSIS — H608X3 Other otitis externa, bilateral: Secondary | ICD-10-CM | POA: Diagnosis not present

## 2020-11-24 LAB — CMP14+EGFR
ALT: 10 IU/L (ref 0–32)
AST: 17 IU/L (ref 0–40)
Albumin/Globulin Ratio: 1.9 (ref 1.2–2.2)
Albumin: 4.5 g/dL (ref 3.7–4.7)
Alkaline Phosphatase: 98 IU/L (ref 44–121)
BUN/Creatinine Ratio: 11 — ABNORMAL LOW (ref 12–28)
BUN: 9 mg/dL (ref 8–27)
Bilirubin Total: 0.2 mg/dL (ref 0.0–1.2)
CO2: 26 mmol/L (ref 20–29)
Calcium: 9.7 mg/dL (ref 8.7–10.3)
Chloride: 100 mmol/L (ref 96–106)
Creatinine, Ser: 0.79 mg/dL (ref 0.57–1.00)
Globulin, Total: 2.4 g/dL (ref 1.5–4.5)
Glucose: 93 mg/dL (ref 70–99)
Potassium: 4.7 mmol/L (ref 3.5–5.2)
Sodium: 139 mmol/L (ref 134–144)
Total Protein: 6.9 g/dL (ref 6.0–8.5)
eGFR: 77 mL/min/{1.73_m2} (ref >59)

## 2020-11-24 NOTE — Progress Notes (Signed)
Hello Renaye,  Your lab result is normal and/or stable.Some minor variations that are not significant are commonly marked abnormal, but do not represent any medical problem for you.  Best regards, Denaly Gatling, M.D.

## 2020-11-29 LAB — TOXASSURE SELECT 13 (MW), URINE

## 2021-01-03 DIAGNOSIS — R531 Weakness: Secondary | ICD-10-CM | POA: Insufficient documentation

## 2021-01-03 NOTE — Progress Notes (Signed)
Cardiology Office Note   Date:  01/05/2021   ID:  Amy Black, DOB 1940-09-09, MRN [id number]  PCP:  [name], MD  Cardiologist:   Minus Breeding, MD   Chief Complaint  Patient presents with   Shoulder Pain       History of Present Illness: Amy Black is a 80 y.o. female who presents for a follow up of presumed coronary spasm. On 08/28/2014, she had left heart cath that showed minimal CAD, 20% disease in LAD was normal LVEF. She also had history of palpitations and hypertension. She has been on verapamil for greater than 25 years.  She has had recurrent chest pain and has been treated with increased doses of Imdur.  She had relief of her pain after being taken off of verapamil and started on Cardizem.   She was having presyncope.   She wore a monitor for 2 weeks and had runs of SVT that did not correlate with her episodes of weakness.    Since I last saw her she called because she had some left arm pain.  This happened 3 or 4 weeks ago.  It is happening in the evening.  She had a sharp pain from her shoulder down to her mid arm.  Tramadol and also nitroglycerin and it went away.  Came back days later and she did the same thing.  She said it might be similar to what she has had previously.  However, she said its not happening since.  She is not able to bring it on.  She gets around with her cane because of decreased balance following transverse myelitis.  She says with her activities such as grocery shopping she does not bring on the symptoms.  She does not have shortness of breath, PND or orthopnea.  She definitely said she would not want to go to the hospital with all the "germs" around these days.   Past Medical History:  Diagnosis Date   Adenocarcinoma ([place])    LEFT LEG   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 minimal CAD by cath in 2016 with presumed spasm (20% mLAD, 20% D2).    Cataract    bilateral cataracts removed   Coronary artery spasm (HCC)    Diverticulosis of colon (without mention of hemorrhage)    Femoral neck fracture, right, closed, initial encounter 10/04/2015   GERD (gastroesophageal reflux disease)    hepatic cyst    History of nuclear stress test 04/2011   lexiscan; normal study, no significant ischemia, low risk; 2015 - normal   HTN (hypertension)    PT. DENIES   Hyperlipidemia    Insomnia    Irritable bowel syndrome    Transverse myelitis ([place])    Vitamin D deficiency     Past Surgical History:  Procedure Laterality Date   ABDOMINAL HYSTERECTOMY Bilateral    CARDIAC CATHETERIZATION     coronary spasm   CARDIAC CATHETERIZATION N/A 08/28/2014   Procedure: Left Heart Cath and Coronary Angiography;  Surgeon: Jettie Booze, MD;  Location: [facility];  Service: Cardiovascular;  Laterality: N/A;   COLONOSCOPY     DIAGNOSTIC LAPAROSCOPY     LYSIS OF ADHESION     PELVIC LAPAROSCOPY  1992   RSO, AND LSO ON 2007   RESECTION SOFT TISSUE TUMOR LEG / ANKLE RADICAL  2004  leg lesion resection & radiation   SALPINGOOPHORECTOMY Left    TOTAL HIP ARTHROPLASTY Right 10/04/2015   Procedure: TOTAL HIP ARTHROPLASTY ANTERIOR APPROACH;  Surgeon: Rod Can, MD;  Location: [facility];  Service: Orthopedics;  Laterality: Right;   TRANSTHORACIC ECHOCARDIOGRAM  07/2012   LV cavity size mildly reduced, normal wall motion; MV with calcified annulus and mild MR; LA mildly dilated; atrial septum with increased thickness - lipomatous hypertrophy; RV systolic pressure increased (borderline pulm HTN)   UPPER GASTROINTESTINAL ENDOSCOPY       Current Outpatient Medications  Medication Sig Dispense Refill   ALLERGY RELIEF 10 MG tablet Take 10 mg by mouth daily.     ALPRAZolam (XANAX) 0.5 MG tablet Take 1 tablet (0.5 mg total) by mouth 3 (three) times daily. 90 tablet 5   cyclobenzaprine (FLEXERIL) 5 MG tablet TAKE ONE TABLET BY MOUTH THREE TIMES DAILY  AS NEEDED FOR MUSCLE SPASMS. 90 tablet 5   fluticasone (FLONASE) 50 MCG/ACT nasal spray Place 2 sprays into both nostrils daily. 16 g 9   Hyoscyamine Sulfate SL (LEVSIN/SL) 0.125 MG SUBL Place 0.125 mg under the tongue every 4 (four) hours as needed (abd cramping and diarrhea). 120 tablet 5   Iron-FA-B Cmp-C-Biot-Probiotic (FUSION PLUS) CAPS Take 1 capsule by mouth daily. 30 capsule 6   isosorbide mononitrate (IMDUR) 120 MG 24 hr tablet TAKE TWO (2) TABLETS BY MOUTH ONCE DAILY 180 tablet 3   nitroGLYCERIN (NITROSTAT) 0.4 MG SL tablet DISSOLVE ONE TABLET UNDER TONGUE EVERY 5 MINUTES UP TO 3 DOSES AS NEEDED FOR CHEST PAIN 25 tablet 11   traMADol (ULTRAM) 50 MG tablet TAKE ONE TABLET BY MOUTH EVERY TWELVE HOURS AS NEEDED. 60 tablet 1   triamterene-hydrochlorothiazide (DYAZIDE) 37.5-25 MG capsule TAKE ONE CAPSULE BY MOUTH 3 TIMES PER WEEK 90 capsule 3   verapamil (CALAN-SR) 120 MG CR tablet Take 1 tablet (120 mg total) by mouth at bedtime. 90 tablet 3   Calcium Citrate-Vitamin D (CALCIUM CITRATE + D3) 315-250 MG-UNIT TABS Take 2 tablets by mouth daily. (Patient not taking: Reported on 01/05/2021) 120 tablet    No current facility-administered medications for this visit.    Allergies:   Iodine, Amlodipine, Lexapro [escitalopram oxalate], Cymbalta [duloxetine hcl], Gabapentin, Ivp dye [iodinated diagnostic agents], and Zanaflex [tizanidine hcl]   ROS:  Please see the history of present illness.   Otherwise, review of systems are positive for none.   All other systems are reviewed and negative.    PHYSICAL EXAM: VS:  BP 132/74   Pulse 69   Ht 5\' 3"  (1.6 m)   Wt 153 lb (69.4 kg)   BMI 27.10 kg/m  , BMI Body mass index is 27.1 kg/m. GENERAL:  Well appearing NECK:  No jugular venous distention, waveform within normal limits, carotid upstroke brisk and symmetric, no bruits, no thyromegaly LUNGS:  Clear to auscultation bilaterally CHEST:  Unremarkable HEART:  PMI not displaced or sustained,S1 and  S2 within normal limits, no S3, no S4, no clicks, no rubs, no murmurs ABD:  Flat, positive bowel sounds normal in frequency in pitch, no bruits, no rebound, no guarding, no midline pulsatile mass, no hepatomegaly, no splenomegaly EXT:  2 plus pulses throughout, no edema, no cyanosis no clubbing   EKG:  EKG is  ordered today. Sinus rhythm, rate 69, axis within normal limits, intervals within normal limits, no acute ST-T wave changes.   Recent Labs: 06/09/2020: TSH 1.720 10/18/2020: Hemoglobin 10.7; Platelet Count 336 11/23/2020: ALT 10; BUN 9; Creatinine,  Ser 0.79; Potassium 4.7; Sodium 139    Lipid Panel    Component Value Date/Time   CHOL 192 06/18/2018 1637   CHOL 203 (H) 08/29/2012 1316   TRIG 116 06/18/2018 1637   TRIG 131 08/29/2012 1316   HDL 75 06/18/2018 1637   HDL 68 08/29/2012 1316   CHOLHDL 2.6 06/18/2018 1637   LDLCALC 94 06/18/2018 1637   LDLCALC 109 (H) 08/29/2012 1316      Wt Readings from Last 3 Encounters:  01/05/21 153 lb (69.4 kg)  11/23/20 157 lb (71.2 kg)  10/18/20 155 lb 1.9 oz (70.4 kg)      Other studies Reviewed: Additional studies/ records that were reviewed today include: Labs. Review of the above records demonstrates: See elsewhere  ASSESSMENT AND PLAN:    Coronary Spasm:    Her arm pain could have been coronary spasm or something else but she thinks it is fairly stable and she does not want further hospital or invasive evaluation.  She promises that if it does get worse she would let me know and I probably would order a coronary CTA.  She had nonobstructive disease in 2016.   HTN:   Her blood pressure is well controlled.  No change in therapy.   Current medicines are reviewed at length with the patient today.  The patient does not have concerns regarding medicines.  The following changes have been made: None  Labs/ tests ordered today include:   None  Orders Placed This Encounter  Procedures   EKG 12-Lead      Disposition:   FU  with me 12 months. Ronnell Guadalajara, MD  01/05/2021 4:31 PM    [facility]

## 2021-01-05 ENCOUNTER — Ambulatory Visit
Admission: RE | Admit: 2021-01-05 | Discharge: 2021-01-05 | Disposition: A | Discharge status: Home / Self Care | Payer: Medicare Other | Source: Ambulatory Visit | Attending: Family Medicine | Admitting: Family Medicine

## 2021-01-05 ENCOUNTER — Encounter: Payer: Self-pay | Admitting: Cardiology

## 2021-01-05 ENCOUNTER — Other Ambulatory Visit: Payer: Self-pay

## 2021-01-05 ENCOUNTER — Ambulatory Visit (INDEPENDENT_AMBULATORY_CARE_PROVIDER_SITE_OTHER): Payer: Medicare Other | Admitting: Cardiology

## 2021-01-05 VITALS — BP 132/74 | HR 69 | Ht 63.0 in | Wt 153.0 lb

## 2021-01-05 DIAGNOSIS — Z1231 Encounter for screening mammogram for malignant neoplasm of breast: Secondary | ICD-10-CM | POA: Diagnosis not present

## 2021-01-05 DIAGNOSIS — R531 Weakness: Secondary | ICD-10-CM

## 2021-01-05 DIAGNOSIS — I10 Essential (primary) hypertension: Secondary | ICD-10-CM

## 2021-01-05 DIAGNOSIS — I25111 Atherosclerotic heart disease of native coronary artery with angina pectoris with documented spasm: Secondary | ICD-10-CM | POA: Diagnosis not present

## 2021-01-05 NOTE — Patient Instructions (Signed)
Medication Instructions:  The current medical regimen is effective;  continue present plan and medications.  *If you need a refill on your cardiac medications before your next appointment, please call your pharmacy*  Follow-Up: At [facility], you and your health needs are our priority.  As part of our continuing mission to provide you with exceptional heart care, we have created designated Provider Care Teams.  These Care Teams include your primary Cardiologist (physician) and Advanced Practice Providers (APPs -  Physician Assistants and Nurse Practitioners) who all work together to provide you with the care you need, when you need it.  We recommend signing up for the patient portal called "MyChart".  Sign up information is provided on this After Visit Summary.  MyChart is used to connect with patients for Virtual Visits (Telemedicine).  Patients are able to view lab/test results, encounter notes, upcoming appointments, etc.  Non-urgent messages can be sent to your provider as well.   To learn more about what you can do with MyChart, go to [email or web address].    Your next appointment:   1 year(s)  The format for your next appointment:   In Person  Provider:   James Hochrein, MD   Thank you for choosing [facility]!!    

## 2021-01-10 ENCOUNTER — Ambulatory Visit (INDEPENDENT_AMBULATORY_CARE_PROVIDER_SITE_OTHER): Payer: Medicare Other | Admitting: Family Medicine

## 2021-01-10 ENCOUNTER — Encounter: Payer: Self-pay | Admitting: Family Medicine

## 2021-01-10 VITALS — BP 119/70 | HR 77 | Temp 96.9°F | Ht 63.0 in | Wt 157.0 lb

## 2021-01-10 DIAGNOSIS — H6592 Unspecified nonsuppurative otitis media, left ear: Secondary | ICD-10-CM

## 2021-01-10 MED ORDER — SULFAMETHOXAZOLE-TRIMETHOPRIM 800-160 MG PO TABS: 1.0000 | ORAL_TABLET | Freq: Two times a day (BID) | ORAL | 0 refills | Status: DC

## 2021-01-10 NOTE — Progress Notes (Signed)
Chief Complaint  Patient presents with   Otalgia    Left ear pain    HPI  Patient presents today for Patient presents with upper respiratory congestion. No Rhinorrhea or sore throat.  There is no cough & no fever, chills or sweats. The patient denies being short of breath. Onset was 2 days ago. Gradually worsening. Left ear hurts, but hearing remains normal.    PMH: Smoking status noted ROS: Per HPI  Objective: BP 119/70   Pulse 77   Temp (!) 96.9 F (36.1 C)   Ht 5\' 3"  (1.6 m)   Wt 157 lb (71.2 kg)   SpO2 100%   BMI 27.81 kg/m  Gen: NAD, alert, cooperative with exam HEENT: NCAT, Nasal passages swollen, Left TM has fluid. Right is clear.  CV: RRR, good S1/S2, no murmur Resp: CTA Ext: No edema, warm Neuro: Alert and oriented, No gross deficits  Assessment and plan:  1. Left otitis media with effusion     Meds ordered this encounter  Medications   sulfamethoxazole-trimethoprim (BACTRIM DS) 800-160 MG tablet    Sig: Take 1 tablet by mouth 2 (two) times daily. Until gone, for infection    Dispense:  20 tablet    Refill:  0     No orders of the defined types were placed in this encounter.   Follow up as needed.  [name], MD

## 2021-02-14 ENCOUNTER — Ambulatory Visit (INDEPENDENT_AMBULATORY_CARE_PROVIDER_SITE_OTHER): Payer: Medicare Other

## 2021-02-14 ENCOUNTER — Telehealth: Payer: Self-pay

## 2021-02-14 VITALS — Ht 63.0 in | Wt 157.0 lb

## 2021-02-14 DIAGNOSIS — Z0000 Encounter for general adult medical examination without abnormal findings: Secondary | ICD-10-CM | POA: Diagnosis not present

## 2021-02-14 DIAGNOSIS — R151 Fecal smearing: Secondary | ICD-10-CM | POA: Insufficient documentation

## 2021-02-14 NOTE — Telephone Encounter (Signed)
Pt. Needs to be seen for this. Thanks, WS 

## 2021-02-14 NOTE — Telephone Encounter (Signed)
During AWV, discussing pt's amount of exercise, pt asks if she could have an order for at home PT due to her dx of transverse myelitis, to help strengthen her leg? Pt also asks if she can have an Rx for a shower chair sent to [facility]?

## 2021-02-14 NOTE — Telephone Encounter (Signed)
PATIENT AWARE AND APPOINTMENT SCHEDULED

## 2021-02-14 NOTE — Patient Instructions (Signed)
Amy Black , Thank you for taking time to come for your Medicare Wellness Visit. I appreciate your ongoing commitment to your health goals. Please review the following plan we discussed and let me know if I can assist you in the future.   Screening recommendations/referrals: Colonoscopy: Done 11/10/2020. Repeat in 5 years  Mammogram: Done 01/05/2021. Repeat annually  Bone Density: Done 12/07/2015. Repeat every 2 years  Recommended yearly ophthalmology/optometry visit for glaucoma screening and checkup Recommended yearly dental visit for hygiene and checkup  Vaccinations: Influenza vaccine: Due Repeat annually  Pneumococcal vaccine: Done 02/07/2000. Second dose due. Tdap vaccine: Done 02/06/2002 Repeat in 10 years  Shingles vaccine: Done, 02/06/2010, 11/23/2016 and 01/08/2017   Covid-19:Done 05/08/2019 and 05/29/2019.  Advanced directives: Advance directive discussed with you today. Even though you declined this today, please call our office should you change your mind, and we can give you the proper paperwork for you to fill out.   Conditions/risks identified: Aim for 30 minutes of exercise or brisk walking each day, drink 6-8 glasses of water and eat lots of fruits and vegetables. KEEP UP THE GOOD WORK!!  Next appointment: Follow up in one year for your annual wellness visit 2024.   Preventive Care 79 Years and Older, Female Preventive care refers to lifestyle choices and visits with your health care provider that can promote health and wellness. What does preventive care include? A yearly physical exam. This is also called an annual well check. Dental exams once or twice a year. Routine eye exams. Ask your health care provider how often you should have your eyes checked. Personal lifestyle choices, including: Daily care of your teeth and gums. Regular physical activity. Eating a healthy diet. Avoiding tobacco and drug use. Limiting alcohol use. Practicing safe sex. Taking low-dose  aspirin every day. Taking vitamin and mineral supplements as recommended by your health care provider. What happens during an annual well check? The services and screenings done by your health care provider during your annual well check will depend on your age, overall health, lifestyle risk factors, and family history of disease. Counseling  Your health care provider may ask you questions about your: Alcohol use. Tobacco use. Drug use. Emotional well-being. Home and relationship well-being. Sexual activity. Eating habits. History of falls. Memory and ability to understand (cognition). Work and work Statistician. Reproductive health. Screening  You may have the following tests or measurements: Height, weight, and BMI. Blood pressure. Lipid and cholesterol levels. These may be checked every 5 years, or more frequently if you are over 26 years old. Skin check. Lung cancer screening. You may have this screening every year starting at age 79 if you have a 30-pack-year history of smoking and currently smoke or have quit within the past 15 years. Fecal occult blood test (FOBT) of the stool. You may have this test every year starting at age 79. Flexible sigmoidoscopy or colonoscopy. You may have a sigmoidoscopy every 5 years or a colonoscopy every 10 years starting at age 79. Hepatitis C blood test. Hepatitis B blood test. Sexually transmitted disease (STD) testing. Diabetes screening. This is done by checking your blood sugar (glucose) after you have not eaten for a while (fasting). You may have this done every 1-3 years. Bone density scan. This is done to screen for osteoporosis. You may have this done starting at age 79. Mammogram. This may be done every 1-2 years. Talk to your health care provider about how often you should have regular mammograms. Talk with your  health care provider about your test results, treatment options, and if necessary, the need for more tests. Vaccines  Your  health care provider may recommend certain vaccines, such as: Influenza vaccine. This is recommended every year. Tetanus, diphtheria, and acellular pertussis (Tdap, Td) vaccine. You may need a Td booster every 10 years. Zoster vaccine. You may need this after age 79. Pneumococcal 13-valent conjugate (PCV13) vaccine. One dose is recommended after age 79. Pneumococcal polysaccharide (PPSV23) vaccine. One dose is recommended after age 79. Talk to your health care provider about which screenings and vaccines you need and how often you need them. This information is not intended to replace advice given to you by your health care provider. Make sure you discuss any questions you have with your health care provider. Document Released: 02/19/2015 Document Revised: 10/13/2015 Document Reviewed: 11/24/2014 Elsevier Interactive Patient Education  2017 [place] Prevention in the Home Falls can cause injuries. They can happen to people of all ages. There are many things you can do to make your home safe and to help prevent falls. What can I do on the outside of my home? Regularly fix the edges of walkways and driveways and fix any cracks. Remove anything that might make you trip as you walk through a door, such as a raised step or threshold. Trim any bushes or trees on the path to your home. Use bright outdoor lighting. Clear any walking paths of anything that might make someone trip, such as rocks or tools. Regularly check to see if handrails are loose or broken. Make sure that both sides of any steps have handrails. Any raised decks and porches should have guardrails on the edges. Have any leaves, snow, or ice cleared regularly. Use sand or salt on walking paths during winter. Clean up any spills in your garage right away. This includes oil or grease spills. What can I do in the bathroom? Use night lights. Install grab bars by the toilet and in the tub and shower. Do not use towel bars as  grab bars. Use non-skid mats or decals in the tub or shower. If you need to sit down in the shower, use a plastic, non-slip stool. Keep the floor dry. Clean up any water that spills on the floor as soon as it happens. Remove soap buildup in the tub or shower regularly. Attach bath mats securely with double-sided non-slip rug tape. Do not have throw rugs and other things on the floor that can make you trip. What can I do in the bedroom? Use night lights. Make sure that you have a light by your bed that is easy to reach. Do not use any sheets or blankets that are too big for your bed. They should not hang down onto the floor. Have a firm chair that has side arms. You can use this for support while you get dressed. Do not have throw rugs and other things on the floor that can make you trip. What can I do in the kitchen? Clean up any spills right away. Avoid walking on wet floors. Keep items that you use a lot in easy-to-reach places. If you need to reach something above you, use a strong step stool that has a grab bar. Keep electrical cords out of the way. Do not use floor polish or wax that makes floors slippery. If you must use wax, use non-skid floor wax. Do not have throw rugs and other things on the floor that can make you trip. What  can I do with my stairs? Do not leave any items on the stairs. Make sure that there are handrails on both sides of the stairs and use them. Fix handrails that are broken or loose. Make sure that handrails are as long as the stairways. Check any carpeting to make sure that it is firmly attached to the stairs. Fix any carpet that is loose or worn. Avoid having throw rugs at the top or bottom of the stairs. If you do have throw rugs, attach them to the floor with carpet tape. Make sure that you have a light switch at the top of the stairs and the bottom of the stairs. If you do not have them, ask someone to add them for you. What else can I do to help prevent  falls? Wear shoes that: Do not have high heels. Have rubber bottoms. Are comfortable and fit you well. Are closed at the toe. Do not wear sandals. If you use a stepladder: Make sure that it is fully opened. Do not climb a closed stepladder. Make sure that both sides of the stepladder are locked into place. Ask someone to hold it for you, if possible. Clearly mark and make sure that you can see: Any grab bars or handrails. First and last steps. Where the edge of each step is. Use tools that help you move around (mobility aids) if they are needed. These include: Canes. Walkers. Scooters. Crutches. Turn on the lights when you go into a dark area. Replace any light bulbs as soon as they burn out. Set up your furniture so you have a clear path. Avoid moving your furniture around. If any of your floors are uneven, fix them. If there are any pets around you, be aware of where they are. Review your medicines with your doctor. Some medicines can make you feel dizzy. This can increase your chance of falling. Ask your doctor what other things that you can do to help prevent falls. This information is not intended to replace advice given to you by your health care provider. Make sure you discuss any questions you have with your health care provider. Document Released: 11/19/2008 Document Revised: 07/01/2015 Document Reviewed: 02/27/2014 Elsevier Interactive Patient Education  2017 [employer].

## 2021-02-14 NOTE — Progress Notes (Signed)
Subjective:   Amy Black is a 81 y.o. female who presents for Medicare Annual (Subsequent) preventive examination.   Amy Driver, LPN Virtual Visit via Telephone Note  I connected with  Amy Black on 02/14/21 at  2:45 PM EST by telephone and verified that I am speaking with the correct person using two identifiers.  Location: Patient: HOME Provider: [facility] Persons participating in the virtual visit: patient/Nurse Health Advisor   I discussed the limitations, risks, security and privacy concerns of performing an evaluation and management service by telephone and the availability of in person appointments. The patient expressed understanding and agreed to proceed.  Interactive audio and video telecommunications were attempted between this nurse and patient, however failed, due to patient having technical difficulties OR patient did not have access to video capability.  We continued and completed visit with audio only.  Some vital signs may be absent or patient reported.   Amy Driver, LPN  Review of Systems     Cardiac Risk Factors include: advanced age (>7men, >76 women);hypertension;sedentary lifestyle;Other (see comment), Risk factor comments: Transverse myelitis  PHONE VISIT. PT AT HOME. NURSE AT [facility].    Objective:    Today's Vitals   02/14/21 1449  Weight: 157 lb (71.2 kg)  Height: 5\' 3"  (1.6 m)   Body mass index is 27.81 kg/m.  Advanced Directives 02/14/2021 10/18/2020 04/15/2020 12/29/2019 10/29/2019 09/16/2019 04/28/2019  Does Patient Have a Medical Advance Directive? No No No No No No No  Would patient like information on creating a medical advance directive? No - Patient declined No - Patient declined No - Patient declined No - Patient declined No - Patient declined No - Patient declined No - Patient declined  Pre-existing out of facility DNR order (yellow form or pink MOST form) - - - - - - -    Current Medications (verified) Outpatient  Encounter Medications as of 02/14/2021  Medication Sig   ALLERGY RELIEF 10 MG tablet Take 10 mg by mouth daily.   ALPRAZolam (XANAX) 0.5 MG tablet Take 1 tablet (0.5 mg total) by mouth 3 (three) times daily.   Calcium Citrate-Vitamin D (CALCIUM CITRATE + D3) 315-250 MG-UNIT TABS Take 2 tablets by mouth daily.   cyclobenzaprine (FLEXERIL) 5 MG tablet TAKE ONE TABLET BY MOUTH THREE TIMES DAILY AS NEEDED FOR MUSCLE SPASMS.   fluticasone (FLONASE) 50 MCG/ACT nasal spray Place 2 sprays into both nostrils daily.   Hyoscyamine Sulfate SL (LEVSIN/SL) 0.125 MG SUBL Place 0.125 mg under the tongue every 4 (four) hours as needed (abd cramping and diarrhea).   Iron-FA-B Cmp-C-Biot-Probiotic (FUSION PLUS) CAPS Take 1 capsule by mouth daily.   isosorbide mononitrate (IMDUR) 120 MG 24 hr tablet TAKE TWO (2) TABLETS BY MOUTH ONCE DAILY   nitroGLYCERIN (NITROSTAT) 0.4 MG SL tablet DISSOLVE ONE TABLET UNDER TONGUE EVERY 5 MINUTES UP TO 3 DOSES AS NEEDED FOR CHEST PAIN   sulfamethoxazole-trimethoprim (BACTRIM DS) 800-160 MG tablet Take 1 tablet by mouth 2 (two) times daily. Until gone, for infection   traMADol (ULTRAM) 50 MG tablet TAKE ONE TABLET BY MOUTH EVERY TWELVE HOURS AS NEEDED.   triamterene-hydrochlorothiazide (DYAZIDE) 37.5-25 MG capsule TAKE ONE CAPSULE BY MOUTH 3 TIMES PER WEEK   verapamil (CALAN-SR) 120 MG CR tablet Take 1 tablet (120 mg total) by mouth at bedtime.   No facility-administered encounter medications on file as of 02/14/2021.    Allergies (verified) Iodine, Amlodipine, Lexapro [escitalopram oxalate], Cymbalta [duloxetine hcl], Gabapentin, Ivp dye [  iodinated contrast media], and Zanaflex [tizanidine hcl]   History: Past Medical History:  Diagnosis Date   Adenocarcinoma ([place])    LEFT LEG   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 minimal CAD by cath in 2016 with presumed spasm (20% mLAD, 20% D2).    Cataract    bilateral cataracts removed   Coronary artery spasm ([facility])    Diverticulosis of colon (without mention of hemorrhage)    Femoral neck fracture, right, closed, initial encounter 10/04/2015   GERD (gastroesophageal reflux disease)    hepatic cyst    History of nuclear stress test 04/2011   lexiscan; normal study, no significant ischemia, low risk; 2015 - normal   HTN (hypertension)    PT. DENIES   Hyperlipidemia    Insomnia    Irritable bowel syndrome    Transverse myelitis ([place])    Vitamin D deficiency    Past Surgical History:  Procedure Laterality Date   ABDOMINAL HYSTERECTOMY Bilateral    CARDIAC CATHETERIZATION     coronary spasm   CARDIAC CATHETERIZATION N/A 08/28/2014   Procedure: Left Heart Cath and Coronary Angiography;  Surgeon: Jettie Booze, MD;  Location: [facility];  Service: Cardiovascular;  Laterality: N/A;   COLONOSCOPY     DIAGNOSTIC LAPAROSCOPY     LYSIS OF ADHESION     PELVIC LAPAROSCOPY  1992   RSO, AND LSO ON 2007   RESECTION SOFT TISSUE TUMOR LEG / ANKLE RADICAL  2004   leg lesion resection & radiation   SALPINGOOPHORECTOMY Left    TOTAL HIP ARTHROPLASTY Right 10/04/2015   Procedure: TOTAL HIP ARTHROPLASTY ANTERIOR APPROACH;  Surgeon: Rod Can, MD;  Location: [facility];  Service: Orthopedics;  Laterality: Right;   TRANSTHORACIC ECHOCARDIOGRAM  07/2012   LV cavity size mildly reduced, normal wall motion; MV with calcified annulus and mild MR; Amy mildly dilated; atrial septum with increased thickness - lipomatous hypertrophy; RV systolic pressure increased (borderline pulm HTN)   UPPER GASTROINTESTINAL ENDOSCOPY     Family History  Problem Relation Age of Onset   Bladder Cancer Mother    Heart disease Mother    Hypertension Mother    Heart disease Father    Stroke Father    Hyperlipidemia Sister    Depression Sister    Diabetes Brother    Seizures Brother    Hypertension Brother    Diabetes Other        Multiple family  members on both sides    Coronary artery disease Other        Multiple family members on both sides    Colon cancer Neg Hx    Esophageal cancer Neg Hx    Pancreatic cancer Neg Hx    Rectal cancer Neg Hx    Stomach cancer Neg Hx    Breast cancer Neg Hx    Social History   Socioeconomic History   Marital status: Married    Spouse name: Augustin Coupe   Number of children: 2   Years of education: 12   Highest education level: High school graduate  Occupational History   Occupation: [employer]   Occupation: DISABILITY    Employer: UNEMPLOYED  Tobacco Use   Smoking status: Former    Packs/day: 0.50    Years: 4.00    Pack years: 2.00    Types: Cigarettes    Start date: 12/07/1968  Quit date: 11/20/1972    Years since quitting: 48.2   Smokeless tobacco: Never   Tobacco comments:    quit 40 years ago  Vaping Use   Vaping Use: Never used  Substance and Sexual Activity   Alcohol use: No    Alcohol/week: 0.0 standard drinks   Drug use: No   Sexual activity: Not Currently    Birth control/protection: Surgical  Other Topics Concern   Not on file  Social History Narrative   Disabled, lives with husband and daughter.  Enjoys shopping.    1 son and 1 daughter.   1 grandson.   Social Determinants of Health   Financial Resource Strain: Low Risk    Difficulty of Paying Living Expenses: Not hard at all  Food Insecurity: No Food Insecurity   Worried About Charity fundraiser in the Last Year: Never true   [place] in the Last Year: Never true  Transportation Needs: No Transportation Needs   Lack of Transportation (Medical): No   Lack of Transportation (Non-Medical): No  Physical Activity: Insufficiently Active   Days of Exercise per Week: 5 days   Minutes of Exercise per Session: 20 min  Stress: No Stress Concern Present   Feeling of Stress : Only a little  Social Connections: Engineer, building services of Communication with Friends and Family: More than three times a  week   Frequency of Social Gatherings with Friends and Family: More than three times a week   Attends Religious Services: More than 4 times per year   Active Member of [employer] or Organizations: Yes   Attends Music therapist: More than 4 times per year   Marital Status: Married    Tobacco Counseling Counseling given: Not Answered Tobacco comments: quit 40 years ago   Clinical Intake:  Pre-visit preparation completed: Yes  Pain : No/denies pain     BMI - recorded: 27.81 Nutritional Status: BMI 25 -29 Overweight Nutritional Risks: None Diabetes: No  How often do you need to have someone help you when you read instructions, pamphlets, or other written materials from your doctor or pharmacy?: 1 - Never  Diabetic?NO  Interpreter Needed?: No  Information entered by :: MJ Bianca Raneri, LPN   Activities of Daily Living In your present state of health, do you have any difficulty performing the following activities: 02/14/2021  Hearing? Y  Vision? N  Difficulty concentrating or making decisions? N  Walking or climbing stairs? N  Dressing or bathing? N  Doing errands, shopping? N  Preparing Food and eating ? N  Using the Toilet? N  In the past six months, have you accidently leaked urine? N  Do you have problems with loss of bowel control? N  Managing your Medications? N  Managing your Finances? N  Housekeeping or managing your Housekeeping? N  Some recent data might be hidden    Patient Care Team: [name], MD as PCP - General (Family Medicine) Minus Breeding, MD as PCP - Cardiology (Cardiology) Lonna Cobb, PT (Inactive) as Physical Therapist (Physical Therapy) Celso Amy, NP as Nurse Practitioner (Nurse Practitioner) Doran Stabler, MD as Consulting Physician (Gastroenterology) Rod Can, MD as Consulting Physician (Orthopedic Surgery) Volanda Napoleon, MD as Consulting Physician (Oncology)  Indicate any recent Medical Services  you may have received from other than [facility] providers in the past year (date may be approximate).     Assessment:   This is a routine wellness examination for [place].  Hearing/Vision screen Hearing Screening - Comments:: HOH. Wears hearing aids.  Vision Screening - Comments:: Glasses. Dr. Noralee Space in [place]. 2022.  Dietary issues and exercise activities discussed: Current Exercise Habits: Home exercise routine, Type of exercise: walking, Time (Minutes): 20, Frequency (Times/Week): 5, Weekly Exercise (Minutes/Week): 100, Exercise limited by: cardiac condition(s);orthopedic condition(s)   Goals Addressed             This Visit's Progress    Cut out extra servings   On track    Reduce snack intake.      DIET - INCREASE WATER INTAKE   On track    Try to drink 6-8 glasses of water daily.       Depression Screen PHQ 2/9 Scores 02/14/2021 01/10/2021 11/23/2020 08/19/2020 06/30/2020 05/05/2020 05/05/2020  PHQ - 2 Score 0 0 0 0 0 0 0  PHQ- 9 Score - - - - - 2 -    Fall Risk Fall Risk  02/14/2021 01/10/2021 11/23/2020 06/30/2020 05/05/2020  Falls in the past year? 0 0 0 0 0  Number falls in past yr: 0 - - - -  Injury with Fall? 0 - - - -  Risk Factor Category  - - - - -  Risk for fall due to : Impaired balance/gait;Impaired mobility - - - -  Follow up Falls prevention discussed - - - -    FALL RISK PREVENTION PERTAINING TO THE HOME:  Any stairs in or around the home? Yes  If so, are there any without handrails? No  Home free of loose throw rugs in walkways, pet beds, electrical cords, etc? Yes  Adequate lighting in your home to reduce risk of falls? Yes   ASSISTIVE DEVICES UTILIZED TO PREVENT FALLS:  Life alert? No  Use of a cane, walker or w/c? Yes  Grab bars in the bathroom? No  Shower chair or bench in shower? No  Elevated toilet seat or a handicapped toilet? Yes   TIMED UP AND GO:  Was the test performed? No .  Phone visit.  Cognitive Function: MMSE - Mini Mental State  Exam 06/14/2017 12/07/2015  Orientation to time 5 5  Orientation to Place 5 5  Registration 3 3  Attention/ Calculation 3 3  Recall 3 3  Language- name 2 objects 2 2  Language- repeat 1 1  Language- follow 3 step command 3 3  Language- read & follow direction 1 1  Write a sentence 1 1  Copy design 1 1  Total score 28 28     6CIT Screen 09/16/2019 09/10/2018  What Year? 0 points 0 points  What month? 0 points 0 points  What time? 0 points 0 points  Count back from 20 2 points 0 points  Months in reverse 4 points 2 points  Repeat phrase 0 points 0 points  Total Score 6 2    Immunizations Immunization History  Administered Date(s) Administered   PFIZER(Purple Top)SARS-COV-2 Vaccination 05/08/2019, 05/29/2019   Pneumococcal Polysaccharide-23 02/07/2000   Tdap 02/06/2002   Zoster Recombinat (Shingrix) 11/06/2016, 01/08/2017   Zoster, Live 02/06/2010    TDAP status: Due, Education has been provided regarding the importance of this vaccine. Advised may receive this vaccine at local pharmacy or Health Dept. Aware to provide a copy of the vaccination record if obtained from local pharmacy or Health Dept. Verbalized acceptance and understanding.  Flu Vaccine status: Due, Education has been provided regarding the importance of this vaccine. Advised may receive this vaccine at local pharmacy or Health  Dept. Aware to provide a copy of the vaccination record if obtained from local pharmacy or Health Dept. Verbalized acceptance and understanding.  Pneumococcal vaccine status: Due, Education has been provided regarding the importance of this vaccine. Advised may receive this vaccine at local pharmacy or Health Dept. Aware to provide a copy of the vaccination record if obtained from local pharmacy or Health Dept. Verbalized acceptance and understanding. 2nd dose due. Covid-19 vaccine status: Completed vaccines  Qualifies for Shingles Vaccine? Yes   Zostavax completed Yes   Shingrix Completed?:  Yes  Screening Tests Health Maintenance  Topic Date Due   URINE MICROALBUMIN  Never done   Pneumonia Vaccine 29+ Years old (2 - PCV) 02/06/2001   COVID-19 Vaccine (3 - Pfizer risk series) 06/26/2019   INFLUENZA VACCINE  05/06/2021 (Originally 09/06/2020)   DEXA SCAN  11/23/2021 (Originally 12/06/2017)   TETANUS/TDAP  11/23/2021 (Originally 02/07/2012)   MAMMOGRAM  01/05/2022   COLONOSCOPY (Pts 45-89yrs Insurance coverage will need to be confirmed)  11/10/2025   Zoster Vaccines- Shingrix  Completed   HPV VACCINES  Aged Out   PAP SMEAR-Modifier  Discontinued   Hepatitis C Screening  Discontinued    Health Maintenance  Health Maintenance Due  Topic Date Due   URINE MICROALBUMIN  Never done   Pneumonia Vaccine 70+ Years old (2 - PCV) 02/06/2001   COVID-19 Vaccine (3 - Pfizer risk series) 06/26/2019    Colorectal cancer screening: Type of screening: Colonoscopy. Completed 11/10/2020. Repeat every 5 years  Mammogram status: Completed 01/05/2021. Repeat every year  Bone Density status: Completed 12/07/2015. Results reflect: Bone density results: OSTEOPOROSIS. Repeat every 2 years.  Lung Cancer Screening: (Low Dose CT Chest recommended if Age 17-80 years, 30 pack-year currently smoking OR have quit w/in 15years.) does not qualify.  Additional Screening:  Hepatitis C Screening: does not qualify;   Vision Screening: Recommended annual ophthalmology exams for early detection of glaucoma and other disorders of the eye. Is the patient up to date with their annual eye exam?  Yes  Who is the provider or what is the name of the office in which the patient attends annual eye exams? Dr. Elta Guadeloupe in [place]. If pt is not established with a provider, would they like to be referred to a provider to establish care? No .   Dental Screening: Recommended annual dental exams for proper oral hygiene  Community Resource Referral / Chronic Care Management: CRR required this visit?  No   CCM required  this visit?  No      Plan:     I have personally reviewed and noted the following in the patients chart:   Medical and social history Use of alcohol, tobacco or illicit drugs  Current medications and supplements including opioid prescriptions.  Functional ability and status Nutritional status Physical activity Advanced directives List of other physicians Hospitalizations, surgeries, and ER visits in previous 12 months Vitals Screenings to include cognitive, depression, and falls Referrals and appointments  In addition, I have reviewed and discussed with patient certain preventive protocols, quality metrics, and best practice recommendations. A written personalized care plan for preventive services as well as general preventive health recommendations were provided to patient.     Amy Driver, LPN   07/09/2295   Nurse Notes: Pt is up to date on age appropriate health maintenance except for bone density. Pt declines scheduling at this time. Discussed flu, pneumococcal and Tdap vaccines and how to obtain.

## 2021-02-17 ENCOUNTER — Telehealth: Payer: Self-pay | Admitting: Family Medicine

## 2021-02-17 NOTE — Telephone Encounter (Signed)
Pt has appt scheduled with Dr Livia Snellen to get order for shower chair 03/01/21, will close encounter.

## 2021-03-01 ENCOUNTER — Ambulatory Visit: Payer: Medicare Other | Admitting: Family Medicine

## 2021-03-01 DIAGNOSIS — H43811 Vitreous degeneration, right eye: Secondary | ICD-10-CM | POA: Diagnosis not present

## 2021-03-09 ENCOUNTER — Other Ambulatory Visit: Payer: Self-pay | Admitting: Family Medicine

## 2021-03-21 ENCOUNTER — Ambulatory Visit: Payer: Medicare Other | Admitting: Family Medicine

## 2021-03-31 DIAGNOSIS — M7741 Metatarsalgia, right foot: Secondary | ICD-10-CM | POA: Diagnosis not present

## 2021-03-31 DIAGNOSIS — M2141 Flat foot [pes planus] (acquired), right foot: Secondary | ICD-10-CM | POA: Diagnosis not present

## 2021-03-31 DIAGNOSIS — M2142 Flat foot [pes planus] (acquired), left foot: Secondary | ICD-10-CM | POA: Diagnosis not present

## 2021-04-04 ENCOUNTER — Ambulatory Visit (INDEPENDENT_AMBULATORY_CARE_PROVIDER_SITE_OTHER): Payer: Medicare Other | Admitting: Family Medicine

## 2021-04-04 VITALS — BP 135/82 | HR 95 | Temp 99.9°F | Ht 63.0 in | Wt 158.4 lb

## 2021-04-04 DIAGNOSIS — R29898 Other symptoms and signs involving the musculoskeletal system: Secondary | ICD-10-CM

## 2021-04-04 DIAGNOSIS — I10 Essential (primary) hypertension: Secondary | ICD-10-CM

## 2021-04-04 DIAGNOSIS — F419 Anxiety disorder, unspecified: Secondary | ICD-10-CM | POA: Diagnosis not present

## 2021-04-04 DIAGNOSIS — G373 Acute transverse myelitis in demyelinating disease of central nervous system: Secondary | ICD-10-CM

## 2021-04-04 MED ORDER — TRAMADOL HCL 50 MG PO TABS: ORAL_TABLET | ORAL | 1 refills | Status: DC

## 2021-04-04 MED ORDER — ALPRAZOLAM 0.5 MG PO TABS: 0.5000 mg | ORAL_TABLET | Freq: Three times a day (TID) | ORAL | 4 refills | Status: DC

## 2021-04-04 MED ORDER — VERAPAMIL HCL ER 120 MG PO TBCR: 120.0000 mg | EXTENDED_RELEASE_TABLET | Freq: Every day | ORAL | 3 refills | Status: DC

## 2021-04-04 NOTE — Progress Notes (Signed)
Subjective:  Patient ID: Amy Black, female    DOB: December 05, 1940  Age: 81 y.o. MRN: [id number]  CC: office visit (Office visit for home heath service for a chair for the tub)   HPI Amy Black presents for problem sitting down and getting up from the bath tub. Right foot and leg are weak and painful. This is from her diagnosis of arthritis. The leg also has nerve pain.   Also taking tramadol intermittently for the RLE and Transverse myelitis .  Her anxiety flares as a result of her heealth and her Husband's dementia. That is controlled with her xanax.  Depression screen [facility] 2/9 04/04/2021 02/14/2021 01/10/2021  Decreased Interest 0 0 0  Down, Depressed, Hopeless 0 0 0  PHQ - 2 Score 0 0 0  Altered sleeping 0 - -  Tired, decreased energy 0 - -  Change in appetite 0 - -  Feeling bad or failure about yourself  0 - -  Trouble concentrating 0 - -  Moving slowly or fidgety/restless 0 - -  Suicidal thoughts 0 - -  PHQ-9 Score 0 - -  Difficult doing work/chores - - -  Some recent data might be hidden   GAD 7 : Generalized Anxiety Score 04/04/2021 11/23/2020 05/05/2020 07/03/2019  Nervous, Anxious, on Edge 0 0 0 0  Control/stop worrying 0 0 0 0  Worry too much - different things 0 0 0 0  Trouble relaxing 0 0 0 0  Restless 0 0 0 0  Easily annoyed or irritable 0 0 0 0  Afraid - awful might happen 0 0 0 0  Total GAD 7 Score 0 0 0 0  Anxiety Difficulty - Not difficult at all Not difficult at all -      History Amy Black has a past medical history of Adenocarcinoma ([place]), Adenosquamous carcinoma, Allergy, Anxiety, CAD (coronary artery disease), Cataract, Coronary artery spasm ([place]), Diverticulosis of colon (without mention of hemorrhage), Femoral neck fracture, right, closed, initial encounter (10/04/2015), GERD (gastroesophageal reflux disease), hepatic cyst, History of nuclear stress test (04/2011), HTN (hypertension), Hyperlipidemia, Insomnia, Irritable bowel syndrome, Transverse  myelitis ([place]), and Vitamin D deficiency.   She has a past surgical history that includes Resection soft tissue tumor leg / ankle radical (2004); Diagnostic laparoscopy; Lysis of adhesion; Salpingoophorectomy (Left); Pelvic laparoscopy (1992); Cardiac catheterization; transthoracic echocardiogram (07/2012); Cardiac catheterization (N/A, 08/28/2014); Colonoscopy; Upper gastrointestinal endoscopy; Total hip arthroplasty (Right, 10/04/2015); and Abdominal hysterectomy (Bilateral).   Her family history includes Bladder Cancer in her mother; Coronary artery disease in an other family member; Depression in her sister; Diabetes in her brother and another family member; Heart disease in her father and mother; Hyperlipidemia in her sister; Hypertension in her brother and mother; Seizures in her brother; Stroke in her father.She reports that she quit smoking about 48 years ago. Her smoking use included cigarettes. She started smoking about 52 years ago. She has a 2.00 pack-year smoking history. She has never used smokeless tobacco. She reports that she does not drink alcohol and does not use drugs.    ROS Review of Systems  Constitutional: Negative.   HENT: Negative.    Eyes:  Negative for visual disturbance.  Respiratory:  Negative for shortness of breath.   Cardiovascular:  Negative for chest pain.  Gastrointestinal:  Negative for abdominal pain.  Musculoskeletal:  Positive for arthralgias and myalgias.  Psychiatric/Behavioral:  The patient is nervous/anxious.    Objective:  BP 135/82    Pulse 95  Temp 99.9 F (37.7 C) (Temporal)    Ht 5\' 3"  (1.6 m)    Wt 158 lb 6.4 oz (71.8 kg)    SpO2 97%    BMI 28.06 kg/m   BP Readings from Last 3 Encounters:  04/04/21 135/82  01/10/21 119/70  01/05/21 132/74    Wt Readings from Last 3 Encounters:  04/04/21 158 lb 6.4 oz (71.8 kg)  02/14/21 157 lb (71.2 kg)  01/10/21 157 lb (71.2 kg)     Physical Exam Constitutional:      General: She is not in acute  distress.    Appearance: She is well-developed.  Cardiovascular:     Rate and Rhythm: Normal rate and regular rhythm.  Pulmonary:     Breath sounds: Normal breath sounds.  Musculoskeletal:        General: Normal range of motion.  Skin:    General: Skin is warm and dry.  Neurological:     Mental Status: She is alert and oriented to person, place, and time.      Assessment & Plan:   Amy Black was seen today for office visit.  Diagnoses and all orders for this visit:  Weakness of right lower extremity -     Cancel: For home use only DME Other see comment -     For home use only DME Other see comment  Essential hypertension -     verapamil (CALAN-SR) 120 MG CR tablet; Take 1 tablet (120 mg total) by mouth at bedtime.  Transverse myelitis (HCC) -     traMADol (ULTRAM) 50 MG tablet; TAKE ONE TABLET BY MOUTH EVERY TWELVE HOURS AS NEEDED. -     Cancel: For home use only DME Other see comment -     For home use only DME Other see comment  Anxiety -     ALPRAZolam (XANAX) 0.5 MG tablet; Take 1 tablet (0.5 mg total) by mouth 3 (three) times daily.       I have discontinued Lorenz Coaster. Melnyk's sulfamethoxazole-trimethoprim. I am also having her maintain her Calcium Citrate-Vitamin D, fluticasone, Fusion Plus, Hyoscyamine Sulfate SL, isosorbide mononitrate, Allergy Relief, cyclobenzaprine, triamterene-hydrochlorothiazide, nitroGLYCERIN, hydrOXYzine, verapamil, traMADol, and ALPRAZolam.  Allergies as of 04/04/2021       Reactions   Iodine Itching   Amlodipine Other (See Comments)   headaches   Lexapro [escitalopram Oxalate] Other (See Comments)   Drunk, High feeling   Cymbalta [duloxetine Hcl] Other (See Comments)   sleepiness   Gabapentin Other (See Comments)   sleepiness   Ivp Dye [iodinated Contrast Media] Itching   Zanaflex [tizanidine Hcl] Other (See Comments)   Very drunk feeling next day        Medication List        Accurate as of April 04, 2021  1:49 PM.  If you have any questions, ask your nurse or doctor.          STOP taking these medications    sulfamethoxazole-trimethoprim 800-160 MG tablet Commonly known as: BACTRIM DS       TAKE these medications    Allergy Relief 10 MG tablet Generic drug: loratadine Take 10 mg by mouth daily.   ALPRAZolam 0.5 MG tablet Commonly known as: XANAX Take 1 tablet (0.5 mg total) by mouth 3 (three) times daily.   Calcium Citrate-Vitamin D 315-250 MG-UNIT Tabs Commonly known as: Calcium Citrate + D3 Take 2 tablets by mouth daily.   cyclobenzaprine 5 MG tablet Commonly known as: FLEXERIL TAKE ONE TABLET BY MOUTH  THREE TIMES DAILY AS NEEDED FOR MUSCLE SPASMS.   fluticasone 50 MCG/ACT nasal spray Commonly known as: FLONASE Place 2 sprays into both nostrils daily.   Fusion Plus Caps Take 1 capsule by mouth daily.   hydrOXYzine 10 MG tablet Commonly known as: ATARAX TAKE 1 TABLET TWICE DAILY.   Hyoscyamine Sulfate SL 0.125 MG Subl Commonly known as: Levsin/SL Place 0.125 mg under the tongue every 4 (four) hours as needed (abd cramping and diarrhea).   isosorbide mononitrate 120 MG 24 hr tablet Commonly known as: IMDUR TAKE TWO (2) TABLETS BY MOUTH ONCE DAILY   nitroGLYCERIN 0.4 MG SL tablet Commonly known as: NITROSTAT DISSOLVE ONE TABLET UNDER TONGUE EVERY 5 MINUTES UP TO 3 DOSES AS NEEDED FOR CHEST PAIN   traMADol 50 MG tablet Commonly known as: ULTRAM TAKE ONE TABLET BY MOUTH EVERY TWELVE HOURS AS NEEDED.   triamterene-hydrochlorothiazide 37.5-25 MG capsule Commonly known as: DYAZIDE TAKE ONE CAPSULE BY MOUTH 3 TIMES PER WEEK   verapamil 120 MG CR tablet Commonly known as: CALAN-SR Take 1 tablet (120 mg total) by mouth at bedtime.               Durable Medical Equipment  (From admission, onward)           Start     Ordered   04/04/21 0000  For home use only DME Other see comment       Comments: Bath tub chair. Dx: [id number]:491  Question:  Length of Need   Answer:  Lifetime   04/04/21 1347             Follow-up: Return in about 5 months (around 09/01/2021).  [name], M.D.

## 2021-04-05 ENCOUNTER — Other Ambulatory Visit: Payer: Self-pay | Admitting: Family Medicine

## 2021-04-05 DIAGNOSIS — J301 Allergic rhinitis due to pollen: Secondary | ICD-10-CM

## 2021-04-06 ENCOUNTER — Other Ambulatory Visit: Payer: Self-pay

## 2021-04-06 ENCOUNTER — Ambulatory Visit: Payer: Medicare Other | Admitting: Podiatry

## 2021-04-06 DIAGNOSIS — R252 Cramp and spasm: Secondary | ICD-10-CM

## 2021-04-06 MED ORDER — CYCLOBENZAPRINE HCL 10 MG PO TABS: 10.0000 mg | ORAL_TABLET | Freq: Three times a day (TID) | ORAL | 0 refills | Status: DC | PRN

## 2021-04-08 DIAGNOSIS — H10411 Chronic giant papillary conjunctivitis, right eye: Secondary | ICD-10-CM | POA: Diagnosis not present

## 2021-04-08 DIAGNOSIS — H04123 Dry eye syndrome of bilateral lacrimal glands: Secondary | ICD-10-CM | POA: Diagnosis not present

## 2021-04-08 DIAGNOSIS — H02831 Dermatochalasis of right upper eyelid: Secondary | ICD-10-CM | POA: Diagnosis not present

## 2021-04-08 DIAGNOSIS — H02834 Dermatochalasis of left upper eyelid: Secondary | ICD-10-CM | POA: Diagnosis not present

## 2021-04-12 ENCOUNTER — Telehealth: Payer: Self-pay | Admitting: Family Medicine

## 2021-04-12 NOTE — Progress Notes (Signed)
?Subjective:  ?Patient ID: Amy Black, female    DOB: [birth year],  MRN: [id number] ? ?Chief Complaint  ?Patient presents with  ? Toe Pain  ?  Right foot   ? ? ?81 y.o. female presents with the above complaint.  Patient presents with complaint of right second third and fourth digit foot cramps.  She states that this has been going for quite some time.  It is painful to touch.  She states she started Friday.  She does not experience any charley horse.  This does not appear to be nocturnally correlated.  She has not seen anyone else prior to seeing me.  She denies any other acute complaints pain scale 7 out of 10 hurts with ambulation. ? ? ?Review of Systems: Negative except as noted in the HPI. Denies N/V/F/Ch. ? ?Past Medical History:  ?Diagnosis Date  ? Adenocarcinoma ([place])   ? LEFT LEG  ? Adenosquamous carcinoma   ? left leg 2004 - radiation & resection  ? Allergy   ? Anxiety   ? CAD (coronary artery disease)   ? a. minimal CAD by cath in 2016 with presumed spasm (20% mLAD, 20% D2).  ? Cataract   ? bilateral cataracts removed  ? Coronary artery spasm ([place])   ? Diverticulosis of colon (without mention of hemorrhage)   ? Femoral neck fracture, right, closed, initial encounter 10/04/2015  ? GERD (gastroesophageal reflux disease)   ? hepatic cyst   ? History of nuclear stress test 04/2011  ? lexiscan; normal study, no significant ischemia, low risk; 2015 - normal  ? HTN (hypertension)   ? PT. DENIES  ? Hyperlipidemia   ? Insomnia   ? Irritable bowel syndrome   ? Transverse myelitis ([place])   ? Vitamin D deficiency   ? ? ?Current Outpatient Medications:  ?  cyclobenzaprine (FLEXERIL) 10 MG tablet, Take 1 tablet (10 mg total) by mouth 3 (three) times daily as needed for muscle spasms., Disp: 30 tablet, Rfl: 0 ?  ALLERGY RELIEF 10 MG tablet, TAKE ONE TABLET BY MOUTH DAILY, Disp: 30 tablet, Rfl: 11 ?  ALPRAZolam (XANAX) 0.5 MG tablet, Take 1 tablet (0.5 mg total) by mouth 3 (three) times daily., Disp: 90 tablet, Rfl: 4 ?   Calcium Citrate-Vitamin D (CALCIUM CITRATE + D3) 315-250 MG-UNIT TABS, Take 2 tablets by mouth daily., Disp: 120 tablet, Rfl:  ?  cyclobenzaprine (FLEXERIL) 5 MG tablet, TAKE ONE TABLET BY MOUTH THREE TIMES DAILY AS NEEDED FOR MUSCLE SPASMS., Disp: 90 tablet, Rfl: 5 ?  fluticasone (FLONASE) 50 MCG/ACT nasal spray, Place 2 sprays into both nostrils daily. (Patient not taking: Reported on 04/04/2021), Disp: 16 g, Rfl: 9 ?  hydrOXYzine (ATARAX) 10 MG tablet, TAKE 1 TABLET TWICE DAILY., Disp: 60 tablet, Rfl: 3 ?  Hyoscyamine Sulfate SL (LEVSIN/SL) 0.125 MG SUBL, Place 0.125 mg under the tongue every 4 (four) hours as needed (abd cramping and diarrhea)., Disp: 120 tablet, Rfl: 5 ?  Iron-FA-B Cmp-C-Biot-Probiotic (FUSION PLUS) CAPS, Take 1 capsule by mouth daily., Disp: 30 capsule, Rfl: 6 ?  isosorbide mononitrate (IMDUR) 120 MG 24 hr tablet, TAKE TWO (2) TABLETS BY MOUTH ONCE DAILY, Disp: 180 tablet, Rfl: 3 ?  nitroGLYCERIN (NITROSTAT) 0.4 MG SL tablet, DISSOLVE ONE TABLET UNDER TONGUE EVERY 5 MINUTES UP TO 3 DOSES AS NEEDED FOR CHEST PAIN, Disp: 25 tablet, Rfl: 11 ?  traMADol (ULTRAM) 50 MG tablet, TAKE ONE TABLET BY MOUTH EVERY TWELVE HOURS AS NEEDED., Disp: 60 tablet, Rfl: 1 ?  triamterene-hydrochlorothiazide (DYAZIDE) 37.5-25 MG capsule, TAKE ONE CAPSULE BY MOUTH 3 TIMES PER WEEK, Disp: 90 capsule, Rfl: 3 ?  verapamil (CALAN-SR) 120 MG CR tablet, Take 1 tablet (120 mg total) by mouth at bedtime., Disp: 90 tablet, Rfl: 3 ? ?Social History  ? ?Tobacco Use  ?Smoking Status Former  ? Packs/day: 0.50  ? Years: 4.00  ? Pack years: 2.00  ? Types: Cigarettes  ? Start date: 12/07/1968  ? Quit date: 11/20/1972  ? Years since quitting: 48.4  ?Smokeless Tobacco Never  ?Tobacco Comments  ? quit 40 years ago  ? ? ?Allergies  ?Allergen Reactions  ? Iodine Itching  ? Amlodipine Other (See Comments)  ?  headaches  ? Lexapro [Escitalopram Oxalate] Other (See Comments)  ?  Drunk, High feeling  ? Cymbalta [Duloxetine Hcl] Other (See  Comments)  ?  sleepiness  ? Gabapentin Other (See Comments)  ?  sleepiness  ? Ivp Dye [Iodinated Contrast Media] Itching  ? Zanaflex [Tizanidine Hcl] Other (See Comments)  ?  Very drunk feeling next day  ? ?Objective:  ?There were no vitals filed for this visit. ?There is no height or weight on file to calculate BMI. ?Constitutional Well developed. ?Well nourished.  ?Vascular Dorsalis pedis pulses palpable bilaterally. ?Posterior tibial pulses palpable bilaterally. ?Capillary refill normal to all digits.  ?No cyanosis or clubbing noted. ?Pedal hair growth normal.  ?Neurologic Normal speech. ?Oriented to person, place, and time. ?Epicritic sensation to light touch grossly present bilaterally.  ?Dermatologic Nails well groomed and normal in appearance. ?No open wounds. ?No skin lesions.  ?Orthopedic: Unable to appreciate cramping right now.  Patient has good strength to all of her toes with good extensor and flexor tendinitis.  Hammertoe contractures noted that could likely be an attributing to cramping due to small muscles firing at the wrong time to cramp of the toes.  Negative charley horses symptoms.  Negative claudication symptoms.  ? ?Radiographs: None ?Assessment:  ? ?1. Foot cramps   ? ?Plan:  ?Patient was evaluated and treated and all questions answered. ? ?Right second third and fourth digit cramping with a history of peripheral vascular disease ?-All questions and concerns were discussed with the patient in extensive detail.  She has palpable pulses to both of her foot ruling out peripheral vascular disease.  I believe this may just be small foot tendinitis versus large leg tendons imbalance.  I discussed this with the patient she states understanding.  At this time we will clinically not able to appreciate cramping. ?-She would benefit from Flexeril to help with muscle cramps.  She states understanding will take that as needed. ? ?No follow-ups on file.  ?

## 2021-04-12 NOTE — Telephone Encounter (Signed)
FAXED TO [facility] PER PATIENT REQUEST ?

## 2021-04-12 NOTE — Telephone Encounter (Signed)
Bottom line. Need to find out where pt. Wants it sent and resend it.  ?

## 2021-04-15 ENCOUNTER — Encounter: Payer: Self-pay | Admitting: Family Medicine

## 2021-04-15 ENCOUNTER — Ambulatory Visit (INDEPENDENT_AMBULATORY_CARE_PROVIDER_SITE_OTHER): Payer: Medicare Other | Admitting: Family Medicine

## 2021-04-15 ENCOUNTER — Telehealth: Payer: Self-pay | Admitting: Podiatry

## 2021-04-15 VITALS — BP 117/66 | HR 87 | Temp 98.0°F | Ht 63.0 in | Wt 159.0 lb

## 2021-04-15 DIAGNOSIS — J019 Acute sinusitis, unspecified: Secondary | ICD-10-CM | POA: Diagnosis not present

## 2021-04-15 DIAGNOSIS — B9689 Other specified bacterial agents as the cause of diseases classified elsewhere: Secondary | ICD-10-CM

## 2021-04-15 MED ORDER — AMOXICILLIN-POT CLAVULANATE 875-125 MG PO TABS: 1.0000 | ORAL_TABLET | Freq: Two times a day (BID) | ORAL | 0 refills | Status: DC

## 2021-04-15 MED ORDER — BENZONATATE 100 MG PO CAPS: 100.0000 mg | ORAL_CAPSULE | Freq: Three times a day (TID) | ORAL | 0 refills | Status: DC | PRN

## 2021-04-15 NOTE — Telephone Encounter (Signed)
Patient stated that she is still having severe pain on the ball of her right foot. She is requesting stronger pain meds and xray ? ?Please advise   ?

## 2021-04-15 NOTE — Progress Notes (Signed)
? ?Subjective: ?CC: Sinusitis ?PCP: [name], MD ?Amy Black is a 81 y.o. female presenting to clinic today for: ? ?1.  Sinusitis ?Patient reports acute onset of sinus pressure, congestion in her chest and dry cough about 2 weeks ago.  She is been taking over-the-counter Mucinex, using her Flonase and oral antihistamine but symptoms are just progressing.  She denies any fevers, hemoptysis, brown sputum, shortness of breath or wheezing.  No known sick contacts.  She is tolerating p.o. intake without difficulty ? ? ?ROS: Per HPI ? ?Allergies  ?Allergen Reactions  ? Iodine Itching  ? Amlodipine Other (See Comments)  ?  headaches  ? Lexapro [Escitalopram Oxalate] Other (See Comments)  ?  Drunk, High feeling  ? Cymbalta [Duloxetine Hcl] Other (See Comments)  ?  sleepiness  ? Gabapentin Other (See Comments)  ?  sleepiness  ? Ivp Dye [Iodinated Contrast Media] Itching  ? Zanaflex [Tizanidine Hcl] Other (See Comments)  ?  Very drunk feeling next day  ? ?Past Medical History:  ?Diagnosis Date  ? Adenocarcinoma ([place])   ? LEFT LEG  ? Adenosquamous carcinoma   ? left leg 2004 - radiation & resection  ? Allergy   ? Anxiety   ? CAD (coronary artery disease)   ? a. minimal CAD by cath in 2016 with presumed spasm (20% mLAD, 20% D2).  ? Cataract   ? bilateral cataracts removed  ? Coronary artery spasm ([place])   ? Diverticulosis of colon (without mention of hemorrhage)   ? Femoral neck fracture, right, closed, initial encounter 10/04/2015  ? GERD (gastroesophageal reflux disease)   ? hepatic cyst   ? History of nuclear stress test 04/2011  ? lexiscan; normal study, no significant ischemia, low risk; 2015 - normal  ? HTN (hypertension)   ? PT. DENIES  ? Hyperlipidemia   ? Insomnia   ? Irritable bowel syndrome   ? Transverse myelitis ([place])   ? Vitamin D deficiency   ? ? ?Current Outpatient Medications:  ?  ALLERGY RELIEF 10 MG tablet, TAKE ONE TABLET BY MOUTH DAILY, Disp: 30 tablet, Rfl: 11 ?  ALPRAZolam (XANAX) 0.5 MG  tablet, Take 1 tablet (0.5 mg total) by mouth 3 (three) times daily., Disp: 90 tablet, Rfl: 4 ?  Calcium Citrate-Vitamin D (CALCIUM CITRATE + D3) 315-250 MG-UNIT TABS, Take 2 tablets by mouth daily., Disp: 120 tablet, Rfl:  ?  cyclobenzaprine (FLEXERIL) 10 MG tablet, Take 1 tablet (10 mg total) by mouth 3 (three) times daily as needed for muscle spasms., Disp: 30 tablet, Rfl: 0 ?  cyclobenzaprine (FLEXERIL) 5 MG tablet, TAKE ONE TABLET BY MOUTH THREE TIMES DAILY AS NEEDED FOR MUSCLE SPASMS., Disp: 90 tablet, Rfl: 5 ?  fluticasone (FLONASE) 50 MCG/ACT nasal spray, Place 2 sprays into both nostrils daily., Disp: 16 g, Rfl: 9 ?  hydrOXYzine (ATARAX) 10 MG tablet, TAKE 1 TABLET TWICE DAILY., Disp: 60 tablet, Rfl: 3 ?  Hyoscyamine Sulfate SL (LEVSIN/SL) 0.125 MG SUBL, Place 0.125 mg under the tongue every 4 (four) hours as needed (abd cramping and diarrhea)., Disp: 120 tablet, Rfl: 5 ?  Iron-FA-B Cmp-C-Biot-Probiotic (FUSION PLUS) CAPS, Take 1 capsule by mouth daily., Disp: 30 capsule, Rfl: 6 ?  isosorbide mononitrate (IMDUR) 120 MG 24 hr tablet, TAKE TWO (2) TABLETS BY MOUTH ONCE DAILY, Disp: 180 tablet, Rfl: 3 ?  nitroGLYCERIN (NITROSTAT) 0.4 MG SL tablet, DISSOLVE ONE TABLET UNDER TONGUE EVERY 5 MINUTES UP TO 3 DOSES AS NEEDED FOR CHEST PAIN, Disp: 25 tablet,  Rfl: 11 ?  traMADol (ULTRAM) 50 MG tablet, TAKE ONE TABLET BY MOUTH EVERY TWELVE HOURS AS NEEDED., Disp: 60 tablet, Rfl: 1 ?  triamterene-hydrochlorothiazide (DYAZIDE) 37.5-25 MG capsule, TAKE ONE CAPSULE BY MOUTH 3 TIMES PER WEEK, Disp: 90 capsule, Rfl: 3 ?  verapamil (CALAN-SR) 120 MG CR tablet, Take 1 tablet (120 mg total) by mouth at bedtime., Disp: 90 tablet, Rfl: 3 ?Social History  ? ?Socioeconomic History  ? Marital status: Married  ?  Spouse name: Augustin Coupe  ? Number of children: 2  ? Years of education: 51  ? Highest education level: High school graduate  ?Occupational History  ? Occupation: Disabilty  ? Occupation: DISABILITY  ?  Employer: UNEMPLOYED   ?Tobacco Use  ? Smoking status: Former  ?  Packs/day: 0.50  ?  Years: 4.00  ?  Pack years: 2.00  ?  Types: Cigarettes  ?  Start date: 12/07/1968  ?  Quit date: 11/20/1972  ?  Years since quitting: 48.4  ? Smokeless tobacco: Never  ? Tobacco comments:  ?  quit 40 years ago  ?Vaping Use  ? Vaping Use: Never used  ?Substance and Sexual Activity  ? Alcohol use: No  ?  Alcohol/week: 0.0 standard drinks  ? Drug use: No  ? Sexual activity: Not Currently  ?  Birth control/protection: Surgical  ?Other Topics Concern  ? Not on file  ?Social History Narrative  ? Disabled, lives with husband and daughter.  Enjoys shopping.   ? 1 son and 1 daughter.  ? 1 grandson.  ? ?Social Determinants of Health  ? ?Financial Resource Strain: Low Risk   ? Difficulty of Paying Living Expenses: Not hard at all  ?Food Insecurity: No Food Insecurity  ? Worried About Charity fundraiser in the Last Year: Never true  ? Ran Out of Food in the Last Year: Never true  ?Transportation Needs: No Transportation Needs  ? Lack of Transportation (Medical): No  ? Lack of Transportation (Non-Medical): No  ?Physical Activity: Insufficiently Active  ? Days of Exercise per Week: 5 days  ? Minutes of Exercise per Session: 20 min  ?Stress: No Stress Concern Present  ? Feeling of Stress : Only a little  ?Social Connections: Socially Integrated  ? Frequency of Communication with Friends and Family: More than three times a week  ? Frequency of Social Gatherings with Friends and Family: More than three times a week  ? Attends Religious Services: More than 4 times per year  ? Active Member of Clubs or Organizations: Yes  ? Attends Archivist Meetings: More than 4 times per year  ? Marital Status: Married  ?Intimate Partner Violence: Not At Risk  ? Fear of Current or Ex-Partner: No  ? Emotionally Abused: No  ? Physically Abused: No  ? Sexually Abused: No  ? ?Family History  ?Problem Relation Age of Onset  ? Bladder Cancer Mother   ? Heart disease Mother   ?  Hypertension Mother   ? Heart disease Father   ? Stroke Father   ? Hyperlipidemia Sister   ? Depression Sister   ? Diabetes Brother   ? Seizures Brother   ? Hypertension Brother   ? Diabetes Other   ?     Multiple family members on both sides   ? Coronary artery disease Other   ?     Multiple family members on both sides   ? Colon cancer Neg Hx   ? Esophageal cancer Neg Hx   ?  Pancreatic cancer Neg Hx   ? Rectal cancer Neg Hx   ? Stomach cancer Neg Hx   ? Breast cancer Neg Hx   ? ? ?Objective: ?Office vital signs reviewed. ?BP 117/66   Pulse 87   Temp 98 ?F (36.7 ?C) (Temporal)   Ht '5\' 3"'$  (1.6 m)   Wt 159 lb (72.1 kg)   SpO2 100%   BMI 28.17 kg/m?  ? ?Physical Examination:  ?General: Awake, alert, well nourished, No acute distress ?HEENT: Sclera white.  TMs intact bilaterally.  Nares with mild edema and erythema.  Oropharynx with mild erythema.  No exudates. ?Cardio: regular rate and rhythm, S1S2 heard, no murmurs appreciated ?Pulm: clear to auscultation bilaterally, no wheezes, rhonchi or rales; normal work of breathing on room air ? ? ?Assessment/ Plan: ?81 y.o. female  ? ?Acute bacterial sinusitis - Plan: amoxicillin-clavulanate (AUGMENTIN) 875-125 MG tablet, benzonatate (TESSALON PERLES) 100 MG capsule ? ?Sound like it may have started as a viral illness but since she has ongoing symptoms 2 weeks out that are progressive and nonresponsive to OTC medication I am going to empirically treat her with oral antibiotics.  Augmentin twice daily sent.  Tessalon Perles as needed.  Offered COVID and flu testing but she declined this today.  We discussed red flags and symptoms which would warrant further evaluation.  She will follow-up as needed ? ?No orders of the defined types were placed in this encounter. ? ?No orders of the defined types were placed in this encounter. ? ? ? ?Janora Norlander, DO ?[place] ?[phone] ? ? ?

## 2021-04-15 NOTE — Patient Instructions (Signed)
Antibiotic sent to pharmacy ?Cough medication sent ? ? ? ? ?- Get plenty of rest and drink plenty of fluids. ?- Try to breathe moist air. Use a cold mist humidifier. ?- Consume warm fluids (soup or tea) to provide relief for a stuffy nose and to loosen phlegm. ?- For nasal stuffiness, try saline nasal spray or a Neti Pot.  Afrin nasal spray can also be used but this product should not be used longer than 3 days or it will cause rebound nasal stuffiness (worsening nasal congestion). ?- For sore throat pain relief: use chloraseptic spray, suck on throat lozenges, hard candy or popsicles; gargle with warm salt water (1/4 tsp. salt per 8 oz. of water); and eat soft, bland foods. ?- Eat a well-balanced diet. If you cannot, ensure you are getting enough nutrients by taking a daily multivitamin. ?- Avoid dairy products, as they can thicken phlegm. ?- Avoid alcohol, as it impairs your body?s immune system. ? ?CONTACT YOUR DOCTOR IF YOU EXPERIENCE ANY OF THE FOLLOWING: ?- High fever ?- Ear pain ?- Sinus-type headache ?- Unusually severe cold symptoms ?- Cough that gets worse while other cold symptoms improve ?- Flare up of any chronic lung problem, such as asthma ?- Your symptoms persist longer than 2 weeks ? ?

## 2021-04-18 ENCOUNTER — Telehealth: Payer: Self-pay | Admitting: Podiatry

## 2021-04-18 ENCOUNTER — Other Ambulatory Visit: Payer: Self-pay | Admitting: Podiatry

## 2021-04-18 MED ORDER — TRAMADOL HCL 50 MG PO TABS
50.0000 mg | ORAL_TABLET | Freq: Three times a day (TID) | ORAL | 0 refills | Status: AC | PRN
Start: 2021-04-18 — End: 2021-04-23

## 2021-04-18 NOTE — Progress Notes (Unsigned)
tra

## 2021-04-18 NOTE — Telephone Encounter (Signed)
Amy Black is calling with concern for a medication that was called in by Amy Black. Tramadol (Ultram) 50 mg tab - Take 1 tablet (50 mg total) by mouth every 8 (eight) hours as needed for up to 5 days. ? ?Pt has a previous RX on file for the same medication - Tramadol (Ultram) 50 mg - TAKE ONE TABLET BY MOUTH EVERY TWELVE HOURS AS NEEDED ? ?Amy Black would like to know if you wanted to prescribe this on top of what she already has. He states she has not taken the previous one in a while.  ? ?Please advise ? ?

## 2021-04-27 ENCOUNTER — Ambulatory Visit (INDEPENDENT_AMBULATORY_CARE_PROVIDER_SITE_OTHER): Payer: Medicare Other

## 2021-04-27 ENCOUNTER — Other Ambulatory Visit: Payer: Self-pay

## 2021-04-27 ENCOUNTER — Ambulatory Visit: Payer: Medicare Other | Admitting: Podiatry

## 2021-04-27 DIAGNOSIS — R252 Cramp and spasm: Secondary | ICD-10-CM

## 2021-04-27 DIAGNOSIS — R202 Paresthesia of skin: Secondary | ICD-10-CM

## 2021-04-27 DIAGNOSIS — Z8739 Personal history of other diseases of the musculoskeletal system and connective tissue: Secondary | ICD-10-CM | POA: Diagnosis not present

## 2021-04-27 DIAGNOSIS — R200 Anesthesia of skin: Secondary | ICD-10-CM | POA: Diagnosis not present

## 2021-04-27 MED ORDER — LIDOCAINE 5 % EX OINT: 1.0000 "application " | TOPICAL_OINTMENT | CUTANEOUS | 0 refills | Status: DC | PRN

## 2021-04-28 ENCOUNTER — Encounter: Payer: Self-pay | Admitting: Hematology & Oncology

## 2021-04-28 ENCOUNTER — Other Ambulatory Visit: Payer: Self-pay

## 2021-04-28 ENCOUNTER — Inpatient Hospital Stay: Payer: Medicare Other | Attending: Hematology & Oncology

## 2021-04-28 ENCOUNTER — Inpatient Hospital Stay (HOSPITAL_BASED_OUTPATIENT_CLINIC_OR_DEPARTMENT_OTHER): Payer: Medicare Other | Admitting: Hematology & Oncology

## 2021-04-28 VITALS — BP 129/75 | HR 84 | Temp 97.8°F | Resp 18 | Ht 63.0 in | Wt 158.8 lb

## 2021-04-28 DIAGNOSIS — D649 Anemia, unspecified: Secondary | ICD-10-CM | POA: Insufficient documentation

## 2021-04-28 DIAGNOSIS — C801 Malignant (primary) neoplasm, unspecified: Secondary | ICD-10-CM

## 2021-04-28 DIAGNOSIS — D75839 Thrombocytosis, unspecified: Secondary | ICD-10-CM | POA: Insufficient documentation

## 2021-04-28 DIAGNOSIS — D631 Anemia in chronic kidney disease: Secondary | ICD-10-CM

## 2021-04-28 DIAGNOSIS — Z859 Personal history of malignant neoplasm, unspecified: Secondary | ICD-10-CM | POA: Diagnosis not present

## 2021-04-28 DIAGNOSIS — D500 Iron deficiency anemia secondary to blood loss (chronic): Secondary | ICD-10-CM

## 2021-04-28 LAB — CMP (CANCER CENTER ONLY)
ALT: 16 U/L (ref 0–44)
AST: 19 U/L (ref 15–41)
Albumin: 4.3 g/dL (ref 3.5–5.0)
Alkaline Phosphatase: 90 U/L (ref 38–126)
Anion gap: 6 (ref 5–15)
BUN: 10 mg/dL (ref 8–23)
CO2: 31 mmol/L (ref 22–32)
Calcium: 10.4 mg/dL — ABNORMAL HIGH (ref 8.9–10.3)
Chloride: 101 mmol/L (ref 98–111)
Creatinine: 0.78 mg/dL (ref 0.44–1.00)
GFR, Estimated: >60 mL/min (ref >60)
Glucose, Bld: 94 mg/dL (ref 70–99)
Potassium: 4.3 mmol/L (ref 3.5–5.1)
Sodium: 138 mmol/L (ref 135–145)
Total Bilirubin: 0.5 mg/dL (ref 0.3–1.2)
Total Protein: 7.6 g/dL (ref 6.5–8.1)

## 2021-04-28 LAB — CBC WITH DIFFERENTIAL (CANCER CENTER ONLY)
Abs Immature Granulocytes: 0.01 10*3/uL (ref 0.00–0.07)
Basophils Absolute: 0 10*3/uL (ref 0.0–0.1)
Basophils Relative: 1 %
Eosinophils Absolute: 0.1 10*3/uL (ref 0.0–0.5)
Eosinophils Relative: 1 %
HCT: 36.2 % (ref 36.0–46.0)
Hemoglobin: 12 g/dL (ref 12.0–15.0)
Immature Granulocytes: 0 %
Lymphocytes Relative: 32 %
Lymphs Abs: 1.7 10*3/uL (ref 0.7–4.0)
MCH: 32 pg (ref 26.0–34.0)
MCHC: 33.1 g/dL (ref 30.0–36.0)
MCV: 96.5 fL (ref 80.0–100.0)
Monocytes Absolute: 0.5 10*3/uL (ref 0.1–1.0)
Monocytes Relative: 9 %
Neutro Abs: 3 10*3/uL (ref 1.7–7.7)
Neutrophils Relative %: 57 %
Platelet Count: 381 10*3/uL (ref 150–400)
RBC: 3.75 MIL/uL — ABNORMAL LOW (ref 3.87–5.11)
RDW: 12.7 % (ref 11.5–15.5)
WBC Count: 5.3 10*3/uL (ref 4.0–10.5)
nRBC: 0 % (ref 0.0–0.2)

## 2021-04-28 LAB — RETICULOCYTES
Immature Retic Fract: 10.5 % (ref 2.3–15.9)
RBC.: 3.75 MIL/uL — ABNORMAL LOW (ref 3.87–5.11)
Retic Count, Absolute: 76.1 10*3/uL (ref 19.0–186.0)
Retic Ct Pct: 2 % (ref 0.4–3.1)

## 2021-04-28 NOTE — Progress Notes (Signed)
?Hematology and Oncology Follow Up Visit ? ?Amy Black ?[id number] ?29-Jan-1941 81 y.o. ?04/28/2021 ? ? ?Principle Diagnosis:  ?Transient thrombocytosis ?History transverse myelitis ?History of adenosquamous carcinoma of unknown primary ?Iron def anemia ? ?Current Therapy:   ?Fusion Plus -- daily ?  ?Interim History:  Amy Black is here today for follow-up.  We see her every 6 months.  Since we last saw her, she been doing pretty well.  She had no problems over the holiday season.  She is eating well.  She is having no problems with bowels or bladder.  She had no issues with COVID.  She has had no cough or shortness of breath. ? ?Last on that we saw her, her ferritin was 158 with an iron saturation of 39%. ? ?She is on oral iron.  She is doing well on the oral iron.  She is having no abdominal issues. ? ?There is been no rashes.  She has had no leg swelling. ? ?Overall, I would say performance status is probably ECOG 1.   ? ? ?Medications:  ?Allergies as of 04/28/2021   ? ?   Reactions  ? Iodine Itching  ? Ivp Dye [iodinated Contrast Media] Itching  ? Amlodipine Other (See Comments)  ? headaches  ? Lexapro [escitalopram Oxalate] Other (See Comments)  ? Drunk, High feeling  ? Cymbalta [duloxetine Hcl] Other (See Comments)  ? sleepiness  ? Gabapentin Other (See Comments)  ? sleepiness  ? Zanaflex [tizanidine Hcl] Other (See Comments)  ? Very drunk feeling next day  ? ?  ? ?  ?Medication List  ?  ? ?  ? Accurate as of April 28, 2021  3:05 PM. If you have any questions, ask your nurse or doctor.  ?  ?  ? ?  ? ?STOP taking these medications   ? ?amoxicillin-clavulanate 875-125 MG tablet ?Commonly known as: AUGMENTIN ?Stopped by: Volanda Napoleon, MD ?  ?benzonatate 100 MG capsule ?Commonly known as: Best boy ?Stopped by: Volanda Napoleon, MD ?  ? ?  ? ?TAKE these medications   ? ?Allergy Relief 10 MG tablet ?Generic drug: loratadine ?TAKE ONE TABLET BY MOUTH DAILY ?  ?ALPRAZolam 0.5 MG tablet ?Commonly known as:  Duanne Moron ?Take 1 tablet (0.5 mg total) by mouth 3 (three) times daily. ?  ?Calcium Citrate-Vitamin D 315-250 MG-UNIT Tabs ?Commonly known as: Calcium Citrate + D3 ?Take 2 tablets by mouth daily. ?  ?cyclobenzaprine 5 MG tablet ?Commonly known as: FLEXERIL ?TAKE ONE TABLET BY MOUTH THREE TIMES DAILY AS NEEDED FOR MUSCLE SPASMS. ?What changed: Another medication with the same name was removed. Continue taking this medication, and follow the directions you see here. ?Changed by: Volanda Napoleon, MD ?  ?fluticasone 50 MCG/ACT nasal spray ?Commonly known as: FLONASE ?Place 2 sprays into both nostrils daily. ?  ?Fusion Plus Caps ?Take 1 capsule by mouth daily. ?  ?hydrOXYzine 10 MG tablet ?Commonly known as: ATARAX ?TAKE 1 TABLET TWICE DAILY. ?  ?Hyoscyamine Sulfate SL 0.125 MG Subl ?Commonly known as: Levsin/SL ?Place 0.125 mg under the tongue every 4 (four) hours as needed (abd cramping and diarrhea). ?  ?isosorbide mononitrate 120 MG 24 hr tablet ?Commonly known as: IMDUR ?TAKE TWO (2) TABLETS BY MOUTH ONCE DAILY ?  ?lidocaine 5 % ointment ?Commonly known as: XYLOCAINE ?Apply 1 application. topically as needed. ?  ?nitroGLYCERIN 0.4 MG SL tablet ?Commonly known as: NITROSTAT ?DISSOLVE ONE TABLET UNDER TONGUE EVERY 5 MINUTES UP TO 3 DOSES AS NEEDED  FOR CHEST PAIN ?  ?traMADol 50 MG tablet ?Commonly known as: ULTRAM ?TAKE ONE TABLET BY MOUTH EVERY TWELVE HOURS AS NEEDED. ?  ?triamterene-hydrochlorothiazide 37.5-25 MG capsule ?Commonly known as: DYAZIDE ?TAKE ONE CAPSULE BY MOUTH 3 TIMES PER WEEK ?  ?verapamil 120 MG CR tablet ?Commonly known as: CALAN-SR ?Take 1 tablet (120 mg total) by mouth at bedtime. ?  ? ?  ? ? ?Allergies:  ?Allergies  ?Allergen Reactions  ? Iodine Itching  ? Ivp Dye [Iodinated Contrast Media] Itching  ? Amlodipine Other (See Comments)  ?  headaches  ? Lexapro [Escitalopram Oxalate] Other (See Comments)  ?  Drunk, High feeling  ? Cymbalta [Duloxetine Hcl] Other (See Comments)  ?  sleepiness  ?  Gabapentin Other (See Comments)  ?  sleepiness  ? Zanaflex [Tizanidine Hcl] Other (See Comments)  ?  Very drunk feeling next day  ? ? ?Past Medical History, Surgical history, Social history, and Family History were reviewed and updated. ? ?Review of Systems: ?Review of Systems  ?Constitutional: Negative.   ?HENT: Negative.    ?Eyes: Negative.   ?Respiratory: Negative.    ?Cardiovascular: Negative.   ?Gastrointestinal: Negative.   ?Genitourinary: Negative.   ?Musculoskeletal: Negative.   ?Skin: Negative.   ?Neurological: Negative.   ?Endo/Heme/Allergies: Negative.   ?Psychiatric/Behavioral: Negative.    ?  ? ?Physical Exam: ? height is '5\' 3"'$  (1.6 m) and weight is 158 lb 12.8 oz (72 kg). Her oral temperature is 97.8 ?F (36.6 ?C). Her blood pressure is 129/75 and her pulse is 84. Her respiration is 18 and oxygen saturation is 100%.  ? ?Wt Readings from Last 3 Encounters:  ?04/28/21 158 lb 12.8 oz (72 kg)  ?04/15/21 159 lb (72.1 kg)  ?04/04/21 158 lb 6.4 oz (71.8 kg)  ? ? ?Physical Exam ?Vitals reviewed.  ?HENT:  ?   Head: Normocephalic and atraumatic.  ?Eyes:  ?   Pupils: Pupils are equal, round, and reactive to light.  ?Cardiovascular:  ?   Rate and Rhythm: Normal rate and regular rhythm.  ?   Heart sounds: Normal heart sounds.  ?Pulmonary:  ?   Effort: Pulmonary effort is normal.  ?   Breath sounds: Normal breath sounds.  ?Abdominal:  ?   General: Bowel sounds are normal.  ?   Palpations: Abdomen is soft.  ?Musculoskeletal:     ?   General: No tenderness or deformity. Normal range of motion.  ?   Cervical back: Normal range of motion.  ?Lymphadenopathy:  ?   Cervical: No cervical adenopathy.  ?Skin: ?   General: Skin is warm and dry.  ?   Findings: No erythema or rash.  ?Neurological:  ?   Mental Status: She is alert and oriented to person, place, and time.  ?Psychiatric:     ?   Behavior: Behavior normal.     ?   Thought Content: Thought content normal.     ?   Judgment: Judgment normal.  ? ? ? ?Lab Results   ?Component Value Date  ? WBC 5.3 04/28/2021  ? HGB 12.0 04/28/2021  ? HCT 36.2 04/28/2021  ? MCV 96.5 04/28/2021  ? PLT 381 04/28/2021  ? ?Lab Results  ?Component Value Date  ? FERRITIN 158 10/18/2020  ? IRON 92 10/18/2020  ? TIBC 235 (L) 10/18/2020  ? UIBC 143 10/18/2020  ? IRONPCTSAT 39 (H) 10/18/2020  ? ?Lab Results  ?Component Value Date  ? RETICCTPCT 2.0 04/28/2021  ? RBC 3.75 (L) 04/28/2021  ? ?  No results found for: KPAFRELGTCHN, LAMBDASER, KAPLAMBRATIO ?No results found for: IGGSERUM, IGA, IGMSERUM ?No results found for: TOTALPROTELP, ALBUMINELP, A1GS, A2GS, BETS, BETA2SER, GAMS, MSPIKE, SPEI ?  Chemistry   ?   ?Component Value Date/Time  ? NA 138 04/28/2021 1350  ? NA 139 11/23/2020 1600  ? NA 138 01/28/2016 1303  ? K 4.3 04/28/2021 1350  ? K 4.2 07/28/2016 1243  ? K 4.2 01/28/2016 1303  ? CL 101 04/28/2021 1350  ? CL 98 07/28/2016 1243  ? CL 101 07/20/2014 1131  ? CO2 31 04/28/2021 1350  ? CO2 29 07/28/2016 1243  ? CO2 30 (H) 01/28/2016 1303  ? BUN 10 04/28/2021 1350  ? BUN 9 11/23/2020 1600  ? BUN 10.0 01/28/2016 1303  ? CREATININE 0.78 04/28/2021 1350  ? CREATININE 0.94 07/28/2016 1243  ? CREATININE 0.8 01/28/2016 1303  ?    ?Component Value Date/Time  ? CALCIUM 10.4 (H) 04/28/2021 1350  ? CALCIUM 9.9 07/28/2016 1243  ? CALCIUM 10.0 01/28/2016 1303  ? ALKPHOS 90 04/28/2021 1350  ? ALKPHOS 105 07/28/2016 1243  ? ALKPHOS 123 01/28/2016 1303  ? AST 19 04/28/2021 1350  ? AST 13 01/28/2016 1303  ? ALT 16 04/28/2021 1350  ? ALT 12 01/28/2016 1303  ? BILITOT 0.5 04/28/2021 1350  ? BILITOT 0.51 01/28/2016 1303  ?  ? ? ? ?Impression and Plan: Ms. Adsit is a very pleasant 81yo African American female with history of transient thrombocytosis and also history of adenosquamous carcinoma with unknown primary.  ? ?So far, there is no evidence of recurrence of the adenosquamous carcinoma.  This was in her leg.  She had surgery and radiation for this.  This had to be about 18 years ago. ? ?For right now, her anemia is  pretty stable.  As such, we will just watch this.  She will stay on the oral iron. ? ?At this point, I really think that we can let her go from the clinic.  I still think we are adding much to her medical care.  We will

## 2021-04-28 NOTE — Progress Notes (Signed)
?Subjective:  ?Patient ID: Amy Black, female    DOB: 1940/02/11,  MRN: [id number] ? ?No chief complaint on file. ? ? ?81 y.o. female presents with the above complaint.  Patient presents with a new complaint of forefoot numbness tingling.  She states it hurts across the top of the toes.  Is constant pain burning sensation.  She does have a history of back arthritis.  She has not seen anyone else prior to seeing me for this.  She also has not seen a spine specialist either.  She wanted to discuss treatment options for it.  She states this numbness and tingling has been going for quite some time and only happens to the toes.  She wanted to look into options of getting it treated better. ? ? ?Review of Systems: Negative except as noted in the HPI. Denies N/V/F/Ch. ? ?Past Medical History:  ?Diagnosis Date  ? Adenocarcinoma ([place])   ? LEFT LEG  ? Adenosquamous carcinoma   ? left leg 2004 - radiation & resection  ? Allergy   ? Anxiety   ? CAD (coronary artery disease)   ? a. minimal CAD by cath in 2016 with presumed spasm (20% mLAD, 20% D2).  ? Cataract   ? bilateral cataracts removed  ? Coronary artery spasm ([place])   ? Diverticulosis of colon (without mention of hemorrhage)   ? Femoral neck fracture, right, closed, initial encounter 10/04/2015  ? GERD (gastroesophageal reflux disease)   ? hepatic cyst   ? History of nuclear stress test 04/2011  ? lexiscan; normal study, no significant ischemia, low risk; 2015 - normal  ? HTN (hypertension)   ? PT. DENIES  ? Hyperlipidemia   ? Insomnia   ? Irritable bowel syndrome   ? Transverse myelitis ([place])   ? Vitamin D deficiency   ? ? ?Current Outpatient Medications:  ?  lidocaine (XYLOCAINE) 5 % ointment, Apply 1 application. topically as needed., Disp: 35.44 g, Rfl: 0 ?  ALLERGY RELIEF 10 MG tablet, TAKE ONE TABLET BY MOUTH DAILY, Disp: 30 tablet, Rfl: 11 ?  ALPRAZolam (XANAX) 0.5 MG tablet, Take 1 tablet (0.5 mg total) by mouth 3 (three) times daily., Disp: 90 tablet, Rfl: 4 ?   amoxicillin-clavulanate (AUGMENTIN) 875-125 MG tablet, Take 1 tablet by mouth 2 (two) times daily., Disp: 20 tablet, Rfl: 0 ?  benzonatate (TESSALON PERLES) 100 MG capsule, Take 1 capsule (100 mg total) by mouth 3 (three) times daily as needed., Disp: 20 capsule, Rfl: 0 ?  Calcium Citrate-Vitamin D (CALCIUM CITRATE + D3) 315-250 MG-UNIT TABS, Take 2 tablets by mouth daily., Disp: 120 tablet, Rfl:  ?  cyclobenzaprine (FLEXERIL) 10 MG tablet, Take 1 tablet (10 mg total) by mouth 3 (three) times daily as needed for muscle spasms., Disp: 30 tablet, Rfl: 0 ?  cyclobenzaprine (FLEXERIL) 5 MG tablet, TAKE ONE TABLET BY MOUTH THREE TIMES DAILY AS NEEDED FOR MUSCLE SPASMS., Disp: 90 tablet, Rfl: 5 ?  fluticasone (FLONASE) 50 MCG/ACT nasal spray, Place 2 sprays into both nostrils daily., Disp: 16 g, Rfl: 9 ?  hydrOXYzine (ATARAX) 10 MG tablet, TAKE 1 TABLET TWICE DAILY., Disp: 60 tablet, Rfl: 3 ?  Hyoscyamine Sulfate SL (LEVSIN/SL) 0.125 MG SUBL, Place 0.125 mg under the tongue every 4 (four) hours as needed (abd cramping and diarrhea)., Disp: 120 tablet, Rfl: 5 ?  Iron-FA-B Cmp-C-Biot-Probiotic (FUSION PLUS) CAPS, Take 1 capsule by mouth daily., Disp: 30 capsule, Rfl: 6 ?  isosorbide mononitrate (IMDUR) 120 MG 24 hr tablet,  TAKE TWO (2) TABLETS BY MOUTH ONCE DAILY, Disp: 180 tablet, Rfl: 3 ?  nitroGLYCERIN (NITROSTAT) 0.4 MG SL tablet, DISSOLVE ONE TABLET UNDER TONGUE EVERY 5 MINUTES UP TO 3 DOSES AS NEEDED FOR CHEST PAIN, Disp: 25 tablet, Rfl: 11 ?  traMADol (ULTRAM) 50 MG tablet, TAKE ONE TABLET BY MOUTH EVERY TWELVE HOURS AS NEEDED., Disp: 60 tablet, Rfl: 1 ?  triamterene-hydrochlorothiazide (DYAZIDE) 37.5-25 MG capsule, TAKE ONE CAPSULE BY MOUTH 3 TIMES PER WEEK, Disp: 90 capsule, Rfl: 3 ?  verapamil (CALAN-SR) 120 MG CR tablet, Take 1 tablet (120 mg total) by mouth at bedtime., Disp: 90 tablet, Rfl: 3 ? ?Social History  ? ?Tobacco Use  ?Smoking Status Former  ? Packs/day: 0.50  ? Years: 4.00  ? Pack years: 2.00  ?  Types: Cigarettes  ? Start date: 12/07/1968  ? Quit date: 11/20/1972  ? Years since quitting: 48.4  ?Smokeless Tobacco Never  ?Tobacco Comments  ? quit 40 years ago  ? ? ?Allergies  ?Allergen Reactions  ? Iodine Itching  ? Amlodipine Other (See Comments)  ?  headaches  ? Lexapro [Escitalopram Oxalate] Other (See Comments)  ?  Drunk, High feeling  ? Cymbalta [Duloxetine Hcl] Other (See Comments)  ?  sleepiness  ? Gabapentin Other (See Comments)  ?  sleepiness  ? Ivp Dye [Iodinated Contrast Media] Itching  ? Zanaflex [Tizanidine Hcl] Other (See Comments)  ?  Very drunk feeling next day  ? ?Objective:  ?There were no vitals filed for this visit. ?There is no height or weight on file to calculate BMI. ?Constitutional Well developed. ?Well nourished.  ?Vascular Dorsalis pedis pulses palpable bilaterally. ?Posterior tibial pulses palpable bilaterally. ?Capillary refill normal to all digits.  ?No cyanosis or clubbing noted. ?Pedal hair growth normal.  ?Neurologic Normal speech. ?Oriented to person, place, and time. ?Subjective component forefoot numbness tingling noted to forefoot digits 1 through 5.  Negative Tinel's sign to any of the foot and ankle nerves.  Negative common peroneal compression.  ?Dermatologic Nails well groomed and normal in appearance. ?No open wounds. ?No skin lesions.  ?Orthopedic: Manual muscle testing 4 out of 5.  Good strength gross motor and sensory function noted.  ? ?Radiographs: 3 views of skeletally mature the right foot: Generalized osteopenia noted.  No fractures noted.  No bony abnormalities identified.  No stress fracture noted. ?Assessment:  ? ?1. History of back pain   ? ?Plan:  ?Patient was evaluated and treated and all questions answered. ? ?Bilateral numbness tingling with a history of lower back arthritis. ?-I explained the patient the etiology of numbness tingling various treatment options were discussed.  At this time I believe she will benefit from lidocaine ointment to apply once  a day or twice a day to help with the numbness tingling.  I also discussed with her she will benefit from getting evaluated by spine specialist as well.  She states understand will look into it.  I believe numbness tingling to both of her feet may be coming from the lower back.  She agrees with this plan ? ?No follow-ups on file.  ?

## 2021-04-29 LAB — ERYTHROPOIETIN: Erythropoietin: 13.6 m[IU]/mL (ref 2.6–18.5)

## 2021-04-29 LAB — FERRITIN: Ferritin: 281 ng/mL (ref 11–307)

## 2021-05-06 DIAGNOSIS — M5136 Other intervertebral disc degeneration, lumbar region: Secondary | ICD-10-CM | POA: Diagnosis not present

## 2021-05-06 DIAGNOSIS — M5416 Radiculopathy, lumbar region: Secondary | ICD-10-CM | POA: Diagnosis not present

## 2021-05-06 DIAGNOSIS — M549 Dorsalgia, unspecified: Secondary | ICD-10-CM | POA: Diagnosis not present

## 2021-05-09 ENCOUNTER — Other Ambulatory Visit: Payer: Self-pay | Admitting: Orthopaedic Surgery

## 2021-05-09 DIAGNOSIS — M5416 Radiculopathy, lumbar region: Secondary | ICD-10-CM

## 2021-05-09 DIAGNOSIS — M5136 Other intervertebral disc degeneration, lumbar region: Secondary | ICD-10-CM

## 2021-05-24 ENCOUNTER — Ambulatory Visit: Payer: Medicare Other | Admitting: Family Medicine

## 2021-05-29 ENCOUNTER — Ambulatory Visit
Admission: RE | Admit: 2021-05-29 | Discharge: 2021-05-29 | Disposition: A | Discharge status: Home / Self Care | Payer: Medicare Other | Source: Ambulatory Visit | Attending: Orthopaedic Surgery | Admitting: Orthopaedic Surgery

## 2021-05-29 DIAGNOSIS — M48061 Spinal stenosis, lumbar region without neurogenic claudication: Secondary | ICD-10-CM | POA: Diagnosis not present

## 2021-05-29 DIAGNOSIS — M5416 Radiculopathy, lumbar region: Secondary | ICD-10-CM

## 2021-05-29 DIAGNOSIS — M5136 Other intervertebral disc degeneration, lumbar region: Secondary | ICD-10-CM

## 2021-05-31 ENCOUNTER — Other Ambulatory Visit: Payer: Medicare Other

## 2021-06-02 ENCOUNTER — Other Ambulatory Visit: Payer: Self-pay | Admitting: Family Medicine

## 2021-06-06 DIAGNOSIS — M5416 Radiculopathy, lumbar region: Secondary | ICD-10-CM | POA: Diagnosis not present

## 2021-06-07 ENCOUNTER — Ambulatory Visit (INDEPENDENT_AMBULATORY_CARE_PROVIDER_SITE_OTHER): Payer: Medicare Other | Admitting: Family Medicine

## 2021-06-07 ENCOUNTER — Encounter: Payer: Self-pay | Admitting: Family

## 2021-06-07 ENCOUNTER — Encounter: Payer: Self-pay | Admitting: Family Medicine

## 2021-06-07 VITALS — BP 113/63 | HR 81 | Temp 97.7°F | Ht 63.0 in | Wt 157.6 lb

## 2021-06-07 DIAGNOSIS — I10 Essential (primary) hypertension: Secondary | ICD-10-CM | POA: Diagnosis not present

## 2021-06-07 DIAGNOSIS — F419 Anxiety disorder, unspecified: Secondary | ICD-10-CM | POA: Diagnosis not present

## 2021-06-07 NOTE — Progress Notes (Signed)
? ?Subjective:  ?Patient ID: Ezrah Dembeck Heidt, female    DOB: 1940-05-14  Age: 81 y.o. MRN: [id number] ? ?CC: Medical Management of Chronic Issues ? ? ?HPI ?Samone P Paro presents for wants med to help her stay sharp mentally. She denies current symptoms. Looking for prevention. ? ?Saw PA at spine center yesterday. Having pain in foot and leg on right that is coming from her back. Getting an injection in her spine on 5/22.  ? ? presents for  follow-up of hypertension. Patient has no history of headache chest pain or shortness of breath or recent cough. Patient also denies symptoms of TIA such as focal numbness or weakness. Patient denies side effects from medication. States taking it regularly ? ?Anxiety overall unchanged. Using meds as directed. Denies current sx or adverse rxn.  ? ? ? ?  04/04/2021  ?  1:20 PM 11/23/2020  ?  2:44 PM 05/05/2020  ?  5:01 PM 07/03/2019  ?  3:48 PM  ?GAD 7 : Generalized Anxiety Score  ?Nervous, Anxious, on Edge 0 0 0 0  ?Control/stop worrying 0 0 0 0  ?Worry too much - different things 0 0 0 0  ?Trouble relaxing 0 0 0 0  ?Restless 0 0 0 0  ?Easily annoyed or irritable 0 0 0 0  ?Afraid - awful might happen 0 0 0 0  ?Total GAD 7 Score 0 0 0 0  ?Anxiety Difficulty  Not difficult at all Not difficult at all   ? ? ? ?  06/07/2021  ?  2:09 PM 04/04/2021  ?  1:20 PM 02/14/2021  ?  2:54 PM  ?Depression screen PHQ 2/9  ?Decreased Interest 0 0 0  ?Down, Depressed, Hopeless 0 0 0  ?PHQ - 2 Score 0 0 0  ?Altered sleeping  0   ?Tired, decreased energy  0   ?Change in appetite  0   ?Feeling bad or failure about yourself   0   ?Trouble concentrating  0   ?Moving slowly or fidgety/restless  0   ?Suicidal thoughts  0   ?PHQ-9 Score  0   ? ? ? ? ? ?  06/14/2017  ?  3:00 PM 12/07/2015  ?  3:11 PM  ?MMSE - Mini Mental State Exam  ?Orientation to time 5 5  ?Orientation to Place 5 5  ?Registration 3 3  ?Attention/ Calculation 3 3  ?Recall 3 3  ?Language- name 2 objects 2 2  ?Language- repeat 1 1  ?Language- follow  3 step command 3 3  ?Language- read & follow direction 1 1  ?Write a sentence 1 1  ?Copy design 1 1  ?Total score 28 28  ? ? ? ? ?  06/07/2021  ?  2:09 PM 04/04/2021  ?  1:20 PM 02/14/2021  ?  2:54 PM  ?Depression screen PHQ 2/9  ?Decreased Interest 0 0 0  ?Down, Depressed, Hopeless 0 0 0  ?PHQ - 2 Score 0 0 0  ?Altered sleeping  0   ?Tired, decreased energy  0   ?Change in appetite  0   ?Feeling bad or failure about yourself   0   ?Trouble concentrating  0   ?Moving slowly or fidgety/restless  0   ?Suicidal thoughts  0   ?PHQ-9 Score  0   ? ? ?History ?Kirstin has a past medical history of Adenocarcinoma ([place]), Adenosquamous carcinoma, Allergy, Anxiety, CAD (coronary artery disease), Cataract, Coronary artery spasm ([place]), Diverticulosis of colon (without mention  of hemorrhage), Femoral neck fracture, right, closed, initial encounter (10/04/2015), GERD (gastroesophageal reflux disease), hepatic cyst, History of nuclear stress test (04/2011), HTN (hypertension), Hyperlipidemia, Insomnia, Irritable bowel syndrome, Transverse myelitis ([place]), and Vitamin D deficiency.  ? ?She has a past surgical history that includes Resection soft tissue tumor leg / ankle radical (2004); Diagnostic laparoscopy; Lysis of adhesion; Salpingoophorectomy (Left); Pelvic laparoscopy (1992); Cardiac catheterization; transthoracic echocardiogram (07/2012); Cardiac catheterization (N/A, 08/28/2014); Colonoscopy; Upper gastrointestinal endoscopy; Total hip arthroplasty (Right, 10/04/2015); and Abdominal hysterectomy (Bilateral).  ? ?Her family history includes Bladder Cancer in her mother; Coronary artery disease in an other family member; Depression in her sister; Diabetes in her brother and another family member; Heart disease in her father and mother; Hyperlipidemia in her sister; Hypertension in her brother and mother; Seizures in her brother; Stroke in her father.She reports that she quit smoking about 48 years ago. Her smoking use included  cigarettes. She started smoking about 52 years ago. She has a 2.00 pack-year smoking history. She has never used smokeless tobacco. She reports that she does not drink alcohol and does not use drugs. ? ? ? ?ROS ?Review of Systems  ?Constitutional: Negative.   ?HENT: Negative.    ?Eyes:  Negative for visual disturbance.  ?Respiratory:  Negative for shortness of breath.   ?Cardiovascular:  Negative for chest pain.  ?Gastrointestinal:  Negative for abdominal pain.  ?Musculoskeletal:  Negative for arthralgias.  ? ?Objective:  ?BP 113/63   Pulse 81   Temp 97.7 ?F (36.5 ?C)   Ht '5\' 3"'$  (1.6 m)   Wt 157 lb 9.6 oz (71.5 kg)   SpO2 99%   BMI 27.92 kg/m?  ? ?BP Readings from Last 3 Encounters:  ?06/07/21 113/63  ?04/28/21 129/75  ?04/15/21 117/66  ? ? ?Wt Readings from Last 3 Encounters:  ?06/07/21 157 lb 9.6 oz (71.5 kg)  ?04/28/21 158 lb 12.8 oz (72 kg)  ?04/15/21 159 lb (72.1 kg)  ? ? ? ?Physical Exam ?Constitutional:   ?   General: She is not in acute distress. ?   Appearance: She is well-developed.  ?Cardiovascular:  ?   Rate and Rhythm: Normal rate and regular rhythm.  ?Pulmonary:  ?   Breath sounds: Normal breath sounds.  ?Musculoskeletal:     ?   General: Normal range of motion.  ?Skin: ?   General: Skin is warm and dry.  ?Neurological:  ?   Mental Status: She is alert and oriented to person, place, and time.  ? ? ? ? ?Assessment & Plan:  ? ?Aldona was seen today for medical management of chronic issues. ? ?Diagnoses and all orders for this visit: ? ?Anxiety ? ?Primary hypertension ? ? ? ? ? ? ?I am having Liadan P. Inglis maintain her Calcium Citrate-Vitamin D, fluticasone, Fusion Plus, Hyoscyamine Sulfate SL, isosorbide mononitrate, cyclobenzaprine, triamterene-hydrochlorothiazide, nitroGLYCERIN, hydrOXYzine, verapamil, traMADol, ALPRAZolam, Allergy Relief, and lidocaine. ? ?Allergies as of 06/07/2021   ? ?   Reactions  ? Iodine Itching  ? Ivp Dye [iodinated Contrast Media] Itching  ? Amlodipine Other (See  Comments)  ? headaches  ? Lexapro [escitalopram Oxalate] Other (See Comments)  ? Drunk, High feeling  ? Cymbalta [duloxetine Hcl] Other (See Comments)  ? sleepiness  ? Gabapentin Other (See Comments)  ? sleepiness  ? Zanaflex [tizanidine Hcl] Other (See Comments)  ? Very drunk feeling next day  ? ?  ? ?  ?Medication List  ?  ? ?  ? Accurate as of Jun 07, 2021  2:49 PM. If you have any questions, ask your nurse or doctor.  ?  ?  ? ?  ? ?Allergy Relief 10 MG tablet ?Generic drug: loratadine ?TAKE ONE TABLET BY MOUTH DAILY ?  ?ALPRAZolam 0.5 MG tablet ?Commonly known as: Duanne Moron ?Take 1 tablet (0.5 mg total) by mouth 3 (three) times daily. ?  ?Calcium Citrate-Vitamin D 315-250 MG-UNIT Tabs ?Commonly known as: Calcium Citrate + D3 ?Take 2 tablets by mouth daily. ?  ?cyclobenzaprine 5 MG tablet ?Commonly known as: FLEXERIL ?TAKE ONE TABLET BY MOUTH THREE TIMES DAILY AS NEEDED FOR MUSCLE SPASMS. ?  ?fluticasone 50 MCG/ACT nasal spray ?Commonly known as: FLONASE ?Place 2 sprays into both nostrils daily. ?  ?Fusion Plus Caps ?Take 1 capsule by mouth daily. ?  ?hydrOXYzine 10 MG tablet ?Commonly known as: ATARAX ?TAKE 1 TABLET TWICE DAILY. ?  ?Hyoscyamine Sulfate SL 0.125 MG Subl ?Commonly known as: Levsin/SL ?Place 0.125 mg under the tongue every 4 (four) hours as needed (abd cramping and diarrhea). ?  ?isosorbide mononitrate 120 MG 24 hr tablet ?Commonly known as: IMDUR ?TAKE TWO (2) TABLETS BY MOUTH ONCE DAILY ?  ?lidocaine 5 % ointment ?Commonly known as: XYLOCAINE ?Apply 1 application. topically as needed. ?  ?nitroGLYCERIN 0.4 MG SL tablet ?Commonly known as: NITROSTAT ?DISSOLVE ONE TABLET UNDER TONGUE EVERY 5 MINUTES UP TO 3 DOSES AS NEEDED FOR CHEST PAIN ?  ?traMADol 50 MG tablet ?Commonly known as: ULTRAM ?TAKE ONE TABLET BY MOUTH EVERY TWELVE HOURS AS NEEDED. ?  ?triamterene-hydrochlorothiazide 37.5-25 MG capsule ?Commonly known as: DYAZIDE ?TAKE ONE CAPSULE BY MOUTH 3 TIMES PER WEEK ?  ?verapamil 120 MG CR  tablet ?Commonly known as: CALAN-SR ?Take 1 tablet (120 mg total) by mouth at bedtime. ?  ? ?  ? ? ? ?Follow-up: No follow-ups on file. ? ?[name], M.D. ?

## 2021-06-08 LAB — CMP14+EGFR
ALT: 12 IU/L (ref 0–32)
AST: 17 IU/L (ref 0–40)
Albumin/Globulin Ratio: 1.6 (ref 1.2–2.2)
Albumin: 4.5 g/dL (ref 3.7–4.7)
Alkaline Phosphatase: 103 IU/L (ref 44–121)
BUN/Creatinine Ratio: 9 — ABNORMAL LOW (ref 12–28)
BUN: 7 mg/dL — ABNORMAL LOW (ref 8–27)
Bilirubin Total: 0.3 mg/dL (ref 0.0–1.2)
CO2: 29 mmol/L (ref 20–29)
Calcium: 10.1 mg/dL (ref 8.7–10.3)
Chloride: 101 mmol/L (ref 96–106)
Creatinine, Ser: 0.8 mg/dL (ref 0.57–1.00)
Globulin, Total: 2.8 g/dL (ref 1.5–4.5)
Glucose: 87 mg/dL (ref 70–99)
Potassium: 5.1 mmol/L (ref 3.5–5.2)
Sodium: 138 mmol/L (ref 134–144)
Total Protein: 7.3 g/dL (ref 6.0–8.5)
eGFR: 75 mL/min/{1.73_m2} (ref >59)

## 2021-06-08 LAB — CBC WITH DIFFERENTIAL/PLATELET
Basophils Absolute: 0 10*3/uL (ref 0.0–0.2)
Basos: 1 %
EOS (ABSOLUTE): 0.1 10*3/uL (ref 0.0–0.4)
Eos: 2 %
Hematocrit: 35.1 % (ref 34.0–46.6)
Hemoglobin: 11.8 g/dL (ref 11.1–15.9)
Immature Grans (Abs): 0 10*3/uL (ref 0.0–0.1)
Immature Granulocytes: 0 %
Lymphocytes Absolute: 2.2 10*3/uL (ref 0.7–3.1)
Lymphs: 46 %
MCH: 31.2 pg (ref 26.6–33.0)
MCHC: 33.6 g/dL (ref 31.5–35.7)
MCV: 93 fL (ref 79–97)
Monocytes Absolute: 0.6 10*3/uL (ref 0.1–0.9)
Monocytes: 12 %
Neutrophils Absolute: 1.8 10*3/uL (ref 1.4–7.0)
Neutrophils: 39 %
Platelets: 395 10*3/uL (ref 150–450)
RBC: 3.78 x10E6/uL (ref 3.77–5.28)
RDW: 12.1 % (ref 11.7–15.4)
WBC: 4.7 10*3/uL (ref 3.4–10.8)

## 2021-06-08 LAB — LIPID PANEL
Chol/HDL Ratio: 2.6 ratio (ref 0.0–4.4)
Cholesterol, Total: 177 mg/dL (ref 100–199)
HDL: 68 mg/dL (ref >39)
LDL Chol Calc (NIH): 92 mg/dL (ref 0–99)
Triglycerides: 96 mg/dL (ref 0–149)
VLDL Cholesterol Cal: 17 mg/dL (ref 5–40)

## 2021-06-08 NOTE — Progress Notes (Signed)
Hello Amy Black,  Your lab result is normal and/or stable.Some minor variations that are not significant are commonly marked abnormal, but do not represent any medical problem for you.  Best regards, Faust Thorington, M.D.

## 2021-06-22 DIAGNOSIS — M5416 Radiculopathy, lumbar region: Secondary | ICD-10-CM | POA: Diagnosis not present

## 2021-06-27 DIAGNOSIS — M5416 Radiculopathy, lumbar region: Secondary | ICD-10-CM | POA: Diagnosis not present

## 2021-07-07 NOTE — Progress Notes (Unsigned)
Cardiology Office Note   Date:  07/08/2021   ID:  Tiersa Dayley Puzzo, DOB 1940-10-02, MRN [id number]  PCP:  [name], MD  Cardiologist:   Minus Breeding, MD   Chief Complaint  Patient presents with   Fatigue      History of Present Illness: Amy Black is a 81 y.o. Black who presents for a follow up of presumed coronary spasm. On 08/28/2014, she had left heart cath that showed minimal CAD, 20% disease in LAD was normal LVEF. She also had history of palpitations and hypertension. She has been on verapamil for greater than 25 years.  She has had recurrent chest pain and has been treated with increased doses of Imdur.  She had relief of her pain after being taken off of verapamil and started on Cardizem.   She was having presyncope.   She wore a monitor for 2 weeks and had runs of SVT that did not correlate with her episodes of weakness.    Since I last saw her she has had some weakness.  She describes this as about 3 weeks of fatigue.  She says that she will go out in her kitchen and then she gets tired and has to go back in her bedroom.   She is not describing presyncope or syncope however.  She is not describing chest pressure, neck or arm discomfort.She is not describing any presyncope or syncope however.  She has some vague arm discomfort on the right side that goes around her right back.  She has to walk with a cane because of transverse myelitis.    Past Medical History:  Diagnosis Date   Adenocarcinoma ([place])    LEFT LEG   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 minimal CAD by cath in 2016 with presumed spasm (20% mLAD, 20% D2).   Cataract    bilateral cataracts removed   Coronary artery spasm ([facility])    Diverticulosis of colon (without mention of hemorrhage)    Femoral neck fracture, right, closed, initial encounter 10/04/2015   GERD (gastroesophageal reflux disease)    hepatic cyst     History of nuclear stress test 04/2011   lexiscan; normal study, no significant ischemia, low risk; 2015 - normal   HTN (hypertension)    PT. DENIES   Hyperlipidemia    Insomnia    Irritable bowel syndrome    Transverse myelitis ([place])    Vitamin D deficiency     Past Surgical History:  Procedure Laterality Date   ABDOMINAL HYSTERECTOMY Bilateral    CARDIAC CATHETERIZATION     coronary spasm   CARDIAC CATHETERIZATION N/A 08/28/2014   Procedure: Left Heart Cath and Coronary Angiography;  Surgeon: Jettie Booze, MD;  Location: [facility];  Service: Cardiovascular;  Laterality: N/A;   COLONOSCOPY     DIAGNOSTIC LAPAROSCOPY     LYSIS OF ADHESION     PELVIC LAPAROSCOPY  1992   RSO, AND LSO ON 2007   RESECTION SOFT TISSUE TUMOR LEG / ANKLE RADICAL  2004   leg lesion resection & radiation   SALPINGOOPHORECTOMY Left    TOTAL HIP ARTHROPLASTY Right 10/04/2015   Procedure: TOTAL HIP ARTHROPLASTY ANTERIOR APPROACH;  Surgeon: Rod Can, MD;  Location: [facility];  Service: Orthopedics;  Laterality: Right;   TRANSTHORACIC ECHOCARDIOGRAM  07/2012   LV cavity size mildly reduced,  normal wall motion; MV with calcified annulus and mild MR; LA mildly dilated; atrial septum with increased thickness - lipomatous hypertrophy; RV systolic pressure increased (borderline pulm HTN)   UPPER GASTROINTESTINAL ENDOSCOPY       Current Outpatient Medications  Medication Sig Dispense Refill   ALLERGY RELIEF 10 MG tablet TAKE ONE TABLET BY MOUTH DAILY 30 tablet 11   fluticasone (FLONASE) 50 MCG/ACT nasal spray Place 2 sprays into both nostrils daily. 16 g 9   hydrOXYzine (ATARAX) 10 MG tablet TAKE 1 TABLET TWICE DAILY. 60 tablet 3   isosorbide mononitrate (IMDUR) 120 MG 24 hr tablet TAKE TWO (2) TABLETS BY MOUTH ONCE DAILY 180 tablet 3   nitroGLYCERIN (NITROSTAT) 0.4 MG SL tablet DISSOLVE ONE TABLET UNDER TONGUE EVERY 5 MINUTES UP TO 3 DOSES AS NEEDED FOR CHEST PAIN 25 tablet 11   traMADol  (ULTRAM) 50 MG tablet TAKE ONE TABLET BY MOUTH EVERY TWELVE HOURS AS NEEDED. 60 tablet 1   triamterene-hydrochlorothiazide (DYAZIDE) 37.5-25 MG capsule TAKE ONE CAPSULE BY MOUTH 3 TIMES PER WEEK 90 capsule 3   verapamil (CALAN-SR) 120 MG CR tablet Take 1 tablet (120 mg total) by mouth at bedtime. 90 tablet 3   ALPRAZolam (XANAX) 0.5 MG tablet Take 1 tablet (0.5 mg total) by mouth 3 (three) times daily. (Patient not taking: Reported on 07/08/2021) 90 tablet 4   Calcium Citrate-Vitamin D (CALCIUM CITRATE + D3) 315-250 MG-UNIT TABS Take 2 tablets by mouth daily. (Patient not taking: Reported on 07/08/2021) 120 tablet    cyclobenzaprine (FLEXERIL) 5 MG tablet TAKE ONE TABLET BY MOUTH THREE TIMES DAILY AS NEEDED FOR MUSCLE SPASMS. (Patient not taking: Reported on 07/08/2021) 90 tablet 5   Hyoscyamine Sulfate SL (LEVSIN/SL) 0.125 MG SUBL Place 0.125 mg under the tongue every 4 (four) hours as needed (abd cramping and diarrhea). (Patient not taking: Reported on 07/08/2021) 120 tablet 5   Iron-FA-B Cmp-C-Biot-Probiotic (FUSION PLUS) CAPS Take 1 capsule by mouth daily. (Patient not taking: Reported on 07/08/2021) 30 capsule 6   lidocaine (XYLOCAINE) 5 % ointment Apply 1 application. topically as needed. (Patient not taking: Reported on 07/08/2021) 35.44 g 0   meloxicam (MOBIC) 7.5 MG tablet Take 7.5 mg by mouth daily. (Patient not taking: Reported on 07/08/2021)     No current facility-administered medications for this visit.    Allergies:   Iodine, Ivp dye [iodinated contrast media], Amlodipine, Lexapro [escitalopram oxalate], Cymbalta [duloxetine hcl], Gabapentin, and Zanaflex [tizanidine hcl]   ROS:  Please see the history of present illness.   Otherwise, review of systems are positive for none.   All other systems are reviewed and negative.    PHYSICAL EXAM: VS:  BP 124/86 (BP Location: Right Arm, Patient Position: Sitting, Cuff Size: Normal)   Pulse 76   Ht '5\' 3"'$  (1.6 m)   Wt 156 lb (70.8 kg)   SpO2 97%   BMI  27.63 kg/m  , BMI Body mass index is 27.63 kg/m. GENERAL:  Well appearing NECK:  No jugular venous distention, waveform within normal limits, carotid upstroke brisk and symmetric, no bruits, no thyromegaly LUNGS:  Clear to auscultation bilaterally CHEST:  Unremarkable HEART:  PMI not displaced or sustained,S1 and S2 within normal limits, no S3, no S4, no clicks, no rubs, soft apical systolic murmur nonradiating and not increasing with the strain phase of Valsalva, no diastolic murmurs ABD:  Flat, positive bowel sounds normal in frequency in pitch, no bruits, no rebound, no guarding, no midline pulsatile mass, no hepatomegaly,  no splenomegaly EXT:  2 plus pulses throughout, no edema, no cyanosis no clubbing   EKG:  EKG is  ordered today. Sinus rhythm, rate 76, axis within normal limits, intervals within normal limits, no acute ST-T wave changes.   Recent Labs: 06/07/2021: ALT 12; BUN 7; Creatinine, Ser 0.80; Hemoglobin 11.8; Platelets 395; Potassium 5.1; Sodium 138    Lipid Panel    Component Value Date/Time   CHOL 177 06/07/2021 1452   CHOL 203 (H) 08/29/2012 1316   TRIG 96 06/07/2021 1452   TRIG 131 08/29/2012 1316   HDL 68 06/07/2021 1452   HDL 68 08/29/2012 1316   CHOLHDL 2.6 06/07/2021 1452   LDLCALC 92 06/07/2021 1452   LDLCALC 109 (H) 08/29/2012 1316      Wt Readings from Last 3 Encounters:  07/08/21 156 lb (70.8 kg)  06/07/21 157 lb 9.6 oz (71.5 kg)  04/28/21 158 lb 12.8 oz (72 kg)      Other studies Reviewed: Additional studies/ records that were reviewed today include: Labs. Review of the above records demonstrates:See elsewhere  ASSESSMENT AND PLAN:    Coronary Spasm:    She has no symptoms consistent with angina.  She is not having any of the discomfort she was having previously.  No change in therapy.  No further imaging.  HTN:   Her blood pressure is controlled.  No change in therapy.  Fatigue: She is not anemic.  Thyroid was unremarkable last month.   She had previously worn a monitor and there were no significant arrhythmias on that.  At this point I do not have a clear etiology and no further cardiac work-up is suggested.  This seems to be episodic.   Current medicines are reviewed at length with the patient today.  The patient does not have concerns regarding medicines.  The following changes have been made: None  Labs/ tests ordered today include:   None  Orders Placed This Encounter  Procedures   EKG 12-Lead      Disposition:   FU with me 12 months. Ronnell Guadalajara, MD  07/08/2021 2:04 PM    [place]

## 2021-07-08 ENCOUNTER — Ambulatory Visit: Payer: Medicare Other | Admitting: Cardiology

## 2021-07-08 ENCOUNTER — Encounter: Payer: Self-pay | Admitting: Cardiology

## 2021-07-08 VITALS — BP 124/86 | HR 76 | Ht 63.0 in | Wt 156.0 lb

## 2021-07-08 DIAGNOSIS — I201 Angina pectoris with documented spasm: Secondary | ICD-10-CM | POA: Diagnosis not present

## 2021-07-08 DIAGNOSIS — I10 Essential (primary) hypertension: Secondary | ICD-10-CM

## 2021-07-08 NOTE — Patient Instructions (Signed)
Medication Instructions:  Your physician recommends that you continue on your current medications as directed. Please refer to the Current Medication list given to you today.  *If you need a refill on your cardiac medications before your next appointment, please call your pharmacy*    Follow-Up: At [facility], you and your health needs are our priority.  As part of our continuing mission to provide you with exceptional heart care, we have created designated Provider Care Teams.  These Care Teams include your primary Cardiologist (physician) and Advanced Practice Providers (APPs -  Physician Assistants and Nurse Practitioners) who all work together to provide you with the care you need, when you need it.  We recommend signing up for the patient portal called "MyChart".  Sign up information is provided on this After Visit Summary.  MyChart is used to connect with patients for Virtual Visits (Telemedicine).  Patients are able to view lab/test results, encounter notes, upcoming appointments, etc.  Non-urgent messages can be sent to your provider as well.   To learn more about what you can do with MyChart, go to [email or web address].    Your next appointment:   12 month(s)  The format for your next appointment:   In Person  Provider:   Minus Breeding, MD  Other Instructions In [place]

## 2021-07-11 ENCOUNTER — Encounter: Payer: Self-pay | Admitting: Family Medicine

## 2021-07-11 ENCOUNTER — Ambulatory Visit (INDEPENDENT_AMBULATORY_CARE_PROVIDER_SITE_OTHER): Payer: Medicare Other | Admitting: Family Medicine

## 2021-07-11 DIAGNOSIS — R531 Weakness: Secondary | ICD-10-CM | POA: Diagnosis not present

## 2021-07-11 NOTE — Progress Notes (Signed)
Subjective:    Patient ID: Amy Black, female    DOB: 1940/04/16, 81 y.o.   MRN: [id number]   HPI: Amy Black is a 81 y.o. female presenting for  being  Fatigued. Saw heart doctor last week. He said everything looked good. Transverse myelitis hx. Wants referral. Some weakness in the legs. Not clearly sudden in onset. Good control over bowel and bladder.       06/07/2021    2:09 PM 04/04/2021    1:20 PM 02/14/2021    2:54 PM 01/10/2021    3:15 PM 11/23/2020    2:44 PM  Depression screen PHQ 2/9  Decreased Interest 0 0 0 0 0  Down, Depressed, Hopeless 0 0 0 0 0  PHQ - 2 Score 0 0 0 0 0  Altered sleeping  0     Tired, decreased energy  0     Change in appetite  0     Feeling bad or failure about yourself   0     Trouble concentrating  0     Moving slowly or fidgety/restless  0     Suicidal thoughts  0     PHQ-9 Score  0        Relevant past medical, surgical, family and social history reviewed and updated as indicated.  Interim medical history since our last visit reviewed. Allergies and medications reviewed and updated.  ROS:  Review of Systems  Constitutional:  Positive for fatigue.  HENT: Negative.    Eyes:  Negative for visual disturbance.  Respiratory:  Negative for shortness of breath.   Cardiovascular:  Negative for chest pain.  Gastrointestinal:  Negative for abdominal pain.  Genitourinary:  Positive for frequency. Negative for enuresis.  Musculoskeletal:  Negative for arthralgias.  Neurological:  Positive for weakness (nonfocal).    Social History   Tobacco Use  Smoking Status Former   Packs/day: 0.50   Years: 4.00   Pack years: 2.00   Types: Cigarettes   Start date: 12/07/1968   Quit date: 11/20/1972   Years since quitting: 48.6  Smokeless Tobacco Never  Tobacco Comments   quit 40 years ago       Objective:     Wt Readings from Last 3 Encounters:  07/08/21 156 lb (70.8 kg)  06/07/21 157 lb 9.6 oz (71.5 kg)  04/28/21 158 lb 12.8 oz  (72 kg)     Exam deferred. Pt. Harboring due to COVID 19. Phone visit performed.   Assessment & Plan:   1. Weakness     No orders of the defined types were placed in this encounter.   Orders Placed This Encounter  Procedures   Ambulatory referral to Neurology    Referral Priority:   Routine    Referral Type:   Consultation    Referral Reason:   Specialty Services Required    Requested Specialty:   Neurology    Number of Visits Requested:   1      Diagnoses and all orders for this visit:  Weakness -     Ambulatory referral to Neurology    Virtual Visit via telephone Note  I discussed the limitations, risks, security and privacy concerns of performing an evaluation and management service by telephone and the availability of in person appointments. The patient was identified with two identifiers. Pt.expressed understanding and agreed to proceed. Pt. Is at home. Dr. Livia Snellen is in his office.  Follow Up Instructions:   I discussed the assessment and  treatment plan with the patient. The patient was provided an opportunity to ask questions and all were answered. The patient agreed with the plan and demonstrated an understanding of the instructions.   The patient was advised to call back or seek an in-person evaluation if the symptoms worsen or if the condition fails to improve as anticipated.   Total minutes including chart review and phone contact time: 14   Follow up plan: Return if symptoms worsen or fail to improve.  Amy Fraise, MD [place]

## 2021-07-13 ENCOUNTER — Encounter: Payer: Self-pay | Admitting: Neurology

## 2021-07-20 DIAGNOSIS — M5416 Radiculopathy, lumbar region: Secondary | ICD-10-CM | POA: Diagnosis not present

## 2021-07-27 DIAGNOSIS — H43811 Vitreous degeneration, right eye: Secondary | ICD-10-CM | POA: Diagnosis not present

## 2021-07-27 DIAGNOSIS — H35372 Puckering of macula, left eye: Secondary | ICD-10-CM | POA: Diagnosis not present

## 2021-07-27 DIAGNOSIS — H04123 Dry eye syndrome of bilateral lacrimal glands: Secondary | ICD-10-CM | POA: Diagnosis not present

## 2021-08-02 ENCOUNTER — Other Ambulatory Visit: Payer: Self-pay | Admitting: Cardiology

## 2021-08-02 DIAGNOSIS — I208 Other forms of angina pectoris: Secondary | ICD-10-CM

## 2021-08-10 DIAGNOSIS — M5416 Radiculopathy, lumbar region: Secondary | ICD-10-CM | POA: Diagnosis not present

## 2021-09-07 ENCOUNTER — Ambulatory Visit: Payer: Medicare Other | Admitting: Family Medicine

## 2021-09-19 ENCOUNTER — Encounter: Payer: Self-pay | Admitting: Family Medicine

## 2021-09-19 ENCOUNTER — Ambulatory Visit (INDEPENDENT_AMBULATORY_CARE_PROVIDER_SITE_OTHER): Payer: Medicare Other | Admitting: Family Medicine

## 2021-09-19 VITALS — BP 131/76 | HR 69 | Temp 97.4°F | Wt 160.6 lb

## 2021-09-19 DIAGNOSIS — R072 Precordial pain: Secondary | ICD-10-CM

## 2021-09-19 DIAGNOSIS — F419 Anxiety disorder, unspecified: Secondary | ICD-10-CM

## 2021-09-19 DIAGNOSIS — G373 Acute transverse myelitis in demyelinating disease of central nervous system: Secondary | ICD-10-CM | POA: Diagnosis not present

## 2021-09-19 MED ORDER — TRAMADOL HCL 50 MG PO TABS: ORAL_TABLET | ORAL | 1 refills | Status: DC

## 2021-09-19 MED ORDER — ALPRAZOLAM 0.5 MG PO TABS: 0.5000 mg | ORAL_TABLET | Freq: Three times a day (TID) | ORAL | 5 refills | Status: DC

## 2021-09-19 MED ORDER — MOMETASONE FUROATE 0.1 % EX CREA: 1.0000 | TOPICAL_CREAM | Freq: Every day | CUTANEOUS | 5 refills | Status: DC

## 2021-09-19 NOTE — Progress Notes (Signed)
Subjective:  Patient ID: Amy Black, female    DOB: 08/28/1940  Age: 81 y.o. MRN: [id number]  CC: Medical Management of Chronic Issues   HPI [name] presents for chest pain last night. In bed. No radiation. Took 2 nitroglycerin. Relief occurred after the second. Lasted 5-10 minutes. Occurs infrequently. Nonexertional No radiation or other symptoms noted.      06/07/2021    2:09 PM 04/04/2021    1:20 PM 02/14/2021    2:54 PM  Depression screen PHQ 2/9  Decreased Interest 0 0 0  Down, Depressed, Hopeless 0 0 0  PHQ - 2 Score 0 0 0  Altered sleeping  0   Tired, decreased energy  0   Change in appetite  0   Feeling bad or failure about yourself   0   Trouble concentrating  0   Moving slowly or fidgety/restless  0   Suicidal thoughts  0   PHQ-9 Score  0     History [name] has a past medical history of Adenocarcinoma ([place]), Adenosquamous carcinoma, Allergy, Anxiety, CAD (coronary artery disease), Cataract, Coronary artery spasm ([place]), Diverticulosis of colon (without mention of hemorrhage), Femoral neck fracture, right, closed, initial encounter (10/04/2015), GERD (gastroesophageal reflux disease), hepatic cyst, History of nuclear stress test (04/2011), HTN (hypertension), Hyperlipidemia, Insomnia, Irritable bowel syndrome, Transverse myelitis ([place]), and Vitamin D deficiency.   She has a past surgical history that includes Resection soft tissue tumor leg / ankle radical (2004); Diagnostic laparoscopy; Lysis of adhesion; Salpingoophorectomy (Left); Pelvic laparoscopy (1992); Cardiac catheterization; transthoracic echocardiogram (07/2012); Cardiac catheterization (N/A, 08/28/2014); Colonoscopy; Upper gastrointestinal endoscopy; Total hip arthroplasty (Right, 10/04/2015); and Abdominal hysterectomy (Bilateral).   Her family history includes Bladder Cancer in her mother; Coronary artery disease in an other family member; Depression in her sister; Diabetes in her brother and another  family member; Heart disease in her father and mother; Hyperlipidemia in her sister; Hypertension in her brother and mother; Seizures in her brother; Stroke in her father.She reports that she quit smoking about 48 years ago. Her smoking use included cigarettes. She started smoking about 52 years ago. She has a 2.00 pack-year smoking history. She has never used smokeless tobacco. She reports that she does not drink alcohol and does not use drugs.    ROS Review of Systems  Constitutional: Negative.   HENT: Negative.    Eyes:  Negative for visual disturbance.  Respiratory:  Negative for shortness of breath.   Cardiovascular:  Positive for chest pain.  Gastrointestinal:  Negative for abdominal pain.  Musculoskeletal:  Negative for arthralgias.  Psychiatric/Behavioral:  The patient is nervous/anxious.     Objective:  BP 131/76   Pulse 69   Temp (!) 97.4 F (36.3 C)   Wt 160 lb 9.6 oz (72.8 kg)   SpO2 99%   BMI 28.45 kg/m   BP Readings from Last 3 Encounters:  09/19/21 131/76  07/08/21 124/86  06/07/21 113/63    Wt Readings from Last 3 Encounters:  09/19/21 160 lb 9.6 oz (72.8 kg)  07/08/21 156 lb (70.8 kg)  06/07/21 157 lb 9.6 oz (71.5 kg)     Physical Exam Constitutional:      General: She is not in acute distress.    Appearance: She is well-developed.  Cardiovascular:     Rate and Rhythm: Normal rate and regular rhythm.  Pulmonary:     Breath sounds: Normal breath sounds.  Musculoskeletal:        General: Normal range of  motion.  Skin:    General: Skin is warm and dry.  Neurological:     Mental Status: She is alert and oriented to person, place, and time.     EKG: No ischemic changes  Assessment & Plan:   Janin was seen today for medical management of chronic issues.  Diagnoses and all orders for this visit:  Precordial pain -     EKG 12-Lead  Anxiety -     ALPRAZolam (XANAX) 0.5 MG tablet; Take 1 tablet (0.5 mg total) by mouth 3 (three) times  daily.  Transverse myelitis (HCC) -     traMADol (ULTRAM) 50 MG tablet; TAKE ONE TABLET BY MOUTH EVERY TWELVE HOURS AS NEEDED.  Other orders -     mometasone (ELOCON) 0.1 % cream; Apply 1 Application topically daily. To affected eye brow area       I am having [name] start on mometasone. I am also having her maintain her Calcium Citrate-Vitamin D, fluticasone, Fusion Plus, Hyoscyamine Sulfate SL, cyclobenzaprine, triamterene-hydrochlorothiazide, nitroGLYCERIN, hydrOXYzine, verapamil, Allergy Relief, lidocaine, meloxicam, isosorbide mononitrate, ALPRAZolam, and traMADol.  Allergies as of 09/19/2021       Reactions   Iodine Itching   Ivp Dye [iodinated Contrast Media] Itching   Amlodipine Other (See Comments)   headaches   Lexapro [escitalopram Oxalate] Other (See Comments)   Drunk, High feeling   Cymbalta [duloxetine Hcl] Other (See Comments)   sleepiness   Gabapentin Other (See Comments)   sleepiness   Zanaflex [tizanidine Hcl] Other (See Comments)   Very drunk feeling next day        Medication List        Accurate as of September 19, 2021 12:01 PM. If you have any questions, ask your nurse or doctor.          Allergy Relief 10 MG tablet Generic drug: loratadine TAKE ONE TABLET BY MOUTH DAILY   ALPRAZolam 0.5 MG tablet Commonly known as: XANAX Take 1 tablet (0.5 mg total) by mouth 3 (three) times daily.   Calcium Citrate-Vitamin D 315-250 MG-UNIT Tabs Commonly known as: Calcium Citrate + D3 Take 2 tablets by mouth daily.   cyclobenzaprine 5 MG tablet Commonly known as: FLEXERIL TAKE ONE TABLET BY MOUTH THREE TIMES DAILY AS NEEDED FOR MUSCLE SPASMS.   fluticasone 50 MCG/ACT nasal spray Commonly known as: FLONASE Place 2 sprays into both nostrils daily.   Fusion Plus Caps Take 1 capsule by mouth daily.   hydrOXYzine 10 MG tablet Commonly known as: ATARAX TAKE 1 TABLET TWICE DAILY.   Hyoscyamine Sulfate SL 0.125 MG Subl Commonly known as:  Levsin/SL Place 0.125 mg under the tongue every 4 (four) hours as needed (abd cramping and diarrhea).   isosorbide mononitrate 120 MG 24 hr tablet Commonly known as: IMDUR TAKE TWO (2) TABLETS BY MOUTH ONCE DAILY   lidocaine 5 % ointment Commonly known as: XYLOCAINE Apply 1 application. topically as needed.   meloxicam 7.5 MG tablet Commonly known as: MOBIC Take 7.5 mg by mouth daily.   mometasone 0.1 % cream Commonly known as: Elocon Apply 1 Application topically daily. To affected eye brow area Started by: [name], MD   nitroGLYCERIN 0.4 MG SL tablet Commonly known as: NITROSTAT DISSOLVE ONE TABLET UNDER TONGUE EVERY 5 MINUTES UP TO 3 DOSES AS NEEDED FOR CHEST PAIN   traMADol 50 MG tablet Commonly known as: ULTRAM TAKE ONE TABLET BY MOUTH EVERY TWELVE HOURS AS NEEDED.   triamterene-hydrochlorothiazide 37.5-25 MG capsule Commonly known  as: DYAZIDE TAKE ONE CAPSULE BY MOUTH 3 TIMES PER WEEK   verapamil 120 MG CR tablet Commonly known as: CALAN-SR Take 1 tablet (120 mg total) by mouth at bedtime.         Follow-up: No follow-ups on file.  [name], M.D.

## 2021-09-20 ENCOUNTER — Telehealth: Payer: Self-pay | Admitting: Family Medicine

## 2021-09-29 ENCOUNTER — Ambulatory Visit (INDEPENDENT_AMBULATORY_CARE_PROVIDER_SITE_OTHER): Payer: Medicare Other | Admitting: Family Medicine

## 2021-09-29 ENCOUNTER — Encounter: Payer: Self-pay | Admitting: Family Medicine

## 2021-09-29 DIAGNOSIS — J0100 Acute maxillary sinusitis, unspecified: Secondary | ICD-10-CM | POA: Diagnosis not present

## 2021-09-29 MED ORDER — PSEUDOEPHEDRINE-GUAIFENESIN ER 60-600 MG PO TB12: 1.0000 | ORAL_TABLET | Freq: Two times a day (BID) | ORAL | 0 refills | Status: AC

## 2021-09-29 MED ORDER — AMOXICILLIN-POT CLAVULANATE 875-125 MG PO TABS: 1.0000 | ORAL_TABLET | Freq: Two times a day (BID) | ORAL | 0 refills | Status: DC

## 2021-09-29 NOTE — Progress Notes (Signed)
Subjective:    Patient ID: Amy Black, female    DOB: 02-Oct-1940, 81 y.o.   MRN: [id number]   HPI: Amy Black is a 81 y.o. female presenting for Symptoms include congestion, facial pain, Forehead pain. Nasal congestion, non productive cough, post nasal drip and sinus pressure. There was fever yesterday. Onset of symptoms was 2 days ago, gradually worsening since that time.        06/07/2021    2:09 PM 04/04/2021    1:20 PM 02/14/2021    2:54 PM 01/10/2021    3:15 PM 11/23/2020    2:44 PM  Depression screen PHQ 2/9  Decreased Interest 0 0 0 0 0  Down, Depressed, Hopeless 0 0 0 0 0  PHQ - 2 Score 0 0 0 0 0  Altered sleeping  0     Tired, decreased energy  0     Change in appetite  0     Feeling bad or failure about yourself   0     Trouble concentrating  0     Moving slowly or fidgety/restless  0     Suicidal thoughts  0     PHQ-9 Score  0        Relevant past medical, surgical, family and social history reviewed and updated as indicated.  Interim medical history since our last visit reviewed. Allergies and medications reviewed and updated.  ROS:  Review of Systems  Constitutional:  Negative for appetite change, chills, diaphoresis, fatigue and fever.  HENT:  Positive for congestion. Negative for ear pain, hearing loss, postnasal drip, rhinorrhea, sore throat and trouble swallowing.   Respiratory:  Positive for cough (dry). Negative for chest tightness and shortness of breath.   Cardiovascular:  Negative for chest pain and palpitations.  Gastrointestinal:  Negative for abdominal pain, diarrhea, nausea and vomiting.  Musculoskeletal:  Negative for arthralgias.  Skin:  Negative for rash.  Neurological:  Positive for headaches.     Social History   Tobacco Use  Smoking Status Former   Packs/day: 0.50   Years: 4.00   Total pack years: 2.00   Types: Cigarettes   Start date: 12/07/1968   Quit date: 11/20/1972   Years since quitting: 48.8  Smokeless Tobacco  Never  Tobacco Comments   quit 40 years ago       Objective:     Wt Readings from Last 3 Encounters:  09/19/21 160 lb 9.6 oz (72.8 kg)  07/08/21 156 lb (70.8 kg)  06/07/21 157 lb 9.6 oz (71.5 kg)     Exam deferred. Pt. Harboring due to COVID 19. Phone visit performed.   Assessment & Plan:   1. Acute maxillary sinusitis, recurrence not specified     Meds ordered this encounter  Medications   amoxicillin-clavulanate (AUGMENTIN) 875-125 MG tablet    Sig: Take 1 tablet by mouth 2 (two) times daily. Take all of this medication    Dispense:  20 tablet    Refill:  0   pseudoephedrine-guaifenesin (MUCINEX D) 60-600 MG 12 hr tablet    Sig: Take 1 tablet by mouth every 12 (twelve) hours for 10 days. As needed for congestion    Dispense:  20 tablet    Refill:  0    No orders of the defined types were placed in this encounter.     Diagnoses and all orders for this visit:  Acute maxillary sinusitis, recurrence not specified  Other orders -     amoxicillin-clavulanate (AUGMENTIN)  875-125 MG tablet; Take 1 tablet by mouth 2 (two) times daily. Take all of this medication -     pseudoephedrine-guaifenesin (MUCINEX D) 60-600 MG 12 hr tablet; Take 1 tablet by mouth every 12 (twelve) hours for 10 days. As needed for congestion    Virtual Visit via telephone Note  I discussed the limitations, risks, security and privacy concerns of performing an evaluation and management service by telephone and the availability of in person appointments. The patient was identified with two identifiers. Pt.expressed understanding and agreed to proceed. Pt. Is at home. Dr. Livia Snellen is in his office.  Follow Up Instructions:   I discussed the assessment and treatment plan with the patient. The patient was provided an opportunity to ask questions and all were answered. The patient agreed with the plan and demonstrated an understanding of the instructions.   The patient was advised to call back or seek  an in-person evaluation if the symptoms worsen or if the condition fails to improve as anticipated.   Total minutes including chart review and phone contact time: 8   Follow up plan: Return if symptoms worsen or fail to improve.  Amy Fraise, MD [place]

## 2021-10-11 NOTE — Progress Notes (Signed)
Initial neurology clinic note  SERVICE DATE: 10/13/21 SERVICE TIME: 2:30 pm  Reason for Evaluation: Consultation requested by [name], MD for an opinion regarding weakness (patient wants to be seen for head pain). My final recommendations will be communicated back to the requesting physician by way of shared medical record or letter to requesting physician via [place] mail.  HPI: This is [name], a 81 y.o. right-handed female with a medical history of transverse myelitis, adenosquamous carcinoma of unknown primary (in leg 18 years ago s/p surgery and radiation), CAD, diverticulosis, right femur fracture (2017), GERD, HTN, HLD, vit D deficiency, cataract, and anxiety who presents to neurology clinic with the chief complaint of head pain. The patient is alone today.  Patient states she is here today for headache. The headache is located in the neck into the back of the head, front of head, and top of her head. It is a pulsating sensation. She states it is a tightness in the muscles of the neck that go into the head at the start. She has the pain twice per week. She thinks it takes about 1 minute to reach maximal intensity. It can be very intense though she has difficulty assigning a number to the intensity. On average it lasts about 1 minute and then resolves on its own. There is no associated face pain, but sometimes feels like it is around her eyes. She denies eye watering. She denies photophobia, phonophobia, nausea, or vomiting. Her eye doctor told her she had dry eyes and gave her drops. She denies jaw claudication but does have some tenderness in right temple area.  She also endorses dizziness. She is sitting in a chair and suddenly she felt like her head was spinning. The room was not spinning. It resolved in less than 1 minute. She denies quick head or body turns as she was sitting still. Over the last 6 months, she estimates she has had about 8 episodes that are all about the  same. There is no clear trigger and there is nothing that she has to do to make it stop.  She denies new medications.  Patient was seen by her PCP, Dr. Livia Snellen, at [place] on 07/11/21 for fatigue and weakness in the legs. Per patient, this is not a major concern that she would like to talk about. Per patient, she has difficulty with her right leg, but this is not anything new. She denies any recent falls. She walks with the assistance of a walker.  Patient was previously seen in this office by Dr. Tomi Likens in 2015. Per the office visit from 12/22/2013: On 02/05/12, she developed strange sensation in her right leg.  Over the next few days, she developed a progressive flaccid paralysis of the right leg and urinary retention.  She presented to [facility] on 02/10/12.  MRI of the thoracic spine with and without contrast (02/10/12) revealed non-expansile signal abnormality in the right hemicord at T11-T12 with associated patchy enhancement and sparing of the conus medullaris.  MRI of the brain without contrast (02/12/12) revealed chronic small vessel ischemic changes involving the pons and cerebral white matter.  MRI of the lumbar spine with contrast (02/10/12) revealed no acute abnormality.  MRI of the cervical spine without contrast (02/12/12) was unremarkable except for minor non-compressive disc bulges.  MRI of the pelvis with and without contrast (02/11/12) was unremarkable.  CBC and CMP were unremarkable.  ESR was 23.  She had refused LP.  She  was discharged to inpatient rehab.   She was evaluated by Dr. Krista Blue at [facility].  She underwent visual evoked potentials, which demonstrated prolonged latency bilaterally, left worse than right.  She underwent further laboratory testing, which revealed positive ANA and positive RNP.  Lupus anticoagulant, TSH, B12, B6, protein electrophoresis, Lyme and ACE were unremarkable.  Vitamin D was low at 18.6.     She was referred to [facility], where  she was evaluated by Dr. Diamantina Monks.  She underwent repeat MR imaging.  The thoracic and lumbar MRIs were read as unremarkable.  MRA was performed, which revealed no evidence of AV dural fistulas.  She underwent a lumbar puncture, which revealed normal CSF.   She underwent repeat MRI of the thoracic spine with and without contrast on 02/05/13, which revealed that the previous patchy hyperintensity right hemicord had normalized.  NMO antibodies were negative.   She experienced ongoing right leg pain at night, for which she was prescribed oxcarbazepine 336m.  This was subsequently changed to gabapentin 1040mat bedtime, which makes her groggy.  Cymbalta made her not feel well.  She was then started on Lyrica 7580mwice daily.  She also take tizanidine.   The patient denies symptoms suggestive of oculobulbar weakness including diplopia, ptosis, dysphagia, poor saliva control, dysarthria/dysphonia, impaired mastication, facial weakness/droop.  The patient does not report symptoms referable to autonomic dysfunction including impaired sweating, heat or cold intolerance, excessive mucosal dryness, gastroparetic early satiety, postprandial abdominal bloating, constipation, bowel or bladder dyscontrol, or syncope/presyncope/orthostatic intolerance.  There are no complaints relating to other symptoms of small fiber modalities including paresthesia/pain.  The patient has not noticed any recent skin rashes nor does she report any constitutional symptoms like fever, night sweats, anorexia or unintentional weight loss.  EtOH use: none  Restrictive diet? no Family history of neurologic disease? No   MEDICATIONS:  Outpatient Encounter Medications as of 10/13/2021  Medication Sig   ALLERGY RELIEF 10 MG tablet TAKE ONE TABLET BY MOUTH DAILY   ALPRAZolam (XANAX) 0.5 MG tablet Take 1 tablet (0.5 mg total) by mouth 3 (three) times daily.   Calcium Citrate-Vitamin D (CALCIUM CITRATE + D3) 315-250 MG-UNIT TABS Take  2 tablets by mouth daily.   cyclobenzaprine (FLEXERIL) 5 MG tablet TAKE ONE TABLET BY MOUTH THREE TIMES DAILY AS NEEDED FOR MUSCLE SPASMS.   hydrOXYzine (ATARAX) 10 MG tablet TAKE 1 TABLET TWICE DAILY.   Hyoscyamine Sulfate SL (LEVSIN/SL) 0.125 MG SUBL Place 0.125 mg under the tongue every 4 (four) hours as needed (abd cramping and diarrhea).   isosorbide mononitrate (IMDUR) 120 MG 24 hr tablet TAKE TWO (2) TABLETS BY MOUTH ONCE DAILY   lidocaine (XYLOCAINE) 5 % ointment Apply 1 application. topically as needed.   mometasone (ELOCON) 0.1 % cream Apply 1 Application topically daily. To affected eye brow area   nitroGLYCERIN (NITROSTAT) 0.4 MG SL tablet DISSOLVE ONE TABLET UNDER TONGUE EVERY 5 MINUTES UP TO 3 DOSES AS NEEDED FOR CHEST PAIN   traMADol (ULTRAM) 50 MG tablet TAKE ONE TABLET BY MOUTH EVERY TWELVE HOURS AS NEEDED.   triamterene-hydrochlorothiazide (DYAZIDE) 37.5-25 MG capsule TAKE ONE CAPSULE BY MOUTH 3 TIMES PER WEEK   amoxicillin-clavulanate (AUGMENTIN) 875-125 MG tablet Take 1 tablet by mouth 2 (two) times daily. Take all of this medication (Patient not taking: Reported on 10/13/2021)   fluticasone (FLONASE) 50 MCG/ACT nasal spray Place 2 sprays into both nostrils daily. (Patient not taking: Reported on 10/13/2021)   Iron-FA-B Cmp-C-Biot-Probiotic (FUSION PLUS) CAPS  Take 1 capsule by mouth daily. (Patient not taking: Reported on 10/13/2021)   meloxicam (MOBIC) 7.5 MG tablet Take 7.5 mg by mouth daily. (Patient not taking: Reported on 10/13/2021)   verapamil (CALAN-SR) 120 MG CR tablet Take 1 tablet (120 mg total) by mouth at bedtime. (Patient not taking: Reported on 10/13/2021)   No facility-administered encounter medications on file as of 10/13/2021.    PAST MEDICAL HISTORY: Past Medical History:  Diagnosis Date   Adenocarcinoma ([place])    LEFT LEG   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 minimal CAD by  cath in 2016 with presumed spasm (20% mLAD, 20% D2).   Cataract    bilateral cataracts removed   Coronary artery spasm ([facility])    Diverticulosis of colon (without mention of hemorrhage)    Femoral neck fracture, right, closed, initial encounter 10/04/2015   GERD (gastroesophageal reflux disease)    hepatic cyst    History of nuclear stress test 04/2011   lexiscan; normal study, no significant ischemia, low risk; 2015 - normal   HTN (hypertension)    PT. DENIES   Hyperlipidemia    Insomnia    Irritable bowel syndrome    Transverse myelitis (H[facility])    Vitamin D deficiency     PAST SURGICAL HISTORY: Past Surgical History:  Procedure Laterality Date   ABDOMINAL HYSTERECTOMY Bilateral    CARDIAC CATHETERIZATION     coronary spasm   CARDIAC CATHETERIZATION N/A 08/28/2014   Procedure: Left Heart Cath and Coronary Angiography;  Surgeon: Jettie Booze, MD;  Location: [facility];  Service: Cardiovascular;  Laterality: N/A;   COLONOSCOPY     DIAGNOSTIC LAPAROSCOPY     LYSIS OF ADHESION     PELVIC LAPAROSCOPY  1992   RSO, AND LSO ON 2007   RESECTION SOFT TISSUE TUMOR LEG / ANKLE RADICAL  2004   leg lesion resection & radiation   SALPINGOOPHORECTOMY Left    TOTAL HIP ARTHROPLASTY Right 10/04/2015   Procedure: TOTAL HIP ARTHROPLASTY ANTERIOR APPROACH;  Surgeon: Rod Can, MD;  Location: [facility];  Service: Orthopedics;  Laterality: Right;   TRANSTHORACIC ECHOCARDIOGRAM  07/2012   LV cavity size mildly reduced, normal wall motion; MV with calcified annulus and mild MR; LA mildly dilated; atrial septum with increased thickness - lipomatous hypertrophy; RV systolic pressure increased (borderline pulm HTN)   UPPER GASTROINTESTINAL ENDOSCOPY      ALLERGIES: Allergies  Allergen Reactions   Iodine Itching   Ivp Dye [Iodinated Contrast Media] Itching   Amlodipine Other (See Comments)    headaches   Lexapro [Escitalopram Oxalate] Other (See Comments)    Drunk, High feeling    Cymbalta [Duloxetine Hcl] Other (See Comments)    sleepiness   Gabapentin Other (See Comments)    sleepiness   Zanaflex [Tizanidine Hcl] Other (See Comments)    Very drunk feeling next day    FAMILY HISTORY: Family History  Problem Relation Age of Onset   Bladder Cancer Mother    Heart disease Mother    Hypertension Mother    Heart disease Father    Stroke Father    Hyperlipidemia Sister    Depression Sister    Diabetes Brother    Seizures Brother    Hypertension Brother    Diabetes Other        Multiple family members on both sides  Coronary artery disease Other        Multiple family members on both sides    Colon cancer Neg Hx    Esophageal cancer Neg Hx    Pancreatic cancer Neg Hx    Rectal cancer Neg Hx    Stomach cancer Neg Hx    Breast cancer Neg Hx     SOCIAL HISTORY: Social History   Tobacco Use   Smoking status: Former    Packs/day: 0.50    Years: 4.00    Total pack years: 2.00    Types: Cigarettes    Start date: 12/07/1968    Quit date: 11/20/1972    Years since quitting: 48.9   Smokeless tobacco: Never   Tobacco comments:    quit 40 years ago  Vaping Use   Vaping Use: Never used  Substance Use Topics   Alcohol use: No    Alcohol/week: 0.0 standard drinks of alcohol   Drug use: No   Social History   Social History Narrative   Disabled, lives with husband and daughter.  Enjoys shopping. Lives in two story home   1 son and 1 daughter.   1 grandson.   Caffeine 2 cans of soda and tea   Right handed     OBJECTIVE: PHYSICAL EXAM: BP 115/73   Pulse 83   Wt 156 lb (70.8 kg)   SpO2 98%   BMI 27.63 kg/m   General: General appearance: Awake and alert. No distress. Cooperative with exam.  Skin: No obvious rash or jaundice. HEENT: Atraumatic. Anicteric. Tenderness to palpation in right cervical paraspinal area, tender on back of head on the right. Patient feels more tightness in neck when turning to the right. Lungs: Non-labored breathing  on room air  Heart: Regular. No carotid bruits. Psych: Affect appropriate.  Neurological: Mental Status: Alert. Speech fluent. No pseudobulbar affect Cranial Nerves: CNII: No RAPD. Visual fields intact. CNIII, IV, VI: PERRL. No nystagmus. EOMI. CN V: Facial sensation intact bilaterally to fine touch. Tender to palpation of right temple. [place] clench strong.  CN VII: Facial muscles symmetric and strong. No ptosis at rest. CN VIII: Hears finger rub well bilaterally. CN IX: No hypophonia. CN X: Palate elevates symmetrically. CN XI: Full strength shoulder shrug bilaterally. CN XII: Tongue protrusion full and midline. No atrophy or fasciculations. No significant dysarthria Motor: Tone is normal. No fasciculations in extremities. No atrophy.  Individual muscle group testing (MRC grade out of 5):  Movement     Neck flexion 5-  Limited by pain  Neck extension 5     Right Left   Shoulder abduction 5 5   Elbow flexion 5 5   Elbow extension 5 5   Finger abduction - FDI 5 5   Finger abduction - ADM 5 5   Finger extension 5 5   Finger distal flexion - 2/_0 Finger distal flexion - 4/_1 Thumb flexion - FPL 5 5   Thumb abduction - APB 5 5    Hip flexion 2 5   Hip extension 5- 5   Hip adduction 5 5   Hip abduction 4+ 5   Knee extension 5- 5   Knee flexion 4 5   Dorsiflexion 4 5   Plantarflexion 4+ 5     Reflexes:  Right Left   Bicep 2+ 2+   Tricep 2+ 2+   BrRad 2+ 2+   Knee 3+ 2+ Cross adductors bilaterally  Ankle 2+ 1+  Pathological Reflexes: Babinski: extensor response on right, flexor on left Hoffman: absent bilaterally Troemner: absent bilaterally Sensation: Intact to pinprick in all extremities Coordination: Intact finger-to- nose-finger bilaterally. Romberg negative. Gait: Able to rise from chair with arms crossed unassisted. Narrow-based gait. Right leg lags behind with some steppage on that side.  Lab and Test Review: Internal labs: Normal or  unremarkable: lipid panel, CMP, CBC, ferritin, TSH B12 (09/27/16): 354  MRI lumbar spine (05/29/21): FINDINGS: Segmentation:  Standard.   Alignment:  Trace degenerative anterolisthesis at L4-L5.   Vertebrae: No fracture, evidence of discitis, or aggressive bone lesion.   Conus medullaris and cauda equina: Conus extends to the L1 level. Conus and cauda equina appear normal.   Paraspinal and other soft tissues: Multiple right renal cysts.   Disc levels:   T11-T12: Asymmetric right disc bulging.  No significant stenosis.   T12-L1: Asymmetric right disc bulging.  No significant stenosis.   L1-L2: Minimal foraminal disc bulging. Mild bilateral facet arthropathy. No significant stenosis.   L2-L3: Right greater than left disc bulging, ligamentum flavum hypertrophy and mild facet arthropathy. There is mild spinal canal stenosis, moderate left and mild right neural foraminal stenosis. Contact with the exiting left L2 nerve root.   L3-L4: Broad-based disc bulging, ligamentum flavum hypertrophy mild facet arthropathy results and mild spinal canal stenosis, mild to moderate left and mild right neural foraminal stenosis. Contact with the exiting left L3 nerve root.   L4-L5: Trace anterolisthesis with minimal disc bulging, ligamentum flavum hypertrophy and bilateral facet arthropathy results in mild spinal canal stenosis, mild to moderate left and right neural foraminal stenosis. Contact with the exiting left L4 nerve root. Small right-sided perineural cyst.   L5-S1: No significant stenosis.  Bilateral perineural cysts at S1.   IMPRESSION: Multilevel degenerative changes of the lumbar spine, with mild spinal canal stenosis at L2-L3, L3-L4, and L4-L5.   Mild to moderate left-sided neural foraminal stenosis at L2-L3, L3-L4, and L4-L5, with contact with the exiting left sided nerve roots at these levels, which could potentially result in left-sided radiculopathy.   Mild right-sided  neural foraminal stenosis at L2-L3 and L3-L4 and mild-to-moderate right-sided neural foraminal stenosis at L4-L5.  MRI brain (08/16/2016): FINDINGS: Brain: Diffusion-weighted images demonstrate no acute or subacute infarction. Extensive periventricular and subcortical T2 changes bilaterally demonstrates similar progression. Dilated perivascular spaces are present within the basal ganglia. Extensive white matter changes continue into the brainstem. Cerebellum is unremarkable. Internal auditory canals are within normal limits bilaterally.   Vascular: Flow is present in the major intracranial arteries.   Skull and upper cervical spine: The skullbase is within normal limits. The craniocervical junction is normal. Midline sagittal structures are unremarkable.   Sinuses/Orbits: A single right ethmoid air cell is opacified. The paranasal sinuses and mastoid air cells are otherwise clear. Bilateral lens replacements are present.   IMPRESSION: 1. Progressive diffuse white matter disease. This likely reflects the sequela of chronic microvascular ischemia, hypertension, or vasculitis. 2. No acute abnormality.  MRI cervical and thoracic spine (09/03/2015): FINDINGS: MRI CERVICAL SPINE FINDINGS Alignment: Physiologic. Vertebrae: No fracture, evidence of discitis, or bone lesion. Cord: Normal signal and morphology. Posterior Fossa, vertebral arteries, paraspinal tissues: Negative. Disc levels: Discs: Disc desiccation throughout the cervical spine. Mild disc height loss at C5-6. C2-3: No significant disc bulge. No neural foraminal stenosis. No central canal stenosis. C3-4: Mild broad-based disc bulge. No neural foraminal stenosis. No central canal stenosis. C4-5: Mild broad-based disc bulge. No neural foraminal stenosis. No central canal stenosis.  C5-6: Mild broad-based disc bulge. No neural foraminal stenosis. No central canal stenosis. C6-7: No significant disc bulge. No neural  foraminal stenosis. No central canal stenosis. C7-T1: No significant disc bulge. No neural foraminal stenosis. No central canal stenosis. MRI THORACIC SPINE FINDINGS Segmentation:  Standard. Alignment:  Physiologic. Vertebrae:  No fracture, evidence of discitis, or bone lesion. Paraspinal and other soft tissues: Negative. Disc levels: Disc spaces:  Disc spaces are maintained. T1-T2: No disc protrusion, foraminal stenosis or central canal stenosis. T2-T3: No disc protrusion, foraminal stenosis or central canal stenosis. T3-T4: No disc protrusion, foraminal stenosis or central canal stenosis. T4-T5: No disc protrusion, foraminal stenosis or central canal stenosis. T5-T6: No disc protrusion, foraminal stenosis or central canal stenosis. T6-T7: No disc protrusion, foraminal stenosis or central canal stenosis. T7-T8: No disc protrusion, foraminal stenosis or central canal stenosis. T8-T9: No disc protrusion, foraminal stenosis or central canal stenosis. T9-T10: No disc protrusion, foraminal stenosis or central canal stenosis. T10-T11: No disc protrusion, foraminal stenosis or central canal stenosis. T11-T12: No disc protrusion, foraminal stenosis or central canal stenosis. IMPRESSION: CERVICAL SPINE 1. No significant disc protrusion, foraminal stenosis or central canal stenosis of the cervical spine. No acute injury of the cervical spine. Normal cervical spinal cord. THORACIC SPINE 1. No significant disc protrusion, foraminal stenosis or central canal stenosis of the thoracic spine. No acute injury of the thoracic spine. Normal thoracic spinal cord.  EMG (12/24/13):    ASSESSMENT: Amy Black is a 81 y.o. female who presents for evaluation of abrupt onset, intermittent neck and head pain and dizziness. She has a relevant medical history of transverse myelitis, adenosquamous carcinoma of unknown primary (in leg 18 years ago s/p surgery and radiation), CAD,  diverticulosis, right femur fracture (2017), GERD, HTN, HLD, vit D deficiency, cataract, and anxiety. Her neurological examination is pertinent for tenderness to palpation in right neck, head, and temple. She also has chronic weakness of the right lower extremity.   Patient's symptoms are most consistent with cervicalgia causing headaches and intermittent dizziness. Her description of the headaches do have a few red flags though. First, she is not typically a headachy person, so new onset headaches at this age is a red flag. She also states that the headaches and dizziness have abrupt intense onset with maximal symptoms in < 1 minute. Finally, she has temporal pain, but without jaw claudication or vision changes. While her headaches do not last long and self resolve, I would like to ensure there is no more sinister etiology given these red flags.  Patient was referred for weakness and fatigue by Dr. Livia Snellen, but patient now states this is not a major issue and that her weakness is chronic. She declined further work up for these issues today.  PLAN: -Blood work: ESR, CRP -Cervical xray -MRI/MRA brain w/wo contrast -Physical therapy for neck pain and dizziness  -Return to clinic in 3 months  The impression above as well as the plan as outlined below were extensively discussed with the patient who voiced understanding. All questions were answered to their satisfaction.  The patient was counseled on pertinent fall precautions per the printed material provided today, and as noted under the "Patient Instructions" section below.  When available, results of the above investigations and possible further recommendations will be communicated to the patient via telephone/MyChart. Patient to call office if not contacted after expected testing turnaround time.   Total time spent reviewing records, interview, history/exam, documentation, and coordination of care on day of encounter:  65 min   Thank you for  allowing me to participate in patient's care.  If I can answer any additional questions, I would be pleased to do so.  Kai Levins, MD   CC: [name], MD [place] [id number]  CC: Referring provider: [name], MD [address] [place],  [place] [address]

## 2021-10-13 ENCOUNTER — Other Ambulatory Visit (INDEPENDENT_AMBULATORY_CARE_PROVIDER_SITE_OTHER): Payer: Medicare Other

## 2021-10-13 ENCOUNTER — Ambulatory Visit: Payer: Medicare Other | Admitting: Neurology

## 2021-10-13 ENCOUNTER — Encounter: Payer: Self-pay | Admitting: Neurology

## 2021-10-13 VITALS — BP 115/73 | HR 83 | Wt 156.0 lb

## 2021-10-13 DIAGNOSIS — R29898 Other symptoms and signs involving the musculoskeletal system: Secondary | ICD-10-CM

## 2021-10-13 DIAGNOSIS — R42 Dizziness and giddiness: Secondary | ICD-10-CM

## 2021-10-13 DIAGNOSIS — R519 Headache, unspecified: Secondary | ICD-10-CM

## 2021-10-13 DIAGNOSIS — M542 Cervicalgia: Secondary | ICD-10-CM | POA: Diagnosis not present

## 2021-10-13 DIAGNOSIS — G373 Acute transverse myelitis in demyelinating disease of central nervous system: Secondary | ICD-10-CM | POA: Diagnosis not present

## 2021-10-13 NOTE — Patient Instructions (Signed)
I saw you today for head and neck pain and dizziness. I think your problems are coming from tightness in your neck. This can cause headaches and dizziness.  I would like to look into things more with the following: -lab work today -Xray of your neck -MRI of your brain and blood vessels in your brain (MRA)  I am referring you to physical therapy for neck pain and dizziness. They will likely give you home exercises that you should do to help with your symptoms.  I will be in touch with you when I have the results of your testing.  I would like to see you back in clinic in 3 months or sooner if needed.  The physicians and staff at [facility] are committed to providing excellent care. You may receive a survey requesting feedback about your experience at our office. We strive to receive "very good" responses to the survey questions. If you feel that your experience would prevent you from giving the office a "very good " response, please contact our office to try to remedy the situation. We may be reached at [phone]. Thank you for taking the time out of your busy day to complete the survey.  Kai Levins, MD [facility]  Preventing Falls at [facility] are common, often dreaded events in the lives of older people. Aside from the obvious injuries and even death that may result, fall can cause wide-ranging consequences including loss of independence, mental decline, decreased activity and mobility. Younger people are also at risk of falling, especially those with chronic illnesses and fatigue.  Ways to reduce risk for falling Examine diet and medications. Warm foods and alcohol dilate blood vessels, which can lead to dizziness when standing. Sleep aids, antidepressants and pain medications can also increase the likelihood of a fall.  Get a vision exam. Poor vision, cataracts and glaucoma increase the chances of falling.  Check foot gear. Shoes should fit snugly and have a sturdy,  nonskid sole and a broad, low heel  Participate in a physician-approved exercise program to build and maintain muscle strength and improve balance and coordination. Programs that use ankle weights or stretch bands are excellent for muscle-strengthening. Water aerobics programs and low-impact Tai Chi programs have also been shown to improve balance and coordination.  Increase vitamin D intake. Vitamin D improves muscle strength and increases the amount of calcium the body is able to absorb and deposit in bones.  How to prevent falls from common hazards Floors - Remove all loose wires, cords, and throw rugs. Minimize clutter. Make sure rugs are anchored and smooth. Keep furniture in its usual place.  Chairs -- Use chairs with straight backs, armrests and firm seats. Add firm cushions to existing pieces to add height.  Bathroom - Install grab bars and non-skid tape in the tub or shower. Use a bathtub transfer bench or a shower chair with a back support Use an elevated toilet seat and/or safety rails to assist standing from a low surface. Do not use towel racks or bathroom tissue holders to help you stand.  Lighting - Make sure halls, stairways, and entrances are well-lit. Install a night light in your bathroom or hallway. Make sure there is a light switch at the top and bottom of the staircase. Turn lights on if you get up in the middle of the night. Make sure lamps or light switches are within reach of the bed if you have to get up during the night.  Kitchen -  Install non-skid rubber mats near the sink and stove. Clean spills immediately. Store frequently used utensils, pots, pans between waist and eye level. This helps prevent reaching and bending. Sit when getting things out of lower cupboards.  Living room/ Bedrooms - Place furniture with wide spaces in between, giving enough room to move around. Establish a route through the living room that gives you something to hold onto as you walk.  Stairs  - Make sure treads, rails, and rugs are secure. Install a rail on both sides of the stairs. If stairs are a threat, it might be helpful to arrange most of your activities on the lower level to reduce the number of times you must climb the stairs.  Entrances and doorways - Install metal handles on the walls adjacent to the doorknobs of all doors to make it more secure as you travel through the doorway.  Tips for maintaining balance Keep at least one hand free at all times. Try using a backpack or fanny pack to hold things rather than carrying them in your hands. Never carry objects in both hands when walking as this interferes with keeping your balance.  Attempt to swing both arms from front to back while walking. This might require a conscious effort if Parkinson's disease has diminished your movement. It will, however, help you to maintain balance and posture, and reduce fatigue.  Consciously lift your feet off of the ground when walking. Shuffling and dragging of the feet is a common culprit in losing your balance.  When trying to navigate turns, use a "U" technique of facing forward and making a wide turn, rather than pivoting sharply.  Try to stand with your feet shoulder-length apart. When your feet are close together for any length of time, you increase your risk of losing your balance and falling.  Do one thing at a time. Don't try to walk and accomplish another task, such as reading or looking around. The decrease in your automatic reflexes complicates motor function, so the less distraction, the better.  Do not wear rubber or gripping soled shoes, they might "catch" on the floor and cause tripping.  Move slowly when changing positions. Use deliberate, concentrated movements and, if needed, use a grab bar or walking aid. Count 15 seconds between each movement. For example, when rising from a seated position, wait 15 seconds after standing to begin walking.  If balance is a continuous  problem, you might want to consider a walking aid such as a cane, walking stick, or walker. Once you've mastered walking with help, you might be ready to try it on your own again.

## 2021-10-14 LAB — C-REACTIVE PROTEIN: CRP: <1 mg/dL (ref 0.5–20.0)

## 2021-10-14 LAB — SEDIMENTATION RATE: Sed Rate: 28 mm/hr (ref 0–30)

## 2021-10-20 ENCOUNTER — Encounter: Payer: Self-pay | Admitting: Physical Therapy

## 2021-10-20 ENCOUNTER — Ambulatory Visit: Payer: Medicare Other | Attending: Neurology | Admitting: Physical Therapy

## 2021-10-20 ENCOUNTER — Other Ambulatory Visit: Payer: Self-pay

## 2021-10-20 DIAGNOSIS — R519 Headache, unspecified: Secondary | ICD-10-CM | POA: Insufficient documentation

## 2021-10-20 DIAGNOSIS — R29898 Other symptoms and signs involving the musculoskeletal system: Secondary | ICD-10-CM | POA: Insufficient documentation

## 2021-10-20 DIAGNOSIS — R42 Dizziness and giddiness: Secondary | ICD-10-CM | POA: Diagnosis not present

## 2021-10-20 DIAGNOSIS — R293 Abnormal posture: Secondary | ICD-10-CM | POA: Insufficient documentation

## 2021-10-20 DIAGNOSIS — G373 Acute transverse myelitis in demyelinating disease of central nervous system: Secondary | ICD-10-CM | POA: Insufficient documentation

## 2021-10-20 DIAGNOSIS — M542 Cervicalgia: Secondary | ICD-10-CM | POA: Diagnosis not present

## 2021-10-20 DIAGNOSIS — M62838 Other muscle spasm: Secondary | ICD-10-CM | POA: Insufficient documentation

## 2021-10-20 NOTE — Therapy (Signed)
OUTPATIENT PHYSICAL THERAPY CERVICAL EVALUATION   Patient Name: Franklin Clapsaddle Kiesel MRN: [id number] DOB:1941-01-07, 81 y.o., female Today's Date: 10/20/2021   PT End of Session - 10/20/21 1559     Visit Number 1    Number of Visits 12    Date for PT Re-Evaluation 12/01/21    Authorization Type FOTO AT LEAST EVERY 5TH VISIT.  PROGRESS NOTE AT 10TH VISIT.  KX MODIFIER AFTER 15 VISITS.    PT Start Time 0234    PT Stop Time 0320    PT Time Calculation (min) 46 min    Activity Tolerance Patient tolerated treatment well    Behavior During Therapy WFL for tasks assessed/performed             Past Medical History:  Diagnosis Date   Adenocarcinoma ([place])    LEFT LEG   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 minimal CAD by cath in 2016 with presumed spasm (20% mLAD, 20% D2).   Cataract    bilateral cataracts removed   Coronary artery spasm ([facility])    Diverticulosis of colon (without mention of hemorrhage)    Femoral neck fracture, right, closed, initial encounter 10/04/2015   GERD (gastroesophageal reflux disease)    hepatic cyst    History of nuclear stress test 04/2011   lexiscan; normal study, no significant ischemia, low risk; 2015 - normal   HTN (hypertension)    PT. DENIES   Hyperlipidemia    Insomnia    Irritable bowel syndrome    Transverse myelitis ([place])    Vitamin D deficiency    Past Surgical History:  Procedure Laterality Date   ABDOMINAL HYSTERECTOMY Bilateral    CARDIAC CATHETERIZATION     coronary spasm   CARDIAC CATHETERIZATION N/A 08/28/2014   Procedure: Left Heart Cath and Coronary Angiography;  Surgeon: Jettie Booze, MD;  Location: [place] [facility];  Service: Cardiovascular;  Laterality: N/A;   COLONOSCOPY     DIAGNOSTIC LAPAROSCOPY     LYSIS OF ADHESION     PELVIC LAPAROSCOPY  1992   RSO, AND LSO ON 2007   RESECTION SOFT TISSUE TUMOR LEG / ANKLE RADICAL  2004    leg lesion resection & radiation   SALPINGOOPHORECTOMY Left    TOTAL HIP ARTHROPLASTY Right 10/04/2015   Procedure: TOTAL HIP ARTHROPLASTY ANTERIOR APPROACH;  Surgeon: Rod Can, MD;  Location: [place];  Service: Orthopedics;  Laterality: Right;   TRANSTHORACIC ECHOCARDIOGRAM  07/2012   LV cavity size mildly reduced, normal wall motion; MV with calcified annulus and mild MR; LA mildly dilated; atrial septum with increased thickness - lipomatous hypertrophy; RV systolic pressure increased (borderline pulm HTN)   UPPER GASTROINTESTINAL ENDOSCOPY     Patient Active Problem List   Diagnosis Date Noted   Fecal smearing 02/14/2021   Weakness 01/03/2021   Subacute frontal sinusitis 08/19/2020   Other headache syndrome 07/03/2020   Herpes zoster dermatitis 02/16/2020   IDA (iron deficiency anemia) 05/07/2019   Palpitations 11/14/2018   Thrombocytosis 07/23/2015   Baker's cyst of knee 02/14/2013   Coronary artery spasm ([place]) 11/19/2012   Transverse myelitis ([place]) 10/10/2012   HTN (hypertension) 08/29/2012   Arthritis of neck 08/29/2012   Urinary retention 02/20/2012   Lower extremity weakness 02/20/2012   Adenocarcinoma of unknown primary ([place]) 02/06/2011   Anxiety 05/19/2010   Coronary atherosclerosis 12/03/2009   GERD  12/03/2009    REFERRING PROVIDER: Kai Levins MD  REFERRING DIAG: Bilateral posterior neck pain  THERAPY DIAG:  Cervicalgia - Plan: PT plan of care cert/re-cert  Abnormal posture - Plan: PT plan of care cert/re-cert  Other muscle spasm - Plan: PT plan of care cert/re-cert  Rationale for Evaluation and Treatment Rehabilitation  ONSET DATE: Ongoing but especially over last three months.  SUBJECTIVE:                                                                                                                                                                                                         SUBJECTIVE STATEMENT: The patient presents to the clinic today  with c/o bilateral neck pain and headaches.  She states this has been an ongoing problem but especially over the last three months.  When she has pain she rates it at a 7-8/10.  Her pain is relatively low today.  She states her headaches come and go.  She does report anything really triggers the headaches.  Her neck pain can increase with prolonged sitting.  She knows she needs to be more aware of her posture  PERTINENT HISTORY:  HTN, h/o dizziness, right THA, CAD.  PAIN:  Are you having pain? As above.   WEIGHT BEARING RESTRICTIONS No  FALLS:  Has patient fallen in last 6 months? No  LIVING ENVIRONMENT: Lives with: lives with their spouse Lives in: House/apartment Has following equipment at home: None    PLOF: Independent with basic ADLs  PATIENT GOALS Not have headaches and neck pain  OBJECTIVE:   DIAGNOSTIC FINDINGS:  MRI: CERVICAL SPINE 1. No significant disc protrusion, foraminal stenosis or central canal stenosis of the cervical spine. No acute injury of the cervical spine. Normal cervical spinal cord.  PATIENT SURVEYS:  FOTO Complete.  POSTURE: rounded shoulders and forward head  PALPATION: Diffuse tenderness and tautness over patient's bilateral cervical paraspinal musculature, UT's and c/o pain over suboccipital region bilaterally.   CERVICAL ROM:   Active ROM A/PROM (deg) eval  Flexion   Extension   Right lateral flexion 20  Left lateral flexion 10  Right rotation 53  Left rotation 50    UPPER EXTREMITY MMT:  Normal bilateral UE strength.  DTR's:  Normal bilateral UE DTR's.  TODAY'S TREATMENT:  HMP and IFC at 80-150 Hz on 40% scan x 20 minutes to patient's bilateral cervical musculature.  Patient enjoyed treatment with normal modality response following removal of modality.  HOME EXERCISE PROGRAM: Chin tucks.  ASSESSMENT:  CLINICAL IMPRESSION: The patient presents to OPPT with  c/o neck pain and occasional headaches.  She was found to have  a loss of cervical range of motion.  She was also diffusely tender over her cervical musculature bilaterally, especially her UT's.  Her UE DTR's are.  Her UE strength is normal.  She states her headaches come and go and happen for no apparent reason.  Patient will benefit from skilled physical therapy intervention to address pain and deficits.  OBJECTIVE IMPAIRMENTS decreased activity tolerance, decreased ROM, increased muscle spasms, postural dysfunction, and pain.   ACTIVITY LIMITATIONS sitting  REHAB POTENTIAL: Good  CLINICAL DECISION MAKING: Stable/uncomplicated  EVALUATION COMPLEXITY: Low   GOALS: Goals reviewed with patient? Yes  SHORT TERM GOALS: Target date: 11/03/2021   Ind with a HEP. Goal status: INITIAL     LONG TERM GOALS: Target date: 12/01/2021  1.  Increase active cervical rotation to 65-70 degrees+ so patient can turn head more easily while driving. Goal status: INITIAL  2.  Decrease headaches to no more than once per week with a decrease intensity of at least 50 percent. Goal status: INITIAL PLAN: PT FREQUENCY: 2x/week  PT DURATION: 6 weeks  PLANNED INTERVENTIONS: Therapeutic exercises, Therapeutic activity, Patient/Family education, Dry Needling, Electrical stimulation, Cryotherapy, Moist heat, Ultrasound, and Manual therapy  PLAN FOR NEXT SESSION: Combo e'stim/US, STW/M, postural improvement exercises.   Love Milbourne, [place], PT 10/20/2021, 4:47 PM

## 2021-10-25 ENCOUNTER — Encounter: Payer: Medicare Other | Admitting: Physical Therapy

## 2021-10-27 ENCOUNTER — Encounter: Payer: Self-pay | Admitting: Physical Therapy

## 2021-10-27 ENCOUNTER — Ambulatory Visit: Payer: Medicare Other | Admitting: Physical Therapy

## 2021-10-27 DIAGNOSIS — M542 Cervicalgia: Secondary | ICD-10-CM

## 2021-10-27 DIAGNOSIS — R293 Abnormal posture: Secondary | ICD-10-CM

## 2021-10-27 DIAGNOSIS — G373 Acute transverse myelitis in demyelinating disease of central nervous system: Secondary | ICD-10-CM | POA: Diagnosis not present

## 2021-10-27 DIAGNOSIS — R519 Headache, unspecified: Secondary | ICD-10-CM | POA: Diagnosis not present

## 2021-10-27 DIAGNOSIS — M62838 Other muscle spasm: Secondary | ICD-10-CM

## 2021-10-27 DIAGNOSIS — R42 Dizziness and giddiness: Secondary | ICD-10-CM | POA: Diagnosis not present

## 2021-10-27 DIAGNOSIS — R29898 Other symptoms and signs involving the musculoskeletal system: Secondary | ICD-10-CM | POA: Diagnosis not present

## 2021-10-27 NOTE — Therapy (Signed)
OUTPATIENT PHYSICAL THERAPY CERVICAL EVALUATION   Patient Name: Amy Black MRN: [id number] DOB:1940-07-06, 81 y.o., female Today's Date: 10/27/2021   PT End of Session - 10/27/21 1433     Visit Number 2    Number of Visits 12    Date for PT Re-Evaluation 12/01/21    Authorization Type FOTO AT LEAST EVERY 5TH VISIT.  PROGRESS NOTE AT 10TH VISIT.  KX MODIFIER AFTER 15 VISITS.    PT Start Time 5329    PT Stop Time 1515    PT Time Calculation (min) 41 min    Activity Tolerance Patient tolerated treatment well    Behavior During Therapy WFL for tasks assessed/performed              Past Medical History:  Diagnosis Date   Adenocarcinoma ([place])    LEFT LEG   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 minimal CAD by cath in 2016 with presumed spasm (20% mLAD, 20% D2).   Cataract    bilateral cataracts removed   Coronary artery spasm ([facility])    Diverticulosis of colon (without mention of hemorrhage)    Femoral neck fracture, right, closed, initial encounter 10/04/2015   GERD (gastroesophageal reflux disease)    hepatic cyst    History of nuclear stress test 04/2011   lexiscan; normal study, no significant ischemia, low risk; 2015 - normal   HTN (hypertension)    PT. DENIES   Hyperlipidemia    Insomnia    Irritable bowel syndrome    Transverse myelitis ([place])    Vitamin D deficiency    Past Surgical History:  Procedure Laterality Date   ABDOMINAL HYSTERECTOMY Bilateral    CARDIAC CATHETERIZATION     coronary spasm   CARDIAC CATHETERIZATION N/A 08/28/2014   Procedure: Left Heart Cath and Coronary Angiography;  Surgeon: Jettie Booze, MD;  Location: [place] [facility];  Service: Cardiovascular;  Laterality: N/A;   COLONOSCOPY     DIAGNOSTIC LAPAROSCOPY     LYSIS OF ADHESION     PELVIC LAPAROSCOPY  1992   RSO, AND LSO ON 2007   RESECTION SOFT TISSUE TUMOR LEG / ANKLE RADICAL   2004   leg lesion resection & radiation   SALPINGOOPHORECTOMY Left    TOTAL HIP ARTHROPLASTY Right 10/04/2015   Procedure: TOTAL HIP ARTHROPLASTY ANTERIOR APPROACH;  Surgeon: Rod Can, MD;  Location: [place];  Service: Orthopedics;  Laterality: Right;   TRANSTHORACIC ECHOCARDIOGRAM  07/2012   LV cavity size mildly reduced, normal wall motion; MV with calcified annulus and mild MR; LA mildly dilated; atrial septum with increased thickness - lipomatous hypertrophy; RV systolic pressure increased (borderline pulm HTN)   UPPER GASTROINTESTINAL ENDOSCOPY     Patient Active Problem List   Diagnosis Date Noted   Fecal smearing 02/14/2021   Weakness 01/03/2021   Subacute frontal sinusitis 08/19/2020   Other headache syndrome 07/03/2020   Herpes zoster dermatitis 02/16/2020   IDA (iron deficiency anemia) 05/07/2019   Palpitations 11/14/2018   Thrombocytosis 07/23/2015   Baker's cyst of knee 02/14/2013   Coronary artery spasm ([place]) 11/19/2012   Transverse myelitis ([place]) 10/10/2012   HTN (hypertension) 08/29/2012   Arthritis of neck 08/29/2012   Urinary retention 02/20/2012   Lower extremity weakness 02/20/2012   Adenocarcinoma of unknown primary ([place]) 02/06/2011   Anxiety 05/19/2010   Coronary atherosclerosis 12/03/2009  GERD 12/03/2009    REFERRING PROVIDER: Kai Levins MD  REFERRING DIAG: Bilateral posterior neck pain  THERAPY DIAG:  Cervicalgia  Abnormal posture  Other muscle spasm  Rationale for Evaluation and Treatment Rehabilitation  ONSET DATE: Ongoing but especially over last three months.  SUBJECTIVE:                                                                                                                                                                                                         SUBJECTIVE STATEMENT: Reports stiffness across the top of her shoulders and neck. Had some pain the other night when lifting her head but was very quick  pain.  PERTINENT HISTORY:  HTN, h/o dizziness, right THA, CAD.  PAIN:  Are you having pain? No just stiffness.   WEIGHT BEARING RESTRICTIONS No  FALLS:  Has patient fallen in last 6 months? No  LIVING ENVIRONMENT: Lives with: lives with their spouse Lives in: House/apartment Has following equipment at home: None    PLOF: Independent with basic ADLs  PATIENT GOALS Not have headaches and neck pain  OBJECTIVE:   DIAGNOSTIC FINDINGS:  MRI: CERVICAL SPINE 1. No significant disc protrusion, foraminal stenosis or central canal stenosis of the cervical spine. No acute injury of the cervical spine. Normal cervical spinal cord.  PATIENT SURVEYS:  FOTO Complete.  POSTURE: rounded shoulders and forward head  PALPATION: Diffuse tenderness and tautness over patient's bilateral cervical paraspinal musculature, UT's and c/o pain over suboccipital region bilaterally.   CERVICAL ROM:   Active ROM A/PROM (deg) eval  Flexion   Extension   Right lateral flexion 20  Left lateral flexion 10  Right rotation 53  Left rotation 50    UPPER EXTREMITY MMT:  Normal bilateral UE strength.  DTR's:  Normal bilateral UE DTR's.  TODAY'S TREATMENT:  Modalities  Date: 10/27/2021 Unattended Estim: Cervical, Pre-Mod, 15 mins, Tone Combo: Cervical, 1.5 w/cm2, 100%, 10 mins, Tone  Manual Therapy Soft Tissue Mobilization: B UTs, cervical paraspinals and scalenes, STW to B UT and cervical paraspinals to reduce stiffness and tone    HOME EXERCISE PROGRAM: Chin tucks.  ASSESSMENT:  CLINICAL IMPRESSION: Patient presented in clinic with reports of neck pain across the top of her posterior shoulders as well. Patient did present with increased tightness of B UTs into cervical paraspinals and scalenes. Pillows were discussed today for posture and patient was provided a handout for TENS unit. Normal modalities response noted following removal of the modalities.  OBJECTIVE IMPAIRMENTS  decreased activity tolerance, decreased ROM, increased muscle spasms, postural dysfunction,  and pain.   ACTIVITY LIMITATIONS sitting  REHAB POTENTIAL: Good  CLINICAL DECISION MAKING: Stable/uncomplicated  EVALUATION COMPLEXITY: Low   GOALS: Goals reviewed with patient? Yes  SHORT TERM GOALS: Target date: 11/10/2021   Ind with a HEP. Goal status: INITIAL     LONG TERM GOALS: Target date: 12/08/2021  1.  Increase active cervical rotation to 65-70 degrees+ so patient can turn head more easily while driving. Goal status: INITIAL  2.  Decrease headaches to no more than once per week with a decrease intensity of at least 50 percent. Goal status: INITIAL PLAN: PT FREQUENCY: 2x/week  PT DURATION: 6 weeks  PLANNED INTERVENTIONS: Therapeutic exercises, Therapeutic activity, Patient/Family education, Dry Needling, Electrical stimulation, Cryotherapy, Moist heat, Ultrasound, and Manual therapy  PLAN FOR NEXT SESSION: Combo e'stim/US, STW/M, postural improvement exercises.   Standley Brooking, PTA 10/27/2021, 5:14 PM

## 2021-10-31 ENCOUNTER — Encounter: Payer: Self-pay | Admitting: Physical Therapy

## 2021-10-31 ENCOUNTER — Ambulatory Visit: Payer: Medicare Other | Admitting: Physical Therapy

## 2021-10-31 DIAGNOSIS — M542 Cervicalgia: Secondary | ICD-10-CM | POA: Diagnosis not present

## 2021-10-31 DIAGNOSIS — G373 Acute transverse myelitis in demyelinating disease of central nervous system: Secondary | ICD-10-CM | POA: Diagnosis not present

## 2021-10-31 DIAGNOSIS — M62838 Other muscle spasm: Secondary | ICD-10-CM

## 2021-10-31 DIAGNOSIS — R42 Dizziness and giddiness: Secondary | ICD-10-CM | POA: Diagnosis not present

## 2021-10-31 DIAGNOSIS — R293 Abnormal posture: Secondary | ICD-10-CM | POA: Diagnosis not present

## 2021-10-31 DIAGNOSIS — R519 Headache, unspecified: Secondary | ICD-10-CM | POA: Diagnosis not present

## 2021-10-31 DIAGNOSIS — R29898 Other symptoms and signs involving the musculoskeletal system: Secondary | ICD-10-CM | POA: Diagnosis not present

## 2021-10-31 NOTE — Therapy (Signed)
OUTPATIENT PHYSICAL THERAPY CERVICAL TREATMENT   Patient Name: Amy Black MRN: [id number] DOB:01-11-1941, 81 y.o., female Today's Date: 10/31/2021   PT End of Session - 10/31/21 1346     Visit Number 3    Number of Visits 12    Date for PT Re-Evaluation 12/01/21    Authorization Type FOTO AT LEAST EVERY 5TH VISIT.  PROGRESS NOTE AT 10TH VISIT.  KX MODIFIER AFTER 15 VISITS.    PT Start Time 1347    PT Stop Time 1429    PT Time Calculation (min) 42 min    Activity Tolerance Patient tolerated treatment well    Behavior During Therapy WFL for tasks assessed/performed              Past Medical History:  Diagnosis Date   Adenocarcinoma ([place])    LEFT LEG   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 minimal CAD by cath in 2016 with presumed spasm (20% mLAD, 20% D2).   Cataract    bilateral cataracts removed   Coronary artery spasm ([facility])    Diverticulosis of colon (without mention of hemorrhage)    Femoral neck fracture, right, closed, initial encounter 10/04/2015   GERD (gastroesophageal reflux disease)    hepatic cyst    History of nuclear stress test 04/2011   lexiscan; normal study, no significant ischemia, low risk; 2015 - normal   HTN (hypertension)    PT. DENIES   Hyperlipidemia    Insomnia    Irritable bowel syndrome    Transverse myelitis ([place])    Vitamin D deficiency    Past Surgical History:  Procedure Laterality Date   ABDOMINAL HYSTERECTOMY Bilateral    CARDIAC CATHETERIZATION     coronary spasm   CARDIAC CATHETERIZATION N/A 08/28/2014   Procedure: Left Heart Cath and Coronary Angiography;  Surgeon: Jettie Booze, MD;  Location: [facility];  Service: Cardiovascular;  Laterality: N/A;   COLONOSCOPY     DIAGNOSTIC LAPAROSCOPY     LYSIS OF ADHESION     PELVIC LAPAROSCOPY  1992   RSO, AND LSO ON 2007   RESECTION SOFT TISSUE TUMOR LEG / ANKLE RADICAL  2004    leg lesion resection & radiation   SALPINGOOPHORECTOMY Left    TOTAL HIP ARTHROPLASTY Right 10/04/2015   Procedure: TOTAL HIP ARTHROPLASTY ANTERIOR APPROACH;  Surgeon: Rod Can, MD;  Location: [facility];  Service: Orthopedics;  Laterality: Right;   TRANSTHORACIC ECHOCARDIOGRAM  07/2012   LV cavity size mildly reduced, normal wall motion; MV with calcified annulus and mild MR; LA mildly dilated; atrial septum with increased thickness - lipomatous hypertrophy; RV systolic pressure increased (borderline pulm HTN)   UPPER GASTROINTESTINAL ENDOSCOPY     Patient Active Problem List   Diagnosis Date Noted   Fecal smearing 02/14/2021   Weakness 01/03/2021   Subacute frontal sinusitis 08/19/2020   Other headache syndrome 07/03/2020   Herpes zoster dermatitis 02/16/2020   IDA (iron deficiency anemia) 05/07/2019   Palpitations 11/14/2018   Thrombocytosis 07/23/2015   Baker's cyst of knee 02/14/2013   Coronary artery spasm ([place]) 11/19/2012   Transverse myelitis ([place]) 10/10/2012   HTN (hypertension) 08/29/2012   Arthritis of neck 08/29/2012   Urinary retention 02/20/2012   Lower extremity weakness 02/20/2012   Adenocarcinoma of unknown primary ([place]) 02/06/2011   Anxiety 05/19/2010   Coronary atherosclerosis 12/03/2009  GERD 12/03/2009    REFERRING PROVIDER: Kai Levins MD  REFERRING DIAG: Bilateral posterior neck pain  THERAPY DIAG:  Cervicalgia  Abnormal posture  Other muscle spasm  Rationale for Evaluation and Treatment Rehabilitation  ONSET DATE: Ongoing but especially over last three months.  SUBJECTIVE:                                                                                                                                                                                                         SUBJECTIVE STATEMENT: Can see improvements.  PERTINENT HISTORY:  HTN, h/o dizziness, right THA, CAD.  PAIN:  Are you having pain? No just stiffness.   WEIGHT BEARING  RESTRICTIONS No  FALLS:  Has patient fallen in last 6 months? No  LIVING ENVIRONMENT: Lives with: lives with their spouse Lives in: House/apartment Has following equipment at home: None    PLOF: Independent with basic ADLs  PATIENT GOALS Not have headaches and neck pain  OBJECTIVE:   DIAGNOSTIC FINDINGS:  MRI: CERVICAL SPINE 1. No significant disc protrusion, foraminal stenosis or central canal stenosis of the cervical spine. No acute injury of the cervical spine. Normal cervical spinal cord.  PATIENT SURVEYS:  FOTO Complete.  POSTURE: rounded shoulders and forward head  PALPATION: Diffuse tenderness and tautness over patient's bilateral cervical paraspinal musculature, UT's and c/o pain over suboccipital region bilaterally.   CERVICAL ROM:   Active ROM A/PROM (deg) eval  Flexion   Extension   Right lateral flexion 20  Left lateral flexion 10  Right rotation 53  Left rotation 50    UPPER EXTREMITY MMT:  Normal bilateral UE strength.  DTR's:  Normal bilateral UE DTR's.  TODAY'S TREATMENT:  Modalities  Date:10/31/21  Unattended Estim: Cervical, Pre-mod, 15 mins, Tone Combo: Cervical, 1.5 w/cm2, 100%, 1 mhz, 10 mins, Tone Hot Pack: Cervical, 15 mins, Tone  Manual Therapy Soft Tissue Mobilization: B UTs, suboccipitals, cervical paraspinals and scalenes, STW to B UT and cervical paraspinals to reduce stiffness and tone    HOME EXERCISE PROGRAM: Chin tucks.  ASSESSMENT:  CLINICAL IMPRESSION: Patient presented in clinic with reports of seeing overall improvement since last PT session. Patient did indicate more soreness along suboccipitals and cervical/thoracic paraspinals. Patient presented with greater tone in B subocicpitals. Normal modalities response noted following removal of the modalities.  OBJECTIVE IMPAIRMENTS decreased activity tolerance, decreased ROM, increased muscle spasms, postural dysfunction, and pain.   ACTIVITY LIMITATIONS  sitting  REHAB POTENTIAL: Good  CLINICAL DECISION MAKING: Stable/uncomplicated  EVALUATION COMPLEXITY: Low   GOALS: Goals reviewed with patient? Yes  SHORT TERM GOALS: Target date: 11/14/2021   Ind with a HEP. Goal status: INITIAL  LONG TERM GOALS: Target date: 12/12/2021  1.  Increase active cervical rotation to 65-70 degrees+ so patient can turn head more easily while driving. Goal status: INITIAL  2.  Decrease headaches to no more than once per week with a decrease intensity of at least 50 percent. Goal status: INITIAL PLAN: PT FREQUENCY: 2x/week  PT DURATION: 6 weeks  PLANNED INTERVENTIONS: Therapeutic exercises, Therapeutic activity, Patient/Family education, Dry Needling, Electrical stimulation, Cryotherapy, Moist heat, Ultrasound, and Manual therapy  PLAN FOR NEXT SESSION: Combo e'stim/US, STW/M, postural improvement exercises.   Standley Brooking, PTA 10/31/2021, 2:43 PM

## 2021-11-01 ENCOUNTER — Ambulatory Visit
Admission: RE | Admit: 2021-11-01 | Discharge: 2021-11-01 | Disposition: A | Discharge status: Home / Self Care | Payer: Medicare Other | Source: Ambulatory Visit | Attending: Neurology | Admitting: Neurology

## 2021-11-01 DIAGNOSIS — R42 Dizziness and giddiness: Secondary | ICD-10-CM

## 2021-11-01 DIAGNOSIS — R29898 Other symptoms and signs involving the musculoskeletal system: Secondary | ICD-10-CM

## 2021-11-01 DIAGNOSIS — G373 Acute transverse myelitis in demyelinating disease of central nervous system: Secondary | ICD-10-CM

## 2021-11-01 DIAGNOSIS — M542 Cervicalgia: Secondary | ICD-10-CM

## 2021-11-01 DIAGNOSIS — I6782 Cerebral ischemia: Secondary | ICD-10-CM | POA: Diagnosis not present

## 2021-11-01 DIAGNOSIS — R519 Headache, unspecified: Secondary | ICD-10-CM

## 2021-11-01 MED ORDER — GADOBENATE DIMEGLUMINE 529 MG/ML IV SOLN
15.0000 mL | Freq: Once | INTRAVENOUS | Status: AC | PRN
  Administered 2021-11-01: 15 mL via INTRAVENOUS

## 2021-11-02 ENCOUNTER — Encounter: Payer: Medicare Other | Admitting: Physical Therapy

## 2021-11-11 ENCOUNTER — Encounter: Payer: Self-pay | Admitting: Family Medicine

## 2021-11-11 ENCOUNTER — Ambulatory Visit (INDEPENDENT_AMBULATORY_CARE_PROVIDER_SITE_OTHER): Payer: Medicare Other | Admitting: Family Medicine

## 2021-11-11 VITALS — BP 102/66 | HR 76 | Temp 98.7°F | Ht 63.0 in | Wt 151.0 lb

## 2021-11-11 DIAGNOSIS — R5381 Other malaise: Secondary | ICD-10-CM

## 2021-11-11 DIAGNOSIS — R5383 Other fatigue: Secondary | ICD-10-CM

## 2021-11-11 DIAGNOSIS — E559 Vitamin D deficiency, unspecified: Secondary | ICD-10-CM | POA: Diagnosis not present

## 2021-11-11 DIAGNOSIS — R031 Nonspecific low blood-pressure reading: Secondary | ICD-10-CM | POA: Diagnosis not present

## 2021-11-11 DIAGNOSIS — R531 Weakness: Secondary | ICD-10-CM | POA: Diagnosis not present

## 2021-11-11 MED ORDER — ISOSORBIDE MONONITRATE ER 60 MG PO TB24: 60.0000 mg | ORAL_TABLET | Freq: Every day | ORAL | 1 refills | Status: DC

## 2021-11-11 NOTE — Progress Notes (Signed)
Subjective:  Patient ID: Amy Black, female    DOB: 03-Jul-1940, 81 y.o.   MRN: [id number]  Patient Care Team: [name], MD as PCP - General (Family Medicine) Minus Breeding, MD as PCP - Cardiology (Cardiology) Lonna Cobb, [place] (Inactive) as Physical Therapist (Physical Therapy) Celso Amy, NP as Nurse Practitioner (Nurse Practitioner) Doran Stabler, MD as Consulting Physician (Gastroenterology) Rod Can, MD as Consulting Physician (Orthopedic Surgery) Volanda Napoleon, MD as Consulting Physician (Oncology)   Chief Complaint:  Fatigue (weakness)   HPI: Amy Black is a 81 y.o. female presenting on 11/11/2021 for Fatigue (weakness)   Pt presents today with complaints of fatigue, malaise, and general weakness. States she gets up in the morning and feels ready to go, states after a few hours she becomes very tired and does not want to do anything. She reports general weakness. No recent injuries, illnesses, procedures, or hospital stays. She is currently on verapamil 120 mg daily, dyazide 37.5-25 mg daily, and isosorbide 120 mg daily. She denies other associated symptoms. Does not take SL NTG as she has not needed it.     Relevant past medical, surgical, family, and social history reviewed and updated as indicated.  Allergies and medications reviewed and updated. Data reviewed: Chart in Epic.   Past Medical History:  Diagnosis Date   Adenocarcinoma ([place])    LEFT LEG   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 minimal CAD by cath in 2016 with presumed spasm (20% mLAD, 20% D2).   Cataract    bilateral cataracts removed   Coronary artery spasm ([facility])    Diverticulosis of colon (without mention of hemorrhage)    Femoral neck fracture, right, closed, initial encounter 10/04/2015   GERD (gastroesophageal reflux disease)    hepatic cyst    History of nuclear  stress test 04/2011   lexiscan; normal study, no significant ischemia, low risk; 2015 - normal   HTN (hypertension)    PT. DENIES   Hyperlipidemia    Insomnia    Irritable bowel syndrome    Transverse myelitis ([place])    Vitamin D deficiency     Past Surgical History:  Procedure Laterality Date   ABDOMINAL HYSTERECTOMY Bilateral    CARDIAC CATHETERIZATION     coronary spasm   CARDIAC CATHETERIZATION N/A 08/28/2014   Procedure: Left Heart Cath and Coronary Angiography;  Surgeon: Jettie Booze, MD;  Location: [facility];  Service: Cardiovascular;  Laterality: N/A;   COLONOSCOPY     DIAGNOSTIC LAPAROSCOPY     LYSIS OF ADHESION     PELVIC LAPAROSCOPY  1992   RSO, AND LSO ON 2007   RESECTION SOFT TISSUE TUMOR LEG / ANKLE RADICAL  2004   leg lesion resection & radiation   SALPINGOOPHORECTOMY Left    TOTAL HIP ARTHROPLASTY Right 10/04/2015   Procedure: TOTAL HIP ARTHROPLASTY ANTERIOR APPROACH;  Surgeon: Rod Can, MD;  Location: [facility];  Service: Orthopedics;  Laterality: Right;   TRANSTHORACIC ECHOCARDIOGRAM  07/2012   LV cavity size mildly reduced, normal wall motion; MV with calcified annulus and mild MR; LA mildly dilated; atrial septum with increased thickness - lipomatous hypertrophy; RV systolic pressure increased (borderline pulm HTN)   UPPER GASTROINTESTINAL ENDOSCOPY      Social History   Socioeconomic History   Marital status: Married  Spouse name: Augustin Coupe   Number of children: 2   Years of education: 12   Highest education level: High school graduate  Occupational History   Occupation: [employer]   Occupation: DISABILITY    Employer: UNEMPLOYED  Tobacco Use   Smoking status: Former    Packs/day: 0.50    Years: 4.00    Total pack years: 2.00    Types: Cigarettes    Start date: 12/07/1968    Quit date: 11/20/1972    Years since quitting: 49.0   Smokeless tobacco: Never   Tobacco comments:    quit 40 years ago  Vaping Use   Vaping Use: Never used   Substance and Sexual Activity   Alcohol use: No    Alcohol/week: 0.0 standard drinks of alcohol   Drug use: No   Sexual activity: Not Currently    Birth control/protection: Surgical  Other Topics Concern   Not on file  Social History Narrative   Disabled, lives with husband and daughter.  Enjoys shopping. Lives in two story home   1 son and 1 daughter.   1 grandson.   Caffeine 2 cans of soda and tea   Right handed   Social Determinants of Health   Financial Resource Strain: Low Risk  (02/14/2021)   Overall Financial Resource Strain (CARDIA)    Difficulty of Paying Living Expenses: Not hard at all  Food Insecurity: No Food Insecurity (02/14/2021)   Hunger Vital Sign    Worried About Running Out of Food in the Last Year: Never true    Ran Out of Food in the Last Year: Never true  Transportation Needs: No Transportation Needs (02/14/2021)   PRAPARE - Hydrologist (Medical): No    Lack of Transportation (Non-Medical): No  Physical Activity: Insufficiently Active (02/14/2021)   Exercise Vital Sign    Days of Exercise per Week: 5 days    Minutes of Exercise per Session: 20 min  Stress: No Stress Concern Present (02/14/2021)   [place]    Feeling of Stress : Only a little  Social Connections: Socially Integrated (02/14/2021)   Social Connection and Isolation Panel [NHANES]    Frequency of Communication with Friends and Family: More than three times a week    Frequency of Social Gatherings with Friends and Family: More than three times a week    Attends Religious Services: More than 4 times per year    Active Member of [employer] or Organizations: Yes    Attends Music therapist: More than 4 times per year    Marital Status: Married  Human resources officer Violence: Not At Risk (02/14/2021)   Humiliation, Afraid, Rape, and Kick questionnaire    Fear of Current or Ex-Partner: No    Emotionally  Abused: No    Physically Abused: No    Sexually Abused: No    Outpatient Encounter Medications as of 11/11/2021  Medication Sig   ALLERGY RELIEF 10 MG tablet TAKE ONE TABLET BY MOUTH DAILY   ALPRAZolam (XANAX) 0.5 MG tablet Take 1 tablet (0.5 mg total) by mouth 3 (three) times daily.   Calcium Citrate-Vitamin D (CALCIUM CITRATE + D3) 315-250 MG-UNIT TABS Take 2 tablets by mouth daily.   cyclobenzaprine (FLEXERIL) 5 MG tablet TAKE ONE TABLET BY MOUTH THREE TIMES DAILY AS NEEDED FOR MUSCLE SPASMS.   fluticasone (FLONASE) 50 MCG/ACT nasal spray Place 2 sprays into both nostrils daily.   hydrOXYzine (ATARAX) 10 MG  tablet TAKE 1 TABLET TWICE DAILY.   Hyoscyamine Sulfate SL (LEVSIN/SL) 0.125 MG SUBL Place 0.125 mg under the tongue every 4 (four) hours as needed (abd cramping and diarrhea).   Iron-FA-B Cmp-C-Biot-Probiotic (FUSION PLUS) CAPS Take 1 capsule by mouth daily.   isosorbide mononitrate (IMDUR) 60 MG 24 hr tablet Take 1 tablet (60 mg total) by mouth daily.   lidocaine (XYLOCAINE) 5 % ointment Apply 1 application. topically as needed.   meloxicam (MOBIC) 7.5 MG tablet Take 7.5 mg by mouth daily.   mometasone (ELOCON) 0.1 % cream Apply 1 Application topically daily. To affected eye brow area   nitroGLYCERIN (NITROSTAT) 0.4 MG SL tablet DISSOLVE ONE TABLET UNDER TONGUE EVERY 5 MINUTES UP TO 3 DOSES AS NEEDED FOR CHEST PAIN   traMADol (ULTRAM) 50 MG tablet TAKE ONE TABLET BY MOUTH EVERY TWELVE HOURS AS NEEDED.   triamterene-hydrochlorothiazide (DYAZIDE) 37.5-25 MG capsule TAKE ONE CAPSULE BY MOUTH 3 TIMES PER WEEK   verapamil (CALAN-SR) 120 MG CR tablet Take 1 tablet (120 mg total) by mouth at bedtime.   [DISCONTINUED] amoxicillin-clavulanate (AUGMENTIN) 875-125 MG tablet Take 1 tablet by mouth 2 (two) times daily. Take all of this medication   [DISCONTINUED] isosorbide mononitrate (IMDUR) 120 MG 24 hr tablet TAKE TWO (2) TABLETS BY MOUTH ONCE DAILY   No facility-administered encounter  medications on file as of 11/11/2021.    Allergies  Allergen Reactions   Iodine Itching   Ivp Dye [Iodinated Contrast Media] Itching   Amlodipine Other (See Comments)    headaches   Lexapro [Escitalopram Oxalate] Other (See Comments)    Drunk, High feeling   Cymbalta [Duloxetine Hcl] Other (See Comments)    sleepiness   Gabapentin Other (See Comments)    sleepiness   Zanaflex [Tizanidine Hcl] Other (See Comments)    Very drunk feeling next day    Review of Systems  Constitutional:  Positive for activity change, appetite change and fatigue. Negative for chills, diaphoresis, fever and unexpected weight change.  HENT: Negative.    Eyes: Negative.   Respiratory:  Negative for apnea, cough, choking, chest tightness, shortness of breath, wheezing and stridor.   Cardiovascular:  Negative for chest pain, palpitations and leg swelling.  Gastrointestinal:  Negative for abdominal distention, abdominal pain, anal bleeding, blood in stool, constipation, diarrhea, nausea and vomiting.  Endocrine: Negative.   Genitourinary:  Negative for decreased urine volume, difficulty urinating, dysuria, frequency and urgency.  Musculoskeletal:  Negative for arthralgias and myalgias.  Skin: Negative.   Allergic/Immunologic: Negative.   Neurological:  Positive for weakness (generalized). Negative for dizziness, tremors, seizures, syncope, facial asymmetry, speech difficulty, light-headedness, numbness and headaches.  Hematological: Negative.  Does not bruise/bleed easily.  Psychiatric/Behavioral:  Negative for confusion, hallucinations, sleep disturbance and suicidal ideas.   All other systems reviewed and are negative.       Objective:  BP 102/66   Pulse 76   Temp 98.7 F (37.1 C)   Ht 5\' 3"  (1.6 m)   Wt 151 lb (68.5 kg)   SpO2 98%   BMI 26.75 kg/m    Wt Readings from Last 3 Encounters:  11/11/21 151 lb (68.5 kg)  10/13/21 156 lb (70.8 kg)  09/19/21 160 lb 9.6 oz (72.8 kg)    Physical  Exam Vitals and nursing note reviewed.  Constitutional:      General: She is not in acute distress.    Appearance: Normal appearance. She is well-developed, well-groomed and overweight. She is not ill-appearing, toxic-appearing or diaphoretic.  HENT:     Head: Normocephalic and atraumatic.     Nose: Nose normal.     Mouth/Throat:     Mouth: Mucous membranes are moist.     Pharynx: Oropharynx is clear.  Eyes:     Conjunctiva/sclera: Conjunctivae normal.     Pupils: Pupils are equal, round, and reactive to light.  Cardiovascular:     Rate and Rhythm: Normal rate and regular rhythm.     Pulses: Normal pulses.     Heart sounds: Normal heart sounds. No murmur heard.    No friction rub. No gallop.  Pulmonary:     Effort: Pulmonary effort is normal.     Breath sounds: Normal breath sounds.  Abdominal:     General: Bowel sounds are normal.     Palpations: Abdomen is soft.  Musculoskeletal:     Right lower leg: No edema.     Left lower leg: No edema.  Skin:    General: Skin is warm and dry.     Capillary Refill: Capillary refill takes less than 2 seconds.  Neurological:     General: No focal deficit present.     Mental Status: She is alert and oriented to person, place, and time.     Cranial Nerves: No cranial nerve deficit.     Motor: No weakness.     Gait: Gait abnormal (using cane).  Psychiatric:        Mood and Affect: Mood normal.        Behavior: Behavior normal.        Thought Content: Thought content normal.        Judgment: Judgment normal.     Results for orders placed or performed in visit on 10/13/21  C-reactive protein  Result Value Ref Range   CRP <1.0 0.5 - 20.0 mg/dL  Sedimentation rate  Result Value Ref Range   Sed Rate 28 0 - 30 mm/hr   *Note: Due to a large number of results and/or encounters for the requested time period, some results have not been displayed. A complete set of results can be found in Results Review.       Pertinent labs & imaging  results that were available during my care of the patient were reviewed by me and considered in my medical decision making.  Assessment & Plan:  [place] was seen today for fatigue.  Diagnoses and all orders for this visit:  Malaise and fatigue Low blood pressure reading General weakness Vitamin D deficiency  No acute distress in office. BP low normal reading. Will check labs for potential underlying causes of fatigue and malaise. Will decrease dose of isosorbide as to see if beneficial. Pt to follow up in 2 weeks for reevaluation, sooner if warranted. If BP still low normal, can adjust regimen further.  -     CBC with Differential/Platelet -     CMP14+EGFR -     Thyroid Panel With TSH -     VITAMIN D 25 Hydroxy (Vit-D Deficiency, Fractures) -     isosorbide mononitrate (IMDUR) 60 MG 24 hr tablet; Take 1 tablet (60 mg total) by mouth daily.   Continue all other maintenance medications.  Follow up plan: Return in about 2 weeks (around 11/25/2021) for HTN.   Continue healthy lifestyle choices, including diet (rich in fruits, vegetables, and lean proteins, and low in salt and simple carbohydrates) and exercise (at least 30 minutes of moderate physical activity daily).   The above assessment and management plan was  discussed with the patient. The patient verbalized understanding of and has agreed to the management plan. Patient is aware to call the clinic if they develop any new symptoms or if symptoms persist or worsen. Patient is aware when to return to the clinic for a follow-up visit. Patient educated on when it is appropriate to go to the emergency department.   Monia Pouch, FNP-C [facility] [phone]

## 2021-11-11 NOTE — Patient Instructions (Signed)
Stop isosorbide 120 mg and start taking isosorbide 60 mg.

## 2021-11-12 LAB — CBC WITH DIFFERENTIAL/PLATELET
Basophils Absolute: 0 10*3/uL (ref 0.0–0.2)
Basos: 1 %
EOS (ABSOLUTE): 0.1 10*3/uL (ref 0.0–0.4)
Eos: 1 %
Hematocrit: 34.7 % (ref 34.0–46.6)
Hemoglobin: 11.1 g/dL (ref 11.1–15.9)
Immature Grans (Abs): 0 10*3/uL (ref 0.0–0.1)
Immature Granulocytes: 0 %
Lymphocytes Absolute: 1.5 10*3/uL (ref 0.7–3.1)
Lymphs: 31 %
MCH: 29.7 pg (ref 26.6–33.0)
MCHC: 32 g/dL (ref 31.5–35.7)
MCV: 93 fL (ref 79–97)
Monocytes Absolute: 0.3 10*3/uL (ref 0.1–0.9)
Monocytes: 7 %
Neutrophils Absolute: 2.9 10*3/uL (ref 1.4–7.0)
Neutrophils: 60 %
Platelets: 412 10*3/uL (ref 150–450)
RBC: 3.74 x10E6/uL — ABNORMAL LOW (ref 3.77–5.28)
RDW: 12.5 % (ref 11.7–15.4)
WBC: 4.9 10*3/uL (ref 3.4–10.8)

## 2021-11-12 LAB — THYROID PANEL WITH TSH
Free Thyroxine Index: 2.1 (ref 1.2–4.9)
T3 Uptake Ratio: 27 % (ref 24–39)
T4, Total: 7.6 ug/dL (ref 4.5–12.0)
TSH: 0.049 u[IU]/mL — ABNORMAL LOW (ref 0.450–4.500)

## 2021-11-12 LAB — CMP14+EGFR
ALT: 7 IU/L (ref 0–32)
AST: 14 IU/L (ref 0–40)
Albumin/Globulin Ratio: 1.6 (ref 1.2–2.2)
Albumin: 4.1 g/dL (ref 3.8–4.8)
Alkaline Phosphatase: 96 IU/L (ref 44–121)
BUN/Creatinine Ratio: 12 (ref 12–28)
BUN: 9 mg/dL (ref 8–27)
Bilirubin Total: 0.4 mg/dL (ref 0.0–1.2)
CO2: 24 mmol/L (ref 20–29)
Calcium: 9.6 mg/dL (ref 8.7–10.3)
Chloride: 102 mmol/L (ref 96–106)
Creatinine, Ser: 0.76 mg/dL (ref 0.57–1.00)
Globulin, Total: 2.6 g/dL (ref 1.5–4.5)
Glucose: 105 mg/dL — ABNORMAL HIGH (ref 70–99)
Potassium: 4.3 mmol/L (ref 3.5–5.2)
Sodium: 140 mmol/L (ref 134–144)
Total Protein: 6.7 g/dL (ref 6.0–8.5)
eGFR: 80 mL/min/{1.73_m2} (ref >59)

## 2021-11-12 LAB — VITAMIN D 25 HYDROXY (VIT D DEFICIENCY, FRACTURES): Vit D, 25-Hydroxy: 28.7 ng/mL — ABNORMAL LOW (ref 30.0–100.0)

## 2021-11-15 NOTE — Progress Notes (Signed)
Patient returning call. Please call back

## 2021-11-21 ENCOUNTER — Telehealth: Payer: Self-pay | Admitting: *Deleted

## 2021-11-21 ENCOUNTER — Telehealth: Payer: Self-pay | Admitting: Cardiology

## 2021-11-21 NOTE — Telephone Encounter (Signed)
Received message from patient to call her back.  Called patient at [phone] but no answering machine.

## 2021-11-21 NOTE — Telephone Encounter (Signed)
Spoke to patient stated she saw PCP last week.She was complaining of sleepiness.She decreased Imdur to 60 mg daily.She wanted to make sure Dr.Hochrein agreed.I will send message to him for advice.

## 2021-11-21 NOTE — Telephone Encounter (Signed)
Pt c/o medication issue:  1. Name of Medication:  isosorbide mononitrate (IMDUR) 60 MG 24 hr tablet   2. How are you currently taking this medication (dosage and times per day)? Take 1 tablet (60 mg total) by mouth daily.  3. Are you having a reaction (difficulty breathing--STAT)? no  4. What is your medication issue? Patient states she was taking '120mg'$  of this medication.  He PCP recently changed her to only take '60mg'$  daily.  She wanted to make Dr. Percival Spanish aware of the medication change and make sure he was in agreement with this change.

## 2021-11-22 ENCOUNTER — Telehealth: Payer: Self-pay

## 2021-11-22 NOTE — Telephone Encounter (Signed)
Returned phone call to patient who stated she wanted to be seen by a provider to evaluate her left breast pain. Pt states for the past 2 months she has had a sore and stinging pain that starts in her left breast and extends to her left shoulder blade. Pt states soreness is worse with wearing a bra. Pt denies any discharge or trauma or discoloration. Pt denies any trauma to site. Pt states that the soreness and stinging are intermittent and activity does not increase soreness. Pt denies any lumps to her left breast. Pt agreeable to plan to have scheduling  call and make an appointment to be seen by first available provider. Message sent to scheduling.

## 2021-11-23 NOTE — Telephone Encounter (Signed)
Spoke to patient she stated she is less sleepy since she decreased Imdur to 60 daily.No chest pain.She will continue 60 mg daily.Advised to keep appointment with Dr.Hochrein as planned and call sooner if needed.

## 2021-11-24 ENCOUNTER — Encounter: Payer: Self-pay | Admitting: Medical Oncology

## 2021-11-24 ENCOUNTER — Inpatient Hospital Stay: Payer: Medicare Other

## 2021-11-24 ENCOUNTER — Inpatient Hospital Stay: Payer: Medicare Other | Attending: Hematology & Oncology | Admitting: Medical Oncology

## 2021-11-24 VITALS — BP 139/67 | HR 74 | Temp 98.0°F | Resp 17 | Wt 150.0 lb

## 2021-11-24 DIAGNOSIS — D75839 Thrombocytosis, unspecified: Secondary | ICD-10-CM | POA: Insufficient documentation

## 2021-11-24 DIAGNOSIS — C801 Malignant (primary) neoplasm, unspecified: Secondary | ICD-10-CM | POA: Diagnosis not present

## 2021-11-24 DIAGNOSIS — N644 Mastodynia: Secondary | ICD-10-CM | POA: Diagnosis not present

## 2021-11-24 DIAGNOSIS — Z859 Personal history of malignant neoplasm, unspecified: Secondary | ICD-10-CM | POA: Diagnosis not present

## 2021-11-24 NOTE — Progress Notes (Signed)
Hematology and Oncology Follow Up Visit  Amy Black [id number] Jan 18, 1941 81 y.o. 11/24/2021   Principle Diagnosis:  Transient thrombocytosis History transverse myelitis History of adenosquamous carcinoma of unknown primary Iron def anemia  Current Therapy:   Fusion Plus -- daily   Interim History:  Amy Black is here today for left breast pain.   She reports that this has been occurring for about 1-2 months. No injury. Starts around her left lateral breast and wraps around to her back on the left side. Feels like a stinging tingling sensation. She has not noticed any skin rash, bumps, nipple discharge, night sweats. She has not tried anything for symptoms. She has had shingles multiple times however it is usually in her scalp.   Overall, I would say performance status is probably ECOG 1.     Medications:  Allergies as of 11/24/2021       Reactions   Iodine Itching   Ivp Dye [iodinated Contrast Media] Itching   Amlodipine Other (See Comments)   headaches   Lexapro [escitalopram Oxalate] Other (See Comments)   Drunk, High feeling   Cymbalta [duloxetine Hcl] Other (See Comments)   sleepiness   Gabapentin Other (See Comments)   sleepiness   Zanaflex [tizanidine Hcl] Other (See Comments)   Very drunk feeling next day        Medication List        Accurate as of November 24, 2021 11:48 AM. If you have any questions, ask your nurse or doctor.          Allergy Relief 10 MG tablet Generic drug: loratadine TAKE ONE TABLET BY MOUTH DAILY   ALPRAZolam 0.5 MG tablet Commonly known as: XANAX Take 1 tablet (0.5 mg total) by mouth 3 (three) times daily.   Calcium Citrate-Vitamin D 315-250 MG-UNIT Tabs Commonly known as: Calcium Citrate + D3 Take 2 tablets by mouth daily.   cyclobenzaprine 5 MG tablet Commonly known as: FLEXERIL TAKE ONE TABLET BY MOUTH THREE TIMES DAILY AS NEEDED FOR MUSCLE SPASMS.   fluticasone 50 MCG/ACT nasal spray Commonly known as:  FLONASE Place 2 sprays into both nostrils daily.   Fusion Plus Caps Take 1 capsule by mouth daily.   hydrOXYzine 10 MG tablet Commonly known as: ATARAX TAKE 1 TABLET TWICE DAILY.   Hyoscyamine Sulfate SL 0.125 MG Subl Commonly known as: Levsin/SL Place 0.125 mg under the tongue every 4 (four) hours as needed (abd cramping and diarrhea).   isosorbide mononitrate 60 MG 24 hr tablet Commonly known as: IMDUR Take 1 tablet (60 mg total) by mouth daily.   lidocaine 5 % ointment Commonly known as: XYLOCAINE Apply 1 application. topically as needed.   meloxicam 7.5 MG tablet Commonly known as: MOBIC Take 7.5 mg by mouth daily.   mometasone 0.1 % cream Commonly known as: Elocon Apply 1 Application topically daily. To affected eye brow area   nitroGLYCERIN 0.4 MG SL tablet Commonly known as: NITROSTAT DISSOLVE ONE TABLET UNDER TONGUE EVERY 5 MINUTES UP TO 3 DOSES AS NEEDED FOR CHEST PAIN   traMADol 50 MG tablet Commonly known as: ULTRAM TAKE ONE TABLET BY MOUTH EVERY TWELVE HOURS AS NEEDED.   triamterene-hydrochlorothiazide 37.5-25 MG capsule Commonly known as: DYAZIDE TAKE ONE CAPSULE BY MOUTH 3 TIMES PER WEEK   verapamil 120 MG CR tablet Commonly known as: CALAN-SR Take 1 tablet (120 mg total) by mouth at bedtime.        Allergies:  Allergies  Allergen Reactions   Iodine Itching  Ivp Dye [Iodinated Contrast Media] Itching   Amlodipine Other (See Comments)    headaches   Lexapro [Escitalopram Oxalate] Other (See Comments)    Drunk, High feeling   Cymbalta [Duloxetine Hcl] Other (See Comments)    sleepiness   Gabapentin Other (See Comments)    sleepiness   Zanaflex [Tizanidine Hcl] Other (See Comments)    Very drunk feeling next day    Past Medical History, Surgical history, Social history, and Family History were reviewed and updated.  Review of Systems: Review of Systems  Constitutional: Negative.   HENT: Negative.    Eyes: Negative.   Respiratory:  Negative.    Cardiovascular: Negative.   Gastrointestinal: Negative.   Genitourinary: Negative.   Musculoskeletal: Negative.   Skin: Negative.   Neurological: Negative.   Endo/Heme/Allergies: Negative.   Psychiatric/Behavioral: Negative.        Physical Exam:  weight is 150 lb (68 kg). Her oral temperature is 98 F (36.7 C). Her blood pressure is 139/67 and her pulse is 74. Her respiration is 17 and oxygen saturation is 100%.   Wt Readings from Last 3 Encounters:  11/24/21 150 lb (68 kg)  11/11/21 151 lb (68.5 kg)  10/13/21 156 lb (70.8 kg)    Physical Exam Vitals reviewed.  HENT:     Head: Normocephalic and atraumatic.  Eyes:     Pupils: Pupils are equal, round, and reactive to light.  Cardiovascular:     Rate and Rhythm: Normal rate and regular rhythm.     Heart sounds: Normal heart sounds.  Pulmonary:     Effort: Pulmonary effort is normal.     Breath sounds: Normal breath sounds.  Abdominal:     General: Bowel sounds are normal.     Palpations: Abdomen is soft.  Musculoskeletal:        General: No tenderness or deformity. Normal range of motion.     Cervical back: Normal range of motion.     Comments: Mild left breast tenderness at 6 o'clock without palpable mass. No adenopathy or nipple discharge noted. Right breast appears normal.   Lymphadenopathy:     Cervical: No cervical adenopathy.  Skin:    General: Skin is warm and dry.     Findings: No erythema or rash.  Neurological:     Mental Status: She is alert and oriented to person, place, and time.  Psychiatric:        Behavior: Behavior normal.        Thought Content: Thought content normal.        Judgment: Judgment normal.      Lab Results  Component Value Date   WBC 4.9 11/11/2021   HGB 11.1 11/11/2021   HCT 34.7 11/11/2021   MCV 93 11/11/2021   PLT 412 11/11/2021   Lab Results  Component Value Date   FERRITIN 281 04/28/2021   IRON 92 10/18/2020   TIBC 235 (L) 10/18/2020   UIBC 143  10/18/2020   IRONPCTSAT 39 (H) 10/18/2020   Lab Results  Component Value Date   RETICCTPCT 2.0 04/28/2021   RBC 3.74 (L) 11/11/2021   No results found for: "KPAFRELGTCHN", "LAMBDASER", "KAPLAMBRATIO" No results found for: "IGGSERUM", "IGA", "IGMSERUM" No results found for: "TOTALPROTELP", "ALBUMINELP", "A1GS", "A2GS", "BETS", "BETA2SER", "GAMS", "MSPIKE", "SPEI"   Chemistry      Component Value Date/Time   NA 140 11/11/2021 1427   NA 138 01/28/2016 1303   K 4.3 11/11/2021 1427   K 4.2 07/28/2016 1243   K 4.2  01/28/2016 1303   CL 102 11/11/2021 1427   CL 98 07/28/2016 1243   CL 101 07/20/2014 1131   CO2 24 11/11/2021 1427   CO2 29 07/28/2016 1243   CO2 30 (H) 01/28/2016 1303   BUN 9 11/11/2021 1427   BUN 10.0 01/28/2016 1303   CREATININE 0.76 11/11/2021 1427   CREATININE 0.78 04/28/2021 1350   CREATININE 0.94 07/28/2016 1243   CREATININE 0.8 01/28/2016 1303      Component Value Date/Time   CALCIUM 9.6 11/11/2021 1427   CALCIUM 9.9 07/28/2016 1243   CALCIUM 10.0 01/28/2016 1303   ALKPHOS 96 11/11/2021 1427   ALKPHOS 105 07/28/2016 1243   ALKPHOS 123 01/28/2016 1303   AST 14 11/11/2021 1427   AST 19 04/28/2021 1350   AST 13 01/28/2016 1303   ALT 7 11/11/2021 1427   ALT 16 04/28/2021 1350   ALT 12 01/28/2016 1303   BILITOT 0.4 11/11/2021 1427   BILITOT 0.5 04/28/2021 1350   BILITOT 0.51 01/28/2016 1303       Impression and Plan: Amy Black is a very pleasant 81yo African American female with history of transient thrombocytosis and also history of adenosquamous carcinoma with unknown primary.   Likely shingles vs neuritis vs other. Given her history of adenocarcinoma of unknown primary, point tenderness of breast, and the fact that she is almost due for her next mammogram we will obtain a diagnostic mammogram and [place] to ensure this appears normal. We discussed various treatments for her symptoms including gabapentin vs steroids as she is too far out from when this  started for antiviral medications. She has follow up with her PCP tomorrow to discuss this further.     Hughie Closs, PA-C 10/19/202311:48 AM

## 2021-11-25 ENCOUNTER — Ambulatory Visit (INDEPENDENT_AMBULATORY_CARE_PROVIDER_SITE_OTHER): Payer: Medicare Other | Admitting: Family Medicine

## 2021-11-25 ENCOUNTER — Encounter: Payer: Self-pay | Admitting: Family Medicine

## 2021-11-25 VITALS — BP 138/75 | HR 67 | Temp 97.5°F | Ht 63.0 in | Wt 149.8 lb

## 2021-11-25 DIAGNOSIS — R5381 Other malaise: Secondary | ICD-10-CM | POA: Diagnosis not present

## 2021-11-25 DIAGNOSIS — R5383 Other fatigue: Secondary | ICD-10-CM

## 2021-11-25 DIAGNOSIS — R031 Nonspecific low blood-pressure reading: Secondary | ICD-10-CM | POA: Diagnosis not present

## 2021-11-25 DIAGNOSIS — M792 Neuralgia and neuritis, unspecified: Secondary | ICD-10-CM | POA: Diagnosis not present

## 2021-11-25 MED ORDER — PREDNISONE 20 MG PO TABS: ORAL_TABLET | ORAL | 0 refills | Status: DC

## 2021-11-25 NOTE — Progress Notes (Signed)
Subjective:  Patient ID: Amy Black, female    DOB: 31-Dec-1940, 81 y.o.   MRN: [id number]  Patient Care Team: [name], MD as PCP - General (Family Medicine) Minus Breeding, MD as PCP - Cardiology (Cardiology) Lonna Cobb, PT (Inactive) as Physical Therapist (Physical Therapy) Celso Amy, NP as Nurse Practitioner (Nurse Practitioner) Doran Stabler, MD as Consulting Physician (Gastroenterology) Rod Can, MD as Consulting Physician (Orthopedic Surgery) Volanda Napoleon, MD as Consulting Physician (Oncology)   Chief Complaint:  Hypertension (2 week follow up)   HPI: Amy Black is a 81 y.o. female presenting on 11/25/2021 for Hypertension (2 week follow up)   1. Malaise and fatigue 2. Low blood pressure reading At last visit pt had complaints of malaise and fatigue. BP was noted to be low normal. Labs obtained and were unremarkable. Imdur dosing was decreased to 60 mg daily and pt reports she has tolerated this well. States she is no longer having the fatigue and malaise.   3. Burning sensation Burning sensation to left breast and chest wall. No associated rash, injury, or discoloration. Was evaluated by oncology yesterday and advised to see PCP.     Relevant past medical, surgical, family, and social history reviewed and updated as indicated.  Allergies and medications reviewed and updated. Data reviewed: Chart in Epic.   Past Medical History:  Diagnosis Date   Adenocarcinoma ([facility])    LEFT LEG   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 minimal CAD by cath in 2016 with presumed spasm (20% mLAD, 20% D2).   Cataract    bilateral cataracts removed   Coronary artery spasm ([facility])    Diverticulosis of colon (without mention of hemorrhage)    Femoral neck fracture, right, closed, initial encounter 10/04/2015   GERD (gastroesophageal reflux disease)     hepatic cyst    History of nuclear stress test 04/2011   lexiscan; normal study, no significant ischemia, low risk; 2015 - normal   HTN (hypertension)    PT. DENIES   Hyperlipidemia    Insomnia    Irritable bowel syndrome    Transverse myelitis ([facility])    Vitamin D deficiency     Past Surgical History:  Procedure Laterality Date   ABDOMINAL HYSTERECTOMY Bilateral    CARDIAC CATHETERIZATION     coronary spasm   CARDIAC CATHETERIZATION N/A 08/28/2014   Procedure: Left Heart Cath and Coronary Angiography;  Surgeon: Jettie Booze, MD;  Location: [facility];  Service: Cardiovascular;  Laterality: N/A;   COLONOSCOPY     DIAGNOSTIC LAPAROSCOPY     LYSIS OF ADHESION     PELVIC LAPAROSCOPY  1992   RSO, AND LSO ON 2007   RESECTION SOFT TISSUE TUMOR LEG / ANKLE RADICAL  2004   leg lesion resection & radiation   SALPINGOOPHORECTOMY Left    TOTAL HIP ARTHROPLASTY Right 10/04/2015   Procedure: TOTAL HIP ARTHROPLASTY ANTERIOR APPROACH;  Surgeon: Rod Can, MD;  Location: [facility];  Service: Orthopedics;  Laterality: Right;   TRANSTHORACIC ECHOCARDIOGRAM  07/2012   LV cavity size mildly reduced, normal wall motion; MV with calcified annulus and mild MR; LA mildly dilated; atrial septum with increased thickness - lipomatous hypertrophy; RV systolic pressure increased (borderline pulm HTN)   UPPER GASTROINTESTINAL ENDOSCOPY      Social History   Socioeconomic  History   Marital status: Married    Spouse name: Augustin Coupe   Number of children: 2   Years of education: 12   Highest education level: High school graduate  Occupational History   Occupation: [employer]   Occupation: DISABILITY    Employer: UNEMPLOYED  Tobacco Use   Smoking status: Former    Packs/day: 0.50    Years: 4.00    Total pack years: 2.00    Types: Cigarettes    Start date: 12/07/1968    Quit date: 11/20/1972    Years since quitting: 49.0   Smokeless tobacco: Never   Tobacco comments:    quit 40 years ago   Vaping Use   Vaping Use: Never used  Substance and Sexual Activity   Alcohol use: No    Alcohol/week: 0.0 standard drinks of alcohol   Drug use: No   Sexual activity: Not Currently    Birth control/protection: Surgical  Other Topics Concern   Not on file  Social History Narrative   Disabled, lives with husband and daughter.  Enjoys shopping. Lives in two story home   1 son and 1 daughter.   1 grandson.   Caffeine 2 cans of soda and tea   Right handed   Social Determinants of Health   Financial Resource Strain: Low Risk  (02/14/2021)   Overall Financial Resource Strain (CARDIA)    Difficulty of Paying Living Expenses: Not hard at all  Food Insecurity: No Food Insecurity (02/14/2021)   Hunger Vital Sign    Worried About Running Out of Food in the Last Year: Never true    Ran Out of Food in the Last Year: Never true  Transportation Needs: No Transportation Needs (02/14/2021)   PRAPARE - Hydrologist (Medical): No    Lack of Transportation (Non-Medical): No  Physical Activity: Insufficiently Active (02/14/2021)   Exercise Vital Sign    Days of Exercise per Week: 5 days    Minutes of Exercise per Session: 20 min  Stress: No Stress Concern Present (02/14/2021)   [place]    Feeling of Stress : Only a little  Social Connections: Socially Integrated (02/14/2021)   Social Connection and Isolation Panel [NHANES]    Frequency of Communication with Friends and Family: More than three times a week    Frequency of Social Gatherings with Friends and Family: More than three times a week    Attends Religious Services: More than 4 times per year    Active Member of [employer] or Organizations: Yes    Attends Music therapist: More than 4 times per year    Marital Status: Married  Human resources officer Violence: Not At Risk (02/14/2021)   Humiliation, Afraid, Rape, and Kick questionnaire    Fear of  Current or Ex-Partner: No    Emotionally Abused: No    Physically Abused: No    Sexually Abused: No    Outpatient Encounter Medications as of 11/25/2021  Medication Sig   ALLERGY RELIEF 10 MG tablet TAKE ONE TABLET BY MOUTH DAILY   ALPRAZolam (XANAX) 0.5 MG tablet Take 1 tablet (0.5 mg total) by mouth 3 (three) times daily.   Calcium Citrate-Vitamin D (CALCIUM CITRATE + D3) 315-250 MG-UNIT TABS Take 2 tablets by mouth daily.   cyclobenzaprine (FLEXERIL) 5 MG tablet TAKE ONE TABLET BY MOUTH THREE TIMES DAILY AS NEEDED FOR MUSCLE SPASMS.   fluticasone (FLONASE) 50 MCG/ACT nasal spray Place 2 sprays into  both nostrils daily.   hydrOXYzine (ATARAX) 10 MG tablet TAKE 1 TABLET TWICE DAILY.   Hyoscyamine Sulfate SL (LEVSIN/SL) 0.125 MG SUBL Place 0.125 mg under the tongue every 4 (four) hours as needed (abd cramping and diarrhea).   Iron-FA-B Cmp-C-Biot-Probiotic (FUSION PLUS) CAPS Take 1 capsule by mouth daily.   isosorbide mononitrate (IMDUR) 60 MG 24 hr tablet Take 1 tablet (60 mg total) by mouth daily.   lidocaine (XYLOCAINE) 5 % ointment Apply 1 application. topically as needed.   meloxicam (MOBIC) 7.5 MG tablet Take 7.5 mg by mouth daily.   mometasone (ELOCON) 0.1 % cream Apply 1 Application topically daily. To affected eye brow area   nitroGLYCERIN (NITROSTAT) 0.4 MG SL tablet DISSOLVE ONE TABLET UNDER TONGUE EVERY 5 MINUTES UP TO 3 DOSES AS NEEDED FOR CHEST PAIN   traMADol (ULTRAM) 50 MG tablet TAKE ONE TABLET BY MOUTH EVERY TWELVE HOURS AS NEEDED.   triamterene-hydrochlorothiazide (DYAZIDE) 37.5-25 MG capsule TAKE ONE CAPSULE BY MOUTH 3 TIMES PER WEEK   verapamil (CALAN-SR) 120 MG CR tablet Take 1 tablet (120 mg total) by mouth at bedtime.   [DISCONTINUED] predniSONE (DELTASONE) 20 MG tablet 2 po at sametime daily for 5 days- start tomorrow   predniSONE (DELTASONE) 20 MG tablet 2 po at sametime daily for 5 days- start tomorrow   No facility-administered encounter medications on file as  of 11/25/2021.    Allergies  Allergen Reactions   Iodine Itching   Ivp Dye [Iodinated Contrast Media] Itching   Amlodipine Other (See Comments)    headaches   Lexapro [Escitalopram Oxalate] Other (See Comments)    Drunk, High feeling   Cymbalta [Duloxetine Hcl] Other (See Comments)    sleepiness   Gabapentin Other (See Comments)    sleepiness   Zanaflex [Tizanidine Hcl] Other (See Comments)    Very drunk feeling next day    Review of Systems  Constitutional:  Negative for activity change, appetite change, chills, diaphoresis, fatigue, fever and unexpected weight change.  HENT: Negative.    Eyes: Negative.  Negative for photophobia and visual disturbance.  Respiratory:  Negative for cough and chest tightness.   Cardiovascular:  Negative for leg swelling.  Gastrointestinal:  Negative for blood in stool, constipation, diarrhea, nausea and vomiting.  Endocrine: Negative.  Negative for polydipsia, polyphagia and polyuria.  Genitourinary:  Negative for dysuria, frequency and urgency.  Musculoskeletal:  Negative for arthralgias and myalgias.  Skin: Negative.   Allergic/Immunologic: Negative.   Neurological:  Negative for dizziness.       Burning sensation to left breast and chest wall  Hematological: Negative.   Psychiatric/Behavioral:  Negative for confusion, hallucinations, sleep disturbance and suicidal ideas.   All other systems reviewed and are negative.       Objective:  BP 138/75   Pulse 67   Temp (!) 97.5 F (36.4 C) (Temporal)   Ht $R'5\' 3"'tt$  (1.6 m)   Wt 149 lb 12.8 oz (67.9 kg)   SpO2 99%   BMI 26.54 kg/m    Wt Readings from Last 3 Encounters:  11/25/21 149 lb 12.8 oz (67.9 kg)  11/24/21 150 lb (68 kg)  11/11/21 151 lb (68.5 kg)    Physical Exam Vitals and nursing note reviewed.  Constitutional:      General: She is not in acute distress.    Appearance: Normal appearance. She is not ill-appearing, toxic-appearing or diaphoretic.  HENT:     Head:  Normocephalic and atraumatic.     Mouth/Throat:  Mouth: Mucous membranes are moist.  Eyes:     Pupils: Pupils are equal, round, and reactive to light.  Cardiovascular:     Rate and Rhythm: Normal rate and regular rhythm.  Pulmonary:     Effort: Pulmonary effort is normal.     Breath sounds: Normal breath sounds.  Chest:     Chest wall: No mass, lacerations, deformity, swelling, tenderness, crepitus or edema. There is no dullness to percussion.  Skin:    General: Skin is warm and dry.     Capillary Refill: Capillary refill takes less than 2 seconds.     Findings: No rash.  Neurological:     General: No focal deficit present.     Mental Status: She is alert and oriented to person, place, and time.     Cranial Nerves: No cranial nerve deficit.     Sensory: No sensory deficit.     Motor: No weakness.     Coordination: Coordination normal.     Gait: Gait abnormal (using cane).     Deep Tendon Reflexes: Reflexes normal.  Psychiatric:        Mood and Affect: Mood normal.        Behavior: Behavior normal.        Thought Content: Thought content normal.        Judgment: Judgment normal.     Results for orders placed or performed in visit on 11/11/21  CBC with Differential/Platelet  Result Value Ref Range   WBC 4.9 3.4 - 10.8 x10E3/uL   RBC 3.74 (L) 3.77 - 5.28 x10E6/uL   Hemoglobin 11.1 11.1 - 15.9 g/dL   Hematocrit 34.7 34.0 - 46.6 %   MCV 93 79 - 97 fL   MCH 29.7 26.6 - 33.0 pg   MCHC 32.0 31.5 - 35.7 g/dL   RDW 12.5 11.7 - 15.4 %   Platelets 412 150 - 450 x10E3/uL   Neutrophils 60 Not Estab. %   Lymphs 31 Not Estab. %   Monocytes 7 Not Estab. %   Eos 1 Not Estab. %   Basos 1 Not Estab. %   Neutrophils Absolute 2.9 1.4 - 7.0 x10E3/uL   Lymphocytes Absolute 1.5 0.7 - 3.1 x10E3/uL   Monocytes Absolute 0.3 0.1 - 0.9 x10E3/uL   EOS (ABSOLUTE) 0.1 0.0 - 0.4 x10E3/uL   Basophils Absolute 0.0 0.0 - 0.2 x10E3/uL   Immature Granulocytes 0 Not Estab. %   Immature Grans (Abs)  0.0 0.0 - 0.1 x10E3/uL  CMP14+EGFR  Result Value Ref Range   Glucose 105 (H) 70 - 99 mg/dL   BUN 9 8 - 27 mg/dL   Creatinine, Ser 0.76 0.57 - 1.00 mg/dL   eGFR 80 >59 mL/min/1.73   BUN/Creatinine Ratio 12 12 - 28   Sodium 140 134 - 144 mmol/L   Potassium 4.3 3.5 - 5.2 mmol/L   Chloride 102 96 - 106 mmol/L   CO2 24 20 - 29 mmol/L   Calcium 9.6 8.7 - 10.3 mg/dL   Total Protein 6.7 6.0 - 8.5 g/dL   Albumin 4.1 3.8 - 4.8 g/dL   Globulin, Total 2.6 1.5 - 4.5 g/dL   Albumin/Globulin Ratio 1.6 1.2 - 2.2   Bilirubin Total 0.4 0.0 - 1.2 mg/dL   Alkaline Phosphatase 96 44 - 121 IU/L   AST 14 0 - 40 IU/L   ALT 7 0 - 32 IU/L  Thyroid Panel With TSH  Result Value Ref Range   TSH 0.049 (L) 0.450 - 4.500 uIU/mL  T4, Total 7.6 4.5 - 12.0 ug/dL   T3 Uptake Ratio 27 24 - 39 %   Free Thyroxine Index 2.1 1.2 - 4.9  VITAMIN D 25 Hydroxy (Vit-D Deficiency, Fractures)  Result Value Ref Range   Vit D, 25-Hydroxy 28.7 (L) 30.0 - 100.0 ng/mL   *Note: Due to a large number of results and/or encounters for the requested time period, some results have not been displayed. A complete set of results can be found in Results Review.       Pertinent labs & imaging results that were available during my care of the patient were reviewed by me and considered in my medical decision making.  Assessment & Plan:  Oval was seen today for hypertension.  Diagnoses and all orders for this visit:  Malaise and fatigue Low blood pressure reading Improved greatly since lowering Imdur dosing to 60 mg daily.  Neuritis Left breast burning sensation. Seen by oncology yesterday and recurrent malignancy ruled out. Likely neuritis as pt has had shingles twice in the past. Will burst with steroids to see if beneficial. Report new, worsening, or persistent symptoms.  -     predniSONE (DELTASONE) 20 MG tablet; 2 po at sametime daily for 5 days- start tomorrow     Continue all other maintenance medications.  Follow up  plan: Return if symptoms worsen or fail to improve.   Continue healthy lifestyle choices, including diet (rich in fruits, vegetables, and lean proteins, and low in salt and simple carbohydrates) and exercise (at least 30 minutes of moderate physical activity daily).   The above assessment and management plan was discussed with the patient. The patient verbalized understanding of and has agreed to the management plan. Patient is aware to call the clinic if they develop any new symptoms or if symptoms persist or worsen. Patient is aware when to return to the clinic for a follow-up visit. Patient educated on when it is appropriate to go to the emergency department.   Monia Pouch, FNP-C [facility] [phone]

## 2021-12-05 ENCOUNTER — Telehealth: Payer: Self-pay | Admitting: *Deleted

## 2021-12-05 NOTE — Telephone Encounter (Signed)
Per 11/24/21 los - No new follow up needed at this time -[place]

## 2021-12-06 ENCOUNTER — Other Ambulatory Visit: Payer: Self-pay | Admitting: Family Medicine

## 2021-12-06 DIAGNOSIS — I10 Essential (primary) hypertension: Secondary | ICD-10-CM

## 2021-12-08 ENCOUNTER — Ambulatory Visit: Admission: RE | Admit: 2021-12-08 | Payer: Medicare Other | Source: Ambulatory Visit

## 2021-12-08 ENCOUNTER — Ambulatory Visit
Admission: RE | Admit: 2021-12-08 | Discharge: 2021-12-08 | Disposition: A | Discharge status: Home / Self Care | Payer: Medicare Other | Source: Ambulatory Visit | Attending: Medical Oncology | Admitting: Medical Oncology

## 2021-12-08 ENCOUNTER — Other Ambulatory Visit: Payer: Self-pay | Admitting: Medical Oncology

## 2021-12-08 DIAGNOSIS — N644 Mastodynia: Secondary | ICD-10-CM

## 2021-12-08 DIAGNOSIS — C801 Malignant (primary) neoplasm, unspecified: Secondary | ICD-10-CM

## 2021-12-13 ENCOUNTER — Telehealth: Payer: Self-pay

## 2021-12-13 NOTE — Telephone Encounter (Signed)
-----   Message from Amy Black, [place] sent at 12/12/2021  4:41 PM EST ----- You imaging study does not show any area of concern. If the area changes at all (exception would be if it resolves) please let me know and we will repeat imaging

## 2021-12-13 NOTE — Telephone Encounter (Signed)
Called patient and Informed her of Mamm results and Sarah's recommendations. Patient verbalized understanding.

## 2021-12-19 ENCOUNTER — Ambulatory Visit (INDEPENDENT_AMBULATORY_CARE_PROVIDER_SITE_OTHER): Payer: Medicare Other | Admitting: Nurse Practitioner

## 2021-12-19 ENCOUNTER — Encounter: Payer: Self-pay | Admitting: Nurse Practitioner

## 2021-12-19 VITALS — BP 142/76 | HR 88 | Temp 98.7°F | Ht 63.0 in | Wt 153.9 lb

## 2021-12-19 DIAGNOSIS — H6501 Acute serous otitis media, right ear: Secondary | ICD-10-CM

## 2021-12-19 MED ORDER — AMOXICILLIN-POT CLAVULANATE 875-125 MG PO TABS: 1.0000 | ORAL_TABLET | Freq: Two times a day (BID) | ORAL | 0 refills | Status: DC

## 2021-12-19 NOTE — Patient Instructions (Signed)
Otitis Media, Adult  Otitis media is a condition in which the middle ear is red and swollen (inflamed) and full of fluid. The middle ear is the part of the ear that contains bones for hearing as well as air that helps send sounds to the brain. The condition usually goes away on its own. What are the causes? This condition is caused by a blockage in the eustachian tube. This tube connects the middle ear to the back of the nose. It normally allows air into the middle ear. The blockage is caused by fluid or swelling. Problems that can cause blockage include: A cold or infection that affects the nose, mouth, or throat. Allergies. An irritant, such as tobacco smoke. Adenoids that have become large. The adenoids are soft tissue located in the back of the throat, behind the nose and the roof of the mouth. Growth or swelling in the upper part of the throat, just behind the nose (nasopharynx). Damage to the ear caused by a change in pressure. This is called barotrauma. What increases the risk? You are more likely to develop this condition if you: Smoke or are exposed to tobacco smoke. Have an opening in the roof of your mouth (cleft palate). Have acid reflux. Have problems in your body's defense system (immune system). What are the signs or symptoms? Symptoms of this condition include: Ear pain. Fever. Problems with hearing. Being tired. Fluid leaking from the ear. Ringing in the ear. How is this treated? This condition can go away on its own within 3-5 days. But if the condition is caused by germs (bacteria) and does not go away on its own, or if it keeps coming back, your doctor may: Give you antibiotic medicines. Give you medicines for pain. Follow these instructions at home: Take over-the-counter and prescription medicines only as told by your doctor. If you were prescribed an antibiotic medicine, take it as told by your doctor. Do not stop taking it even if you start to feel better. Keep  all follow-up visits. Contact a doctor if: You have bleeding from your nose. There is a lump on your neck. You are not feeling better in 5 days. You feel worse instead of better. Get help right away if: You have pain that is not helped with medicine. You have swelling, redness, or pain around your ear. You get a stiff neck. You cannot move part of your face (paralysis). You notice that the bone behind your ear hurts when you touch it. You get a very bad headache. Summary Otitis media means that the middle ear is red, swollen, and full of fluid. This condition usually goes away on its own. If the problem does not go away, treatment may be needed. You may be given medicines to treat the infection or to treat your pain. If you were prescribed an antibiotic medicine, take it as told by your doctor. Do not stop taking it even if you start to feel better. Keep all follow-up visits. This information is not intended to replace advice given to you by your health care provider. Make sure you discuss any questions you have with your health care provider. Document Revised: 05/03/2020 Document Reviewed: 05/03/2020 Elsevier Patient Education  2023 [employer] Inc[employer]  

## 2021-12-19 NOTE — Progress Notes (Signed)
Acute Office Visit  Subjective:     Patient ID: Amy Black, female    DOB: [birth year], 81 y.o.   MRN: [id number]  Chief Complaint  Patient presents with   Ear Pain    Right ear - started last night     Otalgia  There is pain in the right ear. This is a new problem. The current episode started yesterday. The problem occurs constantly. The problem has been gradually worsening. There has been no fever. The pain is at a severity of 5/10. The pain is moderate. Associated symptoms include neck pain. Pertinent negatives include no coughing, hearing loss, rash or sore throat. She has tried acetaminophen for the symptoms. The treatment provided no relief.     Review of Systems  Constitutional: Negative.  Negative for chills, fever and malaise/fatigue.  HENT:  Positive for ear pain. Negative for hearing loss and sore throat.   Eyes: Negative.   Respiratory:  Negative for cough.   Cardiovascular: Negative.   Musculoskeletal:  Positive for neck pain.  Skin: Negative.  Negative for itching and rash.  All other systems reviewed and are negative.       Objective:    BP (!) 142/76   Pulse 88   Temp 98.7 F (37.1 C)   Ht '5\' 3"'$  (1.6 m)   Wt 153 lb 14.4 oz (69.8 kg)   SpO2 96%   BMI 27.26 kg/m  BP Readings from Last 3 Encounters:  12/19/21 (!) 142/76  11/25/21 138/75  11/24/21 139/67   Wt Readings from Last 3 Encounters:  12/19/21 153 lb 14.4 oz (69.8 kg)  11/25/21 149 lb 12.8 oz (67.9 kg)  11/24/21 150 lb (68 kg)      Physical Exam Vitals and nursing note reviewed.  Constitutional:      Appearance: Normal appearance.  HENT:     Head: Normocephalic.     Right Ear: External ear normal.     Left Ear: External ear normal.     Nose: Nose normal.     Mouth/Throat:     Mouth: Mucous membranes are moist.     Pharynx: Oropharynx is clear.  Eyes:     Conjunctiva/sclera: Conjunctivae normal.  Cardiovascular:     Rate and Rhythm: Normal rate and regular rhythm.      Pulses: Normal pulses.     Heart sounds: Normal heart sounds.  Pulmonary:     Effort: Pulmonary effort is normal.     Breath sounds: Normal breath sounds.  Skin:    General: Skin is warm.     Findings: No erythema or rash.  Neurological:     Mental Status: She is alert.     No results found for any visits on 12/19/21.      Assessment & Plan:  .  Patient presents with ear pain, symptoms present for the past 24 to 48 hours.  Patient currently using Tylenol with mild therapeutic effect.  Patient denies fever, headache, ringing in the ear , flulike symptoms or visual changes. Advised patient to take medication as prescribed, Tylenol as needed for pain, Augmentin 875-125 mg tablet by mouth twice daily for 5 days. Follow-up with unresolved symptoms. Problem List Items Addressed This Visit   None Visit Diagnoses     Non-recurrent acute serous otitis media of right ear    -  Primary   Relevant Medications   amoxicillin-clavulanate (AUGMENTIN) 875-125 MG tablet       Meds ordered this encounter  Medications  amoxicillin-clavulanate (AUGMENTIN) 875-125 MG tablet    Sig: Take 1 tablet by mouth 2 (two) times daily.    Dispense:  10 tablet    Refill:  0    Order Specific Question:   Supervising Provider    Answer:   [name] [phone]    Return if symptoms worsen or fail to improve.  Ivy Lynn, NP

## 2021-12-22 ENCOUNTER — Ambulatory Visit: Payer: Medicare Other | Admitting: Family Medicine

## 2022-01-02 ENCOUNTER — Encounter: Payer: Self-pay | Admitting: Family Medicine

## 2022-01-02 ENCOUNTER — Ambulatory Visit (INDEPENDENT_AMBULATORY_CARE_PROVIDER_SITE_OTHER): Payer: Medicare Other | Admitting: Family Medicine

## 2022-01-02 VITALS — BP 110/64 | HR 77 | Temp 98.2°F | Ht 63.0 in | Wt 158.0 lb

## 2022-01-02 DIAGNOSIS — Z79899 Other long term (current) drug therapy: Secondary | ICD-10-CM | POA: Diagnosis not present

## 2022-01-02 DIAGNOSIS — M792 Neuralgia and neuritis, unspecified: Secondary | ICD-10-CM | POA: Diagnosis not present

## 2022-01-02 DIAGNOSIS — G373 Acute transverse myelitis in demyelinating disease of central nervous system: Secondary | ICD-10-CM

## 2022-01-02 DIAGNOSIS — F419 Anxiety disorder, unspecified: Secondary | ICD-10-CM

## 2022-01-02 DIAGNOSIS — I10 Essential (primary) hypertension: Secondary | ICD-10-CM | POA: Diagnosis not present

## 2022-01-02 DIAGNOSIS — H6501 Acute serous otitis media, right ear: Secondary | ICD-10-CM | POA: Diagnosis not present

## 2022-01-02 MED ORDER — ALPRAZOLAM 0.5 MG PO TABS: 0.5000 mg | ORAL_TABLET | Freq: Three times a day (TID) | ORAL | 5 refills | Status: AC

## 2022-01-02 MED ORDER — VERAPAMIL HCL ER 120 MG PO TBCR: 120.0000 mg | EXTENDED_RELEASE_TABLET | Freq: Every day | ORAL | 3 refills | Status: DC

## 2022-01-02 MED ORDER — TRIAMTERENE-HCTZ 37.5-25 MG PO CAPS: ORAL_CAPSULE | ORAL | 3 refills | Status: DC

## 2022-01-02 MED ORDER — CYCLOBENZAPRINE HCL 5 MG PO TABS: ORAL_TABLET | ORAL | 5 refills | Status: DC

## 2022-01-02 MED ORDER — TRAMADOL HCL 50 MG PO TABS: ORAL_TABLET | ORAL | 1 refills | Status: DC

## 2022-01-02 NOTE — Progress Notes (Signed)
Subjective:  Patient ID: Amy Black, female    DOB: 12-10-1940  Age: 81 y.o. MRN: [id number]  CC: Medical Management of Chronic Issues   HPI Amy Black presents for some mild sore throat and follow up of her anxiety and chronic pain related to hx of transverse myelitis   presents for  follow-up of hypertension. Patient has no history of headache chest pain or shortness of breath or recent cough. Patient also denies symptoms of TIA such as focal numbness or weakness. Patient denies side effects from medication. States taking it regularly.      01/02/2022    1:16 PM 12/19/2021   12:18 PM 11/11/2021    2:16 PM  Depression screen PHQ 2/9  Decreased Interest 0 0 0  Down, Depressed, Hopeless 0 0 0  PHQ - 2 Score 0 0 0  Altered sleeping  0 0  Tired, decreased energy  0 1  Change in appetite  0 0  Feeling bad or failure about yourself   0 0  Trouble concentrating  0 0  Moving slowly or fidgety/restless  0 0  Suicidal thoughts  0 0  PHQ-9 Score  0 1  Difficult doing work/chores  Not difficult at all     History Amy Black has a past medical history of Adenocarcinoma ([place]), Adenosquamous carcinoma, Allergy, Anxiety, CAD (coronary artery disease), Cataract, Coronary artery spasm ([place]), Diverticulosis of colon (without mention of hemorrhage), Femoral neck fracture, right, closed, initial encounter (10/04/2015), GERD (gastroesophageal reflux disease), hepatic cyst, History of nuclear stress test (04/2011), HTN (hypertension), Hyperlipidemia, Insomnia, Irritable bowel syndrome, Transverse myelitis ([place]), and Vitamin D deficiency.   She has a past surgical history that includes Resection soft tissue tumor leg / ankle radical (2004); Diagnostic laparoscopy; Lysis of adhesion; Salpingoophorectomy (Left); Pelvic laparoscopy (1992); Cardiac catheterization; transthoracic echocardiogram (07/2012); Cardiac catheterization (N/A, 08/28/2014); Colonoscopy; Upper gastrointestinal endoscopy; Total hip  arthroplasty (Right, 10/04/2015); and Abdominal hysterectomy (Bilateral).   Her family history includes Bladder Cancer in her mother; Coronary artery disease in an other family member; Depression in her sister; Diabetes in her brother and another family member; Heart disease in her father and mother; Hyperlipidemia in her sister; Hypertension in her brother and mother; Seizures in her brother; Stroke in her father.She reports that she quit smoking about 49 years ago. Her smoking use included cigarettes. She started smoking about 53 years ago. She has a 2.00 pack-year smoking history. She has never used smokeless tobacco. She reports that she does not drink alcohol and does not use drugs.    ROS Review of Systems  Constitutional: Negative.   HENT: Negative.    Eyes:  Negative for visual disturbance.  Respiratory:  Negative for shortness of breath.   Cardiovascular:  Positive for chest pain (occasional. Prompt relief with NG SL. Has upcoming appt. with cardiologist).  Gastrointestinal:  Negative for abdominal pain.  Musculoskeletal:  Negative for arthralgias.    Objective:  BP 110/64   Pulse 77   Temp 98.2 F (36.8 C)   Ht '5\' 3"'$  (1.6 m)   Wt 158 lb (71.7 kg)   SpO2 99%   BMI 27.99 kg/m   BP Readings from Last 3 Encounters:  01/02/22 110/64  12/19/21 (!) 142/76  11/25/21 138/75    Wt Readings from Last 3 Encounters:  01/02/22 158 lb (71.7 kg)  12/19/21 153 lb 14.4 oz (69.8 kg)  11/25/21 149 lb 12.8 oz (67.9 kg)     Physical Exam Constitutional:  General: She is not in acute distress.    Appearance: She is well-developed.  HENT:     Head: Normocephalic and atraumatic.  Eyes:     Conjunctiva/sclera: Conjunctivae normal.     Pupils: Pupils are equal, round, and reactive to light.  Neck:     Thyroid: No thyromegaly.  Cardiovascular:     Rate and Rhythm: Normal rate and regular rhythm.     Heart sounds: Normal heart sounds. No murmur heard. Pulmonary:     Effort:  Pulmonary effort is normal. No respiratory distress.     Breath sounds: Normal breath sounds. No wheezing or rales.  Abdominal:     General: Bowel sounds are normal. There is no distension.     Palpations: Abdomen is soft.     Tenderness: There is no abdominal tenderness.  Musculoskeletal:        General: Normal range of motion.     Cervical back: Normal range of motion and neck supple.  Lymphadenopathy:     Cervical: No cervical adenopathy.  Skin:    General: Skin is warm and dry.  Neurological:     Mental Status: She is alert and oriented to person, place, and time.  Psychiatric:        Behavior: Behavior normal.        Thought Content: Thought content normal.        Judgment: Judgment normal.       Assessment & Plan:   Amy Black was seen today for medical management of chronic issues.  Diagnoses and all orders for this visit:  Controlled substance agreement signed -     ToxASSURE Select 13 (MW), Urine  Non-recurrent acute serous otitis media of right ear  Neuritis  Essential hypertension -     triamterene-hydrochlorothiazide (DYAZIDE) 37.5-25 MG capsule; TAKE ONE CAPSULE BY MOUTH 3 TIMES PER WEEK -     verapamil (CALAN-SR) 120 MG CR tablet; Take 1 tablet (120 mg total) by mouth at bedtime.  Anxiety -     ALPRAZolam (XANAX) 0.5 MG tablet; Take 1 tablet (0.5 mg total) by mouth 3 (three) times daily.  Transverse myelitis (H[facility]) -     traMADol (ULTRAM) 50 MG tablet; TAKE ONE TABLET BY MOUTH EVERY TWELVE HOURS AS NEEDED.  Other orders -     cyclobenzaprine (FLEXERIL) 5 MG tablet; TAKE ONE TABLET BY MOUTH THREE TIMES DAILY AS NEEDED FOR MUSCLE SPASMS.       I have discontinued Amy Black. [name]'s predniSONE and amoxicillin-clavulanate. I am also having her maintain her Calcium Citrate-Vitamin D, fluticasone, Fusion Plus, Hyoscyamine Sulfate SL, nitroGLYCERIN, hydrOXYzine, Allergy Relief, lidocaine, meloxicam, mometasone, isosorbide mononitrate,  triamterene-hydrochlorothiazide, ALPRAZolam, cyclobenzaprine, traMADol, and verapamil.  Allergies as of 01/02/2022       Reactions   Iodine Itching   Ivp Dye [iodinated Contrast Media] Itching   Amlodipine Other (See Comments)   headaches   Lexapro [escitalopram Oxalate] Other (See Comments)   Drunk, High feeling   Cymbalta [duloxetine Hcl] Other (See Comments)   sleepiness   Gabapentin Other (See Comments)   sleepiness   Zanaflex [tizanidine Hcl] Other (See Comments)   Very drunk feeling next day        Medication List        Accurate as of January 02, 2022  1:54 PM. If you have any questions, ask your nurse or doctor.          STOP taking these medications    amoxicillin-clavulanate 875-125 MG tablet Commonly known as: AUGMENTIN  Stopped by: [name], MD   predniSONE 20 MG tablet Commonly known as: DELTASONE Stopped by: [name], MD       TAKE these medications    Allergy Relief 10 MG tablet Generic drug: loratadine TAKE ONE TABLET BY MOUTH DAILY   ALPRAZolam 0.5 MG tablet Commonly known as: XANAX Take 1 tablet (0.5 mg total) by mouth 3 (three) times daily.   Calcium Citrate-Vitamin D 315-250 MG-UNIT Tabs Commonly known as: Calcium Citrate + D3 Take 2 tablets by mouth daily.   cyclobenzaprine 5 MG tablet Commonly known as: FLEXERIL TAKE ONE TABLET BY MOUTH THREE TIMES DAILY AS NEEDED FOR MUSCLE SPASMS.   fluticasone 50 MCG/ACT nasal spray Commonly known as: FLONASE Place 2 sprays into both nostrils daily.   Fusion Plus Caps Take 1 capsule by mouth daily.   hydrOXYzine 10 MG tablet Commonly known as: ATARAX TAKE 1 TABLET TWICE DAILY.   Hyoscyamine Sulfate SL 0.125 MG Subl Commonly known as: Levsin/SL Place 0.125 mg under the tongue every 4 (four) hours as needed (abd cramping and diarrhea).   isosorbide mononitrate 60 MG 24 hr tablet Commonly known as: IMDUR Take 1 tablet (60 mg total) by mouth daily.   lidocaine 5 %  ointment Commonly known as: XYLOCAINE Apply 1 application. topically as needed.   meloxicam 7.5 MG tablet Commonly known as: MOBIC Take 7.5 mg by mouth daily.   mometasone 0.1 % cream Commonly known as: Elocon Apply 1 Application topically daily. To affected eye brow area   nitroGLYCERIN 0.4 MG SL tablet Commonly known as: NITROSTAT DISSOLVE ONE TABLET UNDER TONGUE EVERY 5 MINUTES UP TO 3 DOSES AS NEEDED FOR CHEST PAIN   traMADol 50 MG tablet Commonly known as: ULTRAM TAKE ONE TABLET BY MOUTH EVERY TWELVE HOURS AS NEEDED.   triamterene-hydrochlorothiazide 37.5-25 MG capsule Commonly known as: DYAZIDE TAKE ONE CAPSULE BY MOUTH 3 TIMES PER WEEK   verapamil 120 MG CR tablet Commonly known as: CALAN-SR Take 1 tablet (120 mg total) by mouth at bedtime.         Follow-up: Return in about 6 months (around 07/03/2022).  [name], M.D.

## 2022-01-05 DIAGNOSIS — H04121 Dry eye syndrome of right lacrimal gland: Secondary | ICD-10-CM | POA: Diagnosis not present

## 2022-01-05 DIAGNOSIS — H5711 Ocular pain, right eye: Secondary | ICD-10-CM | POA: Diagnosis not present

## 2022-01-05 LAB — TOXASSURE SELECT 13 (MW), URINE

## 2022-01-08 NOTE — Progress Notes (Signed)
Hello Arlee,  Your lab result is normal and/or stable.Some minor variations that are not significant are commonly marked abnormal, but do not represent any medical problem for you.  Best regards, [name], M.D.

## 2022-01-12 NOTE — Progress Notes (Deleted)
NEUROLOGY FOLLOW UP OFFICE NOTE  Amy Black [id number]  Subjective:  Amy Black is a 81 y.o. 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  To briefly review: Patient states she is here today for headache. The headache is located in the neck into the back of the head, front of head, and top of her head. It is a pulsating sensation. She states it is a tightness in the muscles of the neck that go into the head at the start. She has the pain twice per week. She thinks it takes about 1 minute to reach maximal intensity. It can be very intense though she has difficulty assigning a number to the intensity. On average it lasts about 1 minute and then resolves on its own. There is no associated face pain, but sometimes feels like it is around her eyes. She denies eye watering. She denies photophobia, phonophobia, nausea, or vomiting. Her eye doctor told her she had dry eyes and gave her drops. She denies jaw claudication but does have some tenderness in right temple area.   She also endorses dizziness. She is sitting in a chair and suddenly she felt like her head was spinning. The room was not spinning. It resolved in less than 1 minute. She denies quick head or body turns as she was sitting still. Over the last 6 months, she estimates she has had about 8 episodes that are all about the same. There is no clear trigger and there is nothing that she has to do to make it stop.   She denies new medications.   Patient was seen by her PCP, Dr. Livia Black, at [facility] on 07/11/21 for fatigue and weakness in the legs. Per patient, this is not a major concern that she would like to talk about. Per patient, she has difficulty with her  right leg, but this is not anything new. She denies any recent falls. She walks with the assistance of a walker.   Patient was previously seen in this office by Dr. Tomi Black in 2015. Per the office visit from 12/22/2013: On 02/05/12, she developed strange sensation in her right leg.  Over the next few days, she developed a progressive flaccid paralysis of the right leg and urinary retention.  She presented to [facility] on 02/10/12.  MRI of the thoracic spine with and without contrast (02/10/12) revealed non-expansile signal abnormality in the right hemicord at T11-T12 with associated patchy enhancement and sparing of the conus medullaris.  MRI of the brain without contrast (02/12/12) revealed chronic small vessel ischemic changes involving the pons and cerebral white matter.  MRI of the lumbar spine with contrast (02/10/12) revealed no acute abnormality.  MRI of the cervical spine without contrast (02/12/12) was unremarkable except for minor non-compressive disc bulges.  MRI of the pelvis with and without contrast (02/11/12) was unremarkable.  CBC and CMP were unremarkable.  ESR was 23.  She had refused LP.  She was discharged to inpatient rehab.   She was evaluated by Dr. Krista Black at [facility].  She underwent visual evoked potentials, which demonstrated prolonged latency bilaterally, left worse than right.  She underwent further laboratory testing, which revealed positive ANA and positive RNP.  Lupus anticoagulant, TSH, B12, B6, protein electrophoresis,  Lyme and ACE were unremarkable.  Vitamin D was low at 18.6.     She was referred to [facility], where she was evaluated by Dr. Diamantina Black.  She underwent repeat MR imaging.  The thoracic and lumbar MRIs were read as unremarkable.  MRA was performed, which revealed no evidence of AV dural fistulas.  She underwent a lumbar puncture, which revealed normal CSF.   She underwent repeat MRI of the thoracic spine with and without contrast on 02/05/13, which revealed  that the previous patchy hyperintensity right hemicord had normalized.  NMO antibodies were negative.   She experienced ongoing right leg pain at night, for which she was prescribed oxcarbazepine 372m.  This was subsequently changed to gabapentin 1073mat bedtime, which makes her groggy.  Cymbalta made her not feel well.  She was then started on Lyrica 7514mwice daily.  She also take tizanidine.    The patient has not noticed any recent skin rashes nor does she report any constitutional symptoms like fever, night sweats, anorexia or unintentional weight loss.   EtOH use: none  Restrictive diet? no Family history of neurologic disease? No  Most recent Assessment and Plan (10/13/21): Patient's symptoms are most consistent with cervicalgia causing headaches and intermittent dizziness. Her description of the headaches do have a few red flags though. First, she is not typically a headachy person, so new onset headaches at this age is a red flag. She also states that the headaches and dizziness have abrupt intense onset with maximal symptoms in < 1 minute. Finally, she has temporal pain, but without jaw claudication or vision changes. While her headaches do not last long and self resolve, I would like to ensure there is no more sinister etiology given these red flags.   Patient was referred for weakness and fatigue by Dr. StaLivia Snellenut patient now states this is not a major issue and that her weakness is chronic. She declined further work up for these issues today.   PLAN: -Blood work: ESR, CRP -Cervical xray -MRI/MRA brain w/wo contrast -Physical therapy for neck pain and dizziness  Since their last visit: ***  Lab work was unremarkable. MRI/MRA of brain showed chronic microvascular ischemia but no pathology to explain headaches.  MEDICATIONS:  Outpatient Encounter Medications as of 01/18/2022  Medication Sig   ALLERGY RELIEF 10 MG tablet TAKE ONE TABLET BY MOUTH DAILY   ALPRAZolam (XANAX) 0.5  MG tablet Take 1 tablet (0.5 mg total) by mouth 3 (three) times daily.   Calcium Citrate-Vitamin D (CALCIUM CITRATE + D3) 315-250 MG-UNIT TABS Take 2 tablets by mouth daily.   cyclobenzaprine (FLEXERIL) 5 MG tablet TAKE ONE TABLET BY MOUTH THREE TIMES DAILY AS NEEDED FOR MUSCLE SPASMS.   fluticasone (FLONASE) 50 MCG/ACT nasal spray Place 2 sprays into both nostrils daily.   hydrOXYzine (ATARAX) 10 MG tablet TAKE 1 TABLET TWICE DAILY.   Hyoscyamine Sulfate SL (LEVSIN/SL) 0.125 MG SUBL Place 0.125 mg under the tongue every 4 (four) hours as needed (abd cramping and diarrhea).   Iron-FA-B Cmp-C-Biot-Probiotic (FUSION PLUS) CAPS Take 1 capsule by mouth daily.   isosorbide mononitrate (IMDUR) 60 MG 24 hr tablet Take 1 tablet (60 mg total) by mouth daily.   lidocaine (XYLOCAINE) 5 % ointment Apply 1 application. topically as needed.   meloxicam (MOBIC) 7.5 MG tablet Take 7.5 mg by mouth daily.   mometasone (ELOCON) 0.1 % cream Apply 1 Application topically daily. To affected eye brow area   nitroGLYCERIN (NITROSTAT) 0.4 MG SL  tablet DISSOLVE ONE TABLET UNDER TONGUE EVERY 5 MINUTES UP TO 3 DOSES AS NEEDED FOR CHEST PAIN   traMADol (ULTRAM) 50 MG tablet TAKE ONE TABLET BY MOUTH EVERY TWELVE HOURS AS NEEDED.   triamterene-hydrochlorothiazide (DYAZIDE) 37.5-25 MG capsule TAKE ONE CAPSULE BY MOUTH 3 TIMES PER WEEK   verapamil (CALAN-SR) 120 MG CR tablet Take 1 tablet (120 mg total) by mouth at bedtime.   No facility-administered encounter medications on file as of 01/18/2022.    PAST MEDICAL HISTORY: Past Medical History:  Diagnosis Date   Adenocarcinoma ([place])    LEFT LEG   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 minimal CAD by cath in 2016 with presumed spasm (20% mLAD, 20% D2).   Cataract    bilateral cataracts removed   Coronary artery spasm ([facility])    Diverticulosis of colon (without mention of hemorrhage)     Femoral neck fracture, right, closed, initial encounter 10/04/2015   GERD (gastroesophageal reflux disease)    hepatic cyst    History of nuclear stress test 04/2011   lexiscan; normal study, no significant ischemia, low risk; 2015 - normal   HTN (hypertension)    PT. DENIES   Hyperlipidemia    Insomnia    Irritable bowel syndrome    Transverse myelitis ([facility])    Vitamin D deficiency     PAST SURGICAL HISTORY: Past Surgical History:  Procedure Laterality Date   ABDOMINAL HYSTERECTOMY Bilateral    CARDIAC CATHETERIZATION     coronary spasm   CARDIAC CATHETERIZATION N/A 08/28/2014   Procedure: Left Heart Cath and Coronary Angiography;  Surgeon: Jettie Booze, MD;  Location: [place] [facility];  Service: Cardiovascular;  Laterality: N/A;   COLONOSCOPY     DIAGNOSTIC LAPAROSCOPY     LYSIS OF ADHESION     PELVIC LAPAROSCOPY  1992   RSO, AND LSO ON 2007   RESECTION SOFT TISSUE TUMOR LEG / ANKLE RADICAL  2004   leg lesion resection & radiation   SALPINGOOPHORECTOMY Left    TOTAL HIP ARTHROPLASTY Right 10/04/2015   Procedure: TOTAL HIP ARTHROPLASTY ANTERIOR APPROACH;  Surgeon: Rod Can, MD;  Location: [facility];  Service: Orthopedics;  Laterality: Right;   TRANSTHORACIC ECHOCARDIOGRAM  07/2012   LV cavity size mildly reduced, normal wall motion; MV with calcified annulus and mild MR; LA mildly dilated; atrial septum with increased thickness - lipomatous hypertrophy; RV systolic pressure increased (borderline pulm HTN)   UPPER GASTROINTESTINAL ENDOSCOPY      ALLERGIES: Allergies  Allergen Reactions   Iodine Itching   Ivp Dye [Iodinated Contrast Media] Itching   Amlodipine Other (See Comments)    headaches   Lexapro [Escitalopram Oxalate] Other (See Comments)    Drunk, High feeling   Cymbalta [Duloxetine Hcl] Other (See Comments)    sleepiness   Gabapentin Other (See Comments)    sleepiness   Zanaflex [Tizanidine Hcl] Other (See Comments)    Very drunk feeling next day     FAMILY HISTORY: Family History  Problem Relation Age of Onset   Bladder Cancer Mother    Heart disease Mother    Hypertension Mother    Heart disease Father    Stroke Father    Hyperlipidemia Sister    Depression Sister    Diabetes Brother    Seizures Brother    Hypertension Brother    Diabetes Other  Multiple family members on both sides    Coronary artery disease Other        Multiple family members on both sides    Colon cancer Neg Hx    Esophageal cancer Neg Hx    Pancreatic cancer Neg Hx    Rectal cancer Neg Hx    Stomach cancer Neg Hx    Breast cancer Neg Hx     SOCIAL HISTORY: Social History   Tobacco Use   Smoking status: Former    Packs/day: 0.50    Years: 4.00    Total pack years: 2.00    Types: Cigarettes    Start date: 12/07/1968    Quit date: 11/20/1972    Years since quitting: 49.1   Smokeless tobacco: Never   Tobacco comments:    quit 40 years ago  Vaping Use   Vaping Use: Never used  Substance Use Topics   Alcohol use: No    Alcohol/week: 0.0 standard drinks of alcohol   Drug use: No   Social History   Social History Narrative   Disabled, lives with husband and daughter.  Enjoys shopping. Lives in two story home   1 son and 1 daughter.   1 grandson.   Caffeine 2 cans of soda and tea   Right handed      Objective:  Vital Signs:  There were no vitals taken for this visit.  ***  Labs and Imaging review: New results: 10/13/21: CRP < 1.0 ESR 28  MRI/MRA brain w/wo contrast (11/01/21): FINDINGS: MRI HEAD FINDINGS   Brain: Extensive hyperintensity throughout the cerebral white matter bilaterally with mild progression. Patchy hyperintensity in the basal ganglia bilaterally similar. Patchy hyperintensity throughout the pons with mild progression   Negative for acute infarct. Negative for hemorrhage or mass. Normal enhancement.   Vascular: Normal arterial flow voids   Skull and upper cervical spine: Negative    Sinuses/Orbits: Mild mucosal edema paranasal sinuses. Bilateral cataract extraction   Other: None   MRA HEAD FINDINGS   Anterior circulation: Internal carotid artery widely patent bilaterally. No stenosis or aneurysm. Anterior and middle cerebral arteries normal bilaterally   Posterior circulation: Normal posterior circulation without stenosis or aneurysm.   Anatomic variants: None   IMPRESSION: 1. Extensive chronic microvascular ischemia with progression since 2018. No acute infarct. 2. Negative MRA head.  Previously reviewed results: Internal labs: Normal or unremarkable: lipid panel, CMP, CBC, ferritin, TSH B12 (09/27/16): 354   MRI lumbar spine (05/29/21): FINDINGS: Segmentation:  Standard.   Alignment:  Trace degenerative anterolisthesis at L4-L5.   Vertebrae: No fracture, evidence of discitis, or aggressive bone lesion.   Conus medullaris and cauda equina: Conus extends to the L1 level. Conus and cauda equina appear normal.   Paraspinal and other soft tissues: Multiple right renal cysts.   Disc levels:   T11-T12: Asymmetric right disc bulging.  No significant stenosis.   T12-L1: Asymmetric right disc bulging.  No significant stenosis.   L1-L2: Minimal foraminal disc bulging. Mild bilateral facet arthropathy. No significant stenosis.   L2-L3: Right greater than left disc bulging, ligamentum flavum hypertrophy and mild facet arthropathy. There is mild spinal canal stenosis, moderate left and mild right neural foraminal stenosis. Contact with the exiting left L2 nerve root.   L3-L4: Broad-based disc bulging, ligamentum flavum hypertrophy mild facet arthropathy results and mild spinal canal stenosis, mild to moderate left and mild right neural foraminal stenosis. Contact with the exiting left L3 nerve root.   L4-L5: Trace anterolisthesis with  minimal disc bulging, ligamentum flavum hypertrophy and bilateral facet arthropathy results in mild spinal canal  stenosis, mild to moderate left and right neural foraminal stenosis. Contact with the exiting left L4 nerve root. Small right-sided perineural cyst.   L5-S1: No significant stenosis.  Bilateral perineural cysts at S1.   IMPRESSION: Multilevel degenerative changes of the lumbar spine, with mild spinal canal stenosis at L2-L3, L3-L4, and L4-L5.   Mild to moderate left-sided neural foraminal stenosis at L2-L3, L3-L4, and L4-L5, with contact with the exiting left sided nerve roots at these levels, which could potentially result in left-sided radiculopathy.   Mild right-sided neural foraminal stenosis at L2-L3 and L3-L4 and mild-to-moderate right-sided neural foraminal stenosis at L4-L5.   MRI brain (08/16/2016): FINDINGS: Brain: Diffusion-weighted images demonstrate no acute or subacute infarction. Extensive periventricular and subcortical T2 changes bilaterally demonstrates similar progression. Dilated perivascular spaces are present within the basal ganglia. Extensive white matter changes continue into the brainstem. Cerebellum is unremarkable. Internal auditory canals are within normal limits bilaterally.   Vascular: Flow is present in the major intracranial arteries.   Skull and upper cervical spine: The skullbase is within normal limits. The craniocervical junction is normal. Midline sagittal structures are unremarkable.   Sinuses/Orbits: A single right ethmoid air cell is opacified. The paranasal sinuses and mastoid air cells are otherwise clear. Bilateral lens replacements are present.   IMPRESSION: 1. Progressive diffuse white matter disease. This likely reflects the sequela of chronic microvascular ischemia, hypertension, or vasculitis. 2. No acute abnormality.   MRI cervical and thoracic spine (09/03/2015): FINDINGS: MRI CERVICAL SPINE FINDINGS Alignment: Physiologic. Vertebrae: No fracture, evidence of discitis, or bone lesion. Cord: Normal signal and  morphology. Posterior Fossa, vertebral arteries, paraspinal tissues: Negative. Disc levels: Discs: Disc desiccation throughout the cervical spine. Mild disc height loss at C5-6. C2-3: No significant disc bulge. No neural foraminal stenosis. No central canal stenosis. C3-4: Mild broad-based disc bulge. No neural foraminal stenosis. No central canal stenosis. C4-5: Mild broad-based disc bulge. No neural foraminal stenosis. No central canal stenosis. C5-6: Mild broad-based disc bulge. No neural foraminal stenosis. No central canal stenosis. C6-7: No significant disc bulge. No neural foraminal stenosis. No central canal stenosis. C7-T1: No significant disc bulge. No neural foraminal stenosis. No central canal stenosis. MRI THORACIC SPINE FINDINGS Segmentation:  Standard. Alignment:  Physiologic. Vertebrae:  No fracture, evidence of discitis, or bone lesion. Paraspinal and other soft tissues: Negative. Disc levels: Disc spaces:  Disc spaces are maintained. T1-T2: No disc protrusion, foraminal stenosis or central canal stenosis. T2-T3: No disc protrusion, foraminal stenosis or central canal stenosis. T3-T4: No disc protrusion, foraminal stenosis or central canal stenosis. T4-T5: No disc protrusion, foraminal stenosis or central canal stenosis. T5-T6: No disc protrusion, foraminal stenosis or central canal stenosis. T6-T7: No disc protrusion, foraminal stenosis or central canal stenosis. T7-T8: No disc protrusion, foraminal stenosis or central canal stenosis. T8-T9: No disc protrusion, foraminal stenosis or central canal stenosis. T9-T10: No disc protrusion, foraminal stenosis or central canal stenosis. T10-T11: No disc protrusion, foraminal stenosis or central canal stenosis. T11-T12: No disc protrusion, foraminal stenosis or central canal stenosis. IMPRESSION: CERVICAL SPINE 1. No significant disc protrusion, foraminal stenosis or central canal stenosis of the cervical  spine. No acute injury of the cervical spine. Normal cervical spinal cord. THORACIC SPINE 1. No significant disc protrusion, foraminal stenosis or central canal stenosis of the thoracic spine. No acute injury of the thoracic spine. Normal thoracic spinal cord.   EMG (12/24/13):  Assessment/Plan:  This is [employer], a 81 y.o. female with:  ***   Plan: ***  Return to clinic in ***  Total time spent reviewing records, interview, history/exam, documentation, and coordination of care on day of encounter:  *** min  Kai Levins, MD  CC: ***

## 2022-01-18 ENCOUNTER — Ambulatory Visit: Payer: Medicare Other | Admitting: Neurology

## 2022-02-03 ENCOUNTER — Encounter: Payer: Self-pay | Admitting: Family Medicine

## 2022-02-03 ENCOUNTER — Telehealth (INDEPENDENT_AMBULATORY_CARE_PROVIDER_SITE_OTHER): Payer: Medicare Other | Admitting: Family Medicine

## 2022-02-03 DIAGNOSIS — J069 Acute upper respiratory infection, unspecified: Secondary | ICD-10-CM | POA: Diagnosis not present

## 2022-02-03 MED ORDER — BENZONATATE 100 MG PO CAPS: 100.0000 mg | ORAL_CAPSULE | Freq: Three times a day (TID) | ORAL | 0 refills | Status: DC | PRN

## 2022-02-03 MED ORDER — FLUTICASONE PROPIONATE 50 MCG/ACT NA SUSP: 2.0000 | Freq: Every day | NASAL | 6 refills | Status: DC

## 2022-02-03 NOTE — Progress Notes (Signed)
   Virtual Visit  Note Due to COVID-19 pandemic this visit was conducted virtually. This visit type was conducted due to national recommendations for restrictions regarding the COVID-19 Pandemic (e.g. social distancing, sheltering in place) in an effort to limit this patient's exposure and mitigate transmission in our community. All issues noted in this document were discussed and addressed.  A physical exam was not performed with this format.  I connected with Amy Black on 02/03/22 at 1128 by telephone and verified that I am speaking with the correct person using two identifiers. Amy Black is currently located at home and no one is currently with her during the visit. The provider, Gwenlyn Perking, FNP is located in their office at time of visit.  I discussed the limitations, risks, security and privacy concerns of performing an evaluation and management service by telephone and the availability of in person appointments. I also discussed with the patient that there may be a patient responsible charge related to this service. The patient expressed understanding and agreed to proceed.  CC: cold symptoms  History and Present Illness:  URI  This is a new problem. Episode onset: 2 days. The problem has been gradually improving. There has been no fever. Associated symptoms include congestion, coughing, ear pain (right side), headaches, nausea, sneezing and a sore throat. Pertinent negatives include no abdominal pain, chest pain, diarrhea, vomiting or wheezing. She has tried antihistamine (mucinex) for the symptoms. The treatment provided no relief.      Review of Systems  HENT:  Positive for congestion, ear pain (right side), sneezing and sore throat.   Respiratory:  Positive for cough. Negative for wheezing.   Cardiovascular:  Negative for chest pain.  Gastrointestinal:  Positive for nausea. Negative for abdominal pain, diarrhea and vomiting.  Neurological:  Positive for headaches.      Observations/Objective: Discussed symptomatic care and return precautions.    Assessment and Plan: [name] was seen today for nasal congestion.  Diagnoses and all orders for this visit:  Viral URI with cough Discussed symptomatic care and return precautions.  -     fluticasone (FLONASE) 50 MCG/ACT nasal spray; Place 2 sprays into both nostrils daily. -     benzonatate (TESSALON PERLES) 100 MG capsule; Take 1 capsule (100 mg total) by mouth 3 (three) times daily as needed for cough.     Follow Up Instructions: As needed.     I discussed the assessment and treatment plan with the patient. The patient was provided an opportunity to ask questions and all were answered. The patient agreed with the plan and demonstrated an understanding of the instructions.   The patient was advised to call back or seek an in-person evaluation if the symptoms worsen or if the condition fails to improve as anticipated.  The above assessment and management plan was discussed with the patient. The patient verbalized understanding of and has agreed to the management plan. Patient is aware to call the clinic if symptoms persist or worsen. Patient is aware when to return to the clinic for a follow-up visit. Patient educated on when it is appropriate to go to the emergency department.   Time call ended:  1139  I provided 11  minutes of  non face-to-face time during this encounter.    Gwenlyn Perking, FNP

## 2022-02-06 DIAGNOSIS — Z20822 Contact with and (suspected) exposure to covid-19: Secondary | ICD-10-CM | POA: Diagnosis not present

## 2022-02-06 DIAGNOSIS — R059 Cough, unspecified: Secondary | ICD-10-CM | POA: Diagnosis not present

## 2022-02-21 ENCOUNTER — Ambulatory Visit (INDEPENDENT_AMBULATORY_CARE_PROVIDER_SITE_OTHER): Payer: Medicare Other | Admitting: Nurse Practitioner

## 2022-02-21 ENCOUNTER — Ambulatory Visit: Payer: Medicare Other | Admitting: Nurse Practitioner

## 2022-02-21 ENCOUNTER — Encounter: Payer: Self-pay | Admitting: Nurse Practitioner

## 2022-02-21 VITALS — BP 144/78 | HR 88 | Temp 96.9°F | Resp 20 | Ht 63.0 in | Wt 152.0 lb

## 2022-02-21 DIAGNOSIS — R0981 Nasal congestion: Secondary | ICD-10-CM | POA: Diagnosis not present

## 2022-02-21 MED ORDER — CETIRIZINE HCL 10 MG PO TABS: 10.0000 mg | ORAL_TABLET | Freq: Every day | ORAL | 11 refills | Status: DC

## 2022-02-21 NOTE — Patient Instructions (Signed)
Nonallergic Rhinitis Nonallergic rhinitis is inflammation of the mucous membrane inside the nose. The mucous membrane is the tissue that produces mucus. This condition is different from having allergic rhinitis, which is an allergy that affects the nose. Allergic rhinitis occurs when the body's defense system, or immune system, reacts to a substance that a person is allergic to (allergen), such as pollen, pet dander, mold, or dust. Nonallergic rhinitis has many similar symptoms, but it is not caused by allergens. Nonallergic rhinitis can be an acute or chronic problem. This means it can be short-term or long-term. What are the causes? This condition may be caused by many different things. Some common types of nonallergic rhinitis include: Infectious rhinitis. This is usually caused by an infection in the nose, throat, or upper airways (upper respiratory system). Vasomotor rhinitis. This is the most common type of chronic nonallergic rhinitis. It is caused by too much blood flow through your nose, and it leads to swelling in your nose. It is triggered by strong odors, cold air, stress, drinking alcohol, cigarette smoke, or changes in the weather. Occupational rhinitis. This type is caused by triggers in the workplace, such as chemicals, dust, animal dander, or air pollution. Hormonal rhinitis, in teenage girls and women. This type is caused by an increase in the hormone estrogen and may happen during pregnancy, puberty, or monthly menstrual periods. Hormonal rhinitis gives you fewer symptoms when estrogen levels drop. Drug-induced rhinitis. Several types of medicines can cause this, such as medicines for high blood pressure or heart disease, aspirin, or NSAIDs. Nonallergic rhinitis with eosinophilia syndrome (NARES). This type is caused by having too much eosinophil, a type of white blood cell. Other causes include a reaction to eating hot or spicy foods. This does not usually cause long-term symptoms. In  some cases, the cause of nonallergic rhinitis is not known. What increases the risk? You are more likely to develop this condition if: You are 3-53 years of age. You are a woman. Women are twice as likely to have this condition. What are the signs or symptoms? Common symptoms of this condition include: Stuffy nose (nasal congestion). Runny nose. A feeling of mucus dripping down the back of your throat (postnasal drip). Trouble sleeping. Tiredness, or fatigue. Other symptoms include: Sneezing. Coughing. Itchy nose. Bloodshot eyes. How is this diagnosed? This type may be diagnosed based on: Your symptoms and medical history. A physical exam. Allergy testing to rule out allergic rhinitis. You may have skin tests or blood tests. Your health care provider may also take a swab of nasal discharge to look for an increased number of eosinophils. This would be done to confirm a diagnosis of NARES. How is this treated? Treatment for this condition depends on the cause. No single treatment works for everyone. Work with your health care provider to find the best treatment for you. Treatment may include: Avoiding the things that trigger your symptoms. Medicines to relieve congestion, such as: Steroid nasal spray. There are many types. You may need to try a few to find out which one works best. Best boy medicine. This treats nasal congestion and may be given by mouth or as a nasal spray. These medicines are used only for a short time. Medicines to relieve a runny nose. These may include antihistamine medicines or anticholinergic nasal sprays. Nasal irrigation. This involves using a salt-water (saline) spray or saline container called a neti pot. Nasal irrigation helps to clear away mucus and keep your nasal passages moist. Surgery to remove part  of your mucous membrane. This is done in severe cases if the condition has not improved after 6-12 months of treatment. Follow these instructions at  home: Medicines Take or use over-the-counter and prescription medicines only as told by your health care provider. Do not stop using your medicine even if you start to feel better. Do not take NSAIDs, such as ibuprofen, or medicines that contain aspirin if they make your symptoms worse. Lifestyle Do not drink alcohol if it makes your symptoms worse. Do not use any products that contain nicotine or tobacco, such as cigarettes, e-cigarettes, and chewing tobacco. If you need help quitting, ask your health care provider. Avoid secondhand smoke. General instructions Avoid triggers that make your symptoms worse. Use nasal irrigation as told by your health care provider. Get exercise. Exercise may help reduce symptoms for some people. Sleep with the head of your bed raised. This may reduce nasal congestion when you sleep. Drink enough fluid to keep your urine pale yellow. Keep all follow-up visits as told by your health care provider. This is important. Contact a health care provider if: You have a fever. Your symptoms are getting worse at home. Your symptoms do not lessen with medicine. You develop new symptoms, especially a headache or nosebleed. Summary Nonallergic rhinitis is inflammation inside the nose that is not caused by allergens. Nonallergic rhinitis can be a short-term or long-term problem. Treatment may include avoiding the things that trigger your symptoms. Take or use over-the-counter and prescription medicines only as told by your health care provider. Do not stop using your medicine even if you start to feel better. Contact a health care provider if your symptoms do not lessen with medicine. This information is not intended to replace advice given to you by your health care provider. Make sure you discuss any questions you have with your health care provider. Document Revised: 07/07/2021 Document Reviewed: 12/02/2018 Elsevier Patient Education  [place].

## 2022-02-21 NOTE — Progress Notes (Signed)
Subjective:    Patient ID: Amy Black, female    DOB: Jan 01, 1941, 82 y.o.   MRN: [id number]   Chief Complaint: Cough   Had flu th efirst of January. Still has cough and congestion. Congestion is the worst  Cough This is a new problem. The current episode started 1 to 4 weeks ago. The problem has been waxing and waning. The problem occurs every few hours. The cough is Non-productive. Associated symptoms include rhinorrhea. Pertinent negatives include no ear congestion, ear pain, fever, sore throat or shortness of breath. Nothing aggravates the symptoms. She has tried nothing for the symptoms. The treatment provided no relief.       Review of Systems  Constitutional:  Negative for fever.  HENT:  Positive for rhinorrhea. Negative for ear pain and sore throat.   Respiratory:  Positive for cough. Negative for shortness of breath.        Objective:   Physical Exam Vitals and nursing note reviewed.  Constitutional:      General: She is not in acute distress.    Appearance: Normal appearance. She is well-developed.  HENT:     Head: Normocephalic.     Right Ear: Tympanic membrane normal. There is no impacted cerumen.     Left Ear: Tympanic membrane normal. There is no impacted cerumen.     Nose: Congestion and rhinorrhea present.     Mouth/Throat:     Mouth: Mucous membranes are moist.  Eyes:     Pupils: Pupils are equal, round, and reactive to light.  Neck:     Vascular: No carotid bruit or JVD.  Cardiovascular:     Rate and Rhythm: Normal rate and regular rhythm.     Heart sounds: Normal heart sounds.  Pulmonary:     Effort: Pulmonary effort is normal. No respiratory distress.     Breath sounds: Normal breath sounds. No wheezing or rales.  Chest:     Chest wall: No tenderness.  Abdominal:     General: Bowel sounds are normal. There is no distension or abdominal bruit.     Palpations: Abdomen is soft. There is no hepatomegaly, splenomegaly, mass or pulsatile mass.      Tenderness: There is no abdominal tenderness.  Musculoskeletal:        General: Normal range of motion.     Cervical back: Normal range of motion and neck supple.  Lymphadenopathy:     Cervical: No cervical adenopathy.  Skin:    General: Skin is warm and dry.  Neurological:     Mental Status: She is alert and oriented to person, place, and time.     Deep Tendon Reflexes: Reflexes are normal and symmetric.  Psychiatric:        Behavior: Behavior normal.        Thought Content: Thought content normal.        Judgment: Judgment normal.    BP (!) 144/78   Pulse 88   Temp (!) 96.9 F (36.1 C) (Temporal)   Resp 20   Ht '5\' 3"'$  (1.6 m)   Wt 152 lb (68.9 kg)   SpO2 97%   BMI 26.93 kg/m         Assessment & Plan:   Amy Black in today with chief complaint of Cough (Had flu on January 1)   1. Congestion of nasal sinus Continue nasal spry in mornings Zyrtec in evenings Run humidifier RTO prn    The above assessment and management plan was discussed  with the patient. The patient verbalized understanding of and has agreed to the management plan. Patient is aware to call the clinic if symptoms persist or worsen. Patient is aware when to return to the clinic for a follow-up visit. Patient educated on when it is appropriate to go to the emergency department.   Mary-Margaret Hassell Done, FNP

## 2022-02-27 ENCOUNTER — Telehealth: Payer: Self-pay | Admitting: Cardiology

## 2022-02-27 NOTE — Telephone Encounter (Signed)
Pt c/o of Chest Pain: STAT if CP now or developed within 24 hours  1. Are you having CP right now? Chest pains- not at this time  2. Are you experiencing any other symptoms (ex. SOB, nausea, vomiting, sweating)? She have had shortness of breath, not at this time-tired  3. How long have you been experiencing CP? About 2 weeks  4. Is your CP continuous or coming and going? Comes and goes  5. Have you taken Nitroglycerin? Yes-  she wanted an appointment - I made her an appointment on Friday(03-03-22) with Dr Warren Lacy- Patient was offered an appointment for tomorrow(02-28-22) with Ambrose Pancoast- wanted to wait and see Dr Percival Spanish on Friday ?Marland Kitchen

## 2022-02-27 NOTE — Telephone Encounter (Signed)
Spoke with patient who stated she has had on and off chest pain for a week or so. Last week, she was woken from sleep with "severe pain" in her mid chest 10/10. She used 2 NTG without relief and took tramadol, which provided relief. Reviewed use of NTG and EMS. There is confusion on the dose of Imdur she is to take. She stated she is taking '120mg'$  daily. Med list show '60mg'$  daily. She has appointment with Dr. Percival Spanish on 1/26.

## 2022-02-28 ENCOUNTER — Ambulatory Visit: Payer: Medicare Other | Admitting: Nurse Practitioner

## 2022-03-02 NOTE — Progress Notes (Signed)
Cardiology Office Note   Date:  03/03/2022   ID:  Amy Black, DOB 01/10/1941, MRN [id number]  PCP:  [name], MD  Cardiologist:   Minus Breeding, MD   Chief Complaint  Patient presents with   Chest Pain      History of Present Illness: Amy Black is a 82 y.o. female who presents for a follow up of presumed coronary spasm. On 08/28/2014, she had left heart cath that showed minimal CAD, 20% disease in LAD was normal LVEF. She also had history of palpitations and hypertension. She has been on verapamil for greater than 25 years.  She has had recurrent chest pain and has been treated with increased doses of Imdur.  She had relief of her pain after being taken off of verapamil and started on Cardizem.   She was having presyncope.   She wore a monitor for 2 weeks and had runs of SVT that did not correlate with her episodes of weakness.    She called for evaluation of chest pain.  She said this woke her from her sleep several days ago.  She said it was a sharp pain.  It was mid chest.  There was no radiation.  She took nitroglycerin without improvement.  Took a tramadol.  It eventually went away after about half hour.  She felt a little short of breath.  There was no radiation to her jaw or her arms.  She has had some mild midepigastric discomfort with some nausea recently.   Past Medical History:  Diagnosis Date   Adenocarcinoma ([place])    LEFT LEG   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 minimal CAD by cath in 2016 with presumed spasm (20% mLAD, 20% D2).   Cataract    bilateral cataracts removed   Coronary artery spasm ([facility])    Diverticulosis of colon (without mention of hemorrhage)    Femoral neck fracture, right, closed, initial encounter 10/04/2015   GERD (gastroesophageal reflux disease)    hepatic cyst    History of nuclear stress test 04/2011   lexiscan; normal study, no  significant ischemia, low risk; 2015 - normal   HTN (hypertension)    PT. DENIES   Hyperlipidemia    Insomnia    Irritable bowel syndrome    Transverse myelitis ([place])    Vitamin D deficiency     Past Surgical History:  Procedure Laterality Date   ABDOMINAL HYSTERECTOMY Bilateral    CARDIAC CATHETERIZATION     coronary spasm   CARDIAC CATHETERIZATION N/A 08/28/2014   Procedure: Left Heart Cath and Coronary Angiography;  Surgeon: Jettie Booze, MD;  Location: [facility];  Service: Cardiovascular;  Laterality: N/A;   COLONOSCOPY     DIAGNOSTIC LAPAROSCOPY     LYSIS OF ADHESION     PELVIC LAPAROSCOPY  1992   RSO, AND LSO ON 2007   RESECTION SOFT TISSUE TUMOR LEG / ANKLE RADICAL  2004   leg lesion resection & radiation   SALPINGOOPHORECTOMY Left    TOTAL HIP ARTHROPLASTY Right 10/04/2015   Procedure: TOTAL HIP ARTHROPLASTY ANTERIOR APPROACH;  Surgeon: Rod Can, MD;  Location: [facility];  Service: Orthopedics;  Laterality: Right;   TRANSTHORACIC ECHOCARDIOGRAM  07/2012   LV cavity size mildly reduced, normal wall motion; MV with calcified annulus and mild MR; LA mildly dilated; atrial septum  with increased thickness - lipomatous hypertrophy; RV systolic pressure increased (borderline pulm HTN)   UPPER GASTROINTESTINAL ENDOSCOPY       Current Outpatient Medications  Medication Sig Dispense Refill   ALLERGY RELIEF 10 MG tablet TAKE ONE TABLET BY MOUTH DAILY 30 tablet 11   ALPRAZolam (XANAX) 0.5 MG tablet Take 1 tablet (0.5 mg total) by mouth 3 (three) times daily. 90 tablet 5   benzonatate (TESSALON PERLES) 100 MG capsule Take 1 capsule (100 mg total) by mouth 3 (three) times daily as needed for cough. 20 capsule 0   cetirizine (ZYRTEC) 10 MG tablet Take 1 tablet (10 mg total) by mouth daily. 30 tablet 11   cyclobenzaprine (FLEXERIL) 5 MG tablet TAKE ONE TABLET BY MOUTH THREE TIMES DAILY AS NEEDED FOR MUSCLE SPASMS. 90 tablet 5   hydrOXYzine (ATARAX) 10 MG tablet TAKE 1  TABLET TWICE DAILY. 60 tablet 3   Hyoscyamine Sulfate SL (LEVSIN/SL) 0.125 MG SUBL Place 0.125 mg under the tongue every 4 (four) hours as needed (abd cramping and diarrhea). 120 tablet 5   lidocaine (XYLOCAINE) 5 % ointment Apply 1 application. topically as needed. 35.44 g 0   mometasone (ELOCON) 0.1 % cream Apply 1 Application topically daily. To affected eye brow area 45 g 5   nitroGLYCERIN (NITROSTAT) 0.4 MG SL tablet DISSOLVE ONE TABLET UNDER TONGUE EVERY 5 MINUTES UP TO 3 DOSES AS NEEDED FOR CHEST PAIN 25 tablet 11   traMADol (ULTRAM) 50 MG tablet TAKE ONE TABLET BY MOUTH EVERY TWELVE HOURS AS NEEDED. 60 tablet 1   triamterene-hydrochlorothiazide (DYAZIDE) 37.5-25 MG capsule TAKE ONE CAPSULE BY MOUTH 3 TIMES PER WEEK 90 capsule 3   verapamil (CALAN-SR) 120 MG CR tablet Take 1 tablet (120 mg total) by mouth at bedtime. 90 tablet 3   isosorbide mononitrate (IMDUR) 120 MG 24 hr tablet Take 1 tablet (120 mg total) by mouth daily. 90 tablet 3   No current facility-administered medications for this visit.    Allergies:   Iodine, Ivp dye [iodinated contrast media], Amlodipine, Lexapro [escitalopram oxalate], Cymbalta [duloxetine hcl], Gabapentin, and Zanaflex [tizanidine hcl]   ROS:  Please see the history of present illness.   Otherwise, review of systems are positive for none.   All other systems are reviewed and negative.    PHYSICAL EXAM: VS:  BP 126/68 (BP Location: Right Arm, Patient Position: Sitting, Cuff Size: Normal)   Pulse 71   Ht '5\' 3"'$  (1.6 m)   Wt 148 lb (67.1 kg)   BMI 26.22 kg/m  , BMI Body mass index is 26.22 kg/m. GENERAL:  Well appearing NECK:  No jugular venous distention, waveform within normal limits, carotid upstroke brisk and symmetric, no bruits, no thyromegaly LUNGS:  Clear to auscultation bilaterally CHEST:  Unremarkable HEART:  PMI not displaced or sustained,S1 and S2 within normal limits, no S3, no S4, no clicks, no rubs, brief 2 out of 6 apical systolic  murmur radiating slightly at the right upper tract, no diastolic murmurs ABD:  Flat, positive bowel sounds normal in frequency in pitch, no bruits, no rebound, no guarding, no midline pulsatile mass, no hepatomegaly, no splenomegaly EXT:  2 plus pulses throughout, no edema, no cyanosis no clubbing  EKG:  EKG is not ordered today. Sinus rhythm, rate 71, axis within normal limits, intervals within normal limits, no acute ST-T wave changes.   Recent Labs: 11/11/2021: ALT 7; BUN 9; Creatinine, Ser 0.76; Hemoglobin 11.1; Platelets 412; Potassium 4.3; Sodium 140; TSH 0.049  Lipid Panel    Component Value Date/Time   CHOL 177 06/07/2021 1452   CHOL 203 (H) 08/29/2012 1316   TRIG 96 06/07/2021 1452   TRIG 131 08/29/2012 1316   HDL 68 06/07/2021 1452   HDL 68 08/29/2012 1316   CHOLHDL 2.6 06/07/2021 1452   LDLCALC 92 06/07/2021 1452   LDLCALC 109 (H) 08/29/2012 1316      Wt Readings from Last 3 Encounters:  03/03/22 148 lb (67.1 kg)  02/21/22 152 lb (68.9 kg)  01/02/22 158 lb (71.7 kg)      Other studies Reviewed: Additional studies/ records that were reviewed today include: Labs. Review of the above records demonstrates :  NA    ASSESSMENT AND PLAN:    Coronary Spasm:   Her chest pain is somewhat atypical.  It may be GI.  However, she has a history of coronary spasm.  I am going to increase her Imdur to 120 mg daily.  HTN:   Her blood pressure is at target.  No change in therapy other than above.   Fatigue:   She continues to have this as a complaint.  Her TSH was borderline previously.  I will check a TSH and CBC.   Current medicines are reviewed at length with the patient today.  The patient does not have concerns regarding medicines.  The following changes have been made: No change in therapy.    Labs/ tests ordered today include:   None  Orders Placed This Encounter  Procedures   TSH   CBC   EKG 12-Lead     Disposition:   FU with me 12 months. Ronnell Guadalajara, MD  03/03/2022 5:22 PM    [facility]

## 2022-03-03 ENCOUNTER — Encounter: Payer: Self-pay | Admitting: Cardiology

## 2022-03-03 ENCOUNTER — Ambulatory Visit: Payer: Medicare Other | Attending: Nurse Practitioner | Admitting: Cardiology

## 2022-03-03 VITALS — BP 126/68 | HR 71 | Ht 63.0 in | Wt 148.0 lb

## 2022-03-03 DIAGNOSIS — R031 Nonspecific low blood-pressure reading: Secondary | ICD-10-CM

## 2022-03-03 DIAGNOSIS — R5383 Other fatigue: Secondary | ICD-10-CM | POA: Diagnosis not present

## 2022-03-03 DIAGNOSIS — I25111 Atherosclerotic heart disease of native coronary artery with angina pectoris with documented spasm: Secondary | ICD-10-CM

## 2022-03-03 DIAGNOSIS — I10 Essential (primary) hypertension: Secondary | ICD-10-CM

## 2022-03-03 MED ORDER — ISOSORBIDE MONONITRATE ER 120 MG PO TB24: 120.0000 mg | ORAL_TABLET | Freq: Every day | ORAL | 3 refills | Status: DC

## 2022-03-03 NOTE — Patient Instructions (Signed)
Medication Instructions:  Your physician has recommended you make the following change in your medication:  INCREASE: Imdur '120mg'$  daily  *If you need a refill on your cardiac medications before your next appointment, please call your pharmacy*   Lab Work: Your physician recommends that you have the following labs drawn today: TSH and CBC  If you have labs (blood work) drawn today and your tests are completely normal, you will receive your results only by: [place] (if you have MyChart) OR A paper copy in the mail If you have any lab test that is abnormal or we need to change your treatment, we will call you to review the results.   Testing/Procedures: NONE   Follow-Up: At [facility], you and your health needs are our priority.  As part of our continuing mission to provide you with exceptional heart care, we have created designated Provider Care Teams.  These Care Teams include your primary Cardiologist (physician) and Advanced Practice Providers (APPs -  Physician Assistants and Nurse Practitioners) who all work together to provide you with the care you need, when you need it.  We recommend signing up for the patient portal called "MyChart".  Sign up information is provided on this After Visit Summary.  MyChart is used to connect with patients for Virtual Visits (Telemedicine).  Patients are able to view lab/test results, encounter notes, upcoming appointments, etc.  Non-urgent messages can be sent to your provider as well.   To learn more about what you can do with MyChart, go to [email or web address].    Your next appointment:   6 month(s)  Provider:   Minus Breeding, MD

## 2022-03-04 LAB — CBC
Hematocrit: 33.8 % — ABNORMAL LOW (ref 34.0–46.6)
Hemoglobin: 11.4 g/dL (ref 11.1–15.9)
MCH: 31.1 pg (ref 26.6–33.0)
MCHC: 33.7 g/dL (ref 31.5–35.7)
MCV: 92 fL (ref 79–97)
Platelets: 369 10*3/uL (ref 150–450)
RBC: 3.66 x10E6/uL — ABNORMAL LOW (ref 3.77–5.28)
RDW: 12.8 % (ref 11.7–15.4)
WBC: 4.4 10*3/uL (ref 3.4–10.8)

## 2022-03-04 LAB — TSH: TSH: 0.039 u[IU]/mL — ABNORMAL LOW (ref 0.450–4.500)

## 2022-03-06 ENCOUNTER — Telehealth: Payer: Self-pay | Admitting: *Deleted

## 2022-03-06 DIAGNOSIS — R7989 Other specified abnormal findings of blood chemistry: Secondary | ICD-10-CM

## 2022-03-06 NOTE — Telephone Encounter (Signed)
-----  Message from Minus Breeding, MD sent at 03/05/2022  2:20 PM EST ----- TSH was low.  Please check a T4 and T3.  Call Ms. Elsen with the results and send results to [name], MD

## 2022-03-06 NOTE — Telephone Encounter (Signed)
pt aware of results  Lab orders mailed to the pt

## 2022-03-06 NOTE — Telephone Encounter (Signed)
Patient has been seen.

## 2022-03-10 DIAGNOSIS — R002 Palpitations: Secondary | ICD-10-CM | POA: Diagnosis not present

## 2022-03-10 DIAGNOSIS — R7989 Other specified abnormal findings of blood chemistry: Secondary | ICD-10-CM | POA: Diagnosis not present

## 2022-03-10 NOTE — Telephone Encounter (Signed)
Pt states that she has not received these orders in the mail, but she would like to come to the office and pick them up. She states she will be there some time today and asks if they can be ready at the front desk.

## 2022-03-10 NOTE — Telephone Encounter (Signed)
Patient stated she is in [place] today and will pick up lab orders for T3 and T4. Orders in front office.

## 2022-03-11 LAB — T3, FREE: T3, Free: 4 pg/mL (ref 2.0–4.4)

## 2022-03-11 LAB — T4, FREE: Free T4: 1.28 ng/dL (ref 0.82–1.77)

## 2022-03-13 ENCOUNTER — Encounter: Payer: Self-pay | Admitting: *Deleted

## 2022-03-14 ENCOUNTER — Telehealth: Payer: Self-pay | Admitting: Cardiology

## 2022-03-14 NOTE — Telephone Encounter (Signed)
Patient was calling for update on the results. Please advise

## 2022-03-14 NOTE — Telephone Encounter (Signed)
Spoke with patient about her lab results. Patient verbalizes understanding. No further questions or concerns at this time.

## 2022-03-16 ENCOUNTER — Telehealth: Payer: Self-pay | Admitting: Cardiology

## 2022-03-16 NOTE — Telephone Encounter (Signed)
Patient states she may have left papers at the office at her last appt 01/26 and is requesting call back to confirm if they were left or not.

## 2022-03-16 NOTE — Telephone Encounter (Signed)
Pt wanted lab results. Current lab results T3 and T4 given to her as per Dr. Percival Spanish.

## 2022-03-22 ENCOUNTER — Other Ambulatory Visit: Payer: Self-pay | Admitting: Cardiology

## 2022-03-22 ENCOUNTER — Ambulatory Visit (INDEPENDENT_AMBULATORY_CARE_PROVIDER_SITE_OTHER): Payer: Medicare Other

## 2022-03-22 VITALS — Ht 63.0 in | Wt 150.0 lb

## 2022-03-22 DIAGNOSIS — Z780 Asymptomatic menopausal state: Secondary | ICD-10-CM | POA: Diagnosis not present

## 2022-03-22 DIAGNOSIS — Z0000 Encounter for general adult medical examination without abnormal findings: Secondary | ICD-10-CM | POA: Diagnosis not present

## 2022-03-22 DIAGNOSIS — R7989 Other specified abnormal findings of blood chemistry: Secondary | ICD-10-CM | POA: Diagnosis not present

## 2022-03-22 NOTE — Progress Notes (Signed)
Subjective:   Amy Black is a 82 y.o. female who presents for Medicare Annual (Subsequent) preventive examination. I connected with  Jocelyn Dauphin Thies on 03/22/22 by a audio enabled telemedicine application and verified that I am speaking with the correct person using two identifiers.  Patient Location: Home  Provider Location: Home Office  I discussed the limitations of evaluation and management by telemedicine. The patient expressed understanding and agreed to proceed.  Review of Systems     Cardiac Risk Factors include: advanced age (>14mn, >>32women);hypertension     Objective:    Today's Vitals   03/22/22 1018  Weight: 150 lb (68 kg)  Height: 5' 3"$  (1.6 m)   Body mass index is 26.57 kg/m.     03/22/2022   10:22 AM 11/24/2021   11:22 AM 10/13/2021    2:37 PM 04/28/2021    2:40 PM 02/14/2021    2:58 PM 10/18/2020    1:26 PM 04/15/2020    1:47 PM  Advanced Directives  Does Patient Have a Medical Advance Directive? No No No No No No No  Would patient like information on creating a medical advance directive? No - Patient declined No - Patient declined  No - Patient declined No - Patient declined No - Patient declined No - Patient declined    Current Medications (verified) Outpatient Encounter Medications as of 03/22/2022  Medication Sig   ALLERGY RELIEF 10 MG tablet TAKE ONE TABLET BY MOUTH DAILY   ALPRAZolam (XANAX) 0.5 MG tablet Take 1 tablet (0.5 mg total) by mouth 3 (three) times daily.   benzonatate (TESSALON PERLES) 100 MG capsule Take 1 capsule (100 mg total) by mouth 3 (three) times daily as needed for cough.   cetirizine (ZYRTEC) 10 MG tablet Take 1 tablet (10 mg total) by mouth daily.   cyclobenzaprine (FLEXERIL) 5 MG tablet TAKE ONE TABLET BY MOUTH THREE TIMES DAILY AS NEEDED FOR MUSCLE SPASMS.   hydrOXYzine (ATARAX) 10 MG tablet TAKE 1 TABLET TWICE DAILY.   Hyoscyamine Sulfate SL (LEVSIN/SL) 0.125 MG SUBL Place 0.125 mg under the tongue every 4 (four) hours  as needed (abd cramping and diarrhea).   isosorbide mononitrate (IMDUR) 120 MG 24 hr tablet Take 1 tablet (120 mg total) by mouth daily.   lidocaine (XYLOCAINE) 5 % ointment Apply 1 application. topically as needed.   mometasone (ELOCON) 0.1 % cream Apply 1 Application topically daily. To affected eye brow area   nitroGLYCERIN (NITROSTAT) 0.4 MG SL tablet DISSOLVE ONE TABLET UNDER TONGUE EVERY 5 MINUTES UP TO 3 DOSES AS NEEDED FOR CHEST PAIN   traMADol (ULTRAM) 50 MG tablet TAKE ONE TABLET BY MOUTH EVERY TWELVE HOURS AS NEEDED.   triamterene-hydrochlorothiazide (DYAZIDE) 37.5-25 MG capsule TAKE ONE CAPSULE BY MOUTH 3 TIMES PER WEEK   verapamil (CALAN-SR) 120 MG CR tablet Take 1 tablet (120 mg total) by mouth at bedtime.   No facility-administered encounter medications on file as of 03/22/2022.    Allergies (verified) Iodine, Ivp dye [iodinated contrast media], Amlodipine, Lexapro [escitalopram oxalate], Cymbalta [duloxetine hcl], Gabapentin, and Zanaflex [tizanidine hcl]   History: Past Medical History:  Diagnosis Date   Adenocarcinoma ([facility]    LEFT LEG   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 minimal CAD by cath in 2016 with presumed spasm (20% mLAD, 20% D2).   Cataract    bilateral cataracts  removed   Coronary artery spasm (HCC)    Diverticulosis of colon (without mention of hemorrhage)    Femoral neck fracture, right, closed, initial encounter 10/04/2015   GERD (gastroesophageal reflux disease)    hepatic cyst    History of nuclear stress test 04/2011   lexiscan; normal study, no significant ischemia, low risk; 2015 - normal   HTN (hypertension)    PT. DENIES   Hyperlipidemia    Insomnia    Irritable bowel syndrome    Transverse myelitis (HCC)    Vitamin D deficiency    Past Surgical History:  Procedure Laterality Date   ABDOMINAL HYSTERECTOMY Bilateral    CARDIAC CATHETERIZATION      coronary spasm   CARDIAC CATHETERIZATION N/A 08/28/2014   Procedure: Left Heart Cath and Coronary Angiography;  Surgeon: Jettie Booze, MD;  Location: [facility];  Service: Cardiovascular;  Laterality: N/A;   COLONOSCOPY     DIAGNOSTIC LAPAROSCOPY     LYSIS OF ADHESION     PELVIC LAPAROSCOPY  1992   RSO, AND LSO ON 2007   RESECTION SOFT TISSUE TUMOR LEG / ANKLE RADICAL  2004   leg lesion resection & radiation   SALPINGOOPHORECTOMY Left    TOTAL HIP ARTHROPLASTY Right 10/04/2015   Procedure: TOTAL HIP ARTHROPLASTY ANTERIOR APPROACH;  Surgeon: Rod Can, MD;  Location: [facility];  Service: Orthopedics;  Laterality: Right;   TRANSTHORACIC ECHOCARDIOGRAM  07/2012   LV cavity size mildly reduced, normal wall motion; MV with calcified annulus and mild MR; LA mildly dilated; atrial septum with increased thickness - lipomatous hypertrophy; RV systolic pressure increased (borderline pulm HTN)   UPPER GASTROINTESTINAL ENDOSCOPY     Family History  Problem Relation Age of Onset   Bladder Cancer Mother    Heart disease Mother    Hypertension Mother    Heart disease Father    Stroke Father    Hyperlipidemia Sister    Depression Sister    Diabetes Brother    Seizures Brother    Hypertension Brother    Diabetes Other        Multiple family members on both sides    Coronary artery disease Other        Multiple family members on both sides    Colon cancer Neg Hx    Esophageal cancer Neg Hx    Pancreatic cancer Neg Hx    Rectal cancer Neg Hx    Stomach cancer Neg Hx    Breast cancer Neg Hx    Social History   Socioeconomic History   Marital status: Married    Spouse name: Augustin Coupe   Number of children: 2   Years of education: 12   Highest education level: High school graduate  Occupational History   Occupation: [place]   Occupation: DISABILITY    Employer: UNEMPLOYED  Tobacco Use   Smoking status: Former    Packs/day: 0.50    Years: 4.00    Total pack years: 2.00     Types: Cigarettes    Start date: 12/07/1968    Quit date: 11/20/1972    Years since quitting: 49.3   Smokeless tobacco: Never   Tobacco comments:    quit 40 years ago  Vaping Use   Vaping Use: Never used  Substance and Sexual Activity   Alcohol use: No    Alcohol/week: 0.0 standard drinks of alcohol   Drug use: No   Sexual activity: Not Currently    Birth control/protection: Surgical  Other Topics  Concern   Not on file  Social History Narrative   Disabled, lives with husband and daughter.  Enjoys shopping. Lives in two story home   1 son and 1 daughter.   1 grandson.   Caffeine 2 cans of soda and tea   Right handed   Social Determinants of Health   Financial Resource Strain: Low Risk  (03/22/2022)   Overall Financial Resource Strain (CARDIA)    Difficulty of Paying Living Expenses: Not hard at all  Food Insecurity: No Food Insecurity (03/22/2022)   Hunger Vital Sign    Worried About Running Out of Food in the Last Year: Never true    Ran Out of Food in the Last Year: Never true  Transportation Needs: No Transportation Needs (03/22/2022)   PRAPARE - Hydrologist (Medical): No    Lack of Transportation (Non-Medical): No  Physical Activity: Inactive (03/22/2022)   Exercise Vital Sign    Days of Exercise per Week: 0 days    Minutes of Exercise per Session: 0 min  Stress: No Stress Concern Present (03/22/2022)   [place]    Feeling of Stress : Not at all  Social Connections: Moderately Integrated (03/22/2022)   Social Connection and Isolation Panel [NHANES]    Frequency of Communication with Friends and Family: More than three times a week    Frequency of Social Gatherings with Friends and Family: More than three times a week    Attends Religious Services: More than 4 times per year    Active Member of [employer] or Organizations: No    Attends Music therapist: Never     Marital Status: Married    Tobacco Counseling Counseling given: Not Answered Tobacco comments: quit 40 years ago   Clinical Intake:  Pre-visit preparation completed: Yes  Pain : No/denies pain     Nutritional Risks: None Diabetes: No  How often do you need to have someone help you when you read instructions, pamphlets, or other written materials from your doctor or pharmacy?: 1 - Never  Diabetic?no   Interpreter Needed?: No  Information entered by :: Jadene Pierini, LPN   Activities of Daily Living    03/22/2022   10:22 AM  In your present state of health, do you have any difficulty performing the following activities:  Hearing? 0  Vision? 0  Difficulty concentrating or making decisions? 0  Walking or climbing stairs? 0  Dressing or bathing? 0  Doing errands, shopping? 0  Preparing Food and eating ? N  Using the Toilet? N  In the past six months, have you accidently leaked urine? N  Do you have problems with loss of bowel control? N  Managing your Medications? N  Managing your Finances? N  Housekeeping or managing your Housekeeping? N    Patient Care Team: [name], MD as PCP - General (Family Medicine) Minus Breeding, MD as PCP - Cardiology (Cardiology) Lonna Cobb, PT (Inactive) as Physical Therapist (Physical Therapy) Celso Amy, NP as Nurse Practitioner (Nurse Practitioner) Doran Stabler, MD as Consulting Physician (Gastroenterology) Rod Can, MD as Consulting Physician (Orthopedic Surgery) Volanda Napoleon, MD as Consulting Physician (Oncology)  Indicate any recent Medical Services you may have received from other than [facility] providers in the past year (date may be approximate).     Assessment:   This is a routine wellness examination for [place].  Hearing/Vision screen Vision Screening - Comments:: Wears rx  glasses - up to date with routine eye exams with  Dr.Gould   Dietary issues and exercise activities  discussed: Current Exercise Habits: The patient does not participate in regular exercise at present, Exercise limited by: orthopedic condition(s)   Goals Addressed             This Visit's Progress    Cut out extra servings   On track    Reduce snack intake.        Depression Screen    03/22/2022   10:20 AM 02/21/2022   12:23 PM 01/02/2022    1:16 PM 12/19/2021   12:18 PM 11/11/2021    2:16 PM 06/07/2021    2:09 PM 04/04/2021    1:20 PM  PHQ 2/9 Scores  PHQ - 2 Score 0 0 0 0 0 0 0  PHQ- 9 Score 0 0  0 1  0    Fall Risk    03/22/2022   10:19 AM 02/21/2022   12:23 PM 01/02/2022    1:16 PM 12/19/2021   12:17 PM 11/11/2021    2:16 PM  Fall Risk   Falls in the past year? 0 0 0 0 0  Number falls in past yr: 0   0   Injury with Fall? 0   0   Risk for fall due to : No Fall Risks   No Fall Risks   Follow up Falls prevention discussed   Falls evaluation completed     FALL RISK PREVENTION PERTAINING TO THE HOME:  Any stairs in or around the home? Yes  If so, are there any without handrails? No  Home free of loose throw rugs in walkways, pet beds, electrical cords, etc? Yes  Adequate lighting in your home to reduce risk of falls? Yes   ASSISTIVE DEVICES UTILIZED TO PREVENT FALLS:  Life alert? No  Use of a cane, walker or w/c? Yes  Grab bars in the bathroom? No  Shower chair or bench in shower? Yes  Elevated toilet seat or a handicapped toilet? No       06/14/2017    3:00 PM 12/07/2015    3:11 PM  MMSE - Mini Mental State Exam  Orientation to time 5 5  Orientation to Place 5 5  Registration 3 3  Attention/ Calculation 3 3  Recall 3 3  Language- name 2 objects 2 2  Language- repeat 1 1  Language- follow 3 step command 3 3  Language- read & follow direction 1 1  Write a sentence 1 1  Copy design 1 1  Total score 28 28        03/22/2022   10:22 AM 09/16/2019    9:35 AM 09/10/2018    2:21 PM  6CIT Screen  What Year? 0 points 0 points 0 points  What month? 0  points 0 points 0 points  What time? 0 points 0 points 0 points  Count back from 20 0 points 2 points 0 points  Months in reverse 0 points 4 points 2 points  Repeat phrase 0 points 0 points 0 points  Total Score 0 points 6 points 2 points    Immunizations Immunization History  Administered Date(s) Administered   PFIZER(Purple Top)SARS-COV-2 Vaccination 05/08/2019, 05/29/2019   Pneumococcal Polysaccharide-23 02/07/2000   Tdap 02/06/2002   Zoster Recombinat (Shingrix) 11/06/2016, 01/08/2017   Zoster, Live 02/06/2010    TDAP status: Due, Education has been provided regarding the importance of this vaccine. Advised may receive this vaccine at local  pharmacy or Health Dept. Aware to provide a copy of the vaccination record if obtained from local pharmacy or Health Dept. Verbalized acceptance and understanding.  Flu Vaccine status: Due, Education has been provided regarding the importance of this vaccine. Advised may receive this vaccine at local pharmacy or Health Dept. Aware to provide a copy of the vaccination record if obtained from local pharmacy or Health Dept. Verbalized acceptance and understanding.  Pneumococcal vaccine status: Up to date  Covid-19 vaccine status: Declined, Education has been provided regarding the importance of this vaccine but patient still declined. Advised may receive this vaccine at local pharmacy or Health Dept.or vaccine clinic. Aware to provide a copy of the vaccination record if obtained from local pharmacy or Health Dept. Verbalized acceptance and understanding.  Qualifies for Shingles Vaccine? Yes   Zostavax completed Yes   Shingrix Completed?: Yes  Screening Tests Health Maintenance  Topic Date Due   DTaP/Tdap/Td (2 - Td or Tdap) 02/07/2012   DEXA SCAN  12/06/2017   COVID-19 Vaccine (3 - Pfizer risk series) 06/26/2019   INFLUENZA VACCINE  05/07/2022 (Originally 09/06/2021)   Pneumonia Vaccine 40+ Years old (2 of 2 - PCV) 09/20/2022 (Originally  10/13/2007)   MAMMOGRAM  12/09/2022   Medicare Annual Wellness (AWV)  03/23/2023   COLONOSCOPY (Pts 45-59yr Insurance coverage will need to be confirmed)  11/10/2025   Zoster Vaccines- Shingrix  Completed   HPV VACCINES  Aged Out   PAP SMEAR-Modifier  Discontinued   Hepatitis C Screening  Discontinued    Health Maintenance  Health Maintenance Due  Topic Date Due   DTaP/Tdap/Td (2 - Td or Tdap) 02/07/2012   DEXA SCAN  12/06/2017   COVID-19 Vaccine (3 - Pfizer risk series) 06/26/2019    Colorectal cancer screening: No longer required.   Mammogram status: No longer required due to age.  Bone Density status: Ordered 03/22/2022. Pt provided with contact info and advised to call to schedule appt.  Lung Cancer Screening: (Low Dose CT Chest recommended if Age 80-80years, 30 pack-year currently smoking OR have quit w/in 15years.) does not qualify.   Lung Cancer Screening Referral: n/a  Additional Screening:  Hepatitis C Screening: does not qualify;   Vision Screening: Recommended annual ophthalmology exams for early detection of glaucoma and other disorders of the eye. Is the patient up to date with their annual eye exam?  Yes  Who is the provider or what is the name of the office in which the patient attends annual eye exams? Dr.Gould  If pt is not established with a provider, would they like to be referred to a provider to establish care? No .   Dental Screening: Recommended annual dental exams for proper oral hygiene  Community Resource Referral / Chronic Care Management: CRR required this visit?  No   CCM required this visit?  No      Plan:     I have personally reviewed and noted the following in the patient's chart:   Medical and social history Use of alcohol, tobacco or illicit drugs  Current medications and supplements including opioid prescriptions. Patient is not currently taking opioid prescriptions. Functional ability and status Nutritional status Physical  activity Advanced directives List of other physicians Hospitalizations, surgeries, and ER visits in previous 12 months Vitals Screenings to include cognitive, depression, and falls Referrals and appointments  In addition, I have reviewed and discussed with patient certain preventive protocols, quality metrics, and best practice recommendations. A written personalized care plan for preventive services  as well as general preventive health recommendations were provided to patient.     Daphane Shepherd, LPN   [id number]   Nurse Notes: Due Tdap Vaccine

## 2022-03-22 NOTE — Patient Instructions (Signed)
Amy Black , Thank you for taking time to come for your Medicare Wellness Visit. I appreciate your ongoing commitment to your health goals. Please review the following plan we discussed and let me know if I can assist you in the future.   These are the goals we discussed:  Goals      Cut out extra servings     Reduce snack intake.      DIET - INCREASE WATER INTAKE     Try to drink 6-8 glasses of water daily.     Patient Stated     09/16/2019 AWV Goal: Keep All Scheduled Appointments  Over the next year, patient will attend all scheduled appointments with their PCP and any specialists that they see.         This is a list of the screening recommended for you and due dates:  Health Maintenance  Topic Date Due   DTaP/Tdap/Td vaccine (2 - Td or Tdap) 02/07/2012   DEXA scan (bone density measurement)  12/06/2017   COVID-19 Vaccine (3 - Pfizer risk series) 06/26/2019   Flu Shot  05/07/2022*   Pneumonia Vaccine (2 of 2 - PCV) 09/20/2022*   Mammogram  12/09/2022   Medicare Annual Wellness Visit  03/23/2023   Colon Cancer Screening  11/10/2025   Zoster (Shingles) Vaccine  Completed   HPV Vaccine  Aged Out   Pap Smear  Discontinued   Hepatitis C Screening: USPSTF Recommendation to screen - Ages 78-79 yo.  Discontinued  *Topic was postponed. The date shown is not the original due date.    Advanced directives: Advance directive discussed with you today. I have provided a copy for you to complete at home and have notarized. Once this is complete please bring a copy in to our office so we can scan it into your chart.   Conditions/risks identified: Aim for 30 minutes of exercise or brisk walking, 6-8 glasses of water, and 5 servings of fruits and vegetables each day.   Next appointment: Follow up in one year for your annual wellness visit    Preventive Care 65 Years and Older, Female Preventive care refers to lifestyle choices and visits with your health care provider that can promote  health and wellness. What does preventive care include? A yearly physical exam. This is also called an annual well check. Dental exams once or twice a year. Routine eye exams. Ask your health care provider how often you should have your eyes checked. Personal lifestyle choices, including: Daily care of your teeth and gums. Regular physical activity. Eating a healthy diet. Avoiding tobacco and drug use. Limiting alcohol use. Practicing safe sex. Taking low-dose aspirin every day. Taking vitamin and mineral supplements as recommended by your health care provider. What happens during an annual well check? The services and screenings done by your health care provider during your annual well check will depend on your age, overall health, lifestyle risk factors, and family history of disease. Counseling  Your health care provider may ask you questions about your: Alcohol use. Tobacco use. Drug use. Emotional well-being. Home and relationship well-being. Sexual activity. Eating habits. History of falls. Memory and ability to understand (cognition). Work and work Statistician. Reproductive health. Screening  You may have the following tests or measurements: Height, weight, and BMI. Blood pressure. Lipid and cholesterol levels. These may be checked every 5 years, or more frequently if you are over 52 years old. Skin check. Lung cancer screening. You may have this screening every year starting  at age 69 if you have a 30-pack-year history of smoking and currently smoke or have quit within the past 15 years. Fecal occult blood test (FOBT) of the stool. You may have this test every year starting at age 66. Flexible sigmoidoscopy or colonoscopy. You may have a sigmoidoscopy every 5 years or a colonoscopy every 10 years starting at age 58. Hepatitis C blood test. Hepatitis B blood test. Sexually transmitted disease (STD) testing. Diabetes screening. This is done by checking your blood sugar  (glucose) after you have not eaten for a while (fasting). You may have this done every 1-3 years. Bone density scan. This is done to screen for osteoporosis. You may have this done starting at age 73. Mammogram. This may be done every 1-2 years. Talk to your health care provider about how often you should have regular mammograms. Talk with your health care provider about your test results, treatment options, and if necessary, the need for more tests. Vaccines  Your health care provider may recommend certain vaccines, such as: Influenza vaccine. This is recommended every year. Tetanus, diphtheria, and acellular pertussis (Tdap, Td) vaccine. You may need a Td booster every 10 years. Zoster vaccine. You may need this after age 70. Pneumococcal 13-valent conjugate (PCV13) vaccine. One dose is recommended after age 58. Pneumococcal polysaccharide (PPSV23) vaccine. One dose is recommended after age 67. Talk to your health care provider about which screenings and vaccines you need and how often you need them. This information is not intended to replace advice given to you by your health care provider. Make sure you discuss any questions you have with your health care provider. Document Released: 02/19/2015 Document Revised: 10/13/2015 Document Reviewed: 11/24/2014 Elsevier Interactive Patient Education  2017 [employer] Prevention in the Home Falls can cause injuries. They can happen to people of all ages. There are many things you can do to make your home safe and to help prevent falls. What can I do on the outside of my home? Regularly fix the edges of walkways and driveways and fix any cracks. Remove anything that might make you trip as you walk through a door, such as a raised step or threshold. Trim any bushes or trees on the path to your home. Use bright outdoor lighting. Clear any walking paths of anything that might make someone trip, such as rocks or tools. Regularly check to see  if handrails are loose or broken. Make sure that both sides of any steps have handrails. Any raised decks and porches should have guardrails on the edges. Have any leaves, snow, or ice cleared regularly. Use sand or salt on walking paths during winter. Clean up any spills in your garage right away. This includes oil or grease spills. What can I do in the bathroom? Use night lights. Install grab bars by the toilet and in the tub and shower. Do not use towel bars as grab bars. Use non-skid mats or decals in the tub or shower. If you need to sit down in the shower, use a plastic, non-slip stool. Keep the floor dry. Clean up any water that spills on the floor as soon as it happens. Remove soap buildup in the tub or shower regularly. Attach bath mats securely with double-sided non-slip rug tape. Do not have throw rugs and other things on the floor that can make you trip. What can I do in the bedroom? Use night lights. Make sure that you have a light by your bed that is easy  to reach. Do not use any sheets or blankets that are too big for your bed. They should not hang down onto the floor. Have a firm chair that has side arms. You can use this for support while you get dressed. Do not have throw rugs and other things on the floor that can make you trip. What can I do in the kitchen? Clean up any spills right away. Avoid walking on wet floors. Keep items that you use a lot in easy-to-reach places. If you need to reach something above you, use a strong step stool that has a grab bar. Keep electrical cords out of the way. Do not use floor polish or wax that makes floors slippery. If you must use wax, use non-skid floor wax. Do not have throw rugs and other things on the floor that can make you trip. What can I do with my stairs? Do not leave any items on the stairs. Make sure that there are handrails on both sides of the stairs and use them. Fix handrails that are broken or loose. Make sure that  handrails are as long as the stairways. Check any carpeting to make sure that it is firmly attached to the stairs. Fix any carpet that is loose or worn. Avoid having throw rugs at the top or bottom of the stairs. If you do have throw rugs, attach them to the floor with carpet tape. Make sure that you have a light switch at the top of the stairs and the bottom of the stairs. If you do not have them, ask someone to add them for you. What else can I do to help prevent falls? Wear shoes that: Do not have high heels. Have rubber bottoms. Are comfortable and fit you well. Are closed at the toe. Do not wear sandals. If you use a stepladder: Make sure that it is fully opened. Do not climb a closed stepladder. Make sure that both sides of the stepladder are locked into place. Ask someone to hold it for you, if possible. Clearly mark and make sure that you can see: Any grab bars or handrails. First and last steps. Where the edge of each step is. Use tools that help you move around (mobility aids) if they are needed. These include: Canes. Walkers. Scooters. Crutches. Turn on the lights when you go into a dark area. Replace any light bulbs as soon as they burn out. Set up your furniture so you have a clear path. Avoid moving your furniture around. If any of your floors are uneven, fix them. If there are any pets around you, be aware of where they are. Review your medicines with your doctor. Some medicines can make you feel dizzy. This can increase your chance of falling. Ask your doctor what other things that you can do to help prevent falls. This information is not intended to replace advice given to you by your health care provider. Make sure you discuss any questions you have with your health care provider. Document Released: 11/19/2008 Document Revised: 07/01/2015 Document Reviewed: 02/27/2014 Elsevier Interactive Patient Education  2017 [employer].

## 2022-03-24 ENCOUNTER — Encounter: Payer: Self-pay | Admitting: *Deleted

## 2022-03-24 LAB — T3, FREE: T3, Free: 4 pg/mL (ref 2.0–4.4)

## 2022-03-24 LAB — T4, FREE: Free T4: 1.37 ng/dL (ref 0.82–1.77)

## 2022-03-28 DIAGNOSIS — H52203 Unspecified astigmatism, bilateral: Secondary | ICD-10-CM | POA: Diagnosis not present

## 2022-03-28 DIAGNOSIS — Z961 Presence of intraocular lens: Secondary | ICD-10-CM | POA: Diagnosis not present

## 2022-03-28 DIAGNOSIS — H43813 Vitreous degeneration, bilateral: Secondary | ICD-10-CM | POA: Diagnosis not present

## 2022-03-29 ENCOUNTER — Other Ambulatory Visit: Payer: Self-pay | Admitting: Family Medicine

## 2022-03-29 DIAGNOSIS — F419 Anxiety disorder, unspecified: Secondary | ICD-10-CM

## 2022-04-18 ENCOUNTER — Ambulatory Visit: Payer: Medicare Other | Admitting: Family Medicine

## 2022-04-20 ENCOUNTER — Telehealth: Payer: Self-pay

## 2022-04-20 ENCOUNTER — Ambulatory Visit: Payer: Medicare Other | Admitting: Family Medicine

## 2022-04-20 NOTE — Telephone Encounter (Signed)
Erroneous encounter

## 2022-04-21 ENCOUNTER — Encounter: Payer: Self-pay | Admitting: Family Medicine

## 2022-04-26 ENCOUNTER — Ambulatory Visit: Payer: Self-pay

## 2022-04-26 DIAGNOSIS — I10 Essential (primary) hypertension: Secondary | ICD-10-CM

## 2022-04-26 NOTE — Progress Notes (Signed)
Patient has been checking blood pressure at home.  Latest blood pressure reading is 119/79.

## 2022-05-04 ENCOUNTER — Other Ambulatory Visit: Payer: Self-pay | Admitting: Family Medicine

## 2022-05-04 DIAGNOSIS — I10 Essential (primary) hypertension: Secondary | ICD-10-CM

## 2022-05-29 ENCOUNTER — Other Ambulatory Visit: Payer: Self-pay | Admitting: Family

## 2022-05-29 DIAGNOSIS — C801 Malignant (primary) neoplasm, unspecified: Secondary | ICD-10-CM

## 2022-05-29 DIAGNOSIS — D500 Iron deficiency anemia secondary to blood loss (chronic): Secondary | ICD-10-CM

## 2022-05-30 ENCOUNTER — Encounter: Payer: Self-pay | Admitting: Family

## 2022-05-30 ENCOUNTER — Inpatient Hospital Stay: Payer: Medicare Other | Attending: Hematology & Oncology

## 2022-05-30 ENCOUNTER — Inpatient Hospital Stay: Payer: Medicare Other | Admitting: Family

## 2022-05-30 ENCOUNTER — Other Ambulatory Visit: Payer: Self-pay

## 2022-05-30 VITALS — BP 125/74 | HR 74 | Temp 98.3°F | Resp 18

## 2022-05-30 DIAGNOSIS — D500 Iron deficiency anemia secondary to blood loss (chronic): Secondary | ICD-10-CM

## 2022-05-30 DIAGNOSIS — Z859 Personal history of malignant neoplasm, unspecified: Secondary | ICD-10-CM | POA: Insufficient documentation

## 2022-05-30 DIAGNOSIS — D75838 Other thrombocytosis: Secondary | ICD-10-CM | POA: Diagnosis not present

## 2022-05-30 DIAGNOSIS — C801 Malignant (primary) neoplasm, unspecified: Secondary | ICD-10-CM

## 2022-05-30 DIAGNOSIS — D509 Iron deficiency anemia, unspecified: Secondary | ICD-10-CM | POA: Insufficient documentation

## 2022-05-30 LAB — CBC WITH DIFFERENTIAL (CANCER CENTER ONLY)
Abs Immature Granulocytes: 0.01 10*3/uL (ref 0.00–0.07)
Basophils Absolute: 0.1 10*3/uL (ref 0.0–0.1)
Basophils Relative: 1 %
Eosinophils Absolute: 0.1 10*3/uL (ref 0.0–0.5)
Eosinophils Relative: 2 %
HCT: 34.2 % — ABNORMAL LOW (ref 36.0–46.0)
Hemoglobin: 11.2 g/dL — ABNORMAL LOW (ref 12.0–15.0)
Immature Granulocytes: 0 %
Lymphocytes Relative: 37 %
Lymphs Abs: 2 10*3/uL (ref 0.7–4.0)
MCH: 30.5 pg (ref 26.0–34.0)
MCHC: 32.7 g/dL (ref 30.0–36.0)
MCV: 93.2 fL (ref 80.0–100.0)
Monocytes Absolute: 0.6 10*3/uL (ref 0.1–1.0)
Monocytes Relative: 11 %
Neutro Abs: 2.6 10*3/uL (ref 1.7–7.7)
Neutrophils Relative %: 49 %
Platelet Count: 385 10*3/uL (ref 150–400)
RBC: 3.67 MIL/uL — ABNORMAL LOW (ref 3.87–5.11)
RDW: 13 % (ref 11.5–15.5)
WBC Count: 5.3 10*3/uL (ref 4.0–10.5)
nRBC: 0 % (ref 0.0–0.2)

## 2022-05-30 LAB — RETICULOCYTES
Immature Retic Fract: 9.7 % (ref 2.3–15.9)
RBC.: 3.63 MIL/uL — ABNORMAL LOW (ref 3.87–5.11)
Retic Count, Absolute: 60.6 10*3/uL (ref 19.0–186.0)
Retic Ct Pct: 1.7 % (ref 0.4–3.1)

## 2022-05-30 LAB — CMP (CANCER CENTER ONLY)
ALT: 13 U/L (ref 0–44)
AST: 18 U/L (ref 15–41)
Albumin: 4.1 g/dL (ref 3.5–5.0)
Alkaline Phosphatase: 83 U/L (ref 38–126)
Anion gap: 7 (ref 5–15)
BUN: 11 mg/dL (ref 8–23)
CO2: 27 mmol/L (ref 22–32)
Calcium: 9.3 mg/dL (ref 8.9–10.3)
Chloride: 101 mmol/L (ref 98–111)
Creatinine: 0.84 mg/dL (ref 0.44–1.00)
GFR, Estimated: >60 mL/min (ref >60)
Glucose, Bld: 87 mg/dL (ref 70–99)
Potassium: 4.1 mmol/L (ref 3.5–5.1)
Sodium: 135 mmol/L (ref 135–145)
Total Bilirubin: 0.2 mg/dL — ABNORMAL LOW (ref 0.3–1.2)
Total Protein: 7.1 g/dL (ref 6.5–8.1)

## 2022-05-30 LAB — FERRITIN: Ferritin: 190 ng/mL (ref 11–307)

## 2022-05-30 NOTE — Progress Notes (Signed)
Hematology and Oncology Follow Up Visit  Amy Black [id number] Nov 21, 1940 83 y.o. 05/30/2022   Principle Diagnosis:  Transient thrombocytosis History transverse myelitis History of adenosquamous carcinoma of unknown primary Iron def anemia   Current Therapy:        Fusion Plus -- daily   Interim History:  Amy Black is here today with her son for follow-up. She is doing well but has the occasional twinge of pain in her left lower back.  No falls or syncope reported.  No swelling, tenderness, numbness or tingling in her extremities.  She has fatigue at times and states that she has a heard time sleeping at night and will take short naps sometimes during the day.  No fever, chills, n/v, cough, rash, dizziness, SOB, chest pain, palpitations, abdominal pain or changes in bowel or bladder habits.  Appetite and hydration are good. Weight is stable at 160 lbs.   ECOG Performance Status: 1 - Symptomatic but completely ambulatory  Medications:  Allergies as of 05/30/2022       Reactions   Iodine Itching   Ivp Dye [iodinated Contrast Media] Itching   Amlodipine Other (See Comments)   headaches   Lexapro [escitalopram Oxalate] Other (See Comments)   Drunk, High feeling   Cymbalta [duloxetine Hcl] Other (See Comments)   sleepiness   Gabapentin Other (See Comments)   sleepiness   Zanaflex [tizanidine Hcl] Other (See Comments)   Very drunk feeling next day        Medication List        Accurate as of May 30, 2022  2:24 PM. If you have any questions, ask your nurse or doctor.          Allergy Relief 10 MG tablet Generic drug: loratadine TAKE ONE TABLET BY MOUTH DAILY   ALPRAZolam 0.5 MG tablet Commonly known as: XANAX Take 1 tablet (0.5 mg total) by mouth 3 (three) times daily.   benzonatate 100 MG capsule Commonly known as: Tessalon Perles Take 1 capsule (100 mg total) by mouth 3 (three) times daily as needed for cough.   cetirizine 10 MG tablet Commonly  known as: ZYRTEC Take 1 tablet (10 mg total) by mouth daily.   cyclobenzaprine 5 MG tablet Commonly known as: FLEXERIL TAKE ONE TABLET BY MOUTH THREE TIMES DAILY AS NEEDED FOR MUSCLE SPASMS.   hydrOXYzine 10 MG tablet Commonly known as: ATARAX TAKE 1 TABLET TWICE DAILY.   Hyoscyamine Sulfate SL 0.125 MG Subl Commonly known as: Levsin/SL Place 0.125 mg under the tongue every 4 (four) hours as needed (abd cramping and diarrhea).   isosorbide mononitrate 120 MG 24 hr tablet Commonly known as: IMDUR Take 1 tablet (120 mg total) by mouth daily.   lidocaine 5 % ointment Commonly known as: XYLOCAINE Apply 1 application. topically as needed.   mometasone 0.1 % cream Commonly known as: Elocon Apply 1 Application topically daily. To affected eye brow area   nitroGLYCERIN 0.4 MG SL tablet Commonly known as: NITROSTAT DISSOLVE ONE TABLET UNDER TONGUE EVERY 5 MINUTES UP TO 3 DOSES AS NEEDED FOR CHEST PAIN   traMADol 50 MG tablet Commonly known as: ULTRAM TAKE ONE TABLET BY MOUTH EVERY TWELVE HOURS AS NEEDED.   triamterene-hydrochlorothiazide 37.5-25 MG capsule Commonly known as: DYAZIDE TAKE ONE CAPSULE BY MOUTH 3 TIMES PER WEEK   verapamil 120 MG CR tablet Commonly known as: CALAN-SR Take 1 tablet (120 mg total) by mouth at bedtime.        Allergies:  Allergies  Allergen Reactions   Iodine Itching   Ivp Dye [Iodinated Contrast Media] Itching   Amlodipine Other (See Comments)    headaches   Lexapro [Escitalopram Oxalate] Other (See Comments)    Drunk, High feeling   Cymbalta [Duloxetine Hcl] Other (See Comments)    sleepiness   Gabapentin Other (See Comments)    sleepiness   Zanaflex [Tizanidine Hcl] Other (See Comments)    Very drunk feeling next day    Past Medical History, Surgical history, Social history, and Family History were reviewed and updated.  Review of Systems: All other 10 point review of systems is negative.   Physical Exam:  oral temperature is  98.3 F (36.8 C). Her blood pressure is 125/74 and her pulse is 74. Her respiration is 18 and oxygen saturation is 100%.   Wt Readings from Last 3 Encounters:  03/22/22 150 lb (68 kg)  03/03/22 148 lb (67.1 kg)  02/21/22 152 lb (68.9 kg)    Ocular: Sclerae unicteric, pupils equal, round and reactive to light Ear-nose-throat: Oropharynx clear, dentition fair Lymphatic: No cervical or supraclavicular adenopathy Lungs no rales or rhonchi, good excursion bilaterally Heart regular rate and rhythm, no murmur appreciated Abd soft, nontender, positive bowel sounds MSK no focal spinal tenderness, no joint edema Neuro: non-focal, well-oriented, appropriate affect Breasts: Deferred   Lab Results  Component Value Date   WBC 5.3 05/30/2022   HGB 11.2 (L) 05/30/2022   HCT 34.2 (L) 05/30/2022   MCV 93.2 05/30/2022   PLT 385 05/30/2022   Lab Results  Component Value Date   FERRITIN 281 04/28/2021   IRON 92 10/18/2020   TIBC 235 (L) 10/18/2020   UIBC 143 10/18/2020   IRONPCTSAT 39 (H) 10/18/2020   Lab Results  Component Value Date   RETICCTPCT 1.7 05/30/2022   RBC 3.63 (L) 05/30/2022   No results found for: "KPAFRELGTCHN", "LAMBDASER", "KAPLAMBRATIO" No results found for: "IGGSERUM", "IGA", "IGMSERUM" No results found for: "TOTALPROTELP", "ALBUMINELP", "A1GS", "A2GS", "BETS", "BETA2SER", "GAMS", "MSPIKE", "SPEI"   Chemistry      Component Value Date/Time   NA 140 11/11/2021 1427   NA 138 01/28/2016 1303   K 4.3 11/11/2021 1427   K 4.2 07/28/2016 1243   K 4.2 01/28/2016 1303   CL 102 11/11/2021 1427   CL 98 07/28/2016 1243   CL 101 07/20/2014 1131   CO2 24 11/11/2021 1427   CO2 29 07/28/2016 1243   CO2 30 (H) 01/28/2016 1303   BUN 9 11/11/2021 1427   BUN 10.0 01/28/2016 1303   CREATININE 0.76 11/11/2021 1427   CREATININE 0.78 04/28/2021 1350   CREATININE 0.94 07/28/2016 1243   CREATININE 0.8 01/28/2016 1303      Component Value Date/Time   CALCIUM 9.6 11/11/2021 1427    CALCIUM 9.9 07/28/2016 1243   CALCIUM 10.0 01/28/2016 1303   ALKPHOS 96 11/11/2021 1427   ALKPHOS 105 07/28/2016 1243   ALKPHOS 123 01/28/2016 1303   AST 14 11/11/2021 1427   AST 19 04/28/2021 1350   AST 13 01/28/2016 1303   ALT 7 11/11/2021 1427   ALT 16 04/28/2021 1350   ALT 12 01/28/2016 1303   BILITOT 0.4 11/11/2021 1427   BILITOT 0.5 04/28/2021 1350   BILITOT 0.51 01/28/2016 1303       Impression and Plan: Amy Black is a very pleasant 82yo African American female with history of transient thrombocytosis and also history of adenosquamous carcinoma with unknown primary. She has done well and so far there has been no  recurrence.  Iron studies are pending.  Follow-up in 6 months.   Eileen Stanford, NP 4/23/20242:24 PM

## 2022-05-31 LAB — IRON AND IRON BINDING CAPACITY (CC-WL,HP ONLY)
Iron: 113 ug/dL (ref 28–170)
Saturation Ratios: 47 % — ABNORMAL HIGH (ref 10.4–31.8)
TIBC: 239 ug/dL — ABNORMAL LOW (ref 250–450)
UIBC: 126 ug/dL — ABNORMAL LOW (ref 148–442)

## 2022-06-07 DIAGNOSIS — R350 Frequency of micturition: Secondary | ICD-10-CM | POA: Diagnosis not present

## 2022-06-07 DIAGNOSIS — N281 Cyst of kidney, acquired: Secondary | ICD-10-CM | POA: Diagnosis not present

## 2022-06-14 ENCOUNTER — Other Ambulatory Visit: Payer: Self-pay | Admitting: Family Medicine

## 2022-06-26 NOTE — Telephone Encounter (Signed)
Erroneous encounter will close.

## 2022-07-04 ENCOUNTER — Other Ambulatory Visit: Payer: Self-pay | Admitting: Family Medicine

## 2022-07-04 ENCOUNTER — Other Ambulatory Visit: Payer: Medicare Other

## 2022-07-04 ENCOUNTER — Ambulatory Visit: Payer: Medicare Other | Admitting: Family Medicine

## 2022-07-04 DIAGNOSIS — Z780 Asymptomatic menopausal state: Secondary | ICD-10-CM

## 2022-07-05 ENCOUNTER — Encounter: Payer: Self-pay | Admitting: Family Medicine

## 2022-08-04 ENCOUNTER — Other Ambulatory Visit: Payer: Self-pay | Admitting: Family Medicine

## 2022-08-04 ENCOUNTER — Telehealth: Payer: Self-pay | Admitting: Family Medicine

## 2022-08-04 DIAGNOSIS — F419 Anxiety disorder, unspecified: Secondary | ICD-10-CM

## 2022-08-07 ENCOUNTER — Ambulatory Visit: Payer: Medicare Other

## 2022-08-07 ENCOUNTER — Ambulatory Visit (INDEPENDENT_AMBULATORY_CARE_PROVIDER_SITE_OTHER): Payer: Medicare Other | Admitting: Family Medicine

## 2022-08-07 ENCOUNTER — Encounter: Payer: Self-pay | Admitting: Family Medicine

## 2022-08-07 VITALS — BP 115/61 | HR 80 | Temp 98.0°F | Ht 63.0 in | Wt 145.8 lb

## 2022-08-07 DIAGNOSIS — Z13220 Encounter for screening for lipoid disorders: Secondary | ICD-10-CM

## 2022-08-07 DIAGNOSIS — G373 Acute transverse myelitis in demyelinating disease of central nervous system: Secondary | ICD-10-CM | POA: Diagnosis not present

## 2022-08-07 DIAGNOSIS — R7989 Other specified abnormal findings of blood chemistry: Secondary | ICD-10-CM

## 2022-08-07 DIAGNOSIS — I10 Essential (primary) hypertension: Secondary | ICD-10-CM | POA: Diagnosis not present

## 2022-08-07 MED ORDER — AMOXICILLIN-POT CLAVULANATE 875-125 MG PO TABS: 1.0000 | ORAL_TABLET | Freq: Two times a day (BID) | ORAL | 0 refills | Status: DC

## 2022-08-07 MED ORDER — ALPRAZOLAM 0.5 MG PO TABS: 0.5000 mg | ORAL_TABLET | Freq: Three times a day (TID) | ORAL | 5 refills | Status: DC

## 2022-08-07 MED ORDER — TRAMADOL HCL 50 MG PO TABS
ORAL_TABLET | ORAL | 1 refills | Status: DC
Start: 2022-08-07 — End: 2022-11-17

## 2022-08-07 NOTE — Telephone Encounter (Signed)
okay

## 2022-08-07 NOTE — Progress Notes (Signed)
Subjective:  Patient ID: Amy Black, female    DOB: [birth year]  Age: 82 y.o. MRN: [id number]  CC: Medical Management of Chronic Issues   HPI Amy Black presents for dry cough, runny nose, congestion in throat.   Wants to get off xanax, but can't go without it. Somewhat conflicted. Unwilling to try taper plus quetiapine. PDMP review shows appropriate utilization of the patient's controlled medications.   Tramadol for pain. Pain is in knee, leg, some relief with tramadol. Moderate pain daily.     08/07/2022    1:53 PM 03/22/2022   10:20 AM 02/21/2022   12:23 PM  Depression screen PHQ 2/9  Decreased Interest 0 0 0  Down, Depressed, Hopeless 0 0 0  PHQ - 2 Score 0 0 0  Altered sleeping  0 0  Tired, decreased energy  0 0  Change in appetite  0 0  Feeling bad or failure about yourself   0 0  Trouble concentrating  0 0  Moving slowly or fidgety/restless  0 0  Suicidal thoughts  0 0  PHQ-9 Score  0 0  Difficult doing work/chores  Not difficult at all Not difficult at all      02/21/2022   12:24 PM 12/19/2021   12:18 PM 11/11/2021    2:16 PM 04/04/2021    1:20 PM  GAD 7 : Generalized Anxiety Score  Nervous, Anxious, on Edge 0 0 0 0  Control/stop worrying 0 0 0 0  Worry too much - different things 0 0 0 0  Trouble relaxing 0 0 0 0  Restless 0 0 0 0  Easily annoyed or irritable 0 0 0 0  Afraid - awful might happen 0 0 0 0  Total GAD 7 Score 0 0 0 0  Anxiety Difficulty Not difficult at all Not difficult at all Not difficult at all       History Amy Black has a past medical history of Adenocarcinoma (HCC), Adenosquamous carcinoma, Allergy, Anxiety, CAD (coronary artery disease), Cataract, Coronary artery spasm (HCC), Diverticulosis of colon (without mention of hemorrhage), Femoral neck fracture, right, closed, initial encounter (10/04/2015), GERD (gastroesophageal reflux disease), hepatic cyst, History of nuclear stress test (04/2011), HTN (hypertension), Hyperlipidemia,  Insomnia, Irritable bowel syndrome, Transverse myelitis (HCC), and Vitamin D deficiency.   She has a past surgical history that includes Resection soft tissue tumor leg / ankle radical (2004); Diagnostic laparoscopy; Lysis of adhesion; Salpingoophorectomy (Left); Pelvic laparoscopy (1992); Cardiac catheterization; transthoracic echocardiogram (07/2012); Cardiac catheterization (N/A, 08/28/2014); Colonoscopy; Upper gastrointestinal endoscopy; Total hip arthroplasty (Right, 10/04/2015); and Abdominal hysterectomy (Bilateral).   Her family history includes Bladder Cancer in her mother; Coronary artery disease in an other family member; Depression in her sister; Diabetes in her brother and another family member; Heart disease in her father and mother; Hyperlipidemia in her sister; Hypertension in her brother and mother; Seizures in her brother; Stroke in her father.She reports that she quit smoking about 49 years ago. Her smoking use included cigarettes. She started smoking about 53 years ago. She has a 2.00 pack-year smoking history. She has never used smokeless tobacco. She reports that she does not drink alcohol and does not use drugs.    ROS Review of Systems  Constitutional: Negative.   HENT: Negative.    Eyes:  Negative for visual disturbance.  Respiratory:  Negative for shortness of breath.   Cardiovascular:  Negative for chest pain.  Gastrointestinal:  Negative for abdominal pain.  Musculoskeletal:  Positive for arthralgias and  back pain.    Objective:  BP 115/61   Pulse 80   Temp 98 F (36.7 C)   Ht 5\' 3"  (1.6 m)   Wt 145 lb 12.8 oz (66.1 kg)   SpO2 98%   BMI 25.83 kg/m   BP Readings from Last 3 Encounters:  08/07/22 115/61  05/30/22 125/74  04/26/22 119/79    Wt Readings from Last 3 Encounters:  08/07/22 145 lb 12.8 oz (66.1 kg)  03/22/22 150 lb (68 kg)  03/03/22 148 lb (67.1 kg)     Physical Exam Constitutional:      General: She is not in acute distress.     Appearance: She is well-developed.  Cardiovascular:     Rate and Rhythm: Normal rate and regular rhythm.  Pulmonary:     Breath sounds: Normal breath sounds.  Musculoskeletal:        General: Normal range of motion.  Skin:    General: Skin is warm and dry.  Neurological:     Mental Status: She is alert and oriented to person, place, and time.       Assessment & Plan:   Amy Black was seen today for medical management of chronic issues.  Diagnoses and all orders for this visit:  Primary hypertension -     CBC with Differential/Platelet -     CMP14+EGFR  Lipid screening -     Lipid panel  Abnormal TSH -     TSH + free T4  Transverse myelitis (HCC) -     traMADol (ULTRAM) 50 MG tablet; TAKE ONE TABLET BY MOUTH EVERY TWELVE HOURS AS NEEDED.  Other orders -     ALPRAZolam (XANAX) 0.5 MG tablet; Take 1 tablet (0.5 mg total) by mouth 3 (three) times daily. -     amoxicillin-clavulanate (AUGMENTIN) 875-125 MG tablet; Take 1 tablet by mouth 2 (two) times daily. Take all of this medication       I have discontinued Amy Black's benzonatate. I have also changed her ALPRAZolam. Additionally, I am having her start on amoxicillin-clavulanate. Lastly, I am having her maintain her Hyoscyamine Sulfate SL, nitroGLYCERIN, Allergy Relief, lidocaine, mometasone, triamterene-hydrochlorothiazide, cyclobenzaprine, verapamil, cetirizine, isosorbide mononitrate, hydrOXYzine, and traMADol.  Allergies as of 08/07/2022       Reactions   Iodine Itching   Ivp Dye [iodinated Contrast Media] Itching   Amlodipine Other (See Comments)   headaches   Lexapro [escitalopram Oxalate] Other (See Comments)   Drunk, High feeling   Cymbalta [duloxetine Hcl] Other (See Comments)   sleepiness   Gabapentin Other (See Comments)   sleepiness   Zanaflex [tizanidine Hcl] Other (See Comments)   Very drunk feeling next day        Medication List        Accurate as of August 07, 2022  5:53 PM. If you  have any questions, ask your nurse or doctor.          STOP taking these medications    benzonatate 100 MG capsule Commonly known as: Lawyer Stopped by: Mechele Claude, MD       TAKE these medications    Allergy Relief 10 MG tablet Generic drug: loratadine TAKE ONE TABLET BY MOUTH DAILY   ALPRAZolam 0.5 MG tablet Commonly known as: XANAX Take 1 tablet (0.5 mg total) by mouth 3 (three) times daily. Started by: Mechele Claude, MD   amoxicillin-clavulanate [phone] MG tablet Commonly known as: AUGMENTIN Take 1 tablet by mouth 2 (two) times daily. Take all of  this medication Started by: Mechele Claude, MD   cetirizine 10 MG tablet Commonly known as: ZYRTEC Take 1 tablet (10 mg total) by mouth daily.   cyclobenzaprine 5 MG tablet Commonly known as: FLEXERIL TAKE ONE TABLET BY MOUTH THREE TIMES DAILY AS NEEDED FOR MUSCLE SPASMS.   hydrOXYzine 10 MG tablet Commonly known as: ATARAX TAKE 1 TABLET BY MOUTH TWICE DAILY.   Hyoscyamine Sulfate SL 0.125 MG Subl Commonly known as: Levsin/SL Place 0.125 mg under the tongue every 4 (four) hours as needed (abd cramping and diarrhea).   isosorbide mononitrate 120 MG 24 hr tablet Commonly known as: IMDUR Take 1 tablet (120 mg total) by mouth daily.   lidocaine 5 % ointment Commonly known as: XYLOCAINE Apply 1 application. topically as needed.   mometasone 0.1 % cream Commonly known as: Elocon Apply 1 Application topically daily. To affected eye brow area   nitroGLYCERIN 0.4 MG SL tablet Commonly known as: NITROSTAT DISSOLVE ONE TABLET UNDER TONGUE EVERY 5 MINUTES UP TO 3 DOSES AS NEEDED FOR CHEST PAIN   traMADol 50 MG tablet Commonly known as: ULTRAM TAKE ONE TABLET BY MOUTH EVERY TWELVE HOURS AS NEEDED.   triamterene-hydrochlorothiazide 37.5-25 MG capsule Commonly known as: DYAZIDE TAKE ONE CAPSULE BY MOUTH 3 TIMES PER WEEK   verapamil 120 MG CR tablet Commonly known as: CALAN-SR Take 1 tablet (120 mg  total) by mouth at bedtime.         Follow-up: Return in about 6 months (around 02/07/2023).  Mechele Claude, M.D.

## 2022-08-08 ENCOUNTER — Telehealth: Payer: Self-pay | Admitting: Family Medicine

## 2022-08-08 NOTE — Telephone Encounter (Signed)
REFERRAL REQUEST Telephone Note  Have you been seen at our office for this problem? NO (Advise that they may need an appointment with their PCP before a referral can be done)  Reason for Referral: Right across both shoulder blade. Wants a MRI Referral discussed with patient: NO  Best contact number of patient for referral team: [phone]    Has patient been seen by a specialist for this issue before: NO  Patient provider preference for referral: n/a Patient location preference for referral: [place] on [facility]   Patient notified that referrals can take up to a week or longer to process. If they haven't heard anything within a week they should call back and speak with the referral department.

## 2022-08-08 NOTE — Telephone Encounter (Signed)
PATIENT NEEDS APPOINTMENT FOR THIS

## 2022-08-09 NOTE — Telephone Encounter (Signed)
Left message for pt to call back and schedule appt °

## 2022-08-15 ENCOUNTER — Telehealth: Payer: Self-pay | Admitting: *Deleted

## 2022-08-15 NOTE — Telephone Encounter (Signed)
Call received from patient stating that her energy is low and that she would like to come in for lab work this week.  Message sent to scheduling to call pt and bring her in for labs and to see a provider this week.

## 2022-08-17 ENCOUNTER — Inpatient Hospital Stay: Payer: Medicare Other | Admitting: Medical Oncology

## 2022-08-17 ENCOUNTER — Other Ambulatory Visit: Payer: Self-pay

## 2022-08-17 ENCOUNTER — Encounter: Payer: Self-pay | Admitting: Medical Oncology

## 2022-08-17 ENCOUNTER — Inpatient Hospital Stay: Payer: Medicare Other | Attending: Hematology & Oncology

## 2022-08-17 VITALS — BP 147/73 | HR 62 | Temp 98.2°F | Resp 18 | Ht 63.0 in | Wt 146.4 lb

## 2022-08-17 DIAGNOSIS — C801 Malignant (primary) neoplasm, unspecified: Secondary | ICD-10-CM

## 2022-08-17 DIAGNOSIS — D509 Iron deficiency anemia, unspecified: Secondary | ICD-10-CM | POA: Diagnosis not present

## 2022-08-17 DIAGNOSIS — Z859 Personal history of malignant neoplasm, unspecified: Secondary | ICD-10-CM | POA: Diagnosis not present

## 2022-08-17 DIAGNOSIS — R61 Generalized hyperhidrosis: Secondary | ICD-10-CM | POA: Diagnosis not present

## 2022-08-17 DIAGNOSIS — D75838 Other thrombocytosis: Secondary | ICD-10-CM | POA: Insufficient documentation

## 2022-08-17 DIAGNOSIS — G373 Acute transverse myelitis in demyelinating disease of central nervous system: Secondary | ICD-10-CM

## 2022-08-17 DIAGNOSIS — M549 Dorsalgia, unspecified: Secondary | ICD-10-CM

## 2022-08-17 DIAGNOSIS — R634 Abnormal weight loss: Secondary | ICD-10-CM | POA: Diagnosis not present

## 2022-08-17 DIAGNOSIS — D500 Iron deficiency anemia secondary to blood loss (chronic): Secondary | ICD-10-CM

## 2022-08-17 LAB — CBC WITH DIFFERENTIAL (CANCER CENTER ONLY)
Abs Immature Granulocytes: 0.03 10*3/uL (ref 0.00–0.07)
Basophils Absolute: 0.1 10*3/uL (ref 0.0–0.1)
Basophils Relative: 1 %
Eosinophils Absolute: 0.1 10*3/uL (ref 0.0–0.5)
Eosinophils Relative: 3 %
HCT: 35.2 % — ABNORMAL LOW (ref 36.0–46.0)
Hemoglobin: 11.3 g/dL — ABNORMAL LOW (ref 12.0–15.0)
Immature Granulocytes: 1 %
Lymphocytes Relative: 43 %
Lymphs Abs: 2.1 10*3/uL (ref 0.7–4.0)
MCH: 30.5 pg (ref 26.0–34.0)
MCHC: 32.1 g/dL (ref 30.0–36.0)
MCV: 95.1 fL (ref 80.0–100.0)
Monocytes Absolute: 0.4 10*3/uL (ref 0.1–1.0)
Monocytes Relative: 9 %
Neutro Abs: 2.1 10*3/uL (ref 1.7–7.7)
Neutrophils Relative %: 43 %
Platelet Count: 355 10*3/uL (ref 150–400)
RBC: 3.7 MIL/uL — ABNORMAL LOW (ref 3.87–5.11)
RDW: 13.1 % (ref 11.5–15.5)
WBC Count: 4.8 10*3/uL (ref 4.0–10.5)
nRBC: 0 % (ref 0.0–0.2)

## 2022-08-17 LAB — IRON AND IRON BINDING CAPACITY (CC-WL,HP ONLY)
Iron: 82 ug/dL (ref 28–170)
Saturation Ratios: 33 % — ABNORMAL HIGH (ref 10.4–31.8)
TIBC: 246 ug/dL — ABNORMAL LOW (ref 250–450)
UIBC: 164 ug/dL (ref 148–442)

## 2022-08-17 LAB — RETICULOCYTES
Immature Retic Fract: 10.4 % (ref 2.3–15.9)
RBC.: 3.64 MIL/uL — ABNORMAL LOW (ref 3.87–5.11)
Retic Count, Absolute: 64.1 10*3/uL (ref 19.0–186.0)
Retic Ct Pct: 1.8 % (ref 0.4–3.1)

## 2022-08-17 LAB — FERRITIN: Ferritin: 206 ng/mL (ref 11–307)

## 2022-08-17 MED ORDER — DIPHENHYDRAMINE HCL 50 MG PO TABS: ORAL_TABLET | ORAL | 0 refills | Status: DC

## 2022-08-17 MED ORDER — PREDNISONE 50 MG PO TABS: ORAL_TABLET | ORAL | 0 refills | Status: DC

## 2022-08-17 NOTE — Addendum Note (Signed)
Addended by: Clent Jacks on: 08/17/2022 11:24 AM   Modules accepted: Orders

## 2022-08-17 NOTE — Telephone Encounter (Signed)
Patient wants to change to [facility] Landry. Fed up with Dr. Darlyn Read.

## 2022-08-17 NOTE — Progress Notes (Addendum)
Hematology and Oncology Follow Up Visit  Amy Black [id number] [birth year] 82 y.o. 08/17/2022   Principle Diagnosis:  Transient thrombocytosis History transverse myelitis History of adenosquamous carcinoma of unknown primary Iron def anemia   Current Therapy:        Fusion Plus -- daily   Interim History:  Amy Black is here today with her son for follow-up  She reports that she is doing "ok" but that her back pain has worsened with time. Occurring every few weeks. Initially was on the left side around her bra wrapping around but now is bilateral in nature. Sharp stabbing pain 9/10 in nature. Debilitating per patient. Lasts hours before resolving. Associated with sweats, arm, pain. She has not tried taking her nitroglycerine for this pain. She is established with a cardiologist.   No falls or syncope reported.  No swelling, tenderness, numbness or tingling in her extremities.  She has fatigue at times and states that she has a heard time sleeping at night and will take short naps sometimes during the day.  No fever, chills, n/v, cough, rash, dizziness, SOB, chest pain, palpitations, abdominal pain or changes in bowel or bladder habits.  Appetite and hydration are good.   Wt Readings from Last 3 Encounters:  08/17/22 146 lb 6.4 oz (66.4 kg)  08/07/22 145 lb 12.8 oz (66.1 kg)  03/22/22 150 lb (68 kg)   ECOG Performance Status: 1 - Symptomatic but completely ambulatory  Medications:  Allergies as of 08/17/2022       Reactions   Iodine Itching   Ivp Dye [iodinated Contrast Media] Itching   Amlodipine Other (See Comments)   headaches   Lexapro [escitalopram Oxalate] Other (See Comments)   Drunk, High feeling   Cymbalta [duloxetine Hcl] Other (See Comments)   sleepiness   Gabapentin Other (See Comments)   sleepiness   Zanaflex [tizanidine Hcl] Other (See Comments)   Very drunk feeling next day        Medication List        Accurate as of August 17, 2022 10:08 AM.  If you have any questions, ask your nurse or doctor.          STOP taking these medications    amoxicillin-clavulanate 875-125 MG tablet Commonly known as: AUGMENTIN Stopped by: Rushie Chestnut       TAKE these medications    Allergy Relief 10 MG tablet Generic drug: loratadine TAKE ONE TABLET BY MOUTH DAILY   ALPRAZolam 0.5 MG tablet Commonly known as: XANAX Take 1 tablet (0.5 mg total) by mouth 3 (three) times daily.   cetirizine 10 MG tablet Commonly known as: ZYRTEC Take 1 tablet (10 mg total) by mouth daily.   cyclobenzaprine 5 MG tablet Commonly known as: FLEXERIL TAKE ONE TABLET BY MOUTH THREE TIMES DAILY AS NEEDED FOR MUSCLE SPASMS.   hydrOXYzine 10 MG tablet Commonly known as: ATARAX TAKE 1 TABLET BY MOUTH TWICE DAILY.   Hyoscyamine Sulfate SL 0.125 MG Subl Commonly known as: Levsin/SL Place 0.125 mg under the tongue every 4 (four) hours as needed (abd cramping and diarrhea).   isosorbide mononitrate 120 MG 24 hr tablet Commonly known as: IMDUR Take 1 tablet (120 mg total) by mouth daily.   lidocaine 5 % ointment Commonly known as: XYLOCAINE Apply 1 application. topically as needed.   mometasone 0.1 % cream Commonly known as: Elocon Apply 1 Application topically daily. To affected eye brow area   nitroGLYCERIN 0.4 MG SL tablet Commonly known as: NITROSTAT DISSOLVE  ONE TABLET UNDER TONGUE EVERY 5 MINUTES UP TO 3 DOSES AS NEEDED FOR CHEST PAIN   traMADol 50 MG tablet Commonly known as: ULTRAM TAKE ONE TABLET BY MOUTH EVERY TWELVE HOURS AS NEEDED.   triamterene-hydrochlorothiazide 37.5-25 MG capsule Commonly known as: DYAZIDE TAKE ONE CAPSULE BY MOUTH 3 TIMES PER WEEK   verapamil 120 MG CR tablet Commonly known as: CALAN-SR Take 1 tablet (120 mg total) by mouth at bedtime.        Allergies:  Allergies  Allergen Reactions   Iodine Itching   Ivp Dye [Iodinated Contrast Media] Itching   Amlodipine Other (See Comments)    headaches    Lexapro [Escitalopram Oxalate] Other (See Comments)    Drunk, High feeling   Cymbalta [Duloxetine Hcl] Other (See Comments)    sleepiness   Gabapentin Other (See Comments)    sleepiness   Zanaflex [Tizanidine Hcl] Other (See Comments)    Very drunk feeling next day    Past Medical History, Surgical history, Social history, and Family History were reviewed and updated.  Review of Systems: All other 10 point review of systems is negative.   Physical Exam:  height is 5\' 3"  (1.6 m) and weight is 146 lb 6.4 oz (66.4 kg). Her oral temperature is 98.2 F (36.8 C). Her blood pressure is 147/73 (abnormal) and her pulse is 62. Her respiration is 18 and oxygen saturation is 100%.   Wt Readings from Last 3 Encounters:  08/17/22 146 lb 6.4 oz (66.4 kg)  08/07/22 145 lb 12.8 oz (66.1 kg)  03/22/22 150 lb (68 kg)   Constitutional: AA X 3 Ocular: Sclerae unicteric, pupils equal, round and reactive to light Ear-nose-throat: Oropharynx clear, dentition fair Lymphatic: No cervical or supraclavicular adenopathy Lungs no rales or rhonchi, good excursion bilaterally Heart regular rate and rhythm, no murmur appreciated Abd soft MSK no focal spinal tenderness, no joint edema Neuro: non-focal, well-oriented, appropriate affect Skin: No rash  Lab Results  Component Value Date   WBC 4.8 08/17/2022   HGB 11.3 (L) 08/17/2022   HCT 35.2 (L) 08/17/2022   MCV 95.1 08/17/2022   PLT 355 08/17/2022   Lab Results  Component Value Date   FERRITIN 190 05/30/2022   IRON 113 05/30/2022   TIBC 239 (L) 05/30/2022   UIBC 126 (L) 05/30/2022   IRONPCTSAT 47 (H) 05/30/2022   Lab Results  Component Value Date   RETICCTPCT 1.8 08/17/2022   RBC 3.70 (L) 08/17/2022   RBC 3.64 (L) 08/17/2022   No results found for: "KPAFRELGTCHN", "LAMBDASER", "KAPLAMBRATIO" No results found for: "IGGSERUM", "IGA", "IGMSERUM" No results found for: "TOTALPROTELP", "ALBUMINELP", "A1GS", "A2GS", "BETS", "BETA2SER", "GAMS",  "MSPIKE", "SPEI"   Chemistry      Component Value Date/Time   NA 135 05/30/2022 1353   NA 140 11/11/2021 1427   NA 138 01/28/2016 1303   K 4.1 05/30/2022 1353   K 4.2 07/28/2016 1243   K 4.2 01/28/2016 1303   CL 101 05/30/2022 1353   CL 98 07/28/2016 1243   CL 101 07/20/2014 1131   CO2 27 05/30/2022 1353   CO2 29 07/28/2016 1243   CO2 30 (H) 01/28/2016 1303   BUN 11 05/30/2022 1353   BUN 9 11/11/2021 1427   BUN 10.0 01/28/2016 1303   CREATININE 0.84 05/30/2022 1353   CREATININE 0.94 07/28/2016 1243   CREATININE 0.8 01/28/2016 1303      Component Value Date/Time   CALCIUM 9.3 05/30/2022 1353   CALCIUM 9.9 07/28/2016 1243  CALCIUM 10.0 01/28/2016 1303   ALKPHOS 83 05/30/2022 1353   ALKPHOS 105 07/28/2016 1243   ALKPHOS 123 01/28/2016 1303   AST 18 05/30/2022 1353   AST 13 01/28/2016 1303   ALT 13 05/30/2022 1353   ALT 12 01/28/2016 1303   BILITOT 0.2 (L) 05/30/2022 1353   BILITOT 0.51 01/28/2016 1303     Encounter Diagnoses  Name Primary?   Adenocarcinoma of unknown primary (HCC) Yes   Transverse myelitis (HCC)    Mid back pain    Unintentional weight loss    Night sweats     Impression and Plan: Ms. Soo is a very pleasant 82yo African American female with history of transient thrombocytosis and also history of adenosquamous carcinoma with unknown primary.   Patient is concerned about her pain, as am I. She asks for a CT scan which I feel is appropriate given her worsening pain, history, labs, weight loss. Given her allergy to contrast dye we discuss premedication which she has done well with in the past. Meds sent to pharmacy. She will also call her cardiologist for a check up in case this is cardiac related. Reviewed red flag signs and symptoms. Would not be a bad idea for her to trial a nitroglycerine during these episodes to see if it helps.   Iron studies are pending.  Follow-up in 1 months.   Rushie Chestnut, PA-C 7/11/202410:08 AM

## 2022-08-18 ENCOUNTER — Encounter: Payer: Self-pay | Admitting: Nurse Practitioner

## 2022-08-18 ENCOUNTER — Ambulatory Visit: Payer: Medicare Other | Attending: Nurse Practitioner | Admitting: Nurse Practitioner

## 2022-08-18 ENCOUNTER — Telehealth: Payer: Self-pay | Admitting: Nurse Practitioner

## 2022-08-18 VITALS — BP 124/72 | HR 67 | Ht 63.0 in | Wt 148.4 lb

## 2022-08-18 DIAGNOSIS — I25111 Atherosclerotic heart disease of native coronary artery with angina pectoris with documented spasm: Secondary | ICD-10-CM | POA: Diagnosis not present

## 2022-08-18 DIAGNOSIS — M546 Pain in thoracic spine: Secondary | ICD-10-CM | POA: Diagnosis not present

## 2022-08-18 DIAGNOSIS — R0602 Shortness of breath: Secondary | ICD-10-CM | POA: Diagnosis not present

## 2022-08-18 DIAGNOSIS — R5383 Other fatigue: Secondary | ICD-10-CM | POA: Diagnosis not present

## 2022-08-18 DIAGNOSIS — C801 Malignant (primary) neoplasm, unspecified: Secondary | ICD-10-CM

## 2022-08-18 DIAGNOSIS — I471 Supraventricular tachycardia, unspecified: Secondary | ICD-10-CM | POA: Diagnosis not present

## 2022-08-18 DIAGNOSIS — I10 Essential (primary) hypertension: Secondary | ICD-10-CM

## 2022-08-18 DIAGNOSIS — D508 Other iron deficiency anemias: Secondary | ICD-10-CM

## 2022-08-18 MED ORDER — VERAPAMIL HCL ER 180 MG PO TBCR: 180.0000 mg | EXTENDED_RELEASE_TABLET | Freq: Every day | ORAL | 3 refills | Status: DC

## 2022-08-18 NOTE — Progress Notes (Signed)
Office Visit    Patient Name: Amy Black Date of Encounter: 08/18/2022  Primary Care Provider:  Mechele Claude, MD Primary Cardiologist:  Rollene Rotunda, MD  Chief Complaint    82 year old female with a history of minimal CAD, coronary artery spasm, palpitations/PSVT, hypertension, chronic fatigue, iron deficiency anemia, adenosquamous carcinoma, transverse myelitis, IBS, anxiety, and GERD who presents for follow-up related to chest pain/back pain.  Past Medical History    Past Medical History:  Diagnosis Date   Adenocarcinoma (HCC)    LEFT LEG   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 minimal CAD by cath in 2016 with presumed spasm (20% mLAD, 20% D2).   Cataract    bilateral cataracts removed   Coronary artery spasm (HCC)    Diverticulosis of colon (without mention of hemorrhage)    Femoral neck fracture, right, closed, initial encounter 10/04/2015   GERD (gastroesophageal reflux disease)    hepatic cyst    History of nuclear stress test 04/2011   lexiscan; normal study, no significant ischemia, low risk; 2015 - normal   HTN (hypertension)    PT. DENIES   Hyperlipidemia    Insomnia    Irritable bowel syndrome    Transverse myelitis (HCC)    Vitamin D deficiency    Past Surgical History:  Procedure Laterality Date   ABDOMINAL HYSTERECTOMY Bilateral    CARDIAC CATHETERIZATION     coronary spasm   CARDIAC CATHETERIZATION N/A 08/28/2014   Procedure: Left Heart Cath and Coronary Angiography;  Surgeon: Corky Crafts, MD;  Location: [facility] INVASIVE CV LAB;  Service: Cardiovascular;  Laterality: N/A;   COLONOSCOPY     DIAGNOSTIC LAPAROSCOPY     LYSIS OF ADHESION     PELVIC LAPAROSCOPY  1992   RSO, AND LSO ON 2007   RESECTION SOFT TISSUE TUMOR LEG / ANKLE RADICAL  2004   leg lesion resection & radiation   SALPINGOOPHORECTOMY Left    TOTAL HIP ARTHROPLASTY Right 10/04/2015    Procedure: TOTAL HIP ARTHROPLASTY ANTERIOR APPROACH;  Surgeon: Samson Frederic, MD;  Location: [facility] OR;  Service: Orthopedics;  Laterality: Right;   TRANSTHORACIC ECHOCARDIOGRAM  07/2012   LV cavity size mildly reduced, normal wall motion; MV with calcified annulus and mild MR; LA mildly dilated; atrial septum with increased thickness - lipomatous hypertrophy; RV systolic pressure increased (borderline pulm HTN)   UPPER GASTROINTESTINAL ENDOSCOPY      Allergies  Allergies  Allergen Reactions   Iodine Itching   Ivp Dye [Iodinated Contrast Media] Itching   Amlodipine Other (See Comments)    headaches   Lexapro [Escitalopram Oxalate] Other (See Comments)    Drunk, High feeling   Cymbalta [Duloxetine Hcl] Other (See Comments)    sleepiness   Gabapentin Other (See Comments)    sleepiness   Zanaflex [Tizanidine Hcl] Other (See Comments)    Very drunk feeling next day     Labs/Other Studies Reviewed    The following studies were reviewed today:  Cardiac Studies & Procedures   CARDIAC CATHETERIZATION  CARDIAC CATHETERIZATION 08/28/2014  Narrative  The left ventricular systolic function is normal.  Minimal CAD.  Continue preventive therapy.  F/u with Dr. Antoine Poche.  Findings Coronary Findings Diagnostic  Dominance: Right  Left Anterior Descending  First Diagonal Branch The vessel is small in size.  Second Diagonal Branch  Third [employer] The vessel is  small in size.  [employer] The vessel is small .  Intervention  No interventions have been documented.   STRESS TESTS  NM MYOCAR MULTI W/SPECT W 02/25/2009     MONITORS  LONG TERM MONITOR (3-14 DAYS) 07/01/2020  Narrative  Patient had a min HR of 53 bpm, max HR of 169 bpm, and avg HR of 80 bpm. Predominant underlying rhythm was Sinus Rhythm. 30 Supraventricular Tachycardia runs occurred, the run with the fastest interval lasting 5 beats with a max rate of 169 bpm, the longest lasting 6 beats with an  avg rate of 130 bpm. Some episode(s) of Supraventricular Tachycardia may be possible Atrial Tachycardia with variable block. Isolated SVEs were occasional (1.2%, 15640), SVE Couplets were rare (<1.0%, 1486), and SVE Triplets were rare (<1.0%, 144). Isolated VEs were rare (<1.0%), and no VE Couplets or VE Triplets were present.  Normal sinus rhythm Brief runs of SVT Longest run was 6 beats. No runs of ventricular tachycardia No sustained arrhythmias.          Recent Labs: 03/03/2022: TSH 0.039 05/30/2022: ALT 13; BUN 11; Creatinine 0.84; Potassium 4.1; Sodium 135 08/17/2022: Hemoglobin 11.3; Platelet Count 355  Recent Lipid Panel    Component Value Date/Time   CHOL 177 06/07/2021 1452   CHOL 203 (H) 08/29/2012 1316   TRIG 96 06/07/2021 1452   TRIG 131 08/29/2012 1316   HDL 68 06/07/2021 1452   HDL 68 08/29/2012 1316   CHOLHDL 2.6 06/07/2021 1452   LDLCALC 92 06/07/2021 1452   LDLCALC 109 (H) 08/29/2012 1316    History of Present Illness   82 year old female with the above past medical history including minimal CAD, coronary artery spasm, palpitations/PSVT, hypertension, chronic fatigue, iron deficiency anemia, adenosquamous carcinoma, transverse myelitis, IBS, anxiety, and GERD.  She has a history of presumed coronary artery spasm.  Cardiac catheterization in 2016 showed minimal CAD, 20% disease in the LAD, normal LVEF.  She has had recurrent chest pain and has been treated with increased doses of Imdur.  She  also experienced improvement in her symptoms with transition from verapamil to Cardizem.  Cardiac monitor in 2022 in the setting of weakness, presyncope, showed runs of SVT but did not correlate with symptoms.  She follows with hematology/oncology for history of iron deficiency anemia, adenosquamous carcinoma.  She was last seen in office on 03/03/2022 and was stable overall from a cardiac standpoint.  She did note increased chest discomfort.  She took nitroglycerin without  improvement.  Imdur was increased to 120 mg daily.  She did note chronic fatigue.  CBC was stable, TSH was low, T3, T4 were stable.  She saw her oncologist on 08/17/2022 and noted back pain, arm pain.  She was advised to follow-up with cardiology as an outpatient.  She presents today for follow-up.  Since her last visit been stable overall from a cardiac standpoint.  She does note approximately a 73-month history of intermittent back pain with associated shortness of breath.  Her symptoms occur mostly at rest.  She describes the sensation as a stinging and burning, not similar to prior coronary vasospasm.  Her symptoms can last for up to an hour at a time and resolve spontaneously.  She notes that she can go weeks without having any symptoms.  She has not taken nitroglycerin.  She denies any chest pain, palpitations, dizziness, presyncope, syncope, edema, PND, orthopnea, weight gain.  Other than her intermittent back pain, she reports feeling well.  Home Medications  Current Outpatient Medications  Medication Sig Dispense Refill   ALPRAZolam (XANAX) 0.5 MG tablet Take 1 tablet (0.5 mg total) by mouth 3 (three) times daily. 90 tablet 5   cetirizine (ZYRTEC) 10 MG tablet Take 1 tablet (10 mg total) by mouth daily. 30 tablet 11   cyclobenzaprine (FLEXERIL) 5 MG tablet TAKE ONE TABLET BY MOUTH THREE TIMES DAILY AS NEEDED FOR MUSCLE SPASMS. 90 tablet 5   hydrOXYzine (ATARAX) 10 MG tablet TAKE 1 TABLET BY MOUTH TWICE DAILY. 60 tablet 0   Hyoscyamine Sulfate SL (LEVSIN/SL) 0.125 MG SUBL Place 0.125 mg under the tongue every 4 (four) hours as needed (abd cramping and diarrhea). 120 tablet 5   isosorbide mononitrate (IMDUR) 120 MG 24 hr tablet Take 1 tablet (120 mg total) by mouth daily. 90 tablet 3   lidocaine (XYLOCAINE) 5 % ointment Apply 1 application. topically as needed. 35.44 g 0   mometasone (ELOCON) 0.1 % cream Apply 1 Application topically daily. To affected eye brow area 45 g 5   nitroGLYCERIN  (NITROSTAT) 0.4 MG SL tablet DISSOLVE ONE TABLET UNDER TONGUE EVERY 5 MINUTES UP TO 3 DOSES AS NEEDED FOR CHEST PAIN 25 tablet 11   traMADol (ULTRAM) 50 MG tablet TAKE ONE TABLET BY MOUTH EVERY TWELVE HOURS AS NEEDED. 60 tablet 1   triamterene-hydrochlorothiazide (DYAZIDE) 37.5-25 MG capsule TAKE ONE CAPSULE BY MOUTH 3 TIMES PER WEEK 90 capsule 3   verapamil (CALAN-SR) 180 MG CR tablet Take 1 tablet (180 mg total) by mouth at bedtime. 90 tablet 3   ALLERGY RELIEF 10 MG tablet TAKE ONE TABLET BY MOUTH DAILY (Patient not taking: Reported on 08/18/2022) 30 tablet 11   diphenhydrAMINE (BENADRYL) 50 MG tablet Take 1 tablet by mouth one hour prior to IV contrast (Patient not taking: Reported on 08/18/2022) 30 tablet 0   predniSONE (DELTASONE) 50 MG tablet Take 1 tablet by mouth 13 hours, 7 hours, and 1 hour prior to study (Patient not taking: Reported on 08/18/2022) 3 tablet 0   No current facility-administered medications for this visit.     Review of Systems    She denies chest pain, palpitations, pnd, orthopnea, n, v, dizziness, syncope, edema, weight gain, or early satiety. All other systems reviewed and are otherwise negative except as noted above.   Physical Exam    VS:  BP 124/72   Pulse 67   Ht 5\' 3"  (1.6 m)   Wt 148 lb 6.4 oz (67.3 kg)   SpO2 98%   BMI 26.29 kg/m  GEN: Well nourished, well developed, in no acute distress. HEENT: normal. Neck: Supple, no JVD, carotid bruits, or masses. Cardiac: RRR, no murmurs, rubs, or gallops. No clubbing, cyanosis, edema.  Radials/DP/PT 2+ and equal bilaterally.  Respiratory:  Respirations regular and unlabored, clear to auscultation bilaterally. GI: Soft, nontender, nondistended, BS + x 4. MS: no deformity or atrophy. Skin: warm and dry, no rash. Neuro:  Strength and sensation are intact. Psych: Normal affect.  Accessory Clinical Findings    ECG personally reviewed by me today -    No EKG in office today.   Lab Results  Component Value Date    WBC 4.8 08/17/2022   HGB 11.3 (L) 08/17/2022   HCT 35.2 (L) 08/17/2022   MCV 95.1 08/17/2022   PLT 355 08/17/2022   Lab Results  Component Value Date   CREATININE 0.84 05/30/2022   BUN 11 05/30/2022   NA 135 05/30/2022   K 4.1 05/30/2022   CL 101 05/30/2022  CO2 27 05/30/2022   Lab Results  Component Value Date   ALT 13 05/30/2022   AST 18 05/30/2022   ALKPHOS 83 05/30/2022   BILITOT 0.2 (L) 05/30/2022   Lab Results  Component Value Date   CHOL 177 06/07/2021   HDL 68 06/07/2021   LDLCALC 92 06/07/2021   TRIG 96 06/07/2021   CHOLHDL 2.6 06/07/2021    No results found for: "HGBA1C"  Assessment & Plan    1. Minimal CAD/coronary artery spasm/chest pain/back pain/shortness of breath: Cardiac catheterization in 2016 showed minimal CAD, 20% disease in the LAD, normal LVEF.  She has a history of coronary artery vasospasm, treated with increased doses of Imdur.  She notes a 10-month history of intermittent back pain which she describes as a stinging/burning sensation that can last for up to an hour at a time.  She has associated shortness of breath, denies chest pain.  Her symptoms resolved spontaneously.  She can go weeks without experiencing symptoms.  She has not tried taking nitroglycerin.  We discussed possible ischemic evaluation including coronary CT angiogram, Lexiscan Myoview, cardiac PET stress test.  She does have a history of contrast allergy.  She states she would prefer to have the "same stress test" she had before.  Therefore, through shared decision-making, we will proceed with Lexiscan Myoview.  Reviewed ED precautions.  Will increase verapamil to 180 mg daily.  Continue Imdur.  Informed Consent   Shared Decision Making/Informed Consent The risks [chest pain, shortness of breath, cardiac arrhythmias, dizziness, blood pressure fluctuations, myocardial infarction, stroke/transient ischemic attack, nausea, vomiting, allergic reaction, radiation exposure, metallic taste  sensation and life-threatening complications (estimated to be 1 in 10,000)], benefits (risk stratification, diagnosing coronary artery disease, treatment guidance) and alternatives of a nuclear stress test were discussed in detail with Ms. Fangman and she agrees to proceed.     2. Palpitations/PSVT:  Cardiac monitor in 2022 in the setting of weakness, presyncope, showed runs of SVT but did not correlate with symptoms.  He denies any recent palpitations.  Continue verapamil as above.  3. Hypertension: BP well controlled. Continue current antihypertensive regimen.   4. Fatigue/iron deficiency anemia/adenosquamous carcinoma: Following with hematology/oncology.  5. Disposition: Follow-up in 6 weeks.       Joylene Grapes, NP 08/18/2022, 11:12 AM

## 2022-08-18 NOTE — Addendum Note (Signed)
Addended by: Lamar Benes on: 08/18/2022 11:18 AM   Modules accepted: Orders

## 2022-08-18 NOTE — Telephone Encounter (Signed)
Pt c/o medication issue:  1. Name of Medication: verapamil (CALAN-SR) 180 MG CR tablet   2. How are you currently taking this medication (dosage and times per day)? N/A  3. Are you having a reaction (difficulty breathing--STAT)? No  4. What is your medication issue? Pharmacy advised patient they have not received prescription.

## 2022-08-18 NOTE — Telephone Encounter (Signed)
Left voicemail for patient to return call to office. 

## 2022-08-18 NOTE — Patient Instructions (Signed)
Medication Instructions:  Increase Verapamil 180 mg daily   *If you need a refill on your cardiac medications before your next appointment, please call your pharmacy*   Lab Work: NONE ordered at this time of appointment    Testing/Procedures: Your physician has requested that you have a lexiscan myoview. For further information please visit [email or web address] Please follow instruction sheet, as given.   How to Prepare for Your Myoview Test (stress test):  1.Please do not take these medications before your test:  (please note if this is an exercise test pt should hold beta blocker prior) 2.Your remaining medications may be taken with water. 3.Nothing to eat or drink, except water, 4 hours prior to arrival time.  NO caffeine/decaffeinated products, or chocolate 12 hours prior to arrival. 4.Ladies, please do not wear dresses.  Skirts or pants are approprate, please wear a short sleeve shirt. 5.NO perfume, cologne or lotion 6.Wear comfortable walking shoes.  NO HEELS! 7.Total time is 3 to 4 hours; you may want to bring reading material for the waiting time. 8.Please report to [facility] for your test  What to expect after you arrive:  Once you arrive and check in for your appointment an IV will be started in your arm.  Then the Technoligist will inject a small amount of radioactive tracer.  There will be a 1 hour waiting period after this injection.  A series of pictures will be taken of your heart following this waiting period.  You will be prepped for the stress portion of the test.  During the stress portion of your test you will either walk on a treadmill or receive a small, safe amount of radioactive tracer injected in your IV.  After the stress portion, there is a short rest period during which time your heart and blood pressure will be monitored.  After the short rest period the Technologist will begin your second set of pictures.  Your doctor will inform you of your test results  within 7-10 business days.  In preparation for your appointment, medication and supplies will be purchased.  Appointment availability is limited, so if you need to cancel or reschedule please call the office at [phone] 24 hours in advance to avoid a cancellation fee of $100.00    Follow-Up: At [facility], you and your health needs are our priority.  As part of our continuing mission to provide you with exceptional heart care, we have created designated Provider Care Teams.  These Care Teams include your primary Cardiologist (physician) and Advanced Practice Providers (APPs -  Physician Assistants and Nurse Practitioners) who all work together to provide you with the care you need, when you need it.  We recommend signing up for the patient portal called "MyChart".  Sign up information is provided on this After Visit Summary.  MyChart is used to connect with patients for Virtual Visits (Telemedicine).  Patients are able to view lab/test results, encounter notes, upcoming appointments, etc.  Non-urgent messages can be sent to your provider as well.   To learn more about what you can do with MyChart, go to [email or web address].    Your next appointment:   6 week(s)  Provider:   Bernadene Person, NP

## 2022-08-21 ENCOUNTER — Telehealth (HOSPITAL_COMMUNITY): Payer: Self-pay | Admitting: *Deleted

## 2022-08-21 NOTE — Telephone Encounter (Signed)
Spoke to the patient,  she did pick up Verapamil form the pharmacy. Pt did not have any additional questions.

## 2022-08-21 NOTE — Telephone Encounter (Signed)
 Patient given detailed instructions per Myocardial Perfusion Study Information Sheet for the test on 08/23/22 Patient notified to arrive 15 minutes early and that it is imperative to arrive on time for appointment to keep from having the test rescheduled.  If you need to cancel or reschedule your appointment, please call the office within 24 hours of your appointment. . Patient verbalized understanding. Ricky Ala

## 2022-08-22 ENCOUNTER — Ambulatory Visit: Payer: Medicare Other | Admitting: Family Medicine

## 2022-08-23 ENCOUNTER — Ambulatory Visit (HOSPITAL_COMMUNITY): Payer: Medicare Other | Attending: Nurse Practitioner

## 2022-08-23 DIAGNOSIS — R0602 Shortness of breath: Secondary | ICD-10-CM | POA: Insufficient documentation

## 2022-08-23 DIAGNOSIS — I25111 Atherosclerotic heart disease of native coronary artery with angina pectoris with documented spasm: Secondary | ICD-10-CM | POA: Diagnosis not present

## 2022-08-23 DIAGNOSIS — M546 Pain in thoracic spine: Secondary | ICD-10-CM | POA: Diagnosis not present

## 2022-08-23 LAB — MYOCARDIAL PERFUSION IMAGING
Estimated workload: 1
Exercise duration (min): 1 min
Exercise duration (sec): 0 s
LV dias vol: 78 mL (ref 46–106)
LV sys vol: 29 mL
MPHR: 141 {beats}/min
Nuc Stress EF: 63 %
Peak HR: 96 {beats}/min
Percent HR: 68 %
Rest HR: 72 {beats}/min
Rest Nuclear Isotope Dose: 10.3 mCi
SDS: 1
SRS: 2
SSS: 3
ST Depression (mm): 0 mm
Stress Nuclear Isotope Dose: 32.6 mCi
TID: 0.83

## 2022-08-23 MED ORDER — REGADENOSON 0.4 MG/5ML IV SOLN
0.4000 mg | Freq: Once | INTRAVENOUS | Status: AC
Start: 2022-08-23 — End: 2022-08-23
  Administered 2022-08-23: 0.4 mg via INTRAVENOUS

## 2022-08-23 MED ORDER — TECHNETIUM TC 99M TETROFOSMIN IV KIT
32.6000 | PACK | Freq: Once | INTRAVENOUS | Status: AC | PRN
  Administered 2022-08-23: 32.6 via INTRAVENOUS

## 2022-08-23 MED ORDER — TECHNETIUM TC 99M TETROFOSMIN IV KIT
10.3000 | PACK | Freq: Once | INTRAVENOUS | Status: AC | PRN
  Administered 2022-08-23: 10.3 via INTRAVENOUS

## 2022-08-25 ENCOUNTER — Telehealth: Payer: Self-pay

## 2022-08-25 NOTE — Telephone Encounter (Signed)
Spoke with pt. Pt was notified of stress test results. Pt will continue current medication and f/u as planned.

## 2022-08-25 NOTE — Progress Notes (Signed)
Spoke with pt. Pt was notified of stress test results. Pt will continue current medication and f/u as planned.

## 2022-08-30 ENCOUNTER — Telehealth: Payer: Self-pay | Admitting: Family Medicine

## 2022-08-30 NOTE — Telephone Encounter (Signed)
She is welcome to switch to Amy Black if Amy Black is okay with it.

## 2022-08-30 NOTE — Telephone Encounter (Signed)
PT wants to "get rid of Dr. Darlyn Read", she wants to switch to [facility]. Pt states Mitzi was supposed to help her with this last week and she says she has been with him for 20 some years and she is just a number to him and sometimes people get tired of being used and does not understand why he wants to keep her he does not care about her and he is not that good of a doctor. Pt is aware she can not switch providers due to being on a CSA, still wants message to be sent to providers and when told pt this she asked what would she do if Dr. Darlyn Read died and I informed pt that is a different situation.Pt also states her insurance told her she can see whoever she wants to see.

## 2022-09-22 ENCOUNTER — Encounter: Payer: Self-pay | Admitting: Family

## 2022-10-02 ENCOUNTER — Encounter: Payer: Self-pay | Admitting: Nurse Practitioner

## 2022-10-02 ENCOUNTER — Ambulatory Visit: Payer: Medicare Other | Admitting: Nurse Practitioner

## 2022-10-02 VITALS — BP 131/68 | HR 59 | Temp 97.3°F | Ht 63.0 in | Wt 151.0 lb

## 2022-10-02 DIAGNOSIS — J302 Other seasonal allergic rhinitis: Secondary | ICD-10-CM

## 2022-10-02 MED ORDER — FLUTICASONE PROPIONATE 50 MCG/ACT NA SUSP: 2.0000 | Freq: Every day | NASAL | 6 refills | Status: DC

## 2022-10-02 NOTE — Progress Notes (Signed)
Acute Office Visit  Subjective:     Patient ID: Amy Black, female    DOB: [birth year], 82 y.o.   MRN: [id number]  Chief Complaint  Patient presents with   Sinusitis    HPI Amy Black is a 82 y.o. female who complains of congestion for 2 days. She denies a history of anorexia, chest pain, chills, dizziness, fatigue, fevers, nausea, shortness of breath, vomiting, weight loss, wheezing, cough, and sputum production and denies a history of asthma. Patient denies smoke cigarettes.   Active Ambulatory Problems    Diagnosis Date Noted   Coronary atherosclerosis 12/03/2009   GERD 12/03/2009   Anxiety 05/19/2010   Adenocarcinoma of unknown primary (HCC) 02/06/2011   HTN (hypertension) 08/29/2012   Arthritis of neck 08/29/2012   Transverse myelitis (HCC) 10/10/2012   Coronary artery spasm (HCC) 11/19/2012   Baker's cyst of knee 02/14/2013   Urinary retention 02/20/2012   Lower extremity weakness 02/20/2012   Thrombocytosis 07/23/2015   Palpitations 11/14/2018   IDA (iron deficiency anemia) 05/07/2019   Herpes zoster dermatitis 02/16/2020   Other headache syndrome 07/03/2020   Subacute frontal sinusitis 08/19/2020   Weakness 01/03/2021   Fecal smearing 02/14/2021   Vitamin D deficiency 11/11/2021   Seasonal allergic rhinitis 10/02/2022   Resolved Ambulatory Problems    Diagnosis Date Noted   ADENOCARCINOMA 12/02/2009   ANGINA, UNSTABLE 12/02/2009   DIVERTICULOSIS, COLON 12/02/2009   Irritable bowel syndrome 12/02/2009   HEPATIC CYST 12/02/2009   Diarrhea 12/02/2009   Chest pain 08/16/2010   Irritable bowel disease 08/16/2010   Dysphagia 08/21/2010   Gastritis 08/29/2010   Duodenal nodule 08/29/2010   Flaccid paralysis of legs (HCC) 02/10/2012   Urinary retention 02/10/2012   Weakness of right leg 02/14/2012   Conjunctivitis 05/28/2012   CAD (coronary artery disease) 08/29/2012   HLD (hyperlipidemia) 08/29/2012   Pain in joint, ankle and foot 02/14/2013    Pedal edema 02/14/2013   Rhinitis 02/14/2013   Chest pain 11/28/2013   Exertional angina    Abdominal pain 12/14/2014   Closed right hip fracture (HCC) 10/03/2015   Femoral neck fracture, right, closed, initial encounter 10/04/2015   Acute back pain 01/09/2019   Dizziness 01/09/2019   Past Medical History:  Diagnosis Date   Adenocarcinoma (HCC)    Adenosquamous carcinoma    Allergy    Cataract    Diverticulosis of colon (without mention of hemorrhage)    GERD (gastroesophageal reflux disease)    History of nuclear stress test 04/2011   Hyperlipidemia    Insomnia    Vitamin D deficiency     ROS Negative unless indicated in HPI    10/02/2022   10:34 AM 08/07/2022    1:53 PM 03/22/2022   10:20 AM  PHQ9 SCORE ONLY  PHQ-9 Total Score 0 0 0       Objective:    BP 131/68   Pulse (!) 59   Temp (!) 97.3 F (36.3 C) (Temporal)   Ht 5\' 3"  (1.6 m)   Wt 151 lb (68.5 kg)   SpO2 99%   BMI 26.75 kg/m  BP Readings from Last 3 Encounters:  10/02/22 131/68  08/18/22 124/72  08/17/22 (!) 147/73   Wt Readings from Last 3 Encounters:  10/02/22 151 lb (68.5 kg)  08/23/22 148 lb (67.1 kg)  08/18/22 148 lb 6.4 oz (67.3 kg)      Physical Exam She appears well, vital signs are as noted. Ears normal.  Throat and pharynx normal.  Neck supple. No adenopathy in the neck. Nose is congested. Sinuses non tender. The chest is clear, without wheezes or rales. No results found for any visits on 10/02/22.      Assessment & Plan:  Seasonal allergic rhinitis, unspecified trigger -     Fluticasone Propionate; Place 2 sprays into both nostrils daily.  Dispense: 16 g; Refill: 6    ASSESSMENT:  viral upper respiratory illness, sinusitis, and allergic rhinitis  PLAN: Continue Zyrtec as already prescribed Flonase 1 spray on each nostrils daily Increase hydration, rest, return office visit prn if symptoms persist or worsen  Call or return to clinic prn if these symptoms worsen or fail to  improve as anticipated.    The above assessment and management plan was discussed with the patient. The patient verbalized understanding of and has agreed to the management plan. Patient is aware to call the clinic if they develop any new symptoms or if symptoms persist or worsen. Patient is aware when to return to the clinic for a follow-up visit. Patient educated on when it is appropriate to go to the emergency department.  Return if symptoms worsen or fail to improve.   Arrie Aran Santa Lighter, DNP [facility] [address] [place], [place] [address] [phone]

## 2022-10-04 ENCOUNTER — Encounter: Payer: Self-pay | Admitting: Nurse Practitioner

## 2022-10-04 ENCOUNTER — Ambulatory Visit: Payer: Medicare Other | Attending: Nurse Practitioner | Admitting: Nurse Practitioner

## 2022-10-04 VITALS — BP 128/66 | HR 77 | Ht 63.0 in | Wt 157.6 lb

## 2022-10-04 DIAGNOSIS — I10 Essential (primary) hypertension: Secondary | ICD-10-CM | POA: Diagnosis not present

## 2022-10-04 DIAGNOSIS — R0602 Shortness of breath: Secondary | ICD-10-CM

## 2022-10-04 DIAGNOSIS — R5383 Other fatigue: Secondary | ICD-10-CM | POA: Diagnosis not present

## 2022-10-04 DIAGNOSIS — I25111 Atherosclerotic heart disease of native coronary artery with angina pectoris with documented spasm: Secondary | ICD-10-CM | POA: Diagnosis not present

## 2022-10-04 DIAGNOSIS — I201 Angina pectoris with documented spasm: Secondary | ICD-10-CM

## 2022-10-04 DIAGNOSIS — D508 Other iron deficiency anemias: Secondary | ICD-10-CM

## 2022-10-04 DIAGNOSIS — I471 Supraventricular tachycardia, unspecified: Secondary | ICD-10-CM

## 2022-10-04 MED ORDER — TRIAMTERENE-HCTZ 37.5-25 MG PO CAPS: ORAL_CAPSULE | ORAL | 3 refills | Status: DC

## 2022-10-04 NOTE — Patient Instructions (Signed)
.  Medication Instructions:  Refill Dyazide 37.5 - 25MG  Your physician recommends that you continue on your current medications as directed. Please refer to the Current Medication list given to you today.  *If you need a refill on your cardiac medications before your next appointment, please call your pharmacy*   Lab Work: None ordered at the time of this appointment If you have labs (blood work) drawn today and your tests are completely normal, you will receive your results only by: MyChart Message (if you have MyChart) OR A paper copy in the mail If you have any lab test that is abnormal or we need to change your treatment, we will call you to review the results.   Testing/Procedures: None ordered at the time of this appointment   Follow-Up: At [facility], you and your health needs are our priority.  As part of our continuing mission to provide you with exceptional heart care, we have created designated Provider Care Teams.  These Care Teams include your primary Cardiologist (physician) and Advanced Practice Providers (APPs -  Physician Assistants and Nurse Practitioners) who all work together to provide you with the care you need, when you need it.  We recommend signing up for the patient portal called "MyChart".  Sign up information is provided on this After Visit Summary.  MyChart is used to connect with patients for Virtual Visits (Telemedicine).  Patients are able to view lab/test results, encounter notes, upcoming appointments, etc.  Non-urgent messages can be sent to your provider as well.   To learn more about what you can do with MyChart, go to [email or web address].    Your next appointment:   6 month(s)  Provider:   Rollene Rotunda, MD

## 2022-10-04 NOTE — Progress Notes (Signed)
Office Visit    Patient Name: Amy Black Date of Encounter: 10/04/2022  Primary Care Provider:  Mechele Claude, MD Primary Cardiologist:  Rollene Rotunda, MD  Chief Complaint    82 year old female with a history of minimal CAD, coronary artery spasm, palpitations/PSVT, hypertension, chronic fatigue, iron deficiency anemia, adenosquamous carcinoma, transverse myelitis, IBS, anxiety, and GERD who presents for follow-up related to chest pain/back pain.   Past Medical History    Past Medical History:  Diagnosis Date   Adenocarcinoma (HCC)    LEFT LEG   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 minimal CAD by cath in 2016 with presumed spasm (20% mLAD, 20% D2).   Cataract    bilateral cataracts removed   Coronary artery spasm (HCC)    Diverticulosis of colon (without mention of hemorrhage)    Femoral neck fracture, right, closed, initial encounter 10/04/2015   GERD (gastroesophageal reflux disease)    hepatic cyst    History of nuclear stress test 04/2011   lexiscan; normal study, no significant ischemia, low risk; 2015 - normal   HTN (hypertension)    PT. DENIES   Hyperlipidemia    Insomnia    Irritable bowel syndrome    Transverse myelitis (HCC)    Vitamin D deficiency    Past Surgical History:  Procedure Laterality Date   ABDOMINAL HYSTERECTOMY Bilateral    CARDIAC CATHETERIZATION     coronary spasm   CARDIAC CATHETERIZATION N/A 08/28/2014   Procedure: Left Heart Cath and Coronary Angiography;  Surgeon: Corky Crafts, MD;  Location: [facility] INVASIVE CV LAB;  Service: Cardiovascular;  Laterality: N/A;   COLONOSCOPY     DIAGNOSTIC LAPAROSCOPY     LYSIS OF ADHESION     PELVIC LAPAROSCOPY  1992   RSO, AND LSO ON 2007   RESECTION SOFT TISSUE TUMOR LEG / ANKLE RADICAL  2004   leg lesion resection & radiation   SALPINGOOPHORECTOMY Left    TOTAL HIP ARTHROPLASTY Right 10/04/2015    Procedure: TOTAL HIP ARTHROPLASTY ANTERIOR APPROACH;  Surgeon: Samson Frederic, MD;  Location: [facility] OR;  Service: Orthopedics;  Laterality: Right;   TRANSTHORACIC ECHOCARDIOGRAM  07/2012   LV cavity size mildly reduced, normal wall motion; MV with calcified annulus and mild MR; LA mildly dilated; atrial septum with increased thickness - lipomatous hypertrophy; RV systolic pressure increased (borderline pulm HTN)   UPPER GASTROINTESTINAL ENDOSCOPY      Allergies  Allergies  Allergen Reactions   Iodine Itching   Ivp Dye [Iodinated Contrast Media] Itching   Amlodipine Other (See Comments)    headaches   Lexapro [Escitalopram Oxalate] Other (See Comments)    Drunk, High feeling   Cymbalta [Duloxetine Hcl] Other (See Comments)    sleepiness   Gabapentin Other (See Comments)    sleepiness   Zanaflex [Tizanidine Hcl] Other (See Comments)    Very drunk feeling next day     Labs/Other Studies Reviewed    The following studies were reviewed today:  Cardiac Studies & Procedures   CARDIAC CATHETERIZATION  CARDIAC CATHETERIZATION 08/28/2014  Narrative  The left ventricular systolic function is normal.  Minimal CAD.  Continue preventive therapy.  F/u with Dr. Antoine Poche.  Findings Coronary Findings Diagnostic  Dominance: Right  Left Anterior Descending  First Diagonal Branch The vessel is small in size.  Second Lobbyist The vessel  is small in size.  Ramus Intermedius The vessel is small .  Intervention  No interventions have been documented.   STRESS TESTS  MYOCARDIAL PERFUSION IMAGING 08/23/2022  Narrative   The study is normal. The study is low risk.   No ST deviation was noted.   Left ventricular function is normal. Nuclear stress EF: 63%. The left ventricular ejection fraction is normal (55-65%). End diastolic cavity size is normal. End systolic cavity size is normal.  Normal resting and stress perfusion. No ischemia or infarction  EF 63%     MONITORS  LONG TERM MONITOR (3-14 DAYS) 07/01/2020  Narrative  Patient had a min HR of 53 bpm, max HR of 169 bpm, and avg HR of 80 bpm. Predominant underlying rhythm was Sinus Rhythm. 30 Supraventricular Tachycardia runs occurred, the run with the fastest interval lasting 5 beats with a max rate of 169 bpm, the longest lasting 6 beats with an avg rate of 130 bpm. Some episode(s) of Supraventricular Tachycardia may be possible Atrial Tachycardia with variable block. Isolated SVEs were occasional (1.2%, 15640), SVE Couplets were rare (<1.0%, 1486), and SVE Triplets were rare (<1.0%, 144). Isolated VEs were rare (<1.0%), and no VE Couplets or VE Triplets were present.  Normal sinus rhythm Brief runs of SVT Longest run was 6 beats. No runs of ventricular tachycardia No sustained arrhythmias.          Recent Labs: 03/03/2022: TSH 0.039 05/30/2022: ALT 13; BUN 11; Creatinine 0.84; Potassium 4.1; Sodium 135 08/17/2022: Hemoglobin 11.3; Platelet Count 355  Recent Lipid Panel    Component Value Date/Time   CHOL 177 06/07/2021 1452   CHOL 203 (H) 08/29/2012 1316   TRIG 96 06/07/2021 1452   TRIG 131 08/29/2012 1316   HDL 68 06/07/2021 1452   HDL 68 08/29/2012 1316   CHOLHDL 2.6 06/07/2021 1452   LDLCALC 92 06/07/2021 1452   LDLCALC 109 (H) 08/29/2012 1316    History of Present Illness    82 year old female with the above past medical history including minimal CAD, coronary artery spasm, palpitations/PSVT, hypertension, chronic fatigue, iron deficiency anemia, adenosquamous carcinoma, transverse myelitis, IBS, anxiety, and GERD.   She has a history of presumed coronary artery spasm.  Cardiac catheterization in 2016 showed minimal CAD, 20% disease in the LAD, normal LVEF.  She has had recurrent chest pain and has been treated with increased doses of Imdur.  She  also experienced improvement in her symptoms with transition from verapamil to Cardizem.  Cardiac monitor in 2022 in the  setting of weakness, presyncope, showed runs of SVT but did not correlate with symptoms.  She follows with hematology/oncology for history of iron deficiency anemia, adenosquamous carcinoma.  At her follow-up visit in January 2024 she noted increased chest discomfort.  Imdur was increased to 120 mg daily.  She also noted chronic fatigue.  CBC was stable, TSH was low, T3, T4 were stable.  She saw her oncologist on 08/17/2022 and noted back pain, arm pain.  She was advised to follow-up with cardiology as an outpatient.  She was last seen in the office on 08/18/2022 and was stable from a cardiac standpoint.  She noted a 65-month history of intermittent back pain with associated shortness of breath.  Symptoms occurred primarily at rest.  Verapamil was increased.  Lexiscan Myoview in 08/2022 was low risk, no evidence of ischemia.   She presents today for follow-up.  Since her last visit she has done well from a cardiac standpoint.  She denies  any further symptoms of back pain or shortness of breath. She is about to celebrate her 82th birthday. Overall, she reports feeling well.  Home Medications    Current Outpatient Medications  Medication Sig Dispense Refill   ALPRAZolam (XANAX) 0.5 MG tablet Take 1 tablet (0.5 mg total) by mouth 3 (three) times daily. 90 tablet 5   cetirizine (ZYRTEC) 10 MG tablet Take 1 tablet (10 mg total) by mouth daily. 30 tablet 11   cyclobenzaprine (FLEXERIL) 5 MG tablet TAKE ONE TABLET BY MOUTH THREE TIMES DAILY AS NEEDED FOR MUSCLE SPASMS. 90 tablet 5   fluticasone (FLONASE) 50 MCG/ACT nasal spray Place 2 sprays into both nostrils daily. 16 g 6   hydrOXYzine (ATARAX) 10 MG tablet TAKE 1 TABLET BY MOUTH TWICE DAILY. 60 tablet 0   Hyoscyamine Sulfate SL (LEVSIN/SL) 0.125 MG SUBL Place 0.125 mg under the tongue every 4 (four) hours as needed (abd cramping and diarrhea). 120 tablet 5   isosorbide mononitrate (IMDUR) 120 MG 24 hr tablet Take 1 tablet (120 mg total) by mouth daily. 90  tablet 3   lidocaine (XYLOCAINE) 5 % ointment Apply 1 application. topically as needed. 35.44 g 0   mometasone (ELOCON) 0.1 % cream Apply 1 Application topically daily. To affected eye brow area 45 g 5   nitroGLYCERIN (NITROSTAT) 0.4 MG SL tablet DISSOLVE ONE TABLET UNDER TONGUE EVERY 5 MINUTES UP TO 3 DOSES AS NEEDED FOR CHEST PAIN 25 tablet 11   traMADol (ULTRAM) 50 MG tablet TAKE ONE TABLET BY MOUTH EVERY TWELVE HOURS AS NEEDED. 60 tablet 1   verapamil (CALAN-SR) 180 MG CR tablet Take 1 tablet (180 mg total) by mouth at bedtime. 90 tablet 3   ALLERGY RELIEF 10 MG tablet TAKE ONE TABLET BY MOUTH DAILY (Patient not taking: Reported on 10/04/2022) 30 tablet 11   diphenhydrAMINE (BENADRYL) 50 MG tablet Take 1 tablet by mouth one hour prior to IV contrast (Patient not taking: Reported on 10/04/2022) 30 tablet 0   predniSONE (DELTASONE) 50 MG tablet Take 1 tablet by mouth 13 hours, 7 hours, and 1 hour prior to study (Patient not taking: Reported on 10/04/2022) 3 tablet 0   triamterene-hydrochlorothiazide (DYAZIDE) 37.5-25 MG capsule TAKE ONE CAPSULE BY MOUTH 3 TIMES PER WEEK 90 capsule 3   No current facility-administered medications for this visit.     Review of Systems    She denies chest pain, palpitations, dyspnea, pnd, orthopnea, n, v, dizziness, syncope, edema, weight gain, or early satiety. All other systems reviewed and are otherwise negative except as noted above.   Physical Exam    VS:  BP 128/66 (BP Location: Right Arm, Patient Position: Sitting, Cuff Size: Normal)   Pulse 77   Ht 5\' 3"  (1.6 m)   Wt 157 lb 9.6 oz (71.5 kg)   SpO2 96%   BMI 27.92 kg/m  GEN: Well nourished, well developed, in no acute distress. HEENT: normal. Neck: Supple, no JVD, carotid bruits, or masses. Cardiac: RRR, no murmurs, rubs, or gallops. No clubbing, cyanosis, edema.  Radials/DP/PT 2+ and equal bilaterally.  Respiratory:  Respirations regular and unlabored, clear to auscultation bilaterally. GI: Soft,  nontender, nondistended, BS + x 4. MS: no deformity or atrophy. Skin: warm and dry, no rash. Neuro:  Strength and sensation are intact. Psych: Normal affect.  Accessory Clinical Findings    ECG personally reviewed by me today -    - no EKG in office today.    Lab Results  Component Value Date  WBC 4.8 08/17/2022   HGB 11.3 (L) 08/17/2022   HCT 35.2 (L) 08/17/2022   MCV 95.1 08/17/2022   PLT 355 08/17/2022   Lab Results  Component Value Date   CREATININE 0.84 05/30/2022   BUN 11 05/30/2022   NA 135 05/30/2022   K 4.1 05/30/2022   CL 101 05/30/2022   CO2 27 05/30/2022   Lab Results  Component Value Date   ALT 13 05/30/2022   AST 18 05/30/2022   ALKPHOS 83 05/30/2022   BILITOT 0.2 (L) 05/30/2022   Lab Results  Component Value Date   CHOL 177 06/07/2021   HDL 68 06/07/2021   LDLCALC 92 06/07/2021   TRIG 96 06/07/2021   CHOLHDL 2.6 06/07/2021    No results found for: "HGBA1C"  Assessment & Plan   1. Minimal CAD/coronary artery spasm/chest pain/back pain/shortness of breath: Cardiac catheterization in 2016 showed minimal CAD, 20% disease in the LAD, normal LVEF.  She has a history of coronary artery vasospasm, treated with increased doses of Imdur.  At her last visit she noted a 24-month history of intermittent back pain with associated shortness of breath.  Lexiscan Myoview in 08/2022 was negative for ischemia.  Symptoms improved with increased verapamil.  She denies any recurrent symptoms.  Continue Imdur, verapamil, triamterene-HCTZ.   2. Palpitations/PSVT: Cardiac monitor in 2022 in the setting of weakness, presyncope, showed runs of SVT but did not correlate with symptoms.  She denies any recent palpitations. Continue verapamil.   3. Hypertension: BP well controlled. Continue current antihypertensive regimen.    4. Fatigue/iron deficiency anemia/adenosquamous carcinoma: Following with hematology/oncology.   5. Disposition: Follow-up in 6 months with Dr.  Antoine Poche.     Joylene Grapes, NP 10/04/2022, 2:28 PM

## 2022-10-06 ENCOUNTER — Ambulatory Visit (INDEPENDENT_AMBULATORY_CARE_PROVIDER_SITE_OTHER): Payer: Medicare Other | Admitting: Family Medicine

## 2022-10-06 VITALS — BP 105/65 | HR 69 | Temp 97.2°F | Resp 20 | Ht 63.0 in | Wt 150.0 lb

## 2022-10-06 DIAGNOSIS — M62838 Other muscle spasm: Secondary | ICD-10-CM

## 2022-10-06 MED ORDER — BACLOFEN 10 MG PO TABS
5.0000 mg | ORAL_TABLET | Freq: Two times a day (BID) | ORAL | 0 refills | Status: DC | PRN
Start: 2022-10-06 — End: 2023-01-15

## 2022-10-06 NOTE — Progress Notes (Signed)
Subjective: CC: Right foot PCP: Mechele Claude, MD UJW:[id number] Amy Black is a 82 y.o. female presenting to clinic today for:  1.  Right foot pain Patient reports that she had a sudden cramping sensation in her right lower extremity, specifically around the ankle and she notes that all of her toes stood straight out.  This occurred yesterday and has since resolved but she was worried about it.  She has history of transverse myelitis and is treated with Flexeril but she has not found the Flexeril to be especially helpful.  She has had history of intolerance to Zanaflex in the past, gabapentin, Cymbalta.   ROS: Per HPI  Allergies  Allergen Reactions   Iodine Itching   Ivp Dye [Iodinated Contrast Media] Itching   Amlodipine Other (See Comments)    headaches   Lexapro [Escitalopram Oxalate] Other (See Comments)    Drunk, High feeling   Cymbalta [Duloxetine Hcl] Other (See Comments)    sleepiness   Gabapentin Other (See Comments)    sleepiness   Zanaflex [Tizanidine Hcl] Other (See Comments)    Very drunk feeling next day   Past Medical History:  Diagnosis Date   Adenocarcinoma (HCC)    LEFT LEG   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 minimal CAD by cath in 2016 with presumed spasm (20% mLAD, 20% D2).   Cataract    bilateral cataracts removed   Coronary artery spasm (HCC)    Diverticulosis of colon (without mention of hemorrhage)    Femoral neck fracture, right, closed, initial encounter 10/04/2015   GERD (gastroesophageal reflux disease)    hepatic cyst    History of nuclear stress test 04/2011   lexiscan; normal study, no significant ischemia, low risk; 2015 - normal   HTN (hypertension)    PT. DENIES   Hyperlipidemia    Insomnia    Irritable bowel syndrome    Transverse myelitis (HCC)    Vitamin D deficiency     Current Outpatient Medications:    ALLERGY RELIEF 10 MG tablet,  TAKE ONE TABLET BY MOUTH DAILY, Disp: 30 tablet, Rfl: 11   ALPRAZolam (XANAX) 0.5 MG tablet, Take 1 tablet (0.5 mg total) by mouth 3 (three) times daily., Disp: 90 tablet, Rfl: 5   baclofen (LIORESAL) 10 MG tablet, Take 0.5-1 tablets (5-10 mg total) by mouth 2 (two) times daily as needed for muscle spasms (to REPLACE the flexeril)., Disp: 30 each, Rfl: 0   cetirizine (ZYRTEC) 10 MG tablet, Take 1 tablet (10 mg total) by mouth daily., Disp: 30 tablet, Rfl: 11   diphenhydrAMINE (BENADRYL) 50 MG tablet, Take 1 tablet by mouth one hour prior to IV contrast, Disp: 30 tablet, Rfl: 0   fluticasone (FLONASE) 50 MCG/ACT nasal spray, Place 2 sprays into both nostrils daily., Disp: 16 g, Rfl: 6   hydrOXYzine (ATARAX) 10 MG tablet, TAKE 1 TABLET BY MOUTH TWICE DAILY., Disp: 60 tablet, Rfl: 0   Hyoscyamine Sulfate SL (LEVSIN/SL) 0.125 MG SUBL, Place 0.125 mg under the tongue every 4 (four) hours as needed (abd cramping and diarrhea)., Disp: 120 tablet, Rfl: 5   isosorbide mononitrate (IMDUR) 120 MG 24 hr tablet, Take 1 tablet (120 mg total) by mouth daily., Disp: 90 tablet, Rfl: 3   lidocaine (XYLOCAINE) 5 % ointment, Apply 1 application. topically as needed., Disp: 35.44 g, Rfl: 0   mometasone (ELOCON)  0.1 % cream, Apply 1 Application topically daily. To affected eye brow area, Disp: 45 g, Rfl: 5   nitroGLYCERIN (NITROSTAT) 0.4 MG SL tablet, DISSOLVE ONE TABLET UNDER TONGUE EVERY 5 MINUTES UP TO 3 DOSES AS NEEDED FOR CHEST PAIN, Disp: 25 tablet, Rfl: 11   traMADol (ULTRAM) 50 MG tablet, TAKE ONE TABLET BY MOUTH EVERY TWELVE HOURS AS NEEDED., Disp: 60 tablet, Rfl: 1   triamterene-hydrochlorothiazide (DYAZIDE) 37.5-25 MG capsule, TAKE ONE CAPSULE BY MOUTH 3 TIMES PER WEEK, Disp: 90 capsule, Rfl: 3   verapamil (CALAN-SR) 180 MG CR tablet, Take 1 tablet (180 mg total) by mouth at bedtime., Disp: 90 tablet, Rfl: 3   predniSONE (DELTASONE) 50 MG tablet, Take 1 tablet by mouth 13 hours, 7 hours, and 1 hour prior to  study (Patient not taking: Reported on 10/06/2022), Disp: 3 tablet, Rfl: 0 Social History   Socioeconomic History   Marital status: Married    Spouse name: Juel Burrow   Number of children: 2   Years of education: 12   Highest education level: High school graduate  Occupational History   Occupation: Disabilty   Occupation: DISABILITY    Employer: UNEMPLOYED  Tobacco Use   Smoking status: Former    Current packs/day: 0.00    Average packs/day: 0.5 packs/day for 4.0 years (2.0 ttl pk-yrs)    Types: Cigarettes    Start date: 12/07/1968    Quit date: 11/20/1972    Years since quitting: 49.9   Smokeless tobacco: Never   Tobacco comments:    quit 40 years ago  Vaping Use   Vaping status: Never Used  Substance and Sexual Activity   Alcohol use: No    Alcohol/week: 0.0 standard drinks of alcohol   Drug use: No   Sexual activity: Not Currently    Birth control/protection: Surgical  Other Topics Concern   Not on file  Social History Narrative   Disabled, lives with husband and daughter.  Enjoys shopping. Lives in two story home   1 son and 1 daughter.   1 grandson.   Caffeine 2 cans of soda and tea   Right handed   Social Determinants of Health   Financial Resource Strain: Low Risk  (03/22/2022)   Overall Financial Resource Strain (CARDIA)    Difficulty of Paying Living Expenses: Not hard at all  Food Insecurity: No Food Insecurity (03/22/2022)   Hunger Vital Sign    Worried About Running Out of Food in the Last Year: Never true    Ran Out of Food in the Last Year: Never true  Transportation Needs: No Transportation Needs (03/22/2022)   PRAPARE - Administrator, Civil Service (Medical): No    Lack of Transportation (Non-Medical): No  Physical Activity: Inactive (03/22/2022)   Exercise Vital Sign    Days of Exercise per Week: 0 days    Minutes of Exercise per Session: 0 min  Stress: No Stress Concern Present (03/22/2022)   [employer] of Occupational Health -  Occupational Stress Questionnaire    Feeling of Stress : Not at all  Social Connections: Moderately Integrated (03/22/2022)   Social Connection and Isolation Panel [NHANES]    Frequency of Communication with Friends and Family: More than three times a week    Frequency of Social Gatherings with Friends and Family: More than three times a week    Attends Religious Services: More than 4 times per year    Active Member of Clubs or Organizations: No    Attends  Club or Organization Meetings: Never    Marital Status: Married  Catering manager Violence: Not At Risk (03/22/2022)   Humiliation, Afraid, Rape, and Kick questionnaire    Fear of Current or Ex-Partner: No    Emotionally Abused: No    Physically Abused: No    Sexually Abused: No   Family History  Problem Relation Age of Onset   Bladder Cancer Mother    Heart disease Mother    Hypertension Mother    Heart disease Father    Stroke Father    Hyperlipidemia Sister    Depression Sister    Diabetes Brother    Seizures Brother    Hypertension Brother    Diabetes Other        Multiple family members on both sides    Coronary artery disease Other        Multiple family members on both sides    Colon cancer Neg Hx    Esophageal cancer Neg Hx    Pancreatic cancer Neg Hx    Rectal cancer Neg Hx    Stomach cancer Neg Hx    Breast cancer Neg Hx     Objective: Office vital signs reviewed. BP 105/65   Pulse 69   Temp (!) 97.2 F (36.2 C) (Temporal)   Resp 20   Ht 5\' 3"  (1.6 m)   Wt 150 lb (68 kg)   SpO2 98%   BMI 26.57 kg/m   Physical Examination:  General: Awake, alert, well nourished, No acute distress MSK: Antalgic gait.  Right foot with no appreciable spasticity on exam  Assessment/ Plan: 82 y.o. female   Muscle spasticity - Plan: baclofen (LIORESAL) 10 MG tablet  Stop Flexeril.  Trial of baclofen.  Encouraged Amy.o. hydration, caution with medication as it can cause sedation.  Follow-up with PCP in the next 3 to 4  weeks for recheck.      Raliegh Ip, DO [facility] [phone]

## 2022-10-06 NOTE — Patient Instructions (Signed)
Trial of baclofen.  To REPLACE the flexeril (cyclobenzaprine) Careful, as it can cause sleepiness and dryness. Drink enough water with it and do NOT drive on med.

## 2022-10-19 ENCOUNTER — Encounter: Payer: Self-pay | Admitting: Family Medicine

## 2022-10-30 ENCOUNTER — Other Ambulatory Visit: Payer: Self-pay | Admitting: Family Medicine

## 2022-10-30 ENCOUNTER — Telehealth: Payer: Self-pay | Admitting: Family Medicine

## 2022-10-30 DIAGNOSIS — N644 Mastodynia: Secondary | ICD-10-CM

## 2022-10-30 DIAGNOSIS — Z1231 Encounter for screening mammogram for malignant neoplasm of breast: Secondary | ICD-10-CM

## 2022-10-30 NOTE — Telephone Encounter (Signed)
Called patient to obtain more information to determine if diagnostic or screening needed to be ordered. Patient states that she is having breast soreness in both breast and has needed additional imaging in the past following screening mammogram.  Orders placed for bilat diagnostic and ultrasounds if needed.

## 2022-10-30 NOTE — Telephone Encounter (Signed)
Pt needs PCP to send order to [facility] to have diag mammogram done. She is due for her yearly.

## 2022-11-01 DIAGNOSIS — M7989 Other specified soft tissue disorders: Secondary | ICD-10-CM | POA: Diagnosis not present

## 2022-11-02 ENCOUNTER — Encounter: Payer: Self-pay | Admitting: Family Medicine

## 2022-11-02 ENCOUNTER — Ambulatory Visit: Payer: Medicare Other | Admitting: Family Medicine

## 2022-11-02 VITALS — BP 131/73 | HR 71 | Temp 97.6°F | Ht 63.0 in | Wt 151.4 lb

## 2022-11-02 DIAGNOSIS — R300 Dysuria: Secondary | ICD-10-CM | POA: Diagnosis not present

## 2022-11-02 DIAGNOSIS — I201 Angina pectoris with documented spasm: Secondary | ICD-10-CM | POA: Diagnosis not present

## 2022-11-02 LAB — URINALYSIS, ROUTINE W REFLEX MICROSCOPIC
Bilirubin, UA: NEGATIVE
Glucose, UA: NEGATIVE
Ketones, UA: NEGATIVE
Leukocytes,UA: NEGATIVE
Nitrite, UA: NEGATIVE
Protein,UA: NEGATIVE
RBC, UA: NEGATIVE
Specific Gravity, UA: 1.015 (ref 1.005–1.030)
Urobilinogen, Ur: 0.2 mg/dL (ref 0.2–1.0)
pH, UA: 7 (ref 5.0–7.5)

## 2022-11-02 MED ORDER — NITROGLYCERIN 0.4 MG SL SUBL: SUBLINGUAL_TABLET | SUBLINGUAL | 11 refills | Status: AC

## 2022-11-02 MED ORDER — CEPHALEXIN 500 MG PO CAPS: 500.0000 mg | ORAL_CAPSULE | Freq: Two times a day (BID) | ORAL | 0 refills | Status: DC

## 2022-11-02 NOTE — Progress Notes (Signed)
Acute Office Visit  Subjective:     Patient ID: Amy Black, female    DOB: December 18, 1940, 82 y.o.   MRN: [id number]  Chief Complaint  Patient presents with   Dysuria    Dysuria  This is a new problem. The current episode started in the past 7 days. The patient is experiencing no pain. There has been no fever. Associated symptoms include frequency, nausea and urgency. Pertinent negatives include no chills, discharge, flank pain, hematuria, hesitancy, possible pregnancy, sweats or vomiting. She has tried increased fluids for the symptoms. The treatment provided no relief. Her past medical history is significant for recurrent UTIs.   She would also like a refill of nitroglycerin to have one hand. She is out of refills and hers has expired. She denies chest pain and has not had to use it.   Review of Systems  Constitutional:  Negative for chills.  Gastrointestinal:  Positive for nausea. Negative for vomiting.  Genitourinary:  Positive for dysuria, frequency and urgency. Negative for flank pain, hematuria and hesitancy.        Objective:    BP 131/73   Pulse 71   Temp 97.6 F (36.4 C) (Temporal)   Ht 5\' 3"  (1.6 m)   Wt 151 lb 6 oz (68.7 kg)   SpO2 98%   BMI 26.81 kg/m    Physical Exam Vitals and nursing note reviewed.  Constitutional:      General: She is not in acute distress.    Appearance: She is not ill-appearing, toxic-appearing or diaphoretic.  Cardiovascular:     Rate and Rhythm: Normal rate and regular rhythm.     Heart sounds: Normal heart sounds. No murmur heard. Pulmonary:     Effort: Pulmonary effort is normal. No respiratory distress.     Breath sounds: Normal breath sounds. No wheezing.  Abdominal:     General: Bowel sounds are normal. There is no distension.     Tenderness: There is no abdominal tenderness. There is no right CVA tenderness, left CVA tenderness, guarding or rebound.  Musculoskeletal:     Right lower leg: No edema.     Left lower leg:  No edema.  Skin:    General: Skin is warm and dry.  Neurological:     Mental Status: She is alert and oriented to person, place, and time. Mental status is at baseline.     Gait: Gait abnormal (using cane).  Psychiatric:        Mood and Affect: Mood normal.        Behavior: Behavior normal.     No results found for any visits on 11/02/22.      Assessment & Plan:   Amy Black was seen today for dysuria.  Diagnoses and all orders for this visit:  Dysuria UA negative today, however last UA on file was also negative with a positive culture. Will treat empirically pending urine culture today.  -     Urinalysis, Routine w reflex microscopic -     Urine Culture -     cephALEXin (KEFLEX) 500 MG capsule; Take 1 capsule (500 mg total) by mouth 2 (two) times daily.  Coronary artery spasm (HCC) Refill provided today. Denies symptoms.  -     nitroGLYCERIN (NITROSTAT) 0.4 MG SL tablet; DISSOLVE ONE TABLET UNDER TONGUE EVERY 5 MINUTES UP TO 3 DOSES AS NEEDED FOR CHEST PAIN  Return if symptoms worsen or fail to improve.  The patient indicates understanding of these issues and agrees with  the plan.  Gabriel Earing, FNP

## 2022-11-04 LAB — URINE CULTURE

## 2022-11-06 NOTE — Telephone Encounter (Signed)
Is Dois Davenport ok with the switch?

## 2022-11-08 ENCOUNTER — Telehealth: Payer: Self-pay | Admitting: Family Medicine

## 2022-11-08 NOTE — Telephone Encounter (Signed)
  Incoming Patient Call  11/08/2022  What symptoms do you have? Urinating more than usual and when she wakes up in the morning on her right side it feels like she is sitting on something  How long have you been sick? About a month or more  Have you been seen for this problem? Yes 11/02/22 by TM, was told she did not have an infection.Pt states there is something wrong with her bladder what is the next step?  If your provider decides to give you a prescription, which pharmacy would you like for it to be sent to? [facility]   Patient informed that this information will be sent to the clinical staff for review and that they should receive a follow up call.

## 2022-11-08 NOTE — Telephone Encounter (Signed)
Pt. Needs to be seen for this. Thanks, WS 

## 2022-11-09 ENCOUNTER — Ambulatory Visit: Payer: Medicare Other | Admitting: Family

## 2022-11-09 ENCOUNTER — Encounter: Payer: Self-pay | Admitting: Family

## 2022-11-09 VITALS — BP 122/69 | HR 84 | Temp 98.0°F | Ht 63.0 in | Wt 151.0 lb

## 2022-11-09 DIAGNOSIS — F411 Generalized anxiety disorder: Secondary | ICD-10-CM

## 2022-11-09 DIAGNOSIS — R350 Frequency of micturition: Secondary | ICD-10-CM

## 2022-11-09 LAB — MICROSCOPIC EXAMINATION
RBC, Urine: NONE SEEN /[HPF] (ref 0–2)
Renal Epithel, UA: NONE SEEN /[HPF]
Yeast, UA: NONE SEEN

## 2022-11-09 LAB — URINALYSIS, COMPLETE
Bilirubin, UA: NEGATIVE
Glucose, UA: NEGATIVE
Ketones, UA: NEGATIVE
Nitrite, UA: NEGATIVE
Protein,UA: NEGATIVE
RBC, UA: NEGATIVE
Specific Gravity, UA: 1.02 (ref 1.005–1.030)
Urobilinogen, Ur: 0.2 mg/dL (ref 0.2–1.0)
pH, UA: 6 (ref 5.0–7.5)

## 2022-11-09 MED ORDER — MIRTAZAPINE 15 MG PO TBDP
15.0000 mg | ORAL_TABLET | Freq: Every day | ORAL | 1 refills | Status: DC
Start: 2022-11-09 — End: 2022-11-09

## 2022-11-09 MED ORDER — MIRTAZAPINE 7.5 MG PO TABS
7.5000 mg | ORAL_TABLET | Freq: Every day | ORAL | 1 refills | Status: DC
Start: 2022-11-09 — End: 2023-02-13

## 2022-11-09 MED ORDER — ALPRAZOLAM 0.25 MG PO TABS
0.2500 mg | ORAL_TABLET | Freq: Three times a day (TID) | ORAL | 0 refills | Status: DC | PRN
Start: 2022-11-09 — End: 2022-12-15

## 2022-11-09 NOTE — Telephone Encounter (Signed)
Is any other female providers willing to have Ms Brielle as their patient?

## 2022-11-09 NOTE — Telephone Encounter (Signed)
Decline due to office policy

## 2022-11-09 NOTE — Telephone Encounter (Signed)
Not at this time.

## 2022-11-09 NOTE — Progress Notes (Signed)
Subjective:    Patient ID: Amy Black, female    DOB: 1940-09-05, 82 y.o.   MRN: [id number]  Chief Complaint  Patient presents with   Leg Pain    Right side when she wakes up in the morning feels like something is in it.   Urinary Urgency   Pt presents to the office today with urinary frequency.   She is also complaining of anxiety. She has been on xanax since 1982 and wants to stop this medication.  Leg Pain   Urinary Frequency  This is a new problem. The current episode started 1 to 4 weeks ago. The problem occurs intermittently. The problem has been gradually improving. The pain is at a severity of 0/10. The patient is experiencing no pain. Associated symptoms include frequency, hesitancy and urgency. Pertinent negatives include no hematuria, nausea or vomiting. She has tried increased fluids for the symptoms. The treatment provided mild relief.      Review of Systems  Gastrointestinal:  Negative for nausea and vomiting.  Genitourinary:  Positive for frequency, hesitancy and urgency. Negative for hematuria.  All other systems reviewed and are negative.      Objective:   Physical Exam Vitals reviewed.  Constitutional:      General: She is not in acute distress.    Appearance: She is well-developed.  HENT:     Head: Normocephalic and atraumatic.     Right Ear: Tympanic membrane normal.     Left Ear: Tympanic membrane normal.  Eyes:     Pupils: Pupils are equal, round, and reactive to light.  Neck:     Thyroid: No thyromegaly.  Cardiovascular:     Rate and Rhythm: Normal rate and regular rhythm.     Heart sounds: Normal heart sounds. No murmur heard. Pulmonary:     Effort: Pulmonary effort is normal. No respiratory distress.     Breath sounds: Normal breath sounds. No wheezing.  Abdominal:     General: Bowel sounds are normal. There is no distension.     Palpations: Abdomen is soft.     Tenderness: There is no abdominal tenderness.  Musculoskeletal:         General: No tenderness. Normal range of motion.     Cervical back: Normal range of motion and neck supple.  Skin:    General: Skin is warm and dry.  Neurological:     Mental Status: She is alert and oriented to person, place, and time.     Cranial Nerves: No cranial nerve deficit.     Motor: Weakness present.     Gait: Gait abnormal.     Deep Tendon Reflexes: Reflexes are normal and symmetric.  Psychiatric:        Behavior: Behavior normal.        Thought Content: Thought content normal.        Judgment: Judgment normal.      BP 122/69   Pulse 84   Temp 98 F (36.7 C) (Temporal)   Ht 5\' 3"  (1.6 m)   Wt 151 lb (68.5 kg)   SpO2 97%   BMI 26.75 kg/m       Assessment & Plan:  Nelie Drumgoole Amenta comes in today with chief complaint of Leg Pain (Right side when she wakes up in the morning feels like something is in it.) and Urinary Urgency   Diagnosis and orders addressed:  1. Urinary frequency - Urine Culture - Urinalysis, Complete   2. GAD (generalized anxiety disorder) Will  decrease xanax to 0.25 mg TID Will add Remeron 7.5 mg  Stress management  RTO in 2-4 weeks  Will will slow tamper off xanax  - ALPRAZolam (XANAX) 0.25 MG tablet; Take 1 tablet (0.25 mg total) by mouth 3 (three) times daily as needed for anxiety.  Dispense: 90 tablet; Refill: 0 - mirtazapine (REMERON SOL-TAB) 7.5 MG disintegrating tablet; Take 1 tablet (7.5 mg total) by mouth at bedtime.  Dispense: 90 tablet; Refill: 1    Follow up plan: 2-4 weeks to recheck GAD   Jannifer Rodney, FNP

## 2022-11-09 NOTE — Patient Instructions (Signed)

## 2022-11-10 ENCOUNTER — Telehealth: Payer: Self-pay | Admitting: Cardiology

## 2022-11-10 DIAGNOSIS — R031 Nonspecific low blood-pressure reading: Secondary | ICD-10-CM

## 2022-11-10 MED ORDER — ISOSORBIDE MONONITRATE ER 120 MG PO TB24: 120.0000 mg | ORAL_TABLET | Freq: Every day | ORAL | 3 refills | Status: DC

## 2022-11-10 NOTE — Telephone Encounter (Signed)
Spoke with patient and she called to verify her dose of IMDUrR. She is aware the correct dose is 120 mg

## 2022-11-10 NOTE — Telephone Encounter (Signed)
Pt's medication was sent to pt's pharmacy as requested. Confirmation received.  °

## 2022-11-10 NOTE — Telephone Encounter (Signed)
Made pt aware that because of the situation, Amy Black has agreed to make an exception and see patient as her patient.

## 2022-11-10 NOTE — Telephone Encounter (Signed)
*  STAT* If patient is at the pharmacy, call can be transferred to refill team.   1. Which medications need to be refilled? (please list name of each medication and dose if known) new prescription for Imdur   2. Would you like to learn more about the convenience, safety, & potential cost savings by using the [facility]?    3. Are you open to using the [facility] (Type [facility].    4. Which pharmacy/location (including street and city if local pharmacy) is medication to be sent to? [facility] phone # [phone]  5. Do they need a 30 day or 90 day supply? 90 days and refills

## 2022-11-10 NOTE — Telephone Encounter (Signed)
Patient is calling to speak to Dr. Antoine Poche or nurse

## 2022-11-11 LAB — URINE CULTURE

## 2022-11-14 ENCOUNTER — Other Ambulatory Visit: Payer: Medicare Other

## 2022-11-14 ENCOUNTER — Ambulatory Visit: Payer: Medicare Other | Admitting: Family Medicine

## 2022-11-17 ENCOUNTER — Other Ambulatory Visit: Payer: Self-pay | Admitting: Family Medicine

## 2022-11-17 DIAGNOSIS — G373 Acute transverse myelitis in demyelinating disease of central nervous system: Secondary | ICD-10-CM

## 2022-11-22 ENCOUNTER — Encounter: Payer: Self-pay | Admitting: Family

## 2022-11-28 ENCOUNTER — Other Ambulatory Visit: Payer: Self-pay

## 2022-11-28 DIAGNOSIS — C801 Malignant (primary) neoplasm, unspecified: Secondary | ICD-10-CM

## 2022-11-29 ENCOUNTER — Inpatient Hospital Stay: Payer: Medicare Other | Admitting: Medical Oncology

## 2022-11-29 ENCOUNTER — Inpatient Hospital Stay: Payer: Medicare Other

## 2022-12-04 ENCOUNTER — Inpatient Hospital Stay (HOSPITAL_BASED_OUTPATIENT_CLINIC_OR_DEPARTMENT_OTHER): Payer: Medicare Other | Admitting: Medical Oncology

## 2022-12-04 ENCOUNTER — Encounter: Payer: Self-pay | Admitting: Medical Oncology

## 2022-12-04 ENCOUNTER — Inpatient Hospital Stay: Payer: Medicare Other | Attending: Hematology & Oncology

## 2022-12-04 VITALS — BP 139/75 | HR 62 | Temp 97.6°F | Resp 17 | Wt 151.1 lb

## 2022-12-04 DIAGNOSIS — C801 Malignant (primary) neoplasm, unspecified: Secondary | ICD-10-CM

## 2022-12-04 DIAGNOSIS — D649 Anemia, unspecified: Secondary | ICD-10-CM

## 2022-12-04 DIAGNOSIS — Z8589 Personal history of malignant neoplasm of other organs and systems: Secondary | ICD-10-CM | POA: Diagnosis not present

## 2022-12-04 DIAGNOSIS — D75839 Thrombocytosis, unspecified: Secondary | ICD-10-CM | POA: Insufficient documentation

## 2022-12-04 DIAGNOSIS — R634 Abnormal weight loss: Secondary | ICD-10-CM | POA: Diagnosis not present

## 2022-12-04 DIAGNOSIS — D509 Iron deficiency anemia, unspecified: Secondary | ICD-10-CM | POA: Insufficient documentation

## 2022-12-04 DIAGNOSIS — R61 Generalized hyperhidrosis: Secondary | ICD-10-CM | POA: Diagnosis not present

## 2022-12-04 DIAGNOSIS — M549 Dorsalgia, unspecified: Secondary | ICD-10-CM | POA: Diagnosis not present

## 2022-12-04 DIAGNOSIS — R5383 Other fatigue: Secondary | ICD-10-CM

## 2022-12-04 LAB — CBC WITH DIFFERENTIAL (CANCER CENTER ONLY)
Abs Immature Granulocytes: 0.08 10*3/uL — ABNORMAL HIGH (ref 0.00–0.07)
Basophils Absolute: 0.1 10*3/uL (ref 0.0–0.1)
Basophils Relative: 1 %
Eosinophils Absolute: 0.4 10*3/uL (ref 0.0–0.5)
Eosinophils Relative: 6 %
HCT: 34.1 % — ABNORMAL LOW (ref 36.0–46.0)
Hemoglobin: 11.2 g/dL — ABNORMAL LOW (ref 12.0–15.0)
Immature Granulocytes: 1 %
Lymphocytes Relative: 31 %
Lymphs Abs: 1.8 10*3/uL (ref 0.7–4.0)
MCH: 30.6 pg (ref 26.0–34.0)
MCHC: 32.8 g/dL (ref 30.0–36.0)
MCV: 93.2 fL (ref 80.0–100.0)
Monocytes Absolute: 0.5 10*3/uL (ref 0.1–1.0)
Monocytes Relative: 8 %
Neutro Abs: 3.1 10*3/uL (ref 1.7–7.7)
Neutrophils Relative %: 53 %
Platelet Count: 418 10*3/uL — ABNORMAL HIGH (ref 150–400)
RBC: 3.66 MIL/uL — ABNORMAL LOW (ref 3.87–5.11)
RDW: 12.9 % (ref 11.5–15.5)
WBC Count: 5.9 10*3/uL (ref 4.0–10.5)
nRBC: 0 % (ref 0.0–0.2)

## 2022-12-04 LAB — CMP (CANCER CENTER ONLY)
ALT: 12 U/L (ref 0–44)
AST: 16 U/L (ref 15–41)
Albumin: 4.5 g/dL (ref 3.5–5.0)
Alkaline Phosphatase: 85 U/L (ref 38–126)
Anion gap: 7 (ref 5–15)
BUN: 15 mg/dL (ref 8–23)
CO2: 28 mmol/L (ref 22–32)
Calcium: 9.7 mg/dL (ref 8.9–10.3)
Chloride: 102 mmol/L (ref 98–111)
Creatinine: 0.89 mg/dL (ref 0.44–1.00)
GFR, Estimated: >60 mL/min (ref >60)
Glucose, Bld: 87 mg/dL (ref 70–99)
Potassium: 4.4 mmol/L (ref 3.5–5.1)
Sodium: 137 mmol/L (ref 135–145)
Total Bilirubin: 0.3 mg/dL (ref 0.3–1.2)
Total Protein: 7 g/dL (ref 6.5–8.1)

## 2022-12-04 LAB — RETICULOCYTES
Immature Retic Fract: 10 % (ref 2.3–15.9)
RBC.: 3.67 MIL/uL — ABNORMAL LOW (ref 3.87–5.11)
Retic Count, Absolute: 65.3 10*3/uL (ref 19.0–186.0)
Retic Ct Pct: 1.8 % (ref 0.4–3.1)

## 2022-12-04 LAB — FERRITIN: Ferritin: 167 ng/mL (ref 11–307)

## 2022-12-04 MED ORDER — PREDNISONE 50 MG PO TABS: ORAL_TABLET | ORAL | 0 refills | Status: DC

## 2022-12-04 MED ORDER — DIPHENHYDRAMINE HCL 50 MG PO TABS: ORAL_TABLET | ORAL | 0 refills | Status: DC

## 2022-12-04 NOTE — Progress Notes (Signed)
Hematology and Oncology Follow Up Visit  Amy Black [id number] [birth year] 82 y.o. 12/04/2022   Principle Diagnosis:  Transient thrombocytosis History transverse myelitis History of adenosquamous carcinoma of unknown primary Iron def anemia   Current Therapy:        Fusion Plus -- daily   Interim History:  Amy Black is here today with her son for follow-up  At our last visit on 08/17/2022 we discussed CT imaging given her symptoms of abdominal pain, unintentional weight loss, etc. This was ordered but has not been performed. She also was going to discuss her fatigue and dizziness with her cardiologist which she has done.  She reports that she took her premeds I gave her at her last visit for a cardiac scan she had done with contrast. She tolerated this well and didn't have any trouble with the IV contrast or premeds.   Today she states that she has been fatigued with occasional dizziness and has had nausea for the past month. Overall feeling worse than at her last visit.   No falls or syncope reported.  No swelling, tenderness, numbness or tingling in her extremities.  No fever, chills, n/v, cough, rash, dizziness, SOB, chest pain, palpitations, abdominal pain or changes in bowel or bladder habits.  She does have night sweats  Appetite and hydration are good.   Wt Readings from Last 3 Encounters:  12/04/22 151 lb 1.9 oz (68.5 kg)  11/09/22 151 lb (68.5 kg)  11/02/22 151 lb 6 oz (68.7 kg)   ECOG Performance Status: 1 - Symptomatic but completely ambulatory  Medications:  Allergies as of 12/04/2022       Reactions   Iodine Itching   Ivp Dye [iodinated Contrast Media] Itching   Amlodipine Other (See Comments)   headaches   Lexapro [escitalopram Oxalate] Other (See Comments)   Drunk, High feeling   Cymbalta [duloxetine Hcl] Other (See Comments)   sleepiness   Gabapentin Other (See Comments)   sleepiness   Zanaflex [tizanidine Hcl] Other (See Comments)   Very drunk  feeling next day        Medication List        Accurate as of December 04, 2022  3:27 PM. If you have any questions, ask your nurse or doctor.          ALPRAZolam 0.25 MG tablet Commonly known as: XANAX Take 1 tablet (0.25 mg total) by mouth 3 (three) times daily as needed for anxiety.   baclofen 10 MG tablet Commonly known as: LIORESAL Take 0.5-1 tablets (5-10 mg total) by mouth 2 (two) times daily as needed for muscle spasms (to REPLACE the flexeril).   cetirizine 10 MG tablet Commonly known as: ZYRTEC Take 1 tablet (10 mg total) by mouth daily.   diphenhydrAMINE 50 MG tablet Commonly known as: BENADRYL Take 1 tablet by mouth one hour prior to IV contrast Started by: Brand Males Daymion Nazaire   fluticasone 50 MCG/ACT nasal spray Commonly known as: FLONASE Place 2 sprays into both nostrils daily.   hydrOXYzine 10 MG tablet Commonly known as: ATARAX TAKE 1 TABLET BY MOUTH TWICE DAILY.   Hyoscyamine Sulfate SL 0.125 MG Subl Commonly known as: Levsin/SL Place 0.125 mg under the tongue every 4 (four) hours as needed (abd cramping and diarrhea).   isosorbide mononitrate 120 MG 24 hr tablet Commonly known as: IMDUR Take 1 tablet (120 mg total) by mouth daily.   mirtazapine 7.5 MG tablet Commonly known as: REMERON Take 1 tablet (7.5 mg total) by  mouth at bedtime.   nitroGLYCERIN 0.4 MG SL tablet Commonly known as: NITROSTAT DISSOLVE ONE TABLET UNDER TONGUE EVERY 5 MINUTES UP TO 3 DOSES AS NEEDED FOR CHEST PAIN   predniSONE 50 MG tablet Commonly known as: DELTASONE Take 1 tablet by mouth 13 hours, 7 hours, and 1 hour prior to study Started by: Rushie Chestnut   traMADol 50 MG tablet Commonly known as: ULTRAM TAKE 1 TABLET EVERY 12 HOURS AS NEEDED   triamterene-hydrochlorothiazide 37.5-25 MG capsule Commonly known as: DYAZIDE TAKE ONE CAPSULE BY MOUTH 3 TIMES PER WEEK   verapamil 180 MG CR tablet Commonly known as: CALAN-SR Take 1 tablet (180 mg total) by mouth  at bedtime.        Allergies:  Allergies  Allergen Reactions   Iodine Itching   Ivp Dye [Iodinated Contrast Media] Itching   Amlodipine Other (See Comments)    headaches   Lexapro [Escitalopram Oxalate] Other (See Comments)    Drunk, High feeling   Cymbalta [Duloxetine Hcl] Other (See Comments)    sleepiness   Gabapentin Other (See Comments)    sleepiness   Zanaflex [Tizanidine Hcl] Other (See Comments)    Very drunk feeling next day    Past Medical History, Surgical history, Social history, and Family History were reviewed and updated.  Review of Systems: All other 10 point review of systems is negative.   Physical Exam:  weight is 151 lb 1.9 oz (68.5 kg). Her oral temperature is 97.6 F (36.4 C). Her blood pressure is 139/75 and her pulse is 62. Her respiration is 17 and oxygen saturation is 100%.   Wt Readings from Last 3 Encounters:  12/04/22 151 lb 1.9 oz (68.5 kg)  11/09/22 151 lb (68.5 kg)  11/02/22 151 lb 6 oz (68.7 kg)   Constitutional: AA X 3 Ocular: Sclerae unicteric, pupils equal, round and reactive to light Ear-nose-throat: Oropharynx clear, dentition fair Lymphatic: No cervical or supraclavicular adenopathy Lungs no rales or rhonchi, good excursion bilaterally Heart regular rate and rhythm, no murmur appreciated Abd soft MSK no focal spinal tenderness, no joint edema Neuro: non-focal, well-oriented, appropriate affect Skin: No rash  Lab Results  Component Value Date   WBC 5.9 12/04/2022   HGB 11.2 (L) 12/04/2022   HCT 34.1 (L) 12/04/2022   MCV 93.2 12/04/2022   PLT 418 (H) 12/04/2022   Lab Results  Component Value Date   FERRITIN 206 08/17/2022   IRON 82 08/17/2022   TIBC 246 (L) 08/17/2022   UIBC 164 08/17/2022   IRONPCTSAT 33 (H) 08/17/2022   Lab Results  Component Value Date   RETICCTPCT 1.8 12/04/2022   RBC 3.67 (L) 12/04/2022   No results found for: "KPAFRELGTCHN", "LAMBDASER", "KAPLAMBRATIO" No results found for: "IGGSERUM",  "IGA", "IGMSERUM" No results found for: "TOTALPROTELP", "ALBUMINELP", "A1GS", "A2GS", "BETS", "BETA2SER", "GAMS", "MSPIKE", "SPEI"   Chemistry      Component Value Date/Time   NA 135 05/30/2022 1353   NA 140 11/11/2021 1427   NA 138 01/28/2016 1303   K 4.1 05/30/2022 1353   K 4.2 07/28/2016 1243   K 4.2 01/28/2016 1303   CL 101 05/30/2022 1353   CL 98 07/28/2016 1243   CL 101 07/20/2014 1131   CO2 27 05/30/2022 1353   CO2 29 07/28/2016 1243   CO2 30 (H) 01/28/2016 1303   BUN 11 05/30/2022 1353   BUN 9 11/11/2021 1427   BUN 10.0 01/28/2016 1303   CREATININE 0.84 05/30/2022 1353   CREATININE 0.94 07/28/2016  1243   CREATININE 0.8 01/28/2016 1303      Component Value Date/Time   CALCIUM 9.3 05/30/2022 1353   CALCIUM 9.9 07/28/2016 1243   CALCIUM 10.0 01/28/2016 1303   ALKPHOS 83 05/30/2022 1353   ALKPHOS 105 07/28/2016 1243   ALKPHOS 123 01/28/2016 1303   AST 18 05/30/2022 1353   AST 13 01/28/2016 1303   ALT 13 05/30/2022 1353   ALT 12 01/28/2016 1303   BILITOT 0.2 (L) 05/30/2022 1353   BILITOT 0.51 01/28/2016 1303     Encounter Diagnoses  Name Primary?   Adenocarcinoma of unknown primary (HCC) Yes   Night sweats    Unintentional weight loss    Mid back pain      Impression and Plan: Amy Black is a very pleasant 82yo African American female with history of transient thrombocytosis and also history of adenosquamous carcinoma with unknown primary.   We discussed obtained the CT scan previously ordered. She would like to proceed forward with this. I have resent her premeds and will have scheduling schedule her imaging. Initial review of her CBC is stable- may need some iron. Iron studies pending.   Please schedule CT scan that was placed in July.  RTC 3 months APP, labs (CBC w/, CMP, iron, ferritin, B12, folate, LDH)-[place]   Rushie Chestnut, PA-C 10/28/20243:27 PM

## 2022-12-05 LAB — IRON AND IRON BINDING CAPACITY (CC-WL,HP ONLY)
Iron: 70 ug/dL (ref 28–170)
Saturation Ratios: 27 % (ref 10.4–31.8)
TIBC: 260 ug/dL (ref 250–450)
UIBC: 190 ug/dL (ref 148–442)

## 2022-12-06 ENCOUNTER — Telehealth: Payer: Self-pay

## 2022-12-06 NOTE — Telephone Encounter (Signed)
-----   Message from Rushie Chestnut sent at 12/05/2022  4:26 PM EDT ----- Her iron studies look good.

## 2022-12-06 NOTE — Telephone Encounter (Signed)
Called and informed patient of lab results, patient verbalized understanding and denies any questions or concerns at this time.   

## 2022-12-07 ENCOUNTER — Ambulatory Visit: Payer: Medicare Other | Admitting: Family

## 2022-12-07 DIAGNOSIS — Y9389 Activity, other specified: Secondary | ICD-10-CM | POA: Diagnosis not present

## 2022-12-07 DIAGNOSIS — M25511 Pain in right shoulder: Secondary | ICD-10-CM | POA: Diagnosis not present

## 2022-12-07 DIAGNOSIS — M79604 Pain in right leg: Secondary | ICD-10-CM | POA: Diagnosis not present

## 2022-12-07 DIAGNOSIS — M25561 Pain in right knee: Secondary | ICD-10-CM | POA: Diagnosis not present

## 2022-12-07 DIAGNOSIS — W1812XA Fall from or off toilet with subsequent striking against object, initial encounter: Secondary | ICD-10-CM | POA: Diagnosis not present

## 2022-12-07 DIAGNOSIS — M1711 Unilateral primary osteoarthritis, right knee: Secondary | ICD-10-CM | POA: Diagnosis not present

## 2022-12-07 DIAGNOSIS — M79671 Pain in right foot: Secondary | ICD-10-CM | POA: Diagnosis not present

## 2022-12-13 ENCOUNTER — Ambulatory Visit: Payer: Medicare Other

## 2022-12-13 ENCOUNTER — Ambulatory Visit
Admission: RE | Admit: 2022-12-13 | Discharge: 2022-12-13 | Disposition: A | Discharge status: Home / Self Care | Payer: Medicare Other | Source: Ambulatory Visit | Attending: Family Medicine | Admitting: Family Medicine

## 2022-12-13 DIAGNOSIS — N644 Mastodynia: Secondary | ICD-10-CM | POA: Diagnosis not present

## 2022-12-15 ENCOUNTER — Ambulatory Visit (INDEPENDENT_AMBULATORY_CARE_PROVIDER_SITE_OTHER): Payer: Medicare Other | Admitting: Family

## 2022-12-15 ENCOUNTER — Encounter: Payer: Self-pay | Admitting: Family

## 2022-12-15 ENCOUNTER — Telehealth: Payer: Self-pay | Admitting: Family

## 2022-12-15 VITALS — BP 125/80 | HR 74 | Temp 97.0°F | Ht 63.0 in | Wt 152.0 lb

## 2022-12-15 DIAGNOSIS — K219 Gastro-esophageal reflux disease without esophagitis: Secondary | ICD-10-CM

## 2022-12-15 DIAGNOSIS — M25511 Pain in right shoulder: Secondary | ICD-10-CM

## 2022-12-15 DIAGNOSIS — I10 Essential (primary) hypertension: Secondary | ICD-10-CM

## 2022-12-15 DIAGNOSIS — W19XXXA Unspecified fall, initial encounter: Secondary | ICD-10-CM

## 2022-12-15 DIAGNOSIS — F411 Generalized anxiety disorder: Secondary | ICD-10-CM

## 2022-12-15 DIAGNOSIS — E559 Vitamin D deficiency, unspecified: Secondary | ICD-10-CM | POA: Diagnosis not present

## 2022-12-15 DIAGNOSIS — D508 Other iron deficiency anemias: Secondary | ICD-10-CM | POA: Diagnosis not present

## 2022-12-15 DIAGNOSIS — G4700 Insomnia, unspecified: Secondary | ICD-10-CM | POA: Diagnosis not present

## 2022-12-15 DIAGNOSIS — Y92009 Unspecified place in unspecified non-institutional (private) residence as the place of occurrence of the external cause: Secondary | ICD-10-CM | POA: Diagnosis not present

## 2022-12-15 DIAGNOSIS — F419 Anxiety disorder, unspecified: Secondary | ICD-10-CM

## 2022-12-15 MED ORDER — ALPRAZOLAM 0.25 MG PO TABS: 0.2500 mg | ORAL_TABLET | Freq: Two times a day (BID) | ORAL | 0 refills | Status: DC | PRN

## 2022-12-15 MED ORDER — OMEPRAZOLE 20 MG PO CPDR: 20.0000 mg | DELAYED_RELEASE_CAPSULE | Freq: Every day | ORAL | 1 refills | Status: DC

## 2022-12-15 NOTE — Telephone Encounter (Signed)
We are decreasing to BID and will eventually be trying to tamper off.   Jannifer Rodney, FNP

## 2022-12-15 NOTE — Patient Instructions (Signed)
Rotator Cuff Tendinitis  Rotator cuff tendinitis is inflammation of the tendons in the rotator cuff. Tendons are tough, cord-like bands that connect muscle to bone. The rotator cuff includes all of the muscles and tendons that connect the arm to the shoulder. The rotator cuff holds the head of the humerus, or the upper arm bone, in the cup of the shoulder blade (scapula). This condition can lead to a long-term (chronic) tear. The tear may be partial or complete. What are the causes? This condition is usually caused by overusing the rotator cuff. What increases the risk? This condition is more likely to develop in athletes and workers who frequently use their shoulder or reach over their heads. This can include activities such as: Tennis. Baseball or softball. Swimming. Construction work. Painting. What are the signs or symptoms? Symptoms of this condition include: Pain that spreads (radiates) from the shoulder to the upper arm. Swelling and tenderness in front of the shoulder. Pain when reaching, pulling, or lifting the arm above the head. Pain when lowering the arm from above the head. Minor pain in the shoulder when resting. Increased pain in the shoulder at night. Difficulty placing the arm behind the back. How is this diagnosed? This condition is diagnosed with a physical exam and medical history. Tests may also be done, including: X-rays. CT. MRI. Ultrasound. How is this treated? Treatment depends on the severity of the condition. In less severe cases, treatment may include: Rest. This may be done with a sling that holds the shoulder still (immobilization). Your health care provider may also recommend avoiding activities that involve lifting your arm over your head. Icing the shoulder. Anti-inflammatory medicines, such as aspirin or ibuprofen. In more severe cases, treatment may include: Physical therapy. Steroid injections. Surgery. Follow these instructions at home: If  you have a removable sling: Wear the sling as told by your provider. Remove it only as told by your provider. Check the skin around the sling every day. Tell your provider about any concerns. Loosen the sling if your fingers tingle, become numb, or turn cold or blue. Keep the sling clean and dry. If the sling is not waterproof: Do not let it get wet. Remove it as told by your provider when you take a bath or shower. Managing pain, stiffness, and swelling  If told, put ice on the injured area. If you have a removable sling, remove it as told by your provider. Put ice in a plastic bag. Place a towel between your skin and the bag. Leave the ice on for 20 minutes, 2-3 times a day. If your skin turns bright red, remove the ice right away to prevent skin damage. The risk of damage is higher if you cannot feel pain, heat, or cold. Move your fingers often to reduce stiffness and swelling. Raise (elevate) the injured area above the level of your heart while you are sitting or lying down. Find a comfortable sleeping position, or sleep in a recliner, if available. Activity Rest your shoulder as told by your provider. Ask your provider when it is safe to drive if you have a sling on your arm. Return to your normal activities as told by your provider. Ask your provider what activities are safe for you. Do any exercises or stretches as told by your provider or physical therapist. If you do repetitive overhead tasks, take small breaks in between and include stretching exercises as told by your provider. General instructions Do not use any products that contain nicotine or   tobacco. These products include cigarettes, chewing tobacco, and vaping devices, such as e-cigarettes. These can delay healing. If you need help quitting, ask your provider. Take over-the-counter and prescription medicines only as told by your provider. Contact a health care provider if: Your pain gets worse. You have new pain in  your arm, hands, or fingers. Your pain is not relieved with medicine or does not get better after 6 weeks of treatment. You have crackling sensations when moving your shoulder in certain directions. You hear a snapping sound after using your shoulder, followed by severe pain and weakness. Your arm, hand, or fingers are numb or tingling. Get help right away if: Your arm, hand, or fingers are swollen, painful, or they turn white or blue. This information is not intended to replace advice given to you by your health care provider. Make sure you discuss any questions you have with your health care provider. Document Revised: 09/21/2021 Document Reviewed: 09/07/2021 Elsevier Patient Education  2024 [employer].

## 2022-12-15 NOTE — Progress Notes (Signed)
Subjective:    Patient ID: Amy Black, female    DOB: [birth year], 82 y.o.   MRN: [id number]  Chief Complaint  Patient presents with   Medical Management of Chronic Issues   Fall    On10/31/24 hurt right knee and shoulder   Pt presents to the office today for chronic follow up.   She is followed by Cardiologists every 6 months for CAD and HTN.  She is followed by Oncologists/hematologists for iron anemia and hx adenocarcinoma.   She is followed by GI as needed for IBS.    She is also complaining of anxiety. She has been on xanax since 1982 and wants to stop this medication. We have tampered her down to Xanax 0.25 mg TID and added Remeron 7.5 mg. Reports she is sleeping better.  Fall The accident occurred More than 1 week ago. The point of impact was the right shoulder and right knee. The pain is at a severity of 10/10. The pain is moderate. She has tried rest and NSAID for the symptoms. The treatment provided mild relief.  Hypertension This is a chronic problem. The current episode started more than 1 year ago. The problem has been waxing and waning since onset. The problem is uncontrolled. Associated symptoms include anxiety and malaise/fatigue. Pertinent negatives include no peripheral edema or shortness of breath. Risk factors for coronary artery disease include obesity and sedentary lifestyle. The current treatment provides moderate improvement.  Gastroesophageal Reflux She complains of belching, heartburn and a hoarse voice. This is a chronic problem. The current episode started more than 1 year ago. She has tried an antacid for the symptoms. The treatment provided mild relief.  Anxiety Presents for follow-up visit. Symptoms include excessive worry, nervous/anxious behavior and restlessness. Patient reports no shortness of breath. Symptoms occur occasionally. The severity of symptoms is mild.        Review of Systems  Constitutional:  Positive for malaise/fatigue.  HENT:   Positive for hoarse voice.   Respiratory:  Negative for shortness of breath.   Gastrointestinal:  Positive for heartburn.  Psychiatric/Behavioral:  The patient is nervous/anxious.   All other systems reviewed and are negative.      Objective:   Physical Exam Vitals reviewed.  Constitutional:      General: She is not in acute distress.    Appearance: She is well-developed.  HENT:     Head: Normocephalic and atraumatic.  Eyes:     Pupils: Pupils are equal, round, and reactive to light.  Neck:     Thyroid: No thyromegaly.  Cardiovascular:     Rate and Rhythm: Normal rate and regular rhythm.     Heart sounds: Normal heart sounds. No murmur heard. Pulmonary:     Effort: Pulmonary effort is normal. No respiratory distress.     Breath sounds: Normal breath sounds. No wheezing.  Abdominal:     General: Bowel sounds are normal. There is no distension.     Palpations: Abdomen is soft.     Tenderness: There is no abdominal tenderness.  Musculoskeletal:        General: No tenderness. Normal range of motion.     Cervical back: Normal range of motion and neck supple.     Comments: Pain in right shoulder with abduction, unable to fully abduct   Skin:    General: Skin is warm and dry.  Neurological:     Mental Status: She is alert and oriented to person, place, and time.  Cranial Nerves: No cranial nerve deficit.     Deep Tendon Reflexes: Reflexes are normal and symmetric.  Psychiatric:        Behavior: Behavior normal.        Thought Content: Thought content normal.        Judgment: Judgment normal.       BP 125/80   Pulse 74   Temp (!) 97 F (36.1 C) (Temporal)   Ht 5\' 3"  (1.6 m)   Wt 152 lb (68.9 kg)   SpO2 97%   BMI 26.93 kg/m      Assessment & Plan:   Taitum Leigh Toback comes in today with chief complaint of Medical Management of Chronic Issues and Fall (On10/31/24 hurt right knee and shoulder)   Diagnosis and orders addressed:  1. GAD (generalized anxiety  disorder) Will decrease xanax 0.25 mg to BID from TID  Will continue to decrease Follow up in 1 month  Continue Remeron and atarax as needed  - ALPRAZolam (XANAX) 0.25 MG tablet; Take 1 tablet (0.25 mg total) by mouth 2 (two) times daily as needed for anxiety.  Dispense: 60 tablet; Refill: 0  2. Vitamin D deficiency  3. Primary hypertension  4. Fall in home, initial encounter Referral to PT pending  - Ambulatory referral to Physical Therapy  5. Acute pain of right shoulder - Ambulatory referral to Physical Therapy  6. Gastroesophageal reflux disease without esophagitis Start Prilosec 20 mg  -Diet discussed- Avoid fried, spicy, citrus foods, caffeine and alcohol -Do not eat 2-3 hours before bedtime -Encouraged small frequent meals -Avoid NSAID's - omeprazole (PRILOSEC) 20 MG capsule; Take 1 capsule (20 mg total) by mouth daily.  Dispense: 90 capsule; Refill: 1  7. Anxiety  8. Other iron deficiency anemia  9. Insomnia, unspecified type   Labs reviewed, had labs drawn 12/04/22 from Oncologists  Health Maintenance reviewed Diet and exercise encouraged  Follow up plan: 1 month for follow up on GAD, Insomnia to continue to decrease xanax    Jannifer Rodney, FNP

## 2022-12-15 NOTE — Telephone Encounter (Signed)
Patient medication ALPRAZolam (XANAX) 0.25 MG tablet [[id number]  has a new dosage the pharmacy just wants to clarify which dose patient should be taking since there are several other doses on file josh from [facility] pharmacy would like a call back regarding this.

## 2022-12-15 NOTE — Telephone Encounter (Signed)
Pharmacy aware

## 2022-12-19 ENCOUNTER — Telehealth: Payer: Self-pay | Admitting: Family

## 2022-12-19 DIAGNOSIS — W19XXXA Unspecified fall, initial encounter: Secondary | ICD-10-CM

## 2022-12-19 NOTE — Telephone Encounter (Signed)
Pt aware to pay $5 to complete ppw.  Copied from CRM [id number]. Topic: General - Transportation >> Dec 19, 2022  1:55 PM Herbert Seta B wrote: Reason for CRM: PATIENT CALLING IN REGARDS TO NEEDING HANDICAPPED PARKING PLAQUES

## 2022-12-19 NOTE — Telephone Encounter (Signed)
Form completed and forwarded to PCP

## 2022-12-20 ENCOUNTER — Ambulatory Visit: Payer: Medicare Other | Admitting: Family Medicine

## 2022-12-20 NOTE — Telephone Encounter (Signed)
Aware handicap form ready

## 2022-12-27 ENCOUNTER — Other Ambulatory Visit: Payer: Self-pay

## 2022-12-27 ENCOUNTER — Ambulatory Visit: Payer: Medicare Other | Attending: Family

## 2022-12-27 DIAGNOSIS — W19XXXA Unspecified fall, initial encounter: Secondary | ICD-10-CM | POA: Diagnosis not present

## 2022-12-27 DIAGNOSIS — M25511 Pain in right shoulder: Secondary | ICD-10-CM | POA: Diagnosis not present

## 2022-12-27 DIAGNOSIS — Z9181 History of falling: Secondary | ICD-10-CM | POA: Insufficient documentation

## 2022-12-27 DIAGNOSIS — M6281 Muscle weakness (generalized): Secondary | ICD-10-CM | POA: Insufficient documentation

## 2022-12-27 DIAGNOSIS — M25611 Stiffness of right shoulder, not elsewhere classified: Secondary | ICD-10-CM | POA: Insufficient documentation

## 2022-12-27 DIAGNOSIS — Y92009 Unspecified place in unspecified non-institutional (private) residence as the place of occurrence of the external cause: Secondary | ICD-10-CM | POA: Insufficient documentation

## 2022-12-27 NOTE — Therapy (Signed)
OUTPATIENT PHYSICAL THERAPY SHOULDER EVALUATION   Patient Name: Amy Black MRN: [id number] DOB:26-Apr-1940, 82 y.o., female Today's Date: 12/27/2022  END OF SESSION:  PT End of Session - 12/27/22 1030     Visit Number 1    Number of Visits 12    Date for PT Re-Evaluation 03/09/23    PT Start Time 1032    PT Stop Time 1112    PT Time Calculation (min) 40 min    Activity Tolerance Patient tolerated treatment well    Behavior During Therapy [facility] for tasks assessed/performed             Past Medical History:  Diagnosis Date   Adenocarcinoma (HCC)    LEFT LEG   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 minimal CAD by cath in 2016 with presumed spasm (20% mLAD, 20% D2).   Cataract    bilateral cataracts removed   Coronary artery spasm (HCC)    Diverticulosis of colon (without mention of hemorrhage)    Femoral neck fracture, right, closed, initial encounter 10/04/2015   GERD (gastroesophageal reflux disease)    hepatic cyst    History of nuclear stress test 04/2011   lexiscan; normal study, no significant ischemia, low risk; 2015 - normal   HTN (hypertension)    PT. DENIES   Hyperlipidemia    Insomnia    Irritable bowel syndrome    Transverse myelitis (HCC)    Vitamin D deficiency    Past Surgical History:  Procedure Laterality Date   ABDOMINAL HYSTERECTOMY Bilateral    CARDIAC CATHETERIZATION     coronary spasm   CARDIAC CATHETERIZATION N/A 08/28/2014   Procedure: Left Heart Cath and Coronary Angiography;  Surgeon: Corky Crafts, MD;  Location: [facility] INVASIVE CV LAB;  Service: Cardiovascular;  Laterality: N/A;   COLONOSCOPY     DIAGNOSTIC LAPAROSCOPY     LYSIS OF ADHESION     PELVIC LAPAROSCOPY  1992   RSO, AND LSO ON 2007   RESECTION SOFT TISSUE TUMOR LEG / ANKLE RADICAL  2004   leg lesion resection & radiation   SALPINGOOPHORECTOMY Left    TOTAL HIP ARTHROPLASTY Right  10/04/2015   Procedure: TOTAL HIP ARTHROPLASTY ANTERIOR APPROACH;  Surgeon: Samson Frederic, MD;  Location: [facility] OR;  Service: Orthopedics;  Laterality: Right;   TRANSTHORACIC ECHOCARDIOGRAM  07/2012   LV cavity size mildly reduced, normal wall motion; MV with calcified annulus and mild MR; LA mildly dilated; atrial septum with increased thickness - lipomatous hypertrophy; RV systolic pressure increased (borderline pulm HTN)   UPPER GASTROINTESTINAL ENDOSCOPY     Patient Active Problem List   Diagnosis Date Noted   Seasonal allergic rhinitis 10/02/2022   Vitamin D deficiency 11/11/2021   Fecal smearing 02/14/2021   Other headache syndrome 07/03/2020   Herpes zoster dermatitis 02/16/2020   IDA (iron deficiency anemia) 05/07/2019   Palpitations 11/14/2018   Thrombocytosis 07/23/2015   Baker's cyst of knee 02/14/2013   Coronary artery spasm (HCC) 11/19/2012   Transverse myelitis (HCC) 10/10/2012   HTN (hypertension) 08/29/2012   Arthritis of neck 08/29/2012   Urinary retention 02/20/2012   Lower extremity weakness 02/20/2012   Adenocarcinoma of unknown primary (HCC) 02/06/2011   Anxiety 05/19/2010   Coronary atherosclerosis 12/03/2009   GERD 12/03/2009    PCP: Junie Spencer, FNP   REFERRING PROVIDER: Junie Spencer, FNP  REFERRING DIAG: Fall in home, initial encounter; Acute pain of right shoulder   THERAPY DIAG:  Acute pain of right shoulder  Stiffness of right shoulder, not elsewhere classified  History of falling  Muscle weakness (generalized)  Rationale for Evaluation and Treatment: Rehabilitation  ONSET DATE: 12/07/22  SUBJECTIVE:                                                                                                                                                                                      SUBJECTIVE STATEMENT: Patient reports that she was coming out of her bathroom when she was working on her right foot as it does not move as well as her left.  Her right shoulder has been bothering her since the fall. She can no longer reach overhead like she can with her left arm. She had never had any problems with her shoulder prior to this fall. She can no longer sleep on her right side which is her preferred side.  Hand dominance: Right  PERTINENT HISTORY: Hypertension, transverse myelitis, arthritis, anxiety, and allergies  PAIN:  Are you having pain? Yes: NPRS scale: 9/10 Pain location: right shoulder radiating to her wrist Pain description: intermittent throbbing Aggravating factors: reaching overhead, laying on her right side, bathing, and dressing Relieving factors: pain patch or cream  PRECAUTIONS: Fall  RED FLAGS: None   WEIGHT BEARING RESTRICTIONS: No  FALLS:  Has patient fallen in last 6 months? Yes. 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  PATIENT SURVEYS:  FOTO 41.59  COGNITION: Overall cognitive status: Within functional limits for tasks assessed     SENSATION: Patient reports no numbness or tingling  POSTURE: Forward head  UPPER EXTREMITY ROM:   Active ROM Right eval Left eval  Shoulder flexion 74; limited by pain  125  Shoulder extension    Shoulder abduction 59; limited by pain  102  Shoulder adduction    Shoulder internal rotation To sacrum To T11  Shoulder external rotation To clavicle  To T1  Elbow flexion    Elbow extension    Wrist flexion    Wrist extension    Wrist ulnar deviation     Wrist radial deviation  Wrist pronation    Wrist supination    (Blank rows = not tested)  UPPER EXTREMITY MMT:  MMT Right eval Left eval  Shoulder flexion  4+/5  Shoulder extension    Shoulder abduction  4-/5  Shoulder adduction    Shoulder internal rotation 3+/5 5/5  Shoulder external rotation 3+/5 4/5  Middle trapezius    Lower trapezius    Elbow flexion    Elbow extension    Wrist flexion    Wrist extension    Wrist ulnar deviation    Wrist radial deviation    Wrist pronation    Wrist supination    Grip strength (lbs) 20 25  (Blank rows = not tested)  LOWER EXTREMITY ROM: limited right hip flexion   LOWER EXTREMITY MMT:    MMT Right eval Left eval  Hip flexion 3/5 4-/5  Hip extension    Hip abduction    Hip adduction    Hip internal rotation    Hip external rotation    Knee flexion 3/5 4+/5  Knee extension 3/5 4-/5  Ankle dorsiflexion 3+/5 4-/5  Ankle plantarflexion    Ankle inversion    Ankle eversion     (Blank rows = not tested)   FUNCTIONAL TESTS:  Five time sit to stand: 19.29 seconds without UE support   Timed up and go: 24.14 seconds with cane  PALPATION:  TTP: right infraspinatus, deltoid, and triceps   TODAY'S TREATMENT:                                                                                                                                         DATE:    PATIENT EDUCATION: Education details: plan of care, prognosis, healing, objective findings, and goals for therapy Person educated: Patient Education method: Explanation Education comprehension: verbalized understanding  HOME EXERCISE PROGRAM:   ASSESSMENT:  CLINICAL IMPRESSION: Patient is a 82 y.o. female who was seen today for physical therapy evaluation and treatment for acute right shoulder pain secondary to a fall on 12/07/22. She presented with high pain severity and irritability with right shoulder active range of motion being the most aggravating to her familiar  symptoms. She is also a high fall risk as evidenced by her objective measures and her history of falling. Recommend that she continue with skilled physical therapy to address her impairments to maximize her safety and functional mobility.    OBJECTIVE IMPAIRMENTS: Abnormal gait, decreased activity tolerance, decreased balance, decreased mobility, difficulty walking, decreased ROM, decreased strength, hypomobility, impaired tone, impaired UE functional use, and pain.   ACTIVITY LIMITATIONS: carrying, lifting, standing, sleeping, stairs, transfers, bathing, toileting, dressing, reach over head, hygiene/grooming, locomotion level, and caring for others  PARTICIPATION LIMITATIONS: meal prep, cleaning, laundry, shopping, community activity, and yard work  PERSONAL FACTORS: Age, Past/current experiences, and 3+ comorbidities: Hypertension, transverse myelitis, arthritis, anxiety, and allergies  are also affecting patient's functional outcome.  REHAB POTENTIAL: Good  CLINICAL DECISION MAKING: Evolving/moderate complexity  EVALUATION COMPLEXITY: Moderate   GOALS: Goals reviewed with patient? Yes  SHORT TERM GOALS: Target date: 01/17/23  Patient will be independent with her initial HEP.  Baseline: Goal status: INITIAL  2.  Patient will be able to demonstrate at least 90 degrees of right shoulder flexion for improved function reaching.  Baseline:  Goal status: INITIAL  3.  Patient will improve her five time sit to stand time to 15 seconds or less to reduced her fall risk.  Baseline:  Goal status: INITIAL  4.  Patient will improve her timed up and go time to 19 seconds or less for improved functional mobility. Baseline:  Goal status: INITIAL  5.  Patient will be able to complete her daily activities without her familiar pain exceeding 7/10. Baseline:  Goal status: INITIAL  LONG TERM GOALS: Target date: 02/07/23  Patient will be independent with her advanced HEP.  Baseline:  Goal  status: INITIAL  2.  Patient will be able to demonstrate at least 120 degrees of active right shoulder flexion for improved function reaching overhead. Baseline:  Goal status: INITIAL  3.  Patient will be able to demonstrate at least 100 degrees of active right shoulder abduction for improved function reaching outside her base of support.  Baseline:  Goal status: INITIAL  4.  Patient will improve her timed up and go time to 15 seconds or less for improved safety with household mobility.  Baseline:  Goal status: INITIAL  5.  Patient will be able to complete her daily activities without her familiar pain exceeding 5/10.  Baseline:  Goal status: INITIAL  PLAN:  PT FREQUENCY: 2x/week  PT DURATION: 6 weeks  PLANNED INTERVENTIONS: 97164- PT Re-evaluation, 97110-Therapeutic exercises, 97530- Therapeutic activity, 97112- Neuromuscular re-education, 97535- Self Care, 66440- Manual therapy, [phone]- Gait training, 97014- Electrical stimulation (unattended), 97016- Vasopneumatic device, Patient/Family education, Balance training, Stair training, Joint mobilization, Cryotherapy, and Moist heat  PLAN FOR NEXT SESSION: Nustep, pulleys, right shoulder PROM, UE isometrics, LE strengthening, and modalities as needed    Granville Lewis, PT 12/27/2022, 11:56 AM

## 2022-12-29 ENCOUNTER — Ambulatory Visit: Payer: Medicare Other | Admitting: *Deleted

## 2022-12-29 ENCOUNTER — Encounter: Payer: Self-pay | Admitting: *Deleted

## 2022-12-29 DIAGNOSIS — M25611 Stiffness of right shoulder, not elsewhere classified: Secondary | ICD-10-CM | POA: Diagnosis not present

## 2022-12-29 DIAGNOSIS — Z9181 History of falling: Secondary | ICD-10-CM

## 2022-12-29 DIAGNOSIS — M25511 Pain in right shoulder: Secondary | ICD-10-CM | POA: Diagnosis not present

## 2022-12-29 DIAGNOSIS — M6281 Muscle weakness (generalized): Secondary | ICD-10-CM | POA: Diagnosis not present

## 2022-12-29 NOTE — Therapy (Signed)
OUTPATIENT PHYSICAL THERAPY SHOULDER TREATMENT   Patient Name: Amy Black MRN: [id number] DOB:July 24, 1940, 82 y.o., female Today's Date: 12/29/2022  END OF SESSION:  PT End of Session - 12/29/22 1156     Visit Number 2    Number of Visits 12    Date for PT Re-Evaluation 03/09/23    PT Start Time 1145    PT Stop Time 1236    PT Time Calculation (min) 51 min             Past Medical History:  Diagnosis Date   Adenocarcinoma (HCC)    LEFT LEG   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 minimal CAD by cath in 2016 with presumed spasm (20% mLAD, 20% D2).   Cataract    bilateral cataracts removed   Coronary artery spasm (HCC)    Diverticulosis of colon (without mention of hemorrhage)    Femoral neck fracture, right, closed, initial encounter 10/04/2015   GERD (gastroesophageal reflux disease)    hepatic cyst    History of nuclear stress test 04/2011   lexiscan; normal study, no significant ischemia, low risk; 2015 - normal   HTN (hypertension)    PT. DENIES   Hyperlipidemia    Insomnia    Irritable bowel syndrome    Transverse myelitis (HCC)    Vitamin D deficiency    Past Surgical History:  Procedure Laterality Date   ABDOMINAL HYSTERECTOMY Bilateral    CARDIAC CATHETERIZATION     coronary spasm   CARDIAC CATHETERIZATION N/A 08/28/2014   Procedure: Left Heart Cath and Coronary Angiography;  Surgeon: Corky Crafts, MD;  Location: [facility] INVASIVE CV LAB;  Service: Cardiovascular;  Laterality: N/A;   COLONOSCOPY     DIAGNOSTIC LAPAROSCOPY     LYSIS OF ADHESION     PELVIC LAPAROSCOPY  1992   RSO, AND LSO ON 2007   RESECTION SOFT TISSUE TUMOR LEG / ANKLE RADICAL  2004   leg lesion resection & radiation   SALPINGOOPHORECTOMY Left    TOTAL HIP ARTHROPLASTY Right 10/04/2015   Procedure: TOTAL HIP ARTHROPLASTY ANTERIOR APPROACH;  Surgeon: Samson Frederic, MD;  Location: [facility] OR;   Service: Orthopedics;  Laterality: Right;   TRANSTHORACIC ECHOCARDIOGRAM  07/2012   LV cavity size mildly reduced, normal wall motion; MV with calcified annulus and mild MR; LA mildly dilated; atrial septum with increased thickness - lipomatous hypertrophy; RV systolic pressure increased (borderline pulm HTN)   UPPER GASTROINTESTINAL ENDOSCOPY     Patient Active Problem List   Diagnosis Date Noted   Seasonal allergic rhinitis 10/02/2022   Vitamin D deficiency 11/11/2021   Fecal smearing 02/14/2021   Other headache syndrome 07/03/2020   Herpes zoster dermatitis 02/16/2020   IDA (iron deficiency anemia) 05/07/2019   Palpitations 11/14/2018   Thrombocytosis 07/23/2015   Baker's cyst of knee 02/14/2013   Coronary artery spasm (HCC) 11/19/2012   Transverse myelitis (HCC) 10/10/2012   HTN (hypertension) 08/29/2012   Arthritis of neck 08/29/2012   Urinary retention 02/20/2012   Lower extremity weakness 02/20/2012   Adenocarcinoma of unknown primary (HCC) 02/06/2011   Anxiety 05/19/2010   Coronary atherosclerosis 12/03/2009   GERD 12/03/2009    PCP: Junie Spencer, FNP   REFERRING PROVIDER: Junie Spencer, FNP   REFERRING DIAG: Fall in home, initial encounter; Acute pain of right shoulder   THERAPY DIAG:  Acute  pain of right shoulder  Stiffness of right shoulder, not elsewhere classified  History of falling  Muscle weakness (generalized)  Rationale for Evaluation and Treatment: Rehabilitation  ONSET DATE: 12/07/22  SUBJECTIVE:                                                                                                                                                                                      SUBJECTIVE STATEMENT: Patient reports RT shldr pain. 5/10 today Hand dominance: Right  PERTINENT HISTORY: Hypertension, transverse myelitis, arthritis, anxiety, and allergies  PAIN:  Are you having pain? Yes: NPRS scale: 5/10 Pain location: right shoulder  radiating to her wrist Pain description: intermittent throbbing Aggravating factors: reaching overhead, laying on her right side, bathing, and dressing Relieving factors: pain patch or cream  PRECAUTIONS: Fall  RED FLAGS: None   WEIGHT BEARING RESTRICTIONS: No  FALLS:  Has patient fallen in last 6 months? Yes. 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  PATIENT SURVEYS:  FOTO 41.59  COGNITION: Overall cognitive status: Within functional limits for tasks assessed     SENSATION: Patient reports no numbness or tingling  POSTURE: Forward head  UPPER EXTREMITY ROM:   Active ROM Right eval Left eval  Shoulder flexion 74; limited by pain  125  Shoulder extension    Shoulder abduction 59; limited by pain  102  Shoulder adduction    Shoulder internal rotation To sacrum To T11  Shoulder external rotation To clavicle  To T1  Elbow flexion    Elbow extension    Wrist flexion    Wrist extension    Wrist ulnar deviation    Wrist radial deviation    Wrist pronation    Wrist supination    (Blank rows = not tested)  UPPER EXTREMITY MMT:  MMT Right eval Left eval  Shoulder flexion  4+/5  Shoulder extension    Shoulder abduction  4-/5  Shoulder adduction    Shoulder internal rotation 3+/5 5/5  Shoulder external rotation 3+/5 4/5  Middle trapezius    Lower trapezius    Elbow flexion    Elbow extension    Wrist flexion    Wrist extension    Wrist  ulnar deviation     Wrist radial deviation    Wrist pronation    Wrist supination    Grip strength (lbs) 20 25  (Blank rows = not tested)  LOWER EXTREMITY ROM: limited right hip flexion   LOWER EXTREMITY MMT:    MMT Right eval Left eval  Hip flexion 3/5 4-/5  Hip extension    Hip abduction    Hip adduction    Hip internal rotation    Hip external rotation    Knee flexion 3/5 4+/5  Knee extension 3/5 4-/5  Ankle dorsiflexion 3+/5 4-/5  Ankle plantarflexion    Ankle inversion    Ankle eversion     (Blank rows = not tested)   FUNCTIONAL TESTS:  Five time sit to stand: 19.29 seconds without UE support   Timed up and go: 24.14 seconds with cane  PALPATION:  TTP: right infraspinatus, deltoid, and triceps   TODAY'S TREATMENT:                                                                                                                                         DATE:       12-29-22                                    EXERCISE LOG  Exercise Repetitions and Resistance Comments  Nustep L2 x 10 mins   pulleys X 3 mins   UE ranger  X 5 mins            Blank cell = exercise not performed today   Manual:  PROM for RT shldr elevation and ER  STW/TPR to RT UT Premod x 15 mins 80-150hz  to RT shldr   PATIENT EDUCATION: Education details: plan of care, prognosis, healing, objective findings, and goals for therapy Person educated: Patient Education method: Explanation Education comprehension: verbalized understanding  HOME EXERCISE PROGRAM:   ASSESSMENT:  CLINICAL IMPRESSION: Pt arrived today with RT shldr pain 5/10. Rx focused on AAROM exs f/b manual PROM and STW/ TPR to RT Utrap. Pt tolerated therex with mainly some soreness, but did well. Notable TP RT Utrap with good release. Premod/ HMP end of session    OBJECTIVE IMPAIRMENTS: Abnormal gait, decreased activity tolerance, decreased balance, decreased mobility, difficulty walking, decreased ROM, decreased strength, hypomobility,  impaired tone, impaired UE functional use, and pain.   ACTIVITY LIMITATIONS: carrying, lifting, standing, sleeping, stairs, transfers, bathing, toileting, dressing, reach over head, hygiene/grooming, locomotion level, and caring for others  PARTICIPATION LIMITATIONS: meal prep, cleaning, laundry, shopping, community activity, and yard work  PERSONAL FACTORS: Age, Past/current experiences, and 3+ comorbidities: Hypertension, transverse myelitis, arthritis, anxiety, and allergies  are also affecting patient's functional outcome.   REHAB POTENTIAL: Good  CLINICAL DECISION MAKING: Evolving/moderate complexity  EVALUATION COMPLEXITY: Moderate   GOALS: Goals reviewed  with patient? Yes  SHORT TERM GOALS: Target date: 01/17/23  Patient will be independent with her initial HEP.  Baseline: Goal status: INITIAL  2.  Patient will be able to demonstrate at least 90 degrees of right shoulder flexion for improved function reaching.  Baseline:  Goal status: INITIAL  3.  Patient will improve her five time sit to stand time to 15 seconds or less to reduced her fall risk.  Baseline:  Goal status: INITIAL  4.  Patient will improve her timed up and go time to 19 seconds or less for improved functional mobility. Baseline:  Goal status: INITIAL  5.  Patient will be able to complete her daily activities without her familiar pain exceeding 7/10. Baseline:  Goal status: INITIAL  LONG TERM GOALS: Target date: 02/07/23  Patient will be independent with her advanced HEP.  Baseline:  Goal status: INITIAL  2.  Patient will be able to demonstrate at least 120 degrees of active right shoulder flexion for improved function reaching overhead. Baseline:  Goal status: INITIAL  3.  Patient will be able to demonstrate at least 100 degrees of active right shoulder abduction for improved function reaching outside her base of support.  Baseline:  Goal status: INITIAL  4.  Patient will improve her timed up  and go time to 15 seconds or less for improved safety with household mobility.  Baseline:  Goal status: INITIAL  5.  Patient will be able to complete her daily activities without her familiar pain exceeding 5/10.  Baseline:  Goal status: INITIAL  PLAN:  PT FREQUENCY: 2x/week  PT DURATION: 6 weeks  PLANNED INTERVENTIONS: 97164- PT Re-evaluation, 97110-Therapeutic exercises, 97530- Therapeutic activity, 97112- Neuromuscular re-education, 97535- Self Care, 16109- Manual therapy, 97116- Gait training, 97014- Electrical stimulation (unattended), 97016- Vasopneumatic device, Patient/Family education, Balance training, Stair training, Joint mobilization, Cryotherapy, and Moist heat  PLAN FOR NEXT SESSION: Nustep, pulleys, right shoulder PROM, UE isometrics, LE strengthening, and modalities as needed    Sherell Christoffel,CHRIS, PTA 12/29/2022, 12:38 PM

## 2023-01-01 ENCOUNTER — Ambulatory Visit: Payer: Medicare Other

## 2023-01-01 ENCOUNTER — Ambulatory Visit (INDEPENDENT_AMBULATORY_CARE_PROVIDER_SITE_OTHER): Payer: Medicare Other | Admitting: Nurse Practitioner

## 2023-01-01 ENCOUNTER — Encounter: Payer: Self-pay | Admitting: Nurse Practitioner

## 2023-01-01 VITALS — BP 126/73 | HR 78 | Temp 97.3°F | Ht 63.0 in | Wt 150.2 lb

## 2023-01-01 DIAGNOSIS — R42 Dizziness and giddiness: Secondary | ICD-10-CM | POA: Diagnosis not present

## 2023-01-01 NOTE — Telephone Encounter (Signed)
Looks like Amy Black had a face-to-face visit and had the order for PT, I do not know if it was home health or not but I am okay placing the order based on that visit for home health physical therapy and Occupational Therapy based on Christy's last visit.

## 2023-01-01 NOTE — Progress Notes (Addendum)
Established Patient Office Visit  Subjective   Patient ID: Amy Black, female    DOB: Aug 22, 1940  Age: 82 y.o. MRN: [id number]  Chief Complaint  Patient presents with   Dizziness    Has been feeling dizzy for 2 weeks. She said it comes and goes     HPI  Amy Black present January 01, 2023 for an acute visit concern for dizziness.  Past medical history of coronary artery spasm, atherosclerosis, hypertension, iron deficiency anemia, adenocarcinoma palpitation. Reports has been on Xanax for over 25 he is not being tapered down with the goal to wean off Xanax; was started on Remeron 7.5 mg during the weaning process.   Reports since starting  mirtazapine she has not been able to sleep, "I stay awake all night" and woke up in the morning intake Xanax "sometimes I need more than 1 to get:" And has been experiencing dizzy spell even when sitting down.  She denied any change in vision during the spell or shortness of breath or chest pain or the sensation that the room is spinning "I know it is not vertigo".  Client also reports that she has been drinking a lot of sweet tea with minimal water.  So we will check BMP to rule out possible dehydration.  Past Surgical History:  Procedure Laterality Date   ABDOMINAL HYSTERECTOMY Bilateral    CARDIAC CATHETERIZATION     coronary spasm   CARDIAC CATHETERIZATION N/A 08/28/2014   Procedure: Left Heart Cath and Coronary Angiography;  Surgeon: Corky Crafts, MD;  Location: [facility] INVASIVE CV LAB;  Service: Cardiovascular;  Laterality: N/A;   COLONOSCOPY     DIAGNOSTIC LAPAROSCOPY     LYSIS OF ADHESION     PELVIC LAPAROSCOPY  1992   RSO, AND LSO ON 2007   RESECTION SOFT TISSUE TUMOR LEG / ANKLE RADICAL  2004   leg lesion resection & radiation   SALPINGOOPHORECTOMY Left    TOTAL HIP ARTHROPLASTY Right 10/04/2015   Procedure: TOTAL HIP ARTHROPLASTY ANTERIOR APPROACH;  Surgeon: Samson Frederic, MD;  Location: [facility] OR;  Service: Orthopedics;   Laterality: Right;   TRANSTHORACIC ECHOCARDIOGRAM  07/2012   LV cavity size mildly reduced, normal wall motion; MV with calcified annulus and mild MR; LA mildly dilated; atrial septum with increased thickness - lipomatous hypertrophy; RV systolic pressure increased (borderline pulm HTN)   UPPER GASTROINTESTINAL ENDOSCOPY     Social History   Tobacco Use   Smoking status: Former    Current packs/day: 0.00    Average packs/day: 0.5 packs/day for 4.0 years (2.0 ttl pk-yrs)    Types: Cigarettes    Start date: 12/07/1968    Quit date: 11/20/1972    Years since quitting: 50.1   Smokeless tobacco: Never   Tobacco comments:    quit 40 years ago  Vaping Use   Vaping status: Never Used  Substance Use Topics   Alcohol use: No    Alcohol/week: 0.0 standard drinks of alcohol   Drug use: No   Social History   Socioeconomic History   Marital status: Married    Spouse name: Amy Black   Number of children: 2   Years of education: 12   Highest education level: High school graduate  Occupational History   Occupation: Disabilty   Occupation: DISABILITY    Employer: UNEMPLOYED  Tobacco Use   Smoking status: Former    Current packs/day: 0.00    Average packs/day: 0.5 packs/day for 4.0 years (2.0 ttl pk-yrs)  Types: Cigarettes    Start date: 12/07/1968    Quit date: 11/20/1972    Years since quitting: 50.1   Smokeless tobacco: Never   Tobacco comments:    quit 40 years ago  Vaping Use   Vaping status: Never Used  Substance and Sexual Activity   Alcohol use: No    Alcohol/week: 0.0 standard drinks of alcohol   Drug use: No   Sexual activity: Not Currently    Birth control/protection: Surgical  Other Topics Concern   Not on file  Social History Narrative   Disabled, lives with husband and daughter.  Enjoys shopping. Lives in two story home   1 son and 1 daughter.   1 grandson.   Caffeine 2 cans of soda and tea   Right handed   Social Determinants of Health   Financial Resource  Strain: Low Risk  (03/22/2022)   Overall Financial Resource Strain (CARDIA)    Difficulty of Paying Living Expenses: Not hard at all  Food Insecurity: No Food Insecurity (03/22/2022)   Hunger Vital Sign    Worried About Running Out of Food in the Last Year: Never true    Ran Out of Food in the Last Year: Never true  Transportation Needs: No Transportation Needs (03/22/2022)   PRAPARE - Administrator, Civil Service (Medical): No    Lack of Transportation (Non-Medical): No  Physical Activity: Inactive (03/22/2022)   Exercise Vital Sign    Days of Exercise per Week: 0 days    Minutes of Exercise per Session: 0 min  Stress: No Stress Concern Present (03/22/2022)   Amy Black    Feeling of Stress : Not at all  Social Connections: Moderately Integrated (03/22/2022)   Social Connection and Isolation Panel [NHANES]    Frequency of Communication with Friends and Family: More than three times a week    Frequency of Social Gatherings with Friends and Family: More than three times a week    Attends Religious Services: More than 4 times per year    Active Member of [employer] or Organizations: No    Attends Banker Meetings: Never    Marital Status: Married  Catering manager Violence: Not At Risk (03/22/2022)   Humiliation, Afraid, Rape, and Kick Black    Fear of Current or Ex-Partner: No    Emotionally Abused: No    Physically Abused: No    Sexually Abused: No   Family Status  Relation Name Status   Mother  Deceased at age 70   Father  Deceased at age 49   Sister  Alive   Daughter  Alive   MGM  Deceased   MGF  Deceased   PGM  Deceased   PGF  Deceased   Brother  Deceased at age 64   Brother  Alive, age 69y   Brother  Alive   Son  Armed forces training and education officer   Other  (Not Specified)   Neg Hx  (Not Specified)  No partnership data on file   Family History  Problem Relation Age of Onset   Bladder Cancer Mother     Heart disease Mother    Hypertension Mother    Heart disease Father    Stroke Father    Hyperlipidemia Sister    Depression Sister    Diabetes Brother    Seizures Brother    Hypertension Brother    Diabetes Other        Multiple family members on  both sides    Coronary artery disease Other        Multiple family members on both sides    Colon cancer Neg Hx    Esophageal cancer Neg Hx    Pancreatic cancer Neg Hx    Rectal cancer Neg Hx    Stomach cancer Neg Hx    Breast cancer Neg Hx    Allergies  Allergen Reactions   Iodine Itching   Ivp Dye [Iodinated Contrast Media] Itching   Amlodipine Other (See Comments)    headaches   Lexapro [Escitalopram Oxalate] Other (See Comments)    Drunk, High feeling   Cymbalta [Duloxetine Hcl] Other (See Comments)    sleepiness   Gabapentin Other (See Comments)    sleepiness   Zanaflex [Tizanidine Hcl] Other (See Comments)    Very drunk feeling next day      ROS Negative unless indicated in HPI   Objective:     BP 126/73   Pulse 78   Temp (!) 97.3 F (36.3 C) (Temporal)   Ht 5\' 3"  (1.6 m)   Wt 150 lb 3.2 oz (68.1 kg)   SpO2 99%   BMI 26.61 kg/m  BP Readings from Last 3 Encounters:  01/01/23 126/73  12/15/22 125/80  12/04/22 139/75   Wt Readings from Last 3 Encounters:  01/01/23 150 lb 3.2 oz (68.1 kg)  12/15/22 152 lb (68.9 kg)  12/04/22 151 lb 1.9 oz (68.5 kg)      Physical Exam Vitals and nursing note reviewed.  Constitutional:      Appearance: Normal appearance.  HENT:     Head: Normocephalic and atraumatic.     Right Ear: Tympanic membrane, ear canal and external ear normal. There is no impacted cerumen.     Left Ear: Tympanic membrane, ear canal and external ear normal. There is no impacted cerumen.     Nose: No congestion or rhinorrhea.  Eyes:     Extraocular Movements: Extraocular movements intact.     Conjunctiva/sclera: Conjunctivae normal.     Pupils: Pupils are equal, round, and reactive to light.   Cardiovascular:     Rate and Rhythm: Normal rate and regular rhythm.  Pulmonary:     Effort: Pulmonary effort is normal.     Breath sounds: Normal breath sounds.  Musculoskeletal:        General: No swelling or tenderness.     Right lower leg: No edema.     Left lower leg: No edema.  Skin:    General: Skin is warm and dry.     Findings: No rash.  Neurological:     Mental Status: She is alert and oriented to person, place, and time. Mental status is at baseline.  Psychiatric:        Mood and Affect: Mood normal.        Behavior: Behavior normal.        Thought Content: Thought content normal.        Judgment: Judgment normal.      No results found for any visits on 01/01/23.  Last CBC Lab Results  Component Value Date   WBC 5.9 12/04/2022   HGB 11.2 (L) 12/04/2022   HCT 34.1 (L) 12/04/2022   MCV 93.2 12/04/2022   MCH 30.6 12/04/2022   RDW 12.9 12/04/2022   PLT 418 (H) 12/04/2022   Last metabolic panel Lab Results  Component Value Date   GLUCOSE 87 12/04/2022   NA 137 12/04/2022   K 4.4 12/04/2022  CL 102 12/04/2022   CO2 28 12/04/2022   BUN 15 12/04/2022   CREATININE 0.89 12/04/2022   GFRNONAA >60 12/04/2022   CALCIUM 9.7 12/04/2022   PROT 7.0 12/04/2022   ALBUMIN 4.5 12/04/2022   LABGLOB 2.6 11/11/2021   AGRATIO 1.6 11/11/2021   BILITOT 0.3 12/04/2022   ALKPHOS 85 12/04/2022   AST 16 12/04/2022   ALT 12 12/04/2022   ANIONGAP 7 12/04/2022   Last lipids Lab Results  Component Value Date   CHOL 177 06/07/2021   HDL 68 06/07/2021   LDLCALC 92 06/07/2021   TRIG 96 06/07/2021   CHOLHDL 2.6 06/07/2021   Last thyroid functions Lab Results  Component Value Date   TSH 0.039 (L) 03/03/2022   T4TOTAL 7.6 11/11/2021        Assessment & Plan:  Dizzy spells -     CMP14+EGFR   Amy Black is a 82 year old African-American female, no acute distress She has been weaned off Xanax sertraline dose was decreased to 0.25 mg twice daily, mirtazapine  7.5 mg  was added to improve sleep. Advised clients here take Xanax after eating breakfast instead of first thing in the morning and see if there is any improvement in the dizziness.  Will CMP to rule out dehydration and client will follow-up with PCP as already scheduled  Encourage healthy lifestyle choices, including diet (rich in fruits, vegetables, and lean proteins, and low in salt and simple carbohydrates) and exercise (at least 30 minutes of moderate physical activity daily).     The above assessment and management plan was discussed with the patient. The patient verbalized understanding of and has agreed to the management plan. Patient is aware to call the clinic if they develop any new symptoms or if symptoms persist or worsen. Patient is aware when to return to the clinic for a follow-up visit. Patient educated on when it is appropriate to go to the emergency department.  Return for as already scheduled with PCP.    Amy Aran Santa Lighter, DNP [facility] [address] [place], [place] [address] [phone]

## 2023-01-01 NOTE — Telephone Encounter (Signed)
Copied from CRM [id number]. Topic: Clinical - Home Health Verbal Orders >> Jan 01, 2023  8:20 AM Theodis Sato wrote: Reason for CRM: Minna Merritts from [facility] states that Pt needs a new order from [employer] that states PT must be able to have  Physical therapy and Occupational therapy in side of her home as she cannot leave her husband beacause of his dementia.

## 2023-01-01 NOTE — Telephone Encounter (Addendum)
Pt referral has been placed by [facility]  OT placed today per [place].  Pt request that therapy be provided in her home since her husband has dementia and she cannot leave him.

## 2023-01-01 NOTE — Addendum Note (Signed)
Addended by: Dorene Sorrow on: 01/01/2023 03:39 PM   Modules accepted: Orders

## 2023-01-02 LAB — CMP14+EGFR
ALT: 14 [IU]/L (ref 0–32)
AST: 18 [IU]/L (ref 0–40)
Albumin: 4.4 g/dL (ref 3.8–4.8)
Alkaline Phosphatase: 117 [IU]/L (ref 44–121)
BUN/Creatinine Ratio: 10 — ABNORMAL LOW (ref 12–28)
BUN: 10 mg/dL (ref 8–27)
Bilirubin Total: 0.3 mg/dL (ref 0.0–1.2)
CO2: 25 mmol/L (ref 20–29)
Calcium: 9.7 mg/dL (ref 8.7–10.3)
Chloride: 100 mmol/L (ref 96–106)
Creatinine, Ser: 0.97 mg/dL (ref 0.57–1.00)
Globulin, Total: 2.6 g/dL (ref 1.5–4.5)
Glucose: 71 mg/dL (ref 70–99)
Potassium: 4.8 mmol/L (ref 3.5–5.2)
Sodium: 137 mmol/L (ref 134–144)
Total Protein: 7 g/dL (ref 6.0–8.5)
eGFR: 59 mL/min/{1.73_m2} — ABNORMAL LOW (ref >59)

## 2023-01-08 ENCOUNTER — Other Ambulatory Visit: Payer: Self-pay | Admitting: Family

## 2023-01-08 DIAGNOSIS — F411 Generalized anxiety disorder: Secondary | ICD-10-CM

## 2023-01-10 ENCOUNTER — Ambulatory Visit: Payer: Medicare Other | Attending: Family

## 2023-01-10 DIAGNOSIS — M25611 Stiffness of right shoulder, not elsewhere classified: Secondary | ICD-10-CM | POA: Insufficient documentation

## 2023-01-10 DIAGNOSIS — Z9181 History of falling: Secondary | ICD-10-CM | POA: Insufficient documentation

## 2023-01-10 DIAGNOSIS — M6281 Muscle weakness (generalized): Secondary | ICD-10-CM | POA: Diagnosis not present

## 2023-01-10 DIAGNOSIS — M25511 Pain in right shoulder: Secondary | ICD-10-CM | POA: Diagnosis not present

## 2023-01-10 NOTE — Therapy (Signed)
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 minimal CAD by cath in 2016 with presumed spasm (20% mLAD, 20% D2).   Cataract    bilateral cataracts removed   Coronary artery spasm (HCC)    Diverticulosis of colon (without mention of hemorrhage)    Femoral neck fracture, right, closed, initial encounter 10/04/2015   GERD (gastroesophageal reflux disease)    hepatic cyst    History of nuclear stress test 04/2011   lexiscan; normal study, no significant ischemia, low risk; 2015 - normal   HTN (hypertension)    PT. DENIES   Hyperlipidemia    Insomnia    Irritable bowel syndrome    Transverse myelitis (HCC)    Vitamin D deficiency    Past Surgical History:  Procedure Laterality Date   ABDOMINAL HYSTERECTOMY Bilateral    CARDIAC CATHETERIZATION     coronary spasm   CARDIAC CATHETERIZATION N/A 08/28/2014   Procedure: Left Heart Cath and Coronary Angiography;  Surgeon: Corky Crafts, MD;  Location: [facility] INVASIVE CV LAB;  Service: Cardiovascular;  Laterality: N/A;   COLONOSCOPY     DIAGNOSTIC LAPAROSCOPY     LYSIS OF ADHESION     PELVIC LAPAROSCOPY  1992   RSO, AND LSO ON 2007   RESECTION SOFT TISSUE TUMOR LEG / ANKLE RADICAL  2004   leg lesion resection & radiation   SALPINGOOPHORECTOMY Left    TOTAL HIP ARTHROPLASTY Right 10/04/2015   Procedure: TOTAL HIP ARTHROPLASTY ANTERIOR APPROACH;  Surgeon: Samson Frederic, MD;  Location: [facility] OR;   Service: Orthopedics;  Laterality: Right;   TRANSTHORACIC ECHOCARDIOGRAM  07/2012   LV cavity size mildly reduced, normal wall motion; MV with calcified annulus and mild MR; LA mildly dilated; atrial septum with increased thickness - lipomatous hypertrophy; RV systolic pressure increased (borderline pulm HTN)   UPPER GASTROINTESTINAL ENDOSCOPY     Patient Active Problem List   Diagnosis Date Noted   Dizzy spells 01/01/2023   Seasonal allergic rhinitis 10/02/2022   Vitamin D deficiency 11/11/2021   Fecal smearing 02/14/2021   Other headache syndrome 07/03/2020   Herpes zoster dermatitis 02/16/2020   IDA (iron deficiency anemia) 05/07/2019   Palpitations 11/14/2018   Thrombocytosis 07/23/2015   Baker's cyst of knee 02/14/2013   Coronary artery spasm (HCC) 11/19/2012   Transverse myelitis (HCC) 10/10/2012   HTN (hypertension) 08/29/2012   Arthritis of neck 08/29/2012   Urinary retention 02/20/2012   Lower extremity weakness 02/20/2012   Adenocarcinoma of unknown primary (HCC) 02/06/2011   Anxiety 05/19/2010   Coronary atherosclerosis 12/03/2009   GERD 12/03/2009    PCP: Junie Spencer, FNP   REFERRING PROVIDER: Junie Spencer, FNP   REFERRING DIAG: Fall in home, initial encounter; Acute pain of right shoulder  THERAPY DIAG:  Acute pain of right shoulder  Stiffness of right shoulder, not elsewhere classified  History of falling  Muscle weakness (generalized)  Rationale for Evaluation and Treatment: Rehabilitation  ONSET DATE: 12/07/22  SUBJECTIVE:                                                                                                                                                                                      SUBJECTIVE STATEMENT: Patient denies any pain today.  Hand dominance: Right  PERTINENT HISTORY: Hypertension, transverse myelitis, arthritis, anxiety, and allergies  PAIN:  Are you having pain? No  PRECAUTIONS: Fall  RED  FLAGS: None   WEIGHT BEARING RESTRICTIONS: No  FALLS:  Has patient fallen in last 6 months? Yes. 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  PATIENT SURVEYS:  FOTO 41.59  COGNITION: Overall cognitive status: Within functional limits for tasks assessed     SENSATION: Patient reports no numbness or tingling  POSTURE: Forward head  UPPER EXTREMITY ROM:   Active ROM Right eval Left eval  Shoulder flexion 74; limited by pain  125  Shoulder extension    Shoulder abduction 59; limited by pain  102  Shoulder adduction    Shoulder internal rotation To sacrum To T11  Shoulder external rotation To clavicle  To T1  Elbow flexion    Elbow extension    Wrist flexion    Wrist extension    Wrist ulnar deviation    Wrist radial deviation    Wrist pronation    Wrist supination    (Blank rows = not tested)  UPPER EXTREMITY MMT:  MMT Right eval Left eval  Shoulder flexion  4+/5  Shoulder extension    Shoulder abduction  4-/5  Shoulder adduction    Shoulder internal rotation 3+/5 5/5  Shoulder external rotation 3+/5 4/5  Middle trapezius    Lower trapezius    Elbow flexion    Elbow extension    Wrist flexion    Wrist extension    Wrist ulnar deviation    Wrist radial deviation    Wrist pronation    Wrist supination    Grip strength (lbs) 20 25  (Blank rows =  not tested)  LOWER EXTREMITY ROM: limited right hip flexion   LOWER EXTREMITY MMT:    MMT Right eval  Left eval  Hip flexion 3/5 4-/5  Hip extension    Hip abduction    Hip adduction    Hip internal rotation    Hip external rotation    Knee flexion 3/5 4+/5  Knee extension 3/5 4-/5  Ankle dorsiflexion 3+/5 4-/5  Ankle plantarflexion    Ankle inversion    Ankle eversion     (Blank rows = not tested)   FUNCTIONAL TESTS:  Five time sit to stand: 19.29 seconds without UE support   Timed up and go: 24.14 seconds with cane  PALPATION:  TTP: right infraspinatus, deltoid, and triceps   TODAY'S TREATMENT:                                                                                                                                         DATE:                    01/10/23                   EXERCISE LOG  Exercise Repetitions and Resistance Comments  Nustep L3 x 15 mins   Pulleys 4 mins   UE ranger Flex/ext; CW and CCW x 2 mins each   LAQs 2# x 20 reps bil   Seated marches 2# x 20 reps bil    Blank cell = exercise not performed today   Manual Therapy Soft Tissue Mobilization: right shoulder, STW/M to right bicep and deltoid to decrease pain and tone    Modalities  Date:  Unattended Estim: Shoulder, IFC 80-150 Hz, 15 mins, Pain Hot Pack: Shoulder, 15 mins, Pain and Tone    PATIENT EDUCATION: Education details: plan of care, prognosis, healing, objective findings, and goals for therapy Person educated: Patient Education method: Explanation Education comprehension: verbalized understanding  HOME EXERCISE PROGRAM:   ASSESSMENT:  CLINICAL IMPRESSION:  Pt arrives for today's treatment session reporting minimal to no pain.  Pt able to tolerate increased time on Nustep and all other exercises/activities today.  Pt introduced to seated LAQs and seated marches with good results.  Pt requiring min cues for proper technique and posture.  STW/M performed to right bicep and deltoid to decrease pain and tone.  Normal responses to estim and MH noted upon removal.  Pt reported decreased  pain and tone at completion of today's treatment session.  OBJECTIVE IMPAIRMENTS: Abnormal gait, decreased activity tolerance, decreased balance, decreased mobility, difficulty walking, decreased ROM, decreased strength, hypomobility, impaired tone, impaired UE functional use, and pain.   ACTIVITY LIMITATIONS: carrying, lifting, standing, sleeping, stairs, transfers, bathing, toileting, dressing, reach over head, hygiene/grooming, locomotion level, and caring for others  PARTICIPATION LIMITATIONS: meal prep, cleaning, laundry, shopping, community activity, and yard work  PERSONAL FACTORS: Age, Past/current experiences,  and 3+ comorbidities: Hypertension, transverse myelitis, arthritis, anxiety, and allergies  are also affecting patient's functional outcome.   REHAB POTENTIAL: Good  CLINICAL DECISION MAKING: Evolving/moderate complexity  EVALUATION COMPLEXITY: Moderate   GOALS: Goals reviewed with patient? Yes  SHORT TERM GOALS: Target date: 01/17/23  Patient will be independent with her initial HEP.  Baseline: Goal status: INITIAL  2.  Patient will be able to demonstrate at least 90 degrees of right shoulder flexion for improved function reaching.  Baseline:  Goal status: INITIAL  3.  Patient will improve her five time sit to stand time to 15 seconds or less to reduced her fall risk.  Baseline:  Goal status: INITIAL  4.  Patient will improve her timed up and go time to 19 seconds or less for improved functional mobility. Baseline:  Goal status: INITIAL  5.  Patient will be able to complete her daily activities without her familiar pain exceeding 7/10. Baseline:  Goal status: INITIAL  LONG TERM GOALS: Target date: 02/07/23  Patient will be independent with her advanced HEP.  Baseline:  Goal status: INITIAL  2.  Patient will be able to demonstrate at least 120 degrees of active right shoulder flexion for improved function reaching overhead. Baseline:  Goal status:  INITIAL  3.  Patient will be able to demonstrate at least 100 degrees of active right shoulder abduction for improved function reaching outside her base of support.  Baseline:  Goal status: INITIAL  4.  Patient will improve her timed up and go time to 15 seconds or less for improved safety with household mobility.  Baseline:  Goal status: INITIAL  5.  Patient will be able to complete her daily activities without her familiar pain exceeding 5/10.  Baseline:  Goal status: INITIAL  PLAN:  PT FREQUENCY: 2x/week  PT DURATION: 6 weeks  PLANNED INTERVENTIONS: 97164- PT Re-evaluation, 97110-Therapeutic exercises, 97530- Therapeutic activity, 97112- Neuromuscular re-education, 97535- Self Care, 82956- Manual therapy, [phone]- Gait training, 97014- Electrical stimulation (unattended), 97016- Vasopneumatic device, Patient/Family education, Balance training, Stair training, Joint mobilization, Cryotherapy, and Moist heat  PLAN FOR NEXT SESSION: Nustep, pulleys, right shoulder PROM, UE isometrics, LE strengthening, and modalities as needed    Newman Pies, PTA 01/10/2023, 2:57 PM

## 2023-01-12 ENCOUNTER — Other Ambulatory Visit: Payer: Self-pay | Admitting: Family

## 2023-01-12 DIAGNOSIS — F411 Generalized anxiety disorder: Secondary | ICD-10-CM

## 2023-01-12 NOTE — Telephone Encounter (Signed)
Copied from CRM [id number]. Topic: Clinical - Medication Refill >> Jan 12, 2023 12:39 PM [name] wrote: Most Recent Primary Care Visit:  Provider: [facility], [place]  Department: [facility] MED  Visit Type: OFFICE VISIT  Date: 01/01/2023  Medication: Xanax  Has the patient contacted their pharmacy? Yes (Agent: If no, request that the patient contact the pharmacy for the refill. If patient does not wish to contact the pharmacy document the reason why and proceed with request.) (Agent: If yes, when and what did the pharmacy advise?)  Is this the correct pharmacy for this prescription? Yes; [place]  If no, delete pharmacy and type the correct one.  This is the patient's preferred pharmacy:  [facility] [place], [place] - [address] [place] [address] Phone: [phone] Fax: [phone]  [id number] - [place], [place] - [address] [facility] [address] [place] [address] Phone: [phone] Fax: [phone]   Has the prescription been filled recently? No  Is the patient out of the medication? Yes  Has the patient been seen for an appointment in the last year OR does the patient have an upcoming appointment? Yes  Can we respond through MyChart? No  Agent: Please be advised that Rx refills may take up to 3 business days. We ask that you follow-up with your pharmacy.

## 2023-01-15 ENCOUNTER — Telehealth: Payer: Self-pay | Admitting: Family

## 2023-01-15 ENCOUNTER — Telehealth: Payer: Self-pay | Admitting: Family Medicine

## 2023-01-15 ENCOUNTER — Other Ambulatory Visit: Payer: Self-pay | Admitting: Family

## 2023-01-15 DIAGNOSIS — F411 Generalized anxiety disorder: Secondary | ICD-10-CM

## 2023-01-15 DIAGNOSIS — M62838 Other muscle spasm: Secondary | ICD-10-CM

## 2023-01-15 MED ORDER — BACLOFEN 10 MG PO TABS: 5.0000 mg | ORAL_TABLET | Freq: Two times a day (BID) | ORAL | 2 refills | Status: DC | PRN

## 2023-01-15 NOTE — Telephone Encounter (Signed)
Copied from CRM [id number]. Topic: Clinical - Medication Refill >> Jan 15, 2023  3:03 PM Tiffany H wrote: Most Recent Primary Care Visit:  Provider: [facility], [place]  Department: [facility] MED  Visit Type: OFFICE VISIT  Date: 01/01/2023  Medication: baclofen (LIORESAL) 10 MG tablet   Has the patient contacted their pharmacy? Yes (Agent: If no, request that the patient contact the pharmacy for the refill. If patient does not wish to contact the pharmacy document the reason why and proceed with request.) (Agent: If yes, when and what did the pharmacy advise?)  Is this the correct pharmacy for this prescription? Yes If no, delete pharmacy and type the correct one.  This is the patient's preferred pharmacy:   [facility] [place], [place] - [address] [place] [address] Phone: [phone] Fax: [phone]   Has the prescription been filled recently? No  Is the patient out of the medication? Yes  Has the patient been seen for an appointment in the last year OR does the patient have an upcoming appointment? Yes  Can we respond through MyChart? No  Agent: Please be advised that Rx refills may take up to 3 business days. We ask that you follow-up with your pharmacy.

## 2023-01-15 NOTE — Telephone Encounter (Signed)
Baclofen Prescription sent to pharmacy  ? ?

## 2023-01-15 NOTE — Telephone Encounter (Signed)
Pt needs to r/s apt with Christy on 01/18/2023. Neysa Bonito will not be in the office.

## 2023-01-15 NOTE — Telephone Encounter (Signed)
Patient aware.

## 2023-01-16 ENCOUNTER — Other Ambulatory Visit: Payer: Self-pay | Admitting: Family

## 2023-01-16 DIAGNOSIS — F411 Generalized anxiety disorder: Secondary | ICD-10-CM

## 2023-01-16 NOTE — Telephone Encounter (Signed)
Copied from CRM [id number]. Topic: Clinical - Medication Refill >> Jan 16, 2023  4:52 PM Fuller Mandril wrote: Most Recent Primary Care Visit:  Provider: [facility], [place]  Department: [facility] MED  Visit Type: OFFICE VISIT  Date: 01/01/2023  Medication: ***  Has the patient contacted their pharmacy?  (Agent: If no, request that the patient contact the pharmacy for the refill. If patient does not wish to contact the pharmacy document the reason why and proceed with request.) (Agent: If yes, when and what did the pharmacy advise?)  Is this the correct pharmacy for this prescription?  If no, delete pharmacy and type the correct one.  This is the patient's preferred pharmacy:  [facility] [place], [place] - [address] [place] [address] Phone: [phone] Fax: [phone]  [id number] - [place], [place] - [address] [facility] [address] [place] [address] Phone: [phone] Fax: [phone]   Has the prescription been filled recently?   Is the patient out of the medication?   Has the patient been seen for an appointment in the last year OR does the patient have an upcoming appointment?   Can we respond through MyChart?   Agent: Please be advised that Rx refills may take up to 3 business days. We ask that you follow-up with your pharmacy.

## 2023-01-18 ENCOUNTER — Encounter: Payer: Medicare Other | Admitting: Physical Therapy

## 2023-01-18 ENCOUNTER — Ambulatory Visit: Payer: Self-pay | Admitting: Family

## 2023-01-18 ENCOUNTER — Ambulatory Visit: Payer: Medicare Other | Admitting: Family

## 2023-01-18 ENCOUNTER — Other Ambulatory Visit: Payer: Self-pay | Admitting: Family

## 2023-01-18 DIAGNOSIS — F411 Generalized anxiety disorder: Secondary | ICD-10-CM

## 2023-01-18 NOTE — Telephone Encounter (Signed)
Copied from CRM [id number]. Topic: Clinical - Pink Word Triage >> Jan 18, 2023  5:18 PM [name] wrote: Reason for Triage: she is feeling dizzy, and a little lightheaded - Both comes and goes - She would like to have an EKG to make sure she is okay. She has been a heart patient since 32. Please call her at [phone]   Chief Complaint: Dizziness and lightheadedness Symptoms: Dizziness and Lightheadedness Frequency: Acute Pertinent Negatives: Patient denies sedating medications, chest pain, dyspnea Disposition: [] ED /[] Urgent Care (no appt availability in office) / [] Appointment(In office/virtual)/ []  [facility]/ [] Home Care/ [] Refused Recommended Disposition /[] [facility]/ []  Follow-up with PCP Additional Notes: Amy Black is an 82 year old female triaged today regarding dizziness and lightheadedness over the past month. The patient states the dizziness has been present since November, but has increased in frequency since December. The patient denies any acute cardiac or respiratory symptoms otherwise, and the patient denies taking any sedating medications. The patient is mostly concerned about the increase in frequency. In office appointment scheduled for tomorrow.     Reason for Disposition  [1] MILD dizziness (e.g., walking normally) AND [2] has NOT been evaluated by doctor (or NP/PA) for this  (Exception: Dizziness caused by heat exposure, sudden standing, or poor fluid intake.)  Answer Assessment - Initial Assessment Questions 1. DESCRIPTION: "Describe your dizziness."     "Feels like I am going to fall." 2. LIGHTHEADED: "Do you feel lightheaded?" (e.g., somewhat faint, woozy, weak upon standing)     Lightheaded 3. VERTIGO: "Do you feel like either you or the room is spinning or tilting?" (i.e. vertigo)     Yes 4. SEVERITY: "How bad is it?"  "Do you feel like you are going to faint?" "Can you stand and walk?"   - MILD: Feels slightly dizzy, but walking  normally.   - MODERATE: Feels unsteady when walking, but not falling; interferes with normal activities (e.g., school, work).   - SEVERE: Unable to walk without falling, or requires assistance to walk without falling; feels like passing out now.      Mild 5. ONSET:  "When did the dizziness begin?"     Yesterday 6. AGGRAVATING FACTORS: "Does anything make it worse?" (e.g., standing, change in head position)     Denies aggravating factors.  7. HEART RATE: "Can you tell me your heart rate?" "How many beats in 15 seconds?"  (Note: not all patients can do this)       Unsure.  8. CAUSE: "What do you think is causing the dizziness?"     Unsure 9. RECURRENT SYMPTOM: "Have you had dizziness before?" If Yes, ask: "When was the last time?" "What happened that time?"     Has come and gone since November, December has brought on an increase in frequency. 10. OTHER SYMPTOMS: "Do you have any other symptoms?" (e.g., fever, chest pain, vomiting, diarrhea, bleeding)       No other symptoms.  11. PREGNANCY: "Is there any chance you are pregnant?" "When was your last menstrual period?"       No.  Protocols used: Dizziness - Lightheadedness-A-AH

## 2023-01-18 NOTE — Telephone Encounter (Signed)
Copied from CRM [id number]. Topic: Clinical - Medication Refill >> Jan 18, 2023 12:55 PM Gaetano Hawthorne wrote: Most Recent Primary Care Visit:  Provider: [facility], [place]  Department: [facility] MED  Visit Type: OFFICE VISIT  Date: 01/01/2023  Medication: Xanax  Has the patient contacted their pharmacy? Yes,  (Agent: If no, request that the patient contact the pharmacy for the refill. If patient does not wish to contact the pharmacy document the reason why and proceed with request.) (Agent: If yes, when and what did the pharmacy advise?)  Is this the correct pharmacy for this prescription? Yes If no, delete pharmacy and type the correct one.  This is the patient's preferred pharmacy:  [facility] [place], [place] - [address] [place] [address] Phone: [phone] Fax: [phone]   Has the prescription been filled recently? No  Is the patient out of the medication? Yes  Has the patient been seen for an appointment in the last year OR does the patient have an upcoming appointment? Yes  Can we respond through MyChart? Yes  Agent: Please be advised that Rx refills may take up to 3 business days. We ask that you follow-up with your pharmacy.

## 2023-01-19 ENCOUNTER — Encounter: Payer: Self-pay | Admitting: Family Medicine

## 2023-01-19 ENCOUNTER — Other Ambulatory Visit: Payer: Self-pay | Admitting: Family

## 2023-01-19 ENCOUNTER — Ambulatory Visit: Payer: Medicare Other

## 2023-01-19 ENCOUNTER — Other Ambulatory Visit: Payer: Medicare Other

## 2023-01-19 ENCOUNTER — Ambulatory Visit (INDEPENDENT_AMBULATORY_CARE_PROVIDER_SITE_OTHER): Payer: Medicare Other | Admitting: Family Medicine

## 2023-01-19 ENCOUNTER — Other Ambulatory Visit: Payer: Self-pay

## 2023-01-19 VITALS — BP 133/78 | HR 81 | Temp 96.5°F | Ht 63.0 in | Wt 147.6 lb

## 2023-01-19 DIAGNOSIS — J069 Acute upper respiratory infection, unspecified: Secondary | ICD-10-CM

## 2023-01-19 DIAGNOSIS — R42 Dizziness and giddiness: Secondary | ICD-10-CM

## 2023-01-19 DIAGNOSIS — R6889 Other general symptoms and signs: Secondary | ICD-10-CM | POA: Diagnosis not present

## 2023-01-19 DIAGNOSIS — F411 Generalized anxiety disorder: Secondary | ICD-10-CM

## 2023-01-19 MED ORDER — ALPRAZOLAM 0.25 MG PO TABS: 0.2500 mg | ORAL_TABLET | Freq: Two times a day (BID) | ORAL | 1 refills | Status: DC | PRN

## 2023-01-19 NOTE — Progress Notes (Signed)
Subjective:  Patient ID: Amy Black, female    DOB: 15-Feb-1940, 82 y.o.   MRN: [id number]  Patient Care Team: Junie Spencer, FNP as PCP - General (Family Medicine) Rollene Rotunda, MD as PCP - Cardiology (Cardiology) Kellie Shropshire, PT (Inactive) as Physical Therapist (Physical Therapy) Erenest Blank, NP as Nurse Practitioner (Nurse Practitioner) Sherrilyn Rist, MD as Consulting Physician (Gastroenterology) Samson Frederic, MD as Consulting Physician (Orthopedic Surgery) Josph Macho, MD as Consulting Physician (Oncology)   Chief Complaint:  Dizziness (X 1 week)   HPI: Amy Black is a 82 y.o. female presenting on 01/19/2023 for Dizziness (X 1 week)   Discussed the use of AI scribe software for clinical note transcription with the patient, who gave verbal consent to proceed.  History of Present Illness   Miss [name], a patient with a history of hypertension, presents with a chief complaint of feeling "rough" and experiencing intermittent lightheadedness. The patient reports that these episodes of lightheadedness occur sporadically, either while standing or sitting, and have been ongoing since her last visit. The patient denies any current dizziness but notes that the symptom comes and goes, with the last episode occurring the day prior to the current visit.  In addition to the lightheadedness, the patient also reports the onset of a runny nose, although it is unclear whether this is due to a cold or sinus issues. The patient denies any current chest pain, shortness of breath, or fluid retention.  The patient also mentions concerns about her blood pressure, which she perceives to be elevated, although it is noted to be within her goal range. The patient's blood pressure appears to fluctuate, and she expresses a desire for an EKG, citing that she has not had one since her last doctor's visit.  Lastly, the patient requests a refill of her Xanax prescription,  indicating that her previous 60-pill prescription has been exhausted. The patient has attempted to request a refill earlier in the week and has been advised to schedule a video consultation, which she was unable to do due to technological limitations.          Relevant past medical, surgical, family, and social history reviewed and updated as indicated.  Allergies and medications reviewed and updated. Data reviewed: Chart in Epic.   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 minimal CAD by cath in 2016 with presumed spasm (20% mLAD, 20% D2).   Cataract    bilateral cataracts removed   Coronary artery spasm (HCC)    Diverticulosis of colon (without mention of hemorrhage)    Femoral neck fracture, right, closed, initial encounter 10/04/2015   GERD (gastroesophageal reflux disease)    hepatic cyst    History of nuclear stress test 04/2011   lexiscan; normal study, no significant ischemia, low risk; 2015 - normal   HTN (hypertension)    PT. DENIES   Hyperlipidemia    Insomnia    Irritable bowel syndrome    Transverse myelitis (HCC)    Vitamin D deficiency     Past Surgical History:  Procedure Laterality Date   ABDOMINAL HYSTERECTOMY Bilateral    CARDIAC CATHETERIZATION     coronary spasm   CARDIAC CATHETERIZATION N/A 08/28/2014   Procedure: Left Heart Cath  and Coronary Angiography;  Surgeon: Corky Crafts, MD;  Location: [facility] INVASIVE CV LAB;  Service: Cardiovascular;  Laterality: N/A;   COLONOSCOPY     DIAGNOSTIC LAPAROSCOPY     LYSIS OF ADHESION     PELVIC LAPAROSCOPY  1992   RSO, AND LSO ON 2007   RESECTION SOFT TISSUE TUMOR LEG / ANKLE RADICAL  2004   leg lesion resection & radiation   SALPINGOOPHORECTOMY Left    TOTAL HIP ARTHROPLASTY Right 10/04/2015   Procedure: TOTAL HIP ARTHROPLASTY ANTERIOR APPROACH;   Surgeon: Samson Frederic, MD;  Location: [facility] OR;  Service: Orthopedics;  Laterality: Right;   TRANSTHORACIC ECHOCARDIOGRAM  07/2012   LV cavity size mildly reduced, normal wall motion; MV with calcified annulus and mild MR; LA mildly dilated; atrial septum with increased thickness - lipomatous hypertrophy; RV systolic pressure increased (borderline pulm HTN)   UPPER GASTROINTESTINAL ENDOSCOPY      Social History   Socioeconomic History   Marital status: Married    Spouse name: Juel Burrow   Number of children: 2   Years of education: 12   Highest education level: High school graduate  Occupational History   Occupation: Disabilty   Occupation: DISABILITY    Employer: UNEMPLOYED  Tobacco Use   Smoking status: Former    Current packs/day: 0.00    Average packs/day: 0.5 packs/day for 4.0 years (2.0 ttl pk-yrs)    Types: Cigarettes    Start date: 12/07/1968    Quit date: 11/20/1972    Years since quitting: 50.1   Smokeless tobacco: Never   Tobacco comments:    quit 40 years ago  Vaping Use   Vaping status: Never Used  Substance and Sexual Activity   Alcohol use: No    Alcohol/week: 0.0 standard drinks of alcohol   Drug use: No   Sexual activity: Not Currently    Birth control/protection: Surgical  Other Topics Concern   Not on file  Social History Narrative   Disabled, lives with husband and daughter.  Enjoys shopping. Lives in two story home   1 son and 1 daughter.   1 grandson.   Caffeine 2 cans of soda and tea   Right handed   Social Drivers of Health   Financial Resource Strain: Low Risk  (03/22/2022)   Overall Financial Resource Strain (CARDIA)    Difficulty of Paying Living Expenses: Not hard at all  Food Insecurity: No Food Insecurity (03/22/2022)   Hunger Vital Sign    Worried About Running Out of Food in the Last Year: Never true    Ran Out of Food in the Last Year: Never true  Transportation Needs: No Transportation Needs (03/22/2022)   PRAPARE - Doctor, general practice (Medical): No    Lack of Transportation (Non-Medical): No  Physical Activity: Inactive (03/22/2022)   Exercise Vital Sign    Days of Exercise per Week: 0 days    Minutes of Exercise per Session: 0 min  Stress: No Stress Concern Present (03/22/2022)   [employer] of Occupational Health - Occupational Stress Questionnaire    Feeling of Stress : Not at all  Social Connections: Moderately Integrated (03/22/2022)   Social Connection and Isolation Panel [NHANES]    Frequency of Communication with Friends and Family: More than three times a week    Frequency of Social Gatherings with Friends and Family: More than three times a week    Attends Religious Services: More than 4 times per year    Active  Member of Clubs or Organizations: No    Attends Banker Meetings: Never    Marital Status: Married  Catering manager Violence: Not At Risk (03/22/2022)   Humiliation, Afraid, Rape, and Kick questionnaire    Fear of Current or Ex-Partner: No    Emotionally Abused: No    Physically Abused: No    Sexually Abused: No    Outpatient Encounter Medications as of 01/19/2023  Medication Sig   ALPRAZolam (XANAX) 0.25 MG tablet Take 1 tablet (0.25 mg total) by mouth 2 (two) times daily as needed for anxiety.   baclofen (LIORESAL) 10 MG tablet Take 0.5-1 tablets (5-10 mg total) by mouth 2 (two) times daily as needed for muscle spasms (to REPLACE the flexeril).   cetirizine (ZYRTEC) 10 MG tablet Take 1 tablet (10 mg total) by mouth daily.   diphenhydrAMINE (BENADRYL) 50 MG tablet Take 1 tablet by mouth one hour prior to IV contrast   fluticasone (FLONASE) 50 MCG/ACT nasal spray Place 2 sprays into both nostrils daily.   hydrOXYzine (ATARAX) 10 MG tablet TAKE 1 TABLET BY MOUTH TWICE DAILY.   Hyoscyamine Sulfate SL (LEVSIN/SL) 0.125 MG SUBL Place 0.125 mg under the tongue every 4 (four) hours as needed (abd cramping and diarrhea).   isosorbide mononitrate (IMDUR) 120 MG 24 hr  tablet Take 1 tablet (120 mg total) by mouth daily.   nitroGLYCERIN (NITROSTAT) 0.4 MG SL tablet DISSOLVE ONE TABLET UNDER TONGUE EVERY 5 MINUTES UP TO 3 DOSES AS NEEDED FOR CHEST PAIN   omeprazole (PRILOSEC) 20 MG capsule Take 1 capsule (20 mg total) by mouth daily.   traMADol (ULTRAM) 50 MG tablet TAKE 1 TABLET EVERY 12 HOURS AS NEEDED   triamterene-hydrochlorothiazide (DYAZIDE) 37.5-25 MG capsule TAKE ONE CAPSULE BY MOUTH 3 TIMES PER WEEK   verapamil (CALAN-SR) 180 MG CR tablet Take 1 tablet (180 mg total) by mouth at bedtime.   mirtazapine (REMERON) 7.5 MG tablet Take 1 tablet (7.5 mg total) by mouth at bedtime. (Patient not taking: Reported on 01/19/2023)   No facility-administered encounter medications on file as of 01/19/2023.    Allergies  Allergen Reactions   Iodine Itching   Ivp Dye [Iodinated Contrast Media] Itching   Amlodipine Other (See Comments)    headaches   Lexapro [Escitalopram Oxalate] Other (See Comments)    Drunk, High feeling   Cymbalta [Duloxetine Hcl] Other (See Comments)    sleepiness   Duloxetine Other (See Comments)    Other reaction(s): Unknown    sleepiness   Gabapentin Other (See Comments)    sleepiness   Tizanidine Other (See Comments)    Other reaction(s): Unknown    Very drunk feeling next day   Zanaflex [Tizanidine Hcl] Other (See Comments)    Very drunk feeling next day    Pertinent ROS per HPI, otherwise unremarkable      Objective:  BP 133/78   Pulse 81   Temp (!) 96.5 F (35.8 C)   Ht 5\' 3"  (1.6 m)   Wt 147 lb 9.6 oz (67 kg)   SpO2 97%   BMI 26.15 kg/m    Wt Readings from Last 3 Encounters:  01/19/23 147 lb 9.6 oz (67 kg)  01/01/23 150 lb 3.2 oz (68.1 kg)  12/15/22 152 lb (68.9 kg)    Physical Exam Vitals and nursing note reviewed.  Constitutional:      General: She is not in acute distress.    Appearance: Normal appearance. She is well-developed and well-groomed. She is not  ill-appearing, toxic-appearing or  diaphoretic.  HENT:     Head: Normocephalic and atraumatic.     Jaw: There is normal jaw occlusion.     Right Ear: Hearing, tympanic membrane, ear canal and external ear normal.     Left Ear: Hearing, tympanic membrane, ear canal and external ear normal.     Nose: Congestion present.     Mouth/Throat:     Lips: Pink.     Mouth: Mucous membranes are moist.     Pharynx: Oropharynx is clear. Uvula midline. Postnasal drip present. No pharyngeal swelling, oropharyngeal exudate, posterior oropharyngeal erythema or uvula swelling.  Eyes:     General: Lids are normal.     Extraocular Movements: Extraocular movements intact.     Conjunctiva/sclera: Conjunctivae normal.     Pupils: Pupils are equal, round, and reactive to light.  Neck:     Thyroid: No thyroid mass, thyromegaly or thyroid tenderness.     Vascular: No carotid bruit or JVD.     Trachea: Trachea and phonation normal.  Cardiovascular:     Rate and Rhythm: Normal rate and regular rhythm.     Chest Wall: PMI is not displaced.     Pulses: Normal pulses.     Heart sounds: Normal heart sounds. No murmur heard.    No friction rub. No gallop.  Pulmonary:     Effort: Pulmonary effort is normal. No respiratory distress.     Breath sounds: Normal breath sounds. No wheezing.  Abdominal:     General: Bowel sounds are normal. There is no distension or abdominal bruit.     Palpations: Abdomen is soft. There is no hepatomegaly or splenomegaly.     Tenderness: There is no abdominal tenderness. There is no right CVA tenderness or left CVA tenderness.     Hernia: No hernia is present.  Musculoskeletal:        General: Normal range of motion.     Cervical back: Normal range of motion and neck supple.     Right lower leg: No edema.     Left lower leg: No edema.  Lymphadenopathy:     Cervical: No cervical adenopathy.  Skin:    General: Skin is warm and dry.     Capillary Refill: Capillary refill takes less than 2 seconds.     Coloration:  Skin is not cyanotic, jaundiced or pale.     Findings: No rash.  Neurological:     General: No focal deficit present.     Mental Status: She is alert and oriented to person, place, and time.     Cranial Nerves: No cranial nerve deficit.     Sensory: Sensation is intact. No sensory deficit.     Motor: Motor function is intact. No weakness.     Coordination: Coordination is intact. Coordination normal.     Gait: Gait is intact. Gait normal.     Deep Tendon Reflexes: Reflexes are normal and symmetric. Reflexes normal.  Psychiatric:        Attention and Perception: Attention and perception normal.        Mood and Affect: Mood and affect normal.        Speech: Speech normal.        Behavior: Behavior normal. Behavior is cooperative.        Thought Content: Thought content normal.        Cognition and Memory: Cognition and memory normal.        Judgment: Judgment normal.    Physical  Exam   VITALS: BP- 140/90 HEENT: Ears normal. Throat minor drainage.        Results for orders placed or performed in visit on 01/01/23  CMP14+EGFR   Collection Time: 01/01/23  2:28 PM  Result Value Ref Range   Glucose 71 70 - 99 mg/dL   BUN 10 8 - 27 mg/dL   Creatinine, Ser 8.65 0.57 - 1.00 mg/dL   eGFR 59 (L) >78 IO/NGE/9.52   BUN/Creatinine Ratio 10 (L) 12 - 28   Sodium 137 134 - 144 mmol/L   Potassium 4.8 3.5 - 5.2 mmol/L   Chloride 100 96 - 106 mmol/L   CO2 25 20 - 29 mmol/L   Calcium 9.7 8.7 - 10.3 mg/dL   Total Protein 7.0 6.0 - 8.5 g/dL   Albumin 4.4 3.8 - 4.8 g/dL   Globulin, Total 2.6 1.5 - 4.5 g/dL   Bilirubin Total 0.3 0.0 - 1.2 mg/dL   Alkaline Phosphatase 117 44 - 121 IU/L   AST 18 0 - 40 IU/L   ALT 14 0 - 32 IU/L   *Note: Due to a large number of results and/or encounters for the requested time period, some results have not been displayed. A complete set of results can be found in Results Review.     EKG: SR 79, PR 178 ms, QT 382 ms, no acute ST-T changes, no significant changes  from previous EKG.  Pertinent labs & imaging results that were available during my care of the patient were reviewed by me and considered in my medical decision making.  Assessment & Plan:  Sallyjo was seen today for dizziness.  Diagnoses and all orders for this visit:  Dizzy spells -     CMP14+EGFR -     CBC with Differential/Platelet -     Thyroid Panel With TSH -     LONG TERM MONITOR (3-14 DAYS); Future -     EKG 12-Lead  URI with cough and congestion -     COVID-19, Flu A+B and RSV     Assessment and Plan    Upper Respiratory Infection Presents with cough, congestion, and rhinorrhea. Physical exam shows minor throat drainage, no significant ear findings. Differential includes viral URI and COVID-19. - Perform COVID-19 swab - Advise rest and hydration  Intermittent Dizziness Reports intermittent dizziness while standing or sitting, onset since last visit. No dizziness during visit. Previous low blood count in October. Differential includes anemia, electrolyte imbalance, and cardiac arrhythmia. Discussed need for blood work, Zio heart monitor for two weeks, and EKG. - Check blood count - Recheck sodium levels and renal function - Place on Zio heart monitor for two weeks - Perform EKG - Follow up with primary care provider in 2-3 weeks  Hypertension Blood pressure at goal (<140/90). Reports fluctuations but no acute symptoms. Discussed importance of regular monitoring and maintaining current management. - Continue current hypertension management - Monitor blood pressure regularly  General Health Maintenance Inquires about Xanax refill, last filled on November 8th for 60 pills. Explained controlled substances must be managed by primary care provider. - Message primary care provider to request prescription refill - Advise follow-up with primary care provider for controlled substance prescriptions  Follow-up - Keep scheduled appointment with primary care provider on  January 7th - Keep scheduled appointment with cardiologist on March 6th.          Continue all other maintenance medications.  Follow up plan: Return if symptoms worsen or fail to improve, for PCP as  scheduled.   Continue healthy lifestyle choices, including diet (rich in fruits, vegetables, and lean proteins, and low in salt and simple carbohydrates) and exercise (at least 30 minutes of moderate physical activity daily).  Educational handout given for URI, dizziness  The above assessment and management plan was discussed with the patient. The patient verbalized understanding of and has agreed to the management plan. Patient is aware to call the clinic if they develop any new symptoms or if symptoms persist or worsen. Patient is aware when to return to the clinic for a follow-up visit. Patient educated on when it is appropriate to go to the emergency department.   Kari Baars, FNP-C [facility] [phone]

## 2023-01-19 NOTE — Telephone Encounter (Signed)
Patient has an appointment scheduled today with Kari Baars for this problem

## 2023-01-19 NOTE — Progress Notes (Signed)
Xanax Prescription sent to pharmacy

## 2023-01-20 LAB — CBC WITH DIFFERENTIAL/PLATELET
Basophils Absolute: 0.1 10*3/uL (ref 0.0–0.2)
Basos: 1 %
EOS (ABSOLUTE): 0.1 10*3/uL (ref 0.0–0.4)
Eos: 1 %
Hematocrit: 35 % (ref 34.0–46.6)
Hemoglobin: 11.6 g/dL (ref 11.1–15.9)
Immature Grans (Abs): 0 10*3/uL (ref 0.0–0.1)
Immature Granulocytes: 0 %
Lymphocytes Absolute: 1.6 10*3/uL (ref 0.7–3.1)
Lymphs: 28 %
MCH: 31.1 pg (ref 26.6–33.0)
MCHC: 33.1 g/dL (ref 31.5–35.7)
MCV: 94 fL (ref 79–97)
Monocytes Absolute: 0.6 10*3/uL (ref 0.1–0.9)
Monocytes: 10 %
Neutrophils Absolute: 3.5 10*3/uL (ref 1.4–7.0)
Neutrophils: 60 %
Platelets: 455 10*3/uL — ABNORMAL HIGH (ref 150–450)
RBC: 3.73 x10E6/uL — ABNORMAL LOW (ref 3.77–5.28)
RDW: 13.1 % (ref 11.7–15.4)
WBC: 5.8 10*3/uL (ref 3.4–10.8)

## 2023-01-20 LAB — THYROID PANEL WITH TSH
Free Thyroxine Index: 2 (ref 1.2–4.9)
T3 Uptake Ratio: 26 % (ref 24–39)
T4, Total: 7.8 ug/dL (ref 4.5–12.0)
TSH: 0.496 u[IU]/mL (ref 0.450–4.500)

## 2023-01-20 LAB — CMP14+EGFR
ALT: 13 IU/L (ref 0–32)
AST: 21 IU/L (ref 0–40)
Albumin: 4.4 g/dL (ref 3.8–4.8)
Alkaline Phosphatase: 114 IU/L (ref 44–121)
BUN/Creatinine Ratio: 17 (ref 12–28)
BUN: 15 mg/dL (ref 8–27)
Bilirubin Total: 0.4 mg/dL (ref 0.0–1.2)
CO2: 25 mmol/L (ref 20–29)
Calcium: 9.9 mg/dL (ref 8.7–10.3)
Chloride: 97 mmol/L (ref 96–106)
Creatinine, Ser: 0.89 mg/dL (ref 0.57–1.00)
Globulin, Total: 3 g/dL (ref 1.5–4.5)
Glucose: 81 mg/dL (ref 70–99)
Potassium: 4.1 mmol/L (ref 3.5–5.2)
Sodium: 136 mmol/L (ref 134–144)
Total Protein: 7.4 g/dL (ref 6.0–8.5)
eGFR: 65 mL/min/{1.73_m2} (ref >59)

## 2023-02-09 ENCOUNTER — Telehealth: Payer: Self-pay | Admitting: Family

## 2023-02-09 DIAGNOSIS — Z0279 Encounter for issue of other medical certificate: Secondary | ICD-10-CM

## 2023-02-13 ENCOUNTER — Encounter: Payer: Self-pay | Admitting: Family

## 2023-02-13 ENCOUNTER — Telehealth: Payer: Medicare Other | Admitting: Family

## 2023-02-13 DIAGNOSIS — G373 Acute transverse myelitis in demyelinating disease of central nervous system: Secondary | ICD-10-CM

## 2023-02-13 DIAGNOSIS — K219 Gastro-esophageal reflux disease without esophagitis: Secondary | ICD-10-CM

## 2023-02-13 DIAGNOSIS — F5101 Primary insomnia: Secondary | ICD-10-CM

## 2023-02-13 DIAGNOSIS — I10 Essential (primary) hypertension: Secondary | ICD-10-CM | POA: Diagnosis not present

## 2023-02-13 DIAGNOSIS — F411 Generalized anxiety disorder: Secondary | ICD-10-CM

## 2023-02-13 DIAGNOSIS — D508 Other iron deficiency anemias: Secondary | ICD-10-CM

## 2023-02-13 MED ORDER — MIRTAZAPINE 7.5 MG PO TABS: ORAL_TABLET | ORAL | 0 refills | Status: DC

## 2023-02-13 MED ORDER — OMEPRAZOLE 20 MG PO CPDR: 20.0000 mg | DELAYED_RELEASE_CAPSULE | Freq: Every day | ORAL | 1 refills | Status: DC

## 2023-02-13 MED ORDER — TRAMADOL HCL 50 MG PO TABS: ORAL_TABLET | ORAL | 1 refills | Status: DC

## 2023-02-13 MED ORDER — HYDROXYZINE HCL 10 MG PO TABS: 10.0000 mg | ORAL_TABLET | Freq: Two times a day (BID) | ORAL | 0 refills | Status: AC

## 2023-02-13 MED ORDER — ALPRAZOLAM 0.25 MG PO TABS: 0.2500 mg | ORAL_TABLET | Freq: Two times a day (BID) | ORAL | 1 refills | Status: DC | PRN

## 2023-02-13 MED ORDER — MIRTAZAPINE 15 MG PO TABS: 15.0000 mg | ORAL_TABLET | Freq: Every day | ORAL | 1 refills | Status: DC

## 2023-02-13 NOTE — Progress Notes (Signed)
 Virtual Visit Consent   Amy Black, you are scheduled for a virtual visit with a [facility] provider today. Just as with appointments in the office, your consent must be obtained to participate. Your consent will be active for this visit and any virtual visit you may have with one of our providers in the next 365 days. If you have a MyChart account, a copy of this consent can be sent to you electronically.  As this is a virtual visit, video technology does not allow for your provider to perform a traditional examination. This may limit your provider's ability to fully assess your condition. If your provider identifies any concerns that need to be evaluated in person or the need to arrange testing (such as labs, EKG, etc.), we will make arrangements to do so. Although advances in technology are sophisticated, we cannot ensure that it will always work on either your end or our end. If the connection with a video visit is poor, the visit may have to be switched to a telephone visit. With either a video or telephone visit, we are not always able to ensure that we have a secure connection.  By engaging in this virtual visit, you consent to the provision of healthcare and authorize for your insurance to be billed (if applicable) for the services provided during this visit. Depending on your insurance coverage, you may receive a charge related to this service.  I need to obtain your verbal consent now. Are you willing to proceed with your visit today? Amy Black has provided verbal consent on 02/13/2023 for a virtual visit (video or telephone). Bari Learn, FNP  Date: 02/13/2023 2:54 PM  Virtual Visit via Video Note   I, Bari Learn, connected with  Amy Black  ([id number], 1941-01-05) on 02/13/23 at  2:25 PM EST by a video-enabled telemedicine application and verified that I am speaking with the correct person using two identifiers.  Location: Patient: Virtual Visit Location Patient:  Home Provider: Virtual Visit Location Provider: Home Office   I discussed the limitations of evaluation and management by telemedicine and the availability of in person appointments. The patient expressed understanding and agreed to proceed.    History of Present Illness: Amy Black is a 83 y.o. who identifies as a female who was assigned female at birth, and is being seen today for chronic follow up.    She is followed by Cardiologists every 6 months for CAD and HTN.   She is followed by Oncologists/hematologists for iron  anemia and hx adenocarcinoma.    She is followed by GI as needed for IBS.    She is also complaining of anxiety. She has been on xanax  since 1982 and wants to stop this medication. We have tampered her down to Xanax  0.25 mg TID and added Remeron  7.5 mg. Reports she is sleeping better.Amy Black  HPI: Anxiety Presents for follow-up visit. Symptoms include excessive worry, nervous/anxious behavior, panic and restlessness. Symptoms occur occasionally. The severity of symptoms is mild.   Her past medical history is significant for anemia.  Hypertension This is a chronic problem. The current episode started more than 1 year ago. The problem has been resolved since onset. The problem is controlled. Associated symptoms include anxiety and malaise/fatigue. The current treatment provides mild improvement.  Gastroesophageal Reflux She complains of belching and heartburn. This is a chronic problem. The current episode started more than 1 year ago. The problem occurs occasionally. She has tried a PPI for the symptoms.  The treatment provided moderate relief.  Anemia Presents for follow-up visit. Symptoms include malaise/fatigue.  Back Pain This is a chronic problem. The current episode started more than 1 year ago. The problem occurs intermittently. The problem has been waxing and waning since onset. The pain is present in the lumbar spine. The quality of the pain is described as  aching. The pain is at a severity of 8/10. The symptoms are aggravated by standing and bending. She has tried analgesics and bed rest for the symptoms. The treatment provided mild relief.    Problems:  Patient Active Problem List   Diagnosis Date Noted   GAD (generalized anxiety disorder) 02/13/2023   Dizzy spells 01/01/2023   Seasonal allergic rhinitis 10/02/2022   Vitamin D  deficiency 11/11/2021   Fecal smearing 02/14/2021   Other headache syndrome 07/03/2020   Herpes zoster dermatitis 02/16/2020   IDA (iron  deficiency anemia) 05/07/2019   Palpitations 11/14/2018   Thrombocytosis 07/23/2015   Baker's cyst of knee 02/14/2013   Coronary artery spasm (HCC) 11/19/2012   Transverse myelitis (HCC) 10/10/2012   HTN (hypertension) 08/29/2012   Arthritis of neck 08/29/2012   Urinary retention 02/20/2012   Lower extremity weakness 02/20/2012   Adenocarcinoma of unknown primary (HCC) 02/06/2011   Coronary atherosclerosis 12/03/2009   GERD 12/03/2009    Allergies:  Allergies  Allergen Reactions   Iodine Itching   Ivp Dye [Iodinated Contrast Media] Itching   Amlodipine  Other (See Comments)    headaches   Lexapro  [Escitalopram  Oxalate] Other (See Comments)    Drunk, High feeling   Cymbalta  [Duloxetine  Hcl] Other (See Comments)    sleepiness   Duloxetine  Other (See Comments)    Other reaction(s): Unknown    sleepiness   Gabapentin  Other (See Comments)    sleepiness   Tizanidine  Other (See Comments)    Other reaction(s): Unknown    Very drunk feeling next day   Zanaflex  [Tizanidine  Hcl] Other (See Comments)    Very drunk feeling next day   Medications:  Current Outpatient Medications:    [START ON 03/15/2023] mirtazapine  (REMERON ) 15 MG tablet, Take 1 tablet (15 mg total) by mouth at bedtime., Disp: 90 tablet, Rfl: 1   ALPRAZolam  (XANAX ) 0.25 MG tablet, Take 1 tablet (0.25 mg total) by mouth 2 (two) times daily as needed for anxiety., Disp: 60 tablet, Rfl: 1   cetirizine   (ZYRTEC ) 10 MG tablet, Take 1 tablet (10 mg total) by mouth daily., Disp: 30 tablet, Rfl: 11   hydrOXYzine  (ATARAX ) 10 MG tablet, Take 1 tablet (10 mg total) by mouth 2 (two) times daily., Disp: 60 tablet, Rfl: 0   Hyoscyamine  Sulfate SL (LEVSIN /SL) 0.125 MG SUBL, Place 0.125 mg under the tongue every 4 (four) hours as needed (abd cramping and diarrhea)., Disp: 120 tablet, Rfl: 5   isosorbide  mononitrate (IMDUR ) 120 MG 24 hr tablet, Take 1 tablet (120 mg total) by mouth daily., Disp: 90 tablet, Rfl: 3   mirtazapine  (REMERON ) 7.5 MG tablet, Take 1 tablet (7.5 mg total) by mouth at bedtime for 14 days, THEN 2 tablets (15 mg total) at bedtime for 14 days., Disp: 42 tablet, Rfl: 0   nitroGLYCERIN  (NITROSTAT ) 0.4 MG SL tablet, DISSOLVE ONE TABLET UNDER TONGUE EVERY 5 MINUTES UP TO 3 DOSES AS NEEDED FOR CHEST PAIN, Disp: 25 tablet, Rfl: 11   omeprazole  (PRILOSEC) 20 MG capsule, Take 1 capsule (20 mg total) by mouth daily., Disp: 90 capsule, Rfl: 1   traMADol  (ULTRAM ) 50 MG tablet, TAKE 1 TABLET EVERY  12 HOURS AS NEEDED, Disp: 60 tablet, Rfl: 1   triamterene -hydrochlorothiazide  (DYAZIDE ) 37.5-25 MG capsule, TAKE ONE CAPSULE BY MOUTH 3 TIMES PER WEEK, Disp: 90 capsule, Rfl: 3   verapamil  (CALAN -SR) 180 MG CR tablet, Take 1 tablet (180 mg total) by mouth at bedtime., Disp: 90 tablet, Rfl: 3  Observations/Objective: Patient is well-developed, well-nourished in no acute distress.  Resting comfortably  at home.  Head is normocephalic, atraumatic.  No labored breathing.  Speech is clear and coherent with logical content.  Patient is alert and oriented at baseline.    Assessment and Plan: 1. Gastroesophageal reflux disease without esophagitis (Primary) - omeprazole  (PRILOSEC) 20 MG capsule; Take 1 capsule (20 mg total) by mouth daily.  Dispense: 90 capsule; Refill: 1  2. Primary hypertension  3. Other iron  deficiency anemia  4. GAD (generalized anxiety disorder) - ALPRAZolam  (XANAX ) 0.25 MG tablet;  Take 1 tablet (0.25 mg total) by mouth 2 (two) times daily as needed for anxiety.  Dispense: 60 tablet; Refill: 1 - mirtazapine  (REMERON ) 7.5 MG tablet; Take 1 tablet (7.5 mg total) by mouth at bedtime for 14 days, THEN 2 tablets (15 mg total) at bedtime for 14 days.  Dispense: 42 tablet; Refill: 0 - mirtazapine  (REMERON ) 15 MG tablet; Take 1 tablet (15 mg total) by mouth at bedtime.  Dispense: 90 tablet; Refill: 1  5. Transverse myelitis (HCC) - traMADol  (ULTRAM ) 50 MG tablet; TAKE 1 TABLET EVERY 12 HOURS AS NEEDED  Dispense: 60 tablet; Refill: 1  6. Primary insomnia - mirtazapine  (REMERON ) 7.5 MG tablet; Take 1 tablet (7.5 mg total) by mouth at bedtime for 14 days, THEN 2 tablets (15 mg total) at bedtime for 14 days.  Dispense: 42 tablet; Refill: 0 - mirtazapine  (REMERON ) 15 MG tablet; Take 1 tablet (15 mg total) by mouth at bedtime.  Dispense: 90 tablet; Refill: 1  Continue current medications  Will restart remeron  7.5 mg then increase to 15 mg after two weeks  Stress management  Patient reviewed in [place] controlled database, no flags noted. Contract and drug screen are up to date.  Follow up in 3 months   Follow Up Instructions: I discussed the assessment and treatment plan with the patient. The patient was provided an opportunity to ask questions and all were answered. The patient agreed with the plan and demonstrated an understanding of the instructions.  A copy of instructions were sent to the patient via MyChart unless otherwise noted below.     The patient was advised to call back or seek an in-person evaluation if the symptoms worsen or if the condition fails to improve as anticipated.    Bari Learn, FNP

## 2023-02-13 NOTE — Patient Instructions (Signed)

## 2023-02-16 DIAGNOSIS — R42 Dizziness and giddiness: Secondary | ICD-10-CM | POA: Diagnosis not present

## 2023-02-20 ENCOUNTER — Other Ambulatory Visit: Payer: Self-pay | Admitting: Family Medicine

## 2023-02-20 DIAGNOSIS — I498 Other specified cardiac arrhythmias: Secondary | ICD-10-CM

## 2023-02-20 NOTE — Telephone Encounter (Signed)
 PCP completed and signed FMLA forms. They have been faxed to [employer] at fax number [phone]. Patient has been contacted and informed they are complete. Copy at front desk for daughter.

## 2023-02-22 ENCOUNTER — Encounter: Payer: Self-pay | Admitting: Podiatry

## 2023-02-22 ENCOUNTER — Ambulatory Visit: Payer: Medicare Other | Admitting: Podiatry

## 2023-02-22 DIAGNOSIS — L84 Corns and callosities: Secondary | ICD-10-CM | POA: Diagnosis not present

## 2023-02-22 NOTE — Progress Notes (Signed)
This patient presents to the office with chief complaint of callus on her left big toe.  She says this callus is painful walking and wearing his shoes.  She presents for evaluation and treatment.  General Appearance  Alert, conversant and in no acute stress.  Vascular  Dorsalis pedis and posterior tibial  pulses are palpable  bilaterally.  Capillary return is within normal limits  bilaterally. Temperature is within normal limits  bilaterally.  Neurologic  Senn-Weinstein monofilament wire test within normal limits  bilaterally. Muscle power within normal limits bilaterally.  Nails Thick disfigured discolored nails with subungual debris  from hallux to fifth toes bilaterally. No evidence of bacterial infection or drainage bilaterally.  Orthopedic  No limitations of motion  feet .  No crepitus or effusions noted. HAV 1st MPJ left foot.  Skin  normotropic skin with no porokeratosis noted bilaterally.  No signs of infections or ulcers noted.   Pinch callus left hallux. Callus sub 4th met left foot asymptomatic.  Callus left foot  Debride callus with # 15 blade and dremel tool.  Patient says she injured her right foot and carries a CD of the xray.  Told her to see Dr.  Allena Katz with the CD for evaluation of right foot.   Helane Gunther DPM

## 2023-02-27 DIAGNOSIS — H524 Presbyopia: Secondary | ICD-10-CM | POA: Diagnosis not present

## 2023-02-27 DIAGNOSIS — Z961 Presence of intraocular lens: Secondary | ICD-10-CM | POA: Diagnosis not present

## 2023-02-27 DIAGNOSIS — H43813 Vitreous degeneration, bilateral: Secondary | ICD-10-CM | POA: Diagnosis not present

## 2023-03-08 ENCOUNTER — Inpatient Hospital Stay: Payer: Medicare Other | Attending: Hematology & Oncology

## 2023-03-08 ENCOUNTER — Encounter: Payer: Self-pay | Admitting: Medical Oncology

## 2023-03-08 ENCOUNTER — Ambulatory Visit (HOSPITAL_BASED_OUTPATIENT_CLINIC_OR_DEPARTMENT_OTHER)
Admission: RE | Admit: 2023-03-08 | Discharge: 2023-03-08 | Disposition: A | Discharge status: Home / Self Care | Payer: Medicare Other | Source: Ambulatory Visit | Attending: Medical Oncology | Admitting: Medical Oncology

## 2023-03-08 ENCOUNTER — Inpatient Hospital Stay: Payer: Medicare Other | Admitting: Medical Oncology

## 2023-03-08 VITALS — BP 154/74 | HR 95 | Temp 98.1°F | Resp 18 | Ht 63.0 in | Wt 149.0 lb

## 2023-03-08 DIAGNOSIS — R634 Abnormal weight loss: Secondary | ICD-10-CM

## 2023-03-08 DIAGNOSIS — R5383 Other fatigue: Secondary | ICD-10-CM

## 2023-03-08 DIAGNOSIS — G373 Acute transverse myelitis in demyelinating disease of central nervous system: Secondary | ICD-10-CM

## 2023-03-08 DIAGNOSIS — R61 Generalized hyperhidrosis: Secondary | ICD-10-CM | POA: Insufficient documentation

## 2023-03-08 DIAGNOSIS — D649 Anemia, unspecified: Secondary | ICD-10-CM

## 2023-03-08 DIAGNOSIS — C801 Malignant (primary) neoplasm, unspecified: Secondary | ICD-10-CM | POA: Insufficient documentation

## 2023-03-08 DIAGNOSIS — M549 Dorsalgia, unspecified: Secondary | ICD-10-CM | POA: Insufficient documentation

## 2023-03-08 DIAGNOSIS — D75839 Thrombocytosis, unspecified: Secondary | ICD-10-CM

## 2023-03-08 DIAGNOSIS — K5730 Diverticulosis of large intestine without perforation or abscess without bleeding: Secondary | ICD-10-CM | POA: Diagnosis not present

## 2023-03-08 DIAGNOSIS — R911 Solitary pulmonary nodule: Secondary | ICD-10-CM | POA: Diagnosis not present

## 2023-03-08 DIAGNOSIS — K7689 Other specified diseases of liver: Secondary | ICD-10-CM | POA: Diagnosis not present

## 2023-03-08 DIAGNOSIS — D500 Iron deficiency anemia secondary to blood loss (chronic): Secondary | ICD-10-CM

## 2023-03-08 LAB — CMP (CANCER CENTER ONLY)
ALT: 9 U/L (ref 0–44)
AST: 14 U/L — ABNORMAL LOW (ref 15–41)
Albumin: 4.6 g/dL (ref 3.5–5.0)
Alkaline Phosphatase: 78 U/L (ref 38–126)
Anion gap: 10 (ref 5–15)
BUN: 19 mg/dL (ref 8–23)
CO2: 25 mmol/L (ref 22–32)
Calcium: 10 mg/dL (ref 8.9–10.3)
Chloride: 99 mmol/L (ref 98–111)
Creatinine: 0.87 mg/dL (ref 0.44–1.00)
GFR, Estimated: >60 mL/min (ref >60)
Glucose, Bld: 155 mg/dL — ABNORMAL HIGH (ref 70–99)
Potassium: 4 mmol/L (ref 3.5–5.1)
Sodium: 134 mmol/L — ABNORMAL LOW (ref 135–145)
Total Bilirubin: 0.3 mg/dL (ref 0.0–1.2)
Total Protein: 7.9 g/dL (ref 6.5–8.1)

## 2023-03-08 LAB — CBC WITH DIFFERENTIAL (CANCER CENTER ONLY)
Abs Immature Granulocytes: 0.02 10*3/uL (ref 0.00–0.07)
Basophils Absolute: 0 10*3/uL (ref 0.0–0.1)
Basophils Relative: 0 %
Eosinophils Absolute: 0 10*3/uL (ref 0.0–0.5)
Eosinophils Relative: 0 %
HCT: 34.4 % — ABNORMAL LOW (ref 36.0–46.0)
Hemoglobin: 11.3 g/dL — ABNORMAL LOW (ref 12.0–15.0)
Immature Granulocytes: 0 %
Lymphocytes Relative: 14 %
Lymphs Abs: 0.9 10*3/uL (ref 0.7–4.0)
MCH: 31 pg (ref 26.0–34.0)
MCHC: 32.8 g/dL (ref 30.0–36.0)
MCV: 94.5 fL (ref 80.0–100.0)
Monocytes Absolute: 0.1 10*3/uL (ref 0.1–1.0)
Monocytes Relative: 1 %
Neutro Abs: 5.5 10*3/uL (ref 1.7–7.7)
Neutrophils Relative %: 85 %
Platelet Count: 420 10*3/uL — ABNORMAL HIGH (ref 150–400)
RBC: 3.64 MIL/uL — ABNORMAL LOW (ref 3.87–5.11)
RDW: 13.2 % (ref 11.5–15.5)
WBC Count: 6.5 10*3/uL (ref 4.0–10.5)
nRBC: 0 % (ref 0.0–0.2)

## 2023-03-08 LAB — VITAMIN B12: Vitamin B-12: 299 pg/mL (ref 180–914)

## 2023-03-08 LAB — IRON AND IRON BINDING CAPACITY (CC-WL,HP ONLY)
Iron: 56 ug/dL (ref 28–170)
Saturation Ratios: 21 % (ref 10.4–31.8)
TIBC: 269 ug/dL (ref 250–450)
UIBC: 213 ug/dL (ref 148–442)

## 2023-03-08 LAB — FERRITIN: Ferritin: 174 ng/mL (ref 11–307)

## 2023-03-08 LAB — FOLATE: Folate: 15.2 ng/mL (ref >5.9)

## 2023-03-08 LAB — LACTATE DEHYDROGENASE: LDH: 132 U/L (ref 98–192)

## 2023-03-08 MED ORDER — IOHEXOL 300 MG/ML  SOLN
100.0000 mL | Freq: Once | INTRAMUSCULAR | Status: AC | PRN
  Administered 2023-03-08: 100 mL via INTRAVENOUS

## 2023-03-08 NOTE — Progress Notes (Unsigned)
Hematology and Oncology Follow Up Visit  Amy Black [id number] 1940-10-19 83 y.o. 03/08/2023   Principle Diagnosis:  Transient thrombocytosis History transverse myelitis History of adenosquamous carcinoma of unknown primary Iron def anemia   Current Therapy:        Fusion Plus -- daily   Interim History:  Amy Black is here today with her son for follow-up  At our last visit on 08/17/2022 we discussed CT imaging given her symptoms of abdominal pain, unintentional weight loss, etc. This was ordered but has not been performed. She also was going to discuss her fatigue and dizziness with her cardiologist which she has done.  She reports that she took her premeds I gave her at her last visit for a cardiac scan she had done with contrast. She tolerated this well and didn't have any trouble with the IV contrast or premeds.   Today she states that she has been fatigued with occasional dizziness and has had nausea for the past month. Overall feeling worse than at her last visit.   No falls or syncope reported.  No swelling, tenderness, numbness or tingling in her extremities.  No fever, chills, n/v, cough, rash, dizziness, SOB, chest pain, palpitations, abdominal pain or changes in bowel or bladder habits.  She does have night sweats  Appetite and hydration are good.   Wt Readings from Last 3 Encounters:  03/08/23 149 lb (67.6 kg)  01/19/23 147 lb 9.6 oz (67 kg)  01/01/23 150 lb 3.2 oz (68.1 kg)   ECOG Performance Status: 1 - Symptomatic but completely ambulatory  Medications:  Allergies as of 03/08/2023       Reactions   Iodine Itching   Ivp Dye [iodinated Contrast Media] Itching   Amlodipine Other (See Comments)   headaches   Lexapro [escitalopram Oxalate] Other (See Comments)   Drunk, High feeling   Tizanidine Other (See Comments)   Other reaction(s):Drunk feeling the next day       Cymbalta [duloxetine Hcl] Other (See Comments)   sleepiness   Duloxetine Other (See  Comments)   Other reaction(s): sleepy   Gabapentin Other (See Comments)   sleepiness   Zanaflex [tizanidine Hcl] Other (See Comments)   Very drunk feeling next day        Medication List        Accurate as of March 08, 2023  2:09 PM. If you have any questions, ask your nurse or doctor.          ALPRAZolam 0.25 MG tablet Commonly known as: XANAX Take 1 tablet (0.25 mg total) by mouth 2 (two) times daily as needed for anxiety.   cetirizine 10 MG tablet Commonly known as: ZYRTEC Take 1 tablet (10 mg total) by mouth daily.   hydrOXYzine 10 MG tablet Commonly known as: ATARAX Take 1 tablet (10 mg total) by mouth 2 (two) times daily.   Hyoscyamine Sulfate SL 0.125 MG Subl Commonly known as: Levsin/SL Place 0.125 mg under the tongue every 4 (four) hours as needed (abd cramping and diarrhea).   isosorbide mononitrate 120 MG 24 hr tablet Commonly known as: IMDUR Take 1 tablet (120 mg total) by mouth daily.   mirtazapine 7.5 MG tablet Commonly known as: REMERON Take 1 tablet (7.5 mg total) by mouth at bedtime for 14 days, THEN 2 tablets (15 mg total) at bedtime for 14 days. Start taking on: February 13, 2023   mirtazapine 15 MG tablet Commonly known as: REMERON Take 1 tablet (15 mg total) by mouth  at bedtime. Start taking on: March 15, 2023   nitroGLYCERIN 0.4 MG SL tablet Commonly known as: NITROSTAT DISSOLVE ONE TABLET UNDER TONGUE EVERY 5 MINUTES UP TO 3 DOSES AS NEEDED FOR CHEST PAIN   omeprazole 20 MG capsule Commonly known as: PRILOSEC Take 1 capsule (20 mg total) by mouth daily.   traMADol 50 MG tablet Commonly known as: ULTRAM TAKE 1 TABLET EVERY 12 HOURS AS NEEDED   triamterene-hydrochlorothiazide 37.5-25 MG capsule Commonly known as: DYAZIDE TAKE ONE CAPSULE BY MOUTH 3 TIMES PER WEEK   verapamil 180 MG CR tablet Commonly known as: CALAN-SR Take 1 tablet (180 mg total) by mouth at bedtime.        Allergies:  Allergies  Allergen Reactions    Iodine Itching   Ivp Dye [Iodinated Contrast Media] Itching   Amlodipine Other (See Comments)    headaches   Lexapro [Escitalopram Oxalate] Other (See Comments)    Drunk, High feeling   Tizanidine Other (See Comments)    Other reaction(s):Drunk feeling the next day       Cymbalta [Duloxetine Hcl] Other (See Comments)    sleepiness   Duloxetine Other (See Comments)    Other reaction(s): sleepy   Gabapentin Other (See Comments)    sleepiness   Zanaflex [Tizanidine Hcl] Other (See Comments)    Very drunk feeling next day    Past Medical History, Surgical history, Social history, and Family History were reviewed and updated.  Review of Systems: All other 10 point review of systems is negative.   Physical Exam:  height is 5\' 3"  (1.6 m) and weight is 149 lb (67.6 kg). Her oral temperature is 98.1 F (36.7 C). Her blood pressure is 154/74 (abnormal) and her pulse is 95. Her respiration is 18 and oxygen saturation is 100%.   Wt Readings from Last 3 Encounters:  03/08/23 149 lb (67.6 kg)  01/19/23 147 lb 9.6 oz (67 kg)  01/01/23 150 lb 3.2 oz (68.1 kg)   Constitutional: AA X 3 Ocular: Sclerae unicteric, pupils equal, round and reactive to light Ear-nose-throat: Oropharynx clear, dentition fair Lymphatic: No cervical or supraclavicular adenopathy Lungs no rales or rhonchi, good excursion bilaterally Heart regular rate and rhythm, no murmur appreciated Abd soft MSK no focal spinal tenderness, no joint edema Neuro: non-focal, well-oriented, appropriate affect Skin: No rash  Lab Results  Component Value Date   WBC 6.5 03/08/2023   HGB 11.3 (L) 03/08/2023   HCT 34.4 (L) 03/08/2023   MCV 94.5 03/08/2023   PLT 420 (H) 03/08/2023   Lab Results  Component Value Date   FERRITIN 167 12/04/2022   IRON 70 12/04/2022   TIBC 260 12/04/2022   UIBC 190 12/04/2022   IRONPCTSAT 27 12/04/2022   Lab Results  Component Value Date   RETICCTPCT 1.8 12/04/2022   RBC 3.64 (L) 03/08/2023    No results found for: "KPAFRELGTCHN", "LAMBDASER", "KAPLAMBRATIO" No results found for: "IGGSERUM", "IGA", "IGMSERUM" No results found for: "TOTALPROTELP", "ALBUMINELP", "A1GS", "A2GS", "BETS", "BETA2SER", "GAMS", "MSPIKE", "SPEI"   Chemistry      Component Value Date/Time   NA 134 (L) 03/08/2023 1308   NA 136 01/19/2023 1618   NA 138 01/28/2016 1303   K 4.0 03/08/2023 1308   K 4.2 07/28/2016 1243   K 4.2 01/28/2016 1303   CL 99 03/08/2023 1308   CL 98 07/28/2016 1243   CL 101 07/20/2014 1131   CO2 25 03/08/2023 1308   CO2 29 07/28/2016 1243   CO2 30 (H) 01/28/2016  1303   BUN 19 03/08/2023 1308   BUN 15 01/19/2023 1618   BUN 10.0 01/28/2016 1303   CREATININE 0.87 03/08/2023 1308   CREATININE 0.94 07/28/2016 1243   CREATININE 0.8 01/28/2016 1303      Component Value Date/Time   CALCIUM 10.0 03/08/2023 1308   CALCIUM 9.9 07/28/2016 1243   CALCIUM 10.0 01/28/2016 1303   ALKPHOS 78 03/08/2023 1308   ALKPHOS 105 07/28/2016 1243   ALKPHOS 123 01/28/2016 1303   AST 14 (L) 03/08/2023 1308   AST 13 01/28/2016 1303   ALT 9 03/08/2023 1308   ALT 12 01/28/2016 1303   BILITOT 0.3 03/08/2023 1308   BILITOT 0.51 01/28/2016 1303     No diagnosis found.  Impression and Plan: Ms. Braddock is a very pleasant 83yo African American female with history of transient thrombocytosis and also history of adenosquamous carcinoma with unknown primary.   She still has not had the CT discussed and recommended.  CBC shows a Hgb of 11.3, platelet count of 420. Iron studies and B12/folate pending.   RTC 3 months APP, labs (CBC w/, CMP, iron, ferritin, B12, folate, LDH)-[place]   Rushie Chestnut, PA-C 1/30/20252:09 PM

## 2023-03-09 ENCOUNTER — Ambulatory Visit (INDEPENDENT_AMBULATORY_CARE_PROVIDER_SITE_OTHER): Payer: Medicare Other | Admitting: Podiatry

## 2023-03-09 DIAGNOSIS — Z9181 History of falling: Secondary | ICD-10-CM

## 2023-03-09 DIAGNOSIS — M79671 Pain in right foot: Secondary | ICD-10-CM

## 2023-03-09 DIAGNOSIS — R252 Cramp and spasm: Secondary | ICD-10-CM | POA: Diagnosis not present

## 2023-03-09 DIAGNOSIS — M722 Plantar fascial fibromatosis: Secondary | ICD-10-CM | POA: Diagnosis not present

## 2023-03-09 NOTE — Progress Notes (Unsigned)
Subjective:  Patient ID: Amy Black, female    DOB: 1940/04/16,  MRN: [id number]  Chief Complaint  Patient presents with   Foot Pain    Right top of foot and toes spasms and cramp and painful. Has been happening for a year. She did fall in Oct. And she has disc with xrays on it. Not diabetic and no anticoags.     83 y.o. female presents with the above complaint.  Patient presents for follow-up of right foot cramps.  She just wants to get evaluated.  Flexeril does not help denies any other acute complaints   Review of Systems: Negative except as noted in the HPI. Denies N/V/F/Ch.  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 minimal CAD by cath in 2016 with presumed spasm (20% mLAD, 20% D2).   Cataract    bilateral cataracts removed   Coronary artery spasm (HCC)    Diverticulosis of colon (without mention of hemorrhage)    Femoral neck fracture, right, closed, initial encounter 10/04/2015   GERD (gastroesophageal reflux disease)    hepatic cyst    History of nuclear stress test 04/2011   lexiscan; normal study, no significant ischemia, low risk; 2015 - normal   HTN (hypertension)    PT. DENIES   Hyperlipidemia    Insomnia    Irritable bowel syndrome    Transverse myelitis (HCC)    Vitamin D deficiency     Current Outpatient Medications:    ALPRAZolam (XANAX) 0.25 MG tablet, Take 1 tablet (0.25 mg total) by mouth 2 (two) times daily as needed for anxiety., Disp: 60 tablet, Rfl: 1   cetirizine (ZYRTEC) 10 MG tablet, Take 1 tablet (10 mg total) by mouth daily., Disp: 30 tablet, Rfl: 11   hydrOXYzine (ATARAX) 10 MG tablet, Take 1 tablet (10 mg total) by mouth 2 (two) times daily., Disp: 60 tablet, Rfl: 0   Hyoscyamine Sulfate SL (LEVSIN/SL) 0.125 MG SUBL, Place 0.125 mg under the tongue every 4 (four) hours as needed  (abd cramping and diarrhea)., Disp: 120 tablet, Rfl: 5   isosorbide mononitrate (IMDUR) 120 MG 24 hr tablet, Take 1 tablet (120 mg total) by mouth daily., Disp: 90 tablet, Rfl: 3   [START ON 03/15/2023] mirtazapine (REMERON) 15 MG tablet, Take 1 tablet (15 mg total) by mouth at bedtime., Disp: 90 tablet, Rfl: 1   mirtazapine (REMERON) 7.5 MG tablet, Take 1 tablet (7.5 mg total) by mouth at bedtime for 14 days, THEN 2 tablets (15 mg total) at bedtime for 14 days., Disp: 42 tablet, Rfl: 0   nitroGLYCERIN (NITROSTAT) 0.4 MG SL tablet, DISSOLVE ONE TABLET UNDER TONGUE EVERY 5 MINUTES UP TO 3 DOSES AS NEEDED FOR CHEST PAIN, Disp: 25 tablet, Rfl: 11   omeprazole (PRILOSEC) 20 MG capsule, Take 1 capsule (20 mg total) by mouth daily., Disp: 90 capsule, Rfl: 1   traMADol (ULTRAM) 50 MG tablet, TAKE 1 TABLET EVERY 12 HOURS AS NEEDED, Disp: 60 tablet, Rfl: 1   triamterene-hydrochlorothiazide (DYAZIDE) 37.5-25 MG capsule, TAKE ONE CAPSULE BY MOUTH 3 TIMES PER WEEK, Disp: 90 capsule, Rfl: 3   verapamil (CALAN-SR) 180 MG CR tablet, Take 1 tablet (180 mg total) by mouth at bedtime., Disp: 90 tablet, Rfl: 3  Social History   Tobacco Use  Smoking Status Former   Current packs/day: 0.00   Average packs/day: 0.5 packs/day for 4.0 years (2.0 ttl pk-yrs)   Types: Cigarettes   Start date: 12/07/1968   Quit date: 11/20/1972   Years since quitting: 50.3  Smokeless Tobacco Never  Tobacco Comments   quit 40 years ago    Allergies  Allergen Reactions   Iodine Itching   Ivp Dye [Iodinated Contrast Media] Itching   Amlodipine Other (See Comments)    headaches   Lexapro [Escitalopram Oxalate] Other (See Comments)    Drunk, High feeling   Tizanidine Other (See Comments)    Other reaction(s):Drunk feeling the next day       Cymbalta [Duloxetine Hcl] Other (See Comments)    sleepiness   Duloxetine Other (See Comments)    Other reaction(s): sleepy   Gabapentin Other (See Comments)    sleepiness   Zanaflex  [Tizanidine Hcl] Other (See Comments)    Very drunk feeling next day   Objective:  There were no vitals filed for this visit. There is no height or weight on file to calculate BMI. Constitutional Well developed. Well nourished.  Vascular Dorsalis pedis pulses palpable bilaterally. Posterior tibial pulses palpable bilaterally. Capillary refill normal to all digits.  No cyanosis or clubbing noted. Pedal hair growth normal.  Neurologic Normal speech. Oriented to person, place, and time. Epicritic sensation to light touch grossly present bilaterally.  Dermatologic Nails well groomed and normal in appearance. No open wounds. No skin lesions.  Orthopedic: Unable to appreciate cramping right now.  Patient has good strength to all of her toes with good extensor and flexor tendinitis.  Hammertoe contractures noted that could likely be an attributing to cramping due to small muscles firing at the wrong time to cramp of the toes.  Negative charley horses symptoms.  Negative claudication symptoms.   Radiographs: None Assessment:   1. Foot cramps     Plan:  Patient was evaluated and treated and all questions answered.  Right foot cramping cramping with a history of peripheral vascular disease -All questions and concerns were discussed with the patient in extensive detail.  She has palpable pulses to both of her foot ruling out peripheral vascular disease.  I believe this may just be small foot tendinitis versus large leg tendons imbalance.  I discussed this with the patient she states understanding.  -She will benefit from night splint as Flexeril has not helped in the past.  No follow-ups on file.

## 2023-03-15 ENCOUNTER — Encounter: Payer: Self-pay | Admitting: Family

## 2023-03-15 ENCOUNTER — Telehealth: Payer: Self-pay

## 2023-03-15 NOTE — Telephone Encounter (Signed)
-----   Message from Sharla Davis sent at 03/14/2023  4:09 PM EST ----- Her B12 level is low. I would like for her to take B12 1,000 mg once daily. This is available over the counter

## 2023-03-15 NOTE — Telephone Encounter (Signed)
 This RN called patient to inform her of her B12 results being low per Lauraine Dais, PA.  Pt aware that Lauraine Dais PA recommends that she take 1000 mg daily of vitamin B12 orally.  Pt aware she can get this over the counter. Pt verbalized understanding and had no further questions.

## 2023-03-22 ENCOUNTER — Telehealth: Payer: Self-pay

## 2023-03-22 NOTE — Telephone Encounter (Signed)
-----   Message from Rushie Chestnut sent at 03/22/2023  2:13 PM EST ----- Please let her know that her CT looked good. Her bone pain does not appear to be related. She does have some coronary artery diease which is likely age related. She should make sure to have follow up with her primary care for continued management

## 2023-03-22 NOTE — Telephone Encounter (Signed)
Per Clent Jacks, PA pt advised of the following "her CT looked good. Her bone pain does not appear to be related. She does have some coronary artery diease which is likely age related. She should make sure to have follow up with her primary care for continued management " Pt stated she has a PCP appointment in March.  Pt appreciative of call and had no further questions.

## 2023-03-30 ENCOUNTER — Telehealth: Payer: Self-pay | Admitting: Cardiology

## 2023-03-30 NOTE — Telephone Encounter (Signed)
Called patient and patient denies sob, denies chest pain or numbness in left arm at this time. Request earlier appointment because episode are becoming to regular per patient. Reports at least having these symptoms twice a week , but  reports when taking Nitro X 2  as ordered and then patient reports she get relief by going to bed. Appointment made with App on 2/28 @2 :45. Advise patient to go to Emergency Room for chest pain, sob, dizziness or  if symptoms return and are worse. Patient verbalized an understanding.

## 2023-03-30 NOTE — Telephone Encounter (Signed)
Pt c/o of Chest Pain: STAT if CP now or developed within 24 hours  1. Are you having CP right now? No   2. Are you experiencing any other symptoms (ex. SOB, nausea, vomiting, sweating)? No   3. How long have you been experiencing CP? Since last week   4. Is your CP continuous or coming and going? Coming and going   5. Have you taken Nitroglycerin? Yes - yesterday   Dull pain in chest and numbness going down left arm. Not currently having these issues, but requesting sooner appt with Dr. Antoine Poche.  ?

## 2023-04-06 ENCOUNTER — Encounter: Payer: Self-pay | Admitting: Physician Assistant

## 2023-04-06 ENCOUNTER — Ambulatory Visit: Payer: Medicare Other | Attending: Physician Assistant | Admitting: Physician Assistant

## 2023-04-06 ENCOUNTER — Other Ambulatory Visit: Payer: Self-pay | Admitting: Physician Assistant

## 2023-04-06 VITALS — BP 146/78 | HR 81 | Ht 63.0 in | Wt 149.0 lb

## 2023-04-06 DIAGNOSIS — I25111 Atherosclerotic heart disease of native coronary artery with angina pectoris with documented spasm: Secondary | ICD-10-CM

## 2023-04-06 DIAGNOSIS — R079 Chest pain, unspecified: Secondary | ICD-10-CM | POA: Diagnosis not present

## 2023-04-06 MED ORDER — METOPROLOL TARTRATE 25 MG PO TABS: 25.0000 mg | ORAL_TABLET | Freq: Two times a day (BID) | ORAL | 3 refills | Status: AC

## 2023-04-06 MED ORDER — DIPHENHYDRAMINE HCL 50 MG PO TABS: 50.0000 mg | ORAL_TABLET | Freq: Once | ORAL | Status: DC

## 2023-04-06 MED ORDER — PREDNISONE 50 MG PO TABS: ORAL_TABLET | ORAL | 0 refills | Status: DC

## 2023-04-06 NOTE — Progress Notes (Signed)
 Cardiology Office Note:  .   Date:  04/06/2023  ID:  Freda Jaquith Sherlin, DOB 09/18/1940, MRN [id number] PCP: Junie Spencer, FNP  [facility] Providers Cardiologist:  Rollene Rotunda, MD     History of Present Illness: .   Montoya Watkin Malachi is a 83 y.o. female with PMH of minimal CAD, coronary artery spasm, palpitations/PSVT, hypertension, chronic fatigue, iron deficiency anemia, adenosquamous carcinoma, transverse myelitis, IBS, anxiety and GERD.  Cardiac catheterization in July 2016 showed essentially normal coronary arteries other than 20% disease in the LAD.  Her chest pain was presumed to be coronary vasospasm and she was treated with Imdur.  She experienced improvement in her symptom with transition from verapamil to diltiazem.  Heart monitor in May 2022 showed minimal heart rate of 53, maximal heart rate of 169, average heart rate 80 bpm.  30 episode of SVT with fastest interval lasting 5 beats with a maximal heart rate of 169, longest lasting 6 beats with average heart rate of 130 bpm, less than 1% PVC burden.  The SVT episodes did not correlate with any symptoms.  She is followed by hematology/oncology service for history of iron deficiency anemia and adenosquamous carcinoma.  Myoview in July 2024 showed EF 55 to 65%, normal perfusion, no ischemia or infarction.  Patient was last seen by Bernadene Person NP on 09/26/2022.   Patient presents today for follow-up.  She has been having intermittent chest discomfort for the past few weeks.  Blood pressure is mildly elevated, I recommended start on metoprolol tartrate 25 mg twice a day.  I will proceed with coronary CT to rule out significant coronary artery disease.  In the morning of the coronary CT, patient has been instructed to take a 50 mg dose of metoprolol tartrate.  It we will give the patient 50 mg of Benadryl and 3 doses of 50 mg prednisone taken every 6 hours prior to the coronary CT.  If coronary CT looks good, patient can follow-up in  38-month.  ROS:   Patient complains of intermittent chest discomfort for the past few weeks, she denies any shortness of breath, lower extremity edema, orthopnea or PND.  Studies Reviewed: .        Cardiac Studies & Procedures   ______________________________________________________________________________________________ CARDIAC CATHETERIZATION  CARDIAC CATHETERIZATION 08/28/2014  Narrative  The left ventricular systolic function is normal.  Minimal CAD.  Continue preventive therapy.  F/u with Dr. Antoine Poche.  Findings Coronary Findings Diagnostic  Dominance: Right  Left Anterior Descending  First Diagonal Branch The vessel is small in size.  Second Diagonal Branch  Third Diagonal Branch The vessel is small in size.  Ramus Intermedius The vessel is small .  Intervention  No interventions have been documented.   STRESS TESTS  MYOCARDIAL PERFUSION IMAGING 08/23/2022  Narrative   The study is normal. The study is low risk.   No ST deviation was noted.   Left ventricular function is normal. Nuclear stress EF: 63%. The left ventricular ejection fraction is normal (55-65%). End diastolic cavity size is normal. End systolic cavity size is normal.  Normal resting and stress perfusion. No ischemia or infarction EF 63%      MONITORS  LONG TERM MONITOR (3-14 DAYS) 02/16/2023  Narrative Tachy-Brady syndrome, referral to cardiology.   Patch Wear Time:  13 days and 23 hours (2024-12-13T16:08:30-0500 to 2024-12-27T16:08:22-0500)  Patient had a min HR of 49 bpm, max HR of 188 bpm, and avg HR of 76 bpm. Predominant underlying rhythm was Sinus  Rhythm. First Degree AV Block was present. Slight P wave morphology changes were noted. 109 Supraventricular Tachycardia runs occurred, the run with the fastest interval lasting 4 beats with a max rate of 188 bpm, the longest lasting 13.1 secs with an avg rate of 108 bpm. Some episodes of Supraventricular Tachycardia may be  possible Atrial Tachycardia with variable block. Second Degree AV Block-Mobitz I (Wenckebach) was present. Isolated SVEs were occasional (4.1%, 62145), SVE Couplets were rare (<1.0%, 3887), and SVE Triplets were rare (<1.0%, 497). Isolated VEs were rare (<1.0%), VE Couplets were rare (<1.0%), and no VE Triplets were present.       ______________________________________________________________________________________________      Risk Assessment/Calculations:            Physical Exam:   VS:  BP (!) 146/78 (BP Location: Left Arm, Patient Position: Sitting)   Pulse 81   Ht 5\' 3"  (1.6 m)   Wt 149 lb (67.6 kg)   SpO2 98%   BMI 26.39 kg/m    Wt Readings from Last 3 Encounters:  04/06/23 149 lb (67.6 kg)  03/08/23 149 lb (67.6 kg)  01/19/23 147 lb 9.6 oz (67 kg)    GEN: Well nourished, well developed in no acute distress NECK: No JVD; No carotid bruits CARDIAC: RRR, no murmurs, rubs, gallops RESPIRATORY:  Clear to auscultation without rales, wheezing or rhonchi  ABDOMEN: Soft, non-tender, non-distended EXTREMITIES:  No edema; No deformity   ASSESSMENT AND PLAN: .     Chest Pain New onset of chest pain in the past two weeks, not associated with exertion. EKG showed sinus rhythm with no obvious changes suggestive of new blockage. -Prior history of coronary spasm and minimal CAD -Negative Myoview last year, however chest pain is more recent -Start Metoprolol Tartrate 25mg  twice daily to decrease episodes of chest pain. -Order Coronary CT for definitive evaluation of potential coronary artery disease. -Will need premedication due to contrast allergy.  Benadryl in the morning of the coronary CT.  Will order 3 doses of prednisone 50 mg taken every 6 hours prior to the coronary CT.  50 mg of metoprolol tartrate in the morning of the coronary CT.  Hypertension Mildly elevated blood pressure. -Start Metoprolol Tartrate 25mg  twice daily.        Dispo: Follow-up in 6 months if the  coronary CT came back negative  Signed, Azalee Course, [place]

## 2023-04-06 NOTE — Patient Instructions (Signed)
 Medication Instructions:  START METOPROLOL TARTRATE 25 MG TWICE DAILY *If you need a refill on your cardiac medications before your next appointment, please call your pharmacy*   Lab Work: NO LABS If you have labs (blood work) drawn today and your tests are completely normal, you will receive your results only by: MyChart Message (if you have MyChart) OR A paper copy in the mail If you have any lab test that is abnormal or we need to change your treatment, we will call you to review the results.   Testing/Procedures: Your physician has requested that you have cardiac CT. Cardiac computed tomography (CT) is a painless test that uses an x-ray machine to take clear, detailed pictures of your heart. For further information please visit [email or web address] Please follow instruction sheet as given.     Follow-Up: At [facility], you and your health needs are our priority.  As part of our continuing mission to provide you with exceptional heart care, we have created designated Provider Care Teams.  These Care Teams include your primary Cardiologist (physician) and Advanced Practice Providers (APPs -  Physician Assistants and Nurse Practitioners) who all work together to provide you with the care you need, when you need it.  We recommend signing up for the patient portal called "MyChart".  Sign up information is provided on this After Visit Summary.  MyChart is used to connect with patients for Virtual Visits (Telemedicine).  Patients are able to view lab/test results, encounter notes, upcoming appointments, etc.  Non-urgent messages can be sent to your provider as well.   To learn more about what you can do with MyChart, go to [email or web address].    Your next appointment:   6 month(s)  Provider:   Rollene Rotunda, MD   Other Instructions   Your cardiac CT will be scheduled at one of the below locations:   [facility] [address] [place], [place]  [address] [phone]   If scheduled at [facility], please arrive at the [facility] (Entrance C2) of [facility] 30 minutes prior to test start time. You can use the FREE valet parking offered at entrance C (encouraged to control the heart rate for the test)  Proceed to the [facility] Radiology Department (first floor) to check-in and test prep.  All radiology patients and guests should use entrance C2 at [facility], accessed from [facility], even though the hospital's physical address listed is [address].    Please follow these instructions carefully (unless otherwise directed):  An IV will be required for this test and Nitroglycerin will be given.  Hold all erectile dysfunction medications at least 3 days (72 hrs) prior to test. (Ie viagra, cialis, sildenafil, tadalafil, etc)   On the Night Before the Test: Be sure to Drink plenty of water. Do not consume any caffeinated/decaffeinated beverages or chocolate 12 hours prior to your test. Do not take any antihistamines 12 hours prior to your test. If the patient has contrast allergy: Patient will need a prescription for Prednisone and very clear instructions (as follows): TAKE PREDNISONE 50 MG EVERY 6 HOURS LEADING UP TO CT. Take Benadryl 50 mg 1 hour prior to test Patient must complete all four doses of above prophylactic medications. Patient will need a ride after test due to Benadryl.  On the Day of the Test: Drink plenty of water until 1 hour prior to the test. Do not eat any food 1 hour prior  to test. You may take your regular medications prior to the test.  Take metoprolol (Lopressor) two hours prior to test. If you take Furosemide/Hydrochlorothiazide/Spironolactone/Chlorthalidone, please HOLD on the morning of the test. Patients who wear a continuous glucose monitor MUST remove the device prior to scanning. FEMALES- please wear underwire-free bra if  available, avoid dresses & tight clothing  TAKE 2 TABLETS OF METOPROLOL TARTRATE 2 HOURS PRIOR TO PROCEDURE      After the Test: Drink plenty of water. After receiving IV contrast, you may experience a mild flushed feeling. This is normal. On occasion, you may experience a mild rash up to 24 hours after the test. This is not dangerous. If this occurs, you can take Benadryl 25 mg, Zyrtec, Claritin, or Allegra and increase your fluid intake. (Patients taking Tikosyn should avoid Benadryl, and may take Zyrtec, Claritin, or Allegra) If you experience trouble breathing, this can be serious. If it is severe call 911 IMMEDIATELY. If it is mild, please call our office.  We will call to schedule your test 2-4 weeks out understanding that some insurance companies will need an authorization prior to the service being performed.   For more information and frequently asked questions, please visit our website : [email or web address]  For non-scheduling related questions, please contact the cardiac imaging nurse navigator should you have any questions/concerns: Cardiac Imaging Nurse Navigators Direct Office Dial: [phone]   For scheduling needs, including cancellations and rescheduling, please call [place], [phone].

## 2023-04-11 ENCOUNTER — Other Ambulatory Visit: Payer: Self-pay | Admitting: Family

## 2023-04-11 DIAGNOSIS — F411 Generalized anxiety disorder: Secondary | ICD-10-CM

## 2023-04-11 NOTE — Telephone Encounter (Signed)
 Copied from CRM [id number]. Topic: Clinical - Medication Question >> Apr 11, 2023  4:26 PM Archie Patten S wrote: Reason for CRM: Patient states that she is unable to take the sleeping pill prescribed for her. She wants her prescription for ALPRAZolam (XANAX) 0.25 MG tablet to be refilled for a 90 pills with her taking it 3 times a day instead of 2.

## 2023-04-11 NOTE — Telephone Encounter (Signed)
 Copied from CRM [id number]. Topic: Clinical - Medication Refill >> Apr 11, 2023  4:24 PM Gildardo Pounds wrote: Most Recent Primary Care Visit:  Provider: Jannifer Rodney A  Department: [facility]  Visit Type: MYCHART VIDEO VISIT  Date: 02/13/2023  Medication: ALPRAZolam (XANAX) 0.25 MG tablet  Has the patient contacted their pharmacy? Yes (Agent: If no, request that the patient contact the pharmacy for the refill. If patient does not wish to contact the pharmacy document the reason why and proceed with request.) (Agent: If yes, when and what did the pharmacy advise?)  Is this the correct pharmacy for this prescription? Yes If no, delete pharmacy and type the correct one.  This is the patient's preferred pharmacy:  [facility] [place], [place] - [address] [place] [address] Phone: [phone] Fax: [phone]  Has the prescription been filled recently? No  Is the patient out of the medication? No  Has the patient been seen for an appointment in the last year OR does the patient have an upcoming appointment? Yes  Can we respond through MyChart? No  Agent: Please be advised that Rx refills may take up to 3 business days. We ask that you follow-up with your pharmacy.

## 2023-04-12 ENCOUNTER — Ambulatory Visit: Payer: Medicare Other | Admitting: Cardiology

## 2023-04-16 ENCOUNTER — Telehealth (HOSPITAL_COMMUNITY): Payer: Self-pay | Admitting: *Deleted

## 2023-04-16 NOTE — Telephone Encounter (Signed)
 Reaching out to patient to offer assistance regarding upcoming cardiac imaging study; pt verbalizes understanding of appt date/time, parking situation and where to check in, pre-test NPO status and medications ordered, and verified current allergies; name and call back number provided for further questions should they arise  Larey Brick RN Navigator Cardiac Imaging [facility] [phone] office [phone] cell  Reviewed with patient how to take 13 hour prep and metoprolol. Patient to take medications at 10:30 PM, 4:30 AM, 9:30 AM, and 10:30 AM. She is aware to arrive at 11AM.

## 2023-04-17 ENCOUNTER — Ambulatory Visit (HOSPITAL_COMMUNITY)
Admission: RE | Admit: 2023-04-17 | Discharge: 2023-04-17 | Disposition: A | Discharge status: Home / Self Care | Source: Ambulatory Visit | Attending: Physician Assistant | Admitting: Physician Assistant

## 2023-04-17 DIAGNOSIS — I700 Atherosclerosis of aorta: Secondary | ICD-10-CM | POA: Diagnosis not present

## 2023-04-17 DIAGNOSIS — I2510 Atherosclerotic heart disease of native coronary artery without angina pectoris: Secondary | ICD-10-CM | POA: Diagnosis not present

## 2023-04-17 DIAGNOSIS — R079 Chest pain, unspecified: Secondary | ICD-10-CM | POA: Diagnosis not present

## 2023-04-17 MED ORDER — IOHEXOL 350 MG/ML SOLN
100.0000 mL | Freq: Once | INTRAVENOUS | Status: AC | PRN
  Administered 2023-04-17: 100 mL via INTRAVENOUS

## 2023-04-17 MED ORDER — NITROGLYCERIN 0.4 MG SL SUBL
0.8000 mg | SUBLINGUAL_TABLET | Freq: Once | SUBLINGUAL | Status: AC
  Administered 2023-04-17: 0.8 mg via SUBLINGUAL

## 2023-04-17 MED ORDER — NITROGLYCERIN 0.4 MG SL SUBL
SUBLINGUAL_TABLET | SUBLINGUAL | Status: AC
  Filled 2023-04-17: qty 2

## 2023-04-24 ENCOUNTER — Ambulatory Visit (HOSPITAL_COMMUNITY)

## 2023-05-15 ENCOUNTER — Encounter: Payer: Self-pay | Admitting: Family

## 2023-05-15 ENCOUNTER — Other Ambulatory Visit: Payer: Self-pay | Admitting: Family Medicine

## 2023-05-15 ENCOUNTER — Ambulatory Visit (INDEPENDENT_AMBULATORY_CARE_PROVIDER_SITE_OTHER): Payer: Medicare Other | Admitting: Family

## 2023-05-15 VITALS — BP 125/79 | HR 80 | Temp 98.0°F | Ht 63.0 in | Wt 147.0 lb

## 2023-05-15 DIAGNOSIS — I25119 Atherosclerotic heart disease of native coronary artery with unspecified angina pectoris: Secondary | ICD-10-CM

## 2023-05-15 DIAGNOSIS — Z0001 Encounter for general adult medical examination with abnormal findings: Secondary | ICD-10-CM

## 2023-05-15 DIAGNOSIS — J209 Acute bronchitis, unspecified: Secondary | ICD-10-CM | POA: Diagnosis not present

## 2023-05-15 DIAGNOSIS — I10 Essential (primary) hypertension: Secondary | ICD-10-CM

## 2023-05-15 DIAGNOSIS — Z79891 Long term (current) use of opiate analgesic: Secondary | ICD-10-CM | POA: Diagnosis not present

## 2023-05-15 DIAGNOSIS — E559 Vitamin D deficiency, unspecified: Secondary | ICD-10-CM | POA: Diagnosis not present

## 2023-05-15 DIAGNOSIS — K219 Gastro-esophageal reflux disease without esophagitis: Secondary | ICD-10-CM | POA: Diagnosis not present

## 2023-05-15 DIAGNOSIS — M170 Bilateral primary osteoarthritis of knee: Secondary | ICD-10-CM

## 2023-05-15 DIAGNOSIS — F411 Generalized anxiety disorder: Secondary | ICD-10-CM

## 2023-05-15 DIAGNOSIS — Z0000 Encounter for general adult medical examination without abnormal findings: Secondary | ICD-10-CM | POA: Diagnosis not present

## 2023-05-15 DIAGNOSIS — G373 Acute transverse myelitis in demyelinating disease of central nervous system: Secondary | ICD-10-CM | POA: Diagnosis not present

## 2023-05-15 DIAGNOSIS — F5101 Primary insomnia: Secondary | ICD-10-CM | POA: Diagnosis not present

## 2023-05-15 DIAGNOSIS — R031 Nonspecific low blood-pressure reading: Secondary | ICD-10-CM

## 2023-05-15 MED ORDER — MIRTAZAPINE 15 MG PO TABS: 15.0000 mg | ORAL_TABLET | Freq: Every day | ORAL | 1 refills | Status: AC

## 2023-05-15 MED ORDER — BENZONATATE 200 MG PO CAPS: 200.0000 mg | ORAL_CAPSULE | Freq: Three times a day (TID) | ORAL | 1 refills | Status: DC | PRN

## 2023-05-15 MED ORDER — ALPRAZOLAM 0.25 MG PO TABS: 0.2500 mg | ORAL_TABLET | Freq: Two times a day (BID) | ORAL | 2 refills | Status: DC | PRN

## 2023-05-15 MED ORDER — TRAMADOL HCL 50 MG PO TABS: 50.0000 mg | ORAL_TABLET | Freq: Two times a day (BID) | ORAL | 1 refills | Status: DC | PRN

## 2023-05-15 NOTE — Patient Instructions (Signed)

## 2023-05-15 NOTE — Progress Notes (Signed)
 Subjective:    Patient ID: Amy Black, female    DOB: [birth year], 83 y.o.   MRN: [id number]  Chief Complaint  Patient presents with   Medical Management of Chronic Issues   Allergies    Pollen been messing with her, wants something for cough    Pt presents to the office today for CPE and  chronic follow up.   She is followed by Cardiologists every 6 months for CAD and HTN.  She is followed by Oncologists/hematologists for iron anemia and hx adenocarcinoma.   She is followed by GI as needed for IBS.    She is also complaining of anxiety. She has been on xanax since 1982 and wants to stop this medication. We have tampered her down to Xanax 0.25 mg BID and added Remeron 15 mg. However, reports she has had a great deal of stress with her husband and has been getting panic attacks. Would like to try to increase back to TID from BID.  Hypertension This is a chronic problem. The current episode started more than 1 year ago. The problem has been waxing and waning since onset. The problem is uncontrolled. Associated symptoms include anxiety. Pertinent negatives include no malaise/fatigue, peripheral edema or shortness of breath. Risk factors for coronary artery disease include obesity and sedentary lifestyle. The current treatment provides moderate improvement.  Gastroesophageal Reflux She complains of belching, coughing, heartburn, a hoarse voice and a sore throat. This is a chronic problem. The current episode started more than 1 year ago. The problem occurs occasionally. The symptoms are aggravated by certain foods. She has tried an antacid and a PPI for the symptoms. The treatment provided moderate relief.  Anxiety Presents for follow-up visit. Symptoms include excessive worry, nervous/anxious behavior and restlessness. Patient reports no shortness of breath. Symptoms occur occasionally. The severity of symptoms is moderate.    Cough This is a new problem. The current episode started in  the past 7 days. The problem occurs every few minutes. The cough is Non-productive. Associated symptoms include heartburn and a sore throat. Pertinent negatives include no shortness of breath.  Arthritis Presents for follow-up visit. She complains of pain and stiffness. Affected locations include the left knee and right knee. Her pain is at a severity of 8/10.    Current opioids rx- Ultram 50 mg and xanax 0.25 mg # meds rx- 20 and 90 Effectiveness of current meds-stable  Adverse reactions from pain meds-none Morphine equivalent- 10  Pill count performed-No Last drug screen - 05/15/23 ( high risk q35m, moderate risk q26m, low risk yearly ) Urine drug screen today- Yes Was the NCCSR reviewed- yes  If yes were their any concerning findings? - none Pain contract signed on:05/15/23    Review of Systems  Constitutional:  Negative for malaise/fatigue.  HENT:  Positive for hoarse voice and sore throat.   Respiratory:  Positive for cough. Negative for shortness of breath.   Gastrointestinal:  Positive for heartburn.  Musculoskeletal:  Positive for arthritis and stiffness.  Psychiatric/Behavioral:  The patient is nervous/anxious.   All other systems reviewed and are negative.      Objective:   Physical Exam Vitals reviewed.  Constitutional:      General: She is not in acute distress.    Appearance: She is well-developed.  HENT:     Head: Normocephalic and atraumatic.  Eyes:     Pupils: Pupils are equal, round, and reactive to light.  Neck:     Thyroid: No thyromegaly.  Cardiovascular:     Rate and Rhythm: Normal rate and regular rhythm.     Heart sounds: Normal heart sounds. No murmur heard. Pulmonary:     Effort: Pulmonary effort is normal. No respiratory distress.     Breath sounds: Normal breath sounds. No wheezing.  Abdominal:     General: Bowel sounds are normal. There is no distension.     Palpations: Abdomen is soft.     Tenderness: There is no abdominal tenderness.   Musculoskeletal:        General: Tenderness (left knee with flexion) present. Normal range of motion.     Cervical back: Normal range of motion and neck supple.     Comments: Pain in right shoulder with abduction, unable to fully abduct   Skin:    General: Skin is warm and dry.  Neurological:     Mental Status: She is alert and oriented to person, place, and time.     Cranial Nerves: No cranial nerve deficit.     Motor: Weakness (using cane) present.     Gait: Gait abnormal.     Deep Tendon Reflexes: Reflexes are normal and symmetric.  Psychiatric:        Behavior: Behavior normal.        Thought Content: Thought content normal.        Judgment: Judgment normal.       BP 125/79   Pulse 80   Temp 98 F (36.7 C) (Temporal)   Ht 5\' 3"  (1.6 m)   Wt 147 lb (66.7 kg)   SpO2 99%   BMI 26.04 kg/m      Assessment & Plan:   Amy Black comes in today with chief complaint of Medical Management of Chronic Issues and Allergies (Pollen been messing with her, wants something for cough )   Diagnosis and orders addressed:  1. Annual physical exam (Primary) - CMP14+EGFR - Lipid panel  2. Primary hypertension - CMP14+EGFR  3. Gastroesophageal reflux disease without esophagitis - CMP14+EGFR  4. Atherosclerosis of native coronary artery of native heart with angina pectoris (HCC) - CMP14+EGFR  5. Vitamin D deficiency - CMP14+EGFR  6. GAD (generalized anxiety disorder) - ToxASSURE Select 13 (MW), Urine - ALPRAZolam (XANAX) 0.25 MG tablet; Take 1 tablet (0.25 mg total) by mouth 2 (two) times daily as needed for anxiety.  Dispense: 90 tablet; Refill: 2 - mirtazapine (REMERON) 15 MG tablet; Take 1 tablet (15 mg total) by mouth at bedtime.  Dispense: 90 tablet; Refill: 1 - CMP14+EGFR  7. Primary insomnia - mirtazapine (REMERON) 15 MG tablet; Take 1 tablet (15 mg total) by mouth at bedtime.  Dispense: 90 tablet; Refill: 1 - CMP14+EGFR  8. Transverse myelitis (HCC) -  ToxASSURE Select 13 (MW), Urine - CMP14+EGFR - traMADol (ULTRAM) 50 MG tablet; Take 1 tablet (50 mg total) by mouth every 12 (twelve) hours as needed. TAKE 1 TABLET EVERY 12 HOURS AS NEEDED  Dispense: 20 tablet; Refill: 1  9. Primary osteoarthritis of both knees - ToxASSURE Select 13 (MW), Urine - CMP14+EGFR - traMADol (ULTRAM) 50 MG tablet; Take 1 tablet (50 mg total) by mouth every 12 (twelve) hours as needed. TAKE 1 TABLET EVERY 12 HOURS AS NEEDED  Dispense: 20 tablet; Refill: 1  10. Acute bronchitis, unspecified organism - Take meds as prescribed - Use a cool mist humidifier  -Use saline nose sprays frequently -Force fluids -For any cough or congestion  Use plain Mucinex- regular strength or max strength is fine -For fever  or aces or pains- take tylenol or ibuprofen. -Follow up if symptoms worsen or do not improve  - benzonatate (TESSALON) 200 MG capsule; Take 1 capsule (200 mg total) by mouth 3 (three) times daily as needed.  Dispense: 30 capsule; Refill: 1    Labs reviewed from Oncologists and pending  Will increase xanax 0.25 mg to TID from BID Ultram as needed  Patient reviewed in [place] controlled database, no flags noted. Contract and drug screen are up to date.  Health Maintenance reviewed Diet and exercise encouraged  Follow up plan: 3 months     Jannifer Rodney, FNP

## 2023-05-16 ENCOUNTER — Telehealth: Payer: Self-pay

## 2023-05-16 LAB — CMP14+EGFR
ALT: 12 IU/L (ref 0–32)
AST: 14 IU/L (ref 0–40)
Albumin: 4.2 g/dL (ref 3.8–4.8)
Alkaline Phosphatase: 92 IU/L (ref 44–121)
BUN/Creatinine Ratio: 14 (ref 12–28)
BUN: 13 mg/dL (ref 8–27)
Bilirubin Total: 0.3 mg/dL (ref 0.0–1.2)
CO2: 23 mmol/L (ref 20–29)
Calcium: 9.7 mg/dL (ref 8.7–10.3)
Chloride: 96 mmol/L (ref 96–106)
Creatinine, Ser: 0.9 mg/dL (ref 0.57–1.00)
Globulin, Total: 2.7 g/dL (ref 1.5–4.5)
Glucose: 84 mg/dL (ref 70–99)
Potassium: 4 mmol/L (ref 3.5–5.2)
Sodium: 136 mmol/L (ref 134–144)
Total Protein: 6.9 g/dL (ref 6.0–8.5)
eGFR: 65 mL/min/{1.73_m2} (ref >59)

## 2023-05-16 LAB — LIPID PANEL
Chol/HDL Ratio: 2.4 ratio (ref 0.0–4.4)
Cholesterol, Total: 189 mg/dL (ref 100–199)
HDL: 80 mg/dL (ref >39)
LDL Chol Calc (NIH): 88 mg/dL (ref 0–99)
Triglycerides: 119 mg/dL (ref 0–149)
VLDL Cholesterol Cal: 21 mg/dL (ref 5–40)

## 2023-05-16 NOTE — Telephone Encounter (Signed)
 Copied from CRM [id number]. Topic: Clinical - Lab/Test Results >> May 16, 2023  2:09 PM Fredrica W wrote: Reason for CRM: Patient called to check on lab results. Let her know have not yet ben reviewed by provider. Patient would like a call back with results once reviewed. Thank You

## 2023-05-16 NOTE — Telephone Encounter (Signed)
 Will reach out with labs once resulted. LS

## 2023-05-17 LAB — TOXASSURE SELECT 13 (MW), URINE

## 2023-06-06 ENCOUNTER — Inpatient Hospital Stay: Payer: Medicare Other

## 2023-06-06 ENCOUNTER — Inpatient Hospital Stay: Payer: Medicare Other | Admitting: Medical Oncology

## 2023-06-13 ENCOUNTER — Inpatient Hospital Stay (HOSPITAL_BASED_OUTPATIENT_CLINIC_OR_DEPARTMENT_OTHER): Admitting: Medical Oncology

## 2023-06-13 ENCOUNTER — Encounter: Payer: Self-pay | Admitting: Medical Oncology

## 2023-06-13 ENCOUNTER — Inpatient Hospital Stay: Attending: Hematology & Oncology

## 2023-06-13 VITALS — BP 182/87 | HR 71 | Temp 98.4°F | Resp 18 | Ht 63.0 in | Wt 149.0 lb

## 2023-06-13 DIAGNOSIS — Z79899 Other long term (current) drug therapy: Secondary | ICD-10-CM | POA: Diagnosis not present

## 2023-06-13 DIAGNOSIS — D500 Iron deficiency anemia secondary to blood loss (chronic): Secondary | ICD-10-CM

## 2023-06-13 DIAGNOSIS — D509 Iron deficiency anemia, unspecified: Secondary | ICD-10-CM | POA: Insufficient documentation

## 2023-06-13 DIAGNOSIS — E162 Hypoglycemia, unspecified: Secondary | ICD-10-CM | POA: Diagnosis not present

## 2023-06-13 DIAGNOSIS — C801 Malignant (primary) neoplasm, unspecified: Secondary | ICD-10-CM | POA: Insufficient documentation

## 2023-06-13 DIAGNOSIS — D75839 Thrombocytosis, unspecified: Secondary | ICD-10-CM | POA: Insufficient documentation

## 2023-06-13 DIAGNOSIS — R5383 Other fatigue: Secondary | ICD-10-CM

## 2023-06-13 LAB — CBC WITH DIFFERENTIAL (CANCER CENTER ONLY)
Abs Immature Granulocytes: 0.04 10*3/uL (ref 0.00–0.07)
Basophils Absolute: 0.1 10*3/uL (ref 0.0–0.1)
Basophils Relative: 1 %
Eosinophils Absolute: 0.1 10*3/uL (ref 0.0–0.5)
Eosinophils Relative: 3 %
HCT: 34.1 % — ABNORMAL LOW (ref 36.0–46.0)
Hemoglobin: 11.1 g/dL — ABNORMAL LOW (ref 12.0–15.0)
Immature Granulocytes: 1 %
Lymphocytes Relative: 37 %
Lymphs Abs: 1.8 10*3/uL (ref 0.7–4.0)
MCH: 30.8 pg (ref 26.0–34.0)
MCHC: 32.6 g/dL (ref 30.0–36.0)
MCV: 94.7 fL (ref 80.0–100.0)
Monocytes Absolute: 0.5 10*3/uL (ref 0.1–1.0)
Monocytes Relative: 11 %
Neutro Abs: 2.4 10*3/uL (ref 1.7–7.7)
Neutrophils Relative %: 47 %
Platelet Count: 342 10*3/uL (ref 150–400)
RBC: 3.6 MIL/uL — ABNORMAL LOW (ref 3.87–5.11)
RDW: 13.4 % (ref 11.5–15.5)
WBC Count: 4.9 10*3/uL (ref 4.0–10.5)
nRBC: 0 % (ref 0.0–0.2)

## 2023-06-13 LAB — RETIC PANEL
Immature Retic Fract: 9 % (ref 2.3–15.9)
RBC.: 3.59 MIL/uL — ABNORMAL LOW (ref 3.87–5.11)
Retic Count, Absolute: 59.6 10*3/uL (ref 19.0–186.0)
Retic Ct Pct: 1.7 % (ref 0.4–3.1)
Reticulocyte Hemoglobin: 33.8 pg (ref >27.9)

## 2023-06-13 LAB — CMP (CANCER CENTER ONLY)
ALT: 12 U/L (ref 0–44)
AST: 15 U/L (ref 15–41)
Albumin: 4.8 g/dL (ref 3.5–5.0)
Alkaline Phosphatase: 68 U/L (ref 38–126)
Anion gap: 5 (ref 5–15)
BUN: 17 mg/dL (ref 8–23)
CO2: 31 mmol/L (ref 22–32)
Calcium: 10.1 mg/dL (ref 8.9–10.3)
Chloride: 100 mmol/L (ref 98–111)
Creatinine: 0.85 mg/dL (ref 0.44–1.00)
GFR, Estimated: >60 mL/min (ref >60)
Glucose, Bld: 58 mg/dL — ABNORMAL LOW (ref 70–99)
Potassium: 4.2 mmol/L (ref 3.5–5.1)
Sodium: 136 mmol/L (ref 135–145)
Total Bilirubin: 0.3 mg/dL (ref 0.0–1.2)
Total Protein: 7.4 g/dL (ref 6.5–8.1)

## 2023-06-13 LAB — FERRITIN: Ferritin: 305 ng/mL (ref 11–307)

## 2023-06-13 LAB — VITAMIN B12: Vitamin B-12: 1395 pg/mL — ABNORMAL HIGH (ref 180–914)

## 2023-06-13 NOTE — Progress Notes (Signed)
 Hematology and Oncology Follow Up Visit  Amy Black [id number] [birth year] 83 y.o. 06/18/2023   Principle Diagnosis:  Transient thrombocytosis History transverse myelitis History of adenosquamous carcinoma of unknown primary Iron  def anemia   Current Therapy:        Fusion Plus -- daily   Interim History:  Amy Black is here today with her son for follow-up  At our last visit on 08/17/2022 we discussed CT imaging given her symptoms of abdominal pain, unintentional weight loss, etc. This was ordered but has not been performed. She also was going to discuss her fatigue and dizziness with her cardiologist which she has done.  She reports that she took her premeds I gave her at her last visit for a cardiac scan she had done with contrast. She tolerated this well and didn't have any trouble with the IV contrast or premeds.   Today she reports that she is feeling well. She has no concerns or complaints.  No falls or syncope reported.  No swelling, tenderness, numbness or tingling in her extremities.  No fever, chills, n/v, cough, rash, dizziness, SOB, chest pain, palpitations, abdominal pain or changes in bowel or bladder habits.  She declines BP recheck today- states that she had not taken her BP medications yet today but will when she gets home.  She does have night sweats  Appetite and hydration are good.   Wt Readings from Last 3 Encounters:  06/13/23 149 lb (67.6 kg)  05/15/23 147 lb (66.7 kg)  04/06/23 149 lb (67.6 kg)   ECOG Performance Status: 1 - Symptomatic but completely ambulatory  Medications:  Allergies as of 06/13/2023       Reactions   Iodine Itching   Ivp Dye [iodinated Contrast Media] Itching   Amlodipine  Other (See Comments)   headaches   Lexapro  [escitalopram  Oxalate] Other (See Comments)   Drunk, High feeling   Tizanidine  Other (See Comments)   Other reaction(s):Drunk feeling the next day       Cymbalta  [duloxetine  Hcl] Other (See Comments)    sleepiness   Duloxetine  Other (See Comments)   Other reaction(s): sleepy   Gabapentin  Other (See Comments)   sleepiness   Zanaflex  [tizanidine  Hcl] Other (See Comments)   Very drunk feeling next day        Medication List        Accurate as of Jun 13, 2023 11:59 PM. If you have any questions, ask your nurse or doctor.          STOP taking these medications    benzonatate  200 MG capsule Commonly known as: TESSALON  Stopped by: Sharla Davis       TAKE these medications    ALPRAZolam  0.25 MG tablet Commonly known as: XANAX  Take 1 tablet (0.25 mg total) by mouth 2 (two) times daily as needed for anxiety.   cetirizine  10 MG tablet Commonly known as: ZYRTEC  Take 1 tablet (10 mg total) by mouth daily.   diphenhydrAMINE  50 MG tablet Commonly known as: BENADRYL  Take 1 tablet (50 mg total) by mouth once for 1 dose. Take the morning of CT   hydrOXYzine  10 MG tablet Commonly known as: ATARAX  Take 1 tablet (10 mg total) by mouth 2 (two) times daily.   Hyoscyamine  Sulfate SL 0.125 MG Subl Commonly known as: Levsin/SL Place 0.125 mg under the tongue every 4 (four) hours as needed (abd cramping and diarrhea).   isosorbide  mononitrate 120 MG 24 hr tablet Commonly known as: IMDUR  Take 1 tablet (120  mg total) by mouth daily.   metoprolol  tartrate 25 MG tablet Commonly known as: LOPRESSOR  Take 1 tablet (25 mg total) by mouth 2 (two) times daily.   mirtazapine  15 MG tablet Commonly known as: REMERON  Take 1 tablet (15 mg total) by mouth at bedtime.   nitroGLYCERIN  0.4 MG SL tablet Commonly known as: NITROSTAT  DISSOLVE ONE TABLET UNDER TONGUE EVERY 5 MINUTES UP TO 3 DOSES AS NEEDED FOR CHEST PAIN   omeprazole  20 MG capsule Commonly known as: PRILOSEC Take 1 capsule (20 mg total) by mouth daily.   traMADol  50 MG tablet Commonly known as: ULTRAM  Take 1 tablet (50 mg total) by mouth every 12 (twelve) hours as needed. TAKE 1 TABLET EVERY 12 HOURS AS NEEDED    triamterene -hydrochlorothiazide  37.5-25 MG capsule Commonly known as: DYAZIDE  TAKE ONE CAPSULE BY MOUTH 3 TIMES PER WEEK   verapamil  180 MG CR tablet Commonly known as: CALAN -SR Take 1 tablet (180 mg total) by mouth at bedtime.        Allergies:  Allergies  Allergen Reactions   Iodine Itching   Ivp Dye [Iodinated Contrast Media] Itching   Amlodipine  Other (See Comments)    headaches   Lexapro  [Escitalopram  Oxalate] Other (See Comments)    Drunk, High feeling   Tizanidine  Other (See Comments)    Other reaction(s):Drunk feeling the next day       Cymbalta  [Duloxetine  Hcl] Other (See Comments)    sleepiness   Duloxetine  Other (See Comments)    Other reaction(s): sleepy   Gabapentin  Other (See Comments)    sleepiness   Zanaflex  [Tizanidine  Hcl] Other (See Comments)    Very drunk feeling next day    Past Medical History, Surgical history, Social history, and Family History were reviewed and updated.  Review of Systems: All other 10 point review of systems is negative.   Physical Exam:  height is 5\' 3"  (1.6 m) and weight is 149 lb (67.6 kg). Her oral temperature is 98.4 F (36.9 C). Her blood pressure is 182/87 (abnormal) and her pulse is 71. Her respiration is 18 and oxygen saturation is 100%.   Wt Readings from Last 3 Encounters:  06/13/23 149 lb (67.6 kg)  05/15/23 147 lb (66.7 kg)  04/06/23 149 lb (67.6 kg)   Constitutional: AA X 3 Ocular: Sclerae unicteric, pupils equal, round and reactive to light Ear-nose-throat: Oropharynx clear, dentition fair Lymphatic: No cervical or supraclavicular adenopathy Lungs no rales or rhonchi, good excursion bilaterally Heart regular rate and rhythm, no murmur appreciated Abd soft MSK no focal spinal tenderness, no joint edema Neuro: non-focal, well-oriented, appropriate affect Skin: No rash  Lab Results  Component Value Date   WBC 4.9 06/13/2023   HGB 11.1 (L) 06/13/2023   HCT 34.1 (L) 06/13/2023   MCV 94.7 06/13/2023    PLT 342 06/13/2023   Lab Results  Component Value Date   FERRITIN 305 06/13/2023   IRON  89 06/13/2023   TIBC 262 06/13/2023   UIBC 173 06/13/2023   IRONPCTSAT 34 (H) 06/13/2023   Lab Results  Component Value Date   RETICCTPCT 1.7 06/13/2023   RBC 3.59 (L) 06/13/2023   RBC 3.60 (L) 06/13/2023   No results found for: "KPAFRELGTCHN", "LAMBDASER", "KAPLAMBRATIO" No results found for: "IGGSERUM", "IGA", "IGMSERUM" No results found for: "TOTALPROTELP", "ALBUMINELP", "A1GS", "A2GS", "BETS", "BETA2SER", "GAMS", "MSPIKE", "SPEI"   Chemistry      Component Value Date/Time   NA 136 06/13/2023 1338   NA 136 05/15/2023 1434   NA 138 01/28/2016  1303   K 4.2 06/13/2023 1338   K 4.2 07/28/2016 1243   K 4.2 01/28/2016 1303   CL 100 06/13/2023 1338   CL 98 07/28/2016 1243   CL 101 07/20/2014 1131   CO2 31 06/13/2023 1338   CO2 29 07/28/2016 1243   CO2 30 (H) 01/28/2016 1303   BUN 17 06/13/2023 1338   BUN 13 05/15/2023 1434   BUN 10.0 01/28/2016 1303   CREATININE 0.85 06/13/2023 1338   CREATININE 0.94 07/28/2016 1243   CREATININE 0.8 01/28/2016 1303      Component Value Date/Time   CALCIUM  10.1 06/13/2023 1338   CALCIUM  9.9 07/28/2016 1243   CALCIUM  10.0 01/28/2016 1303   ALKPHOS 68 06/13/2023 1338   ALKPHOS 105 07/28/2016 1243   ALKPHOS 123 01/28/2016 1303   AST 15 06/13/2023 1338   AST 13 01/28/2016 1303   ALT 12 06/13/2023 1338   ALT 12 01/28/2016 1303   BILITOT 0.3 06/13/2023 1338   BILITOT 0.51 01/28/2016 1303     No diagnosis found.   Impression and Plan: Ms. Suchan is a very pleasant 83yo African American female with history of transient thrombocytosis and also history of adenosquamous carcinoma with unknown primary.   CT scan from 03/08/2023 fortunately did not show any evidence of metastatic disease of the chest, abdomen or pelvis.   CBC shows a Hgb of 11.1, platelet count of 342 CMP shows slight hypoglycemia. Snacks and drinks given to patient and she was  monitored for 15 minutes before discharged upon stating that she was feeling well. She will monitor her blood sugar at home as well and follow up with PCP.  Iron  studies and B12/folate pending.   RTC 3 months APP, labs (CBC w/, CMP, iron , ferritin, B12, folate-[place]   Sharla Davis, PA-C 5/12/20259:12 AM

## 2023-06-14 LAB — IRON AND IRON BINDING CAPACITY (CC-WL,HP ONLY)
Iron: 89 ug/dL (ref 28–170)
Saturation Ratios: 34 % — ABNORMAL HIGH (ref 10.4–31.8)
TIBC: 262 ug/dL (ref 250–450)
UIBC: 173 ug/dL (ref 148–442)

## 2023-06-15 ENCOUNTER — Other Ambulatory Visit: Payer: Self-pay | Admitting: Nurse Practitioner

## 2023-06-18 ENCOUNTER — Telehealth: Payer: Self-pay

## 2023-06-18 ENCOUNTER — Encounter: Payer: Self-pay | Admitting: Family

## 2023-06-18 DIAGNOSIS — D509 Iron deficiency anemia, unspecified: Secondary | ICD-10-CM

## 2023-06-18 MED ORDER — FUSION PLUS PO CAPS: 1.0000 | ORAL_CAPSULE | Freq: Every day | ORAL | 5 refills | Status: DC

## 2023-06-18 NOTE — Telephone Encounter (Signed)
 Spoke with pt earlier and she was going to check and see if her pharmacy carried fusion plus OTC. Pt CB and stated they did not and that she would need an Rx. ERX sent to requested pharmacy.

## 2023-06-18 NOTE — Telephone Encounter (Addendum)
 Called patient to inform her that per Sunnie England, PA no IV iron  needed a this time. Told her that Isa Manuel wants her to continue taking Fusion Plus vitamin as she has been, and take her vitamin B12 supplement every other day instead of daily.  Patient verbalized understanding.

## 2023-06-18 NOTE — Telephone Encounter (Signed)
-----   Message from Sharla Davis sent at 06/18/2023 10:23 AM EDT ----- No IV iron  needed but I want to have her continue her Fusion Plus vitamin as she has been  B12 is better- lets have her take her B12 supplement every other day instead of daily.

## 2023-06-18 NOTE — Telephone Encounter (Signed)
Called and informed patient of lab results, patient verbalized understanding and denies any questions or concerns at this time.   

## 2023-06-27 ENCOUNTER — Ambulatory Visit: Payer: Self-pay

## 2023-06-27 NOTE — Telephone Encounter (Signed)
 Chief Complaint: foot pain Symptoms: pain Frequency: chronic Pertinent Negatives: Patient denies signs of infection, fever, difficulty ambulating, swelling Disposition: [] ED /[] Urgent Care (no appt availability in office) / [x] Appointment(In office/virtual)/ []  [facility]/ [] Home Care/ [] Refused Recommended Disposition /[] [facility]/ []  Follow-up with PCP Additional Notes:  Chronic right foot pain, history of transverse myelitis 10 years ago and has had foot pain since. Calling to schedule because pain was mild and tolerable until last night when pain elevated to 7/10. No signs of infection. Denies swelling. Ambulating at baseline. Requesting evaluation. Acute evaluation advised and scheduled with PCP 06/28/23. Educated on care advice as documented in protocol, patient verbalized understanding.    Copied from CRM [id number]. Topic: Clinical - Red Word Triage >> Jun 27, 2023  5:36 PM Tiffany H wrote: Patient called to advise that her right foot started hurting last night. Patient advised that she has intermittent pain in that foot but last night, it shot up to a 7. Please assist. Reason for Disposition  [1] MODERATE pain (e.g., interferes with normal activities, limping) AND [2] present > 3 days  Protocols used: Foot Pain-A-AH

## 2023-06-28 ENCOUNTER — Encounter: Payer: Self-pay | Admitting: Family

## 2023-06-28 ENCOUNTER — Ambulatory Visit: Admitting: Family

## 2023-06-28 VITALS — BP 141/79 | HR 81 | Temp 97.4°F | Ht 63.0 in | Wt 148.2 lb

## 2023-06-28 DIAGNOSIS — R252 Cramp and spasm: Secondary | ICD-10-CM | POA: Diagnosis not present

## 2023-06-28 DIAGNOSIS — R350 Frequency of micturition: Secondary | ICD-10-CM | POA: Diagnosis not present

## 2023-06-28 LAB — URINALYSIS, COMPLETE
Bilirubin, UA: NEGATIVE
Glucose, UA: NEGATIVE
Nitrite, UA: NEGATIVE
Protein,UA: NEGATIVE
RBC, UA: NEGATIVE
Specific Gravity, UA: 1.015 (ref 1.005–1.030)
Urobilinogen, Ur: 0.2 mg/dL (ref 0.2–1.0)
pH, UA: 6 (ref 5.0–7.5)

## 2023-06-28 NOTE — Telephone Encounter (Signed)
 Noted

## 2023-06-28 NOTE — Patient Instructions (Signed)
 Muscle Cramps and Spasms Muscle cramps and spasms occur when a muscle or muscles tighten and you have no control over this tightening (involuntary muscle contraction). They are a common problem that can happen in any muscle. The most common place is in the calf muscles of the leg. There are a few ways that muscle cramps and spasms differ: Muscle cramps are painful. They come and go and may last for a few seconds or up to 15 minutes. Muscle cramps are often more forceful and last longer than muscle spasms. Muscle spasms may or may not be painful. They may last just a few seconds or last much longer. Certain conditions, such as diabetes or Parkinson's disease, can make you more likely to have cramps or spasms. But in most cases, cramps and spasms are not caused by other conditions. Common causes include: Overexertion. This is when you do more physical work or exercise than your body is ready for. Overuse from doing the same movements too many times. Staying in one position for too long. Improper preparation, form, or technique when playing a sport or doing an activity. Not enough water or other fluids in your body (dehydration). Other causes may include: Injury. Side effects of some medicines. Too few salts and minerals in your body (electrolytes), such as potassium and calcium. This could happen if you are taking water pills (diuretics) or if you are pregnant. In many cases, the cause of muscle cramps or spasms is not known. Follow these instructions at home: Eating and drinking Drink enough fluid to keep your pee (urine) pale yellow. This can help prevent cramps or spasms. Eat a healthy diet that includes a lot of nutrients to help your muscles work. A healthy diet includes fruits and vegetables, lean protein, whole grains, and low-fat or nonfat dairy products. Managing pain and stiffness     Try to massage, stretch, and relax the affected muscle. Do this for a few minutes at a time. If told,  put ice on the muscles. This may help if you are sore or have pain after a cramp or spasm. Put ice in a plastic bag. Place a towel between your skin and the bag. Leave the ice on for 20 minutes, 2-3 times a day. If told, apply heat to tight or tense muscles as often as told by your health care provider. Use the heat source that your provider recommends, such as a moist heat pack or a heating pad. Place a towel between your skin and the heat source. Leave the heat on for 20-30 minutes. If your skin turns bright red, remove the ice or heat right away to prevent skin damage. The risk of damage is higher if you cannot feel pain, heat, or cold. Take hot showers or baths to help relax tight muscles. General instructions If you are having cramps often, avoid intense exercise for a few days. Take over-the-counter and prescription medicines only as told by your provider. Watch for any changes in your symptoms. Contact a health care provider if: Your cramps or spasms get more severe or happen more often. Your cramps or spasms do not get better over time. This information is not intended to replace advice given to you by your health care provider. Make sure you discuss any questions you have with your health care provider. Document Revised: 09/13/2021 Document Reviewed: 09/13/2021 Elsevier Patient Education  2024 [employer].

## 2023-06-28 NOTE — Progress Notes (Signed)
 Subjective:    Patient ID: Amy Black, female    DOB: Jul 23, 1940, 83 y.o.   MRN: [id number]  Chief Complaint  Patient presents with   Foot Pain   PT presents to the office today with right foot pain that started two day ago. Denies any injury. Reports she will have a foot spasm that comes and goes and her pain can be a 9 out 10. She reports she has taken tylenol  as needed with mild relief.  Foot Pain This is a new problem. The current episode started in the past 7 days. The problem occurs intermittently. Pertinent negatives include no nausea or vomiting.  Urinary Frequency  This is a new problem. The current episode started in the past 7 days. The problem occurs intermittently. The problem has been waxing and waning. The patient is experiencing no pain. Associated symptoms include frequency and urgency. Pertinent negatives include no hematuria, nausea or vomiting. She has tried increased fluids for the symptoms.      Review of Systems  Gastrointestinal:  Negative for nausea and vomiting.  Genitourinary:  Positive for frequency and urgency. Negative for hematuria.    Social History   Socioeconomic History   Marital status: Married    Spouse name: Austine Blunt   Number of children: 2   Years of education: 12   Highest education level: High school graduate  Occupational History   Occupation: Disabilty   Occupation: DISABILITY    Employer: UNEMPLOYED  Tobacco Use   Smoking status: Former    Current packs/day: 0.00    Average packs/day: 0.5 packs/day for 4.0 years (2.0 ttl pk-yrs)    Types: Cigarettes    Start date: 12/07/1968    Quit date: 11/20/1972    Years since quitting: 50.6   Smokeless tobacco: Never   Tobacco comments:    quit 40 years ago  Vaping Use   Vaping status: Never Used  Substance and Sexual Activity   Alcohol use: No    Alcohol/week: 0.0 standard drinks of alcohol   Drug use: No   Sexual activity: Not Currently    Birth control/protection: Surgical   Other Topics Concern   Not on file  Social History Narrative   Disabled, lives with husband and daughter.  Enjoys shopping. Lives in two story home   1 son and 1 daughter.   1 grandson.   Caffeine 2 cans of soda and tea   Right handed   Social Drivers of Health   Financial Resource Strain: Low Risk  (03/22/2022)   Overall Financial Resource Strain (CARDIA)    Difficulty of Paying Living Expenses: Not hard at all  Food Insecurity: No Food Insecurity (03/22/2022)   Hunger Vital Sign    Worried About Running Out of Food in the Last Year: Never true    Ran Out of Food in the Last Year: Never true  Transportation Needs: No Transportation Needs (03/22/2022)   PRAPARE - Administrator, Civil Service (Medical): No    Lack of Transportation (Non-Medical): No  Physical Activity: Inactive (03/22/2022)   Exercise Vital Sign    Days of Exercise per Week: 0 days    Minutes of Exercise per Session: 0 min  Stress: No Stress Concern Present (03/22/2022)   [employer] of Occupational Health - Occupational Stress Questionnaire    Feeling of Stress : Not at all  Social Connections: Moderately Integrated (03/22/2022)   Social Connection and Isolation Panel [NHANES]    Frequency of Communication with Friends  and Family: More than three times a week    Frequency of Social Gatherings with Friends and Family: More than three times a week    Attends Religious Services: More than 4 times per year    Active Member of [employer] or Organizations: No    Attends Engineer, structural: Never    Marital Status: Married   Family History  Problem Relation Age of Onset   Bladder Cancer Mother    Heart disease Mother    Hypertension Mother    Heart disease Father    Stroke Father    Hyperlipidemia Sister    Depression Sister    Diabetes Brother    Seizures Brother    Hypertension Brother    Diabetes Other        Multiple family members on both sides    Coronary artery disease Other         Multiple family members on both sides    Colon cancer Neg Hx    Esophageal cancer Neg Hx    Pancreatic cancer Neg Hx    Rectal cancer Neg Hx    Stomach cancer Neg Hx    Breast cancer Neg Hx         Objective:   Physical Exam    There were no vitals taken for this visit.     Assessment & Plan:  Amy Black comes in today with chief complaint of Foot Pain   Diagnosis and orders addressed:  1. Urinary frequency (Primary) Urine pending  Force fluids - Urinalysis, Complete  2. Foot spasms Force fluids CMP on 05/15/23 stable  May need to decrease dyazide  medication.  - Magnesium   Follow up if symptoms worsen or do not improve     Tommas Fragmin, FNP

## 2023-06-29 ENCOUNTER — Other Ambulatory Visit: Payer: Self-pay | Admitting: Family

## 2023-06-29 ENCOUNTER — Ambulatory Visit: Payer: Self-pay | Admitting: Family

## 2023-06-29 LAB — MAGNESIUM: Magnesium: 2 mg/dL (ref 1.6–2.3)

## 2023-06-29 MED ORDER — DICLOFENAC SODIUM 75 MG PO TBEC: 75.0000 mg | DELAYED_RELEASE_TABLET | Freq: Two times a day (BID) | ORAL | 0 refills | Status: DC

## 2023-07-04 ENCOUNTER — Telehealth: Payer: Self-pay | Admitting: Family

## 2023-07-04 NOTE — Telephone Encounter (Signed)
 Okay to write letter

## 2023-07-05 ENCOUNTER — Encounter: Payer: Self-pay | Admitting: Family Medicine

## 2023-07-05 NOTE — Telephone Encounter (Signed)
 Ok for letter

## 2023-07-05 NOTE — Telephone Encounter (Signed)
 Letter printed patient aware.

## 2023-07-12 ENCOUNTER — Telehealth: Payer: Self-pay | Admitting: Family

## 2023-07-12 DIAGNOSIS — Z0279 Encounter for issue of other medical certificate: Secondary | ICD-10-CM

## 2023-07-12 NOTE — Telephone Encounter (Signed)
 Dropped off and paid FMLA form fee.  Will put in Cath[facility] box.

## 2023-07-17 NOTE — Telephone Encounter (Signed)
 PCP completed and signed FMLA forms. They have been faxed to [employer] at fax number [phone]. Patient has been contacted and informed they are complete.

## 2023-07-18 ENCOUNTER — Encounter: Payer: Self-pay | Admitting: Podiatry

## 2023-07-18 ENCOUNTER — Ambulatory Visit (INDEPENDENT_AMBULATORY_CARE_PROVIDER_SITE_OTHER): Admitting: Podiatry

## 2023-07-18 DIAGNOSIS — M7751 Other enthesopathy of right foot: Secondary | ICD-10-CM

## 2023-07-18 MED ORDER — TRIAMCINOLONE ACETONIDE 10 MG/ML IJ SUSP
10.0000 mg | Freq: Once | INTRAMUSCULAR | Status: AC
  Administered 2023-07-18: 10 mg via INTRA_ARTICULAR

## 2023-07-19 NOTE — Progress Notes (Signed)
 Subjective:   Patient ID: Amy Black, female   DOB: 83 y.o.   MRN: [id number]   HPI Patient presents with a lot of pain on top of the right foot states it has been inflamed and hard to walk on comfortably   ROS      Objective:  Physical Exam  Neurovascular status intact inflammation of the second MPJ right with fluid buildup around the joint surface     Assessment:  Inflammatory capsulitis second MPJ right with pain     Plan:  H&P reviewed sterile prep injected the joint periarticular 3 mg Dexasone Kenalog  5 mg Xylocaine  applied sterile dressing

## 2023-08-02 ENCOUNTER — Other Ambulatory Visit: Payer: Self-pay | Admitting: Family

## 2023-08-02 DIAGNOSIS — M62838 Other muscle spasm: Secondary | ICD-10-CM

## 2023-08-13 ENCOUNTER — Telehealth: Payer: Self-pay | Admitting: Physician Assistant

## 2023-08-13 NOTE — Telephone Encounter (Signed)
 Pt c/o medication issue:  1. Name of Medication: metoprolol  tartrate (LOPRESSOR ) 25 MG tablet (Expired)   2. How are you currently taking this medication (dosage and times per day)? As written  3. Are you having a reaction (difficulty breathing--STAT)? No   4. What is your medication issue? Feet/Legs sweeling. She wants to know if she can cut down to taking 1 pill a day.

## 2023-08-13 NOTE — Telephone Encounter (Signed)
 Called patient back about message. Patient stated she started taking metoprolol  25 mg BID and she started having BLE edema. Patient stated she has decide to start medication back, but only take one pill. Informed patient that this medication is a twice a day medication, so if the provider was going to cut back on her metoprolol , she would need to take 12.5 mg BID. Will send message to Scot Ford PA to see if patient is okay to decrease her metoprolol  12.5 mg BID.

## 2023-08-14 ENCOUNTER — Encounter: Payer: Self-pay | Admitting: Family

## 2023-08-14 ENCOUNTER — Ambulatory Visit: Admitting: Family

## 2023-08-14 VITALS — BP 148/70 | HR 43 | Temp 97.0°F | Ht 63.0 in | Wt 149.4 lb

## 2023-08-14 DIAGNOSIS — K219 Gastro-esophageal reflux disease without esophagitis: Secondary | ICD-10-CM | POA: Diagnosis not present

## 2023-08-14 DIAGNOSIS — F411 Generalized anxiety disorder: Secondary | ICD-10-CM

## 2023-08-14 DIAGNOSIS — I10 Essential (primary) hypertension: Secondary | ICD-10-CM

## 2023-08-14 DIAGNOSIS — R079 Chest pain, unspecified: Secondary | ICD-10-CM

## 2023-08-14 DIAGNOSIS — M170 Bilateral primary osteoarthritis of knee: Secondary | ICD-10-CM | POA: Diagnosis not present

## 2023-08-14 DIAGNOSIS — R531 Weakness: Secondary | ICD-10-CM

## 2023-08-14 DIAGNOSIS — I25119 Atherosclerotic heart disease of native coronary artery with unspecified angina pectoris: Secondary | ICD-10-CM | POA: Diagnosis not present

## 2023-08-14 DIAGNOSIS — E559 Vitamin D deficiency, unspecified: Secondary | ICD-10-CM | POA: Diagnosis not present

## 2023-08-14 DIAGNOSIS — I440 Atrioventricular block, first degree: Secondary | ICD-10-CM | POA: Diagnosis not present

## 2023-08-14 DIAGNOSIS — G373 Acute transverse myelitis in demyelinating disease of central nervous system: Secondary | ICD-10-CM | POA: Diagnosis not present

## 2023-08-14 MED ORDER — ALPRAZOLAM 0.25 MG PO TABS: 0.2500 mg | ORAL_TABLET | Freq: Two times a day (BID) | ORAL | 2 refills | Status: DC | PRN

## 2023-08-14 MED ORDER — TRAMADOL HCL 50 MG PO TABS: 50.0000 mg | ORAL_TABLET | Freq: Two times a day (BID) | ORAL | 1 refills | Status: DC | PRN

## 2023-08-14 NOTE — Progress Notes (Signed)
 Subjective:    Patient ID: Amy Black, female    DOB: January 05, 1941, 83 y.o.   MRN: [id number]  Chief Complaint  Patient presents with   Medical Management of Chronic Issues    Having chest pain called heart doctor and they wanted to change meds.   Pt presents to the office today for chronic follow up.   She is followed by Cardiologists every 6 months for CAD and HTN. Complaining of chest pain today. States that started last Wednesday and coming and going. She called her Cardiologists who decreased her metoprolol  to 25 mg BID from 50 mg BID yesterday. This has helped her chest pain slightly.   She is followed by Oncologists/hematologists for iron  anemia and hx adenocarcinoma.   She is followed by GI as needed for IBS.    She is also complaining of anxiety. She has been on xanax  since 1982 and wants to stop this medication. We have tampered her down to Xanax  0.25 mg BID and added Remeron  15 mg. However, reports she has had a great deal of stress with her husband and has been getting panic attacks. Would like to try to increase back to TID from BID.  Hypertension This is a chronic problem. The current episode started more than 1 year ago. The problem has been waxing and waning since onset. The problem is uncontrolled. Associated symptoms include anxiety, chest pain and malaise/fatigue. Pertinent negatives include no peripheral edema. Risk factors for coronary artery disease include obesity and sedentary lifestyle. The current treatment provides moderate improvement.  Gastroesophageal Reflux She complains of belching and chest pain. This is a chronic problem. The current episode started more than 1 year ago. The problem occurs occasionally. The symptoms are aggravated by certain foods. She has tried an antacid and a PPI for the symptoms. The treatment provided moderate relief.  Anxiety Presents for follow-up visit. Symptoms include chest pain, excessive worry, nervous/anxious behavior and  restlessness. Symptoms occur most days. The severity of symptoms is moderate.    Arthritis Presents for follow-up visit. She complains of pain and stiffness. Affected locations include the left knee and right knee. Her pain is at a severity of 7/10.  Chest Pain  This is a new problem. The current episode started in the past 7 days. The problem occurs intermittently. The pain is present in the substernal region. The pain is at a severity of 9/10. The pain is mild. The pain does not radiate. Associated symptoms include malaise/fatigue. She has tried rest for the symptoms. The treatment provided mild relief.    Current opioids rx- Ultram  50 mg and xanax  0.25 mg # meds rx- 20 and 90 Effectiveness of current meds-stable  Adverse reactions from pain meds-none Morphine  equivalent- 10  Pill count performed-No Last drug screen - 05/15/23 ( high risk q61m, moderate risk q35m, low risk yearly ) Urine drug screen today- Yes Was the [facility] reviewed- yes  If yes were their any concerning findings? - none Pain contract signed on:05/15/23    Review of Systems  Constitutional:  Positive for malaise/fatigue.  Cardiovascular:  Positive for chest pain.  Musculoskeletal:  Positive for stiffness.  Psychiatric/Behavioral:  The patient is nervous/anxious.   All other systems reviewed and are negative.      Objective:   Physical Exam Vitals reviewed.  Constitutional:      General: She is not in acute distress.    Appearance: She is well-developed.  HENT:     Head: Normocephalic and atraumatic.  Eyes:  Pupils: Pupils are equal, round, and reactive to light.  Neck:     Thyroid : No thyromegaly.  Cardiovascular:     Rate and Rhythm: Normal rate and regular rhythm.     Heart sounds: Normal heart sounds. No murmur heard. Pulmonary:     Effort: Pulmonary effort is normal. No respiratory distress.     Breath sounds: Normal breath sounds. No wheezing.  Abdominal:     General: Bowel sounds are normal.  There is no distension.     Palpations: Abdomen is soft.     Tenderness: There is no abdominal tenderness.  Musculoskeletal:        General: Tenderness (left knee with flexion) present. Normal range of motion.     Cervical back: Normal range of motion and neck supple.     Comments: Pain in right shoulder with abduction, unable to fully abduct   Skin:    General: Skin is warm and dry.  Neurological:     Mental Status: She is alert and oriented to person, place, and time.     Cranial Nerves: No cranial nerve deficit.     Motor: Weakness (using cane) present.     Gait: Gait abnormal.     Deep Tendon Reflexes: Reflexes are normal and symmetric.  Psychiatric:        Behavior: Behavior normal.        Thought Content: Thought content normal.        Judgment: Judgment normal.       BP (!) 148/70   Pulse (!) 43   Temp (!) 97 F (36.1 C) (Temporal)   Ht 5' 3 (1.6 m)   Wt 149 lb 6.4 oz (67.8 kg)   SpO2 98%   BMI 26.47 kg/m      Assessment & Plan:   Amy Black comes in today with chief complaint of Medical Management of Chronic Issues (Having chest pain called heart doctor and they wanted to change meds.)   Diagnosis and orders addressed:  1. Chest pain, unspecified type (Primary) - EKG 12-Lead  2. GAD (generalized anxiety disorder) - ALPRAZolam  (XANAX ) 0.25 MG tablet; Take 1 tablet (0.25 mg total) by mouth 2 (two) times daily as needed for anxiety.  Dispense: 90 tablet; Refill: 2  3. Transverse myelitis (HCC)  - traMADol  (ULTRAM ) 50 MG tablet; Take 1 tablet (50 mg total) by mouth every 12 (twelve) hours as needed. TAKE 1 TABLET EVERY 12 HOURS AS NEEDED  Dispense: 20 tablet; Refill: 1  4. Primary osteoarthritis of both knees - traMADol  (ULTRAM ) 50 MG tablet; Take 1 tablet (50 mg total) by mouth every 12 (twelve) hours as needed. TAKE 1 TABLET EVERY 12 HOURS AS NEEDED  Dispense: 20 tablet; Refill: 1  5. Atherosclerosis of native coronary artery of native heart with  angina pectoris (HCC)   6. Gastroesophageal reflux disease without esophagitis  7. Primary hypertension   8. Vitamin D  deficiency   9. First degree AV block   10. Weakness    Labs reviewed from Oncologists  Chest pain on and off- Will route note to Cardiologists. Improved today since decreasing metoprolol  dose, but still having bradycardia.  EKG shows First degree A-V block, Continue xanax , risks discussed  Ultram  as needed  Patient reviewed in [place] controlled database, no flags noted. Contract and drug screen are up to date.  Health Maintenance reviewed Diet and exercise encouraged  Follow up plan: 3 months     Bari Learn, FNP

## 2023-08-14 NOTE — Patient Instructions (Signed)
 Chest Pain (Angina): What to Know Angina is pain or discomfort in the chest. It can also be felt in the neck, arm, jaw, or back. Angina is caused by not having enough blood flow to the heart wall. Angina may be a warning that you're at risk for having a heart attack. What are the causes? Angina is most often caused by build-up of plaque in your arteries that makes it hard for blood to flow. Plaque narrows and blocks the arteries of the heart. Plaque is made of fats and cholesterol. Angina is also caused by: Sudden spasms of the muscles in the arteries of the heart. Small artery disease. Heart valve problems. A tear in an artery of your heart. Weakness of the heart muscle. What increases the risk? Main risks Having high cholesterol. High blood pressure. Having diabetes. Family history of heart disease. Not exercising or moving enough. Having had radiation treatment to the left side of your chest. Other risks Using tobacco products. Being very overweight. Eating foods that have a lot of unhealthy fats. Feeling stressed or having depression. Using drugs, such as cocaine. What are the signs or symptoms? Symptoms in all people Chest pain, which may: Feel like a crushing or squeezing in the chest. Feel like a tightness, pressure, or heaviness in the chest. Last for more than a few minutes at a time. Stop and come back. Pain in the neck, arm, jaw, or back. Heartburn or upset stomach for no reason. Being short of breath. Feeling like you may throw up. Sudden cold sweats. Other symptoms in females Tiredness or weakness. Worry and anxiety. Dizziness or fainting. How is this diagnosed?  Your symptoms and medical history. Blood tests. Electrocardiogram (ECG) to measure the electrical activity of your heart. Stress test to look for signs of a blocked artery. CT angiogram to examine your heart and the blood flow to it. Coronary angiogram to check for a blocked artery. How is this  treated? Medicines to: Prevent blood clots. Relax blood vessels and improve blood flow to the heart. Lower blood pressure. Reduce cholesterol. You may have a procedure called angioplasty to widen a narrowed or blocked artery. A small mesh tube called a stent may be put in the artery to keep it open. Surgery may be needed to allow blood to go around a blocked artery. Follow these instructions at home: Medicines Take your medicines only as told. Do not take these medicines unless your provider says that you can: NSAIDs, such as ibuprofen and naproxen. Supplements that contain vitamin A, vitamin E, or both. Hormone therapy that contains estrogen with or without progestin. Eating and drinking  Eat a healthy diet that includes: Lots of fresh fruits and vegetables. Whole grains. Low-fat protein. Low-fat dairy products. Follow instructions about what you may eat and drink. Activity Exercise as told. Talk with your provider about doing a program called cardiac rehab to help make your heart strong. When you feel tired, take a break. Plan breaks if you know you're going to feel tired. Lifestyle Do not smoke, vape, or use nicotine or tobacco. If your provider says you can drink alcohol: Limit how much you have to: 0-1 drink a day if you're female and not pregnant. 0-2 drinks a day if you're female. Know how much alcohol is in your drink. In the [place] one drink is one 12 oz bottle of beer (355 mL), one 5 oz glass of wine (148 mL), or one 1 oz glass of hard liquor (44 mL). General instructions  Stay at a healthy weight. If told to lose weight, work with your provider to lose weight safely. Keep your vaccines up to date. Get a flu shot every year. Learn to manage stress. If you need help, ask your provider. Talk with your provider if you feel depressed. Work with your provider to manage any other health problems that you have. These may include diabetes or high blood pressure. Keep all  follow-up visits. Your provider will want to check on your condition. Get help right away if: You have pain in your chest, neck, arm, jaw, or back, and the pain: Happens more often. Lasts more than a few minutes. Goes away and comes back. Does not get better after you take medicine under your tongue. You're dizzy or light-headed all of a sudden. You faint. You have any combination of these problems: Cold sweats. Heartburn or upset stomach. Trouble breathing. Feeling like you may throw up, or you throw up. Feeling very tired or weak. Feeling worried or nervous. These symptoms may be an emergency. Call 911 right away. Do not wait to see if the symptoms will go away. Do not drive yourself to the hospital. This information is not intended to replace advice given to you by your health care provider. Make sure you discuss any questions you have with your health care provider. Document Revised: 12/05/2022 Document Reviewed: 06/18/2022 Elsevier Patient Education  2024 [employer].

## 2023-08-17 NOTE — Telephone Encounter (Signed)
 Thats fine with me

## 2023-08-22 NOTE — Telephone Encounter (Unsigned)
 Copied from CRM [id number]. Topic: Clinical - Medication Question >> Aug 22, 2023  1:31 PM Antwanette L wrote: Reason for CRM: The patient is calling to get a refill on flexeril . The medicine is not listed on the patient chart Please contact the patient at [phone].

## 2023-08-23 ENCOUNTER — Other Ambulatory Visit (HOSPITAL_COMMUNITY): Payer: Self-pay

## 2023-08-23 ENCOUNTER — Encounter: Payer: Self-pay | Admitting: Family

## 2023-08-23 MED ORDER — CYCLOBENZAPRINE HCL 5 MG PO TABS
5.0000 mg | ORAL_TABLET | Freq: Three times a day (TID) | ORAL | 1 refills | Status: DC | PRN
  Filled 2023-08-23: qty 60, 20d supply, fill #0

## 2023-08-23 MED ORDER — CYCLOBENZAPRINE HCL 5 MG PO TABS: 5.0000 mg | ORAL_TABLET | Freq: Three times a day (TID) | ORAL | 1 refills | Status: DC | PRN

## 2023-08-23 NOTE — Telephone Encounter (Signed)
 Patient returned call advised rx sent to [facility].   Patient states prescription should be sent to [facility] [address] [place] [address]

## 2023-08-23 NOTE — Telephone Encounter (Signed)
 Attempted to call pt x2 phone just keeps ringing

## 2023-08-23 NOTE — Addendum Note (Signed)
 Addended by: MICHELINE ROSINA FALCON on: 08/23/2023 04:14 PM   Modules accepted: Orders

## 2023-08-23 NOTE — Telephone Encounter (Signed)
Sent to [facility] 

## 2023-08-23 NOTE — Telephone Encounter (Signed)
 Flexeril  Prescription sent to pharmacy, only take this when needed. Can cause sleepiness.

## 2023-08-24 ENCOUNTER — Ambulatory Visit: Payer: Self-pay

## 2023-08-24 ENCOUNTER — Telehealth: Payer: Self-pay | Admitting: Cardiology

## 2023-08-24 NOTE — Telephone Encounter (Signed)
 Patient states she has been having intermittent episodes of weakness and dizziness since 08/14/23. She also reports she is having episodes of intermittent chest pain since 08/08/23.   Patient reports these episodes occur separately, though sometimes together and mostly at rest. She states they last anywhere between 3-6 minutes.  Patient states she has taken NTG and it did help. Last dose was 08/15/23.  Patient states the Lopressor  she was prescribed in February made her feet swell and did not seem to help much, so she has not been taking this medication. She would like to know if there is a different medication she can take to help with the intermittent chest pain.  Patient also reports she is drinking approximately 80 oz of water and tea per day. BP has ranged 120's-140's/70's.  Reviewed ED precautions, patient verbalized understanding.  Will forward to Dr. Lavona to review and advise.  Patient has appt with Dr. Lavona on 8/27.

## 2023-08-24 NOTE — Telephone Encounter (Signed)
 FYI Only or Action Required?: Action required by provider: request for appointment.  Patient was last seen in primary care on 08/14/2023 by [name], FNP.  Called Nurse Triage reporting Chest Pain.  Symptoms began several days ago.  Interventions attempted: Nothing.  Symptoms are: unchanged. Chest pain that comes and goes x 10 days. Pain lasts 1-2 minutes, using her nitroglycerin  as needed. No pain now. Will go to ED if pain returns.  Triage Disposition: See Physician Within 24 Hours  Patient/caregiver understands and will follow disposition?: Yes    Copied from CRM [id number]. Topic: Clinical - Red Word Triage >> Aug 24, 2023  3:30 PM Montie POUR wrote: Red Word that prompted transfer to Nurse Triage:  Amy Black is having weakness and she has to lay down when she has these spells. She is also having chest pain and has to take nitroglycrein and a pain pill. Pain level is a 10. Daughter lives with her Reason for Disposition  [1] Chest pain lasts < 5 minutes AND [2] NO chest pain or cardiac symptoms (e.g., breathing difficulty, sweating) now  (Exception: Chest pains that last only a few seconds.)  Answer Assessment - Initial Assessment Questions 1. LOCATION: Where does it hurt?       Left side 2. RADIATION: Does the pain go anywhere else? (e.g., into neck, jaw, arms, back)     Neck, arm 3. ONSET: When did the chest pain begin? (Minutes, hours or days)      10 days ago 4. PATTERN: Does the pain come and go, or has it been constant since it started?  Does it get worse with exertion?      Comes and goes 5. DURATION: How long does it last (e.g., seconds, minutes, hours)     1-2 minutes 6. SEVERITY: How bad is the pain?  (e.g., Scale 1-10; mild, moderate, or severe)     10 7. CARDIAC RISK FACTORS: Do you have any history of heart problems or risk factors for heart disease? (e.g., angina, prior heart attack; diabetes, high blood pressure, high cholesterol, smoker, or  strong family history of heart disease)     angina 8. PULMONARY RISK FACTORS: Do you have any history of lung disease?  (e.g., blood clots in lung, asthma, emphysema, birth control pills)     no 9. CAUSE: What do you think is causing the chest pain?     unsure 10. OTHER SYMPTOMS: Do you have any other symptoms? (e.g., dizziness, nausea, vomiting, sweating, fever, difficulty breathing, cough)       SOB 11. PREGNANCY: Is there any chance you are pregnant? When was your last menstrual period?       no  Protocols used: Chest Pain-A-AH

## 2023-08-24 NOTE — Telephone Encounter (Signed)
 STAT if patient feels like he/she is going to faint   1. Are you feeling dizzy, lightheaded, or faint right now?   No - Comes and goes throughout the day  2. Have you passed out?  No (If yes move to .[facility]NCOPE[facility])  3. Do you have any other symptoms?   Chest pain (see below)  4. Have you checked your HR and BP (record if available)?   No   Patient stated she has been feeling weak and having dizzy spells since 7/8.  Patient stated her new medication is not working    Pt c/o of Chest Pain: STAT if active CP, including tightness, pressure, jaw pain, radiating pain to shoulder/upper arm/back, CP unrelieved by Nitro. Symptoms reported of SOB, nausea, vomiting, sweating.  1. Are you having CP right now?   No  2. Are you experiencing any other symptoms (ex. SOB, nausea, vomiting, sweating)?   Sweating   3. Is your CP continuous or coming and going?   Coming and going  4. Have you taken Nitroglycerin ?   Yes   5. How long have you been experiencing CP?  Started around 7/2    6. If NO CP at time of call then end call with telling Pt to call back or call 911 if Chest pain returns prior to return call from triage team.

## 2023-08-24 NOTE — Telephone Encounter (Signed)
 Noted

## 2023-08-24 NOTE — Telephone Encounter (Signed)
 Spoke with patient and shared response from Dr. Lavona:  She had a recent evaluation with no obstructive CAD. I would ask her to follow next with her PCP.   Patient verbalized understanding and expressed appreciation for follow-up.

## 2023-08-27 ENCOUNTER — Ambulatory Visit: Payer: Self-pay | Admitting: Nurse Practitioner

## 2023-08-27 ENCOUNTER — Ambulatory Visit: Admitting: Nurse Practitioner

## 2023-08-27 ENCOUNTER — Encounter: Payer: Self-pay | Admitting: Nurse Practitioner

## 2023-08-27 VITALS — BP 116/65 | HR 60 | Temp 97.3°F | Ht 63.0 in | Wt 146.4 lb

## 2023-08-27 DIAGNOSIS — R350 Frequency of micturition: Secondary | ICD-10-CM | POA: Diagnosis not present

## 2023-08-27 DIAGNOSIS — N3001 Acute cystitis with hematuria: Secondary | ICD-10-CM | POA: Insufficient documentation

## 2023-08-27 LAB — URINALYSIS, ROUTINE W REFLEX MICROSCOPIC
Bilirubin, UA: NEGATIVE
Glucose, UA: NEGATIVE
Ketones, UA: NEGATIVE
Nitrite, UA: NEGATIVE
Protein,UA: NEGATIVE
RBC, UA: NEGATIVE
Specific Gravity, UA: 1.015 (ref 1.005–1.030)
Urobilinogen, Ur: 0.2 mg/dL (ref 0.2–1.0)
pH, UA: 7.5 (ref 5.0–7.5)

## 2023-08-27 LAB — MICROSCOPIC EXAMINATION
RBC, Urine: NONE SEEN /HPF (ref 0–2)
Renal Epithel, UA: NONE SEEN /HPF
WBC, UA: NONE SEEN /HPF (ref 0–5)
Yeast, UA: NONE SEEN

## 2023-08-27 MED ORDER — SULFAMETHOXAZOLE-TRIMETHOPRIM 800-160 MG PO TABS: 1.0000 | ORAL_TABLET | Freq: Two times a day (BID) | ORAL | 0 refills | Status: AC

## 2023-08-27 NOTE — Progress Notes (Signed)
 Acute Office Visit  Subjective:     Patient ID: Amy Black, female    DOB: July 11, 1940, 83 y.o.   MRN: [id number]  Chief Complaint  Patient presents with   Fatigue    Symptoms for couple weeks comes and goes   Urinary Frequency    Symptoms for a week    HPI Amy Black is a 83 yrs old female presents 08/27/2023 fro an acute visit concerns fro UTI. The appointment was made fro chest however she reports she has not had any chest pain fro over 1-week now  chest pain is gone, but now I ma feeling weak and having urinary frequency and urgency. Reports right flank pain, denies fever chill, N/V,   Active Ambulatory Problems    Diagnosis Date Noted   Coronary atherosclerosis 12/03/2009   GERD 12/03/2009   Adenocarcinoma of unknown primary (HCC) 02/06/2011   HTN (hypertension) 08/29/2012   Arthritis of neck 08/29/2012   Transverse myelitis (HCC) 10/10/2012   Coronary artery spasm (HCC) 11/19/2012   Baker's cyst of knee 02/14/2013   Urinary retention 02/20/2012   Lower extremity weakness 02/20/2012   Thrombocytosis 07/23/2015   Palpitations 11/14/2018   IDA (iron  deficiency anemia) 05/07/2019   Herpes zoster dermatitis 02/16/2020   Other headache syndrome 07/03/2020   Fecal smearing 02/14/2021   Vitamin D  deficiency 11/11/2021   Seasonal allergic rhinitis 10/02/2022   Dizzy spells 01/01/2023   GAD (generalized anxiety disorder) 02/13/2023   First degree AV block 08/14/2023   Acute cystitis with hematuria 08/27/2023   Urinary frequency 08/27/2023   Resolved Ambulatory Problems    Diagnosis Date Noted   ADENOCARCINOMA 12/02/2009   ANGINA, UNSTABLE 12/02/2009   DIVERTICULOSIS, COLON 12/02/2009   Irritable bowel syndrome 12/02/2009   HEPATIC CYST 12/02/2009   Diarrhea 12/02/2009   Anxiety 05/19/2010   Chest pain 08/16/2010   Irritable bowel disease 08/16/2010   Dysphagia 08/21/2010   Gastritis 08/29/2010   Duodenal nodule 08/29/2010   Flaccid paralysis of legs  (HCC) 02/10/2012   Urinary retention 02/10/2012   Weakness of right leg 02/14/2012   Conjunctivitis 05/28/2012   CAD (coronary artery disease) 08/29/2012   HLD (hyperlipidemia) 08/29/2012   Pain in joint, ankle and foot 02/14/2013   Pedal edema 02/14/2013   Rhinitis 02/14/2013   Chest pain 11/28/2013   Exertional angina (HCC)    Abdominal pain 12/14/2014   Closed right hip fracture (HCC) 10/03/2015   Closed fracture of neck of right femur, initial encounter (HCC) 10/04/2015   Acute back pain 01/09/2019   Dizziness 01/09/2019   Subacute frontal sinusitis 08/19/2020   Weakness 01/03/2021   Callus 02/22/2023   Past Medical History:  Diagnosis Date   Adenocarcinoma (HCC)    Adenosquamous carcinoma    Allergy    Cataract    Diverticulosis of colon (without mention of hemorrhage)    Femoral neck fracture, right, closed, initial encounter 10/04/2015   GERD (gastroesophageal reflux disease)    History of nuclear stress test 04/2011   Hyperlipidemia    Insomnia    Vitamin D  deficiency     Review of Systems  Constitutional:  Negative for chills and fever.  HENT:  Negative for congestion and sore throat.   Respiratory:  Negative for cough, shortness of breath and wheezing.   Cardiovascular:  Negative for chest pain and leg swelling.  Genitourinary:  Positive for flank pain, frequency and urgency. Negative for dysuria and hematuria.  Skin:  Negative for itching and rash.  Neurological:  Negative for dizziness and headaches.   Negative unless indicated in HPI    Objective:    BP 116/65   Pulse 60   Temp (!) 97.3 F (36.3 C) (Temporal)   Ht 5' 3 (1.6 m)   Wt 146 lb 6.4 oz (66.4 kg)   SpO2 98%   BMI 25.93 kg/m  BP Readings from Last 3 Encounters:  08/27/23 116/65  08/14/23 (!) 148/70  06/28/23 (!) 141/79   Wt Readings from Last 3 Encounters:  08/27/23 146 lb 6.4 oz (66.4 kg)  08/14/23 149 lb 6.4 oz (67.8 kg)  06/28/23 148 lb 3.2 oz (67.2 kg)      Physical  Exam Vitals and nursing note reviewed.  Constitutional:      General: She is not in acute distress. HENT:     Head: Normocephalic and atraumatic.     Nose: Nose normal.     Mouth/Throat:     Mouth: Mucous membranes are moist.  Eyes:     Extraocular Movements: Extraocular movements intact.     Conjunctiva/sclera: Conjunctivae normal.     Pupils: Pupils are equal, round, and reactive to light.  Cardiovascular:     Heart sounds: Normal heart sounds.  Abdominal:     General: Bowel sounds are normal.     Palpations: Abdomen is soft.     Tenderness: There is right CVA tenderness.  Musculoskeletal:        General: Normal range of motion.     Right lower leg: No edema.     Left lower leg: No edema.  Skin:    General: Skin is warm and dry.     Findings: No rash.  Neurological:     Mental Status: She is alert and oriented to person, place, and time.  Psychiatric:        Mood and Affect: Mood normal.        Behavior: Behavior normal.        Thought Content: Thought content normal.        Judgment: Judgment normal.    Urine dipstick shows positive for leukocytes.   Micro exam: 0-10 RBC's per HPF, few+ bacteria, and epithelial cells 0-10  No results found for any visits on 08/27/23.      Assessment & Plan:  Urinary frequency -     Urinalysis, Routine w reflex microscopic -     Sulfamethoxazole -Trimethoprim ; Take 1 tablet by mouth 2 (two) times daily for 7 days.  Dispense: 14 tablet; Refill: 0 -     Urine Culture  Acute cystitis with hematuria -     Sulfamethoxazole -Trimethoprim ; Take 1 tablet by mouth 2 (two) times daily for 7 days.  Dispense: 14 tablet; Refill: 0 -     Urine Culture  Amy Black is a 83 yrs old african American female seen today for acute cystitis. Acute cystitis: bactrim  BID for 7-days, order culture, she understands based on culture results an new ATB may be needed.  The above assessment and management plan was discussed with the patient. The patient  verbalized understanding of and has agreed to the management plan. Patient is aware to call the clinic if they develop any new symptoms or if symptoms persist or worsen. Patient is aware when to return to the clinic for a follow-up visit. Patient educated on when it is appropriate to go to the emergency department.  Return if symptoms worsen or fail to improve.  Mylan Lengyel [place], DNP [facility] [address] [place],  [place] [address] [phone]  Note: This document was prepared by Dragon voice dictation technology and any errors that results from this process are unintentional.

## 2023-08-29 LAB — URINE CULTURE

## 2023-09-13 ENCOUNTER — Encounter: Payer: Self-pay | Admitting: Medical Oncology

## 2023-09-13 ENCOUNTER — Inpatient Hospital Stay (HOSPITAL_BASED_OUTPATIENT_CLINIC_OR_DEPARTMENT_OTHER): Admitting: Medical Oncology

## 2023-09-13 ENCOUNTER — Ambulatory Visit: Payer: Self-pay | Admitting: Medical Oncology

## 2023-09-13 ENCOUNTER — Inpatient Hospital Stay: Attending: Hematology & Oncology

## 2023-09-13 ENCOUNTER — Other Ambulatory Visit: Payer: Self-pay | Admitting: Medical Oncology

## 2023-09-13 VITALS — BP 112/57 | HR 70 | Temp 97.7°F | Resp 18 | Ht 63.0 in | Wt 150.0 lb

## 2023-09-13 DIAGNOSIS — R634 Abnormal weight loss: Secondary | ICD-10-CM

## 2023-09-13 DIAGNOSIS — D649 Anemia, unspecified: Secondary | ICD-10-CM

## 2023-09-13 DIAGNOSIS — E639 Nutritional deficiency, unspecified: Secondary | ICD-10-CM | POA: Diagnosis not present

## 2023-09-13 DIAGNOSIS — D500 Iron deficiency anemia secondary to blood loss (chronic): Secondary | ICD-10-CM

## 2023-09-13 DIAGNOSIS — C801 Malignant (primary) neoplasm, unspecified: Secondary | ICD-10-CM

## 2023-09-13 DIAGNOSIS — Z859 Personal history of malignant neoplasm, unspecified: Secondary | ICD-10-CM | POA: Diagnosis not present

## 2023-09-13 DIAGNOSIS — R5383 Other fatigue: Secondary | ICD-10-CM

## 2023-09-13 DIAGNOSIS — D75839 Thrombocytosis, unspecified: Secondary | ICD-10-CM

## 2023-09-13 DIAGNOSIS — D509 Iron deficiency anemia, unspecified: Secondary | ICD-10-CM | POA: Diagnosis not present

## 2023-09-13 DIAGNOSIS — Z79899 Other long term (current) drug therapy: Secondary | ICD-10-CM | POA: Diagnosis not present

## 2023-09-13 DIAGNOSIS — R61 Generalized hyperhidrosis: Secondary | ICD-10-CM

## 2023-09-13 LAB — CMP (CANCER CENTER ONLY)
ALT: 12 U/L (ref 0–44)
AST: 18 U/L (ref 15–41)
Albumin: 4.2 g/dL (ref 3.5–5.0)
Alkaline Phosphatase: 80 U/L (ref 38–126)
Anion gap: 9 (ref 5–15)
BUN: 11 mg/dL (ref 8–23)
CO2: 27 mmol/L (ref 22–32)
Calcium: 9.4 mg/dL (ref 8.9–10.3)
Chloride: 98 mmol/L (ref 98–111)
Creatinine: 0.96 mg/dL (ref 0.44–1.00)
GFR, Estimated: 60 mL/min — ABNORMAL LOW (ref >60)
Glucose, Bld: 87 mg/dL (ref 70–99)
Potassium: 4.9 mmol/L (ref 3.5–5.1)
Sodium: 133 mmol/L — ABNORMAL LOW (ref 135–145)
Total Bilirubin: 0.3 mg/dL (ref 0.0–1.2)
Total Protein: 6.9 g/dL (ref 6.5–8.1)

## 2023-09-13 LAB — CBC WITH DIFFERENTIAL (CANCER CENTER ONLY)
Abs Immature Granulocytes: 0.01 K/uL (ref 0.00–0.07)
Basophils Absolute: 0.1 K/uL (ref 0.0–0.1)
Basophils Relative: 1 %
Eosinophils Absolute: 0.1 K/uL (ref 0.0–0.5)
Eosinophils Relative: 3 %
HCT: 32.8 % — ABNORMAL LOW (ref 36.0–46.0)
Hemoglobin: 10.9 g/dL — ABNORMAL LOW (ref 12.0–15.0)
Immature Granulocytes: 0 %
Lymphocytes Relative: 35 %
Lymphs Abs: 2 K/uL (ref 0.7–4.0)
MCH: 31.4 pg (ref 26.0–34.0)
MCHC: 33.2 g/dL (ref 30.0–36.0)
MCV: 94.5 fL (ref 80.0–100.0)
Monocytes Absolute: 0.6 K/uL (ref 0.1–1.0)
Monocytes Relative: 10 %
Neutro Abs: 2.9 K/uL (ref 1.7–7.7)
Neutrophils Relative %: 51 %
Platelet Count: 362 K/uL (ref 150–400)
RBC: 3.47 MIL/uL — ABNORMAL LOW (ref 3.87–5.11)
RDW: 13.2 % (ref 11.5–15.5)
WBC Count: 5.7 K/uL (ref 4.0–10.5)
nRBC: 0 % (ref 0.0–0.2)

## 2023-09-13 LAB — FERRITIN: Ferritin: 204 ng/mL (ref 11–307)

## 2023-09-13 LAB — IRON AND IRON BINDING CAPACITY (CC-WL,HP ONLY)
Iron: 92 ug/dL (ref 28–170)
Saturation Ratios: 38 % — ABNORMAL HIGH (ref 10.4–31.8)
TIBC: 239 ug/dL — ABNORMAL LOW (ref 250–450)
UIBC: 147 ug/dL

## 2023-09-13 LAB — VITAMIN B12: Vitamin B-12: 725 pg/mL (ref 180–914)

## 2023-09-13 LAB — FOLATE: Folate: >40 ng/mL (ref >5.9)

## 2023-09-13 NOTE — Progress Notes (Signed)
 Hematology and Oncology Follow Up Visit  Amy Black [id number] [birth year] 83 y.o. 09/13/2023   Principle Diagnosis:  Transient thrombocytosis History transverse myelitis History of adenosquamous carcinoma of unknown primary Iron  def anemia   Current Therapy:        Fusion Plus -- daily   Interim History:  Amy Black is here today with her son for follow-up  At our last visit on 08/17/2022 we discussed CT imaging given her symptoms of abdominal pain, unintentional weight loss, etc. This was ordered but has not been performed. She also was going to discuss her fatigue and dizziness with her cardiologist which she has done.  She reports that she took her premeds I gave her at her last visit for a cardiac scan she had done with contrast. She tolerated this well and didn't have any trouble with the IV contrast or premeds.   Today she reports that she is feeling well. No changes of her health to report. She has no concerns or complaints.  No falls or syncope reported.  No swelling, tenderness, numbness or tingling in her extremities.  No fever, chills, n/v, cough, rash, dizziness, SOB, chest pain, palpitations, abdominal pain or changes in bowel or bladder habits.  She declines BP recheck today- states that she had not taken her BP medications yet today but will when she gets home.  She does have night sweats  Appetite and hydration are good.   Wt Readings from Last 3 Encounters:  09/13/23 150 lb (68 kg)  08/27/23 146 lb 6.4 oz (66.4 kg)  08/14/23 149 lb 6.4 oz (67.8 kg)   ECOG Performance Status: 1 - Symptomatic but completely ambulatory  Medications:  Allergies as of 09/13/2023       Reactions   Iodine Itching   Ivp Dye [iodinated Contrast Media] Itching   Amlodipine  Other (See Comments)   headaches   Lexapro  [escitalopram  Oxalate] Other (See Comments)   Drunk, High feeling   Tizanidine  Other (See Comments)   Other reaction(s):Drunk feeling the next day       Cymbalta   [duloxetine  Hcl] Other (See Comments)   sleepiness   Duloxetine  Other (See Comments)   Other reaction(s): sleepy   Gabapentin  Other (See Comments)   sleepiness   Zanaflex  [tizanidine  Hcl] Other (See Comments)   Very drunk feeling next day        Medication List        Accurate as of September 13, 2023  2:34 PM. If you have any questions, ask your nurse or doctor.          ALPRAZolam  0.25 MG tablet Commonly known as: XANAX  Take 1 tablet (0.25 mg total) by mouth 2 (two) times daily as needed for anxiety.   cetirizine  10 MG tablet Commonly known as: ZYRTEC  TAKE ONE TABLET BY MOUTH DAILY   cyclobenzaprine  5 MG tablet Commonly known as: FLEXERIL  Take 1 tablet (5 mg total) by mouth 3 (three) times daily as needed for muscle spasms.   diphenhydrAMINE  50 MG tablet Commonly known as: BENADRYL  Take 1 tablet (50 mg total) by mouth once for 1 dose. Take the morning of CT   Fusion Plus Caps Take 1 capsule by mouth daily at 6 (six) AM.   hydrOXYzine  10 MG tablet Commonly known as: ATARAX  Take 1 tablet (10 mg total) by mouth 2 (two) times daily.   Hyoscyamine  Sulfate SL 0.125 MG Subl Commonly known as: Levsin /SL Place 0.125 mg under the tongue every 4 (four) hours as needed (abd cramping  and diarrhea).   isosorbide  mononitrate 120 MG 24 hr tablet Commonly known as: IMDUR  Take 1 tablet (120 mg total) by mouth daily.   metoprolol  tartrate 25 MG tablet Commonly known as: LOPRESSOR  Take 1 tablet (25 mg total) by mouth 2 (two) times daily.   mirtazapine  15 MG tablet Commonly known as: REMERON  Take 1 tablet (15 mg total) by mouth at bedtime.   nitroGLYCERIN  0.4 MG SL tablet Commonly known as: NITROSTAT  DISSOLVE ONE TABLET UNDER TONGUE EVERY 5 MINUTES UP TO 3 DOSES AS NEEDED FOR CHEST PAIN   omeprazole  20 MG capsule Commonly known as: PRILOSEC Take 1 capsule (20 mg total) by mouth daily.   traMADol  50 MG tablet Commonly known as: ULTRAM  Take 1 tablet (50 mg total) by  mouth every 12 (twelve) hours as needed. TAKE 1 TABLET EVERY 12 HOURS AS NEEDED   triamterene -hydrochlorothiazide  37.5-25 MG capsule Commonly known as: DYAZIDE  TAKE ONE CAPSULE BY MOUTH 3 TIMES PER WEEK   verapamil  180 MG CR tablet Commonly known as: CALAN -SR Take 1 tablet (180 mg total) by mouth at bedtime.        Allergies:  Allergies  Allergen Reactions   Iodine Itching   Ivp Dye [Iodinated Contrast Media] Itching   Amlodipine  Other (See Comments)    headaches   Lexapro  [Escitalopram  Oxalate] Other (See Comments)    Drunk, High feeling   Tizanidine  Other (See Comments)    Other reaction(s):Drunk feeling the next day       Cymbalta  [Duloxetine  Hcl] Other (See Comments)    sleepiness   Duloxetine  Other (See Comments)    Other reaction(s): sleepy   Gabapentin  Other (See Comments)    sleepiness   Zanaflex  [Tizanidine  Hcl] Other (See Comments)    Very drunk feeling next day    Past Medical History, Surgical history, Social history, and Family History were reviewed and updated.  Review of Systems: All other 10 point review of systems is negative.   Physical Exam:  height is 5' 3 (1.6 m) and weight is 150 lb (68 kg). Her oral temperature is 97.7 F (36.5 C). Her blood pressure is 112/57 (abnormal) and her pulse is 70. Her respiration is 18 and oxygen saturation is 100%.   Wt Readings from Last 3 Encounters:  09/13/23 150 lb (68 kg)  08/27/23 146 lb 6.4 oz (66.4 kg)  08/14/23 149 lb 6.4 oz (67.8 kg)   Constitutional: AA X 3 Ocular: Sclerae unicteric, pupils equal, round and reactive to light Ear-nose-throat: Oropharynx clear, dentition fair Lymphatic: No cervical or supraclavicular adenopathy Lungs no rales or rhonchi, good excursion bilaterally Heart regular rate and rhythm, no murmur appreciated Abd soft MSK no focal spinal tenderness, no joint edema Neuro: non-focal, well-oriented, appropriate affect Skin: No rash  Lab Results  Component Value Date   WBC  5.7 09/13/2023   HGB 10.9 (L) 09/13/2023   HCT 32.8 (L) 09/13/2023   MCV 94.5 09/13/2023   PLT 362 09/13/2023   Lab Results  Component Value Date   FERRITIN 305 06/13/2023   IRON  89 06/13/2023   TIBC 262 06/13/2023   UIBC 173 06/13/2023   IRONPCTSAT 34 (H) 06/13/2023   Lab Results  Component Value Date   RETICCTPCT 1.7 06/13/2023   RBC 3.47 (L) 09/13/2023   No results found for: KPAFRELGTCHN, LAMBDASER, KAPLAMBRATIO No results found for: IGGSERUM, IGA, IGMSERUM No results found for: TOTALPROTELP, ALBUMINELP, A1GS, A2GS, BETS, BETA2SER, GAMS, MSPIKE, SPEI   Chemistry      Component Value Date/Time  NA 136 06/13/2023 1338   NA 136 05/15/2023 1434   NA 138 01/28/2016 1303   K 4.2 06/13/2023 1338   K 4.2 07/28/2016 1243   K 4.2 01/28/2016 1303   CL 100 06/13/2023 1338   CL 98 07/28/2016 1243   CL 101 07/20/2014 1131   CO2 31 06/13/2023 1338   CO2 29 07/28/2016 1243   CO2 30 (H) 01/28/2016 1303   BUN 17 06/13/2023 1338   BUN 13 05/15/2023 1434   BUN 10.0 01/28/2016 1303   CREATININE 0.85 06/13/2023 1338   CREATININE 0.94 07/28/2016 1243   CREATININE 0.8 01/28/2016 1303      Component Value Date/Time   CALCIUM  10.1 06/13/2023 1338   CALCIUM  9.9 07/28/2016 1243   CALCIUM  10.0 01/28/2016 1303   ALKPHOS 68 06/13/2023 1338   ALKPHOS 105 07/28/2016 1243   ALKPHOS 123 01/28/2016 1303   AST 15 06/13/2023 1338   AST 13 01/28/2016 1303   ALT 12 06/13/2023 1338   ALT 12 01/28/2016 1303   BILITOT 0.3 06/13/2023 1338   BILITOT 0.51 01/28/2016 1303     Encounter Diagnoses  Name Primary?   Iron  deficiency anemia due to chronic blood loss Yes   Adenocarcinoma of unknown primary (HCC)    Thrombocytosis     Impression and Plan: Ms. Champagne is a very pleasant 83yo African American female with history of transient thrombocytosis and also history of adenosquamous carcinoma with unknown primary.   CT scan from 03/08/2023 fortunately did not show  any evidence of metastatic disease of the chest, abdomen or pelvis.   CBC shows a Hgb of 10.9 which is stable, platelet count of 362 Iron  studies and B12/folate pending  RTC 3 months APP, labs (CBC w/, CMP, iron , ferritin, B12, folate-Coos   Lauraine CHRISTELLA Dais, PA-C 8/7/20252:34 PM

## 2023-09-17 ENCOUNTER — Encounter: Payer: Self-pay | Admitting: Family

## 2023-09-17 NOTE — Telephone Encounter (Signed)
 Called patient to discuss the following message regarding her lab results. Patient denies currently taking any oral iron  supplementation. Patient instructed we would continue to monitor her iron  levels and to call us  should needs arise. Patient verbalized understanding.

## 2023-09-17 NOTE — Telephone Encounter (Signed)
-----   Message from Lauraine CHRISTELLA Dais sent at 09/17/2023  2:20 PM EDT ----- B12 looks good Iron  is a bit elevated. Have her stop any iron  supplementation she is taking. We will keep an eye on her iron  levels  ----- Message ----- From: Interface, Lab In [employer] Sent: 09/13/2023   2:18 PM EDT To: Lauraine CHRISTELLA Dais, PA-C

## 2023-10-02 DIAGNOSIS — I471 Supraventricular tachycardia, unspecified: Secondary | ICD-10-CM | POA: Insufficient documentation

## 2023-10-02 DIAGNOSIS — R072 Precordial pain: Secondary | ICD-10-CM | POA: Insufficient documentation

## 2023-10-02 NOTE — Progress Notes (Unsigned)
 Cardiology Office Note:   Date:  10/03/2023  ID:  Thao Bauza Binz, DOB Dec 15, 1940, MRN [id number] PCP: [name], FNP  [facility] Providers Cardiologist:  Lynwood Schilling, MD {  History of Present Illness:   Amy Black is a 83 y.o. female with PMH of minimal CAD, coronary artery spasm, palpitations/PSVT, hypertension, chronic fatigue, iron  deficiency anemia, adenosquamous carcinoma, transverse myelitis, IBS, anxiety and GERD.  Cardiac catheterization in July 2016 showed essentially normal coronary arteries other than 20% disease in the LAD.  Her chest pain was presumed to be coronary vasospasm and she was treated with Imdur .  She experienced improvement in her symptom with transition from verapamil  to diltiazem .  Heart monitor in May 2022 showed minimal heart rate of 53, maximal heart rate of 169, average heart rate 80 bpm.  30 episode of SVT with fastest interval lasting 5 beats with a maximal heart rate of 169, longest lasting 6 beats with average heart rate of 130 bpm, less than 1% PVC burden.  The SVT episodes did not correlate with any symptoms.  She is followed by hematology/oncology service for history of iron  deficiency anemia and adenosquamous carcinoma.  Myoview  in July 2024 showed EF 55 to 65%, normal perfusion, no ischemia or infarction.    She presents for follow up.  She had a coronary CTA earlier this year and was found to have nonobstructive coronary disease.  She does occasionally still get some chest discomfort.  In fact she had some last week and actually took 3 nitroglycerin .  She said it was mild.  It is an aching discomfort left of her sternum.  It only happens when she is resting and not when she is busy cooking or taking care of her husband who has dementia.  She has not had any discomfort in the last week.  She does not describe associated nausea vomiting or diaphoresis.  She might have some palpitations that may or may not be associated.  She is not having  any new shortness of breath, PND or orthopnea.  She uses a cane for balance.  ROS: As stated in the HPI and negative for all other systems.  Studies Reviewed:    EKG:   EKG Interpretation Date/Time:  Wednesday October 03 2023 13:08:07 EDT Ventricular Rate:  79 PR Interval:  190 QRS Duration:  78 QT Interval:  384 QTC Calculation: 440 R Axis:   -11  Text Interpretation: Sinus rhythm with Premature supraventricular complexes Possible Lateral infarct , age undetermined When compared with ECG of 06-Apr-2023 15:26, ectopy is new Confirmed by Schilling Lynwood ([id number]) on 10/03/2023 1:17:13 PM    Risk Assessment/Calculations:      Physical Exam:   VS:  BP (!) 140/70   Pulse 80   Ht 5' 3 (1.6 m)   Wt 152 lb (68.9 kg)   BMI 26.93 kg/m    Wt Readings from Last 3 Encounters:  10/03/23 152 lb (68.9 kg)  09/13/23 150 lb (68 kg)  08/27/23 146 lb 6.4 oz (66.4 kg)     GEN: Well nourished, well developed in no acute distress NECK: No JVD; No carotid bruits CARDIAC: RRR, no murmurs, rubs, gallops RESPIRATORY:  Clear to auscultation without rales, wheezing or rhonchi  ABDOMEN: Soft, non-tender, non-distended EXTREMITIES:  No edema; No deformity   ASSESSMENT AND PLAN:    Chest Pain: Her chest pain is predominantly nonanginal characteristics and she had nonobstructive coronary disease.  At this point no change in therapy.  She will  continue with risk reduction.  It has been assumed that she might be having some coronary spasm.  I could increase her Imdur  if she has more this going forward.   Hypertension: Her blood pressure is typically well-controlled.  Today's mildly elevated blood pressure is an aberration.  She will continue the meds as listed.  SVT: She has not had any symptomatic recurrence documented.  No change in therapy.   Follow up with Hao Meng, PA-C in 6 months  Signed, Lynwood Schilling, MD

## 2023-10-03 ENCOUNTER — Encounter: Payer: Self-pay | Admitting: Cardiology

## 2023-10-03 ENCOUNTER — Ambulatory Visit: Payer: Self-pay

## 2023-10-03 ENCOUNTER — Ambulatory Visit (INDEPENDENT_AMBULATORY_CARE_PROVIDER_SITE_OTHER): Admitting: Cardiology

## 2023-10-03 VITALS — BP 140/70 | HR 80 | Ht 63.0 in | Wt 152.0 lb

## 2023-10-03 DIAGNOSIS — R079 Chest pain, unspecified: Secondary | ICD-10-CM | POA: Diagnosis not present

## 2023-10-03 DIAGNOSIS — R072 Precordial pain: Secondary | ICD-10-CM

## 2023-10-03 DIAGNOSIS — I471 Supraventricular tachycardia, unspecified: Secondary | ICD-10-CM | POA: Diagnosis not present

## 2023-10-03 DIAGNOSIS — I10 Essential (primary) hypertension: Secondary | ICD-10-CM

## 2023-10-03 NOTE — Telephone Encounter (Signed)
 Noted

## 2023-10-03 NOTE — Telephone Encounter (Signed)
 FYI Only or Action Required?: FYI only for provider.  Patient was last seen in primary care on 08/27/2023 by Deitra Morton Sebastian Nena, NP.  Called Nurse Triage reporting Abdominal Pain.  Symptoms began today.  Symptoms are: unchanged.  Triage Disposition: Go to ED Now (Notify PCP)  Patient/caregiver understands and will follow disposition?: Unsure  Patient advised to go to the ED. Patient states I'll call someone to see if they can help and ended the call.       Copied from CRM [id number]. Topic: Clinical - Red Word Triage >> Oct 03, 2023  4:59 PM Turkey B wrote: [facility] that prompted transfer to Nurse Triage: patient has severe stomach pain, prolesec isnt helping         Reason for Disposition  [1] SEVERE pain (e.g., excruciating) AND [2] present > 1 hour  Answer Assessment - Initial Assessment Questions 1. LOCATION: Where does it hurt?      Middle of abdomen  2. RADIATION: Does the pain shoot anywhere else? (e.g., chest, back)     No 3. ONSET: When did the pain begin? (e.g., minutes, hours or days ago)      2-3 hours ago  4. SUDDEN: Gradual or sudden onset?     Sudden  5. PATTERN Does the pain come and go, or is it constant?     Constant  6. SEVERITY: How bad is the pain?  (e.g., Scale 1-10; mild, moderate, or severe)     Moderate to severe  7. RECURRENT SYMPTOM: Have you ever had this type of stomach pain before? If Yes, ask: When was the last time? and What happened that time?      Yes, usually takes Prilosec but it is not helping  8. CAUSE: What do you think is causing the stomach pain? (e.g., gallstones, recent abdominal surgery)     Started after eating  9. RELIEVING/AGGRAVATING FACTORS: What makes it better or worse? (e.g., antacids, bending or twisting motion, bowel movement)     No 10. OTHER SYMPTOMS: Do you have any other symptoms? (e.g., back pain, diarrhea, fever, urination pain, vomiting)       Vomiting  Protocols used:  Abdominal Pain - Female-A-AH

## 2023-10-03 NOTE — Patient Instructions (Signed)
 Medication Instructions:  Continue all current medications.   Labwork: none  Testing/Procedures: none  Follow-Up: 6 months   Any Other Special Instructions Will Be Listed Below (If Applicable).   If you need a refill on your cardiac medications before your next appointment, please call your pharmacy.

## 2023-10-04 ENCOUNTER — Telehealth: Payer: Self-pay | Admitting: Family Medicine

## 2023-10-04 MED ORDER — OMEPRAZOLE 40 MG PO CPDR
40.0000 mg | DELAYED_RELEASE_CAPSULE | Freq: Every day | ORAL | 1 refills | Status: AC
Start: 2023-10-04 — End: ?

## 2023-10-04 NOTE — Telephone Encounter (Signed)
 Copied from CRM #[id number]. Topic: Clinical - Medication Question >> Oct 04, 2023  9:55 AM Montie POUR wrote: Reason for CRM:  Raylan is calling to let FNP Lavell know that omeprazole  (PRILOSEC) 20 MG capsule is not working and she would like another medication called in for her. She uses [facility]. Please call her with questions at [phone].

## 2023-10-04 NOTE — Telephone Encounter (Signed)
 Omeprazole  increased to 40 mg from 20 mg. Prescription sent to pharmacy

## 2023-10-04 NOTE — Telephone Encounter (Signed)
 Patient aware and verbalized understanding.

## 2023-10-15 ENCOUNTER — Ambulatory Visit: Payer: Self-pay

## 2023-10-15 NOTE — Telephone Encounter (Signed)
 FYI Only or Action Required?: FYI only for provider.  Patient was last seen in primary care on 08/27/2023 by Deitra Morton Sebastian Nena, NP.  Called Nurse Triage reporting Neck Pain and Headache.  Symptoms began several days ago.  Interventions attempted: Nothing.  Symptoms are: unchanged.  Triage Disposition: See Physician Within 24 Hours  Patient/caregiver understands and will follow disposition?: Yes       Copied from CRM [id number]. Topic: Clinical - Red Word Triage >> Oct 15, 2023 11:41 AM Antwanette L wrote: Red Word that prompted transfer to Nurse Triage: Pt is experiencing pain from her neck to her head(on the left side) Reason for Disposition  [1] MODERATE headache (e.g., interferes with normal activities) AND [2] present > 24 hours AND [3] unexplained  (Exceptions: Pain medicines not tried, typical migraine, or headache part of viral illness.)  Answer Assessment - Initial Assessment Questions 1. ONSET: When did the pain begin?      Last night 2. LOCATION: Where does it hurt?      L neck and radiates to head 3. PATTERN Does the pain come and go, or has it been constant since it started?      Comes and goes - last a few seconds  4. SEVERITY: How bad is the pain?  (Scale 0-10; or none or slight stiffness, mild, moderate, severe)     real quick and sharp 5. RADIATION: Does the pain go anywhere else, shoot into your arms?     Shoots into heads 6. CORD SYMPTOMS: Any weakness or numbness of the arms or legs?     denies 7. CAUSE: What do you think is causing the neck pain?     unknown 8. NECK OVERUSE: Any recent activities that involved turning or twisting the neck?     denies 9. OTHER SYMPTOMS: Do you have any other symptoms? (e.g., headache, fever, chest pain, difficulty breathing, neck swelling)     Headache  10. PREGNANCY: Is there any chance you are pregnant? When was your last menstrual period?       N/a  Protocols used: Neck Pain or  Stiffness-A-AH, Headache-A-AH

## 2023-10-15 NOTE — Telephone Encounter (Signed)
 Appointment made

## 2023-10-16 ENCOUNTER — Ambulatory Visit (INDEPENDENT_AMBULATORY_CARE_PROVIDER_SITE_OTHER)

## 2023-10-16 ENCOUNTER — Ambulatory Visit (INDEPENDENT_AMBULATORY_CARE_PROVIDER_SITE_OTHER): Admitting: Family

## 2023-10-16 ENCOUNTER — Encounter: Payer: Self-pay | Admitting: Family

## 2023-10-16 VITALS — BP 128/77 | HR 89 | Temp 97.3°F | Ht 63.0 in | Wt 149.2 lb

## 2023-10-16 DIAGNOSIS — M542 Cervicalgia: Secondary | ICD-10-CM | POA: Diagnosis not present

## 2023-10-16 DIAGNOSIS — M170 Bilateral primary osteoarthritis of knee: Secondary | ICD-10-CM

## 2023-10-16 DIAGNOSIS — M50822 Other cervical disc disorders at C5-C6 level: Secondary | ICD-10-CM | POA: Diagnosis not present

## 2023-10-16 DIAGNOSIS — M47812 Spondylosis without myelopathy or radiculopathy, cervical region: Secondary | ICD-10-CM | POA: Diagnosis not present

## 2023-10-16 DIAGNOSIS — M4802 Spinal stenosis, cervical region: Secondary | ICD-10-CM | POA: Diagnosis not present

## 2023-10-16 DIAGNOSIS — G373 Acute transverse myelitis in demyelinating disease of central nervous system: Secondary | ICD-10-CM

## 2023-10-16 MED ORDER — NAPROXEN 500 MG PO TABS: 500.0000 mg | ORAL_TABLET | Freq: Two times a day (BID) | ORAL | 0 refills | Status: DC

## 2023-10-16 MED ORDER — TRAMADOL HCL 50 MG PO TABS: 50.0000 mg | ORAL_TABLET | Freq: Two times a day (BID) | ORAL | 1 refills | Status: DC | PRN

## 2023-10-16 NOTE — Progress Notes (Signed)
 Subjective:    Patient ID: Amy Black, female    DOB: October 23, 1940, 83 y.o.   MRN: [id number]  Chief Complaint  Patient presents with   sharp pain in head    PT presents to the office today with neck pain that started 3 days ago. Denies any injury.   Requesting her Ultram  be refilled.  Neck Pain  This is a new problem. The current episode started in the past 7 days. The problem occurs intermittently. The problem has been waxing and waning. The pain is associated with nothing. The pain is present in the left side. The quality of the pain is described as aching and shooting. The pain is at a severity of 10/10 (when it comes it is a 10). The pain is moderate. Nothing aggravates the symptoms. Pertinent negatives include no fever, headaches, leg pain, numbness, pain with swallowing, paresis or weakness. She has tried nothing for the symptoms. The treatment provided no relief.  Arthritis Presents for follow-up visit. She complains of pain and stiffness. The symptoms have been stable. Affected locations include the left knee and right knee. Her pain is at a severity of 8/10. Pertinent negatives include no fever.      Review of Systems  Constitutional:  Negative for fever.  Musculoskeletal:  Positive for arthritis, neck pain and stiffness.  Neurological:  Negative for weakness, numbness and headaches.  All other systems reviewed and are negative.   Social History   Socioeconomic History   Marital status: Married    Spouse name: Amy Black   Number of children: 2   Years of education: 12   Highest education level: High school graduate  Occupational History   Occupation: Disabilty   Occupation: DISABILITY    Employer: UNEMPLOYED  Tobacco Use   Smoking status: Former    Current packs/day: 0.00    Average packs/day: 0.5 packs/day for 4.0 years (2.0 ttl pk-yrs)    Types: Cigarettes    Start date: 12/07/1968    Quit date: 11/20/1972    Years since quitting: 50.9   Smokeless tobacco:  Never   Tobacco comments:    quit 40 years ago  Vaping Use   Vaping status: Never Used  Substance and Sexual Activity   Alcohol use: No    Alcohol/week: 0.0 standard drinks of alcohol   Drug use: No   Sexual activity: Not Currently    Birth control/protection: Surgical  Other Topics Concern   Not on file  Social History Narrative   Disabled, lives with husband and daughter.  Enjoys shopping. Lives in two story home   1 son and 1 daughter.   1 grandson.   Caffeine 2 cans of soda and tea   Right handed   Social Drivers of Health   Financial Resource Strain: Low Risk  (03/22/2022)   Overall Financial Resource Strain (CARDIA)    Difficulty of Paying Living Expenses: Not hard at all  Food Insecurity: No Food Insecurity (03/22/2022)   Hunger Vital Sign    Worried About Running Out of Food in the Last Year: Never true    Ran Out of Food in the Last Year: Never true  Transportation Needs: No Transportation Needs (03/22/2022)   PRAPARE - Administrator, Civil Service (Medical): No    Lack of Transportation (Non-Medical): No  Physical Activity: Inactive (03/22/2022)   Exercise Vital Sign    Days of Exercise per Week: 0 days    Minutes of Exercise per Session: 0 min  Stress: No Stress Concern Present (03/22/2022)   [employer] of Occupational Health - Occupational Stress Questionnaire    Feeling of Stress : Not at all  Social Connections: Moderately Integrated (03/22/2022)   Social Connection and Isolation Panel    Frequency of Communication with Friends and Family: More than three times a week    Frequency of Social Gatherings with Friends and Family: More than three times a week    Attends Religious Services: More than 4 times per year    Active Member of [employer] or Organizations: No    Attends Engineer, structural: Never    Marital Status: Married   Family History  Problem Relation Age of Onset   Bladder Cancer Mother    Heart disease Mother     Hypertension Mother    Heart disease Father    Stroke Father    Hyperlipidemia Sister    Depression Sister    Diabetes Brother    Seizures Brother    Hypertension Brother    Diabetes Other        Multiple family members on both sides    Coronary artery disease Other        Multiple family members on both sides    Colon cancer Neg Hx    Esophageal cancer Neg Hx    Pancreatic cancer Neg Hx    Rectal cancer Neg Hx    Stomach cancer Neg Hx    Breast cancer Neg Hx         Objective:   Physical Exam Vitals reviewed.  Constitutional:      General: She is not in acute distress.    Appearance: She is well-developed.  HENT:     Head: Normocephalic and atraumatic.     Right Ear: Tympanic membrane normal.     Left Ear: Tympanic membrane normal.  Eyes:     Pupils: Pupils are equal, round, and reactive to light.  Neck:     Thyroid : No thyromegaly.  Cardiovascular:     Rate and Rhythm: Normal rate and regular rhythm.     Heart sounds: Normal heart sounds. No murmur heard. Pulmonary:     Effort: Pulmonary effort is normal. No respiratory distress.     Breath sounds: Normal breath sounds. No wheezing.  Abdominal:     General: Bowel sounds are normal. There is no distension.     Palpations: Abdomen is soft.     Tenderness: There is no abdominal tenderness.  Musculoskeletal:        General: Tenderness present.     Cervical back: Normal range of motion and neck supple.     Comments: Pain in left neck with palpation, slight muscle tension. Full ROM of motion  Skin:    General: Skin is warm and dry.  Neurological:     Mental Status: She is alert and oriented to person, place, and time.     Cranial Nerves: No cranial nerve deficit.     Motor: Weakness present.     Deep Tendon Reflexes: Reflexes are normal and symmetric.  Psychiatric:        Behavior: Behavior normal.        Thought Content: Thought content normal.        Judgment: Judgment normal.       BP 128/77   Pulse  89   Temp (!) 97.3 F (36.3 C) (Temporal)   Ht 5' 3 (1.6 m)   Wt 149 lb 3.2 oz (67.7 kg)   BMI  26.43 kg/m      Assessment & Plan:  Amy Black comes in today with chief complaint of sharp pain in head    Diagnosis and orders addressed:  1. Neck pain (Primary) - DG Cervical Spine Complete; Future - traMADol  (ULTRAM ) 50 MG tablet; Take 1 tablet (50 mg total) by mouth every 12 (twelve) hours as needed. TAKE 1 TABLET EVERY 12 HOURS AS NEEDED  Dispense: 20 tablet; Refill: 1 - naproxen  (NAPROSYN ) 500 MG tablet; Take 1 tablet (500 mg total) by mouth 2 (two) times daily with a meal.  Dispense: 20 tablet; Refill: 0  2. Transverse myelitis (HCC) - traMADol  (ULTRAM ) 50 MG tablet; Take 1 tablet (50 mg total) by mouth every 12 (twelve) hours as needed. TAKE 1 TABLET EVERY 12 HOURS AS NEEDED  Dispense: 20 tablet; Refill: 1  3. Primary osteoarthritis of both knees - traMADol  (ULTRAM ) 50 MG tablet; Take 1 tablet (50 mg total) by mouth every 12 (twelve) hours as needed. TAKE 1 TABLET EVERY 12 HOURS AS NEEDED  Dispense: 20 tablet; Refill: 1   X-ray pending  Start naprosyn  BID with food for 5 days, no other NSAID's  Ultram  as needed for pain  Keep chronic follow up   Bari Learn, FNP

## 2023-10-16 NOTE — Patient Instructions (Signed)
 Cervical Sprain A cervical sprain is a stretch or tear in one or more of the ligaments in the neck. Ligaments are the tissues that connect bones to each other. Cervical sprains can range from mild to severe. Severe cervical sprains can cause the spinal bones (vertebrae) in the neck to be unstable. This can result in spinal cord damage and serious nervous system problems. Healing time for a cervical sprain depends on the cause and extent of the injury. Most cervical sprains heal in 4-6 weeks. What are the causes? Cervical sprains may be caused by trauma, such as an injury from a motor vehicle accident, a fall, or a sudden forward and backward whipping movement of the head and neck (whiplash injury). Mild cervical sprains may be caused by wear and tear over time. What increases the risk? You are more likely to get a cervical sprain if: You take part in activities that have a high risk of trauma to the neck. These include contact sports, gymnastics, and diving. You have: Osteoarthritis of the spine. Poor strength and flexibility of the neck. Poor posture. You have had a neck injury in the past. You spend long periods in positions that put stress on the neck, such as sitting at a computer. What are the signs or symptoms? Symptoms of this condition include: Any of these problems in the neck, shoulders, or upper back: Pain or tenderness. Stiffness. Swelling. A burning feeling. Sudden tightening of neck muscles (spasms). Limited ability to move the neck. Headache. Dizziness. Nausea or vomiting. Weakness, numbness, or tingling in a hand or an arm. Symptoms may develop right away after injury or may develop over a few days. In some cases, symptoms may go away with treatment and return (recur) over time. How is this diagnosed? This condition may be diagnosed based on: Your symptoms, medical history, and a physical exam. Any recent injuries or known neck problems that you have, such as arthritis  in the neck. Imaging tests, such as X-rays, an MRI, or a CT scan. How is this treated? This condition is treated by resting and icing the injured area and doing physical therapy exercises to improve movement and strength. Heat therapy may be used 2-3 days after the injury if there is no swelling. Depending on the severity of your condition, treatment may also include: Keeping your neck in place (immobilized) for periods of time. This may be done using: A cervical collar. This supports your chin and the back of your head. A cervical traction device. This is a sling that holds up your head. It removes weight and pressure from your neck. Medicines for pain or other symptoms. Surgery. This is rare. Follow these instructions at home: Medicines Take over-the-counter and prescription medicines only as told by your health care provider. Ask your provider if the medicine prescribed to you: Requires you to avoid driving or using machinery. Can cause constipation. You may need to take these actions to prevent or treat constipation: Drink enough fluid to keep your pee pale yellow. Take over-the-counter or prescription medicines. Eat foods that are high in fiber, such as beans, whole grains, and fresh fruits and vegetables. Limit foods that are high in fat and processed sugars, such as fried or sweet foods. If you have a cervical collar: Wear the collar as told by your provider. Do not remove it unless told. Ask before making any adjustments to your collar. If you have long hair, keep it outside of the collar. If you are allowed to remove the  collar for cleaning and bathing: Follow instructions about how to remove it safely. Clean it by hand with mild soap and water and air-dry it completely. If your collar has removable pads, remove them every 1-2 days and wash them by hand with soap and water. Let them air-dry completely before putting them back in the collar. Tell your provider if your skin under  the collar has irritation or sores. Managing pain, stiffness, and swelling     Use a cervical traction device as told. If told, put ice on the affected area. Put ice in a plastic bag. Place a towel between your skin and the bag. Leave the ice on for 20 minutes, 2-3 times a day. If told, apply heat to the affected area before you exercise or as often as told by your provider. Use the heat source that your provider recommends, such as a moist heat pack or a heating pad. Place a towel between your skin and the heat source. Leave the heat on for 20-30 minutes. If your skin turns bright red, remove the ice or heat right away to prevent skin damage. The risk of damage is higher if you cannot feel pain, heat, or cold. Activity Do not drive while wearing a cervical collar. If you do not have a cervical collar, ask if it is safe to drive while your neck heals. Do not lift anything that is heavier than 10 lb (4.5 kg) until your provider says that it is safe. Rest as told by your provider. Avoid positions and activities that make your symptoms worse. Do physical therapy exercises as told by your provider or physical therapist. Return to your normal activities as told by your provider. Ask your provider what activities are safe for you. General instructions Do not use any products that contain nicotine or tobacco. These products include cigarettes, chewing tobacco, and vaping devices, such as e-cigarettes. These can delay healing. If you need help quitting, ask your provider. Keep all follow-up visits. Your provider will monitor your injury and activity level. How is this prevented? To prevent a cervical sprain from happening again: Use and maintain good posture. Make any needed adjustments to your workstation to help you do this. Exercise regularly as told by your provider or physical therapist. Avoid risky activities that may cause a cervical sprain. Contact a health care provider if: You have  symptoms that get worse or do not get better after 2 weeks of treatment. You have new symptoms. Your pain gets worse or does not get better with medicine. You have sores or irritated skin on your neck from wearing your cervical collar. Get help right away if: You have severe pain. You develop numbness, tingling, or weakness in any part of your body. You cannot move a part of your body (you have paralysis). You have neck pain along with severe dizziness or headache. This information is not intended to replace advice given to you by your health care provider. Make sure you discuss any questions you have with your health care provider. Document Revised: 08/26/2021 Document Reviewed: 08/26/2021 Elsevier Patient Education  2024 [employer].

## 2023-10-25 ENCOUNTER — Ambulatory Visit: Payer: Self-pay | Admitting: Family

## 2023-10-26 ENCOUNTER — Telehealth: Payer: Self-pay | Admitting: Cardiology

## 2023-10-26 NOTE — Telephone Encounter (Signed)
 Pt c/o medication issue:  1. Name of Medication: metoprolol  tartrate (LOPRESSOR ) 25 MG tablet (Expired)   2. How are you currently taking this medication (dosage and times per day)?   Take 1 tablet (25 mg total) by mouth 2 (two) times daily.    3. Are you having a reaction (difficulty breathing--STAT)? No  4. What is your medication issue? Pt would like to know why she is taking this medication prescribed by Scot Ford.   Please advise.

## 2023-10-26 NOTE — Telephone Encounter (Signed)
 Spoke with pt regarding Lopressor . Pt stated she wanted to know why she was taking this medication. Pt was told there could be many reasons to be taking the medication. For her specifically it is for hypertension, chest pain and irregular heart rates. Pt verbalized understanding. All questions if any were answered.

## 2023-10-31 ENCOUNTER — Ambulatory Visit

## 2023-10-31 VITALS — BP 128/77 | HR 84 | Ht 63.0 in | Wt 149.0 lb

## 2023-10-31 DIAGNOSIS — Z0000 Encounter for general adult medical examination without abnormal findings: Secondary | ICD-10-CM | POA: Diagnosis not present

## 2023-10-31 NOTE — Patient Instructions (Signed)
 [name],  Thank you for taking the time for your Medicare Wellness Visit. I appreciate your continued commitment to your health goals. Please review the care plan we discussed, and feel free to reach out if I can assist you further.  Medicare recommends these wellness visits once per year to help you and your care team stay ahead of potential health issues. These visits are designed to focus on prevention, allowing your provider to concentrate on managing your acute and chronic conditions during your regular appointments.  Please note that Annual Wellness Visits do not include a physical exam. Some assessments may be limited, especially if the visit was conducted virtually. If needed, we may recommend a separate in-person follow-up with your provider.  Ongoing Care Seeing your primary care provider every 3 to 6 months helps us  monitor your health and provide consistent, personalized care.   Referrals If a referral was made during today's visit and you haven't received any updates within two weeks, please contact the referred provider directly to check on the status.  Recommended Screenings:  Health Maintenance  Topic Date Due   COVID-19 Vaccine (3 - Pfizer risk series) 06/26/2019   Medicare Annual Wellness Visit  03/23/2023   Flu Shot  09/07/2023   DEXA scan (bone density measurement)  11/24/2023*   DTaP/Tdap/Td vaccine (2 - Td or Tdap) 12/15/2023*   Pneumococcal Vaccine for age over 71 (2 of 2 - PCV) 12/15/2023*   Colon Cancer Screening  11/10/2025   Zoster (Shingles) Vaccine  Completed   HPV Vaccine  Aged Out   Meningitis B Vaccine  Aged Out   Breast Cancer Screening  Discontinued  *Topic was postponed. The date shown is not the original due date.       10/31/2023    3:30 PM  Advanced Directives  Does Patient Have a Medical Advance Directive? No   Advance Care Planning is important because it: Ensures you receive medical care that aligns with your values, goals, and  preferences. Provides guidance to your family and loved ones, reducing the emotional burden of decision-making during critical moments.  Vision: Annual vision screenings are recommended for early detection of glaucoma, cataracts, and diabetic retinopathy. These exams can also reveal signs of chronic conditions such as diabetes and high blood pressure.  Dental: Annual dental screenings help detect early signs of oral cancer, gum disease, and other conditions linked to overall health, including heart disease and diabetes.  Please see the attached documents for additional preventive care recommendations.

## 2023-10-31 NOTE — Progress Notes (Signed)
 Subjective:   Amy Black is a 83 y.o. who presents for a Medicare Wellness preventive visit.  As a reminder, Annual Wellness Visits don't include a physical exam, and some assessments may be limited, especially if this visit is performed virtually. We may recommend an in-person follow-up visit with your provider if needed.  Visit Complete: Virtual I connected with  Amy Black on 10/31/23 by a audio enabled telemedicine application and verified that I am speaking with the correct person using two identifiers.  Patient Location: Home  Provider Location: Home Office  I discussed the limitations of evaluation and management by telemedicine. The patient expressed understanding and agreed to proceed.  Vital Signs: Because this visit was a virtual/telehealth visit, some criteria may be missing or patient reported. Any vitals not documented were not able to be obtained and vitals that have been documented are patient reported.  VideoDeclined- This patient declined Librarian, academic. Therefore the visit was completed with audio only.  Persons Participating in Visit: Patient.  AWV Questionnaire: No: Patient Medicare AWV questionnaire was not completed prior to this visit.  Cardiac Risk Factors include: advanced age (>42men, >33 women)     Objective:    Today's Vitals   10/31/23 1519  BP: 128/77  Pulse: 84  Weight: 149 lb (67.6 kg)  Height: 5' 3 (1.6 m)   Body mass index is 26.39 kg/m.     10/31/2023    3:30 PM 09/13/2023    2:25 PM 06/13/2023    3:14 PM 03/08/2023    1:52 PM 12/27/2022   11:22 AM 12/04/2022    3:00 PM 08/17/2022   10:06 AM  Advanced Directives  Does Patient Have a Medical Advance Directive? No No No No No No No  Would patient like information on creating a medical advance directive?  No - Patient declined No - Patient declined No - Patient declined   No - Patient declined    Current Medications (verified) Outpatient  Encounter Medications as of 10/31/2023  Medication Sig   ALPRAZolam  (XANAX ) 0.25 MG tablet Take 1 tablet (0.25 mg total) by mouth 2 (two) times daily as needed for anxiety.   cetirizine  (ZYRTEC ) 10 MG tablet TAKE ONE TABLET BY MOUTH DAILY   cyclobenzaprine  (FLEXERIL ) 5 MG tablet Take 1 tablet (5 mg total) by mouth 3 (three) times daily as needed for muscle spasms.   diphenhydrAMINE  (BENADRYL ) 50 MG tablet Take 1 tablet (50 mg total) by mouth once for 1 dose. Take the morning of CT   hydrOXYzine  (ATARAX ) 10 MG tablet Take 1 tablet (10 mg total) by mouth 2 (two) times daily.   Hyoscyamine  Sulfate SL (LEVSIN /SL) 0.125 MG SUBL Place 0.125 mg under the tongue every 4 (four) hours as needed (abd cramping and diarrhea).   isosorbide  mononitrate (IMDUR ) 120 MG 24 hr tablet Take 1 tablet (120 mg total) by mouth daily.   metoprolol  tartrate (LOPRESSOR ) 25 MG tablet Take 1 tablet (25 mg total) by mouth 2 (two) times daily.   mirtazapine  (REMERON ) 15 MG tablet Take 1 tablet (15 mg total) by mouth at bedtime.   naproxen  (NAPROSYN ) 500 MG tablet Take 1 tablet (500 mg total) by mouth 2 (two) times daily with a meal.   nitroGLYCERIN  (NITROSTAT ) 0.4 MG SL tablet DISSOLVE ONE TABLET UNDER TONGUE EVERY 5 MINUTES UP TO 3 DOSES AS NEEDED FOR CHEST PAIN   omeprazole  (PRILOSEC) 40 MG capsule Take 1 capsule (40 mg total) by mouth daily.   traMADol  (  ULTRAM ) 50 MG tablet Take 1 tablet (50 mg total) by mouth every 12 (twelve) hours as needed. TAKE 1 TABLET EVERY 12 HOURS AS NEEDED   triamterene -hydrochlorothiazide  (DYAZIDE ) 37.5-25 MG capsule TAKE ONE CAPSULE BY MOUTH 3 TIMES PER WEEK   verapamil  (CALAN -SR) 180 MG CR tablet Take 1 tablet (180 mg total) by mouth at bedtime.   No facility-administered encounter medications on file as of 10/31/2023.    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 minimal CAD by cath in 2016 with presumed spasm (20% mLAD, 20% D2).   Cataract    bilateral cataracts removed   Coronary artery spasm    Diverticulosis of colon (without mention of hemorrhage)    Femoral neck fracture, right, closed, initial encounter 10/04/2015   GERD (gastroesophageal reflux disease)    hepatic cyst    History of nuclear stress test 04/2011   lexiscan ; normal study, no significant ischemia, low risk; 2015 - normal   HTN (hypertension)    PT. DENIES   Hyperlipidemia    Insomnia    Irritable bowel syndrome    Transverse myelitis (HCC)    Vitamin D  deficiency    Past Surgical History:  Procedure Laterality Date   ABDOMINAL HYSTERECTOMY Bilateral    CARDIAC CATHETERIZATION     coronary spasm   CARDIAC CATHETERIZATION N/A 08/28/2014   Procedure: Left Heart Cath and Coronary Angiography;  Surgeon: Candyce GORMAN Reek, MD;  Location: [facility] INVASIVE CV LAB;  Service: Cardiovascular;  Laterality: N/A;   COLONOSCOPY     DIAGNOSTIC LAPAROSCOPY     LYSIS OF ADHESION     PELVIC LAPAROSCOPY  1992   RSO, AND LSO ON 2007   RESECTION SOFT TISSUE TUMOR LEG / ANKLE RADICAL  2004   leg lesion resection & radiation   SALPINGOOPHORECTOMY Left    TOTAL HIP ARTHROPLASTY Right 10/04/2015   Procedure: TOTAL HIP ARTHROPLASTY ANTERIOR APPROACH;  Surgeon: Redell Shoals, MD;  Location: [facility] OR;  Service: Orthopedics;  Laterality: Right;   TRANSTHORACIC ECHOCARDIOGRAM  07/2012   LV cavity size mildly reduced, normal wall motion; MV with calcified annulus and mild MR; LA mildly dilated; atrial septum with increased thickness - lipomatous hypertrophy; RV systolic pressure increased (borderline pulm HTN)   UPPER GASTROINTESTINAL ENDOSCOPY     Family  History  Problem Relation Age of Onset   Bladder Cancer Mother    Heart disease Mother    Hypertension Mother    Heart disease Father    Stroke Father    Hyperlipidemia Sister    Depression Sister    Diabetes Brother    Seizures Brother    Hypertension Brother    Diabetes Other        Multiple family members on both sides    Coronary artery disease Other        Multiple family members on both sides    Colon cancer Neg Hx    Esophageal cancer Neg Hx    Pancreatic cancer Neg Hx    Rectal cancer Neg Hx    Stomach cancer Neg Hx    Breast cancer Neg Hx  Social History   Socioeconomic History   Marital status: Married    Spouse name: Melia   Number of children: 2   Years of education: 12   Highest education level: High school graduate  Occupational History   Occupation: Disabilty   Occupation: DISABILITY    Employer: UNEMPLOYED  Tobacco Use   Smoking status: Former    Current packs/day: 0.00    Average packs/day: 0.5 packs/day for 4.0 years (2.0 ttl pk-yrs)    Types: Cigarettes    Start date: 12/07/1968    Quit date: 11/20/1972    Years since quitting: 50.9   Smokeless tobacco: Never   Tobacco comments:    quit 40 years ago  Vaping Use   Vaping status: Never Used  Substance and Sexual Activity   Alcohol use: No    Alcohol/week: 0.0 standard drinks of alcohol   Drug use: No   Sexual activity: Not Currently    Birth control/protection: Surgical  Other Topics Concern   Not on file  Social History Narrative   Disabled, lives with husband and daughter.  Enjoys shopping. Lives in two story home   1 son and 1 daughter.   1 grandson.   Caffeine 2 cans of soda and tea   Right handed   Social Drivers of Health   Financial Resource Strain: Low Risk  (10/31/2023)   Overall Financial Resource Strain (CARDIA)    Difficulty of Paying Living Expenses: Not hard at all  Food Insecurity: No Food Insecurity (10/31/2023)   Hunger Vital Sign    Worried About Running Out of Food  in the Last Year: Never true    Ran Out of Food in the Last Year: Never true  Transportation Needs: No Transportation Needs (10/31/2023)   PRAPARE - Administrator, Civil Service (Medical): No    Lack of Transportation (Non-Medical): No  Physical Activity: Inactive (10/31/2023)   Exercise Vital Sign    Days of Exercise per Week: 0 days    Minutes of Exercise per Session: 0 min  Stress: No Stress Concern Present (10/31/2023)   [employer] of Occupational Health - Occupational Stress Questionnaire    Feeling of Stress: Not at all  Social Connections: Moderately Integrated (10/31/2023)   Social Connection and Isolation Panel    Frequency of Communication with Friends and Family: More than three times a week    Frequency of Social Gatherings with Friends and Family: More than three times a week    Attends Religious Services: More than 4 times per year    Active Member of [employer] or Organizations: No    Attends Engineer, structural: Never    Marital Status: Married    Tobacco Counseling Counseling given: Yes Tobacco comments: quit 40 years ago    Clinical Intake:  Pre-visit preparation completed: Yes  Pain : No/denies pain     BMI - recorded: 26.39 Nutritional Status: BMI 25 -29 Overweight Nutritional Risks: None Diabetes: No  No results found for: HGBA1C   How often do you need to have someone help you when you read instructions, pamphlets, or other written materials from your doctor or pharmacy?: 1 - Never  Interpreter Needed?: No  Information entered by :: alia t/cma   Activities of Daily Living     10/31/2023    3:24 PM  In your present state of health, do you have any difficulty performing the following activities:  Hearing? 1  Vision? 0  Difficulty concentrating or making decisions? 0  Walking or climbing stairs? 1  Dressing or bathing? 0  Doing errands, shopping? 1  Comment pt brother  Quarry manager and eating ? N  Using the  Toilet? N  In the past six months, have you accidently leaked urine? N  Do you have problems with loss of bowel control? N  Managing your Medications? N  Managing your Finances? N  Housekeeping or managing your Housekeeping? N    Patient Care Team: [name], FNP as PCP - General (Family Medicine) Lavona Agent, MD as PCP - Cardiology (Cardiology) Rachelle Krabbe, PT (Inactive) as Physical Therapist (Physical Therapy) Franchot Lauraine HERO, NP as Nurse Practitioner (Nurse Practitioner) Legrand Victory LITTIE DOUGLAS, MD as Consulting Physician (Gastroenterology) Fidel Rogue, MD as Consulting Physician (Orthopedic Surgery) Timmy Maude SAUNDERS, MD as Consulting Physician (Oncology)  I have updated your Care Teams any recent Medical Services you may have received from other providers in the past year.     Assessment:   This is a routine wellness examination for [place].  Hearing/Vision screen Hearing Screening - Comments:: Pt hearing aids Vision Screening - Comments:: Pt wear glasses/pt goes to Dr. Edmonia, ,[place]/last 2025   Goals Addressed   None    Depression Screen     10/31/2023    3:30 PM 09/13/2023    2:28 PM 08/14/2023    1:50 PM 01/01/2023    2:07 PM 10/06/2022    2:08 PM 10/02/2022   10:34 AM 08/07/2022    1:53 PM  PHQ 2/9 Scores  PHQ - 2 Score 0 0 0 0 0 0 0  PHQ- 9 Score   0 0 0 0     Fall Risk     10/31/2023    3:22 PM 10/16/2023    2:44 PM 08/14/2023    1:50 PM 01/01/2023    2:08 PM 12/15/2022    2:46 PM  Fall Risk   Falls in the past year? 0 0 0 1 1  Number falls in past yr: 0 0 0 0 1  Injury with Fall? 0 0 0 1 1  Risk for fall due to : No Fall Risks No Fall Risks No Fall Risks Impaired balance/gait No Fall Risks  Follow up Falls evaluation completed Falls evaluation completed;Education provided Falls evaluation completed Falls evaluation completed Falls evaluation completed;Education provided    MEDICARE RISK AT HOME:  Medicare Risk at Home Any stairs in  or around the home?: Yes If so, are there any without handrails?: Yes Home free of loose throw rugs in walkways, pet beds, electrical cords, etc?: Yes Adequate lighting in your home to reduce risk of falls?: Yes Life alert?: No Use of a cane, walker or w/c?: Yes Grab bars in the bathroom?: No Shower chair or bench in shower?: Yes Elevated toilet seat or a handicapped toilet?: No  TIMED UP AND GO:  Was the test performed?  no  Cognitive Function: 6CIT completed    06/14/2017    3:00 PM 12/07/2015    3:11 PM  MMSE - Mini Mental State Exam  Orientation to time 5 5   Orientation to Place 5 5   Registration 3 3   Attention/ Calculation 3 3   Recall 3 3   Language- name 2 objects 2 2   Language- repeat 1 1  Language- follow 3 step command 3 3   Language- read & follow direction 1 1   Write a sentence 1 1   Copy design 1 1   Total score  28 28      Data saved with a previous flowsheet row definition        10/31/2023    3:32 PM 03/22/2022   10:22 AM 09/16/2019    9:35 AM 09/10/2018    2:21 PM  6CIT Screen  What Year? 0 points 0 points 0 points 0 points  What month? 0 points 0 points 0 points 0 points  What time? 0 points 0 points 0 points 0 points  Count back from 20 0 points 0 points 2 points 0 points  Months in reverse 2 points 0 points 4 points 2 points  Repeat phrase 0 points 0 points 0 points 0 points  Total Score 2 points 0 points 6 points 2 points    Immunizations Immunization History  Administered Date(s) Administered   PFIZER(Purple Top)SARS-COV-2 Vaccination 05/08/2019, 05/29/2019   Pneumococcal Polysaccharide-23 02/07/2000   Tdap 02/06/2002   Zoster Recombinant(Shingrix ) 11/06/2016, 01/08/2017   Zoster, Live 02/06/2010    Screening Tests Health Maintenance  Topic Date Due   COVID-19 Vaccine (3 - Pfizer risk series) 06/26/2019   Influenza Vaccine  09/07/2023   DEXA SCAN  11/24/2023 (Originally 12/06/2017)   DTaP/Tdap/Td (2 - Td or Tdap) 12/15/2023  (Originally 02/07/2012)   Pneumococcal Vaccine: 50+ Years (2 of 2 - PCV) 12/15/2023 (Originally 02/06/2001)   Medicare Annual Wellness (AWV)  10/30/2024   Colonoscopy  11/10/2025   Zoster Vaccines- Shingrix   Completed   HPV VACCINES  Aged Out   Meningococcal B Vaccine  Aged Out   Mammogram  Discontinued    Health Maintenance Items Addressed: See Nurse Notes at the end of this note  Additional Screening:  Vision Screening: Recommended annual ophthalmology exams for early detection of glaucoma and other disorders of the eye. Is the patient up to date with their annual eye exam?  Yes  Who is the provider or what is the name of the office in which the patient attends annual eye exams? Dr. Robinson in [place]  Dental Screening: Recommended annual dental exams for proper oral hygiene  Community Resource Referral / Chronic Care Management: CRR required this visit?  No   CCM required this visit?  No   Plan:    I have personally reviewed and noted the following in the patient's chart:   Medical and social history Use of alcohol, tobacco or illicit drugs  Current medications and supplements including opioid prescriptions. Patient is not currently taking opioid prescriptions. Functional ability and status Nutritional status Physical activity Advanced directives List of other physicians Hospitalizations, surgeries, and ER visits in previous 12 months Vitals Screenings to include cognitive, depression, and falls Referrals and appointments  In addition, I have reviewed and discussed with patient certain preventive protocols, quality metrics, and best practice recommendations. A written personalized care plan for preventive services as well as general preventive health recommendations were provided to patient.   Ozie Ned, CMA   10/31/2023   After Visit Summary: (MyChart) Due to this being a telephonic visit, the after visit summary with patients personalized plan was offered to  patient via MyChart   Notes: Nothing significant to report at this time.

## 2023-11-02 ENCOUNTER — Other Ambulatory Visit: Payer: Self-pay | Admitting: Medical Oncology

## 2023-11-02 DIAGNOSIS — Z1231 Encounter for screening mammogram for malignant neoplasm of breast: Secondary | ICD-10-CM

## 2023-11-05 ENCOUNTER — Other Ambulatory Visit: Payer: Self-pay | Admitting: Nurse Practitioner

## 2023-11-05 ENCOUNTER — Ambulatory Visit: Payer: Self-pay

## 2023-11-05 ENCOUNTER — Other Ambulatory Visit: Payer: Self-pay | Admitting: Family

## 2023-11-05 NOTE — Telephone Encounter (Signed)
 FYI Only or Action Required?: FYI only for provider.  Patient was last seen in primary care on 10/16/2023 by [name], FNP.  Called Nurse Triage reporting Abdominal Pain.  Symptoms began a week ago.  Interventions attempted: Rest, hydration, or home remedies.  Symptoms are: unchanged.  Triage Disposition: See Physician Within 24 Hours  Patient/caregiver understands and will follow disposition?: Yes, but will wait  Copied from CRM #[id number]. Topic: Clinical - Red Word Triage >> Nov 05, 2023  3:12 PM Mia F wrote: Red Word that prompted transfer to Nurse Triage: Pt is having stomach pains for two weeks. Seems to be worsening. No other symptoms. Have been trying otc medications and not working. She says she goes to bed with the pain and wake up with the pain. Reason for Disposition  [1] MILD pain (e.g., does not interfere with normal activities) AND [2] pain comes and goes (cramps) AND [3] present > 48 hours  (Exception: This same abdominal pain is a chronic symptom recurrent or ongoing AND present > 4 weeks.)  Answer Assessment - Initial Assessment Questions Additional info: Prefers home care but willing for acute visit. Scheduled on preferred day of Wednesday. States she will call back if getting worse.    1. LOCATION: Where does it hurt?      Mid right  2. RADIATION: Does the pain shoot anywhere else? (e.g., chest, back)     Denies  3. ONSET: When did the pain begin? (e.g., minutes, hours or days ago)      Two weeks ago 4. SUDDEN: Gradual or sudden onset?     Gradual  5. PATTERN Does the pain come and go, or is it constant?     Intermittent  6. SEVERITY: How bad is the pain?  (e.g., Scale 1-10; mild, moderate, or severe)     Nagging  7. RECURRENT SYMPTOM: Have you ever had this type of stomach pain before? If Yes, ask: When was the last time? and What happened that time?      Yes, for two weeks 8. CAUSE: What do you think is causing the stomach pain?  (e.g., gallstones, recent abdominal surgery)     Unsure  9. RELIEVING/AGGRAVATING FACTORS: What makes it better or worse? (e.g., antacids, bending or twisting motion, bowel movement)     Worse as day goes on  10. OTHER SYMPTOMS: Do you have any other symptoms? (e.g., back pain, diarrhea, fever, urination pain, vomiting)       Denies  Protocols used: Abdominal Pain - Female-A-AH

## 2023-11-05 NOTE — Telephone Encounter (Signed)
 Appt noted patient states she prefers Wednesday

## 2023-11-07 ENCOUNTER — Ambulatory Visit

## 2023-11-07 ENCOUNTER — Telehealth: Payer: Self-pay | Admitting: Family

## 2023-11-07 ENCOUNTER — Encounter: Payer: Self-pay | Admitting: Family Medicine

## 2023-11-07 ENCOUNTER — Ambulatory Visit (INDEPENDENT_AMBULATORY_CARE_PROVIDER_SITE_OTHER): Admitting: Family Medicine

## 2023-11-07 VITALS — BP 117/68 | HR 53 | Temp 97.3°F | Ht 63.0 in | Wt 150.0 lb

## 2023-11-07 DIAGNOSIS — Z91041 Radiographic dye allergy status: Secondary | ICD-10-CM | POA: Diagnosis not present

## 2023-11-07 DIAGNOSIS — R1031 Right lower quadrant pain: Secondary | ICD-10-CM

## 2023-11-07 DIAGNOSIS — R1084 Generalized abdominal pain: Secondary | ICD-10-CM | POA: Diagnosis not present

## 2023-11-07 MED ORDER — HYOSCYAMINE SULFATE SL 0.125 MG SL SUBL: 0.1250 mg | SUBLINGUAL_TABLET | Freq: Four times a day (QID) | SUBLINGUAL | 0 refills | Status: AC | PRN

## 2023-11-07 MED ORDER — DIPHENHYDRAMINE HCL 50 MG PO TABS: 50.0000 mg | ORAL_TABLET | Freq: Once | ORAL | Status: DC

## 2023-11-07 MED ORDER — PREDNISONE 50 MG PO TABS: 50.0000 mg | ORAL_TABLET | Freq: Four times a day (QID) | ORAL | 0 refills | Status: DC

## 2023-11-07 MED ORDER — DIPHENHYDRAMINE HCL 50 MG PO TABS: 50.0000 mg | ORAL_TABLET | Freq: Once | ORAL | 0 refills | Status: AC

## 2023-11-07 NOTE — Patient Instructions (Addendum)
 You will need to arrive at [facility] tomorrow by 7:30 AM.  Take prednisone  50mg  treatment in preparation for your CT abdomen/pelvis WITH contrast as follows: First dose tonight at 8:30pm Second dose 2:30am Last dose 8:30am Take benadryl  50 mg at 8:30am with the last dose of prednisone 

## 2023-11-07 NOTE — Telephone Encounter (Unsigned)
 Copied from CRM #[id number]. Topic: Clinical - Medication Question >> Nov 07, 2023 11:38 AM Willma R wrote: Reason for CRM: Patient is currently taking ALPRAZolam  (XANAX ) 0.25 MG tablet, cyclobenzaprine  (FLEXERIL ) 5 MG tablet and traMADol  (ULTRAM ) 50 MG tablet , which causes falls and confusion, they are requesting to see if the patient can be prescribed something else or stopped.  Hassina can be reached at [phone]

## 2023-11-07 NOTE — Progress Notes (Addendum)
 Subjective: CC: Abdominal pain PCP: [name], FNP YEP:Rjmnobw P Werk is a 83 y.o. female presenting to clinic today for:  Abdominal pain Patient reports that she has been having some sharp and gnawing abdominal pain in the right lower quadrant and middle lower abdomen.  She reports no diarrhea or constipation but notes that her baseline is diarrhea and she actually has normal bowel movements currently.  Denies any blood in stool.  No fevers.  No nausea or vomiting.  Pain is not associated with eating.  She does report some increased gas production.   ROS: Per HPI  Allergies  Allergen Reactions   Iodine Itching   Ivp Dye [Iodinated Contrast Media] Itching   Amlodipine  Other (See Comments)    headaches   Lexapro  [Escitalopram  Oxalate] Other (See Comments)    Drunk, High feeling   Tizanidine  Other (See Comments)    Other reaction(s):Drunk feeling the next day       Cymbalta  [Duloxetine  Hcl] Other (See Comments)    sleepiness   Duloxetine  Other (See Comments)    Other reaction(s): sleepy   Gabapentin  Other (See Comments)    sleepiness   Zanaflex  [Tizanidine  Hcl] Other (See Comments)    Very drunk feeling next day   Past Medical History:  Diagnosis Date   Adenocarcinoma (HCC)    LEFT LEG   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 minimal CAD by cath in 2016 with presumed spasm (20% mLAD, 20% D2).   Cataract    bilateral cataracts removed   Coronary artery spasm    Diverticulosis of colon (without mention of hemorrhage)    Femoral neck fracture, right, closed, initial encounter 10/04/2015   GERD (gastroesophageal reflux disease)    hepatic cyst    History of nuclear stress test 04/2011   lexiscan ; normal study, no significant ischemia, low risk; 2015 - normal   HTN (hypertension)    PT. DENIES   Hyperlipidemia    Insomnia    Irritable bowel syndrome    Transverse myelitis  (HCC)    Vitamin D  deficiency     Current Outpatient Medications:    ALPRAZolam  (XANAX ) 0.25 MG tablet, Take 1 tablet (0.25 mg total) by mouth 2 (two) times daily as needed for anxiety., Disp: 90 tablet, Rfl: 2   cetirizine  (ZYRTEC ) 10 MG tablet, TAKE ONE TABLET BY MOUTH DAILY, Disp: 30 tablet, Rfl: 5   cyclobenzaprine  (FLEXERIL ) 5 MG tablet, TAKE ONE TABLET UP TO THREE TIMES DAILY AS NEEDED FOR MUSCLE SPASMS, Disp: 60 tablet, Rfl: 2   hydrOXYzine  (ATARAX ) 10 MG tablet, Take 1 tablet (10 mg total) by mouth 2 (two) times daily., Disp: 60 tablet, Rfl: 0   Hyoscyamine  Sulfate SL (LEVSIN /SL) 0.125 MG SUBL, Place 0.125 mg under the tongue every 4 (four) hours as needed (abd cramping and diarrhea)., Disp: 120 tablet, Rfl: 5   isosorbide  mononitrate (IMDUR ) 120 MG 24 hr tablet, Take 1 tablet (120 mg total) by mouth daily., Disp: 90 tablet, Rfl: 3   metoprolol  tartrate (LOPRESSOR ) 25 MG tablet, Take 1 tablet (25 mg total) by mouth 2 (two) times daily., Disp: 180 tablet, Rfl: 3   nitroGLYCERIN  (NITROSTAT ) 0.4 MG SL tablet, DISSOLVE ONE TABLET UNDER TONGUE EVERY 5 MINUTES UP TO 3 DOSES AS NEEDED FOR CHEST PAIN, Disp: 25 tablet, Rfl: 11   omeprazole  (PRILOSEC) 40 MG capsule, Take 1  capsule (40 mg total) by mouth daily., Disp: 90 capsule, Rfl: 1   predniSONE  (DELTASONE ) 50 MG tablet, Take 1 tablet (50 mg total) by mouth every 6 (six) hours for 3 doses. Take on dates/times instructed by prescribing provider., Disp: 3 tablet, Rfl: 0   traMADol  (ULTRAM ) 50 MG tablet, Take 1 tablet (50 mg total) by mouth every 12 (twelve) hours as needed. TAKE 1 TABLET EVERY 12 HOURS AS NEEDED, Disp: 20 tablet, Rfl: 1   triamterene -hydrochlorothiazide  (DYAZIDE ) 37.5-25 MG capsule, TAKE ONE CAPSULE BY MOUTH 3 TIMES PER WEEK, Disp: 90 capsule, Rfl: 3   verapamil  (CALAN -SR) 180 MG CR tablet, TAKE ONE TABLET AT BEDTIME, Disp: 90 tablet, Rfl: 3   diphenhydrAMINE  (BENADRYL ) 50 MG tablet, Take 1 tablet (50 mg total) by mouth once for 1  dose. Take 1 hour before CT, Disp: 1 tablet, Rfl: 0   mirtazapine  (REMERON ) 15 MG tablet, Take 1 tablet (15 mg total) by mouth at bedtime. (Patient not taking: Reported on 11/07/2023), Disp: 90 tablet, Rfl: 1   naproxen  (NAPROSYN ) 500 MG tablet, Take 1 tablet (500 mg total) by mouth 2 (two) times daily with a meal. (Patient not taking: Reported on 11/07/2023), Disp: 20 tablet, Rfl: 0 Social History   Socioeconomic History   Marital status: Married    Spouse name: Melia   Number of children: 2   Years of education: 12   Highest education level: High school graduate  Occupational History   Occupation: Disabilty   Occupation: DISABILITY    Employer: UNEMPLOYED  Tobacco Use   Smoking status: Former    Current packs/day: 0.00    Average packs/day: 0.5 packs/day for 4.0 years (2.0 ttl pk-yrs)    Types: Cigarettes    Start date: 12/07/1968    Quit date: 11/20/1972    Years since quitting: 50.9   Smokeless tobacco: Never   Tobacco comments:    quit 40 years ago  Vaping Use   Vaping status: Never Used  Substance and Sexual Activity   Alcohol use: No    Alcohol/week: 0.0 standard drinks of alcohol   Drug use: No   Sexual activity: Not Currently    Birth control/protection: Surgical  Other Topics Concern   Not on file  Social History Narrative   Disabled, lives with husband and daughter.  Enjoys shopping. Lives in two story home   1 son and 1 daughter.   1 grandson.   Caffeine 2 cans of soda and tea   Right handed   Social Drivers of Health   Financial Resource Strain: Low Risk  (10/31/2023)   Overall Financial Resource Strain (CARDIA)    Difficulty of Paying Living Expenses: Not hard at all  Food Insecurity: No Food Insecurity (10/31/2023)   Hunger Vital Sign    Worried About Running Out of Food in the Last Year: Never true    Ran Out of Food in the Last Year: Never true  Transportation Needs: No Transportation Needs (10/31/2023)   PRAPARE - Scientist, research (physical sciences) (Medical): No    Lack of Transportation (Non-Medical): No  Physical Activity: Inactive (10/31/2023)   Exercise Vital Sign    Days of Exercise per Week: 0 days    Minutes of Exercise per Session: 0 min  Stress: No Stress Concern Present (10/31/2023)   [employer] of Occupational Health - Occupational Stress Questionnaire    Feeling of Stress: Not at all  Social Connections: Moderately Integrated (10/31/2023)   Social Connection and Isolation  Panel    Frequency of Communication with Friends and Family: More than three times a week    Frequency of Social Gatherings with Friends and Family: More than three times a week    Attends Religious Services: More than 4 times per year    Active Member of [employer] or Organizations: No    Attends Banker Meetings: Never    Marital Status: Married  Catering manager Violence: Not At Risk (10/31/2023)   Humiliation, Afraid, Rape, and Kick questionnaire    Fear of Current or Ex-Partner: No    Emotionally Abused: No    Physically Abused: No    Sexually Abused: No   Family History  Problem Relation Age of Onset   Bladder Cancer Mother    Heart disease Mother    Hypertension Mother    Heart disease Father    Stroke Father    Hyperlipidemia Sister    Depression Sister    Diabetes Brother    Seizures Brother    Hypertension Brother    Diabetes Other        Multiple family members on both sides    Coronary artery disease Other        Multiple family members on both sides    Colon cancer Neg Hx    Esophageal cancer Neg Hx    Pancreatic cancer Neg Hx    Rectal cancer Neg Hx    Stomach cancer Neg Hx    Breast cancer Neg Hx     Objective: Office vital signs reviewed. BP 117/68   Pulse (!) 53   Temp (!) 97.3 F (36.3 C)   Ht 5' 3 (1.6 m)   Wt 150 lb (68 kg)   SpO2 98%   BMI 26.57 kg/m   Physical Examination:  General: Awake, alert, well nourished, No acute distress GI: nondistended, soft. +TTP to RLQ, and  lower middle abdomen.  Assessment/ Plan: 83 y.o. female   Generalized abdominal pain - Plan: predniSONE  (DELTASONE ) 50 MG tablet, CANCELED: DG Abd 1 View  Right lower quadrant abdominal pain - Plan: predniSONE  (DELTASONE ) 50 MG tablet, CT ABDOMEN PELVIS W CONTRAST, CANCELED: CT ABDOMEN PELVIS W CONTRAST  Contrast media allergy - Plan: predniSONE  (DELTASONE ) 50 MG tablet   11/2020 colonoscopy with diverticulosis of the large intestine.  I am concerned given change in bowel habits and abdominal pain that is new for the patient and not consistent with her typical irritable bowel that this is a diverticulitis.  CT abdomen pelvis with contrast ordered.  We will pretreat with prednisone  starting 13 hours prior to 9:30 AM scan.  Discussed intervals of when she should take the prednisone  with the patient.  She knows to arrive at [facility] at 7:30 AM.  **I left both a voicemail on her cell phone number and spoke to her send White to reiterate the timing of that prednisone  and Benadryl  to correlate with the IV contrast not the oral contrast as we had last discussed.  Norene CHRISTELLA Fielding, DO [facility] [phone]

## 2023-11-08 ENCOUNTER — Ambulatory Visit (HOSPITAL_COMMUNITY)
Admission: RE | Admit: 2023-11-08 | Discharge: 2023-11-08 | Disposition: A | Discharge status: Home / Self Care | Source: Ambulatory Visit | Attending: Family Medicine | Admitting: Family Medicine

## 2023-11-08 ENCOUNTER — Telehealth: Payer: Self-pay

## 2023-11-08 DIAGNOSIS — K5792 Diverticulitis of intestine, part unspecified, without perforation or abscess without bleeding: Secondary | ICD-10-CM | POA: Diagnosis not present

## 2023-11-08 DIAGNOSIS — K838 Other specified diseases of biliary tract: Secondary | ICD-10-CM | POA: Diagnosis not present

## 2023-11-08 DIAGNOSIS — R1031 Right lower quadrant pain: Secondary | ICD-10-CM | POA: Diagnosis not present

## 2023-11-08 DIAGNOSIS — K5730 Diverticulosis of large intestine without perforation or abscess without bleeding: Secondary | ICD-10-CM | POA: Diagnosis not present

## 2023-11-08 DIAGNOSIS — R932 Abnormal findings on diagnostic imaging of liver and biliary tract: Secondary | ICD-10-CM | POA: Diagnosis not present

## 2023-11-08 MED ORDER — IOHEXOL 300 MG/ML  SOLN
100.0000 mL | Freq: Once | INTRAMUSCULAR | Status: AC | PRN
  Administered 2023-11-08: 100 mL via INTRAVENOUS

## 2023-11-08 NOTE — Telephone Encounter (Signed)
 [facility] AWARE

## 2023-11-08 NOTE — Addendum Note (Signed)
 Addended by: LAVELL LYE A on: 11/08/2023 04:38 PM   Modules accepted: Orders

## 2023-11-08 NOTE — Telephone Encounter (Signed)
 Negative diverticulosis. Moderate diffuse common bile duct and MRI ordered

## 2023-11-08 NOTE — Telephone Encounter (Signed)
 Patient aware and verbalized understanding.

## 2023-11-08 NOTE — Telephone Encounter (Signed)
 Pt can stop Flexeril . She does take Xanax  and Ultram  as needed but not together.

## 2023-11-08 NOTE — Telephone Encounter (Signed)
 Call report from CT Abdomen pelvis - please review

## 2023-11-09 ENCOUNTER — Ambulatory Visit: Payer: Self-pay | Admitting: Family Medicine

## 2023-11-19 ENCOUNTER — Ambulatory Visit (HOSPITAL_COMMUNITY)
Admission: RE | Admit: 2023-11-19 | Discharge: 2023-11-19 | Disposition: A | Discharge status: Home / Self Care | Source: Ambulatory Visit | Attending: Family | Admitting: Family

## 2023-11-19 DIAGNOSIS — K838 Other specified diseases of biliary tract: Secondary | ICD-10-CM | POA: Diagnosis not present

## 2023-11-19 DIAGNOSIS — K7689 Other specified diseases of liver: Secondary | ICD-10-CM | POA: Diagnosis not present

## 2023-11-19 DIAGNOSIS — K8689 Other specified diseases of pancreas: Secondary | ICD-10-CM | POA: Diagnosis not present

## 2023-11-19 DIAGNOSIS — N281 Cyst of kidney, acquired: Secondary | ICD-10-CM | POA: Diagnosis not present

## 2023-11-19 DIAGNOSIS — R1031 Right lower quadrant pain: Secondary | ICD-10-CM | POA: Diagnosis not present

## 2023-11-19 MED ORDER — GADOBUTROL 1 MMOL/ML IV SOLN
7.0000 mL | Freq: Once | INTRAVENOUS | Status: AC | PRN
  Administered 2023-11-19: 7 mL via INTRAVENOUS

## 2023-11-20 ENCOUNTER — Ambulatory Visit: Payer: Self-pay | Admitting: Family

## 2023-12-03 ENCOUNTER — Telehealth: Payer: Self-pay | Admitting: Family Medicine

## 2023-12-03 ENCOUNTER — Ambulatory Visit: Payer: Self-pay

## 2023-12-03 NOTE — Telephone Encounter (Signed)
 Patient needs appt for her chronic follow up if she is wanting her refill on this med. Please call patient to make an appt. The last time was seen was for an acute issue.

## 2023-12-03 NOTE — Telephone Encounter (Signed)
 Apt made. LS

## 2023-12-03 NOTE — Telephone Encounter (Signed)
 Pt aware she needs to schedule appt for refill, declined to schedule appt said she would call back.

## 2023-12-03 NOTE — Telephone Encounter (Signed)
 FYI Only or Action Required?: FYI only for provider.  Patient was last seen in primary care on 11/07/2023 by Jolinda Norene HERO, DO.  Called Nurse Triage reporting No chief complaint on file..  Symptoms began several weeks ago.  Interventions attempted: Prescription medications: Xanax , Rest, hydration, or home remedies, and Ice/heat application.  Symptoms are: gradually worsening.  Triage Disposition: See PCP When Office is Open (Within 3 Days)  Patient/caregiver understands and will follow disposition?: Yes  Message from Deaijah H sent at 12/03/2023 11:22 AM EDT  Reason for Triage:Mentioned being under a lot of stress    Reason for Disposition  MODERATE anxiety (e.g., persistent or frequent anxiety symptoms; interferes with sleep, school, or work)  Answer Assessment - Initial Assessment Questions Patient with history of anxiety. She states that it has been increased recently with caring for her husband with dementia. She only gets small breaks from being at the house with him and feels it is getting difficult to care for herself on top of him. Has not been to church in almost a month. She is usually one of the first ones there on Sunday. Advised to reach out to her pastor to request a home visit or even phone conversation. She agrees. Pt received call from office to schedule appt to increase meds. Patient with appt Weds with Dr Jolinda to discuss further.  Patient does not have therapist. Speaks with daughter with upset. Looking for resources to help out in the home. Advised to ask about support groups as well. Will reach out to [phone] if needing to talk. Son commit suicide in 1970's so very conscious of her feelings. Denies SI/HI  1. CONCERN: Did anything happen that prompted you to call today?      Increased anxiety and caregiver strain 2. ANXIETY SYMPTOMS: Can you describe how you (your loved one; patient) have been feeling? (e.g., tense, restless, panicky, anxious, keyed up,  overwhelmed, sense of impending doom).      Restless and anxious- she feels like she just needs to get out of the house. 3. ONSET: How long have you been feeling this way? (e.g., hours, days, weeks)     years 4. SEVERITY: How would you rate the level of anxiety? (e.g., 0 - 10; or mild, moderate, severe).     severe 5. FUNCTIONAL IMPAIRMENT: How have these feelings affected your ability to do daily activities? Have you had more difficulty than usual doing your normal daily activities? (e.g., getting better, same, worse; self-care, school, work, interactions)     Neglecting her self care. Hasn't been to church in 2-3 months.  6. HISTORY: Have you felt this way before? Have you ever been diagnosed with an anxiety problem in the past? (e.g., generalized anxiety disorder, panic attacks, PTSD). If Yes, ask: How was this problem treated? (e.g., medicines, counseling, etc.)     History of anxiety 7. RISK OF HARM - SUICIDAL IDEATION: Do you ever have thoughts of hurting or killing yourself? If Yes, ask:  Do you have these feelings now? Do you have a plan on how you would do this?     Denies- lost her son to suicide when he was 14old 8. TREATMENT:  What has been done so far to treat this anxiety? (e.g., medicines, relaxation strategies). What has helped?     Xanax - looking to increase the frequency or the dose 9. THERAPIST: Do you have a counselor or therapist? If Yes, ask: What is their name?     Denies- talks with  daughter 61. POTENTIAL TRIGGERS: Do you drink caffeinated beverages (e.g., coffee, colas, teas), and how much daily? Do you drink alcohol or use any drugs? Have you started any new medicines recently?       Increased Caregiver strain 11. PATIENT SUPPORT: Who is with you now? Who do you live with? Do you have family or friends who you can talk to?        Cares for husband with dementia 12. OTHER SYMPTOMS: Do you have any other symptoms? (e.g., feeling  depressed, trouble concentrating, trouble sleeping, trouble breathing, palpitations or fast heartbeat, chest pain, sweating, nausea, or diarrhea)       Denies  Protocols used: Anxiety and Panic Attack-A-AH

## 2023-12-03 NOTE — Telephone Encounter (Signed)
 Copied from CRM [id number]. Topic: Clinical - Medication Question >> Dec 03, 2023 11:14 AM Deaijah H wrote: Reason for CRM: Patient would like to be back on ALPRAZolam  (XANAX ) 0.25 MG tablet if not, would like to know if she could be put on to something similar.

## 2023-12-05 ENCOUNTER — Ambulatory Visit: Admitting: Family Medicine

## 2023-12-06 ENCOUNTER — Encounter: Payer: Self-pay | Admitting: Family

## 2023-12-06 ENCOUNTER — Ambulatory Visit (INDEPENDENT_AMBULATORY_CARE_PROVIDER_SITE_OTHER): Admitting: Family

## 2023-12-06 VITALS — BP 125/70 | HR 67 | Temp 97.5°F | Ht 63.0 in | Wt 154.2 lb

## 2023-12-06 DIAGNOSIS — R350 Frequency of micturition: Secondary | ICD-10-CM

## 2023-12-06 DIAGNOSIS — K219 Gastro-esophageal reflux disease without esophagitis: Secondary | ICD-10-CM

## 2023-12-06 DIAGNOSIS — F411 Generalized anxiety disorder: Secondary | ICD-10-CM | POA: Diagnosis not present

## 2023-12-06 DIAGNOSIS — E559 Vitamin D deficiency, unspecified: Secondary | ICD-10-CM

## 2023-12-06 DIAGNOSIS — I10 Essential (primary) hypertension: Secondary | ICD-10-CM | POA: Diagnosis not present

## 2023-12-06 DIAGNOSIS — M170 Bilateral primary osteoarthritis of knee: Secondary | ICD-10-CM

## 2023-12-06 DIAGNOSIS — M542 Cervicalgia: Secondary | ICD-10-CM

## 2023-12-06 DIAGNOSIS — G373 Acute transverse myelitis in demyelinating disease of central nervous system: Secondary | ICD-10-CM

## 2023-12-06 LAB — MICROSCOPIC EXAMINATION
Bacteria, UA: NONE SEEN
RBC, Urine: NONE SEEN /HPF (ref 0–2)
Renal Epithel, UA: NONE SEEN /HPF
Yeast, UA: NONE SEEN

## 2023-12-06 LAB — URINALYSIS, COMPLETE
Bilirubin, UA: NEGATIVE
Glucose, UA: NEGATIVE
Ketones, UA: NEGATIVE
Nitrite, UA: NEGATIVE
Protein,UA: NEGATIVE
RBC, UA: NEGATIVE
Specific Gravity, UA: 1.015 (ref 1.005–1.030)
Urobilinogen, Ur: 0.2 mg/dL (ref 0.2–1.0)
pH, UA: 7 (ref 5.0–7.5)

## 2023-12-06 MED ORDER — ALPRAZOLAM 0.25 MG PO TABS: 0.2500 mg | ORAL_TABLET | Freq: Three times a day (TID) | ORAL | 2 refills | Status: AC | PRN

## 2023-12-06 MED ORDER — TRAMADOL HCL 50 MG PO TABS: 50.0000 mg | ORAL_TABLET | Freq: Two times a day (BID) | ORAL | 1 refills | Status: AC | PRN

## 2023-12-06 NOTE — Patient Instructions (Signed)

## 2023-12-06 NOTE — Progress Notes (Signed)
 Subjective:    Patient ID: Amy Black, female    DOB: 1941-01-31, 83 y.o.   MRN: [id number]  Chief Complaint  Patient presents with   Medical Management of Chronic Issues   Urinary Frequency   Pt presents to the office today for chronic follow up.   She is followed by Cardiologists every 6 months for CAD and HTN.   She is followed by Oncologists/hematologists for iron  anemia and hx adenocarcinoma.    Has transverse myelitis and takes Ultram  as needed for flare up.  She is followed by GI as needed for IBS.    She is also complaining of anxiety. She has been on xanax  since 1982 and wants to stop this medication. We have tampered her down to Xanax  0.25 mg BID and added Remeron  15 mg. However, reports she has had a great deal of stress with her husband and has been getting panic attacks. Would like to try to increase back to TID from BID.  Hypertension This is a chronic problem. The current episode started more than 1 year ago. The problem has been waxing and waning since onset. The problem is uncontrolled. Pertinent negatives include no anxiety or peripheral edema. Risk factors for coronary artery disease include obesity, sedentary lifestyle and post-menopausal state. The current treatment provides moderate improvement.  Gastroesophageal Reflux She complains of belching. She reports no nausea. This is a chronic problem. The current episode started more than 1 year ago. The problem occurs occasionally. The symptoms are aggravated by certain foods. She has tried an antacid and a PPI for the symptoms. The treatment provided moderate relief.  Anxiety Presents for follow-up visit. Symptoms include excessive worry, nervous/anxious behavior and restlessness. Patient reports no nausea. Symptoms occur occasionally. The severity of symptoms is moderate.    Arthritis Presents for follow-up visit. She complains of pain and stiffness. Affected locations include the left knee, right knee and neck.  Her pain is at a severity of 5/10.  Urinary Frequency  This is a recurrent problem. The current episode started more than 1 month ago. The problem occurs intermittently. The patient is experiencing no pain. Associated symptoms include frequency and urgency. Pertinent negatives include no hematuria, nausea or vomiting. She has tried increased fluids for the symptoms.    Current opioids rx- Ultram  50 mg and xanax  0.25 mg # meds rx- 20 and 90 Effectiveness of current meds-stable  Adverse reactions from pain meds-none Morphine  equivalent- 10  Pill count performed-No Last drug screen - 05/15/23 ( high risk q56m, moderate risk q49m, low risk yearly ) Urine drug screen today- Yes Was the [facility] reviewed- yes  If yes were their any concerning findings? - none Pain contract signed on:05/15/23    Review of Systems  Gastrointestinal:  Negative for nausea and vomiting.  Genitourinary:  Positive for frequency and urgency. Negative for hematuria.  Musculoskeletal:  Positive for stiffness.  Psychiatric/Behavioral:  The patient is nervous/anxious.   All other systems reviewed and are negative.      Objective:   Physical Exam Vitals reviewed.  Constitutional:      General: She is not in acute distress.    Appearance: She is well-developed.  HENT:     Head: Normocephalic and atraumatic.  Eyes:     Pupils: Pupils are equal, round, and reactive to light.  Neck:     Thyroid : No thyromegaly.  Cardiovascular:     Rate and Rhythm: Normal rate and regular rhythm.     Heart sounds: Normal heart  sounds. No murmur heard. Pulmonary:     Effort: Pulmonary effort is normal. No respiratory distress.     Breath sounds: Normal breath sounds. No wheezing.  Abdominal:     General: Bowel sounds are normal. There is no distension.     Palpations: Abdomen is soft.     Tenderness: There is no abdominal tenderness.  Musculoskeletal:        General: Tenderness (left knee with flexion) present. Normal range of  motion.     Cervical back: Normal range of motion and neck supple.     Comments: Pain in right shoulder with abduction, unable to fully abduct   Skin:    General: Skin is warm and dry.  Neurological:     Mental Status: She is alert and oriented to person, place, and time.     Cranial Nerves: No cranial nerve deficit.     Motor: Weakness (using cane) present.     Gait: Gait abnormal.     Deep Tendon Reflexes: Reflexes are normal and symmetric.  Psychiatric:        Behavior: Behavior normal.        Thought Content: Thought content normal.        Judgment: Judgment normal.       BP (!) 167/71   Pulse 67   Temp (!) 97.5 F (36.4 C) (Temporal)   Ht 5' 3 (1.6 m)   Wt 154 lb 3.2 oz (69.9 kg)   BMI 27.32 kg/m      Assessment & Plan:   Amy Black comes in today with chief complaint of Medical Management of Chronic Issues and Urinary Frequency   Diagnosis and orders addressed:  1. GAD (generalized anxiety disorder) - ALPRAZolam  (XANAX ) 0.25 MG tablet; Take 1 tablet (0.25 mg total) by mouth 3 (three) times daily as needed for anxiety.  Dispense: 75 tablet; Refill: 2  2. Primary hypertension (Primary)  3. Gastroesophageal reflux disease without esophagitis  4. Vitamin D  deficiency  5. Urinary frequency - Urinalysis, Complete - Urine Culture  6. Neck pain - traMADol  (ULTRAM ) 50 MG tablet; Take 1 tablet (50 mg total) by mouth every 12 (twelve) hours as needed. TAKE 1 TABLET EVERY 12 HOURS AS NEEDED  Dispense: 20 tablet; Refill: 1  7. Transverse myelitis (HCC) - traMADol  (ULTRAM ) 50 MG tablet; Take 1 tablet (50 mg total) by mouth every 12 (twelve) hours as needed. TAKE 1 TABLET EVERY 12 HOURS AS NEEDED  Dispense: 20 tablet; Refill: 1  8. Primary osteoarthritis of both knee - traMADol  (ULTRAM ) 50 MG tablet; Take 1 tablet (50 mg total) by mouth every 12 (twelve) hours as needed. TAKE 1 TABLET EVERY 12 HOURS AS NEEDED  Dispense: 20 tablet; Refill: 1  Labs reviewed from  Oncologists  Patient reviewed in [place] controlled database, no flags noted. Contract and drug screen are up to date.  Continue xanax , risks discussed. Will increase to #75. Take as needed.   Ultram  as needed  Health Maintenance reviewed Diet and exercise encouraged  Follow up plan: 3 months     Bari Learn, FNP

## 2023-12-07 ENCOUNTER — Ambulatory Visit: Payer: Self-pay | Admitting: Family

## 2023-12-09 LAB — URINE CULTURE

## 2023-12-10 ENCOUNTER — Other Ambulatory Visit: Payer: Self-pay | Admitting: Cardiology

## 2023-12-10 DIAGNOSIS — R031 Nonspecific low blood-pressure reading: Secondary | ICD-10-CM

## 2023-12-11 ENCOUNTER — Telehealth: Payer: Self-pay | Admitting: Cardiology

## 2023-12-11 DIAGNOSIS — R031 Nonspecific low blood-pressure reading: Secondary | ICD-10-CM

## 2023-12-11 MED ORDER — ISOSORBIDE MONONITRATE ER 120 MG PO TB24: 120.0000 mg | ORAL_TABLET | Freq: Every day | ORAL | 2 refills | Status: AC

## 2023-12-11 NOTE — Telephone Encounter (Signed)
*  STAT* If patient is at the pharmacy, call can be transferred to refill team.   1. Which medications need to be refilled? (please list name of each medication and dose if known)   isosorbide  mononitrate (IMDUR ) 120 MG 24 hr tablet     2. Would you like to learn more about the convenience, safety, & potential cost savings by using the [facility]? No    3. Are you open to using the [facility] (Type [facility]. No   4. Which pharmacy/location (including street and city if local pharmacy) is medication to be sent to? [facility] [place], [place] - [address]     5. Do they need a 30 day or 90 day supply? 90 day

## 2023-12-11 NOTE — Telephone Encounter (Signed)
 Pt's medication was sent to pt's pharmacy as requested. Confirmation received.

## 2023-12-13 ENCOUNTER — Other Ambulatory Visit

## 2023-12-13 ENCOUNTER — Ambulatory Visit: Admitting: Medical Oncology

## 2023-12-19 ENCOUNTER — Inpatient Hospital Stay

## 2023-12-19 ENCOUNTER — Ambulatory Visit: Admitting: Medical Oncology

## 2023-12-20 ENCOUNTER — Ambulatory Visit

## 2024-01-07 ENCOUNTER — Inpatient Hospital Stay

## 2024-01-07 ENCOUNTER — Inpatient Hospital Stay: Admitting: Medical Oncology

## 2024-01-09 ENCOUNTER — Telehealth: Payer: Self-pay | Admitting: Family

## 2024-01-09 DIAGNOSIS — Z0279 Encounter for issue of other medical certificate: Secondary | ICD-10-CM

## 2024-01-09 NOTE — Telephone Encounter (Signed)
 Daughter dropped off fmla forms to be completed and signed.  Form Fee Paid? (Y/N)       yes'     If NO, form is placed on front office manager desk to hold until payment received. If YES, then form will be placed in the RX/HH Nurse Coordinators box for completion.  Form will not be processed until payment is received

## 2024-01-10 ENCOUNTER — Inpatient Hospital Stay: Attending: Hematology & Oncology

## 2024-01-10 ENCOUNTER — Encounter: Payer: Self-pay | Admitting: Medical Oncology

## 2024-01-10 ENCOUNTER — Inpatient Hospital Stay: Admitting: Medical Oncology

## 2024-01-10 ENCOUNTER — Ambulatory Visit (HOSPITAL_BASED_OUTPATIENT_CLINIC_OR_DEPARTMENT_OTHER)
Admission: RE | Admit: 2024-01-10 | Discharge: 2024-01-10 | Disposition: A | Source: Ambulatory Visit | Attending: Medical Oncology | Admitting: Medical Oncology

## 2024-01-10 VITALS — BP 123/77 | HR 60 | Temp 98.0°F | Resp 18 | Ht 63.0 in | Wt 150.0 lb

## 2024-01-10 DIAGNOSIS — R059 Cough, unspecified: Secondary | ICD-10-CM

## 2024-01-10 DIAGNOSIS — R61 Generalized hyperhidrosis: Secondary | ICD-10-CM | POA: Insufficient documentation

## 2024-01-10 DIAGNOSIS — R634 Abnormal weight loss: Secondary | ICD-10-CM | POA: Diagnosis not present

## 2024-01-10 DIAGNOSIS — E639 Nutritional deficiency, unspecified: Secondary | ICD-10-CM | POA: Diagnosis not present

## 2024-01-10 DIAGNOSIS — D509 Iron deficiency anemia, unspecified: Secondary | ICD-10-CM | POA: Insufficient documentation

## 2024-01-10 DIAGNOSIS — D500 Iron deficiency anemia secondary to blood loss (chronic): Secondary | ICD-10-CM

## 2024-01-10 DIAGNOSIS — D75839 Thrombocytosis, unspecified: Secondary | ICD-10-CM | POA: Diagnosis present

## 2024-01-10 LAB — IRON AND IRON BINDING CAPACITY (CC-WL,HP ONLY)
Iron: 64 ug/dL (ref 28–170)
Saturation Ratios: 25 % (ref 10.4–31.8)
TIBC: 256 ug/dL (ref 250–450)
UIBC: 192 ug/dL

## 2024-01-10 LAB — CBC WITH DIFFERENTIAL (CANCER CENTER ONLY)
Abs Immature Granulocytes: 0.02 K/uL (ref 0.00–0.07)
Basophils Absolute: 0.1 K/uL (ref 0.0–0.1)
Basophils Relative: 1 %
Eosinophils Absolute: 0.1 K/uL (ref 0.0–0.5)
Eosinophils Relative: 3 %
HCT: 33.4 % — ABNORMAL LOW (ref 36.0–46.0)
Hemoglobin: 11.1 g/dL — ABNORMAL LOW (ref 12.0–15.0)
Immature Granulocytes: 0 %
Lymphocytes Relative: 36 %
Lymphs Abs: 1.9 K/uL (ref 0.7–4.0)
MCH: 31.1 pg (ref 26.0–34.0)
MCHC: 33.2 g/dL (ref 30.0–36.0)
MCV: 93.6 fL (ref 80.0–100.0)
Monocytes Absolute: 0.6 K/uL (ref 0.1–1.0)
Monocytes Relative: 12 %
Neutro Abs: 2.5 K/uL (ref 1.7–7.7)
Neutrophils Relative %: 48 %
Platelet Count: 375 K/uL (ref 150–400)
RBC: 3.57 MIL/uL — ABNORMAL LOW (ref 3.87–5.11)
RDW: 13 % (ref 11.5–15.5)
WBC Count: 5.3 K/uL (ref 4.0–10.5)
nRBC: 0 % (ref 0.0–0.2)

## 2024-01-10 LAB — CMP (CANCER CENTER ONLY)
ALT: 8 U/L (ref 0–44)
AST: 18 U/L (ref 15–41)
Albumin: 4.4 g/dL (ref 3.5–5.0)
Alkaline Phosphatase: 105 U/L (ref 38–126)
Anion gap: 8 (ref 5–15)
BUN: 12 mg/dL (ref 8–23)
CO2: 27 mmol/L (ref 22–32)
Calcium: 9.9 mg/dL (ref 8.9–10.3)
Chloride: 97 mmol/L — ABNORMAL LOW (ref 98–111)
Creatinine: 0.92 mg/dL (ref 0.44–1.00)
GFR, Estimated: >60 mL/min (ref >60)
Glucose, Bld: 89 mg/dL (ref 70–99)
Potassium: 4.6 mmol/L (ref 3.5–5.1)
Sodium: 132 mmol/L — ABNORMAL LOW (ref 135–145)
Total Bilirubin: 0.3 mg/dL (ref 0.0–1.2)
Total Protein: 7.2 g/dL (ref 6.5–8.1)

## 2024-01-10 LAB — FOLATE: Folate: 15.9 ng/mL (ref >5.9)

## 2024-01-10 LAB — FERRITIN: Ferritin: 161 ng/mL (ref 11–307)

## 2024-01-10 LAB — VITAMIN B12: Vitamin B-12: 529 pg/mL (ref 180–914)

## 2024-01-10 NOTE — Progress Notes (Signed)
 Hematology and Oncology Follow Up Visit  Amy Black [id number] [birth year] 83 y.o. 01/10/2024   Principle Diagnosis:  Transient thrombocytosis History transverse myelitis History of adenosquamous carcinoma of unknown primary Iron  def anemia   Current Therapy:        Fusion Plus -- daily   Interim History:  Amy Black is here today with her son for follow-up  At our last visit on 08/17/2022 we discussed CT imaging given her symptoms of abdominal pain, unintentional weight loss, etc. This was ordered but has not been performed. She also was going to discuss her fatigue and dizziness with her cardiologist which she has done.  She reports that she took her premeds I gave her at her last visit for a cardiac scan she had done with contrast. She tolerated this well and didn't have any trouble with the IV contrast or premeds.   Today she states that she is doing ok. She continues to have some night sweats and unintentional weight loss. She denies hemoptysis but does mention a dry chronic cough and some pain on occasion over her left posterior shoulder blade. No chest pain or jaw pain.   No falls or syncope reported.  No swelling, tenderness, numbness or tingling in her extremities.  No fever, chills, n/v, cough, rash, dizziness, SOB, chest pain, palpitations, abdominal pain or changes in bowel or bladder habits.  She does have night sweats  Appetite and hydration are good.   Wt Readings from Last 3 Encounters:  01/10/24 150 lb (68 kg)  12/06/23 154 lb 3.2 oz (69.9 kg)  11/07/23 150 lb (68 kg)   ECOG Performance Status: 1 - Symptomatic but completely ambulatory  Medications:  Allergies as of 01/10/2024       Reactions   Iodine Itching   Ivp Dye [iodinated Contrast Media] Itching   Amlodipine  Other (See Comments)   headaches   Lexapro  [escitalopram  Oxalate] Other (See Comments)   Drunk, High feeling   Tizanidine  Other (See Comments)   Other reaction(s):Drunk feeling the next  day       Cymbalta  [duloxetine  Hcl] Other (See Comments)   sleepiness   Duloxetine  Other (See Comments)   Other reaction(s): sleepy   Gabapentin  Other (See Comments)   sleepiness   Zanaflex  [tizanidine  Hcl] Other (See Comments)   Very drunk feeling next day        Medication List        Accurate as of January 10, 2024  2:55 PM. If you have any questions, ask your nurse or doctor.          ALPRAZolam  0.25 MG tablet Commonly known as: XANAX  Take 1 tablet (0.25 mg total) by mouth 3 (three) times daily as needed for anxiety.   cetirizine  10 MG tablet Commonly known as: ZYRTEC  TAKE ONE TABLET BY MOUTH DAILY   diphenhydrAMINE  50 MG tablet Commonly known as: BENADRYL  Take 1 tablet (50 mg total) by mouth once for 1 dose. Take 1 hour before CT   hydrOXYzine  10 MG tablet Commonly known as: ATARAX  Take 1 tablet (10 mg total) by mouth 2 (two) times daily.   Hyoscyamine  Sulfate SL 0.125 MG Subl Commonly known as: Levsin /SL Place 1 tablet (0.125 mg total) under the tongue every 6 (six) hours as needed (abd cramping and diarrhea).   isosorbide  mononitrate 120 MG 24 hr tablet Commonly known as: IMDUR  Take 1 tablet (120 mg total) by mouth daily.   metoprolol  tartrate 25 MG tablet Commonly known as: LOPRESSOR  Take 1 tablet (  25 mg total) by mouth 2 (two) times daily.   mirtazapine  15 MG tablet Commonly known as: REMERON  Take 1 tablet (15 mg total) by mouth at bedtime.   nitroGLYCERIN  0.4 MG SL tablet Commonly known as: NITROSTAT  DISSOLVE ONE TABLET UNDER TONGUE EVERY 5 MINUTES UP TO 3 DOSES AS NEEDED FOR CHEST PAIN   omeprazole  40 MG capsule Commonly known as: PRILOSEC Take 1 capsule (40 mg total) by mouth daily.   traMADol  50 MG tablet Commonly known as: ULTRAM  Take 1 tablet (50 mg total) by mouth every 12 (twelve) hours as needed. TAKE 1 TABLET EVERY 12 HOURS AS NEEDED   triamterene -hydrochlorothiazide  37.5-25 MG capsule Commonly known as: DYAZIDE  TAKE ONE  CAPSULE BY MOUTH 3 TIMES PER WEEK   verapamil  180 MG CR tablet Commonly known as: CALAN -SR TAKE ONE TABLET AT BEDTIME        Allergies:  Allergies  Allergen Reactions   Iodine Itching   Ivp Dye [Iodinated Contrast Media] Itching   Amlodipine  Other (See Comments)    headaches   Lexapro  [Escitalopram  Oxalate] Other (See Comments)    Drunk, High feeling   Tizanidine  Other (See Comments)    Other reaction(s):Drunk feeling the next day       Cymbalta  [Duloxetine  Hcl] Other (See Comments)    sleepiness   Duloxetine  Other (See Comments)    Other reaction(s): sleepy   Gabapentin  Other (See Comments)    sleepiness   Zanaflex  [Tizanidine  Hcl] Other (See Comments)    Very drunk feeling next day    Past Medical History, Surgical history, Social history, and Family History were reviewed and updated.  Review of Systems: All other 10 point review of systems is negative.   Physical Exam:  height is 5' 3 (1.6 m) and weight is 150 lb (68 kg). Her oral temperature is 98 F (36.7 C). Her blood pressure is 123/77 and her pulse is 60. Her respiration is 18 and oxygen saturation is 99%.   Wt Readings from Last 3 Encounters:  01/10/24 150 lb (68 kg)  12/06/23 154 lb 3.2 oz (69.9 kg)  11/07/23 150 lb (68 kg)   Constitutional: AA X 3 Ocular: Sclerae unicteric, pupils equal, round and reactive to light Ear-nose-throat: Oropharynx clear, dentition fair Lymphatic: No cervical or supraclavicular adenopathy Lungs no rales or rhonchi, good excursion bilaterally Heart regular rate and rhythm, no murmur appreciated Abd soft MSK no focal spinal tenderness, no joint edema Neuro: non-focal, well-oriented, appropriate affect Skin: No rash  Lab Results  Component Value Date   WBC 5.3 01/10/2024   HGB 11.1 (L) 01/10/2024   HCT 33.4 (L) 01/10/2024   MCV 93.6 01/10/2024   PLT 375 01/10/2024   Lab Results  Component Value Date   FERRITIN 204 09/13/2023   IRON  92 09/13/2023   TIBC 239 (L)  09/13/2023   UIBC 147 09/13/2023   IRONPCTSAT 38 (H) 09/13/2023   Lab Results  Component Value Date   RETICCTPCT 1.7 06/13/2023   RBC 3.57 (L) 01/10/2024   No results found for: KPAFRELGTCHN, LAMBDASER, KAPLAMBRATIO No results found for: IGGSERUM, IGA, IGMSERUM No results found for: TOTALPROTELP, ALBUMINELP, A1GS, A2GS, BETS, BETA2SER, GAMS, MSPIKE, SPEI   Chemistry      Component Value Date/Time   NA 133 (L) 09/13/2023 1402   NA 136 05/15/2023 1434   NA 138 01/28/2016 1303   K 4.9 09/13/2023 1402   K 4.2 07/28/2016 1243   K 4.2 01/28/2016 1303   CL 98 09/13/2023 1402  CL 98 07/28/2016 1243   CL 101 07/20/2014 1131   CO2 27 09/13/2023 1402   CO2 29 07/28/2016 1243   CO2 30 (H) 01/28/2016 1303   BUN 11 09/13/2023 1402   BUN 13 05/15/2023 1434   BUN 10.0 01/28/2016 1303   CREATININE 0.96 09/13/2023 1402   CREATININE 0.94 07/28/2016 1243   CREATININE 0.8 01/28/2016 1303      Component Value Date/Time   CALCIUM  9.4 09/13/2023 1402   CALCIUM  9.9 07/28/2016 1243   CALCIUM  10.0 01/28/2016 1303   ALKPHOS 80 09/13/2023 1402   ALKPHOS 105 07/28/2016 1243   ALKPHOS 123 01/28/2016 1303   AST 18 09/13/2023 1402   AST 13 01/28/2016 1303   ALT 12 09/13/2023 1402   ALT 12 01/28/2016 1303   BILITOT 0.3 09/13/2023 1402   BILITOT 0.51 01/28/2016 1303     Encounter Diagnoses  Name Primary?   Iron  deficiency anemia due to chronic blood loss Yes   Nutritional deficiency    Cough, unspecified type    Unintentional weight loss    Night sweats    Impression and Plan: Amy Black is a very pleasant 83 yo African American female with history of transient thrombocytosis and also history of adenosquamous carcinoma with unknown primary.   CT scan from 03/08/2023 fortunately did not show any evidence of metastatic disease of the chest, abdomen or pelvis. Will get an updated chest x ray today.   Last TSH 11 months ago was normal  CBC shows a Hgb of 11.1  and normal platelets of 375 Iron  studies and B12/folate pending  RTC 3 months APP, labs (CBC w/, CMP, iron , ferritin, B12, folate-[place]   Lauraine CHRISTELLA Dais, PA-C 12/4/20252:55 PM

## 2024-01-15 ENCOUNTER — Ambulatory Visit: Payer: Self-pay | Admitting: Medical Oncology

## 2024-01-15 ENCOUNTER — Other Ambulatory Visit: Payer: Self-pay | Admitting: Medical Oncology

## 2024-01-15 DIAGNOSIS — R9389 Abnormal findings on diagnostic imaging of other specified body structures: Secondary | ICD-10-CM

## 2024-01-15 DIAGNOSIS — M25512 Pain in left shoulder: Secondary | ICD-10-CM

## 2024-01-15 NOTE — Telephone Encounter (Signed)
 PCP completed and signed FMLA forms. They have been faxed to [employer] at fax number [phone]. Patient has been contacted and informed they are complete.

## 2024-01-15 NOTE — Telephone Encounter (Signed)
 As noted below by Lauraine Dais, PA, I informed the patient that the Folate, Vitamin B 12 and iron  levels all look good. She verbalized understanding.

## 2024-01-15 NOTE — Telephone Encounter (Signed)
-----   Message from Lauraine CHRISTELLA Dais sent at 01/15/2024  1:49 PM EST ----- Folate, B12, iron  all look good ----- Message ----- From: Franchot Lauraine CHRISTELLA, NP Sent: 01/14/2024  12:46 PM EST To: Lauraine CHRISTELLA Dais, PA-C   ----- Message ----- From: Dais Lauraine CHRISTELLA DEVONNA Sent: 01/14/2024  12:30 PM EST To: Lauraine CHRISTELLA Franchot, NP   ----- Message ----- From: Rebecka, [facility] [place] Sent: 01/10/2024   2:31 PM EST To: Lauraine CHRISTELLA Dais, PA-C

## 2024-01-15 NOTE — Telephone Encounter (Signed)
 As noted below by Lauraine [facility], I informed her that the chest looks good on X-ray; however, there is an area of the left shoulder that they would like to take an additional look at. I have already placed the order. You can call or stop by [facility] and schedule an appointment. Phone number given to Imaging. She verbalized understanding.

## 2024-01-15 NOTE — Telephone Encounter (Signed)
-----   Message from Lauraine CHRISTELLA Dais sent at 01/15/2024  1:33 PM EST ----- Chest looks good on x ray however there is an area of the left shoulder that they would like to take an additional look at. She will need to go get a left shoulder x ray (I have placed this order in  the system). She can stop by [facility] whenever they are open to have this done ----- Message ----- From: Interface, Rad Results In Sent: 01/15/2024   1:25 PM EST To: Lauraine CHRISTELLA Dais, PA-C

## 2024-01-17 ENCOUNTER — Ambulatory Visit (HOSPITAL_BASED_OUTPATIENT_CLINIC_OR_DEPARTMENT_OTHER)
Admission: RE | Admit: 2024-01-17 | Discharge: 2024-01-17 | Disposition: A | Discharge status: Home / Self Care | Source: Ambulatory Visit | Attending: Medical Oncology | Admitting: Medical Oncology

## 2024-01-17 DIAGNOSIS — R9389 Abnormal findings on diagnostic imaging of other specified body structures: Secondary | ICD-10-CM | POA: Insufficient documentation

## 2024-01-17 DIAGNOSIS — M25512 Pain in left shoulder: Secondary | ICD-10-CM | POA: Insufficient documentation

## 2024-01-24 ENCOUNTER — Telehealth: Payer: Self-pay

## 2024-01-24 NOTE — Telephone Encounter (Signed)
 Patient was identified as falling into the [place] Measure - Diabetes.   Patient was: Attribution and/or data issue.  Validation/Investigation needed.  Explanation:  Patient is leaving [facility] and has an estalish care visit at

## 2024-01-24 NOTE — Telephone Encounter (Signed)
 Patient was identified as falling into the [place] Measure - Diabetes.   Patient was: Left voicemail to schedule with primary care provider.

## 2024-01-28 ENCOUNTER — Ambulatory Visit: Payer: Self-pay | Admitting: Medical Oncology

## 2024-01-28 ENCOUNTER — Other Ambulatory Visit: Payer: Self-pay | Admitting: Nurse Practitioner

## 2024-01-28 DIAGNOSIS — I10 Essential (primary) hypertension: Secondary | ICD-10-CM

## 2024-01-28 NOTE — Telephone Encounter (Signed)
-----   Message from Amy Black sent at 01/28/2024  9:36 AM EST ----- Fantastic news regarding her shoulder x ray- the area of concern is not see on the dedicated film. Other than some mild arthritis there is no concern regarding her x rays

## 2024-01-28 NOTE — Telephone Encounter (Signed)
Called and informed patient of results, patient verbalized understanding and denies any questions or concerns at this time.  ? ?

## 2024-02-19 ENCOUNTER — Ambulatory Visit

## 2024-02-20 ENCOUNTER — Ambulatory Visit

## 2024-02-27 ENCOUNTER — Ambulatory Visit

## 2024-02-28 ENCOUNTER — Ambulatory Visit

## 2024-02-29 ENCOUNTER — Other Ambulatory Visit: Payer: Self-pay | Admitting: Family

## 2024-02-29 DIAGNOSIS — G373 Acute transverse myelitis in demyelinating disease of central nervous system: Secondary | ICD-10-CM

## 2024-02-29 DIAGNOSIS — M542 Cervicalgia: Secondary | ICD-10-CM

## 2024-02-29 DIAGNOSIS — M170 Bilateral primary osteoarthritis of knee: Secondary | ICD-10-CM

## 2024-04-02 ENCOUNTER — Ambulatory Visit: Admitting: Physician Assistant

## 2024-04-10 ENCOUNTER — Inpatient Hospital Stay: Attending: Hematology & Oncology

## 2024-04-10 ENCOUNTER — Inpatient Hospital Stay: Admitting: Medical Oncology

## 2024-10-31 ENCOUNTER — Ambulatory Visit: Payer: Self-pay
